# Patient Record
Sex: Female | Born: 1945 | Race: White | Hispanic: No | Smoking: Former smoker
Health system: Southern US, Community
[De-identification: ages and names within clinical notes are randomized; demographics above are authoritative.]

## PROBLEM LIST (undated history)

## (undated) DIAGNOSIS — E039 Hypothyroidism, unspecified: Secondary | ICD-10-CM

## (undated) DIAGNOSIS — E785 Hyperlipidemia, unspecified: Secondary | ICD-10-CM

## (undated) DIAGNOSIS — N8110 Cystocele, unspecified: Secondary | ICD-10-CM

## (undated) DIAGNOSIS — M654 Radial styloid tenosynovitis [de Quervain]: Secondary | ICD-10-CM

## (undated) DIAGNOSIS — J209 Acute bronchitis, unspecified: Secondary | ICD-10-CM

## (undated) DIAGNOSIS — I2510 Atherosclerotic heart disease of native coronary artery without angina pectoris: Secondary | ICD-10-CM

## (undated) DIAGNOSIS — F3289 Other specified depressive episodes: Secondary | ICD-10-CM

## (undated) DIAGNOSIS — R0989 Other specified symptoms and signs involving the circulatory and respiratory systems: Secondary | ICD-10-CM

## (undated) DIAGNOSIS — I10 Essential (primary) hypertension: Secondary | ICD-10-CM

## (undated) DIAGNOSIS — F411 Generalized anxiety disorder: Secondary | ICD-10-CM

## (undated) DIAGNOSIS — F329 Major depressive disorder, single episode, unspecified: Secondary | ICD-10-CM

## (undated) HISTORY — DX: Essential (primary) hypertension: I10

## (undated) HISTORY — DX: Major depressive disorder, single episode, unspecified: F32.9

## (undated) HISTORY — DX: Atherosclerotic heart disease of native coronary artery without angina pectoris: I25.10

## (undated) HISTORY — DX: Hyperlipidemia, unspecified: E78.5

## (undated) HISTORY — DX: Radial styloid tenosynovitis (de quervain): M65.4

## (undated) HISTORY — DX: Generalized anxiety disorder: F41.1

## (undated) HISTORY — DX: Other specified depressive episodes: F32.89

## (undated) HISTORY — DX: Other specified symptoms and signs involving the circulatory and respiratory systems: R09.89

## (undated) HISTORY — DX: Hypothyroidism, unspecified: E03.9

## (undated) HISTORY — DX: Cystocele, unspecified: N81.10

## (undated) HISTORY — PX: MOUTH SURGERY: SHX715

## (undated) HISTORY — DX: Acute bronchitis, unspecified: J20.9

---

## 1992-05-21 HISTORY — PX: TOTAL VAGINAL HYSTERECTOMY: SHX2548

## 1997-05-21 HISTORY — PX: NOSE SURGERY: SHX723

## 1998-04-07 ENCOUNTER — Ambulatory Visit (HOSPITAL_BASED_OUTPATIENT_CLINIC_OR_DEPARTMENT_OTHER): Admission: RE | Admit: 1998-04-07 | Discharge: 1998-04-07 | Payer: Self-pay | Admitting: *Deleted

## 1999-05-04 ENCOUNTER — Other Ambulatory Visit: Admission: RE | Admit: 1999-05-04 | Discharge: 1999-05-04 | Payer: Self-pay | Admitting: Family Medicine

## 2000-08-06 ENCOUNTER — Encounter: Payer: Self-pay | Admitting: *Deleted

## 2000-08-06 ENCOUNTER — Inpatient Hospital Stay (HOSPITAL_COMMUNITY): Admission: EM | Admit: 2000-08-06 | Discharge: 2000-08-08 | Payer: Self-pay | Admitting: Emergency Medicine

## 2000-09-12 ENCOUNTER — Other Ambulatory Visit: Admission: RE | Admit: 2000-09-12 | Discharge: 2000-09-12 | Payer: Self-pay | Admitting: Internal Medicine

## 2000-09-16 ENCOUNTER — Encounter: Admission: RE | Admit: 2000-09-16 | Discharge: 2000-09-16 | Payer: Self-pay | Admitting: Internal Medicine

## 2000-09-16 ENCOUNTER — Encounter: Payer: Self-pay | Admitting: Internal Medicine

## 2000-11-01 ENCOUNTER — Encounter: Payer: Self-pay | Admitting: Internal Medicine

## 2000-11-01 ENCOUNTER — Encounter: Admission: RE | Admit: 2000-11-01 | Discharge: 2000-11-01 | Payer: Self-pay | Admitting: Internal Medicine

## 2001-01-02 ENCOUNTER — Ambulatory Visit (HOSPITAL_COMMUNITY): Admission: RE | Admit: 2001-01-02 | Discharge: 2001-01-02 | Payer: Self-pay | Admitting: Obstetrics and Gynecology

## 2001-01-02 ENCOUNTER — Encounter: Payer: Self-pay | Admitting: Obstetrics and Gynecology

## 2001-01-27 ENCOUNTER — Encounter: Payer: Self-pay | Admitting: Obstetrics and Gynecology

## 2001-01-27 ENCOUNTER — Ambulatory Visit (HOSPITAL_COMMUNITY): Admission: RE | Admit: 2001-01-27 | Discharge: 2001-01-27 | Payer: Self-pay | Admitting: Obstetrics and Gynecology

## 2001-04-30 ENCOUNTER — Encounter: Payer: Self-pay | Admitting: Obstetrics and Gynecology

## 2001-04-30 ENCOUNTER — Ambulatory Visit (HOSPITAL_COMMUNITY): Admission: RE | Admit: 2001-04-30 | Discharge: 2001-04-30 | Payer: Self-pay | Admitting: Obstetrics and Gynecology

## 2004-07-27 ENCOUNTER — Ambulatory Visit: Payer: Self-pay | Admitting: *Deleted

## 2004-07-27 ENCOUNTER — Inpatient Hospital Stay (HOSPITAL_COMMUNITY): Admission: EM | Admit: 2004-07-27 | Discharge: 2004-07-29 | Payer: Self-pay | Admitting: Emergency Medicine

## 2004-08-01 ENCOUNTER — Ambulatory Visit: Payer: Self-pay | Admitting: Internal Medicine

## 2004-08-08 ENCOUNTER — Ambulatory Visit: Payer: Self-pay | Admitting: *Deleted

## 2004-09-08 ENCOUNTER — Ambulatory Visit: Payer: Self-pay | Admitting: *Deleted

## 2004-10-26 ENCOUNTER — Ambulatory Visit: Payer: Self-pay | Admitting: *Deleted

## 2004-12-06 ENCOUNTER — Ambulatory Visit: Payer: Self-pay | Admitting: *Deleted

## 2005-04-26 ENCOUNTER — Ambulatory Visit: Payer: Self-pay | Admitting: *Deleted

## 2005-06-12 ENCOUNTER — Ambulatory Visit: Payer: Self-pay | Admitting: *Deleted

## 2005-09-10 ENCOUNTER — Ambulatory Visit: Payer: Self-pay | Admitting: Internal Medicine

## 2005-09-17 ENCOUNTER — Ambulatory Visit: Payer: Self-pay | Admitting: Internal Medicine

## 2005-10-23 ENCOUNTER — Ambulatory Visit: Payer: Self-pay | Admitting: Internal Medicine

## 2005-11-26 ENCOUNTER — Ambulatory Visit: Payer: Self-pay | Admitting: Internal Medicine

## 2006-01-29 ENCOUNTER — Ambulatory Visit: Payer: Self-pay | Admitting: *Deleted

## 2006-01-30 ENCOUNTER — Ambulatory Visit: Payer: Self-pay | Admitting: *Deleted

## 2006-02-27 ENCOUNTER — Ambulatory Visit: Payer: Self-pay | Admitting: Internal Medicine

## 2006-07-12 ENCOUNTER — Ambulatory Visit: Payer: Self-pay | Admitting: Internal Medicine

## 2006-09-10 ENCOUNTER — Ambulatory Visit: Payer: Self-pay | Admitting: *Deleted

## 2006-09-10 LAB — CONVERTED CEMR LAB
ALT: 24 units/L (ref 0–40)
AST: 27 units/L (ref 0–37)
Albumin: 3.6 g/dL (ref 3.5–5.2)
Alkaline Phosphatase: 80 units/L (ref 39–117)
Bilirubin, Direct: 0.1 mg/dL (ref 0.0–0.3)
Cholesterol: 142 mg/dL (ref 0–200)
HDL: 52.5 mg/dL (ref >39.0)
LDL Cholesterol: 72 mg/dL (ref 0–99)
Total Bilirubin: 0.8 mg/dL (ref 0.3–1.2)
Total CHOL/HDL Ratio: 2.7
Total Protein: 6.4 g/dL (ref 6.0–8.3)
Triglycerides: 89 mg/dL (ref 0–149)
VLDL: 18 mg/dL (ref 0–40)

## 2006-09-17 ENCOUNTER — Ambulatory Visit: Payer: Self-pay | Admitting: *Deleted

## 2006-11-01 ENCOUNTER — Ambulatory Visit: Payer: Self-pay | Admitting: *Deleted

## 2006-11-01 LAB — CONVERTED CEMR LAB
ALT: 23 units/L (ref 0–40)
AST: 24 units/L (ref 0–37)
Albumin: 3.5 g/dL (ref 3.5–5.2)
Alkaline Phosphatase: 91 units/L (ref 39–117)
Bilirubin, Direct: 0.1 mg/dL (ref 0.0–0.3)
Cholesterol: 154 mg/dL (ref 0–200)
HDL: 49.9 mg/dL (ref >39.0)
LDL Cholesterol: 89 mg/dL (ref 0–99)
Total Bilirubin: 0.8 mg/dL (ref 0.3–1.2)
Total CHOL/HDL Ratio: 3.1
Total Protein: 6.3 g/dL (ref 6.0–8.3)
Triglycerides: 75 mg/dL (ref 0–149)
VLDL: 15 mg/dL (ref 0–40)

## 2007-01-21 ENCOUNTER — Ambulatory Visit: Payer: Self-pay | Admitting: Cardiovascular Disease

## 2007-01-21 LAB — CONVERTED CEMR LAB
ALT: 31 units/L (ref 0–35)
AST: 38 units/L — ABNORMAL HIGH (ref 0–37)
Albumin: 3.6 g/dL (ref 3.5–5.2)
Alkaline Phosphatase: 78 units/L (ref 39–117)
Bilirubin, Direct: 0.1 mg/dL (ref 0.0–0.3)
Cholesterol: 141 mg/dL (ref 0–200)
HDL: 39.2 mg/dL (ref >39.0)
LDL Cholesterol: 73 mg/dL (ref 0–99)
Total Bilirubin: 0.7 mg/dL (ref 0.3–1.2)
Total CHOL/HDL Ratio: 3.6
Total Protein: 6.5 g/dL (ref 6.0–8.3)
Triglycerides: 142 mg/dL (ref 0–149)
VLDL: 28 mg/dL (ref 0–40)

## 2007-01-23 ENCOUNTER — Ambulatory Visit: Payer: Self-pay | Admitting: Cardiovascular Disease

## 2007-01-31 ENCOUNTER — Ambulatory Visit: Payer: Self-pay

## 2007-02-11 ENCOUNTER — Ambulatory Visit: Payer: Self-pay | Admitting: Internal Medicine

## 2007-02-11 DIAGNOSIS — I25119 Atherosclerotic heart disease of native coronary artery with unspecified angina pectoris: Secondary | ICD-10-CM | POA: Insufficient documentation

## 2007-02-11 DIAGNOSIS — E785 Hyperlipidemia, unspecified: Secondary | ICD-10-CM | POA: Insufficient documentation

## 2007-02-11 DIAGNOSIS — M654 Radial styloid tenosynovitis [de Quervain]: Secondary | ICD-10-CM | POA: Insufficient documentation

## 2007-02-11 DIAGNOSIS — F419 Anxiety disorder, unspecified: Secondary | ICD-10-CM | POA: Insufficient documentation

## 2007-02-11 DIAGNOSIS — F32A Depression, unspecified: Secondary | ICD-10-CM | POA: Insufficient documentation

## 2007-02-11 DIAGNOSIS — E039 Hypothyroidism, unspecified: Secondary | ICD-10-CM | POA: Insufficient documentation

## 2007-02-11 DIAGNOSIS — I2510 Atherosclerotic heart disease of native coronary artery without angina pectoris: Secondary | ICD-10-CM | POA: Insufficient documentation

## 2007-02-11 DIAGNOSIS — I10 Essential (primary) hypertension: Secondary | ICD-10-CM | POA: Insufficient documentation

## 2007-02-11 DIAGNOSIS — R0989 Other specified symptoms and signs involving the circulatory and respiratory systems: Secondary | ICD-10-CM | POA: Insufficient documentation

## 2007-02-11 DIAGNOSIS — F329 Major depressive disorder, single episode, unspecified: Secondary | ICD-10-CM | POA: Insufficient documentation

## 2007-03-12 ENCOUNTER — Ambulatory Visit: Payer: Self-pay | Admitting: Internal Medicine

## 2007-03-12 ENCOUNTER — Encounter: Payer: Self-pay | Admitting: Internal Medicine

## 2007-03-24 ENCOUNTER — Telehealth: Payer: Self-pay | Admitting: Internal Medicine

## 2007-05-07 ENCOUNTER — Encounter: Payer: Self-pay | Admitting: Internal Medicine

## 2007-07-23 ENCOUNTER — Ambulatory Visit: Payer: Self-pay | Admitting: Cardiovascular Disease

## 2007-07-23 LAB — CONVERTED CEMR LAB
ALT: 26 units/L (ref 0–35)
AST: 28 units/L (ref 0–37)
Albumin: 3.5 g/dL (ref 3.5–5.2)
Alkaline Phosphatase: 75 units/L (ref 39–117)
Bilirubin, Direct: 0.1 mg/dL (ref 0.0–0.3)
Cholesterol: 148 mg/dL (ref 0–200)
HDL: 45.3 mg/dL (ref >39.0)
LDL Cholesterol: 68 mg/dL (ref 0–99)
Total Bilirubin: 0.7 mg/dL (ref 0.3–1.2)
Total CHOL/HDL Ratio: 3.3
Total Protein: 6.6 g/dL (ref 6.0–8.3)
Triglycerides: 176 mg/dL — ABNORMAL HIGH (ref 0–149)
VLDL: 35 mg/dL (ref 0–40)

## 2007-07-29 ENCOUNTER — Ambulatory Visit: Payer: Self-pay | Admitting: Cardiovascular Disease

## 2007-09-19 ENCOUNTER — Ambulatory Visit: Payer: Self-pay | Admitting: Endocrinology

## 2007-09-19 DIAGNOSIS — J209 Acute bronchitis, unspecified: Secondary | ICD-10-CM | POA: Insufficient documentation

## 2007-09-22 ENCOUNTER — Ambulatory Visit: Payer: Self-pay | Admitting: Internal Medicine

## 2008-03-15 ENCOUNTER — Ambulatory Visit: Payer: Self-pay | Admitting: Internal Medicine

## 2008-03-15 LAB — CONVERTED CEMR LAB
ALT: 27 units/L (ref 0–35)
AST: 35 units/L (ref 0–37)
Albumin: 3.7 g/dL (ref 3.5–5.2)
Alkaline Phosphatase: 92 units/L (ref 39–117)
BUN: 11 mg/dL (ref 6–23)
Basophils Absolute: 0 10*3/uL (ref 0.0–0.1)
Basophils Relative: 0.4 % (ref 0.0–3.0)
Bilirubin Urine: NEGATIVE
Bilirubin, Direct: 0.2 mg/dL (ref 0.0–0.3)
CO2: 30 meq/L (ref 19–32)
Calcium: 8.9 mg/dL (ref 8.4–10.5)
Chloride: 106 meq/L (ref 96–112)
Cholesterol: 141 mg/dL (ref 0–200)
Creatinine, Ser: 1 mg/dL (ref 0.4–1.2)
Eosinophils Absolute: 0.2 10*3/uL (ref 0.0–0.7)
Eosinophils Relative: 3.8 % (ref 0.0–5.0)
GFR calc Af Amer: 72 mL/min
GFR calc non Af Amer: 59 mL/min
Glucose, Bld: 107 mg/dL — ABNORMAL HIGH (ref 70–99)
HCT: 37.4 % (ref 36.0–46.0)
HDL: 49 mg/dL (ref >39.0)
Hemoglobin, Urine: NEGATIVE
Hemoglobin: 13 g/dL (ref 12.0–15.0)
Ketones, ur: NEGATIVE mg/dL
LDL Cholesterol: 75 mg/dL (ref 0–99)
Leukocytes, UA: NEGATIVE
Lymphocytes Relative: 27.9 % (ref 12.0–46.0)
MCHC: 34.7 g/dL (ref 30.0–36.0)
MCV: 92.9 fL (ref 78.0–100.0)
Monocytes Absolute: 0.4 10*3/uL (ref 0.1–1.0)
Monocytes Relative: 7.7 % (ref 3.0–12.0)
Neutro Abs: 3.5 10*3/uL (ref 1.4–7.7)
Neutrophils Relative %: 60.2 % (ref 43.0–77.0)
Nitrite: NEGATIVE
Platelets: 210 10*3/uL (ref 150–400)
Potassium: 4 meq/L (ref 3.5–5.1)
RBC: 4.03 M/uL (ref 3.87–5.11)
RDW: 11.7 % (ref 11.5–14.6)
Sodium: 142 meq/L (ref 135–145)
Specific Gravity, Urine: 1.025 (ref 1.000–1.03)
TSH: 2.61 microintl units/mL (ref 0.35–5.50)
Total Bilirubin: 0.7 mg/dL (ref 0.3–1.2)
Total CHOL/HDL Ratio: 2.9
Total Protein, Urine: NEGATIVE mg/dL
Total Protein: 6.5 g/dL (ref 6.0–8.3)
Triglycerides: 83 mg/dL (ref 0–149)
Urine Glucose: NEGATIVE mg/dL
Urobilinogen, UA: 0.2 (ref 0.0–1.0)
VLDL: 17 mg/dL (ref 0–40)
WBC: 5.7 10*3/uL (ref 4.5–10.5)
pH: 6 (ref 5.0–8.0)

## 2008-03-22 ENCOUNTER — Ambulatory Visit: Payer: Self-pay | Admitting: Internal Medicine

## 2008-03-30 ENCOUNTER — Telehealth: Payer: Self-pay | Admitting: Internal Medicine

## 2008-09-07 ENCOUNTER — Encounter: Payer: Self-pay | Admitting: Cardiovascular Disease

## 2008-09-07 ENCOUNTER — Ambulatory Visit: Payer: Self-pay | Admitting: Cardiovascular Disease

## 2008-09-21 ENCOUNTER — Ambulatory Visit: Payer: Self-pay | Admitting: Internal Medicine

## 2008-09-21 LAB — CONVERTED CEMR LAB
ALT: 24 units/L (ref 0–35)
AST: 26 units/L (ref 0–37)
Albumin: 3.7 g/dL (ref 3.5–5.2)
Alkaline Phosphatase: 81 units/L (ref 39–117)
BUN: 17 mg/dL (ref 6–23)
Basophils Absolute: 0 10*3/uL (ref 0.0–0.1)
Basophils Relative: 0.6 % (ref 0.0–3.0)
Bilirubin Urine: NEGATIVE
Bilirubin, Direct: 0.1 mg/dL (ref 0.0–0.3)
CO2: 31 meq/L (ref 19–32)
Calcium: 9.2 mg/dL (ref 8.4–10.5)
Chloride: 105 meq/L (ref 96–112)
Cholesterol: 149 mg/dL (ref 0–200)
Creatinine, Ser: 1 mg/dL (ref 0.4–1.2)
Eosinophils Absolute: 0.2 10*3/uL (ref 0.0–0.7)
Eosinophils Relative: 4.3 % (ref 0.0–5.0)
GFR calc non Af Amer: 59.13 mL/min (ref >60)
Glucose, Bld: 97 mg/dL (ref 70–99)
HCT: 38.6 % (ref 36.0–46.0)
HDL: 46.9 mg/dL (ref >39.00)
Hemoglobin, Urine: NEGATIVE
Hemoglobin: 13 g/dL (ref 12.0–15.0)
Ketones, ur: NEGATIVE mg/dL
LDL Cholesterol: 80 mg/dL (ref 0–99)
Leukocytes, UA: NEGATIVE
Lymphocytes Relative: 27 % (ref 12.0–46.0)
Lymphs Abs: 1.6 10*3/uL (ref 0.7–4.0)
MCHC: 33.6 g/dL (ref 30.0–36.0)
MCV: 95.7 fL (ref 78.0–100.0)
Monocytes Absolute: 0.4 10*3/uL (ref 0.1–1.0)
Monocytes Relative: 7.6 % (ref 3.0–12.0)
Neutro Abs: 3.6 10*3/uL (ref 1.4–7.7)
Neutrophils Relative %: 60.5 % (ref 43.0–77.0)
Nitrite: NEGATIVE
Platelets: 200 10*3/uL (ref 150.0–400.0)
Potassium: 4 meq/L (ref 3.5–5.1)
RBC: 4.04 M/uL (ref 3.87–5.11)
RDW: 11.5 % (ref 11.5–14.6)
Sodium: 141 meq/L (ref 135–145)
Specific Gravity, Urine: >=1.03 (ref 1.000–1.030)
TSH: 2.81 microintl units/mL (ref 0.35–5.50)
Total Bilirubin: 0.7 mg/dL (ref 0.3–1.2)
Total CHOL/HDL Ratio: 3
Total Protein: 6.5 g/dL (ref 6.0–8.3)
Triglycerides: 112 mg/dL (ref 0.0–149.0)
Urine Glucose: NEGATIVE mg/dL
Urobilinogen, UA: 0.2 (ref 0.0–1.0)
VLDL: 22.4 mg/dL (ref 0.0–40.0)
WBC: 5.8 10*3/uL (ref 4.5–10.5)
pH: 5.5 (ref 5.0–8.0)

## 2008-09-28 ENCOUNTER — Ambulatory Visit: Payer: Self-pay | Admitting: Internal Medicine

## 2008-10-05 ENCOUNTER — Ambulatory Visit: Payer: Self-pay

## 2008-10-05 ENCOUNTER — Encounter: Payer: Self-pay | Admitting: Cardiovascular Disease

## 2009-03-21 ENCOUNTER — Ambulatory Visit: Payer: Self-pay | Admitting: Internal Medicine

## 2009-03-21 LAB — CONVERTED CEMR LAB
ALT: 32 units/L (ref 0–35)
AST: 36 units/L (ref 0–37)
Albumin: 3.9 g/dL (ref 3.5–5.2)
Alkaline Phosphatase: 89 units/L (ref 39–117)
BUN: 11 mg/dL (ref 6–23)
Basophils Absolute: 0.1 10*3/uL (ref 0.0–0.1)
Basophils Relative: 1 % (ref 0.0–3.0)
Bilirubin Urine: NEGATIVE
Bilirubin, Direct: 0.1 mg/dL (ref 0.0–0.3)
CO2: 28 meq/L (ref 19–32)
Calcium: 8.8 mg/dL (ref 8.4–10.5)
Chloride: 107 meq/L (ref 96–112)
Cholesterol: 144 mg/dL (ref 0–200)
Creatinine, Ser: 1 mg/dL (ref 0.4–1.2)
Eosinophils Absolute: 0.2 10*3/uL (ref 0.0–0.7)
Eosinophils Relative: 3.8 % (ref 0.0–5.0)
GFR calc non Af Amer: 59.03 mL/min (ref >60)
Glucose, Bld: 104 mg/dL — ABNORMAL HIGH (ref 70–99)
HCT: 38.4 % (ref 36.0–46.0)
HDL: 50.9 mg/dL (ref >39.00)
Hemoglobin, Urine: NEGATIVE
Hemoglobin: 13.2 g/dL (ref 12.0–15.0)
Ketones, ur: NEGATIVE mg/dL
LDL Cholesterol: 76 mg/dL (ref 0–99)
Leukocytes, UA: NEGATIVE
Lymphocytes Relative: 23.5 % (ref 12.0–46.0)
Lymphs Abs: 1.3 10*3/uL (ref 0.7–4.0)
MCHC: 34.5 g/dL (ref 30.0–36.0)
MCV: 97 fL (ref 78.0–100.0)
Monocytes Absolute: 0.4 10*3/uL (ref 0.1–1.0)
Monocytes Relative: 6.5 % (ref 3.0–12.0)
Neutro Abs: 3.7 10*3/uL (ref 1.4–7.7)
Neutrophils Relative %: 65.2 % (ref 43.0–77.0)
Nitrite: NEGATIVE
Platelets: 213 10*3/uL (ref 150.0–400.0)
Potassium: 3.9 meq/L (ref 3.5–5.1)
RBC: 3.96 M/uL (ref 3.87–5.11)
RDW: 11.7 % (ref 11.5–14.6)
Sodium: 142 meq/L (ref 135–145)
Specific Gravity, Urine: 1.025 (ref 1.000–1.030)
TSH: 2.46 microintl units/mL (ref 0.35–5.50)
Total Bilirubin: 0.6 mg/dL (ref 0.3–1.2)
Total CHOL/HDL Ratio: 3
Total Protein, Urine: NEGATIVE mg/dL
Total Protein: 7.1 g/dL (ref 6.0–8.3)
Triglycerides: 86 mg/dL (ref 0.0–149.0)
Urine Glucose: NEGATIVE mg/dL
Urobilinogen, UA: 0.2 (ref 0.0–1.0)
VLDL: 17.2 mg/dL (ref 0.0–40.0)
WBC: 5.7 10*3/uL (ref 4.5–10.5)
pH: 5.5 (ref 5.0–8.0)

## 2009-03-23 ENCOUNTER — Encounter: Payer: Self-pay | Admitting: Internal Medicine

## 2009-03-28 ENCOUNTER — Ambulatory Visit: Payer: Self-pay | Admitting: Internal Medicine

## 2009-07-26 ENCOUNTER — Encounter: Payer: Self-pay | Admitting: Cardiovascular Disease

## 2009-08-18 ENCOUNTER — Telehealth: Payer: Self-pay | Admitting: Cardiovascular Disease

## 2009-08-19 ENCOUNTER — Ambulatory Visit: Payer: Self-pay | Admitting: Cardiovascular Disease

## 2009-08-22 ENCOUNTER — Ambulatory Visit (HOSPITAL_COMMUNITY): Admission: RE | Admit: 2009-08-22 | Discharge: 2009-08-23 | Payer: Self-pay | Admitting: Cardiovascular Disease

## 2009-08-22 ENCOUNTER — Ambulatory Visit: Payer: Self-pay | Admitting: Cardiology

## 2009-08-23 ENCOUNTER — Encounter: Payer: Self-pay | Admitting: Cardiovascular Disease

## 2009-08-24 ENCOUNTER — Telehealth: Payer: Self-pay | Admitting: Cardiovascular Disease

## 2009-08-26 ENCOUNTER — Encounter: Payer: Self-pay | Admitting: Cardiovascular Disease

## 2009-09-02 ENCOUNTER — Ambulatory Visit: Payer: Self-pay | Admitting: Cardiovascular Disease

## 2009-09-12 ENCOUNTER — Telehealth: Payer: Self-pay | Admitting: Cardiovascular Disease

## 2009-09-29 ENCOUNTER — Ambulatory Visit: Payer: Self-pay | Admitting: Internal Medicine

## 2009-09-29 LAB — CONVERTED CEMR LAB
BUN: 18 mg/dL (ref 6–23)
CO2: 30 meq/L (ref 19–32)
Calcium: 9.1 mg/dL (ref 8.4–10.5)
Chloride: 103 meq/L (ref 96–112)
Creatinine, Ser: 0.8 mg/dL (ref 0.4–1.2)
GFR calc non Af Amer: 72.07 mL/min (ref >60)
Glucose, Bld: 90 mg/dL (ref 70–99)
Potassium: 3.9 meq/L (ref 3.5–5.1)
Sodium: 141 meq/L (ref 135–145)

## 2009-10-03 ENCOUNTER — Ambulatory Visit: Payer: Self-pay | Admitting: Internal Medicine

## 2009-11-03 ENCOUNTER — Ambulatory Visit: Payer: Self-pay | Admitting: Internal Medicine

## 2009-11-03 DIAGNOSIS — M545 Low back pain, unspecified: Secondary | ICD-10-CM | POA: Insufficient documentation

## 2009-11-03 DIAGNOSIS — R5381 Other malaise: Secondary | ICD-10-CM | POA: Insufficient documentation

## 2009-11-03 DIAGNOSIS — R5383 Other fatigue: Secondary | ICD-10-CM

## 2009-11-03 LAB — CONVERTED CEMR LAB
ALT: 24 units/L (ref 0–35)
AST: 23 units/L (ref 0–37)
Albumin: 4.5 g/dL (ref 3.5–5.2)
Alkaline Phosphatase: 109 units/L (ref 39–117)
BUN: 15 mg/dL (ref 6–23)
Basophils Absolute: 0 10*3/uL (ref 0.0–0.1)
Basophils Relative: 0.3 % (ref 0.0–3.0)
Bilirubin, Direct: 0.1 mg/dL (ref 0.0–0.3)
CO2: 33 meq/L — ABNORMAL HIGH (ref 19–32)
CRP, High Sensitivity: 2.29 (ref 0.00–5.00)
Calcium: 9.4 mg/dL (ref 8.4–10.5)
Chloride: 107 meq/L (ref 96–112)
Creatinine, Ser: 1 mg/dL (ref 0.4–1.2)
Eosinophils Absolute: 0.1 10*3/uL (ref 0.0–0.7)
Eosinophils Relative: 1.9 % (ref 0.0–5.0)
GFR calc non Af Amer: 62.51 mL/min (ref >60)
Glucose, Bld: 98 mg/dL (ref 70–99)
HCT: 38 % (ref 36.0–46.0)
Hemoglobin: 13.2 g/dL (ref 12.0–15.0)
Lymphocytes Relative: 17.8 % (ref 12.0–46.0)
Lymphs Abs: 1.2 10*3/uL (ref 0.7–4.0)
MCHC: 34.7 g/dL (ref 30.0–36.0)
MCV: 91.7 fL (ref 78.0–100.0)
Monocytes Absolute: 0.4 10*3/uL (ref 0.1–1.0)
Monocytes Relative: 5.3 % (ref 3.0–12.0)
Neutro Abs: 5.1 10*3/uL (ref 1.4–7.7)
Neutrophils Relative %: 74.7 % (ref 43.0–77.0)
Platelets: 252 10*3/uL (ref 150.0–400.0)
Potassium: 4 meq/L (ref 3.5–5.1)
RBC: 4.15 M/uL (ref 3.87–5.11)
RDW: 12.6 % (ref 11.5–14.6)
Sed Rate: 27 mm/hr — ABNORMAL HIGH (ref 0–22)
Sodium: 145 meq/L (ref 135–145)
TSH: 0.93 microintl units/mL (ref 0.35–5.50)
Total Bilirubin: 0.7 mg/dL (ref 0.3–1.2)
Total CK: 65 units/L (ref 7–177)
Total Protein: 7.4 g/dL (ref 6.0–8.3)
Vit D, 25-Hydroxy: 41 ng/mL (ref 30–89)
WBC: 6.8 10*3/uL (ref 4.5–10.5)

## 2009-11-08 ENCOUNTER — Telehealth: Payer: Self-pay | Admitting: Cardiovascular Disease

## 2009-11-14 ENCOUNTER — Telehealth: Payer: Self-pay | Admitting: Internal Medicine

## 2009-11-14 ENCOUNTER — Ambulatory Visit: Payer: Self-pay | Admitting: Internal Medicine

## 2009-11-15 ENCOUNTER — Telehealth: Payer: Self-pay | Admitting: Cardiovascular Disease

## 2009-11-22 ENCOUNTER — Ambulatory Visit: Payer: Self-pay | Admitting: Internal Medicine

## 2009-11-22 DIAGNOSIS — R209 Unspecified disturbances of skin sensation: Secondary | ICD-10-CM | POA: Insufficient documentation

## 2009-11-22 DIAGNOSIS — R269 Unspecified abnormalities of gait and mobility: Secondary | ICD-10-CM | POA: Insufficient documentation

## 2009-11-22 LAB — CONVERTED CEMR LAB
Aldolase: 5.4 units/L (ref <=8.1)
Bilirubin Urine: NEGATIVE
Hemoglobin, Urine: NEGATIVE
Ketones, ur: NEGATIVE mg/dL
Leukocytes, UA: NEGATIVE
Nitrite: NEGATIVE
Sed Rate: 29 mm/hr — ABNORMAL HIGH (ref 0–22)
Specific Gravity, Urine: 1.02 (ref 1.000–1.030)
Total Protein, Urine: NEGATIVE mg/dL
Urine Glucose: NEGATIVE mg/dL
Urobilinogen, UA: 0.2 (ref 0.0–1.0)
Vitamin B-12: 380 pg/mL (ref 211–911)
pH: 6 (ref 5.0–8.0)

## 2009-11-23 ENCOUNTER — Telehealth: Payer: Self-pay | Admitting: Cardiovascular Disease

## 2009-11-24 ENCOUNTER — Ambulatory Visit: Payer: Self-pay | Admitting: Internal Medicine

## 2009-12-07 ENCOUNTER — Telehealth: Payer: Self-pay | Admitting: Internal Medicine

## 2009-12-20 ENCOUNTER — Ambulatory Visit: Payer: Self-pay | Admitting: Internal Medicine

## 2009-12-21 LAB — CONVERTED CEMR LAB
Cholesterol: 266 mg/dL — ABNORMAL HIGH (ref 0–200)
Direct LDL: 189.2 mg/dL
HDL: 51.9 mg/dL (ref >39.00)
Total CHOL/HDL Ratio: 5
Triglycerides: 184 mg/dL — ABNORMAL HIGH (ref 0.0–149.0)
VLDL: 36.8 mg/dL (ref 0.0–40.0)

## 2009-12-27 ENCOUNTER — Ambulatory Visit: Payer: Self-pay | Admitting: Internal Medicine

## 2009-12-28 ENCOUNTER — Telehealth: Payer: Self-pay | Admitting: Cardiovascular Disease

## 2010-01-03 ENCOUNTER — Telehealth: Payer: Self-pay | Admitting: Internal Medicine

## 2010-01-04 ENCOUNTER — Ambulatory Visit: Payer: Self-pay | Admitting: Internal Medicine

## 2010-01-12 ENCOUNTER — Telehealth: Payer: Self-pay | Admitting: Internal Medicine

## 2010-01-13 ENCOUNTER — Telehealth: Payer: Self-pay | Admitting: Internal Medicine

## 2010-01-17 ENCOUNTER — Telehealth: Payer: Self-pay | Admitting: Internal Medicine

## 2010-01-20 ENCOUNTER — Telehealth: Payer: Self-pay | Admitting: Internal Medicine

## 2010-03-13 ENCOUNTER — Telehealth: Payer: Self-pay | Admitting: Internal Medicine

## 2010-03-28 ENCOUNTER — Encounter: Payer: Self-pay | Admitting: Internal Medicine

## 2010-03-28 LAB — HM MAMMOGRAPHY: HM Mammogram: NORMAL

## 2010-03-29 ENCOUNTER — Encounter: Payer: Self-pay | Admitting: Internal Medicine

## 2010-05-12 ENCOUNTER — Ambulatory Visit: Payer: Self-pay | Admitting: Internal Medicine

## 2010-05-19 ENCOUNTER — Ambulatory Visit: Payer: Self-pay | Admitting: Internal Medicine

## 2010-05-25 ENCOUNTER — Telehealth: Payer: Self-pay | Admitting: Internal Medicine

## 2010-06-14 ENCOUNTER — Other Ambulatory Visit: Payer: Self-pay | Admitting: Internal Medicine

## 2010-06-14 LAB — BASIC METABOLIC PANEL
BUN: 16 mg/dL (ref 6–23)
CO2: 28 mEq/L (ref 19–32)
Calcium: 9 mg/dL (ref 8.4–10.5)
Chloride: 105 mEq/L (ref 96–112)
Creatinine, Ser: 1 mg/dL (ref 0.4–1.2)
GFR: 56.21 mL/min — ABNORMAL LOW (ref >60.00)
Glucose, Bld: 90 mg/dL (ref 70–99)
Potassium: 4.2 mEq/L (ref 3.5–5.1)
Sodium: 140 mEq/L (ref 135–145)

## 2010-06-14 LAB — LIPID PANEL
Cholesterol: 264 mg/dL — ABNORMAL HIGH (ref 0–200)
HDL: 54.7 mg/dL (ref >39.00)
Total CHOL/HDL Ratio: 5
Triglycerides: 254 mg/dL — ABNORMAL HIGH (ref 0.0–149.0)
VLDL: 50.8 mg/dL — ABNORMAL HIGH (ref 0.0–40.0)

## 2010-06-14 LAB — HEPATIC FUNCTION PANEL
ALT: 16 U/L (ref 0–35)
AST: 24 U/L (ref 0–37)
Albumin: 3.7 g/dL (ref 3.5–5.2)
Alkaline Phosphatase: 79 U/L (ref 39–117)
Bilirubin, Direct: 0.1 mg/dL (ref 0.0–0.3)
Total Bilirubin: 0.4 mg/dL (ref 0.3–1.2)
Total Protein: 6.4 g/dL (ref 6.0–8.3)

## 2010-06-14 LAB — CBC WITH DIFFERENTIAL/PLATELET
Basophils Absolute: 0 10*3/uL (ref 0.0–0.1)
Basophils Relative: 0.8 % (ref 0.0–3.0)
Eosinophils Absolute: 0.3 10*3/uL (ref 0.0–0.7)
Eosinophils Relative: 4.4 % (ref 0.0–5.0)
HCT: 35.7 % — ABNORMAL LOW (ref 36.0–46.0)
Hemoglobin: 12.3 g/dL (ref 12.0–15.0)
Lymphocytes Relative: 31.9 % (ref 12.0–46.0)
Lymphs Abs: 1.9 10*3/uL (ref 0.7–4.0)
MCHC: 34.5 g/dL (ref 30.0–36.0)
MCV: 93.5 fl (ref 78.0–100.0)
Monocytes Absolute: 0.4 10*3/uL (ref 0.1–1.0)
Monocytes Relative: 6.8 % (ref 3.0–12.0)
Neutro Abs: 3.3 10*3/uL (ref 1.4–7.7)
Neutrophils Relative %: 56.1 % (ref 43.0–77.0)
Platelets: 235 10*3/uL (ref 150.0–400.0)
RBC: 3.82 Mil/uL — ABNORMAL LOW (ref 3.87–5.11)
RDW: 12.8 % (ref 11.5–14.6)
WBC: 5.8 10*3/uL (ref 4.5–10.5)

## 2010-06-14 LAB — URINALYSIS
Bilirubin Urine: NEGATIVE
Hemoglobin, Urine: NEGATIVE
Ketones, ur: NEGATIVE
Leukocytes, UA: NEGATIVE
Nitrite: NEGATIVE
Specific Gravity, Urine: <=1.005 (ref 1.000–1.030)
Total Protein, Urine: NEGATIVE
Urine Glucose: NEGATIVE
Urobilinogen, UA: 0.2 (ref 0.0–1.0)
pH: 6 (ref 5.0–8.0)

## 2010-06-14 LAB — LDL CHOLESTEROL, DIRECT: Direct LDL: 183.3 mg/dL

## 2010-06-14 LAB — TSH: TSH: 0.9 u[IU]/mL (ref 0.35–5.50)

## 2010-06-14 LAB — VITAMIN B12: Vitamin B-12: 382 pg/mL (ref 211–911)

## 2010-06-14 LAB — CK: Total CK: 51 U/L (ref 7–177)

## 2010-06-15 ENCOUNTER — Telehealth: Payer: Self-pay | Admitting: Cardiovascular Disease

## 2010-06-20 NOTE — Progress Notes (Signed)
Summary: request zpak  Phone Note Call from Patient Call back at Home Phone [phone] Call back at Work Phone [phone] Call back at [phone] till 5pm, home after that   Caller: Patient Call For: [name] Garter MD Summary of Call: Pt has hoarsness, headache, coughing up phlegm--yellowish-green color. No fever. Pt requesting Zpak be called in to her pharmacy. Please advise. [facility] [address]. Initial call taken by: Verdell Face,  January 20, 2010 3:44 PM  Follow-up for Phone Call        ok Zpac - feel better! Follow-up by: [name] Garter MD,  January 20, 2010 5:59 PM  Additional Follow-up for Phone Call Additional follow up Details #1::        Left detailed vm on hm # Additional Follow-up by: Lamar Sprinkles, CMA,  January 20, 2010 6:04 PM    New/Updated Medications: ZITHROMAX Z-PAK 250 MG TABS (AZITHROMYCIN) as dirrected Prescriptions: ZITHROMAX Z-PAK 250 MG TABS (AZITHROMYCIN) as dirrected  #1 x 0   Entered and Authorized by:   [name] Garter MD   Signed by:   [name] Garter MD on 01/20/2010   Method used:   Electronically to        [facility]  [phone]* (retail)       [address]       [place], [place]  [address]       Ph: [phone] or [phone]       Fax: [phone]   RxID:   [id number]

## 2010-06-20 NOTE — Miscellaneous (Signed)
Summary: Mammogram 2011  Clinical Lists Changes  Observations: Added new observation of MAMMOGRAM: normal (03/28/2010 16:53)      Preventive Care Screening  Mammogram:    Date:  03/28/2010    Results:  normal

## 2010-06-20 NOTE — Progress Notes (Signed)
Summary: Change cholesterol Med  Phone Note Call from Patient Call back at Home Phone [phone] Call back at Work Phone [phone]   Caller: Patient Reason for Call: Talk to Nurse Summary of Call: pt having lost of control of legs, saw PCP(Dr Yetta Barre) on 6/16, took her off of simvastatin, had to see PCP(Dr Norins) again yesterday for the same problem, wants to go back on simvastatin, pt wants to speak with dr about this call pt at home if it after 5pm Initial call taken by: Migdalia Dk,  November 15, 2009 9:40 AM  Follow-up for Phone Call        I spoke with the pt and Dr Debby Bud would like Dr Excell Seltzer to make further recommendations about the pt's cholesterol medication.  The pt did speak with Dr Corinda Gubler last night and he recommended that she not take Simvastatin and call Dr Excell Seltzer.  The pt is also being referred to Neurology for her symptoms.  The pt has taken Crestor, Lipitor, Pravachol and Vytorin in the past.  The pt said she did not remember having a reaction to Lipitor.  The pt said that Dr Yetta Barre recommended a medication called  Livalo and would like to know if this is a medication the pt could try.  I will forward this information to Dr Excell Seltzer for review. Follow-up by: [name], RN, BSN,  November 15, 2009 6:09 PM  Additional Follow-up for Phone Call Additional follow up Details #1::        Left messages on home and work telephones. Reviewed situation and symptoms that may be related to her statin. Recommend statin holiday for 2 weeks and then trial of lipitor 10 mg wlith f/u lipids and lft's 12 weeks after initiating lipitor Additional Follow-up by: Norva Karvonen, MD,  November 18, 2009 6:09 PM    Additional Follow-up for Phone Call Additional follow up Details #2::    returning call, please call back @ work, after 5 call home, Migdalia Dk  November 22, 2009 8:58 AM   Left message to call back. [name], RN, BSN  November 22, 2009 9:26 AM  I spoke with the pt and she  has not taken a statin drug since June 15th.  I sent in a Rx for Lipitor 10mg  and the pt will have labs rechecked on 02/13/10.  Follow-up by: [name], RN, BSN,  November 22, 2009 9:45 AM  New/Updated Medications: LIPITOR 10 MG TABS (ATORVASTATIN CALCIUM) take one tablet by mouth once a day Prescriptions: LIPITOR 10 MG TABS (ATORVASTATIN CALCIUM) take one tablet by mouth once a day  #90 x 3   Entered by:   [name], RN, BSN   Authorized by:   Norva Karvonen, MD   Signed by:   [name], RN, BSN on 11/22/2009   Method used:   Electronically to        [facility]  [phone]* (retail)       [address]       [place], [place]  [address]       Ph: [phone] or [phone]       Fax: [phone]   RxID:   [id number]

## 2010-06-20 NOTE — Progress Notes (Signed)
Summary: B-12 nasal spray  Phone Note Call from Patient   Summary of Call: Patient was given B-12 nasal spray and would like to know if she should continue this med and how long/how often. She notes that she is very pleased with it. Per patient her legs are much better. Please advise. Initial call taken by: Lucious Groves CMA,  January 12, 2010 1:21 PM  Follow-up for Phone Call        ok to cont pick up the card Follow-up by: Tresa Garter MD,  January 12, 2010 2:12 PM  Additional Follow-up for Phone Call Additional follow up Details #1::        left vm on hm # to pick up rx & discount card, it is up front Additional Follow-up by: Lamar Sprinkles, CMA,  January 12, 2010 5:21 PM    New/Updated Medications: NASCOBAL 500 MCG/0.1ML SOLN (CYANOCOBALAMIN) 1 spr in one nostril q 1 wk Prescriptions: NASCOBAL 500 MCG/0.1ML SOLN (CYANOCOBALAMIN) 1 spr in one nostril q 1 wk  #1 x 12   Entered and Authorized by:   Tresa Garter MD   Signed by:   Tresa Garter MD on 01/12/2010   Method used:   Print then Give to Patient   RxID:   [id number]

## 2010-06-20 NOTE — Progress Notes (Signed)
Summary: Sore throat, rx question  Medications Added VYTORIN 10-40 MG TABS (EZETIMIBE-SIMVASTATIN)        Phone Note Call from Patient Call back at [facility] Phone [phone]   Summary of Call: Pt last seen by dr Jonny Ruiz for swollen throat with white spots. Given zpak with refill. Took zpak and throat is not completely better, says it feels like a puss pocket on the back of her throat. Should she take the refill for the zpak or does she need a different antibiotic? Initial call taken by: Lamar Sprinkles,  March 24, 2007 4:49 PM  Follow-up for Phone Call        Z pack should last x 10d If worse OV If better -let go, use  OTC Rx Follow-up by: Tresa Garter MD,  March 24, 2007 5:00 PM  Additional Follow-up for Phone Call Additional follow up Details #1::        Pt informed left vm Additional Follow-up by: Lamar Sprinkles,  March 25, 2007 2:32 PM    New/Updated Medications: VYTORIN 10-40 MG TABS (EZETIMIBE-SIMVASTATIN)

## 2010-06-20 NOTE — Progress Notes (Signed)
Summary: LABS  ---- Converted from flag ---- ---- 12/07/2009 10:54 AM, Verdell Face wrote: [name] [id number] / 10-13-2045 Pt states she would like labs before her next visit with dr Posey Rea in august to check cholesterol as she was told to hold off taking cholesterol meds. Please advise.  [phone] or  [phone] and pls message on vm  Cheryl ------------------------------  Phone Note From Other Clinic   Summary of Call: Please advise.  Initial call taken by: Lamar Sprinkles, CMA,  December 07, 2009 5:24 PM  Follow-up for Phone Call        ok lipids in august 272.0 Follow-up by: Tresa Garter MD,  December 07, 2009 11:06 PM  Additional Follow-up for Phone Call Additional follow up Details #1::        Left vm for pt Additional Follow-up by: Lamar Sprinkles, CMA,  December 08, 2009 11:18 AM

## 2010-06-20 NOTE — Assessment & Plan Note (Signed)
Summary: 2 wk f/u #/cd   Vital Signs:  Patient profile:   65 year old female Height:      68 inches (172.72 cm) Weight:      163 pounds (74.09 kg) BMI:     24.87 Temp:     98.4 degrees F (36.89 degrees C) oral Pulse rate:   86 / minute Pulse rhythm:   regular Resp:     16 per minute BP sitting:   120 / 80  (left arm) Cuff size:   regular  Vitals Entered By: Lanier Prude, Beverly Gust) (November 22, 2009 3:44 PM) CC: f/u  Comments seen 6-27 by Dr. Debby Bud   Primary Care Provider:  Tresa Garter MD  CC:  f/u .  History of Present Illness: C/o leg weak episodes which continued till 6/27: the knees would buckle and legs give out... She had seen Dr Yetta Barre and Dr Debby Bud; she had labs and LS spine X ray. Her Simvastatin was stopped. She had a similar episode on Crestor before  Current Medications (verified): 1)  Procardia Xl 30 Mg  Tb24 (Nifedipine) .... Take 1 By Mouth Once Daily 2)  Lopressor 50 Mg Tabs (Metoprolol Tartrate) .Marland Kitchen.. 1 By Mouth Three Times A Day 3)  Lasix 40 Mg Tabs (Furosemide) .Marland Kitchen.. 1 By Mouth  Two Times A Day 4)  Aspir-Low 81 Mg Tbec (Aspirin) .... Once Daily 5)  Ativan 0.5 Mg  Tabs (Lorazepam) .Marland Kitchen.. 1 By Mouth At Bedtime 6)  Levothroid 125 Mcg Tabs (Levothyroxine Sodium) .Marland Kitchen.. 1 Po Once Daily 7)  Doxepin Hcl 50 Mg  Caps (Doxepin Hcl) .... 3 By Mouth At Bedtime 8)  Depo-Estradiol 5 Mg/ml  Oil (Estradiol Cypionate) .... Marland Kitchen25 Mg Inj Q 18-25 Days 9)  Nitrolingual 0.4 Mg/spray Soln (Nitroglycerin) .... One Spray Under Tongue Every 5 Minutes As Needed For Chest Pain---May Repeat Times Three 10)  Tranxene-T 15 Mg  Tabs (Clorazepate Dipotassium) .Marland Kitchen.. 1 By Mouth Three Times A Day Prn 11)  Vitamin D3 1000 Unit Tabs (Cholecalciferol) .... Once Daily 12)  Klor-Con M20 20 Meq Cr-Tabs (Potassium Chloride Crys Cr) .Marland Kitchen.. 1 By Mouth Bid 13)  Lipitor 10 Mg Tabs (Atorvastatin Calcium) .... Take One Tablet By Mouth Once A Day  Allergies (verified): 1)  ! Amoxicillin 2)  ! *  Antihistamines 3)  ! * Bellergal 4)  ! Cipro 5)  ! Codeine 6)  ! Darvocet 7)  ! Epinephrine 8)  ! Imdur 9)  ! Lidocaine 10)  ! Morphine 11)  ! Tetracycline 12)  ! Benadryl 13)  ! 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 ANXIETY (ICD-300.00)    Past Surgical History: Last updated: 03/12/2007 none  Social History: Last updated: 11/03/2009 Single Former Smoker Occupation: [employer] operational officer Alcohol use-no Regular exercise-yes  Review of Systems  The patient denies fever, chest pain, syncope, abdominal pain, melena, and hematochezia.         No LBP, no LE pains  Physical Exam  General:  overweight white female who is emotionally stressed but in no medical distress Eyes:  pupils equal, pupils round, corneas and lenses clear, and no injection.   Mouth:  Oral mucosa  and oropharynx without lesions or exudates.  Teeth in good repair. Neck:  supple, full ROM, no masses, no thyromegaly, no JVD, normal carotid upstroke, no carotid bruits, no cervical lymphadenopathy, and no neck tenderness.   Lungs:  normal respiratory effort and normal breath sounds.   Heart:  normal rate and regular rhythm.   Abdomen:  Bowel sounds positive,abdomen soft and non-tender without masses, organomegaly or hernias noted. Msk:  back exam: nl stand, gait, toe/heel walk, step-up to exam table, SLR sitting, DTR's at patella and achilles tendon. No CVAT. Nl sensation to light touch and pin-prick Neurologic:  alert & oriented X3,  cranial nerves II-XII intact, strength normal in all extremities, sensation intact to light touch, sensation intact to pinprick, gait normal, and DTRs symmetrical and normal.   Skin:  turgor normal and color normal.   Psych:  Oriented X3, normally interactive, good eye contact, and moderately anxious.     Impression & Recommendations:  Problem # 1:  WEAKNESS (ICD-780.79) poss due to a statin Assessment New Cont to hold Zocor See "Patient Instructions".  Orders: TLB-B12, Serum-Total ONLY [phone]) Radiology Referral (Radiology) CT brain   Problem # 2:  GAIT DISTURBANCE (ICD-781.2) related to #1 Assessment: New  Orders: TLB-Udip ONLY (81003-UDIP) T-Aldolase (54098-11914) TLB-Sedimentation Rate (ESR) (85652-ESR) Radiology Referral (Radiology)  Problem # 3:  PARESTHESIA (ICD-782.0) Assessment: New  Orders: Radiology Referral (Radiology)  Problem # 4:  HYPERLIPIDEMIA (ICD-272.4) Assessment: Comment Only  Her updated medication list for this problem includes:    Lipitor 10 Mg Tabs (Atorvastatin calcium) .Marland Kitchen... Take one tablet by mouth once a day do not start yet  Complete Medication List: 1)  Procardia Xl 30 Mg Tb24 (Nifedipine) .... Take 1 by mouth once daily 2)  Lopressor 50 Mg Tabs (Metoprolol tartrate) .Marland Kitchen.. 1 by mouth three times a day 3)  Lasix 40 Mg Tabs (Furosemide) .Marland Kitchen.. 1 by mouth  two times a day 4)  Aspir-low 81 Mg Tbec (Aspirin) .... Once daily 5)  Ativan 0.5 Mg Tabs (Lorazepam) .Marland Kitchen.. 1 by mouth at bedtime 6)  Levothroid 125 Mcg Tabs (Levothyroxine sodium) .Marland Kitchen.. 1 po once daily 7)  Doxepin Hcl 50 Mg Caps (Doxepin hcl) .... 3 by mouth at bedtime 8)  Depo-estradiol 5 Mg/ml Oil (Estradiol cypionate) .... Marland Kitchen25 mg inj q 18-25 days 9)  Nitrolingual 0.4 Mg/spray Soln (Nitroglycerin) .... One spray under tongue every 5 minutes as needed for chest pain---may repeat times three 10)  Tranxene-t 15 Mg Tabs (Clorazepate dipotassium) .Marland Kitchen.. 1 by mouth three times a day prn 11)   Vitamin D3 1000 Unit Tabs (Cholecalciferol) .... Once daily 12)  Klor-con M20 20 Meq Cr-tabs (Potassium chloride crys cr) .Marland Kitchen.. 1 by mouth bid 13)  Lipitor 10 Mg Tabs (Atorvastatin calcium) .... Take one tablet by mouth once a day  Patient Instructions: 1)  Please schedule a follow-up appointment in 1 month 2)  Hold cholesterol meds 3)  Call if you are not better in a reasonable amount of time or if worse

## 2010-06-20 NOTE — Cardiovascular Report (Signed)
Summary: Pre Op Orders  Pre Op Orders   Imported By: Roderic Ovens 08/26/2009 16:09:06  _____________________________________________________________________  External Attachment:    Type:   Image     Comment:   External Document

## 2010-06-20 NOTE — Assessment & Plan Note (Signed)
Summary: COUGH/DIZZY/JK   Vital Signs:  Patient Profile:   65 Years Old Female Height:     68 inches Weight:      165.0 pounds Temp:     97.0 degrees F oral Pulse rate:   72 / minute BP sitting:   130 / 66  (left arm) Cuff size:   regular  Vitals Entered By: Orlan Leavens (Sep 19, 2007 1:53 PM)                 Visit Type:  Acute Visit  Chief Complaint:  ONGOING COUGH COUGHING UP LITTLE GREENISH PHLEGM/ DIZZINESS X'S 3 DAYS.  History of Present Illness: few days slightly prod-quality cough, with associated lightheadedness sensation in the head.     Current Allergies: ! AMOXICILLIN ! * ANTIHISTAMINES ! * BELLERGAL ! CIPRO ! CODEINE ! CRESTOR (ROSUVASTATIN CALCIUM) ! DARVOCET ! EPINEPHRINE ! IMDUR ! LIDOCAINE ! LIPITOR ! MORPHINE ! TETRACYCLINE  Past Medical History:    Reviewed history from 02/11/2007 and no changes required:       Anxiety       Coronary artery disease       Depression       Hyperlipidemia       Hypertension       Hypothyroidism     Review of Systems  The patient denies fever.         denies n/v and nasal congestion   Physical Exam  General:     well developed, well nourished, in no acute distress Head:     normocephalic  Eyes:     no gross abnormality of the eyes.  no periorbital swelling, and no scleral icterus  Ears:     TMs intact and clear with normal canals and hearing Mouth:     no lesion of the oral mucosa.  no erythema.  mucous membranes are moist  Neck:     no masses, thyromegaly, or abnormal cervical nodes Lungs:     clear to auscultation.  no respiratory distress  Heart:     regular rate and rhythm, S1, S2 without murmurs, rubs, gallops, or clicks Msk:     no deformity or scoliosis noted with normal posture and gait Pulses:     no carotid bruit  Neurologic:     no focal deficits, CN II-XII grossly intact  Skin:     normal texture and temp.  no rash.  not diaphoretic  Psych:     alert and  cooperative; normal mood and affect; normal attention span and concentration    Impression & Recommendations:  Problem # 1:  ACUTE BRONCHITIS (ICD-466.0)  Orders: Est. Patient Level III (38756)   Medications Added to Medication List This Visit: 1)  Zithromax 500 Mg Tabs (Azithromycin) .... Qd 2)  Tessalon Perles 100 Mg Caps (Benzonatate) .... Three times a day as needed cough   Patient Instructions: 1)  zithromax 500/d x3d 2)  tesslaon 100 three times a day prn    Prescriptions: ZITHROMAX 500 MG  TABS (AZITHROMYCIN) qd  #3 x 0   Entered and Authorized by:   Minus Breeding MD   Signed by:   Minus Breeding MD on 09/19/2007   Method used:   Electronically sent to ...       [facility]  [address]  [phone]*       [address]       [place], [place]  [address]  Ph: [phone] or [phone]       Fax: [phone]   RxID:   [id number] TESSALON PERLES 100 MG  CAPS (BENZONATATE) three times a day as needed cough  #30 x 0   Entered and Authorized by:   Minus Breeding MD   Signed by:   Minus Breeding MD on 09/19/2007   Method used:   Electronically sent to ...       [facility]  [address]  [phone]*       [address]       [place], [place]  [address]       Ph: [phone] or [phone]       Fax: [phone]   RxID:   [id number]  ]

## 2010-06-20 NOTE — Assessment & Plan Note (Signed)
Summary: 6 MO ROV /NWS $50   Vital Signs:  Patient profile:   65 year old female Height:      68 inches Weight:      162 pounds Temp:     973 degrees F Pulse rate:   61 / minute BP sitting:   110 / 70  (left arm)  Vitals Entered By: Tora Perches (Sep 28, 2008 4:21 PM) CC: f/u Is Patient Diabetic? No   Primary Care Provider:  Tresa Garter MD  CC:  f/u.  History of Present Illness: The patient presents for a follow up of hypertension, CAD, hyperlipidemia.   Current Medications (verified): 1)  Procardia Xl 30 Mg  Tb24 (Nifedipine) .... Take 1 By Mouth Once Daily 2)  Lopressor 50 Mg Tabs (Metoprolol Tartrate) .Marland Kitchen.. 1 By Mouth Three Times A Day 3)  Lasix 40 Mg Tabs (Furosemide) .Marland Kitchen.. 1 By Mouth  Two Times A Day 4)  Aspir-Low 81 Mg Tbec (Aspirin) .... Once Daily 5)  Ativan 0.5 Mg  Tabs (Lorazepam) .Marland Kitchen.. 1 By Mouth At Bedtime 6)  Levothroid 125 Mcg Tabs (Levothyroxine Sodium) .Marland Kitchen.. 1 Po Once Daily 7)  Doxepin Hcl 50 Mg  Caps (Doxepin Hcl) .... 3 By Mouth At Bedtime 8)  Potassium Chloride 20 Meq  Pack (Potassium Chloride) .Marland Kitchen.. 1 Po Two Times A Day 9)  Simvastatin 80 Mg Tabs (Simvastatin) .Marland Kitchen.. 1 Po Once Daily 10)  Depo-Estradiol 5 Mg/ml  Oil (Estradiol Cypionate) .... Marland Kitchen75 Mg Inj Q 18 D 11)  Nitrolingual Duo Pack 0.4 Mg/spray Tl Soln (Nitroglycerin) .... As Needed As Dirr. 12)  Tranxene-T 15 Mg  Tabs (Clorazepate Dipotassium) .Marland Kitchen.. 1 By Mouth Three Times A Day Prn 13)  Vitamin D3 400 Unit Tabs (Cholecalciferol) .... Take 1 By Mouth Once Daily  Allergies: 1)  ! Amoxicillin 2)  ! * Antihistamines 3)  ! * Bellergal 4)  ! Cipro 5)  ! Codeine 6)  ! Crestor (Rosuvastatin Calcium) 7)  ! Darvocet 8)  ! Epinephrine 9)  ! Imdur 10)  ! Lidocaine 11)  ! Lipitor 12)  ! Morphine 13)  ! Tetracycline 14)  ! Benadryl  Past History:  Past Medical History:    ACUTE BRONCHITIS (ICD-466.0)    FAMILY HISTORY OF CAD FEMALE 1ST DEGREE RELATIVE <50 (ICD-V17.3)    CAROTID BRUIT  (ICD-785.9)    DE QUERVAIN'S TENOSYNOVITIS (ICD-727.04)    HYPOTHYROIDISM (ICD-244.9)    HYPERTENSION (ICD-401.9)    HYPERLIPIDEMIA (ICD-272.4)    DEPRESSION (ICD-311)    CORONARY ARTERY DISEASE (ICD-414.00)    ANXIETY (ICD-300.00)          (09/06/2008)  Past Surgical History:    none (03/12/2007)  Family History:    Family History Hypertension    Family History of CAD Female 1st degree relative <50     (03/12/2007)  Social History:    Single    Former Smoker    Occupation: Diplomatic Services operational officer    Alcohol use-no     (03/12/2007)   Impression & Recommendations:  Problem # 1:  HYPOTHYROIDISM (ICD-244.9) Assessment Unchanged  Her updated medication list for this problem includes:    Levothroid 125 Mcg Tabs (Levothyroxine sodium) .Marland Kitchen... 1 po once daily  Problem # 2:  HYPERTENSION (ICD-401.9) Assessment: Unchanged  Her updated medication list for this problem includes:    Procardia Xl 30 Mg Tb24 (Nifedipine) .Marland Kitchen... Take 1 by mouth once daily    Lopressor 50 Mg Tabs (Metoprolol tartrate) .Marland Kitchen... 1 by mouth three times a day  Lasix 40 Mg Tabs (Furosemide) .Marland Kitchen... 1 by mouth  two times a day  Problem # 3:  HYPERLIPIDEMIA (ICD-272.4) Assessment: Unchanged The labs were reviewed with the patient.  Her updated medication list for this problem includes:    Simvastatin 80 Mg Tabs (Simvastatin) .Marland Kitchen... 1 po once daily  Problem # 4:  DEPRESSION (ICD-311) Assessment: Unchanged  Her updated medication list for this problem includes:    Ativan 0.5 Mg Tabs (Lorazepam) .Marland Kitchen... 1 by mouth at bedtime    Doxepin Hcl 50 Mg Caps (Doxepin hcl) .Marland KitchenMarland KitchenMarland KitchenMarland Kitchen 3 by mouth at bedtime    Tranxene-t 15 Mg Tabs (Clorazepate dipotassium) .Marland Kitchen... 1 by mouth three times a day prn  Problem # 5:  CORONARY ARTERY DISEASE (ICD-414.00) Assessment: Unchanged  Her updated medication list for this problem includes:    Procardia Xl 30 Mg Tb24 (Nifedipine) .Marland Kitchen... Take 1 by mouth once daily    Lopressor 50 Mg Tabs (Metoprolol  tartrate) .Marland Kitchen... 1 by mouth three times a day    Lasix 40 Mg Tabs (Furosemide) .Marland Kitchen... 1 by mouth  two times a day    Aspir-low 81 Mg Tbec (Aspirin) ..... Once daily    Nitrolingual Duo Pack 0.4 Mg/spray Tl Soln (Nitroglycerin) .Marland Kitchen... As needed as dirr.  Complete Medication List: 1)  Procardia Xl 30 Mg Tb24 (Nifedipine) .... Take 1 by mouth once daily 2)  Lopressor 50 Mg Tabs (Metoprolol tartrate) .Marland Kitchen.. 1 by mouth three times a day 3)  Lasix 40 Mg Tabs (Furosemide) .Marland Kitchen.. 1 by mouth  two times a day 4)  Aspir-low 81 Mg Tbec (Aspirin) .... Once daily 5)  Ativan 0.5 Mg Tabs (Lorazepam) .Marland Kitchen.. 1 by mouth at bedtime 6)  Levothroid 125 Mcg Tabs (Levothyroxine sodium) .Marland Kitchen.. 1 po once daily 7)  Doxepin Hcl 50 Mg Caps (Doxepin hcl) .... 3 by mouth at bedtime 8)  Potassium Chloride 20 Meq Pack (Potassium chloride) .Marland Kitchen.. 1 po two times a day 9)  Simvastatin 80 Mg Tabs (Simvastatin) .Marland Kitchen.. 1 po once daily 10)  Depo-estradiol 5 Mg/ml Oil (Estradiol cypionate) .... Marland Kitchen75 mg inj q 18 d 11)  Nitrolingual Duo Pack 0.4 Mg/spray Tl Soln (Nitroglycerin) .... As needed as dirr. 12)  Tranxene-t 15 Mg Tabs (Clorazepate dipotassium) .Marland Kitchen.. 1 by mouth three times a day prn 13)  Vitamin D3 400 Unit Tabs (Cholecalciferol) .... Take 1 by mouth once daily  Patient Instructions: 1)  Please schedule a follow-up appointment in 12 months well w/labs. Prescriptions: DEPO-ESTRADIOL 5 MG/ML  OIL (ESTRADIOL CYPIONATE) .75 mg inj q 18 d  #qs x 3 mo x 4   Entered and Authorized by:   Tresa Garter MD   Signed by:   Tresa Garter MD on 09/28/2008   Method used:   Print then Give to Patient   RxID:   [id number] TRANXENE-T 15 MG  TABS (CLORAZEPATE DIPOTASSIUM) 1 by mouth three times a day prn  #270 x 1   Entered and Authorized by:   Tresa Garter MD   Signed by:   Tresa Garter MD on 09/28/2008   Method used:   Print then Give to Patient   RxID:   [id number] NITROLINGUAL DUO PACK 0.4 MG/SPRAY TL SOLN  (NITROGLYCERIN) as needed as dirr.  #1 x 6   Entered and Authorized by:   Tresa Garter MD   Signed by:   Tresa Garter MD on 09/28/2008   Method used:   Print then Give to Patient   RxID:  [id number] SIMVASTATIN 80 MG TABS (SIMVASTATIN) 1 po once daily  #90 x 4   Entered and Authorized by:   Tresa Garter MD   Signed by:   Tresa Garter MD on 09/28/2008   Method used:   Print then Give to Patient   RxID:   [id number] POTASSIUM CHLORIDE 20 MEQ  PACK (POTASSIUM CHLORIDE) 1 po two times a day  #180 x 4   Entered and Authorized by:   Tresa Garter MD   Signed by:   Tresa Garter MD on 09/28/2008   Method used:   Print then Give to Patient   RxID:   [id number] DOXEPIN HCL 50 MG  CAPS (DOXEPIN HCL) 3 by mouth at bedtime  #90 x 4   Entered and Authorized by:   Tresa Garter MD   Signed by:   Tresa Garter MD on 09/28/2008   Method used:   Print then Give to Patient   RxID:   [id number] LEVOTHROID 125 MCG TABS (LEVOTHYROXINE SODIUM) 1 po once daily  #90 x 4   Entered and Authorized by:   Tresa Garter MD   Signed by:   Tresa Garter MD on 09/28/2008   Method used:   Print then Give to Patient   RxID:   [id number] ATIVAN 0.5 MG  TABS (LORAZEPAM) 1 by mouth at bedtime  #90 x 1   Entered and Authorized by:   Tresa Garter MD   Signed by:   Tresa Garter MD on 09/28/2008   Method used:   Print then Give to Patient   RxID:   [id number] LASIX 40 MG TABS (FUROSEMIDE) 1 by mouth  two times a day  #90 x 4   Entered and Authorized by:   Tresa Garter MD   Signed by:   Tresa Garter MD on 09/28/2008   Method used:   Print then Give to Patient   RxID:   [id number] LOPRESSOR 50 MG TABS (METOPROLOL TARTRATE) 1 by mouth three times a day  #90 x 4   Entered and Authorized by:   Tresa Garter MD   Signed by:   Tresa Garter MD on 09/28/2008   Method used:   Print  then Give to Patient   RxID:   [id number] PROCARDIA XL 30 MG  TB24 (NIFEDIPINE) Take 1 by mouth once daily  #90 x 4   Entered and Authorized by:   Tresa Garter MD   Signed by:   Tresa Garter MD on 09/28/2008   Method used:   Print then Give to Patient   RxID:   [id number]

## 2010-06-20 NOTE — Assessment & Plan Note (Signed)
    Acute Visit History: Musculoskeletal HPI   Patient complains of pain localized to: right wrist pain, recurrent Onset of symptoms: several weeks ago Pain is described as: dull annoying Worse with: movement Better with: rest History of repetitive motion (Y,N): y History of overuse (Y,N): y History of bleeding problems or intolerance of NSAID's (Y,N):   History of trauma (Y,N):      When:  Description of injury:   Past history of similar problem (Y,N):  Past history of surgery for similar problem(Y,N):  What worked last time for the problem:   Associated symptoms (Y,N)  Fever:  Redness:  Swelling:  Rash:  Back Pain:  Eye symptoms:   Injury is work related (Y,N):  Occupation:  Associate Professor:         Past Medical History:    Anxiety    Coronary artery disease    Depression    Hyperlipidemia    Hypertension    Hypothyroidism   Family History:    Family History Hypertension  Social History:    Single    Former Smoker    Occupation: Diplomatic Services operational officer   Risk Factors:  Tobacco use:  quit    Physical Exam  General:     Well-developed,well-nourished,in no acute distress; alert,appropriate and cooperative throughout examination Neck:     No deformities, masses, or tenderness noted. Lungs:     Normal respiratory effort, chest expands symmetrically. Lungs are clear to auscultation, no crackles or wheezes. Heart:     Normal rate and regular rhythm. S1 and S2 normal without gallop, murmur, click, rub or other extra sounds. Msk:     joint tenderness.   Extremities:     No clubbing, cyanosis, edema, or deformity noted with normal full range of motion of all joints.     Wrist/Hand Exam  General:    Well-developed, well-nourished, in no acute distress; alert and oriented x 3.    Motor:    decreased L-hand grip.    Wrist Exam:    Left:    Inspection:  Abnormal       Location:  left radial head    Stability:  stable    Swelling:  left radial  head    Impression & Recommendations:  Problem # 1:  DE QUERVAIN'S TENOSYNOVITIS (ICD-727.04) Assessment: Deteriorated will inject with cortisone.  Given flector patches, Lidoderm patches.  Complete Medication List: 1)  Procardia Xl 30 Mg Tb24 (Nifedipine) .... Qd 2)  Lopressor 50 Mg Tabs (Metoprolol tartrate) .... Tid 3)  Lasix 40 Mg Tabs (Furosemide) .... Bid 4)  Aspir-low 81 Mg Tbec (Aspirin) .... Qd 5)  Ativan 0.5 Mg Tabs (Lorazepam) .... Qhs 6)  Levothroid 125 Mcg Tabs (Levothyroxine sodium) .... Qd 7)  Doxepin Hcl 50 Mg Caps (Doxepin hcl) .... Tid 8)  Potassium Chloride 20 Meq Pack (Potassium chloride) .... Bid 9)  Simvastatin 80 Mg Tabs (Simvastatin) .... Qd 10)  Depo-estradiol 5 Mg/ml Oil (Estradiol cypionate)   Patient Instructions: 1)  Wrist care. 2)  Please schedule a follow-up appointment in 3 months.    ]  Procedure Note  Injections: The patient complains of pain. Consent signed: yes    Region: medial    Comment: the left abductor boluses longus injection.  The area was prepped with Betadine and alcohol and injected with 10 mg of Depo-Medrol and .5 cc of  lidocaine with epinephrine. tolerated well.  Complications none. immediate pain relief achieved.

## 2010-06-20 NOTE — Letter (Signed)
Summary: Return To Work  [employer], Main Office  [address]   [place], [place] [address]   Phone: [phone]  Fax: [phone]    08/26/2009  TO: WHOM IT MAY CONCERN   RE: [name] [address] [place]   The above named individual is under my medical care and may return to work on: August 29, 2009 with no restrictions.   If you have any further questions or need additional information, please call.     Sincerely,    Micheline Chapman, MD [name], RN, BSN

## 2010-06-20 NOTE — Assessment & Plan Note (Signed)
Summary: DR AVP PT/NO SLOT-LOWER BACK HEAVINESS & LEGS WEAKNESS STC   Vital Signs:  Patient profile:   65 year old female Height:      68 inches Weight:      164 pounds BMI:     25.03 O2 Sat:      97 % on Room air Temp:     97.7 degrees F oral Pulse rate:   73 / minute Pulse rhythm:   regular Resp:     16 per minute BP sitting:   140 / 80  (left arm) Cuff size:   large  Vitals Entered By: Rock Nephew CMA (November 03, 2009 9:54 AM)  Nutrition Counseling: Patient's BMI is greater than 25 and therefore counseled on weight management options.  O2 Flow:  Room air CC: leg weakness from knees down, low mid back pressure that radiates down leg, Hypertension Management Is Patient Diabetic? No Pain Assessment Patient in pain? no        Primary Care Provider:  Tresa Garter MD  CC:  leg weakness from knees down, low mid back pressure that radiates down leg, and Hypertension Management.  History of Present Illness: New to me she complains of heaviness in her lower back that radiates into her legs with bilateral leg weakness and aching in the AM for several weeks now. The leg weakness is so severe that she nearly collapses.  Hypertension History:      She denies headache, chest pain, palpitations, dyspnea with exertion, orthopnea, PND, peripheral edema, neurologic problems, syncope, and side effects from treatment.  She notes no problems with any antihypertensive medication side effects.        Positive major cardiovascular risk factors include female age 25 years old or older, hyperlipidemia, and hypertension.  Negative major cardiovascular risk factors include no history of diabetes, negative family history for ischemic heart disease, and non-tobacco-user status.        Positive history for target organ damage include ASHD (either angina/prior MI/prior CABG).  Further assessment for target organ damage reveals no history of cardiac end-organ damage (CHF/LVH), stroke/TIA,  peripheral vascular disease, renal insufficiency, or hypertensive retinopathy.     Preventive Screening-Counseling & Management  Alcohol-Tobacco     Alcohol drinks/day: 0     Smoking Status: quit  Caffeine-Diet-Exercise     Does Patient Exercise: yes  Hep-HIV-STD-Contraception     Hepatitis Risk: no risk noted     HIV Risk: no risk noted     STD Risk: no risk noted  Clinical Review Panels:  CBC   WBC:  5.7 (03/21/2009)   RBC:  3.96 (03/21/2009)   Hgb:  13.2 (03/21/2009)   Hct:  38.4 (03/21/2009)   Platelets:  213.0 (03/21/2009)   MCV  97.0 (03/21/2009)   MCHC  34.5 (03/21/2009)   RDW  11.7 (03/21/2009)   PMN:  65.2 (03/21/2009)   Lymphs:  23.5 (03/21/2009)   Monos:  6.5 (03/21/2009)   Eosinophils:  3.8 (03/21/2009)   Basophil:  1.0 (03/21/2009)  Complete Metabolic Panel   Glucose:  90 (09/29/2009)   Sodium:  141 (09/29/2009)   Potassium:  3.9 (09/29/2009)   Chloride:  103 (09/29/2009)   CO2:  30 (09/29/2009)   BUN:  18 (09/29/2009)   Creatinine:  0.8 (09/29/2009)   Albumin:  3.9 (03/21/2009)   Total Protein:  7.1 (03/21/2009)   Calcium:  9.1 (09/29/2009)   Total Bili:  0.6 (03/21/2009)   Alk Phos:  89 (03/21/2009)   SGPT (ALT):  32 (  03/21/2009)   SGOT (AST):  36 (03/21/2009)   Medications Prior to Update: 1)  Procardia Xl 30 Mg  Tb24 (Nifedipine) .... Take 1 By Mouth Once Daily 2)  Lopressor 50 Mg Tabs (Metoprolol Tartrate) .Marland Kitchen.. 1 By Mouth Three Times A Day 3)  Lasix 40 Mg Tabs (Furosemide) .Marland Kitchen.. 1 By Mouth  Two Times A Day 4)  Aspir-Low 81 Mg Tbec (Aspirin) .... Once Daily 5)  Ativan 0.5 Mg  Tabs (Lorazepam) .Marland Kitchen.. 1 By Mouth At Bedtime 6)  Levothroid 125 Mcg Tabs (Levothyroxine Sodium) .Marland Kitchen.. 1 Po Once Daily 7)  Doxepin Hcl 50 Mg  Caps (Doxepin Hcl) .... 3 By Mouth At Bedtime 8)  Simvastatin 80 Mg Tabs (Simvastatin) .Marland Kitchen.. 1 Po Once Daily 9)  Depo-Estradiol 5 Mg/ml  Oil (Estradiol Cypionate) .... Marland Kitchen25 Mg Inj Q 18-25 Days 10)  Nitrolingual 0.4 Mg/spray Soln  (Nitroglycerin) .... One Spray Under Tongue Every 5 Minutes As Needed For Chest Pain---May Repeat Times Three 11)  Tranxene-T 15 Mg  Tabs (Clorazepate Dipotassium) .Marland Kitchen.. 1 By Mouth Three Times A Day Prn 12)  Vitamin D3 1000 Unit Tabs (Cholecalciferol) .... Once Daily 13)  Klor-Con M20 20 Meq Cr-Tabs (Potassium Chloride Crys Cr) .Marland Kitchen.. 1 By Mouth Bid  Current Medications (verified): 1)  Procardia Xl 30 Mg  Tb24 (Nifedipine) .... Take 1 By Mouth Once Daily 2)  Lopressor 50 Mg Tabs (Metoprolol Tartrate) .Marland Kitchen.. 1 By Mouth Three Times A Day 3)  Lasix 40 Mg Tabs (Furosemide) .Marland Kitchen.. 1 By Mouth  Two Times A Day 4)  Aspir-Low 81 Mg Tbec (Aspirin) .... Once Daily 5)  Ativan 0.5 Mg  Tabs (Lorazepam) .Marland Kitchen.. 1 By Mouth At Bedtime 6)  Levothroid 125 Mcg Tabs (Levothyroxine Sodium) .Marland Kitchen.. 1 Po Once Daily 7)  Doxepin Hcl 50 Mg  Caps (Doxepin Hcl) .... 3 By Mouth At Bedtime 8)  Depo-Estradiol 5 Mg/ml  Oil (Estradiol Cypionate) .... Marland Kitchen25 Mg Inj Q 18-25 Days 9)  Nitrolingual 0.4 Mg/spray Soln (Nitroglycerin) .... One Spray Under Tongue Every 5 Minutes As Needed For Chest Pain---May Repeat Times Three 10)  Tranxene-T 15 Mg  Tabs (Clorazepate Dipotassium) .Marland Kitchen.. 1 By Mouth Three Times A Day Prn 11)  Vitamin D3 1000 Unit Tabs (Cholecalciferol) .... Once Daily 12)  Klor-Con M20 20 Meq Cr-Tabs (Potassium Chloride Crys Cr) .Marland Kitchen.. 1 By Mouth Bid  Allergies (verified): 1)  ! Amoxicillin 2)  ! * Antihistamines 3)  ! * Bellergal 4)  ! Cipro 5)  ! Codeine 6)  ! Crestor (Rosuvastatin Calcium) 7)  ! Darvocet 8)  ! Epinephrine 9)  ! Imdur 10)  ! Lidocaine 11)  ! Lipitor 12)  ! Morphine 13)  ! Tetracycline 14)  ! Benadryl 15)  ! Lidocaine 16)  Xanax (Alprazolam)  Past History:  Past Medical History: Last updated: 08/19/2009 ACUTE BRONCHITIS (ICD-466.0) FAMILY HISTORY OF CAD FEMALE 1ST DEGREE RELATIVE <50 (ICD-V17.3) CAROTID BRUIT (ICD-785.9) DE QUERVAIN'S TENOSYNOVITIS (ICD-727.04) HYPOTHYROIDISM (ICD-244.9) HYPERTENSION  (ICD-401.9) HYPERLIPIDEMIA (ICD-272.4) DEPRESSION (ICD-311) CORONARY ARTERY DISEASE (ICD-414.00) s/p multiple angioplasty procedures in the 90's/early 2000's. ANXIETY (ICD-300.00)    Past Surgical History: Last updated: 03/12/2007 none  Family History: Last updated: 03/12/2007 Family History Hypertension Family History of CAD Female 1st degree relative <50  Social History: Last updated: 11/03/2009 Single Former Smoker Occupation: Diplomatic Services operational officer Alcohol use-no Regular exercise-yes  Risk Factors: Alcohol Use: 0 (11/03/2009) Exercise: yes (11/03/2009)  Risk Factors: Smoking Status: quit (11/03/2009)  Family History: Reviewed history from 03/12/2007 and no changes required. Family History Hypertension Family  History of CAD Female 1st degree relative <50  Social History: Reviewed history from 03/12/2007 and no changes required. Single Former Smoker Occupation: Diplomatic Services operational officer Alcohol use-no Regular exercise-yes Hepatitis Risk:  no risk noted HIV Risk:  no risk noted STD Risk:  no risk noted Does Patient Exercise:  yes  Review of Systems  The patient denies anorexia, fever, weight loss, weight gain, peripheral edema, prolonged cough, and abdominal pain.   MS:  Complains of loss of strength, low back pain, muscle aches, muscle weakness, and stiffness; denies joint pain, joint redness, joint swelling, mid back pain, muscle, and thoracic pain. Neuro:  Complains of weakness; denies brief paralysis, difficulty with concentration, disturbances in coordination, falling down, headaches, memory loss, numbness, poor balance, seizures, sensation of room spinning, tingling, tremors, and visual disturbances.  Physical Exam  General:  alert, well-developed, well-nourished, well-hydrated, appropriate dress, normal appearance, healthy-appearing, cooperative to examination, and good hygiene.   Head:  normocephalic, atraumatic, no abnormalities observed, and no abnormalities palpated.   Eyes:   vision grossly intact, no injection, and no nystagmus.   Ears:  R ear normal and L ear normal.   Mouth:  Oral mucosa and oropharynx without lesions or exudates.  Teeth in good repair. Neck:  supple, full ROM, no masses, no thyromegaly, no JVD, normal carotid upstroke, no carotid bruits, no cervical lymphadenopathy, and no neck tenderness.   Lungs:  normal respiratory effort, no intercostal retractions, no accessory muscle use, normal breath sounds, no dullness, no fremitus, no crackles, and no wheezes.   Heart:  normal rate, regular rhythm, no murmur, no gallop, no rub, and no JVD.   Abdomen:  soft, non-tender, normal bowel sounds, no distention, no masses, no guarding, no rigidity, no rebound tenderness, no abdominal hernia, no inguinal hernia, no hepatomegaly, and no splenomegaly.   Msk:  normal ROM, no joint tenderness, no joint swelling, no joint warmth, no redness over joints, no joint deformities, no joint instability, no crepitation, and no muscle atrophy.   Pulses:  R and L carotid,radial,femoral,dorsalis pedis and posterior tibial pulses are full and equal bilaterally Extremities:  No clubbing, cyanosis, edema, or deformity noted with normal full range of motion of all joints.   Neurologic:  No cranial nerve deficits noted. Station and gait are normal. Plantar reflexes are down-going bilaterally. DTRs are symmetrical throughout. Sensory, motor and coordinative functions appear intact. Skin:  turgor normal, no rashes, no suspicious lesions, no ecchymoses, no petechiae, no purpura, no ulcerations, no edema, excessive tan, and solar damage.   Cervical Nodes:  no anterior cervical adenopathy and no posterior cervical adenopathy.   Axillary Nodes:  no R axillary adenopathy and no L axillary adenopathy.   Inguinal Nodes:  no R inguinal adenopathy and no L inguinal adenopathy.   Psych:  Cognition and judgment appear intact. Alert and cooperative with normal attention span and concentration. No  apparent delusions, illusions, hallucinations   Detailed Back/Spine Exam  General:    Well-developed, well-nourished, in no acute distress; alert and oriented x 3.    Gait:    Normal heel-toe gait pattern bilaterally.    Lumbosacral Exam:  Inspection-deformity:    Normal Palpation-spinal tenderness:  Normal Range of Motion:    Forward Flexion:   90 degrees    Hyperextension:   35 degrees    Right Lateral Bend:   35 degrees    Left Lateral Bend:   35 degrees Squatting:  normal Lying Straight Leg Raise:    Right:  negative  Left:  negative Sitting Straight Leg Raise:    Right:  negative    Left:  negative Reverse Straight Leg Raise:    Right:  negative    Left:  negative Sciatic Notch:    There is no sciatic notch tenderness. Toe Walking:    Right:  normal    Left:  normal Heel Walking:    Right:  normal    Left:  normal   Impression & Recommendations:  Problem # 1:  BACK PAIN, LUMBAR (ICD-724.2)  Her updated medication list for this problem includes:    Aspir-low 81 Mg Tbec (Aspirin) ..... Once daily  Orders: T-Lumbar Spine Complete, 5 Views (71110TC)  Problem # 2:  WEAKNESS (ICD-780.79) Assessment: New i think this is due to simva but will check TSH, Vitamin D level, lytes, CBC, CPK, CRP/ESR, etc.  Problem # 3:  HYPOTHYROIDISM (ICD-244.9) Assessment: Unchanged  Her updated medication list for this problem includes:    Levothroid 125 Mcg Tabs (Levothyroxine sodium) .Marland Kitchen... 1 po once daily  Orders: Venipuncture (16109) T-Vitamin D (25-Hydroxy) (60454-09811) TLB-BMP (Basic Metabolic Panel-BMET) (80048-METABOL) TLB-CBC Platelet - w/Differential (85025-CBCD) TLB-Hepatic/Liver Function Pnl (80076-HEPATIC) TLB-TSH (Thyroid Stimulating Hormone) (84443-TSH) TLB-CK Total Only(Creatine Kinase/CPK) (82550-CK) TLB-CRP-High Sensitivity (C-Reactive Protein) (86140-FCRP) TLB-Sedimentation Rate (ESR) (85652-ESR)  Problem # 4:  HYPERLIPIDEMIA  (ICD-272.4) Assessment: Deteriorated  will have to stop simva, if a new statin is started I think it should be Livalo The following medications were removed from the medication list:    Simvastatin 80 Mg Tabs (Simvastatin) .Marland Kitchen... 1 po once daily  Orders: Venipuncture (91478) T-Vitamin D (25-Hydroxy) (29562-13086) TLB-BMP (Basic Metabolic Panel-BMET) (80048-METABOL) TLB-CBC Platelet - w/Differential (85025-CBCD) TLB-Hepatic/Liver Function Pnl (80076-HEPATIC) TLB-TSH (Thyroid Stimulating Hormone) (84443-TSH) TLB-CK Total Only(Creatine Kinase/CPK) (82550-CK) TLB-CRP-High Sensitivity (C-Reactive Protein) (86140-FCRP) TLB-Sedimentation Rate (ESR) (85652-ESR)  Labs Reviewed: SGOT: 36 (03/21/2009)   SGPT: 32 (03/21/2009)  10 Yr Risk Heart Disease: N/A   HDL:50.90 (03/21/2009), 46.90 (09/21/2008)  LDL:76 (03/21/2009), 80 (09/21/2008)  Chol:144 (03/21/2009), 149 (09/21/2008)  Trig:86.0 (03/21/2009), 112.0 (09/21/2008)  Complete Medication List: 1)  Procardia Xl 30 Mg Tb24 (Nifedipine) .... Take 1 by mouth once daily 2)  Lopressor 50 Mg Tabs (Metoprolol tartrate) .Marland Kitchen.. 1 by mouth three times a day 3)  Lasix 40 Mg Tabs (Furosemide) .Marland Kitchen.. 1 by mouth  two times a day 4)  Aspir-low 81 Mg Tbec (Aspirin) .... Once daily 5)  Ativan 0.5 Mg Tabs (Lorazepam) .Marland Kitchen.. 1 by mouth at bedtime 6)  Levothroid 125 Mcg Tabs (Levothyroxine sodium) .Marland Kitchen.. 1 po once daily 7)  Doxepin Hcl 50 Mg Caps (Doxepin hcl) .... 3 by mouth at bedtime 8)  Depo-estradiol 5 Mg/ml Oil (Estradiol cypionate) .... Marland Kitchen25 mg inj q 18-25 days 9)  Nitrolingual 0.4 Mg/spray Soln (Nitroglycerin) .... One spray under tongue every 5 minutes as needed for chest pain---may repeat times three 10)  Tranxene-t 15 Mg Tabs (Clorazepate dipotassium) .Marland Kitchen.. 1 by mouth three times a day prn 11)  Vitamin D3 1000 Unit Tabs (Cholecalciferol) .... Once daily 12)  Klor-con M20 20 Meq Cr-tabs (Potassium chloride crys cr) .Marland Kitchen.. 1 by mouth bid  Hypertension  Assessment/Plan:      The patient's hypertensive risk group is category C: Target organ damage and/or diabetes.  Today's blood pressure is 140/80.  Her blood pressure goal is < 140/90.  Patient Instructions: 1)  Please schedule a follow-up appointment in 2 weeks. 2)  It is important that you exercise regularly at least 20 minutes 5 times a week. If you develop  chest pain, have severe difficulty breathing, or feel very tired , stop exercising immediately and seek medical attention.

## 2010-06-20 NOTE — Progress Notes (Signed)
Summary: OV TOMORROW  Phone Note Call from Patient Call back at [facility] Phone [phone] Call back at Work Phone [phone]   Summary of Call: Pt started Zetia last night. She c/o leg weakness, same symptoms as when she was on simvastatin. She scheduled office visit for tomorrow am to discuss with MD. She will not take anymore zetia until after office visit.  Initial call taken by: Lamar Sprinkles, CMA,  January 03, 2010 4:19 PM  Follow-up for Phone Call        Agree - do not take Zetia Follow-up by: Tresa Garter MD,  January 03, 2010 5:01 PM

## 2010-06-20 NOTE — Progress Notes (Signed)
Summary: Vaginal itching  Phone Note Call from Patient Call back at Home Phone [phone] Call back at Work Phone [phone]   Summary of Call: Patient started her sublingual B12. She now has c/o vaginal itching and burning. She does not think the 2 are related but is desperate for some relief of vaginal symptoms. Please advise.  Initial call taken by: Lamar Sprinkles, CMA,  January 17, 2010 3:39 PM  Follow-up for Phone Call        If no d/c try Triamc oint  Follow-up by: Tresa Garter MD,  January 17, 2010 5:49 PM  Additional Follow-up for Phone Call Additional follow up Details #1::        Left detailed vm on pt's hm # per her request. Requested that she call office back if she has vaginal discharge b/c MD may want to change rx.  Additional Follow-up by: Lamar Sprinkles, CMA,  January 17, 2010 5:53 PM    New/Updated Medications: TRIAMCINOLONE ACETONIDE 0.5 % OINT (TRIAMCINOLONE ACETONIDE) use two times a day as needed Prescriptions: TRIAMCINOLONE ACETONIDE 0.5 % OINT (TRIAMCINOLONE ACETONIDE) use two times a day as needed  #120 g x 3   Entered and Authorized by:   Tresa Garter MD   Signed by:   Tresa Garter MD on 01/17/2010   Method used:   Electronically to        [facility]  [phone]* (retail)       [address]       [place], [place]  [address]       Ph: [phone] or [phone]       Fax: [phone]   RxID:   [id number]

## 2010-06-20 NOTE — Assessment & Plan Note (Signed)
Medications Added AZITHROMYCIN 250 MG  TABS (AZITHROMYCIN) 2 by mouth once daily for 1 day, then1 by mouth once daily for 4 days      Allergies Added: ! AMOXICILLIN ! * ANTIHISTAMINES ! * BELLERGAL ! CIPRO ! CODEINE ! CRESTOR (ROSUVASTATIN CALCIUM) ! DARVOCET ! EPINEPHRINE ! IMDUR ! LIDOCAINE ! LIPITOR ! MORPHINE ! TETRACYCLINE  Vital Signs:  Patient Profile:   65 Years Old Female Height:     68 inches Weight:      161 pounds Temp:     97.2 degrees F Pulse rate:   64 / minute BP sitting:   131 / 73  Vitals Entered By: Corwin Levins MD (March 12, 2007 12:19 PM)                 History of Present Illness: here with severe ST for 2 days with fever, malaise  Current Allergies (reviewed today): ! AMOXICILLIN ! * ANTIHISTAMINES ! * BELLERGAL ! CIPRO ! CODEINE ! CRESTOR (ROSUVASTATIN CALCIUM) ! DARVOCET ! EPINEPHRINE ! IMDUR ! LIDOCAINE ! LIPITOR ! MORPHINE ! TETRACYCLINE Updated/Current Medications (including changes made in today's visit):  PROCARDIA XL 30 MG  TB24 (NIFEDIPINE) qd LOPRESSOR 50 MG TABS (METOPROLOL TARTRATE) tid LASIX 40 MG TABS (FUROSEMIDE) bid ASPIR-LOW 81 MG TBEC (ASPIRIN) qd ATIVAN 0.5 MG  TABS (LORAZEPAM) qhs LEVOTHROID 125 MCG TABS (LEVOTHYROXINE SODIUM) qd DOXEPIN HCL 50 MG  CAPS (DOXEPIN HCL) tid POTASSIUM CHLORIDE 20 MEQ  PACK (POTASSIUM CHLORIDE) bid SIMVASTATIN 80 MG TABS (SIMVASTATIN) qd DEPO-ESTRADIOL 5 MG/ML  OIL (ESTRADIOL CYPIONATE)  AZITHROMYCIN 250 MG  TABS (AZITHROMYCIN) 2 by mouth once daily for 1 day, then1 by mouth once daily for 4 days   Past Medical History:    Reviewed history from 02/11/2007 and no changes required:       Anxiety       Coronary artery disease       Depression       Hyperlipidemia       Hypertension       Hypothyroidism  Past Surgical History:    Reviewed history and no changes required:       none   Family History:    Reviewed history from 02/11/2007 and no changes required:   Family History Hypertension       Family History of CAD Female 1st degree relative <50  Social History:    Reviewed history from 02/11/2007 and no changes required:       Single       Former Smoker       Occupation: Dipl[employer] Services operational officer       Alcohol use-no   Risk Factors:  Alcohol use:  no    Physical Exam  General:     Well-developed,well-nourished,in no acute distress; alert,appropriate and cooperative throughout examination Head:     Normocephalic and atraumatic without obvious abnormalities. No apparent alopecia or balding. Ears:     R TM erythema and L TM erythema.   Nose:     External nasal examination shows no deformity or inflammation. Nasal mucosa are pink and moist without lesions or exudates. Mouth:     pharyngeal erythema.   Neck:     cervical lymphadenopathy.   Lungs:     Normal respiratory effort, chest expands symmetrically. Lungs are clear to auscultation, no crackles or wheezes. Heart:     Normal rate and regular rhythm. S1 and S2 normal without gallop, murmur, click, rub or other extra sounds. Extremities:  no edema    Impression & Recommendations:  Problem # 1:  PHARYNGITIS-ACUTE (ICD-462)  Her updated medication list for this problem includes:    Aspir-low 81 Mg Tbec (Aspirin) ..... Qd    Azithromycin 250 Mg Tabs (Azithromycin) .Marland Kitchen... 2 by mouth once daily for 1 day, then1 by mouth once daily for 4 days tx as above, f/u any worsening s/s   Complete Medication List: 1)  Procardia Xl 30 Mg Tb24 (Nifedipine) .... Qd 2)  Lopressor 50 Mg Tabs (Metoprolol tartrate) .... Tid 3)  Lasix 40 Mg Tabs (Furosemide) .... Bid 4)  Aspir-low 81 Mg Tbec (Aspirin) .... Qd 5)  Ativan 0.5 Mg Tabs (Lorazepam) .... Qhs 6)  Levothroid 125 Mcg Tabs (Levothyroxine sodium) .... Qd 7)  Doxepin Hcl 50 Mg Caps (Doxepin hcl) .... Tid 8)  Potassium Chloride 20 Meq Pack (Potassium chloride) .... Bid 9)  Simvastatin 80 Mg Tabs (Simvastatin) .... Qd 10)  Depo-estradiol 5 Mg/ml Oil  (Estradiol cypionate) 11)  Azithromycin 250 Mg Tabs (Azithromycin) .... 2 by mouth once daily for 1 day, then1 by mouth once daily for 4 days     Prescriptions: AZITHROMYCIN 250 MG  TABS (AZITHROMYCIN) 2 by mouth once daily for 1 day, then1 by mouth once daily for 4 days  #6 x 1   Entered and Authorized by:   Corwin Levins MD   Signed by:   Corwin Levins MD on 03/12/2007   Method used:   Electronically sent to ...       [facility]  [address]  [phone]*       [address]       [place], [place]  [address]       Ph: [phone] or [phone]       Fax: [phone]   RxID:   [id number]  ]

## 2010-06-20 NOTE — Assessment & Plan Note (Signed)
Summary: FEVER/ NOT BETTER /NWS   Vital Signs:  Patient Profile:   65 Years Old Female Height:     68 inches Weight:      166 pounds Temp:     97.4 degrees F oral Pulse rate:   75 / minute BP sitting:   136 / 86  (left arm)  Vitals Entered By: Tora Perches (Sep 22, 2007 4:42 PM)                 Chief Complaint:  fever.  History of Present Illness: The patient presents for a wellness examination  The patient presents with complaints of sore throat, fever, cough, sinus congestion and drainge of several days duration. Not better with OTC meds. Chest hurts with coughing. Can't sleep due to cough. The mucus is colored. Saw Dr Everardo All last Fri - given Zpac. T =100F x 2 d     Current Allergies (reviewed today): ! AMOXICILLIN ! * ANTIHISTAMINES ! * BELLERGAL ! CIPRO ! CODEINE ! CRESTOR (ROSUVASTATIN CALCIUM) ! DARVOCET ! EPINEPHRINE ! IMDUR ! LIDOCAINE ! LIPITOR ! MORPHINE ! TETRACYCLINE  Past Medical History:    Reviewed history from 02/11/2007 and no changes required:       Anxiety       Coronary artery disease       Depression       Hyperlipidemia       Hypertension       Hypothyroidism   Family History:    Reviewed history from 03/12/2007 and no changes required:       Family History Hypertension       Family History of CAD Female 1st degree relative <50  Social History:    Reviewed history from 03/12/2007 and no changes required:       Single       Former Smoker       Occupation: Diplomatic Services operational officer       Alcohol use-no    Review of Systems       The patient complains of fever and hemoptysis.  The patient denies weight loss, chest pain, syncope, peripheral edema, abdominal pain, melena, hematochezia, and hematuria.     Physical Exam  General:     Well-developed,well-nourished,in no acute distress; alert,appropriate and cooperative throughout examination Head:     Normocephalic and atraumatic without obvious abnormalities. No apparent alopecia or  balding. Eyes:     No corneal or conjunctival inflammation noted. EOMI. Perrla. Funduscopic exam benign, without hemorrhages, exudates or papilledema. Vision grossly normal. Ears:     External ear exam shows no significant lesions or deformities.  Otoscopic examination reveals clear canals, tympanic membranes are intact bilaterally without bulging, retraction, inflammation or discharge. Hearing is grossly normal bilaterally. Nose:     Swollen mucosa Mouth:     Eryth throat Neck:     No deformities, masses, or tenderness noted. Lungs:     Normal respiratory effort, chest expands symmetrically. Lungs are clear to auscultation, no crackles or wheezes. Heart:     Normal rate and regular rhythm. S1 and S2 normal without gallop, murmur, click, rub or other extra sounds. Abdomen:     Bowel sounds positive,abdomen soft and non-tender without masses, organomegaly or hernias noted. Msk:     No deformity or scoliosis noted of thoracic or lumbar spine.   Pulses:     R and L carotid,radial,femoral,dorsalis pedis and posterior tibial pulses are full and equal bilaterally Extremities:     No clubbing, cyanosis, edema, or deformity  noted with normal full range of motion of all joints.   Neurologic:     No cranial nerve deficits noted. Station and gait are normal. Plantar reflexes are down-going bilaterally. DTRs are symmetrical throughout. Sensory, motor and coordinative functions appear intact. Skin:     Intact without suspicious lesions or rashes Cervical Nodes:     No lymphadenopathy noted Inguinal Nodes:     No significant adenopathy Psych:     Cognition and judgment appear intact. Alert and cooperative with normal attention span and concentration. No apparent delusions, illusions, hallucinations    Impression & Recommendations:  Problem # 1:  WELL ADULT EXAM (ICD-V70.0) Assessment: Improved The labs were reviewed with the patient. Haelth related issues discussed incl GYN appt and  colonoscopy    Problem # 2:  ACUTE BRONCHITIS (ICD-466.0) Assessment: Improved  Her updated medication list for this problem includes:    Zithromax 500 Mg Tabs (Azithromycin) ..... Qd    Tessalon Perles 100 Mg Caps (Benzonatate) .Marland Kitchen... Three times a day as needed cough   Complete Medication List: 1)  Procardia Xl 30 Mg Tb24 (Nifedipine) .Marland Kitchen.. 1 po qd 2)  Lopressor 50 Mg Tabs (Metoprolol tartrate) .Marland Kitchen.. 1 po tid 3)  Lasix 40 Mg Tabs (Furosemide) .Marland Kitchen.. 1 po bid 4)  Aspir-low 81 Mg Tbec (Aspirin) .... Qd 5)  Ativan 0.5 Mg Tabs (Lorazepam) .Marland Kitchen.. 1 po qhs 6)  Levothroid 125 Mcg Tabs (Levothyroxine sodium) .Marland Kitchen.. 1 po qd 7)  Doxepin Hcl 50 Mg Caps (Doxepin hcl) .... 3 by mouth q hs 8)  Potassium Chloride 20 Meq Pack (Potassium chloride) .Marland Kitchen.. 1 po bid 9)  Simvastatin 80 Mg Tabs (Simvastatin) .Marland Kitchen.. 1 po qd 10)  Depo-estradiol 5 Mg/ml Oil (Estradiol cypionate) .... Marland Kitchen75 mg inj q 18 d 11)  Zithromax 500 Mg Tabs (Azithromycin) .... Qd 12)  Tessalon Perles 100 Mg Caps (Benzonatate) .... Three times a day as needed cough 13)  Nitrolingual Duo Pack 0.4 Mg/spray Tl Soln (Nitroglycerin) .... As needed as dirr. 14)  Tranxene-t 15 Mg Tabs (Clorazepate dipotassium) .Marland Kitchen.. 1 by mouth three times a day prn 15)  Aspirin 81 Mg Tbec (Aspirin) .... One by mouth every day 16)  Vitamin D3 1000 Unit Tabs (Cholecalciferol) .Marland Kitchen.. 1 by mouth daily   Patient Instructions: 1)  Please schedule a follow-up appointment in 6 months.   Prescriptions: NITROLINGUAL DUO PACK 0.4 MG/SPRAY TL SOLN (NITROGLYCERIN) as needed as dirr.  #1 x 6   Entered and Authorized by:   Tresa Garter MD   Signed by:   Tresa Garter MD on 09/22/2007   Method used:   Print then Give to Patient   RxID:   [id number] ATIVAN 0.5 MG  TABS (LORAZEPAM) 1 po qhs  #30 x 12   Entered and Authorized by:   Tresa Garter MD   Signed by:   Tresa Garter MD on 09/22/2007   Method used:   Print then Give to Patient   RxID:    [id number] DEPO-ESTRADIOL 5 MG/ML  OIL (ESTRADIOL CYPIONATE) .75 mg inj q 18 d  #qs x 12   Entered and Authorized by:   Tresa Garter MD   Signed by:   Tresa Garter MD on 09/22/2007   Method used:   Print then Give to Patient   RxID:   [id number] SIMVASTATIN 80 MG TABS (SIMVASTATIN) 1 po qd  #30 x 12   Entered and Authorized by:   Tresa Garter MD  Signed by:   Tresa Garter MD on 09/22/2007   Method used:   Print then Give to Patient   RxID:   [id number] DOXEPIN HCL 50 MG  CAPS (DOXEPIN HCL) 3 by mouth q hs  #90 x 12   Entered and Authorized by:   Tresa Garter MD   Signed by:   Tresa Garter MD on 09/22/2007   Method used:   Print then Give to Patient   RxID:   [id number] TRANXENE-T 15 MG  TABS (CLORAZEPATE DIPOTASSIUM) 1 by mouth three times a day prn  #90 x 12   Entered and Authorized by:   Tresa Garter MD   Signed by:   Tresa Garter MD on 09/22/2007   Method used:   Print then Give to Patient   RxID:   [id number] LEVOTHROID 125 MCG TABS (LEVOTHYROXINE SODIUM) 1 po qd Brand medically necessary #30 x 12   Entered and Authorized by:   Tresa Garter MD   Signed by:   Tresa Garter MD on 09/22/2007   Method used:   Print then Give to Patient   RxID:   [id number] POTASSIUM CHLORIDE 20 MEQ  PACK (POTASSIUM CHLORIDE) 1 po bid  #60 x 12   Entered and Authorized by:   Tresa Garter MD   Signed by:   Tresa Garter MD on 09/22/2007   Method used:   Print then Give to Patient   RxID:   [id number] LASIX 40 MG TABS (FUROSEMIDE) 1 po bid  #60 x 12   Entered and Authorized by:   Tresa Garter MD   Signed by:   Tresa Garter MD on 09/22/2007   Method used:   Print then Give to Patient   RxID:   [id number] LOPRESSOR 50 MG TABS (METOPROLOL TARTRATE) 1 po tid  #90 x 12   Entered and Authorized by:   Tresa Garter MD   Signed by:   Tresa Garter MD  on 09/22/2007   Method used:   Print then Give to Patient   RxID:   [id number] PROCARDIA XL 30 MG  TB24 (NIFEDIPINE) 1 po qd  #30 x 12   Entered and Authorized by:   Tresa Garter MD   Signed by:   Tresa Garter MD on 09/22/2007   Method used:   Print then Give to Patient   RxID:   [id number]  ]

## 2010-06-20 NOTE — Assessment & Plan Note (Signed)
Summary: 1 MO ROV /NWS   Vital Signs:  Patient profile:   65 year old female Height:      68 inches Weight:      167 pounds BMI:     25.48 O2 Sat:      94 % on Room air Temp:     97.4 degrees F oral Pulse rate:   74 / minute Pulse rhythm:   regular Resp:     16 per minute BP sitting:   120 / 80  (left arm) Cuff size:   regular  Vitals Entered By: Lanier Prude, CMA(AAMA) (December 27, 2009 2:58 PM)  O2 Flow:  Room air CC: 1 mo f/u Comments pt is not taking Lipitor or Vit B12   Primary Care Provider:  Georgina Quint Plotnikov MD  CC:  1 mo f/u.  History of Present Illness: F/u LE weakness and stumbling due to Zocor - much better F/u elev chol, anxiety  Current Medications (verified): 1)  Procardia Xl 30 Mg  Tb24 (Nifedipine) .... Take 1 By Mouth Once Daily 2)  Lopressor 50 Mg Tabs (Metoprolol Tartrate) .Marland Kitchen.. 1 By Mouth Three Times A Day 3)  Lasix 40 Mg Tabs (Furosemide) .Marland Kitchen.. 1 By Mouth  Two Times A Day 4)  Aspir-Low 81 Mg Tbec (Aspirin) .... Once Daily 5)  Ativan 0.5 Mg  Tabs (Lorazepam) .Marland Kitchen.. 1 By Mouth At Bedtime 6)  Levothroid 125 Mcg Tabs (Levothyroxine Sodium) .Marland Kitchen.. 1 Po Once Daily 7)  Doxepin Hcl 50 Mg  Caps (Doxepin Hcl) .... 3 By Mouth At Bedtime 8)  Depo-Estradiol 5 Mg/ml  Oil (Estradiol Cypionate) .... Marland Kitchen25 Mg Inj Q 18-25 Days 9)  Nitrolingual 0.4 Mg/spray Soln (Nitroglycerin) .... One Spray Under Tongue Every 5 Minutes As Needed For Chest Pain---May Repeat Times Three 10)  Tranxene-T 15 Mg  Tabs (Clorazepate Dipotassium) .Marland Kitchen.. 1 By Mouth Three Times A Day Prn 11)  Vitamin D3 1000 Unit Tabs (Cholecalciferol) .... Once Daily 12)  Klor-Con M20 20 Meq Cr-Tabs (Potassium Chloride Crys Cr) .Marland Kitchen.. 1 By Mouth Bid 13)  Lipitor 10 Mg Tabs (Atorvastatin Calcium) .... Take One Tablet By Mouth Once A Day  Allergies (verified): 1)  ! Amoxicillin 2)  ! * Antihistamines 3)  ! * Bellergal 4)  ! Cipro 5)  ! Codeine 6)  ! Darvocet 7)  ! Epinephrine 8)  ! Imdur 9)  ! Lidocaine 10)   ! Morphine 11)  ! Tetracycline 12)  ! Benadryl 13)  ! 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 ANXIETY (ICD-300.00)    Past Surgical History: Last updated: 03/12/2007 none  Social History: Last updated: 11/03/2009 Single Former Smoker Occupation: Dipl[employer] Services operational officer Alcohol use-no Regular exercise-yes  Review of Systems  The patient denies weight loss, weight gain, chest pain, syncope, and dyspnea on exertion.    Physical Exam  General:  overweight white female who is emotionally stressed but in no medical distress Head:  normocephalic and atraumatic.   Eyes:  pupils equal, pupils round, corneas and lenses clear, and no injection.   Ears:  R ear normal and L ear normal.   Mouth:  Oral mucosa and oropharynx without lesions  or exudates.  Teeth in good repair. Neck:  supple, full ROM, no masses, no thyromegaly, no JVD, normal carotid upstroke, no carotid bruits, no cervical lymphadenopathy, and no neck tenderness.   Lungs:  normal respiratory effort and normal breath sounds.   Heart:  normal rate and regular rhythm.   Abdomen:  Bowel sounds positive,abdomen soft and non-tender without masses, organomegaly or hernias noted. Msk:  back exam: nl stand, gait, toe/heel walk, step-up to exam table, SLR sitting, DTR's at patella and achilles tendon. No CVAT. Nl sensation to light touch and pin-prick Extremities:  No clubbing, cyanosis, edema, or deformity noted with normal full range of motion of all joints.     Neurologic:  alert & oriented X3, cranial nerves II-XII intact, strength normal in all extremities, sensation intact to light touch, sensation intact to pinprick, gait normal, and DTRs symmetrical and normal.   Skin:  turgor normal and color normal.   Psych:  Oriented X3, normally interactive, good eye contact, and moderately anxious.     Impression & Recommendations:  Problem # 1:  GAIT DISTURBANCE (ICD-781.2) due to statins Assessment Improved  Problem # 2:  PARESTHESIA (ICD-782.0) due to statins Assessment: Improved  Problem # 3:  WEAKNESS (ICD-780.79) due to statins Assessment: Improved  Problem # 4:  BACK PAIN, LUMBAR (ICD-724.2) MSK Assessment: Improved  Her updated medication list for this problem includes:    Aspir-low 81 Mg Tbec (Aspirin) ..... Once daily  Problem # 5:  Low B12 Nasal gel sample 1spr q 1 wk untill gone, then po  Problem # 6:  DEPRESSION (ICD-311) Assessment: Unchanged She will try dating websites Her updated medication list for this problem includes:    Ativan 0.5 Mg Tabs (Lorazepam) .Marland Kitchen... 1 by mouth at bedtime    Doxepin Hcl 50 Mg Caps (Doxepin hcl) .Marland KitchenMarland KitchenMarland KitchenMarland Kitchen 3 by mouth at bedtime    Tranxene-t 15 Mg Tabs (Clorazepate dipotassium) .Marland Kitchen... 1 by mouth three times a day prn  Complete Medication List: 1)  Procardia Xl 30 Mg Tb24 (Nifedipine) .... Take 1 by mouth once daily 2)  Lopressor 50 Mg Tabs (Metoprolol tartrate) .Marland Kitchen.. 1 by mouth three times a day 3)  Lasix 40 Mg Tabs (Furosemide) .Marland Kitchen.. 1 by mouth  two times a day 4)  Aspir-low 81 Mg Tbec (Aspirin) .... Once daily 5)  Ativan 0.5 Mg Tabs (Lorazepam) .Marland Kitchen.. 1 by mouth at bedtime 6)  Levothroid 125 Mcg Tabs (Levothyroxine sodium) .Marland Kitchen.. 1 po once daily 7)  Doxepin Hcl 50 Mg Caps (Doxepin hcl) .... 3 by mouth at bedtime 8)  Depo-estradiol 5 Mg/ml Oil (Estradiol cypionate) .... Marland Kitchen25 mg inj q 18-25 days 9)  Nitrolingual 0.4 Mg/spray Soln (Nitroglycerin) .... One spray under tongue every 5 minutes as needed for  chest pain---may repeat times three 10)  Tranxene-t 15 Mg Tabs (Clorazepate dipotassium) .Marland Kitchen.. 1 by mouth three times a day prn 11)  Vitamin D3 1000 Unit Tabs (Cholecalciferol) .... Once daily 12)  Klor-con M20 20 Meq Cr-tabs (Potassium chloride crys cr) .Marland Kitchen.. 1 by mouth bid 13)  Zetia 10 Mg Tabs (Ezetimibe) .Marland Kitchen.. 1 by mouth qd  Patient Instructions: 1)  Please schedule a follow-up appointment in 4-6 weeks. 2)  Lipid Panel prior to visit, ICD-9: 3)  aldolase 995.20 272.20 4)  Hepatic Panel prior to visit, ICD-9: Prescriptions: ZETIA 10 MG TABS (EZETIMIBE) 1 by mouth qd  #30 x 12   Entered and Authorized by:   Tresa Garter MD   Signed by:  Tresa Garter MD on 12/27/2009   Method used:   Print then Give to Patient   RxID:   [id number]

## 2010-06-20 NOTE — Progress Notes (Signed)
Summary: medication questions  Phone Note Call from Patient Call back at Home Phone [phone] Call back at Work Phone [phone]   Caller: Patient Reason for Call: Talk to Nurse, Talk to Doctor Summary of Call: pt had a reaction on simvastatin and was taken off and went back to Dr. Posey Rea yesterday and her over all cholesterol 266 but ldl was 189 pt never went on the 10 mg of lipitor per Dr. Posey Rea request and he wants her to start Zetia. She wants Dr. Excell Seltzer to be aware and look at notes and advise Initial call taken by: Omer Jack,  December 28, 2009 9:49 AM  Follow-up for Phone Call        I spoke with the pt and made her aware that she should start the Zetia as prescribed by Dr Posey Rea.  I told her that Dr Excell Seltzer would like her cholesterol managed by PCP.  The pt does have follow-up labs and OV planned with Dr Posey Rea.   Follow-up by: [name], RN, BSN,  December 28, 2009 12:54 PM

## 2010-06-20 NOTE — Letter (Signed)
Summary: Cardiac Catheterization Instructions- Main Lab  [facility], Main Office  [address]   [place], [place] [address]   Phone: [phone]  Fax: [phone]     08/19/2009 MRN: [id number]  [facility] [address] [place], [place]  [address]  Dear Ms. [name],   You are scheduled for Cardiac Catheterization on Monday August 22, 2009              with Dr. Excell Seltzer.  Please arrive at the [facility] at 12:00 noon on the day of your procedure.  1. DIET     _X___ Nothing to eat after midnight.  You may have clear liquids from midnight until 8:00 a.m. then nothing to drink.  You may take your medications with a sip of water.  2. MAKE SURE YOU TAKE YOUR ASPIRIN.  3. __X___ DO NOT TAKE these medications before your procedure:     Do not take Furosemide the morning of procedure.           _X___ YOU MAY TAKE ALL of your remaining medications with a small amount of water.          4. Plan for one night stay - bring personal belongings (i.e. toothpaste, toothbrush, etc.)  5. Bring a current list of your medications and current insurance cards.  6. Must have a responsible person to drive you home.   7. Someone must be with you for the first 24 hours after you arrive home.  8. Please wear clothes that are easy to get on and off and wear slip-on shoes.  *Special note: Every effort is made to have your procedure done on time.  Occasionally there are emergencies that present themselves at the hospital that may cause delays.  Please be patient if a delay does occur.  If you have any questions after you get home, please call the office at the number listed above.  [name], RN, BSN

## 2010-06-20 NOTE — Progress Notes (Signed)
Summary: rtn to work  Phone Note Call from Patient Call back at [employer] [phone]   Caller: Patient Reason for Call: Talk to Nurse, Talk to Doctor Summary of Call: pt would like  to go back to work on Monday if it is ok with MD and needs a note to return Initial call taken by: Omer Jack,  August 24, 2009 10:42 AM  Follow-up for Phone Call        Busy. [name], RN, BSN  August 24, 2009 2:02 PM  Phone line is busy.  I spoke with Dr Excell Seltzer and the pt may return to work on Monday with no restrictions.  [name], RN, BSN  August 24, 2009 7:06 PM  Pt called again I told her Dr. Excell Seltzer said she could go back to work Monday and she wants to come pick up her letter to rtn to work tomorrow. I told patient as soon as letter was ready Leotis Shames would call and if Lauren did not reach her for what ever reason I would continue to call and she could pick it up before 5pm Omer Jack  August 25, 2009 4:54 PM   Additional Follow-up for Phone Call Additional follow up Details #1::        I spoke with the pt and made her aware that her RTW note is at the front desk for pick-up.  Additional Follow-up by: [name], RN, BSN,  August 26, 2009 9:19 AM

## 2010-06-20 NOTE — Progress Notes (Signed)
Summary: OV TODAY  Phone Note Call from Patient   Summary of Call: Pt c/o recurrent episode of leg weakness and tingling in her back. Last seen by Dr Yetta Barre. She has been off simvastatin but this episode is much worse. Scheduled for office visit today at 4:20 Initial call taken by: Lamar Sprinkles, CMA,  November 14, 2009 3:56 PM

## 2010-06-20 NOTE — Assessment & Plan Note (Signed)
Summary: STRANGE SENSATIONS AFTER TAKING NEW MEDICINE/ ALSO [facility] FOR T...   Vital Signs:  Patient profile:   65 year old female Height:      68 inches Weight:      167 pounds BMI:     25.48 O2 Sat:      97 % on Room air Temp:     97.3 degrees F oral Pulse rate:   70 / minute Pulse rhythm:   regular Resp:     16 per minute BP sitting:   120 / 70  (left arm) Cuff size:   regular  Vitals Entered By: Lanier Prude, CMA(AAMA) (January 04, 2010 9:37 AM)  O2 Flow:  Room air CC: back/legs sore and weakness Is Patient Diabetic? No   Primary Care Provider:  Tresa Garter MD  CC:  back/legs sore and weakness.  History of Present Illness: C/o having a similar weak sensation in her lags x hrs after she took Zetia once. Better now. F/u hyperlipidemia - she wants to do something about it. C/o LBP  Current Medications (verified): 1)  Procardia Xl 30 Mg  Tb24 (Nifedipine) .... Take 1 By Mouth Once Daily 2)  Lopressor 50 Mg Tabs (Metoprolol Tartrate) .Marland Kitchen.. 1 By Mouth Three Times A Day 3)  Lasix 40 Mg Tabs (Furosemide) .Marland Kitchen.. 1 By Mouth  Two Times A Day 4)  Aspir-Low 81 Mg Tbec (Aspirin) .... Once Daily 5)  Ativan 0.5 Mg  Tabs (Lorazepam) .Marland Kitchen.. 1 By Mouth At Bedtime 6)  Levothroid 125 Mcg Tabs (Levothyroxine Sodium) .Marland Kitchen.. 1 Po Once Daily 7)  Doxepin Hcl 50 Mg  Caps (Doxepin Hcl) .... 3 By Mouth At Bedtime 8)  Depo-Estradiol 5 Mg/ml  Oil (Estradiol Cypionate) .... Marland Kitchen25 Mg Inj Q 18-25 Days 9)  Nitrolingual 0.4 Mg/spray Soln (Nitroglycerin) .... One Spray Under Tongue Every 5 Minutes As Needed For Chest Pain---May Repeat Times Three 10)  Tranxene-T 15 Mg  Tabs (Clorazepate Dipotassium) .Marland Kitchen.. 1 By Mouth Three Times A Day Prn 11)  Vitamin D3 1000 Unit Tabs (Cholecalciferol) .... Once Daily 12)  Klor-Con M20 20 Meq Cr-Tabs (Potassium Chloride Crys Cr) .Marland Kitchen.. 1 By Mouth Bid 13)  Zetia 10 Mg Tabs (Ezetimibe) .Marland Kitchen.. 1 By Mouth Qd  Allergies (verified): 1)  ! Amoxicillin 2)  ! * Antihistamines 3)  ! *  Bellergal 4)  ! Cipro 5)  ! Codeine 6)  ! Darvocet 7)  ! Epinephrine 8)  ! Imdur 9)  ! Lidocaine 10)  ! Morphine 11)  ! Tetracycline 12)  ! Benadryl 13)  ! Lidocaine 14)  Crestor (Rosuvastatin Calcium) 15)  Lipitor 16)  Xanax (Alprazolam) 17)  Simvastatin 18)  * Zetia  Past History:  Past Medical History: Last updated: 08/19/2009 ACUTE BRONCHITIS (ICD-466.0) FAMILY HISTORY OF CAD FEMALE 1ST DEGREE RELATIVE <50 (ICD-V17.3) CAROTID BRUIT (ICD-785.9) DE QUERVAIN'S TENOSYNOVITIS (ICD-727.04) HYPOTHYROIDISM (ICD-244.9) HYPERTENSION (ICD-401.9) HYPERLIPIDEMIA (ICD-272.4) DEPRESSION (ICD-311) CORONARY ARTERY DISEASE (ICD-414.00) s/p multiple angioplasty procedures in the 90's/early 2000's. 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  The patient denies chest pain and syncope.         LBP  Physical Exam  General:  overweight white female who is emotionally stressed but in no medical distress Mouth:  Oral mucosa and oropharynx without lesions or exudates.  Teeth in good repair. Lungs:  normal respiratory effort and  normal breath sounds.   Heart:  normal rate and regular rhythm.   Abdomen:  Bowel sounds positive,abdomen soft and non-tender without masses, organomegaly or hernias noted. Msk:  back exam: nl stand, gait, toe/heel walk, step-up to exam table, SLR sitting, DTR's at patella and achilles tendon. No CVAT. Nl sensation to light touch and pin-prick Neurologic:  alert & oriented X3, cranial nerves II-XII intact, strength normal in all extremities, sensation intact to light touch, sensation intact to pinprick, gait normal, and DTRs symmetrical and normal.   Skin:  turgor normal and color normal.   Psych:  Oriented X3, normally interactive, good eye contact, and moderately anxious.     Impression &  Recommendations:  Problem # 1:  GAIT DISTURBANCE (ICD-781.2) Assessment New We will stop Zetia  Problem # 2:  PARESTHESIA (ICD-782.0) Assessment: New as above  Problem # 3:  HYPERLIPIDEMIA (ICD-272.4) Assessment: Deteriorated  The following medications were removed from the medication list:    Zetia 10 Mg Tabs (Ezetimibe) .Marland Kitchen... 1 by mouth qd Her updated medication list for this problem includes:    Lovaza 1 Gm Caps (Omega-3-acid ethyl esters) .Marland Kitchen... 2 by mouth two times a day for lipids  Problem # 4:  Low back pain Assessment: Unchanged See "Patient Instructions".   Complete Medication List: 1)  Procardia Xl 30 Mg Tb24 (Nifedipine) .... Take 1 by mouth once daily 2)  Lopressor 50 Mg Tabs (Metoprolol tartrate) .Marland Kitchen.. 1 by mouth three times a day 3)  Lasix 40 Mg Tabs (Furosemide) .Marland Kitchen.. 1 by mouth  two times a day 4)  Aspir-low 81 Mg Tbec (Aspirin) .... Once daily 5)  Ativan 0.5 Mg Tabs (Lorazepam) .Marland Kitchen.. 1 by mouth at bedtime 6)  Levothroid 125 Mcg Tabs (Levothyroxine sodium) .Marland Kitchen.. 1 po once daily 7)  Doxepin Hcl 50 Mg Caps (Doxepin hcl) .... 3 by mouth at bedtime 8)  Depo-estradiol 5 Mg/ml Oil (Estradiol cypionate) .... Marland Kitchen25 mg inj q 18-25 days 9)  Nitrolingual 0.4 Mg/spray Soln (Nitroglycerin) .... One spray under tongue every 5 minutes as needed for chest pain---may repeat times three 10)  Tranxene-t 15 Mg Tabs (Clorazepate dipotassium) .Marland Kitchen.. 1 by mouth three times a day prn 11)  Vitamin D3 1000 Unit Tabs (Cholecalciferol) .... Once daily 12)  Klor-con M20 20 Meq Cr-tabs (Potassium chloride crys cr) .Marland Kitchen.. 1 by mouth bid 13)  Lovaza 1 Gm Caps (Omega-3-acid ethyl esters) .... 2 by mouth two times a day for lipids  Patient Instructions: 1)  Go on Youtube (www.youtube.com) and look up "piriformis stretch", "Ileopsoas stretch", "IT band stretch" and "gluteus stretch". See the anatomy and learn the symptoms.  You can try to self-diagnose.Do the stretches - it may help!  2)  Please schedule a  follow-up appointment in 3 months. 3)  BMP prior to visit, ICD-9: 4)  Hepatic Panel prior to visit, ICD-9:272.2 5)  Lipid Panel prior to visit, ICD-9: 6)  CK 995.20 7)  Vit B12 266.20 Prescriptions: LOVAZA 1 GM CAPS (OMEGA-3-ACID ETHYL ESTERS) 2 by mouth two times a day for lipids  #120 x 12   Entered and Authorized by:   Tresa Garter MD   Signed by:   Tresa Garter MD on 01/04/2010   Method used:   Print then Give to Patient   RxID:   [id number]

## 2010-06-20 NOTE — Progress Notes (Signed)
Summary: CP and Angina   Phone Note Call from Patient Call back at Home Phone [phone]   Caller: Patient Reason for Call: Talk to Nurse, Talk to Doctor Summary of Call: pt has been having some very bad episodes of angina and chest pain and it releaves with nitro. It has happen 4times.  she wants to be seen asap Initial call taken by: Omer Jack,  August 18, 2009 4:36 PM  Follow-up for Phone Call        I spoke with the pt and she has had 4 angina attacks since 07/25/09.  The pt develops a tightness in her chest and uses NTG spray and rest for relief.  The pt also has SOB with exertion. The pt's last episode of angina was last night. The pt used 4 sprays before she had relief last night.  The pt started having problems when she started an exercise regimen in February.  The pt has had tingling in her left arm with exercise.  The pt also has tingling when she  turns her neck to the left side.  The pt has a history of spurs in her neck. The pt has also had neck discomfort which she attributed to muscular cause. The pt did stop exercising after her episode on 07/25/09. I arranged for the pt to see Dr Excell Seltzer on 08/19/09 for evaluation of her symptoms. Follow-up by: [name], RN, BSN,  August 18, 2009 4:47 PM

## 2010-06-20 NOTE — Assessment & Plan Note (Signed)
Summary: CPX/[employer]/#/CD   Vital Signs:  Patient profile:   65 year old female Weight:      162 pounds Temp:     97.2 degrees F oral Pulse rate:   69 / minute BP sitting:   126 / 80  (left arm)  Vitals Entered By: Tora Perches (March 28, 2009 3:10 PM) CC: cpx Is Patient Diabetic? No   Primary Care Provider:  Tresa Garter MD  CC:  cpx.  History of Present Illness: The patient presents for a wellness examination   Current Medications (verified): 1)  Procardia Xl 30 Mg  Tb24 (Nifedipine) .... Take 1 By Mouth Once Daily 2)  Lopressor 50 Mg Tabs (Metoprolol Tartrate) .Marland Kitchen.. 1 By Mouth Three Times A Day 3)  Lasix 40 Mg Tabs (Furosemide) .Marland Kitchen.. 1 By Mouth  Two Times A Day 4)  Aspir-Low 81 Mg Tbec (Aspirin) .... Once Daily 5)  Ativan 0.5 Mg  Tabs (Lorazepam) .Marland Kitchen.. 1 By Mouth At Bedtime 6)  Levothroid 125 Mcg Tabs (Levothyroxine Sodium) .Marland Kitchen.. 1 Po Once Daily 7)  Doxepin Hcl 50 Mg  Caps (Doxepin Hcl) .... 3 By Mouth At Bedtime 8)  Potassium Chloride 20 Meq  Pack (Potassium Chloride) .Marland Kitchen.. 1 Po Two Times A Day 9)  Simvastatin 80 Mg Tabs (Simvastatin) .Marland Kitchen.. 1 Po Once Daily 10)  Depo-Estradiol 5 Mg/ml  Oil (Estradiol Cypionate) .... Marland Kitchen75 Mg Inj Q 18 D 11)  Nitrolingual Duo Pack 0.4 Mg/spray Tl Soln (Nitroglycerin) .... As Needed As Dirr. 12)  Tranxene-T 15 Mg  Tabs (Clorazepate Dipotassium) .Marland Kitchen.. 1 By Mouth Three Times A Day Prn 13)  Vitamin D3 1000 Unit Tabs (Cholecalciferol) .... Once Daily  Allergies: 1)  ! Amoxicillin 2)  ! * Antihistamines 3)  ! * Bellergal 4)  ! Cipro 5)  ! Codeine 6)  ! Crestor (Rosuvastatin Calcium) 7)  ! Darvocet 8)  ! Epinephrine 9)  ! Imdur 10)  ! Lidocaine 11)  ! Lipitor 12)  ! Morphine 13)  ! Tetracycline 14)  ! Benadryl  Past History:  Past Medical History: Last updated: 09/06/2008 ACUTE BRONCHITIS (ICD-466.0) FAMILY HISTORY OF CAD FEMALE 1ST DEGREE RELATIVE <50 (ICD-V17.3) CAROTID BRUIT (ICD-785.9) DE QUERVAIN'S TENOSYNOVITIS  (ICD-727.04) HYPOTHYROIDISM (ICD-244.9) HYPERTENSION (ICD-401.9) HYPERLIPIDEMIA (ICD-272.4) DEPRESSION (ICD-311) CORONARY ARTERY DISEASE (ICD-414.00) ANXIETY (ICD-300.00)    Past Surgical History: Last updated: 03/12/2007 none  Family History: Last updated: 03/12/2007 Family History Hypertension Family History of CAD Female 1st degree relative <50  Social History: Last updated: 03/12/2007 Single Former Smoker 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    Physical Exam  General:  Pt is alert and oriented, in no acute distress. HEENT: normal Neck: normal carotid upstrokes with a left carotid bruit, JVP normal Lungs: CTA CV: RRR without murmur or gallop Abd: soft, NT, positive BS, no bruit, no organomegaly Ext: no clubbing, cyanosis, or edema. peripheral pulses 2+ and equal Skin: warm and dry without rash  Neck:  No deformities, masses, or tenderness noted. Cervical Nodes:  No lymphadenopathy noted Inguinal Nodes:  No significant adenopathy Psych:  Cognition and judgment appear intact. Alert and cooperative with normal attention span and concentration.  No apparent delusions, illusions, hallucinations   Impression & Recommendations:  Problem # 1:  WELL ADULT EXAM (ICD-V70.0) Assessment Comment Only The patient presents for a wellness examination  Health and age related issues were discussed. Available screening tests and vaccinations were discussed as well.  Healthy life style including good diet and execise was discussed.   Problem # 2:  HYPOTHYROIDISM (ICD-244.9) Assessment: Unchanged  Her updated medication list for this problem includes:    Levothroid 125 Mcg Tabs (Levothyroxine sodium) .Marland Kitchen... 1 po once daily  Problem # 3:  HYPERTENSION (ICD-401.9) Assessment: Unchanged  Her updated medication list for this problem includes:    Procardia Xl 30 Mg Tb24 (Nifedipine) .Marland Kitchen... Take 1 by mouth once daily    Lopressor 50 Mg Tabs (Metoprolol tartrate) .Marland Kitchen... 1 by mouth three times a day    Lasix 40 Mg Tabs (Furosemide) .Marland Kitchen... 1 by mouth  two times a day  Problem # 4:  CORONARY ARTERY DISEASE (ICD-414.00) Assessment: Unchanged  Her updated medication list for this problem includes:    Procardia Xl 30 Mg Tb24 (Nifedipine) .Marland Kitchen... Take 1 by mouth once daily    Lopressor 50 Mg Tabs (Metoprolol tartrate) .Marland Kitchen... 1 by mouth three times a day    Lasix 40 Mg Tabs (Furosemide) .Marland Kitchen... 1 by mouth  two times a day    Aspir-low 81 Mg Tbec (Aspirin) ..... Once daily    Nitrolingual Duo Pack 0.4 Mg/spray Tl Soln (Nitroglycerin) .Marland Kitchen... As needed as dirr.  Problem # 5:  DEPRESSION (ICD-311) Assessment: Improved  Her updated medication list for this problem includes:    Ativan 0.5 Mg Tabs (Lorazepam) .Marland Kitchen... 1 by mouth at bedtime    Doxepin Hcl 50 Mg Caps (Doxepin hcl) .Marland KitchenMarland KitchenMarland KitchenMarland Kitchen 3 by mouth at bedtime    Tranxene-t 15 Mg Tabs (Clorazepate dipotassium) .Marland Kitchen... 1 by mouth three times a day prn  Complete Medication List: 1)  Procardia Xl 30 Mg Tb24 (Nifedipine) .... Take 1 by mouth once daily 2)  Lopressor 50 Mg Tabs (Metoprolol tartrate) .Marland Kitchen.. 1 by mouth three times a day 3)  Lasix 40 Mg Tabs (Furosemide) .Marland Kitchen.. 1 by mouth  two times a day 4)  Aspir-low 81 Mg Tbec (Aspirin) .... Once daily 5)  Ativan 0.5 Mg Tabs (Lorazepam) .Marland Kitchen.. 1 by mouth at bedtime 6)  Levothroid 125 Mcg Tabs (Levothyroxine sodium) .Marland Kitchen.. 1 po once daily 7)  Doxepin Hcl 50 Mg Caps (Doxepin hcl) .... 3 by  mouth at bedtime 8)  Potassium Chloride 20 Meq Pack (Potassium chloride) .Marland Kitchen.. 1 po two times a day 9)  Simvastatin 80 Mg Tabs (Simvastatin) .Marland Kitchen.. 1 po once daily 10)  Depo-estradiol 5 Mg/ml Oil (Estradiol cypionate) .... Marland Kitchen75 mg inj q 18 d 11)  Nitrolingual Duo Pack 0.4 Mg/spray Tl Soln (Nitroglycerin) .... As needed as dirr. 12)  Tranxene-t 15 Mg Tabs (Clorazepate dipotassium) .Marland Kitchen.. 1 by mouth three times a day prn 13)  Vitamin D3 1000 Unit Tabs (Cholecalciferol) .... Once daily  Patient Instructions: 1)  90% dark choclate 2)  Greek yogurt with honey 3)  Try to eat more raw plant food, fresh and dry fruit, raw almonds, leafy vegetables, whole foods and less red meat, less animal fat. Poultry and fish is better for you than pork and beef. Avoid processed foods (canned soups, hot dogs, sausage, bacon , frozen dinners). Avoid corn syrup, high fructose syrup or aspartam and Splenda  containing drinks. Honey, Agave and Stevia are better sweeteners. Make your own  dressing with olive  oil, wine vinegar, lemon juce, garlic etc. for your salads. 4)  Start taking a yoga class 5)  Please schedule a follow-up appointment in 6 months. 6)  BMP prior to visit, ICD-9:401.1 Prescriptions: TRANXENE-T 15 MG  TABS (CLORAZEPATE DIPOTASSIUM) 1 by mouth three times a day prn  #270 x 1   Entered and Authorized by:   Tresa Garter MD   Signed by:   Tresa Garter MD on 03/28/2009   Method used:   Print then Give to Patient   RxID:   [id number] ATIVAN 0.5 MG  TABS (LORAZEPAM) 1 by mouth at bedtime  #90 x 1   Entered and Authorized by:   Tresa Garter MD   Signed by:   Tresa Garter MD on 03/28/2009   Method used:   Print then Give to Patient   RxID:   [id number]

## 2010-06-20 NOTE — Progress Notes (Signed)
Summary: B12 - Lovaza ?'s  Phone Note Call from Patient Call back at Home Phone [phone] Call back at Work Phone [phone]   Summary of Call: Nascobal is too expensive. She would like alternative to OTC supplements. Pt does not qualify for discount card b/c she has medicare.  Initial call taken by: Lamar Sprinkles, CMA,  January 13, 2010 2:49 PM  Follow-up for Phone Call        Get a sublingual liquid 1000 micrograms SL once daily (OTC) Follow-up by: Tresa Garter MD,  January 13, 2010 5:10 PM  Additional Follow-up for Phone Call Additional follow up Details #1::        Patient has used liquid.  She c/o abd pain after one dose. She has purchased sublingual tablets and will try those. She will call back w/update after trying.   Patient has concerns over rx for Lovaza. It has warnings that state med may lower the effects of lopressor and thyroid medication. She is on both of these medications. Please advise.   Additional Follow-up by: Lamar Sprinkles, CMA,  January 16, 2010 11:56 AM    Additional Follow-up for Phone Call Additional follow up Details #2::    OK to cont: if possible - do not take Lovaza together w/other meds Follow-up by: Tresa Garter MD,  January 16, 2010 1:30 PM  Additional Follow-up for Phone Call Additional follow up Details #3:: Details for Additional Follow-up Action Taken: Left message on machine to call back to office. Lucious Groves CMA  January 16, 2010 2:42 PM   Patient notified. Lucious Groves CMA  January 17, 2010 8:38 AM

## 2010-06-20 NOTE — Assessment & Plan Note (Signed)
Summary: tingling in back/leg weakness/SD   Vital Signs:  Patient profile:   65 year old female Height:      68 inches Weight:      166 pounds BMI:     25.33 O2 Sat:      98 % on Room air Temp:     97.6 degrees F oral Pulse rate:   70 / minute BP sitting:   158 / 82  (left arm) Cuff size:   regular  Vitals Entered By: Bill Salinas CMA (November 14, 2009 4:39 PM)  O2 Flow:  Room air CC: pt c/o legs going weak with tingling in her back on occasions when she exp these symptoms, pt was taken off simvastatin recently/ ab   Primary Care Provider:  Tresa Garter MD  CC:  pt c/o legs going weak with tingling in her back on occasions when she exp these symptoms and pt was taken off simvastatin recently/ ab.  History of Present Illness: Patient presents for weakness in the distal LE where her legs will get wobbly. She has to catch herself but has not fallen. She has had a problem with leg weakness when previously on crestor. She has been tried on a variety of anti-lipemics and has been on simvastatin 80mg  since '08 without problems.  Several months, April, ago she had a sudden on-set of very low back pain and floppy weakness in the distal LE  and minor tingling in the left arm. Duration was less than 10 min.   Patient was seen by Dr. Yetta Barre June 16th. He stopped simvastatin as possible cause of her leg weakness. A battery of labwork reveals nl CK, nl CMet, nl CRP-H, minimally elevated ESR, nl Vit D. level. L-S spine films revealed mild osteolytic changes low spine but significant DDD L4-5, L5-S1.   She had another episode today of sudden on-set of woblly sensation distal LE but no collapse or fall.   Current Medications (verified): 1)  Procardia Xl 30 Mg  Tb24 (Nifedipine) .... Take 1 By Mouth Once Daily 2)  Lopressor 50 Mg Tabs (Metoprolol Tartrate) .Marland Kitchen.. 1 By Mouth Three Times A Day 3)  Lasix 40 Mg Tabs (Furosemide) .Marland Kitchen.. 1 By Mouth  Two Times A Day 4)  Aspir-Low 81 Mg Tbec (Aspirin) ....  Once Daily 5)  Ativan 0.5 Mg  Tabs (Lorazepam) .Marland Kitchen.. 1 By Mouth At Bedtime 6)  Levothroid 125 Mcg Tabs (Levothyroxine Sodium) .Marland Kitchen.. 1 Po Once Daily 7)  Doxepin Hcl 50 Mg  Caps (Doxepin Hcl) .... 3 By Mouth At Bedtime 8)  Depo-Estradiol 5 Mg/ml  Oil (Estradiol Cypionate) .... Marland Kitchen25 Mg Inj Q 18-25 Days 9)  Nitrolingual 0.4 Mg/spray Soln (Nitroglycerin) .... One Spray Under Tongue Every 5 Minutes As Needed For Chest Pain---May Repeat Times Three 10)  Tranxene-T 15 Mg  Tabs (Clorazepate Dipotassium) .Marland Kitchen.. 1 By Mouth Three Times A Day Prn 11)  Vitamin D3 1000 Unit Tabs (Cholecalciferol) .... Once Daily 12)  Klor-Con M20 20 Meq Cr-Tabs (Potassium Chloride Crys Cr) .Marland Kitchen.. 1 By Mouth Bid  Allergies: 1)  ! Amoxicillin 2)  ! * Antihistamines 3)  ! * Bellergal 4)  ! Cipro 5)  ! Codeine 6)  ! Crestor (Rosuvastatin Calcium) 7)  ! Darvocet 8)  ! Epinephrine 9)  ! Imdur 10)  ! Lidocaine 11)  ! Lipitor 12)  ! Morphine 13)  ! Tetracycline 14)  ! Benadryl 15)  ! Lidocaine 16)  Xanax (Alprazolam)  Past History:  Past Medical History: Last  updated: 08/19/2009 ACUTE BRONCHITIS (ICD-466.0) FAMILY HISTORY OF CAD FEMALE 1ST DEGREE RELATIVE <50 (ICD-V17.3) CAROTID BRUIT (ICD-785.9) DE QUERVAIN'S TENOSYNOVITIS (ICD-727.04) HYPOTHYROIDISM (ICD-244.9) HYPERTENSION (ICD-401.9) HYPERLIPIDEMIA (ICD-272.4) DEPRESSION (ICD-311) CORONARY ARTERY DISEASE (ICD-414.00) s/p multiple angioplasty procedures in the 90's/early 2000's. 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  The patient denies anorexia, fever, weight loss, weight gain, chest pain, syncope, dyspnea  on exertion, abdominal pain, severe indigestion/heartburn, difficulty walking, unusual weight change, and enlarged lymph nodes.    Physical Exam  General:  overweight white female who is emotionally stressed but in no medical distress Head:  normocephalic and atraumatic.   Eyes:  pupils equal, pupils round, corneas and lenses clear, and no injection.   Lungs:  normal respiratory effort and normal breath sounds.   Heart:  normal rate and regular rhythm.   Msk:  back exam: nl stand, gait, toe/heel walk, step-up to exam table, SLR sitting, DTR's at patella and achilles tendon. No CVAT. Nl sensation to light touch and pin-prick Pulses:  2+ radial Neurologic:  alert & oriented X3, cranial nerves II-XII intact, strength normal in all extremities, sensation intact to light touch, sensation intact to pinprick, gait normal, and DTRs symmetrical and normal.   Skin:  turgor normal and color normal.   Psych:  Oriented X3, normally interactive, good eye contact, and moderately anxious.     Impression & Recommendations:  Problem # 1:  WEAKNESS (ICD-780.79) Reviewed all labs and x-rays. No evidence of metabolic derangement or myositis or rhabdo. Neurologic exam is non-focal. There is significant DDD L4-5, L5-S1. Concern for spinal root compression that is intermittent vs demyelinating disease.  Plan - OK to Resume Simvastatin 80 - she has hit goal on this dose, has been on this dose since '08 and had normal labs            Refer to neurology for furthr evaluation - may need MRI (she is claustrophobic and afraid of study).  Complete Medication List: 1)  Procardia Xl 30 Mg Tb24 (Nifedipine) .... Take 1 by mouth once daily 2)  Lopressor 50 Mg Tabs (Metoprolol tartrate) .Marland Kitchen.. 1 by mouth three times a day 3)  Lasix 40 Mg Tabs (Furosemide) .Marland Kitchen.. 1 by mouth  two times a day 4)  Aspir-low 81 Mg Tbec (Aspirin) .... Once daily 5)  Ativan 0.5 Mg Tabs (Lorazepam) .Marland Kitchen.. 1 by mouth at bedtime 6)  Levothroid 125 Mcg  Tabs (Levothyroxine sodium) .Marland Kitchen.. 1 po once daily 7)  Doxepin Hcl 50 Mg Caps (Doxepin hcl) .... 3 by mouth at bedtime 8)  Depo-estradiol 5 Mg/ml Oil (Estradiol cypionate) .... Marland Kitchen25 mg inj q 18-25 days 9)  Nitrolingual 0.4 Mg/spray Soln (Nitroglycerin) .... One spray under tongue every 5 minutes as needed for chest pain---may repeat times three 10)  Tranxene-t 15 Mg Tabs (Clorazepate dipotassium) .Marland Kitchen.. 1 by mouth three times a day prn 11)  Vitamin D3 1000 Unit Tabs (Cholecalciferol) .... Once daily 12)  Klor-con M20 20 Meq Cr-tabs (Potassium chloride crys cr) .Marland Kitchen.. 1 by mouth bid

## 2010-06-20 NOTE — Assessment & Plan Note (Signed)
Summary: angina  Medications Added DEPO-ESTRADIOL 5 MG/ML  OIL (ESTRADIOL CYPIONATE) .25 mg inj q 18 d NITROLINGUAL 0.4 MG/SPRAY SOLN (NITROGLYCERIN) One spray under tongue every 5 minutes as needed for chest pain---may repeat times three        Visit Type:  Follow-up Primary Provider:  Tresa Garter MD  CC:  chest pain.  History of Present Illness: This is a 65 year-old woman with CAD who underwent repeated PCI procedures over 10 years ago for LAD disease. Her last catheterizaiton was in 2002 and she had patency of the LAD and minor disease involving the LCx and RCA. She has done well with medical therapy since that time.  However, over the past 3 weeks whe has developed recurrent episodes of chest pressure across the substernal chest region. She has used NTG spray on several occasions with some relief. Her most recent episode March 30th was the worst to date and she used 7 NTG sprays before she received complete relief. Also complains of dyspnea associated with this.  She denies exertional chest pain. All episodes have occurred in the evenings or on her drive home from work. She has avoided all exercise because of fear about the chest pain.  She denies lightheadedness, syncope, orthopnea, palps, or PND.  Current Medications (verified): 1)  Procardia Xl 30 Mg  Tb24 (Nifedipine) .... Take 1 By Mouth Once Daily 2)  Lopressor 50 Mg Tabs (Metoprolol Tartrate) .Marland Kitchen.. 1 By Mouth Three Times A Day 3)  Lasix 40 Mg Tabs (Furosemide) .Marland Kitchen.. 1 By Mouth  Two Times A Day 4)  Aspir-Low 81 Mg Tbec (Aspirin) .... Once Daily 5)  Ativan 0.5 Mg  Tabs (Lorazepam) .Marland Kitchen.. 1 By Mouth At Bedtime 6)  Levothroid 125 Mcg Tabs (Levothyroxine Sodium) .Marland Kitchen.. 1 Po Once Daily 7)  Doxepin Hcl 50 Mg  Caps (Doxepin Hcl) .... 3 By Mouth At Bedtime 8)  Potassium Chloride 20 Meq  Pack (Potassium Chloride) .Marland Kitchen.. 1 Po Two Times A Day 9)  Simvastatin 80 Mg Tabs (Simvastatin) .Marland Kitchen.. 1 Po Once Daily 10)  Depo-Estradiol 5 Mg/ml  Oil  (Estradiol Cypionate) .... Marland Kitchen25 Mg Inj Q 18 D 11)  Nitrolingual Duo Pack 0.4 Mg/spray Tl Soln (Nitroglycerin) .... As Needed As Dirr. 12)  Tranxene-T 15 Mg  Tabs (Clorazepate Dipotassium) .Marland Kitchen.. 1 By Mouth Three Times A Day Prn 13)  Vitamin D3 1000 Unit Tabs (Cholecalciferol) .... Once Daily  Allergies: 1)  ! Amoxicillin 2)  ! * Antihistamines 3)  ! * Bellergal 4)  ! Cipro 5)  ! Codeine 6)  ! Crestor (Rosuvastatin Calcium) 7)  ! Darvocet 8)  ! Epinephrine 9)  ! Imdur 10)  ! Lidocaine 11)  ! Lipitor 12)  ! Morphine 13)  ! Tetracycline 14)  ! Benadryl 15)  ! Lidocaine  Past History:  Past medical history reviewed for relevance to current acute and chronic problems.  Past Medical History: ACUTE BRONCHITIS (ICD-466.0) FAMILY HISTORY OF CAD FEMALE 1ST DEGREE RELATIVE <50 (ICD-V17.3) CAROTID BRUIT (ICD-785.9) DE QUERVAIN'S TENOSYNOVITIS (ICD-727.04) HYPOTHYROIDISM (ICD-244.9) HYPERTENSION (ICD-401.9) HYPERLIPIDEMIA (ICD-272.4) DEPRESSION (ICD-311) CORONARY ARTERY DISEASE (ICD-414.00) s/p multiple angioplasty procedures in the 90's/early 2000's. ANXIETY (ICD-300.00)    Review of Systems       Negative except as per HPI   Vital Signs:  Patient profile:   65 year old female Height:      68 inches Weight:      163.50 pounds BMI:     24.95 Pulse rate:   66 / minute  Pulse rhythm:   regular Resp:     18 per minute BP sitting:   144 / 74  (left arm) Cuff size:   large  Vitals Entered By: Vikki Ports (August 19, 2009 3:16 PM)  Physical Exam  General:  Pt is alert and oriented, very pleasant woman, in no acute distress. HEENT: normal Neck: normal carotid upstrokes without bruits, JVP normal Lungs: CTA CV: RRR without murmur or gallop Abd: soft, NT, positive BS, no bruit, no organomegaly Ext: no clubbing, cyanosis, or edema. peripheral pulses 2+ and equal Skin: warm and dry without rash    EKG  Procedure date:  08/19/2009  Findings:      NSR, within normal  limits, no significant ST-T changes.  Impression & Recommendations:  Problem # 1:  CORONARY ARTERY DISEASE (ICD-414.00) Pt has new onset chest pain with typical and atypical features. She has known CAD and multiple prior PCI procedures. Since pain has been recurrent and is relieved wiht NTG, I think her pretest probability of obstructive CAD is high. I have recommended cardiac cath for definitive evaluation. Risks and indications were reviewed with the patient who agrees to proceed. Allen's test is positive, and will plan on a transradial approach. She should continue ASA, beta-blocker, other cardiac meds without change. I have reviewed the importance of avoiding strenuous activities until after her cardiac catheterization. She will call EMS if symptoms progress or if chest pain is unrelieved with NTG.  Her updated medication list for this problem includes:    Procardia Xl 30 Mg Tb24 (Nifedipine) .Marland Kitchen... Take 1 by mouth once daily    Lopressor 50 Mg Tabs (Metoprolol tartrate) .Marland Kitchen... 1 by mouth three times a day    Aspir-low 81 Mg Tbec (Aspirin) ..... Once daily    Nitrolingual 0.4 Mg/spray Soln (Nitroglycerin) ..... One spray under tongue every 5 minutes as needed for chest pain---may repeat times three  Orders: Cardiac Catheterization (Cardiac Cath) EKG w/ Interpretation (93000)  Problem # 2:  HYPERTENSION (ICD-401.9) Continue current Rx. Suspect mildly elevated BP related to anxiety level and concern over chest pain. Her updated medication list for this problem includes:    Procardia Xl 30 Mg Tb24 (Nifedipine) .Marland Kitchen... Take 1 by mouth once daily    Lopressor 50 Mg Tabs (Metoprolol tartrate) .Marland Kitchen... 1 by mouth three times a day    Lasix 40 Mg Tabs (Furosemide) .Marland Kitchen... 1 by mouth  two times a day    Aspir-low 81 Mg Tbec (Aspirin) ..... Once daily  Orders: Cardiac Catheterization (Cardiac Cath) EKG w/ Interpretation (93000)  BP today: 144/74 Prior BP: 126/80 (03/28/2009)  Labs Reviewed: K+: 3.9  (03/21/2009) Creat: : 1.0 (03/21/2009)   Chol: 144 (03/21/2009)   HDL: 50.90 (03/21/2009)   LDL: 76 (03/21/2009)   TG: 86.0 (03/21/2009)  Problem # 3:  HYPERLIPIDEMIA (ICD-272.4) Lipids at goal (see below). Continue statin Rx.  Her updated medication list for this problem includes:    Simvastatin 80 Mg Tabs (Simvastatin) .Marland Kitchen... 1 po once daily  CHOL: 144 (03/21/2009)   LDL: 76 (03/21/2009)   HDL: 50.90 (03/21/2009)   TG: 86.0 (03/21/2009)  Patient Instructions: 1)  Your physician has requested that you have a cardiac catheterization.  Cardiac catheterization is used to diagnose and/or treat various heart conditions. Doctors may recommend this procedure for a number of different reasons. The most common reason is to evaluate chest pain. Chest pain can be a symptom of coronary artery disease (CAD), and cardiac catheterization can show whether plaque is  narrowing or blocking your heart's arteries. This procedure is also used to evaluate the valves, as well as measure the blood flow and oxygen levels in different parts of your heart.  For further information please visit [email or web address]  Please follow instruction sheet, as given. 2)  If you develop chest pain over the weekend please go to the ER for evaluation. Prescriptions: NITROLINGUAL 0.4 MG/SPRAY SOLN (NITROGLYCERIN) One spray under tongue every 5 minutes as needed for chest pain---may repeat times three  #1 x 2   Entered by:   [name], RN, BSN   Authorized by:   Norva Karvonen, MD   Signed by:   [name], RN, BSN on 08/19/2009   Method used:   Electronically to        [facility]  [phone]* (retail)       [address]       [place], [place]  [address]       Ph: [phone] or [phone]       Fax: [phone]   RxID:   [id number]

## 2010-06-20 NOTE — Progress Notes (Signed)
Summary: PCP took her off med  Phone Note Call from Patient Call back at Work Phone [phone]   Caller: Patient Reason for Call: Talk to Nurse Summary of Call: wants to let Dr Excell Seltzer know that PCP took her off of Simvastatin.... having trouble with her legs Initial call taken by: Migdalia Dk,  November 08, 2009 11:57 AM  Follow-up for Phone Call        I spoke with the pt and she is having episodes of leg weakness.  The pt said she has had 3 bad episodes when it feels like she cannot stand up.  The pt did see Dr Yetta Barre and he recommended stopping her Simvastatin. The pt is scheduled to see Dr Posey Rea back in follow-up.  I will forward this information to Dr Excell Seltzer per the pt's request.    Follow-up by: [name], RN, BSN,  November 08, 2009 2:54 PM     Appended Document: PCP took her off med sounds appropriate - agree.

## 2010-06-20 NOTE — Assessment & Plan Note (Signed)
Summary: [facility]  Medications Added PROCARDIA XL 30 MG  TB24 (NIFEDIPINE) Take 1 by mouth once daily LOPRESSOR 50 MG TABS (METOPROLOL TARTRATE) 1 by mouth three times a day LASIX 40 MG TABS (FUROSEMIDE) 1 by mouth  two times a day ASPIR-LOW 81 MG TBEC (ASPIRIN) once daily ATIVAN 0.5 MG  TABS (LORAZEPAM) 1 by mouth at bedtime LEVOTHROID 125 MCG TABS (LEVOTHYROXINE SODIUM) 1 po once daily DOXEPIN HCL 50 MG  CAPS (DOXEPIN HCL) 3 by mouth at bedtime POTASSIUM CHLORIDE 20 MEQ  PACK (POTASSIUM CHLORIDE) 1 po two times a day SIMVASTATIN 80 MG TABS (SIMVASTATIN) 1 po once daily VITAMIN D3 400 UNIT TABS (CHOLECALCIFEROL) Take 1 by mouth once daily        Visit Type:  Follow-up Primary Provider:  Tresa Garter MD  CC:  none.  History of Present Illness: This is a 65 year old woman presented for followup of coronary artery disease status post multiple PCI procedures involving the LAD. Her most recent intervention was nearly 10 years ago. She has had stable class 1-2 angina since that time. She reports very rare episodes of chest pain.  She denies dyspnea, orthopnea, PND, or edema. Shehas taken 2-3 NTG since last visit here one year ago.  She has no other complaints today.  Current Medications (verified): 1)  Procardia Xl 30 Mg  Tb24 (Nifedipine) .... Take 1 By Mouth Once Daily 2)  Lopressor 50 Mg Tabs (Metoprolol Tartrate) .Marland Kitchen.. 1 By Mouth Three Times A Day 3)  Lasix 40 Mg Tabs (Furosemide) .Marland Kitchen.. 1 By Mouth  Two Times A Day 4)  Aspir-Low 81 Mg Tbec (Aspirin) .... Once Daily 5)  Ativan 0.5 Mg  Tabs (Lorazepam) .Marland Kitchen.. 1 By Mouth At Bedtime 6)  Levothroid 125 Mcg Tabs (Levothyroxine Sodium) .Marland Kitchen.. 1 Po Once Daily 7)  Doxepin Hcl 50 Mg  Caps (Doxepin Hcl) .... 3 By Mouth At Bedtime 8)  Potassium Chloride 20 Meq  Pack (Potassium Chloride) .Marland Kitchen.. 1 Po Two Times A Day 9)  Simvastatin 80 Mg Tabs (Simvastatin) .Marland Kitchen.. 1 Po Once Daily 10)  Depo-Estradiol 5 Mg/ml  Oil (Estradiol Cypionate) ....  Marland Kitchen75 Mg Inj Q 18 D 11)  Nitrolingual Duo Pack 0.4 Mg/spray Tl Soln (Nitroglycerin) .... As Needed As Dirr. 12)  Tranxene-T 15 Mg  Tabs (Clorazepate Dipotassium) .Marland Kitchen.. 1 By Mouth Three Times A Day Prn 13)  Vitamin D3 400 Unit Tabs (Cholecalciferol) .... Take 1 By Mouth Once Daily  Allergies: 1)  ! Amoxicillin 2)  ! * Antihistamines 3)  ! * Bellergal 4)  ! Cipro 5)  ! Codeine 6)  ! Crestor (Rosuvastatin Calcium) 7)  ! Darvocet 8)  ! Epinephrine 9)  ! Imdur 10)  ! Lidocaine 11)  ! Lipitor 12)  ! Morphine 13)  ! Tetracycline 14)  ! Benadryl  Past History:  Past medical, surgical, family and social histories (including risk factors) reviewed, and no changes noted (except as noted below).  Past Medical History:    Reviewed history from 09/06/2008 and no changes required:    ACUTE BRONCHITIS (ICD-466.0)    FAMILY HISTORY OF CAD FEMALE 1ST DEGREE RELATIVE <50 (ICD-V17.3)    CAROTID BRUIT (ICD-785.9)    DE QUERVAIN'S TENOSYNOVITIS (ICD-727.04)    HYPOTHYROIDISM (ICD-244.9)    HYPERTENSION (ICD-401.9)    HYPERLIPIDEMIA (ICD-272.4)    DEPRESSION (ICD-311)    CORONARY ARTERY DISEASE (ICD-414.00)    ANXIETY (ICD-300.00)       Past Surgical History:    Reviewed history from 03/12/2007  and no changes required:    none  Family History:    Reviewed history from 03/12/2007 and no changes required:       Family History Hypertension       Family History of CAD Female 1st degree relative <50  Social History:    Reviewed history from 03/12/2007 and no changes required:       Single       Former Smoker       Occupation: [employer] Services operational officer       Alcohol use-no  Review of Systems       10 point ROS performed, negative except as per HPI   Vital Signs:  Patient profile:   65 year old female Height:      68 inches Weight:      160 pounds BMI:     24.42 Pulse rate:   62 / minute BP sitting:   136 / 78  (left arm) Cuff size:   regular  Vitals Entered By: Stanton Kidney, EMT-P (September 07, 2008  4:32 PM)  Physical Exam  General:  Pt is alert and oriented, in no acute distress. HEENT: normal Neck: normal carotid upstrokes with a left carotid bruit, JVP normal Lungs: CTA CV: RRR without murmur or gallop Abd: soft, NT, positive BS, no bruit, no organomegaly Ext: no clubbing, cyanosis, or edema. peripheral pulses 2+ and equal Skin: warm and dry without rash    EKG  Procedure date:  09/07/2008  Findings:      NSR, WNL  Impression & Recommendations:  Problem # 1:  CORONARY ARTERY DISEASE (ICD-414.00) She reports very infrequent angina. Continue secondary risk reduction measures with treatment of her dyslipidemia and hypertension. She is on antiplatelet therapy with aspirin. Follow up in one year.  Problem # 2:  CAROTID BRUIT (ICD-785.9) mild carotid stenosis from ultrasound in September 2008. Recommend repeat study in the presence of left carotid bruit. Continue aspirin.  Problem # 3:  HYPERLIPIDEMIA (ICD-272.4)  Her updated medication list for this problem includes:    Simvastatin 80 Mg Tabs (Simvastatin) .Marland Kitchen... 1 po once daily  total cholesterol 141, triglycerides 83, HDL 49, LDL 75. Continue current therapy.  Patient Instructions: 1)  Your physician wants you to follow-up in:  1 year. You will receive a reminder letter in the mail two months in advance. If you don't receive a letter, please call our office to schedule the follow-up appointment. 2)  Your physician has requested that you have a carotid duplex. This test is an ultrasound of the carotid arteries in your neck. It looks at blood flow through these arteries that supply the brain with blood. Allow one hour for this exam. There are no restrictions or special instructions.

## 2010-06-20 NOTE — Progress Notes (Signed)
Summary: Rf lorazepam  Phone Note Refill Request Message from:  Pharmacy  Refills Requested: Medication #1:  ATIVAN 0.5 MG  TABS 1 by mouth at bedtime   Dosage confirmed as above?Dosage Confirmed   Supply Requested: 90   Last Refilled: 01/20/2010  Method Requested: Telephone to Pharmacy Next Appointment Scheduled: 03-29-10 Initial call taken by: Lanier Prude, [facility]),  March 13, 2010 8:27 AM  Follow-up for Phone Call        PT RS'D THE NOV 9 APPT TO DEC 14 DUE TO BUMP LIST. Follow-up by: Hilarie Fredrickson,  March 13, 2010 2:43 PM  Additional Follow-up for Phone Call Additional follow up Details #1::        ok X 6 ref Additional Follow-up by: Tresa Garter MD,  March 13, 2010 5:50 PM    Additional Follow-up for Phone Call Additional follow up Details #2::    rf called in Follow-up by: Lanier Prude, [facility]),  March 14, 2010 8:05 AM

## 2010-06-20 NOTE — Progress Notes (Signed)
Summary: pt was told not to start lipitor  Phone Note Call from Patient   Caller: Patient Reason for Call: Talk to Nurse Summary of Call: lauren called pt yesterday-gave her instructions to start lipitor-she saw dr Posey Rea after her appt here- he doesn't want her to start lipitor for another 30 days -pt having brain scan tomorrow-pt wanted to let you know, is this a problem?-pls call [phone] or [phone] Initial call taken by: Glynda Jaeger,  November 23, 2009 3:47 PM  Follow-up for Phone Call        I left a message at the pt's work number that she is okay to hold off on starting Lipitor at this time.  The pt is scheduled to see Dr Posey Rea again on 12/27/09.  The pt can await further instructions from Dr Posey Rea at that appt about starting Lipitor.  Follow-up by: [name], RN, BSN,  November 23, 2009 4:12 PM

## 2010-06-20 NOTE — Assessment & Plan Note (Signed)
Summary: 6 MTH FU-$50-STC   Vital Signs:  Patient Profile:   65 Years Old Female Height:     68 inches Weight:      163 pounds Temp:     97.5 degrees F oral Pulse rate:   72 / minute BP sitting:   120 / 70  (left arm)  Vitals Entered By: Tora Perches (March 22, 2008 3:13 PM)                 Chief Complaint:  Multiple medical problems or concerns.    Current Allergies (reviewed today): ! AMOXICILLIN ! * ANTIHISTAMINES ! * BELLERGAL ! CIPRO ! CODEINE ! CRESTOR (ROSUVASTATIN CALCIUM) ! DARVOCET ! EPINEPHRINE ! IMDUR ! LIDOCAINE ! LIPITOR ! MORPHINE ! TETRACYCLINE  Past Medical History:    Reviewed history from 02/11/2007 and no changes required:       Anxiety       Coronary artery disease       Depression       Hyperlipidemia       Hypertension       Hypothyroidism   Family History:    Reviewed history from 03/12/2007 and no changes required:       Family History Hypertension       Family History of CAD Female 1st degree relative <50  Social History:    Reviewed history from 03/12/2007 and no changes required:       Single       Former Smoker       Occupation: [employer] Services operational officer       Alcohol use-no     Physical Exam  General:     Well-developed,well-nourished,in no acute distress; alert,appropriate and cooperative throughout examination Ears:     External ear exam shows no significant lesions or deformities.  Otoscopic examination reveals clear canals, tympanic membranes are intact bilaterally without bulging, retraction, inflammation or discharge. Hearing is grossly normal bilaterally. Nose:     Swollen mucosa Mouth:     Eryth throat Neck:     No deformities, masses, or tenderness noted. Lungs:     Normal respiratory effort, chest expands symmetrically. Lungs are clear to auscultation, no crackles or wheezes. Heart:     Normal rate and regular rhythm. S1 and S2 normal without gallop, murmur, click, rub or other extra sounds. Abdomen:     Bowel  sounds positive,abdomen soft and non-tender without masses, organomegaly or hernias noted. Msk:     No deformity or scoliosis noted of thoracic or lumbar spine.   Extremities:     No clubbing, cyanosis, edema, or deformity noted with normal full range of motion of all joints.   Skin:     Intact without suspicious lesions or rashes Psych:     Cognition and judgment appear intact. Alert and cooperative with normal attention span and concentration. No apparent delusions, illusions, hallucinations    Impression & Recommendations:  Problem # 1:  HYPOTHYROIDISM (ICD-244.9) Assessment: Unchanged  Her updated medication list for this problem includes:    Levothroid 125 Mcg Tabs (Levothyroxine sodium) .Marland Kitchen... 1 po qd   Problem # 2:  HYPERTENSION (ICD-401.9) Assessment: Unchanged  Her updated medication list for this problem includes:    Procardia Xl 30 Mg Tb24 (Nifedipine) .Marland Kitchen... 1 po qd    Lopressor 50 Mg Tabs (Metoprolol tartrate) .Marland Kitchen... 1 po tid    Lasix 40 Mg Tabs (Furosemide) .Marland Kitchen... 1 po bid   Problem # 3:  HYPERLIPIDEMIA (ICD-272.4) Assessment: Improved The labs were reviewd  with the patient.  Her updated medication list for this problem includes:    Simvastatin 80 Mg Tabs (Simvastatin) .Marland Kitchen... 1 po qd   Problem # 4:  DEPRESSION (ICD-311) Assessment: Unchanged  Her updated medication list for this problem includes:    Ativan 0.5 Mg Tabs (Lorazepam) .Marland Kitchen... 1 po qhs    Doxepin Hcl 50 Mg Caps (Doxepin hcl) .Marland KitchenMarland KitchenMarland KitchenMarland Kitchen 3 by mouth q hs    Tranxene-t 15 Mg Tabs (Clorazepate dipotassium) .Marland Kitchen... 1 by mouth three times a day prn   Problem # 5:  CORONARY ARTERY DISEASE (ICD-414.00) Assessment: Comment Only  Her updated medication list for this problem includes:    Procardia Xl 30 Mg Tb24 (Nifedipine) .Marland Kitchen... 1 po qd    Lopressor 50 Mg Tabs (Metoprolol tartrate) .Marland Kitchen... 1 po tid    Lasix 40 Mg Tabs (Furosemide) .Marland Kitchen... 1 po bid    Aspir-low 81 Mg Tbec (Aspirin) ..... Qd    Nitrolingual Duo Pack  0.4 Mg/spray Tl Soln (Nitroglycerin) .Marland Kitchen... As needed as dirr.    Aspirin 81 Mg Tbec (Aspirin) ..... One by mouth every day   Complete Medication List: 1)  Procardia Xl 30 Mg Tb24 (Nifedipine) .Marland Kitchen.. 1 po qd 2)  Lopressor 50 Mg Tabs (Metoprolol tartrate) .Marland Kitchen.. 1 po tid 3)  Lasix 40 Mg Tabs (Furosemide) .Marland Kitchen.. 1 po bid 4)  Aspir-low 81 Mg Tbec (Aspirin) .... Qd 5)  Ativan 0.5 Mg Tabs (Lorazepam) .Marland Kitchen.. 1 po qhs 6)  Levothroid 125 Mcg Tabs (Levothyroxine sodium) .Marland Kitchen.. 1 po qd 7)  Doxepin Hcl 50 Mg Caps (Doxepin hcl) .... 3 by mouth q hs 8)  Potassium Chloride 20 Meq Pack (Potassium chloride) .Marland Kitchen.. 1 po bid 9)  Simvastatin 80 Mg Tabs (Simvastatin) .Marland Kitchen.. 1 po qd 10)  Depo-estradiol 5 Mg/ml Oil (Estradiol cypionate) .... Marland Kitchen75 mg inj q 18 d 11)  Zithromax 500 Mg Tabs (Azithromycin) .... Qd 12)  Tessalon Perles 100 Mg Caps (Benzonatate) .... Three times a day as needed cough 13)  Nitrolingual Duo Pack 0.4 Mg/spray Tl Soln (Nitroglycerin) .... As needed as dirr. 14)  Tranxene-t 15 Mg Tabs (Clorazepate dipotassium) .Marland Kitchen.. 1 by mouth three times a day prn 15)  Aspirin 81 Mg Tbec (Aspirin) .... One by mouth every day 16)  Vitamin D3 1000 Unit Tabs (Cholecalciferol) .Marland Kitchen.. 1 by mouth daily   Patient Instructions: 1)  Please schedule a follow-up appointment in 6 months. 2)  BMP prior to visit, ICD-9: 3)  Hepatic Panel prior to visit, ICD-9: 4)  Lipid Panel prior to visit, ICD-9: 790.92 5)  TSH prior to visit, ICD-9: 6)  CBC w/ Diff prior to visit, ICD-9:414.8  7)  Urine-dip prior to visit, ICD-9:   ]

## 2010-06-20 NOTE — Assessment & Plan Note (Signed)
Summary: f1y   Visit Type:  Follow-up Primary Provider:  Georgina Quint Plotnikov MD  CC:  no complaints.  History of Present Illness: Is a 65 year old woman with coronary artery disease status post remote percutaneous intervention of the LAD. She recently was seen for worsening chest pain, typical of crescendo angina. She underwent cardiac catheterization demonstrating widely patent coronary arteries with only mild nonobstructive stenosis. After a long discussion with her following the catheterization procedure, I suspect that her chest discomfort was do to a great deal of emotional stress. She has felt better with the relief of knowing her coronaries are patent. She denies recurrent chest pain, dyspnea, or other complaints at present. She is back to work without problems.  Current Medications (verified): 1)  Procardia Xl 30 Mg  Tb24 (Nifedipine) .... Take 1 By Mouth Once Daily 2)  Lopressor 50 Mg Tabs (Metoprolol Tartrate) .Marland Kitchen.. 1 By Mouth Three Times A Day 3)  Lasix 40 Mg Tabs (Furosemide) .Marland Kitchen.. 1 By Mouth  Two Times A Day 4)  Aspir-Low 81 Mg Tbec (Aspirin) .... Once Daily 5)  Ativan 0.5 Mg  Tabs (Lorazepam) .Marland Kitchen.. 1 By Mouth At Bedtime 6)  Levothroid 125 Mcg Tabs (Levothyroxine Sodium) .Marland Kitchen.. 1 Po Once Daily 7)  Doxepin Hcl 50 Mg  Caps (Doxepin Hcl) .... 3 By Mouth At Bedtime 8)  Potassium Chloride 20 Meq  Pack (Potassium Chloride) .Marland Kitchen.. 1 Po Two Times A Day 9)  Simvastatin 80 Mg Tabs (Simvastatin) .Marland Kitchen.. 1 Po Once Daily 10)  Depo-Estradiol 5 Mg/ml  Oil (Estradiol Cypionate) .... Marland Kitchen25 Mg Inj Q 18-25 Days 11)  Nitrolingual 0.4 Mg/spray Soln (Nitroglycerin) .... One Spray Under Tongue Every 5 Minutes As Needed For Chest Pain---May Repeat Times Three 12)  Tranxene-T 15 Mg  Tabs (Clorazepate Dipotassium) .Marland Kitchen.. 1 By Mouth Three Times A Day Prn 13)  Vitamin D3 1000 Unit Tabs (Cholecalciferol) .... Once Daily  Allergies (verified): 1)  ! Amoxicillin 2)  ! * Antihistamines 3)  ! * Bellergal 4)  ! Cipro 5)   ! Codeine 6)  ! Crestor (Rosuvastatin Calcium) 7)  ! Darvocet 8)  ! Epinephrine 9)  ! Imdur 10)  ! Lidocaine 11)  ! Lipitor 12)  ! Morphine 13)  ! Tetracycline 14)  ! Benadryl 15)  ! Lidocaine  Past History:  Past medical history reviewed for relevance to current acute and chronic problems.  Past Medical History: Reviewed history from 08/19/2009 and no changes required. ACUTE BRONCHITIS (ICD-466.0) FAMILY HISTORY OF CAD FEMALE 1ST DEGREE RELATIVE <50 (ICD-V17.3) CAROTID BRUIT (ICD-785.9) DE QUERVAIN'S TENOSYNOVITIS (ICD-727.04) HYPOTHYROIDISM (ICD-244.9) HYPERTENSION (ICD-401.9) HYPERLIPIDEMIA (ICD-272.4) DEPRESSION (ICD-311) CORONARY ARTERY DISEASE (ICD-414.00) s/p multiple angioplasty procedures in the 90's/early 2000's. ANXIETY (ICD-300.00)    Vital Signs:  Patient profile:   65 year old female Height:      68 inches Weight:      164 pounds BMI:     25.03 Pulse rate:   70 / minute BP sitting:   120 / 70  (left arm) Cuff size:   regular  Vitals Entered By: Hardin Negus, RMA (September 02, 2009 3:50 PM)  Physical Exam  General:  The right radial site is clear. Reverse Allen's test is normal. There is no hematoma or ecchymosis present.   Impression & Recommendations:  Problem # 1:  CORONARY ARTERY DISEASE (ICD-414.00) Stable anatomy on recent cardiac catheterization. Will continue her current medical program. We discussed a trial of long-acting nitrate for the possibility of microvascular angina, but the patient has  not tolerated this well in the past and declined it. I would favor continuing on her current program unless she has recurrent symptoms.  Her updated medication list for this problem includes:    Procardia Xl 30 Mg Tb24 (Nifedipine) .Marland Kitchen... Take 1 by mouth once daily    Lopressor 50 Mg Tabs (Metoprolol tartrate) .Marland Kitchen... 1 by mouth three times a day    Aspir-low 81 Mg Tbec (Aspirin) ..... Once daily    Nitrolingual 0.4 Mg/spray Soln (Nitroglycerin) ..... One  spray under tongue every 5 minutes as needed for chest pain---may repeat times three  Problem # 2:  HYPERTENSION (ICD-401.9) Stable on current medical therapy.  Her updated medication list for this problem includes:    Procardia Xl 30 Mg Tb24 (Nifedipine) .Marland Kitchen... Take 1 by mouth once daily    Lopressor 50 Mg Tabs (Metoprolol tartrate) .Marland Kitchen... 1 by mouth three times a day    Lasix 40 Mg Tabs (Furosemide) .Marland Kitchen... 1 by mouth  two times a day    Aspir-low 81 Mg Tbec (Aspirin) ..... Once daily  BP today: 120/70 Prior BP: 144/74 (08/19/2009)  Labs Reviewed: K+: 3.9 (03/21/2009) Creat: : 1.0 (03/21/2009)   Chol: 144 (03/21/2009)   HDL: 50.90 (03/21/2009)   LDL: 76 (03/21/2009)   TG: 86.0 (03/21/2009)  Patient Instructions: 1)  Your physician recommends that you schedule a follow-up appointment in:  1 YEAR

## 2010-06-20 NOTE — Progress Notes (Signed)
Summary: RFs  Phone Note Refill Request   Refills Requested: Medication #1:  ATIVAN 0.5 MG  TABS 1 po qhs  Medication #2:  TRANXENE-T 15 MG  TABS 1 by mouth three times a day prn Initial call taken by: Lamar Sprinkles,  March 30, 2008 9:21 AM      Prescriptions: TRANXENE-T 15 MG  TABS (CLORAZEPATE DIPOTASSIUM) 1 by mouth three times a day prn  #90 x 6   Entered by:   Tora Perches   Authorized by:   Tresa Garter MD   Signed by:   Tora Perches on 03/30/2008   Method used:   Telephoned to ...       [facility]  [address]  [phone]* (retail)       [address]       [place], [place]  [address]       Ph: [phone] or [phone]       Fax: [phone]   RxID:   [id number] ATIVAN 0.5 MG  TABS (LORAZEPAM) 1 po qhs  #30 x 6   Entered by:   Tora Perches   Authorized by:   Tresa Garter MD   Signed by:   Tora Perches on 03/30/2008   Method used:   Telephoned to ...       [facility]  [address]  [phone]* (retail)       [address]       [place], [place]  [address]       Ph: [phone] or [phone]       Fax: [phone]   RxID:   [id number]

## 2010-06-20 NOTE — Progress Notes (Signed)
Summary: Does pt need carotid  Phone Note Call from Patient Call back at Home Phone [phone]   Caller: Patient Reason for Call: Talk to Nurse, Talk to Doctor Summary of Call: I sent letter to schedule pt for carotid study she wanted to hear from MD herself because she was in the hospital and he didn't say anything about that and she thinks I am scheduleing something not needed and she wants to make sure Dr. Excell Seltzer knows what I'm doing Initial call taken by: Omer Jack,  September 12, 2009 10:22 AM  Follow-up for Phone Call        Last carotid 10/05/08--recommended f/u in 1 year--will review with Dr Excell Seltzer to see if this needs to be followed so closely.  [name], RN, BSN  September 12, 2009 12:43 PM  Additional Follow-up for Phone Call Additional follow up Details #1::        stable mild disease over a 2 year period. can be rechecked in 2012 and if still less than 40%, no further f/u needed. Additional Follow-up by: Norva Karvonen, MD,  September 12, 2009 6:03 PM    Additional Follow-up for Phone Call Additional follow up Details #2::    I spoke with the pt and made her aware that her carotid could be repeated in 2012.   Follow-up by: [name], RN, BSN,  September 12, 2009 6:25 PM

## 2010-06-20 NOTE — Assessment & Plan Note (Signed)
Summary: 6 mos f/u/#/cd   Vital Signs:  Patient profile:   65 year old female Height:      68 inches Weight:      162.50 pounds BMI:     24.80 O2 Sat:      96 % on Room air Temp:     97.0 degrees F oral Pulse rate:   70 / minute BP sitting:   130 / 84  (left arm) Cuff size:   regular  Vitals Entered By: Lucious Groves (Oct 03, 2009 2:49 PM)  O2 Flow:  Room air CC: 6 mo f/u./kb Is Patient Diabetic? No Pain Assessment Patient in pain? no        Primary Care Provider:  Georgina Quint Plotnikov MD  CC:  6 mo f/u./kb.  History of Present Illness: The patient presents for a post-hospital visit for  DISCHARGE DIAGNOSIS:  Chest pain.   SECONDARY DIAGNOSES: 1. Nonobstructive coronary artery disease. 2. History of carotid bruit. 3. Hypothyroidism. 4. Hypertension. 5. Hyperlipidemia. 6. Depression. 7. Anxiety. 8. Lollie Sails tenosynovitis. 9. History of bronchitis.  Cath was OK    Current Medications (verified): 1)  Procardia Xl 30 Mg  Tb24 (Nifedipine) .... Take 1 By Mouth Once Daily 2)  Lopressor 50 Mg Tabs (Metoprolol Tartrate) .Marland Kitchen.. 1 By Mouth Three Times A Day 3)  Lasix 40 Mg Tabs (Furosemide) .Marland Kitchen.. 1 By Mouth  Two Times A Day 4)  Aspir-Low 81 Mg Tbec (Aspirin) .... Once Daily 5)  Ativan 0.5 Mg  Tabs (Lorazepam) .Marland Kitchen.. 1 By Mouth At Bedtime 6)  Levothroid 125 Mcg Tabs (Levothyroxine Sodium) .Marland Kitchen.. 1 Po Once Daily 7)  Doxepin Hcl 50 Mg  Caps (Doxepin Hcl) .... 3 By Mouth At Bedtime 8)  Potassium Chloride 20 Meq  Pack (Potassium Chloride) .Marland Kitchen.. 1 Po Two Times A Day 9)  Simvastatin 80 Mg Tabs (Simvastatin) .Marland Kitchen.. 1 Po Once Daily 10)  Depo-Estradiol 5 Mg/ml  Oil (Estradiol Cypionate) .... Marland Kitchen25 Mg Inj Q 18-25 Days 11)  Nitrolingual 0.4 Mg/spray Soln (Nitroglycerin) .... One Spray Under Tongue Every 5 Minutes As Needed For Chest Pain---May Repeat Times Three 12)  Tranxene-T 15 Mg  Tabs (Clorazepate Dipotassium) .Marland Kitchen.. 1 By Mouth Three Times A Day Prn 13)  Vitamin D3 1000 Unit Tabs  (Cholecalciferol) .... Once Daily  Allergies (verified): 1)  ! Amoxicillin 2)  ! * Antihistamines 3)  ! * Bellergal 4)  ! Cipro 5)  ! Codeine 6)  ! Crestor (Rosuvastatin Calcium) 7)  ! Darvocet 8)  ! Epinephrine 9)  ! Imdur 10)  ! Lidocaine 11)  ! Lipitor 12)  ! Morphine 13)  ! Tetracycline 14)  ! Benadryl 15)  ! Lidocaine 16)  Xanax (Alprazolam)  Past History:  Past Medical History: Last updated: 08/19/2009 ACUTE BRONCHITIS (ICD-466.0) FAMILY HISTORY OF CAD FEMALE 1ST DEGREE RELATIVE <50 (ICD-V17.3) CAROTID BRUIT (ICD-785.9) DE QUERVAIN'S TENOSYNOVITIS (ICD-727.04) HYPOTHYROIDISM (ICD-244.9) HYPERTENSION (ICD-401.9) HYPERLIPIDEMIA (ICD-272.4) DEPRESSION (ICD-311) CORONARY ARTERY DISEASE (ICD-414.00) s/p multiple angioplasty procedures in the 90's/early 2000's. ANXIETY (ICD-300.00)    Social History: Last updated: 03/12/2007 Single Former Smoker Occupation: [employer] operational officer Alcohol use-no  Review of Systems  The patient denies fever, dyspnea on exertion, and abdominal pain.         No more CPs  Physical Exam  General:  Pt is alert and oriented, in no acute distress. HEENT: normal Neck: normal carotid upstrokes with a left carotid bruit, JVP normal Lungs: CTA CV: RRR without murmur or gallop Abd: soft, NT, positive BS, no  bruit, no organomegaly Ext: no clubbing, cyanosis, or edema. peripheral pulses 2+ and equal Skin: warm and dry without rash  Eyes:  No corneal or conjunctival inflammation noted. EOMI. Perrla.  Mouth:  Oral mucosa and oropharynx without lesions or exudates.  Teeth in good repair. Lungs:  Normal respiratory effort, chest expands symmetrically. Lungs are clear to auscultation, no crackles or wheezes. Heart:  Normal rate and regular rhythm. S1 and S2 normal without gallop, murmur, click, rub or other extra sounds. Abdomen:  Bowel sounds positive,abdomen soft and non-tender without masses, organomegaly or hernias noted. Msk:  No deformity or  scoliosis noted of thoracic or lumbar spine.   Neurologic:  No cranial nerve deficits noted. Station and gait are normal. Plantar reflexes are down-going bilaterally. DTRs are symmetrical throughout. Sensory, motor and coordinative functions appear intact. Skin:  Intact without suspicious lesions or rashes Psych:  Cognition and judgment appear intact. Alert and cooperative with normal attention span and concentration. No apparent delusions, illusions, hallucinations   Impression & Recommendations:  Problem # 1:  CORONARY ARTERY DISEASE (ICD-414.00) Assessment Unchanged S/p recent Card Cath - no change Her updated medication list for this problem includes:    Procardia Xl 30 Mg Tb24 (Nifedipine) .Marland Kitchen... Take 1 by mouth once daily    Lopressor 50 Mg Tabs (Metoprolol tartrate) .Marland Kitchen... 1 by mouth three times a day    Lasix 40 Mg Tabs (Furosemide) .Marland Kitchen... 1 by mouth  two times a day    Aspir-low 81 Mg Tbec (Aspirin) ..... Once daily    Nitrolingual 0.4 Mg/spray Soln (Nitroglycerin) ..... One spray under tongue every 5 minutes as needed for chest pain---may repeat times three  Problem # 2:  ANXIETY (ICD-300.00) Assessment: Unchanged  Her updated medication list for this problem includes:    Ativan 0.5 Mg Tabs (Lorazepam) .Marland Kitchen... 1 by mouth at bedtime    Doxepin Hcl 50 Mg Caps (Doxepin hcl) .Marland KitchenMarland KitchenMarland KitchenMarland Kitchen 3 by mouth at bedtime    Tranxene-t 15 Mg Tabs (Clorazepate dipotassium) .Marland Kitchen... 1 by mouth three times a day prn  Problem # 3:  HYPERLIPIDEMIA (ICD-272.4) Assessment: Unchanged  Her updated medication list for this problem includes:    Simvastatin 80 Mg Tabs (Simvastatin) .Marland Kitchen... 1 po once daily  Problem # 4:  HYPOTHYROIDISM (ICD-244.9) Assessment: Unchanged  Her updated medication list for this problem includes:    Levothroid 125 Mcg Tabs (Levothyroxine sodium) .Marland Kitchen... 1 po once daily  Problem # 5:  HYPERTENSION (ICD-401.9) Assessment: Unchanged  Her updated medication list for this problem includes:     Procardia Xl 30 Mg Tb24 (Nifedipine) .Marland Kitchen... Take 1 by mouth once daily    Lopressor 50 Mg Tabs (Metoprolol tartrate) .Marland Kitchen... 1 by mouth three times a day    Lasix 40 Mg Tabs (Furosemide) .Marland Kitchen... 1 by mouth  two times a day  Complete Medication List: 1)  Procardia Xl 30 Mg Tb24 (Nifedipine) .... Take 1 by mouth once daily 2)  Lopressor 50 Mg Tabs (Metoprolol tartrate) .Marland Kitchen.. 1 by mouth three times a day 3)  Lasix 40 Mg Tabs (Furosemide) .Marland Kitchen.. 1 by mouth  two times a day 4)  Aspir-low 81 Mg Tbec (Aspirin) .... Once daily 5)  Ativan 0.5 Mg Tabs (Lorazepam) .Marland Kitchen.. 1 by mouth at bedtime 6)  Levothroid 125 Mcg Tabs (Levothyroxine sodium) .Marland Kitchen.. 1 po once daily 7)  Doxepin Hcl 50 Mg Caps (Doxepin hcl) .... 3 by mouth at bedtime 8)  Simvastatin 80 Mg Tabs (Simvastatin) .Marland Kitchen.. 1 po once daily 9)  Depo-estradiol 5 Mg/ml Oil (Estradiol cypionate) .... Marland Kitchen25 mg inj q 18-25 days 10)  Nitrolingual 0.4 Mg/spray Soln (Nitroglycerin) .... One spray under tongue every 5 minutes as needed for chest pain---may repeat times three 11)  Tranxene-t 15 Mg Tabs (Clorazepate dipotassium) .Marland Kitchen.. 1 by mouth three times a day prn 12)  Vitamin D3 1000 Unit Tabs (Cholecalciferol) .... Once daily 13)  Klor-con M20 20 Meq Cr-tabs (Potassium chloride crys cr) .Marland Kitchen.. 1 by mouth bid  Patient Instructions: 1)  Please schedule a follow-up appointment in 6 months well w/labs. Prescriptions: KLOR-CON M20 20 MEQ CR-TABS (POTASSIUM CHLORIDE CRYS CR) 1 by mouth bid  #180 x 3   Entered and Authorized by:   Tresa Garter MD   Signed by:   Tresa Garter MD on 10/03/2009   Method used:   Print then Give to Patient   RxID:   [id number] TRANXENE-T 15 MG  TABS (CLORAZEPATE DIPOTASSIUM) 1 by mouth three times a day prn  #270 x 1   Entered and Authorized by:   Tresa Garter MD   Signed by:   Tresa Garter MD on 10/03/2009   Method used:   Print then Give to Patient   RxID:   [id number] NITROLINGUAL 0.4 MG/SPRAY SOLN  (NITROGLYCERIN) One spray under tongue every 5 minutes as needed for chest pain---may repeat times three  #1 x 3   Entered and Authorized by:   Tresa Garter MD   Signed by:   Tresa Garter MD on 10/03/2009   Method used:   Print then Give to Patient   RxID:   [id number] DEPO-ESTRADIOL 5 MG/ML  OIL (ESTRADIOL CYPIONATE) .25 mg inj q 18-25 days  #qs 3 mo x 3   Entered and Authorized by:   Tresa Garter MD   Signed by:   Tresa Garter MD on 10/03/2009   Method used:   Print then Give to Patient   RxID:   [id number] SIMVASTATIN 80 MG TABS (SIMVASTATIN) 1 po once daily  #90 x 3   Entered and Authorized by:   Tresa Garter MD   Signed by:   Tresa Garter MD on 10/03/2009   Method used:   Print then Give to Patient   RxID:   [id number] DOXEPIN HCL 50 MG  CAPS (DOXEPIN HCL) 3 by mouth at bedtime  #90 x 3   Entered and Authorized by:   Tresa Garter MD   Signed by:   Tresa Garter MD on 10/03/2009   Method used:   Print then Give to Patient   RxID:   [id number] LEVOTHROID 125 MCG TABS (LEVOTHYROXINE SODIUM) 1 po once daily  #90 x 3   Entered and Authorized by:   Tresa Garter MD   Signed by:   Tresa Garter MD on 10/03/2009   Method used:   Print then Give to Patient   RxID:   [id number] ATIVAN 0.5 MG  TABS (LORAZEPAM) 1 by mouth at bedtime  #90 x 1   Entered and Authorized by:   Tresa Garter MD   Signed by:   Tresa Garter MD on 10/03/2009   Method used:   Print then Give to Patient   RxID:   [id number] LASIX 40 MG TABS (FUROSEMIDE) 1 by mouth  two times a day  #60 Each x 12   Entered and Authorized by:   Tresa Garter MD   Signed by:   Macarthur Critchley  V Plotnikov MD on 10/03/2009   Method used:   Print then Give to Patient   RxID:   [id number] LOPRESSOR 50 MG TABS (METOPROLOL TARTRATE) 1 by mouth three times a day  #90 Each x 12   Entered and Authorized by:   Tresa Garter MD   Signed by:   Tresa Garter MD on 10/03/2009   Method used:   Print then Give to Patient   RxID:   [id number] PROCARDIA XL 30 MG  TB24 (NIFEDIPINE) Take 1 by mouth once daily  #90 x 3   Entered and Authorized by:   Tresa Garter MD   Signed by:   Tresa Garter MD on 10/03/2009   Method used:   Print then Give to Patient   RxID:   [id number]

## 2010-06-22 NOTE — Progress Notes (Signed)
Summary: c/o chest tightness  Phone Note Call from Patient Call back at Home Phone [phone]   Caller: Patient Reason for Call: Talk to Nurse Summary of Call: pt went to exercise around 2:15 p.m. - c/o chest tightness.  Initial call taken by: Lorne Skeens,  June 15, 2010 3:56 PM  Follow-up for Phone Call        Pt tried a new exercise class today involving arm exercises and experienced some chest pain. She kept exercising and the pain did not progress. Went home and took NTG...no problems since. She has been sedentary over the past few months. Recommended to avoid arm exercises for now and to start a slow walking program. Will schedule for next available appt and check an EKG when she comes in. She has a hx of chronic CP and cath within the past year showing nonobstructive disease. She will call if CP recurs. Follow-up by: Norva Karvonen, MD,  June 15, 2010 5:15 PM  Additional Follow-up for Phone Call Additional follow up Details #1::        I spoke with the pt and made her aware of appt on 06/23/10 at 11:15 with Dr Excell Seltzer. [name], RN, BSN  June 16, 2010 12:54 PM

## 2010-06-22 NOTE — Progress Notes (Signed)
Summary: Rf Tranxene  Phone Note Refill Request Message from:  Pharmacy  Refills Requested: Medication #1:  TRANXENE-T 15 MG  TABS 1 by mouth three times a day prn   Dosage confirmed as above?Dosage Confirmed   Supply Requested: 270   Last Refilled: 03/08/2010  Method Requested: Telephone to Pharmacy Next Appointment Scheduled: 06-07-10 Initial call taken by: Lanier Prude, [facility]),  May 25, 2010 8:29 AM  Follow-up for Phone Call        OK x6 months  Follow-up by: Tresa Garter MD,  May 25, 2010 12:48 PM    Prescriptions: TRANXENE-T 15 MG  TABS (CLORAZEPATE DIPOTASSIUM) 1 by mouth three times a day prn  #270 x 1   Entered by:   Lanier Prude, CMA(AAMA)   Authorized by:   Tresa Garter MD   Signed by:   Lanier Prude, CMA(AAMA) on 05/25/2010   Method used:   Telephoned to ...       [facility]  [address]  [phone]* (retail)       [address]       [place], [place]  [address]       Ph: [phone] or [phone]       Fax: [phone]   RxID:   [id number]

## 2010-06-23 ENCOUNTER — Encounter: Payer: Self-pay | Admitting: Cardiovascular Disease

## 2010-06-23 ENCOUNTER — Ambulatory Visit (INDEPENDENT_AMBULATORY_CARE_PROVIDER_SITE_OTHER): Payer: Medicare Other | Admitting: Cardiovascular Disease

## 2010-06-23 DIAGNOSIS — I2510 Atherosclerotic heart disease of native coronary artery without angina pectoris: Secondary | ICD-10-CM

## 2010-06-23 DIAGNOSIS — E7800 Pure hypercholesterolemia, unspecified: Secondary | ICD-10-CM

## 2010-06-23 NOTE — Miscellaneous (Signed)
Summary: Health Care Power of [facility] Power of Attorney   Imported By: Marylou Mccoy 08/22/2009 16:23:13  _____________________________________________________________________  External Attachment:    Type:   Image     Comment:   External Document

## 2010-06-28 ENCOUNTER — Encounter: Payer: Self-pay | Admitting: Cardiovascular Disease

## 2010-06-28 ENCOUNTER — Encounter (INDEPENDENT_AMBULATORY_CARE_PROVIDER_SITE_OTHER): Payer: Medicare Other

## 2010-06-28 DIAGNOSIS — I6529 Occlusion and stenosis of unspecified carotid artery: Secondary | ICD-10-CM

## 2010-06-28 NOTE — Assessment & Plan Note (Signed)
Summary: f/u on CP--per triage call   Vital Signs:  Patient profile:   65 year old female Height:      68 inches Weight:      167.50 pounds BMI:     25.56 Pulse rate:   59 / minute Pulse rhythm:   regular Resp:     18 per minute BP sitting:   134 / 78  (left arm) Cuff size:   large  Vitals Entered By: Vikki Ports (June 23, 2010 11:52 AM)  Visit Type:  Follow-up Primary Provider:  Tresa Garter MD  CC:  Chest pains last thursday relief with nitrog.Marland Kitchen  History of Present Illness: This is a 65 year old woman with coronary artery disease status post remote percutaneous intervention of the LAD. She underwent cardiac cath last year showing widely patent coronary arteries.  She is intolerant to lipid-lowering medications because of leg weakness. This has occured with statins and ezetemide.  The patient started an exercise program last week and experienced chest discomfort during arm exercises. She exercised for 45 minutes but felt tightness around her sides after about 10 minutes. She did not have central chest pressure. This was her first exercise after a sedentary lifestyle over the [place]. She has subsequently begun a walking program and has had no problems with that level of exertion. She walks 1/4 mile twice daily.    Current Medications (verified): 1)  Procardia Xl 30 Mg  Tb24 (Nifedipine) .... Take 1 By Mouth Once Daily 2)  Lopressor 50 Mg Tabs (Metoprolol Tartrate) .Marland Kitchen.. 1 By Mouth Three Times A Day 3)  Lasix 40 Mg Tabs (Furosemide) .Marland Kitchen.. 1 By Mouth  Two Times A Day 4)  Aspir-Low 81 Mg Tbec (Aspirin) .... Once Daily 5)  Ativan 0.5 Mg  Tabs (Lorazepam) .Marland Kitchen.. 1 By Mouth At Bedtime 6)  Levothroid 125 Mcg Tabs (Levothyroxine Sodium) .Marland Kitchen.. 1 Po Once Daily 7)  Doxepin Hcl 50 Mg  Caps (Doxepin Hcl) .... 3 By Mouth At Bedtime 8)  Depo-Estradiol 5 Mg/ml  Oil (Estradiol Cypionate) .... Marland Kitchen25 Mg Inj Q 18-25 Days 9)  Nitrolingual 0.4 Mg/spray Soln (Nitroglycerin) .... One Spray Under  Tongue Every 5 Minutes As Needed For Chest Pain---May Repeat Times Three 10)  Tranxene-T 15 Mg  Tabs (Clorazepate Dipotassium) .Marland Kitchen.. 1 By Mouth Three Times A Day Prn 11)  Vitamin D3 1000 Unit Tabs (Cholecalciferol) .... Once Daily 12)  Klor-Con M20 20 Meq Cr-Tabs (Potassium Chloride Crys Cr) .Marland Kitchen.. 1 By Mouth Bid 13)  Triamcinolone Acetonide 0.5 % Oint (Triamcinolone Acetonide) .... Use Two Times A Day As Needed 14)  Ibuprofen 400 Mg Tabs (Ibuprofen) .Marland Kitchen.. 1 By Mouth Two Times A Day Pc 15)  Tramadol Hcl 50 Mg Tabs (Tramadol Hcl) .Marland Kitchen.. 1-2 Tabs By Mouth Two Times A Day As Needed Pain  Allergies: 1)  ! Amoxicillin 2)  ! * Antihistamines 3)  ! * Bellergal 4)  ! Cipro 5)  ! Codeine 6)  ! Darvocet 7)  ! Epinephrine 8)  ! Imdur 9)  ! Lidocaine 10)  ! Morphine 11)  ! Tetracycline 12)  ! Benadryl 13)  ! Lidocaine 14)  Crestor (Rosuvastatin Calcium) 15)  Lipitor 16)  Xanax (Alprazolam) 17)  Simvastatin 18)  * Zetia 19)  * Vit B12 Tabs  Past History:  Past medical history reviewed for relevance to current acute and chronic problems.  Past Medical History: Reviewed history from 06/20/2010 and no changes required. ACUTE BRONCHITIS (ICD-466.0) FAMILY HISTORY OF CAD FEMALE 1ST DEGREE RELATIVE <  50 (ICD-V17.3) CAROTID BRUIT (ICD-785.9) DE QUERVAIN'S TENOSYNOVITIS (ICD-727.04) HYPOTHYROIDISM (ICD-244.9) HYPERTENSION (ICD-401.9) HYPERLIPIDEMIA (ICD-272.4) DEPRESSION (ICD-311) CORONARY ARTERY DISEASE (ICD-414.00) s/p multiple angioplasty procedures in the 90's/early 2000's. ANXIETY (ICD-300.00) Prolapsed bladder Dr Henderson Cloud Vaginal lichen 2011    Review of Systems       Positive for low back soreness, otherwise negative except as per HPI   Physical Exam  General:  Pt is alert and oriented, in no acute distress. HEENT: normal Neck: normal carotid upstrokes without bruits, JVP normal Lungs: CTA CV: RRR without murmur or gallop Abd: soft, NT, positive BS, no bruit, no  organomegaly Ext: no clubbing, cyanosis, or edema. peripheral pulses 2+ and equal Skin: warm and dry without rash    Impression & Recommendations:  Problem # 1:  CORONARY ARTERY DISEASE (ICD-414.00) The patient is stable. She had cardiac cath last year demonstrating nonobstructive disease. I don't think her episode with arm exercises was anginal in nature. Her EKG remains normal. Will followup in one year.   Her updated medication list for this problem includes:    Procardia Xl 30 Mg Tb24 (Nifedipine) .Marland Kitchen... Take 1 by mouth once daily    Lopressor 50 Mg Tabs (Metoprolol tartrate) .Marland Kitchen... 1 by mouth three times a day    Aspir-low 81 Mg Tbec (Aspirin) ..... Once daily    Nitrolingual 0.4 Mg/spray Soln (Nitroglycerin) ..... One spray under tongue every 5 minutes as needed for chest pain---may repeat times three  Problem # 2:  HYPERLIPIDEMIA (ICD-272.4) Pt is statin intolerant, also intolerant to zetia and lovaza. Her LDL and total cholesterol are markedly elevated. Recommend a trial of Red Yeast Rice - she will follow instructions as directed. Will followup lipids and LFT's in 2 months. She was advised the same side effects could occur with Red Yeast Rice but this is generally better tolerated.  CHOL: 264 (06/14/2010)   LDL: 186      HDL: 54.70 (42/59/5638)   TG: 254.0 (06/14/2010) CRP: 2.29 mg/L (11/03/2009)     Other Orders: Carotid Duplex (Carotid Duplex)  Patient Instructions: 1)  Your physician recommends that you return for a FASTING LIPID and LIVER Profile in 8 WEEKS (414.01, 272.0)--Nothing to eat or drink after midnight, lab opens at 8:30.  2)  Your physician has recommended you make the following change in your medication: START Red yeast rice (follow directions on the bottle) 3)  Your physician wants you to follow-up in: 1 YEAR.   You will receive a reminder letter in the mail two months in advance. If you don't receive a letter, please call our office to schedule the follow-up  appointment. 4)  Your physician has requested that you have a carotid duplex. This test is an ultrasound of the carotid arteries in your neck. It looks at blood flow through these arteries that supply the brain with blood. Allow one hour for this exam. There are no restrictions or special instructions.   Orders Added: 1)  Carotid Duplex [Carotid Duplex]     EKG  Procedure date:  06/23/2010  Findings:      NSR within normal limits 59 bpm.

## 2010-06-28 NOTE — Assessment & Plan Note (Signed)
Summary: CPX/ RS'D FROM BUMP LIST/NWS  #   Vital Signs:  Patient profile:   65 year old female Height:      68 inches Weight:      166 pounds BMI:     25.33 Temp:     98.4 degrees F oral Pulse rate:   84 / minute Pulse rhythm:   regular Resp:     16 per minute BP sitting:   130 / 70  (left arm) Cuff size:   regular  Vitals Entered By: Lanier Prude, CMA(AAMA) (June 20, 2010 3:31 PM) CC: MWV Is Patient Diabetic? No Comments pt is not taking Lovaza or Nascobal   Primary Care Provider:  Tresa Garter MD  CC:  MWV.  History of Present Illness: The patient presents for a preventive health examination  C/o LBP  Current Medications (verified): 1)  Procardia Xl 30 Mg  Tb24 (Nifedipine) .... Take 1 By Mouth Once Daily 2)  Lopressor 50 Mg Tabs (Metoprolol Tartrate) .Marland Kitchen.. 1 By Mouth Three Times A Day 3)  Lasix 40 Mg Tabs (Furosemide) .Marland Kitchen.. 1 By Mouth  Two Times A Day 4)  Aspir-Low 81 Mg Tbec (Aspirin) .... Once Daily 5)  Ativan 0.5 Mg  Tabs (Lorazepam) .Marland Kitchen.. 1 By Mouth At Bedtime 6)  Levothroid 125 Mcg Tabs (Levothyroxine Sodium) .Marland Kitchen.. 1 Po Once Daily 7)  Doxepin Hcl 50 Mg  Caps (Doxepin Hcl) .... 3 By Mouth At Bedtime 8)  Depo-Estradiol 5 Mg/ml  Oil (Estradiol Cypionate) .... Marland Kitchen25 Mg Inj Q 18-25 Days 9)  Nitrolingual 0.4 Mg/spray Soln (Nitroglycerin) .... One Spray Under Tongue Every 5 Minutes As Needed For Chest Pain---May Repeat Times Three 10)  Tranxene-T 15 Mg  Tabs (Clorazepate Dipotassium) .Marland Kitchen.. 1 By Mouth Three Times A Day Prn 11)  Vitamin D3 1000 Unit Tabs (Cholecalciferol) .... Once Daily 12)  Klor-Con M20 20 Meq Cr-Tabs (Potassium Chloride Crys Cr) .Marland Kitchen.. 1 By Mouth Bid 13)  Lovaza 1 Gm Caps (Omega-3-Acid Ethyl Esters) .... 2 By Mouth Two Times A Day For Lipids 14)  Nascobal 500 Mcg/0.72ml Soln (Cyanocobalamin) .Marland Kitchen.. 1 Spr in One Nostril Q 1 Wk 15)  Triamcinolone Acetonide 0.5 % Oint (Triamcinolone Acetonide) .... Use Two Times A Day As Needed  Allergies (verified): 1)   ! Amoxicillin 2)  ! * Antihistamines 3)  ! * Bellergal 4)  ! Cipro 5)  ! Codeine 6)  ! Darvocet 7)  ! Epinephrine 8)  ! Imdur 9)  ! Lidocaine 10)  ! Morphine 11)  ! Tetracycline 12)  ! Benadryl 13)  ! 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 ANXIETY (ICD-300.00) Prolapsed bladder Dr Henderson Cloud Vaginal lichen 2011    Past Surgical History: Reviewed history from 03/12/2007 and no changes required. 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  The patient denies anorexia, fever, weight loss, weight gain, vision loss, decreased hearing, hoarseness, chest pain, syncope, dyspnea on exertion, peripheral edema, prolonged cough, headaches, hemoptysis, abdominal pain, melena, hematochezia, severe indigestion/heartburn, hematuria, incontinence, genital sores, muscle weakness, suspicious skin lesions, transient blindness, difficulty walking, unusual weight change, abnormal bleeding, enlarged lymph nodes, angioedema, and breast masses.          Stress  Physical Exam  General:  overweight white female who is emotionally stressed but in no medical distress Head:  Normocephalic and atraumatic without obvious abnormalities. No apparent alopecia or balding. Eyes:  No corneal or conjunctival inflammation noted. EOMI. Perrla. Funduscopic exam benign, without hemorrhages, exudates or papilledema. Vision grossly normal. Ears:  R ear normal and L ear normal.   Nose:  External nasal examination shows no deformity or inflammation. Nasal mucosa are pink and moist without lesions or exudates. Mouth:  Oral mucosa and oropharynx without lesions or exudates.  Teeth in good repair. Neck:  No deformities, masses, or tenderness noted. Lungs:  Normal respiratory effort, chest expands symmetrically. Lungs are clear to auscultation, no crackles or wheezes. Heart:  normal rate and regular rhythm.   Abdomen:  Bowel sounds positive,abdomen soft and non-tender without masses, organomegaly or hernias noted. Msk:  back exam: nl stand, gait, toe/heel walk, step-up to exam table, SLR sitting, DTR's at patella and achilles tendon. No CVAT. Nl sensation to light touch and pin-prick Extremities:  No clubbing, cyanosis, edema, or deformity noted with normal full range of motion of all joints.   Neurologic:  alert & oriented X3, cranial nerves II-XII intact, strength normal in all extremities, sensation intact to light touch, sensation intact to pinprick, gait normal, and DTRs symmetrical and normal.   Skin:  turgor normal and color normal.   Cervical Nodes:  no anterior cervical adenopathy and no posterior cervical adenopathy.   Psych:  Oriented X3, normally interactive, good eye contact, and moderately anxious.  not suicidal.     Impression & Recommendations:  Problem # 1:  WELL ADULT EXAM (ICD-V70.0) Assessment New The labs were reviewed with the patient.  Health and age related issues were discussed. Available screening tests and vaccinations were discussed as  well. Healthy life style including good diet and exercise was discussed.  GYN/PAP and mammo q 12 months   Problem # 2:  GAIT DISTURBANCE (ICD-781.2) SI joint pain Assessment: Unchanged  Problem # 3:  BACK PAIN, LUMBAR (ICD-724.2) Assessment: Deteriorated See "Patient Instructions".  Her updated medication list for this problem includes:    Aspir-low 81 Mg Tbec (Aspirin) ..... Once daily    Ibuprofen 400 Mg Tabs (Ibuprofen) .Marland Kitchen... 1 by mouth two times a day pc    Tramadol Hcl 50 Mg Tabs (Tramadol hcl) .Marland Kitchen... 1-2 tabs by mouth two times a day as needed pain  Problem # 4:  HYPERLIPIDEMIA (ICD-272.4) Assessment: Unchanged  The following medications were removed from the medication list:    Lovaza 1 Gm Caps (Omega-3-acid ethyl esters) .Marland Kitchen... 2 by mouth two times a day for lipids  Labs Reviewed: SGOT: 24 (06/14/2010)   SGPT: 16 (06/14/2010)  Prior 10 Yr Risk Heart Disease: N/A (11/03/2009)   HDL:54.70 (06/14/2010), 51.90 (12/20/2009)  LDL:76 (03/21/2009), 80 (09/21/2008)  Chol:264 (06/14/2010), 266 (12/20/2009)  Trig:254.0 (06/14/2010), 184.0 (12/20/2009)  Complete Medication List: 1)  Procardia Xl 30 Mg Tb24 (Nifedipine) .... Take 1 by mouth once daily 2)  Lopressor  50 Mg Tabs (Metoprolol tartrate) .Marland Kitchen.. 1 by mouth three times a day 3)  Lasix 40 Mg Tabs (Furosemide) .Marland Kitchen.. 1 by mouth  two times a day 4)  Aspir-low 81 Mg Tbec (Aspirin) .... Once daily 5)  Ativan 0.5 Mg Tabs (Lorazepam) .Marland Kitchen.. 1 by mouth at bedtime 6)  Levothroid 125 Mcg Tabs (Levothyroxine sodium) .Marland Kitchen.. 1 po once daily 7)  Doxepin Hcl 50 Mg Caps (Doxepin hcl) .... 3 by mouth at bedtime 8)  Depo-estradiol 5 Mg/ml Oil (Estradiol cypionate) .... Marland Kitchen25 mg inj q 18-25 days 9)  Nitrolingual 0.4 Mg/spray Soln (Nitroglycerin) .... One spray under tongue every 5 minutes as needed for chest pain---may repeat times three 10)  Tranxene-t 15 Mg Tabs (Clorazepate dipotassium) .Marland Kitchen.. 1 by mouth three times a day prn 11)  Vitamin D3 1000 Unit Tabs  (Cholecalciferol) .... Once daily 12)  Klor-con M20 20 Meq Cr-tabs (Potassium chloride crys cr) .Marland Kitchen.. 1 by mouth bid 13)  Triamcinolone Acetonide 0.5 % Oint (Triamcinolone acetonide) .... Use two times a day as needed 14)  Ibuprofen 400 Mg Tabs (Ibuprofen) .Marland Kitchen.. 1 by mouth two times a day pc 15)  Tramadol Hcl 50 Mg Tabs (Tramadol hcl) .Marland Kitchen.. 1-2 tabs by mouth two times a day as needed pain  Patient Instructions: 1)  Use stretching and balance exercises that I have provided (15 min. or longer every day)  2)  Please schedule a follow-up appointment in 3 months. Prescriptions: KLOR-CON M20 20 MEQ CR-TABS (POTASSIUM CHLORIDE CRYS CR) 1 by mouth bid  #180 x 3   Entered and Authorized by:   Tresa Garter MD   Signed by:   Tresa Garter MD on 06/20/2010   Method used:   Print then Give to Patient   RxID:   [id number] TRANXENE-T 15 MG  TABS (CLORAZEPATE DIPOTASSIUM) 1 by mouth three times a day prn  #270 x 1   Entered and Authorized by:   Tresa Garter MD   Signed by:   Tresa Garter MD on 06/20/2010   Method used:   Print then Give to Patient   RxID:   [id number] NITROLINGUAL 0.4 MG/SPRAY SOLN (NITROGLYCERIN) One spray under tongue every 5 minutes as needed for chest pain---may repeat times three  #1 x 3   Entered and Authorized by:   Tresa Garter MD   Signed by:   Tresa Garter MD on 06/20/2010   Method used:   Print then Give to Patient   RxID:   [id number] DOXEPIN HCL 50 MG  CAPS (DOXEPIN HCL) 3 by mouth at bedtime  #90 x 3   Entered and Authorized by:   Tresa Garter MD   Signed by:   Tresa Garter MD on 06/20/2010   Method used:   Print then Give to Patient   RxID:   [id number] LEVOTHROID 125 MCG TABS (LEVOTHYROXINE SODIUM) 1 po once daily  #90 x 3   Entered and Authorized by:   Tresa Garter MD   Signed by:   Tresa Garter MD on 06/20/2010   Method used:   Print then Give to Patient   RxID:    [id number] ATIVAN 0.5 MG  TABS (LORAZEPAM) 1 by mouth at bedtime  #90 x 1   Entered and Authorized by:   Tresa Garter MD   Signed by:   Tresa Garter MD on 06/20/2010   Method used:   Print then Give to Patient   RxID:  [id number] LASIX 40 MG TABS (FUROSEMIDE) 1 by mouth  two times a day  #60 Each x 12   Entered and Authorized by:   Tresa Garter MD   Signed by:   Tresa Garter MD on 06/20/2010   Method used:   Print then Give to Patient   RxID:   [id number] LOPRESSOR 50 MG TABS (METOPROLOL TARTRATE) 1 by mouth three times a day  #90 Each x 12   Entered and Authorized by:   Tresa Garter MD   Signed by:   Tresa Garter MD on 06/20/2010   Method used:   Print then Give to Patient   RxID:   [id number] PROCARDIA XL 30 MG  TB24 (NIFEDIPINE) Take 1 by mouth once daily  #90 x 3   Entered and Authorized by:   Tresa Garter MD   Signed by:   Tresa Garter MD on 06/20/2010   Method used:   Print then Give to Patient   RxID:   [id number] TRAMADOL HCL 50 MG TABS (TRAMADOL HCL) 1-2 tabs by mouth two times a day as needed pain  #120 x 3   Entered and Authorized by:   Tresa Garter MD   Signed by:   Tresa Garter MD on 06/20/2010   Method used:   Print then Give to Patient   RxID:   [id number] IBUPROFEN 400 MG TABS (IBUPROFEN) 1 by mouth two times a day pc  #60 x 3   Entered and Authorized by:   Tresa Garter MD   Signed by:   Tresa Garter MD on 06/20/2010   Method used:   Print then Give to Patient   RxID:   [id number]    Orders Added: 1)  Est. Patient 65& > [57322]

## 2010-08-09 LAB — BASIC METABOLIC PANEL
BUN: 11 mg/dL (ref 6–23)
BUN: 12 mg/dL (ref 6–23)
CO2: 24 mEq/L (ref 19–32)
CO2: 27 mEq/L (ref 19–32)
Calcium: 8.6 mg/dL (ref 8.4–10.5)
Calcium: 9.2 mg/dL (ref 8.4–10.5)
Chloride: 106 mEq/L (ref 96–112)
Chloride: 109 mEq/L (ref 96–112)
Creatinine, Ser: 0.97 mg/dL (ref 0.4–1.2)
Creatinine, Ser: 0.98 mg/dL (ref 0.4–1.2)
GFR calc Af Amer: >60 mL/min (ref >60)
GFR calc Af Amer: >60 mL/min (ref >60)
GFR calc non Af Amer: 57 mL/min — ABNORMAL LOW (ref >60)
GFR calc non Af Amer: 57 mL/min — ABNORMAL LOW (ref >60)
Glucose, Bld: 105 mg/dL — ABNORMAL HIGH (ref 70–99)
Glucose, Bld: 87 mg/dL (ref 70–99)
Potassium: 3.6 mEq/L (ref 3.5–5.1)
Potassium: 3.8 mEq/L (ref 3.5–5.1)
Sodium: 140 mEq/L (ref 135–145)
Sodium: 140 mEq/L (ref 135–145)

## 2010-08-09 LAB — CBC
HCT: 34.2 % — ABNORMAL LOW (ref 36.0–46.0)
HCT: 36.8 % (ref 36.0–46.0)
Hemoglobin: 11.6 g/dL — ABNORMAL LOW (ref 12.0–15.0)
Hemoglobin: 12.5 g/dL (ref 12.0–15.0)
MCHC: 33.9 g/dL (ref 30.0–36.0)
MCHC: 34 g/dL (ref 30.0–36.0)
MCV: 94.7 fL (ref 78.0–100.0)
MCV: 94.8 fL (ref 78.0–100.0)
Platelets: 194 10*3/uL (ref 150–400)
Platelets: 206 10*3/uL (ref 150–400)
RBC: 3.6 MIL/uL — ABNORMAL LOW (ref 3.87–5.11)
RBC: 3.89 MIL/uL (ref 3.87–5.11)
RDW: 12.1 % (ref 11.5–15.5)
RDW: 12.4 % (ref 11.5–15.5)
WBC: 5 10*3/uL (ref 4.0–10.5)
WBC: 6.5 10*3/uL (ref 4.0–10.5)

## 2010-08-09 LAB — D-DIMER, QUANTITATIVE: D-Dimer, Quant: <0.22 ug/mL-FEU (ref 0.00–0.48)

## 2010-08-09 LAB — PROTIME-INR
INR: 1.09 (ref 0.00–1.49)
Prothrombin Time: 14 seconds (ref 11.6–15.2)

## 2010-08-18 ENCOUNTER — Other Ambulatory Visit: Payer: Medicare Other

## 2010-08-18 ENCOUNTER — Other Ambulatory Visit: Payer: Self-pay | Admitting: Internal Medicine

## 2010-08-18 DIAGNOSIS — E7800 Pure hypercholesterolemia, unspecified: Secondary | ICD-10-CM

## 2010-08-18 DIAGNOSIS — I2510 Atherosclerotic heart disease of native coronary artery without angina pectoris: Secondary | ICD-10-CM

## 2010-09-20 ENCOUNTER — Ambulatory Visit: Payer: Self-pay | Admitting: Internal Medicine

## 2010-09-26 ENCOUNTER — Ambulatory Visit: Payer: Self-pay | Admitting: Internal Medicine

## 2010-10-03 NOTE — Assessment & Plan Note (Signed)
[name]                            CARDIOLOGY OFFICE NOTE   [name], [name]                         MRN:          [id number]  DATE:07/29/2007                            DOB:          06/28/1945    HISTORY:  [name] returns for followup at the [facility]  office on July 29, 2007.  [name] is a delightful 65 year old woman  with coronary artery disease status post multiple PCI procedures of the  LAD.  Her last intervention was back in 2001 and she has really done  well with medical therapy since that time.  She quit smoking several  years ago and attributes much of her symptomatic improvement to tobacco  cessation.   [name] has been active in the past but has not exercised routinely  over the last several months.  She is disappointed that she has gained  some weight since her last office visit and she admits to frequent  snacks and sweets recently.  From a symptomatic standpoint she is having  no problems.  She denies chest pain, dyspnea, orthopnea, PND or edema.  She is tolerating her medications well and has no complaints today.   MEDICATIONS:  1. Include Procardia XL 30 mg daily.  2. Lopressor 50 mg three daily.  3. Lasix 40 mg twice daily.  4. Aspirin 81 mg two daily.  5. Ativan 0.5 mg at bedtime.  6. Levothroid 125 mcg Monday through Friday and half that dose on      Saturday and Sunday.  7. Doxepin 50 mg t.i.d.  8. K-Dur 20 mEq 1-2 daily.  9. Simvastatin 80 mg at bedtime.  10.Depo-Estradiol injections.   ALLERGIES:  Are multiple and include DARVOCET, TETRACYCLINE, AMOXIL,  CIPRO, IMDUR, MORPHINE amongst others.  Many of these are drug  intolerances.   PHYSICAL EXAMINATION:  GENERAL:  On exam she is alert and oriented.  In  no acute distress.  VITAL SIGNS:  Weight 165, blood pressure 120/76, heart rate 64,  respiratory rate 16.  HEENT:  Normal.  NECK:  Normal carotid upstrokes without bruits.  Jugular venous  pressure  is normal.  LUNGS:  Lungs are clear bilaterally.  HEART:  Regular rate and rhythm without murmurs or gallops.  ABDOMEN:  Abdomen is soft, nontender.  No organomegaly.  No abdominal  bruits.  EXTREMITIES:  No clubbing, cyanosis or edema.  Peripheral pulses are 2+  and equal throughout.  SKIN:  Warm and dry without rash.   EKG shows sinus rhythm with a premature atrial complex, otherwise within  normal limits.   Lipids from July 23, 2007, showed total cholesterol of 148 with an HDL  of 45 and an LDL of 68, triglycerides were 176.  LFTs were normal.   ASSESSMENT:  1. CAD (coronary artery disease) status post LAD (left anterior      descending) angioplasty.  The patient remains asymptomatic.      Continue current therapy that includes aspirin, simvastatin and      metoprolol.  No evidence of angina.  Encouraged regular exercise  and reduction in ice cream and other sweets with a goal of weight      loss.  [name] has gained 17 pounds over the last 3 years in      reviewing our office notes.  2. Dyslipidemia.  LDL is at goal and HDL was improved from previous.      Hopefully dietary modifications will improve triglycerides.  Also      suggested fish oil.  Continue with yearly followup.   FOLLOWUP:  For followup I would like to see [name] back on a yearly  basis.  She is regularly followed by Dr. Posey Rea.     [name]. [name] Seltzer, MD  Electronically Signed    MDC/MedQ  DD: 07/29/2007  DT: 07/30/2007  Job #: [id number]   cc:   Georgina Quint. Plotnikov, MD

## 2010-10-03 NOTE — Assessment & Plan Note (Signed)
[facility]                            CARDIOLOGY OFFICE NOTE   NAME:[name]                         MRN:          [id number]  DATE:01/23/2007                            DOB:          [birth year]    PRIMARY CARE PHYSICIAN:  Georgina Quint. Plotnikov, M.D.   [name] was seen in followup at the [facility] office on  January 23, 2007.  She is a long-time patient of Dr. Glennon Hamilton.  I  will be assuming her care from here forward.   [name] has coronary artery disease and has undergone multiple  percutaneous interventions of the LAD.  She underwent multiple  angioplasty procedures but has not had any difficulty since her last  intervention in 2001.  She has done well with medical therapy and has no  limitations at this point.  She has gotten out of the routine of her  exercise program and is motivated to start back.  She denies any chest  pain, dyspnea, orthopnea, PND, edema, lightheadedness, syncope, or  palpitations.   CURRENT MEDICATIONS:  1. PCA XL 30 mg daily.  2. Lopressor 50 mg 3 times daily.  3. Lasix 40 mg twice daily.  4. Aspirin 81 mg 2 daily.  5. Ativan 0.5 mg at bedtime.  6. Levothroid 0.125 mg Monday through Friday and 0.0625 mg Saturday      and Sunday.  7. Doxepin 50 mg 3 times daily.  8. K-Dur 20 mEq 1-2 daily.  9. Simvastatin 80 mg at bedtime.  10.Depo-Estradiol.   PHYSICAL EXAMINATION:  She is alert and oriented in no acute distress.  Weight is 162 pounds.  Blood pressure is 140/84, heart rate 71.  HEENT:  Normal.  NECK:  Normal carotid upstrokes with soft bilateral carotid bruits.  Jugular venous pressure is normal.  LUNGS:  Clear to auscultation bilaterally.  HEART:  Regular rate and rhythm without murmurs or gallops.  ABDOMEN:  Soft and nontender.  No organomegaly.  EXTREMITIES:  No clubbing, cyanosis or edema.  Peripheral pulses are 2+  and equal throughout.   EKG demonstrates a normal sinus rhythm and is  within normal limits.   ASSESSMENT:  1. Coronary artery disease.  Patient is stable.  She has no symptoms      of angina.  Continue current therapy with aspirin, Lopressor, and      statin therapy.  2. Dyslipidemia:  Most recent lipids from September 2nd show a      cholesterol of 141, triglycerides 142, HDL 39, LDL 73.  This was      after an increase from simvastatin 40 to 80 mg.  Will repeat lipids      and LFTs in six months.  3. Carotid bruits:  Will check carotid ultrasound.  She had a study      performed in 2000 that showed minimal plaque.   For followup, I would like to see [name] back in six months.  I  encouraged her to increase her exercise regimen.     [name]. Excell Seltzer, MD  Electronically Signed  MDC/MedQ  DD: 01/23/2007  DT: 01/23/2007  Job #: [id number]   cc:   Georgina Quint. Plotnikov, MD

## 2010-10-06 NOTE — Assessment & Plan Note (Signed)
[facility]                             PRIMARY CARE OFFICE NOTE   NAME:Tyer, JAMAL HASKIN                         MRN:          [id number]  DATE:02/27/2006                            DOB:          1945/10/19    PROCEDURE:  Left abductor pollicis longus injection.   INDICATIONS FOR PROCEDURE:  Recurrent tendinitis.   DESCRIPTION OF PROCEDURE:  Risks including incomplete procedure, bleeding,  infection, as well as benefits, were explained to the patient in detail, she  agreed to proceed.   The area was prepped with Betadine and alcohol, the tendon was injected in  the usual fashion with 10 mg of Depo-Medrol and 0.5 mL of 2% lidocaine. She  had some worsening pain immediately following the procedure which resolved  in a few minutes. Tolerated it well, complications none.            ______________________________  Georgina Quint. Plotnikov, MD      AVP/MedQ  DD:  02/27/2006  DT:  03/01/2006  Job #:  [id number]

## 2010-10-06 NOTE — Discharge Summary (Signed)
[place]. [facility]  Patient:    [name], [name]                           MRN: [id number] Adm. Date:  [id number] Disc. Date: [id number] Attending:  Nelta Numbers Dictator:   Joellyn Rued, P.A.C. CC:         Sonda Primes, M.D. [facility]   Referring Physician Discharge [place]  SUMMARY OF HISTORY:  [name] is a 65 year old white female with a history of coronary artery disease and angioplasty to the LAD in 1993, 1995, and 1996 - however, records are pending.  She states she has been doing well until the preceding five days when she has had reoccurring chest pressure, jaw discomfort associated with shortness of breath and diaphoresis, relieved with one to two sublingual nitroglycerin.  The discomfort would also occur with rest and would occur several times a day.  Seems to be worse with exertion. Her history is also notable for hypothyroidism, hypertension, anxiety, cystocele, carotid bruit - the Dopplers were okay in March 2000, sinusitis, deviated septum.  LABORATORY DATA:  CKs and troponins were negative for myocardial infarction. H&H was 10.8 and 31.8, normal indices, platelets 199, wbcs 5.6.  Hemoglobin A1c is pending.  PT 13.3, PTT 28.  Sodium 139, potassium 3.8, BUN 15, creatinine 1.1, glucose slightly elevated at 117.  TSH was 1.607.  EKG showed normal sinus rhythm, nonspecific ST-T wave changes.  HOSPITAL COURSE:  Ms. List was placed on IV heparin and admitted to 5100. Overnight, she did not have any further chest discomfort.  Enzymes and EKGs were negative for myocardial infarction.  Cardiac catheterization was performed on March 20 by Dr. Gerri Spore.  According to his progress note, she had a 20% proximal LAD, 20% proximal LAD, 30% diagonal; distal diagonal appeared to be chronically occluded.  Ejection fraction was 66% with trace MR. Aortic pressure 174/75, LV 174/16.  Post sheath removal and bedrest she was ambulating without difficulty.   Catheterization site was intact.  On the morning of March 21, Dr. Tenny Craw reviewed her chart and felt that the patient could be discharged to home with a trial basis of Protonix for GERD.  DISCHARGE DIAGNOSES: 1. Chest discomfort of unknown etiology. 2. History as previously.  DISPOSITION:  She is discharged home.  MEDICATIONS:  She was given a new prescription for Protonix 40 mg b.i.d. for three weeks then decrease to once a day.  She was asked to continue her home medications, which include:  1. Lopressor 50 t.i.d.  2. Levothroid 0.125 Monday and Friday, one-half tablet other days.  3. Lasix 40 q.d. and as needed.  4. Tranxene 7.5 q.d. and as needed.  5. Coated aspirin 325 q.d.  6. Doxepin 50 mg three tablets at bedtime.  7. Ativan 0.5 mg at bedtime.  8. Depo-Estradiol as previously.  9. Procardia XL daily. 10. Pravachol 40 q.h.s. 11. K-Dur 20 mEq q.d. 12. Sublingual nitroglycerin as needed.  ACTIVITY:  She was advised no lifting, driving, sexual activity, or heavy exertion for two days.  Given permission to return to work on Monday.  DIET:  Maintain low salt/fat/choesterol diet.  WOUND CARE:  If she had any problems with her catheterization site, she was asked to call immediately.  FOLLOW-UP:  She will keep her appointment with Dr. Posey Rea tomorrow and she will see Dr. Corinda Gubler on Tuesday, April 23 at 11:15 a.m. DD:  08/08/00 TD:  08/08/00 Job: [id number]

## 2010-10-06 NOTE — Discharge Summary (Signed)
[name], [name]                  ACCOUNT NO.:  [id number]   MEDICAL RECORD NO.:  [id number]          PATIENT TYPE:  INP   LOCATION:  2037                         FACILITY:  [facility]   PHYSICIAN:  Cecil Cranker, M.D.DATE OF BIRTH:  1945-12-31   DATE OF ADMISSION:  07/27/2004  DATE OF DISCHARGE:  07/29/2004                                 DISCHARGE SUMMARY   PROCEDURE:  GXT Cardiolite.   DISCHARGE DIAGNOSES:  1.  Chest pain, enzymes negative for myocardial infarction and Cardiolite      negative for ischemia with an ejection fraction of 56%.  2.  Hyperlipidemia, increased Pravachol 80 from every other day to q.d.  3.  Hypertension.  4.  Status post percutaneous intervention to the left anterior descending in      1993, 1995, and 1996.  5.  Status post catheterization in 2002 for chest pain. Dis-similar to the      current symptoms that was associated with no high grade coronary artery      disease.  6.  Multiple medication allergies including AMOXICILLIN, ANTIHISTAMINES,      BELLERGAL, BENADRYL, CIPROFLOXACIN, CODEINE, CRESTOR, DARVOCET, DARVON,      EPINEPHRINE, IMDUR, LIDOCAINE, LIPITOR, MORPHINE AND TETRACYCLINE.  7.  Status post hysterectomy, nasal surgery and oral surgery.  8.  Possible history of gastroesophageal reflux disease.   HOSPITAL COURSE:  [name] is a 65 year old female with known coronary  artery disease. She had 2 significant episodes of chest pain, for which she  took nitroglycerin. She saw Dr. Glennon Hamilton in the office and he felt that  admission and evaluation was indicated. She came into the hospital on July 27, 2004. Her cardiac enzymes were negative for myocardial infarction. She  had some recurrent discomfort in the back of her neck, her shoulders, and  jaw and he felt that an inpatient Cardiolite was indicated. This was  performed on July 29, 2004. The Cardiolite showed no scar or ischemia and  an ejection fraction of 56%. She had a D-dimer  performed that was negative  for pulmonary embolism. She had a lipid profile performed that showed a  total cholesterol of 247, triglycerides 109, HDL 79, LDL 146. [name]  stated that she had been taking her Pravachol every other day. She is  advised to increase it to q.d. A TSH was checked and was within normal  limits at 2.546. On July 29, 2004 her Cardiolite was negative for ischemia  and her cardiac enzymes were negative for myocardial infarction. She had no  further episodes of chest pain. She was considered stable for discharge and  is to followup in the office as an outpatient.   ACTIVITY:  Her activity level is to be as tolerated.   DIET:  She is to stick to a low-fat diet.   FOLLOW UP:  She is to followup with Dr. Posey Rea as needed and with Dr. Glennon Hamilton. The office will call.   DISCHARGE MEDICATIONS:  1.  Pravachol 80 mg q.d.  2.  K-Dur 20 meq b.i.d.  3.  Procardia and  Imdur are on HOLD.  4.  Lopressor 50 mg b.i.d.  5.  Synthroid 0.125 mg 1 tablet on Monday and Friday and 1/2 tablet other      days.  6.  Lasix 40 mg b.i.d.  7.  Transene 7.5 mg q. a.m. and p.r.n.  8.  Doxepin 50 mg 3 tabs q.h.s.  9.  Ativan p.r.n. q.h.s.  10. Nitroglycerin p.r.n.  11. Senokot 3 tabs q.h.s.      RB/MEDQ  D:  07/29/2004  T:  07/31/2004  Job:  [id number]   cc:   Georgina Quint. Plotnikov, M.D. [facility]   Bea Laura Graceann Congress, M.D.

## 2010-10-06 NOTE — Cardiovascular Report (Signed)
[place]. [facility]  Patient:    [name], [name]                           MRN: [id number] Proc. Date: 08/07/00 Adm. Date:  [id number] Attending:  Nelta Numbers CC:         Sonda Primes, M.D. [facility]  Bea Laura Graceann Congress, M.D. [facility]  Cardiac Catheterization Laboratory   Cardiac Catheterization  PROCEDURES PERFORMED:  Left heart catheterization with coronary angiography and left ventriculography.  INDICATIONS:  Ms. Hino is a 65 year old woman with a history of prior angioplasty to the left anterior descending artery.  She was admitted for recurrent chest pain and referred for cardiac catheterization.  DESCRIPTION OF PROCEDURE:  A 6 French sheath was placed in the right femoral artery.  Standard Judkins 6 French catheters were utilized.  Contrast was Omnipaque.  There were no complications.  RESULTS:  HEMODYNAMICS:  Left ventricular pressure 174/16.  Aortic pressure 174/75. There was no aortic valve gradient.  LEFT VENTRICULOGRAM:  Wall motion is normal.  Ejection fraction calculated at 66%.  Trace mitral regurgitation.  CORONARY ARTERIOGRAPHY:  (Right dominant).  Left main:  Normal.  Left anterior descending:  The left anterior descending artery has a 20% stenosis at its ostium and a 20% stenosis in the proximal vessel.  The LAD gives rise to a large first diagonal which has a 30% stenosis.  There is a small lateral branch to this diagonal which tapers rather abruptly, possibly representing a chronic occlusion.  However, the vessel would be very small beyond this.  This was actually present on the previous catheterization.  Left circumflex:  The left circumflex gives rise to a single branching large obtuse marginal.  The circumflex artery is free of angiographic disease.  Right coronary artery:  The right coronary artery gives rise to a large acute marginal branch supplying a portion of the inferior septum.  There is a normal sized  posterior descending artery, a small posterolateral branch.  The right coronary artery is free of angiographic disease.  IMPRESSION: 1. Normal left ventricular systolic function. 2. Insignificant coronary artery disease with the exception of the tapering    of the branch in the first diagonal as described which was seen on    previous catheterization.  The patients chest pain appears to be noncardiac in etiology. DD:  08/07/00 TD:  08/08/00 Job: [id number]

## 2010-10-06 NOTE — Assessment & Plan Note (Signed)
[facility]                           PRIMARY CARE OFFICE NOTE   NAME:Raulerson, MERLY HINKSON                         MRN:          [id number]  DATE:07/12/2006                            DOB:          June 18, 1945    PROCEDURE:  Left abductor pollicis longus injection.   INDICATIONS FOR PROCEDURE:  Recurrent tendonitis.   DESCRIPTION OF PROCEDURE:  The risks including incomplete procedure,  bleeding, infection as well as benefits were explained to the patient in  detail, she agreed to proceed. The area was prepped with dye and  alcohol, the tendon was injected in the usual fashion with 10 mg of Depo-  Medrol and 0.5 mL of 2% lidocaine. Excellent pain relief immediately  following the procedure. Tolerated it well, complications none. Ace wrap  applied.     Georgina Quint. Plotnikov, MD  Electronically Signed    AVP/MedQ  DD: 07/12/2006  DT: 07/12/2006  Job #: [id number]

## 2010-10-06 NOTE — Assessment & Plan Note (Signed)
[facility]                              CARDIOLOGY OFFICE NOTE   NAME:[name]                         MRN:          [id number]  DATE:01/29/2006                            DOB:          1946-01-27    [name] is  a very pleasant 65 year old white female with a long history  of coronary artery disease dating back to 36.  She has had multiple  percutaneous interventions of the left anterior descending.  The most recent  catheterization in February 2002, revealed no high grade coronary disease.  The patient was admitted on July 27, 2004.  She had negative enzymes at that  time and had negative stress Myoview.  She was changed from Pravachol to  Vytorin 10/40 and had significant improvement in her lipids.  However,  having lost her job at [employer] and having no insurance she could not take  Vytorin after August 31, 2005.  Ten days later her LDL was up to 134 with  total cholesterol 203, ACL of 50.  The patient has had two episodes of chest  discomfort over the past several months, most recently one month ago. She  has been grieving the loss of her close friend with pancreatic cancer  recently.   CURRENT MEDICATIONS:  1. Procardia 30.  2. Vytorin which she has not taken since April.  3. K-Dur 20.  4. Lopressor 50 3x daily.  5. Levothroid.  6. Lasix 40.  7. Tranxene.  8. Doxepin.  9. Ativan.  10.Nitrostat.  11.Nitroglycerin spray.  12.Depo.  13.Estradiol.  14.Flonase.  15.Ecotrin 81.  16.Protonix 40.  17.Imdur 15.   Her history is outlined per her computer sheet which she brought in today.   PHYSICAL EXAMINATION:  Blood pressure 154/88, pulse 66.  Normal sinus  rhythm.  GENERAL:  General appearance is normal.  NECK:  JVP is not elevated. Carotid pulses are palpable without bruits.  LUNGS:  Clear.  CARDIAC:  Unremarkable.  ABDOMEN:  Normal.  EXTREMITIES:  Normal pulses palpable bilaterally.   IMPRESSION:  1. Coronary artery  disease with prior interventions-stable.  2. Hyperlipidemia off therapy for the past 5 months.  3. Hypertension, poorly controlled.   I have suggested the patient take Vytorin 10/40.  We will check her lipids  now and in 2 months.  Also she will need additional antihypertensive  therapy.   I will see her back in 3 or 4 months or p.r.n.  Her EKG today reveals normal  sinus rhythm with short PR interval, otherwise normal.                              E. Graceann Congress, MD, [facility]    EJL/MedQ  DD:  01/29/2006  DT:  01/30/2006  Job #:  [id number]

## 2010-10-06 NOTE — Assessment & Plan Note (Signed)
[facility]                            CARDIOLOGY OFFICE NOTE   NAME:[name] [name] CURVIN                         MRN:          [id number]  DATE:09/17/2006                            DOB:          11-Jun-1945    [name] [name] is a very pleasant 65 year old white female with a long  history of coronary artery disease dating back to 4.  She has had  multiple percutaneous interventions of the left anterior descending.   The most recent catheterization February 2002 revealed no high grade  coronary disease.   The patient was admitted on July 27, 2004 with chest pain.  She had  negative enzymes at that time and had negative stress test.   She was changed from Pravachol to Vytorin 10/40 and had significant  improvement in her lipids.   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  However, she now has a job with Doctor, general practice.  She also has  had stress since her good friend died approximately a year ago.   She has had no cardiac symptoms.   MEDICATIONS:  1. Procardia XL 30.  2. Lopressor 150.  3. Lasix 40 b.i.d.  4. Aspirin 81.  5. Doxepin 50 t.i.d.  6. Ativan 0.5 h.s.  7. Aspirin 81 to 162 daily.   PHYSICAL EXAMINATION:  VITAL SIGNS:  Blood pressure 130/82, pulse 59,  normal sinus rhythm.  GENERAL APPEARANCE:  Normal.  NECK:  JVP is not elevated.  Carotid pulses palpable and equal without  bruits.  LUNGS:  Clear.  CARDIAC:  Reveals no murmur or gallop.  ABDOMEN:  Normal.  EXTREMITIES:  Normal.   EKG is within normal limits.  She does have sinus bradycardia.   Her cholesterol is nicely controlled with Vytorin 10/40.  I suggested  she might try simvastatin 80 instead of Vytorin to see if this gives her  as good control of cholesterol.   She will try this.  We will recheck her lipids and LFTs in two months.  I suggest that she follow up with Dr. Excell Seltzer in 4-6 months.     [name] Cranker, MD, [facility]  Electronically Signed    EJL/MedQ  DD: 09/17/2006  DT: 09/18/2006  Job #: [id number]

## 2010-10-30 ENCOUNTER — Ambulatory Visit (INDEPENDENT_AMBULATORY_CARE_PROVIDER_SITE_OTHER): Payer: Medicare Other | Admitting: Internal Medicine

## 2010-10-30 ENCOUNTER — Encounter: Payer: Self-pay | Admitting: Internal Medicine

## 2010-10-30 DIAGNOSIS — F411 Generalized anxiety disorder: Secondary | ICD-10-CM

## 2010-10-30 DIAGNOSIS — R269 Unspecified abnormalities of gait and mobility: Secondary | ICD-10-CM

## 2010-10-30 DIAGNOSIS — I10 Essential (primary) hypertension: Secondary | ICD-10-CM

## 2010-10-30 DIAGNOSIS — F3289 Other specified depressive episodes: Secondary | ICD-10-CM

## 2010-10-30 DIAGNOSIS — E785 Hyperlipidemia, unspecified: Secondary | ICD-10-CM

## 2010-10-30 DIAGNOSIS — F329 Major depressive disorder, single episode, unspecified: Secondary | ICD-10-CM

## 2010-10-30 MED ORDER — CLORAZEPATE DIPOTASSIUM 15 MG PO TABS: 15.0000 mg | ORAL_TABLET | Freq: Three times a day (TID) | ORAL | Status: DC | PRN

## 2010-10-30 MED ORDER — LORAZEPAM 0.5 MG PO TABS: 0.5000 mg | ORAL_TABLET | Freq: Every day | ORAL | Status: DC

## 2010-10-30 NOTE — Assessment & Plan Note (Signed)
On Rx 

## 2010-10-30 NOTE — Assessment & Plan Note (Signed)
Refilled Rx. 

## 2010-10-30 NOTE — Progress Notes (Signed)
  Subjective:    Patient ID: [name], female    DOB: 10-May-1946, 65 y.o.   MRN: [id number]  HPI   The patient presents for a follow-up of  CAD, chronic hypertension, chronic dyslipidemia, OA with LBP controlled with medicines   Review of Systems  Constitutional: Negative for chills, activity change, appetite change, fatigue and unexpected weight change.  HENT: Negative for congestion, mouth sores and sinus pressure.   Eyes: Negative for visual disturbance.  Respiratory: Negative for cough and chest tightness.   Gastrointestinal: Negative for nausea and abdominal pain.  Genitourinary: Negative for frequency, difficulty urinating and vaginal pain.  Musculoskeletal: Positive for back pain and arthralgias (hip pains). Negative for gait problem.  Skin: Negative for pallor and rash.  Neurological: Negative for dizziness, tremors, weakness, numbness and headaches.  Psychiatric/Behavioral: Negative for confusion and sleep disturbance.       Objective:   Physical Exam  Constitutional: She appears well-developed and well-nourished. No distress.  HENT:  Head: Normocephalic.  Right Ear: External ear normal.  Left Ear: External ear normal.  Nose: Nose normal.  Mouth/Throat: Oropharynx is clear and moist.  Eyes: Conjunctivae are normal. Pupils are equal, round, and reactive to light. Right eye exhibits no discharge. Left eye exhibits no discharge.  Neck: Normal range of motion. Neck supple. No JVD present. No tracheal deviation present. No thyromegaly present.  Cardiovascular: Normal rate, regular rhythm and normal heart sounds.   Pulmonary/Chest: No stridor. No respiratory distress. She has no wheezes.  Abdominal: Soft. Bowel sounds are normal. She exhibits no distension and no mass. There is no tenderness. There is no rebound and no guarding.  Musculoskeletal: She exhibits no edema and no tenderness.  Lymphadenopathy:    She has no cervical adenopathy.  Neurological: She displays  normal reflexes. No cranial nerve deficit. She exhibits normal muscle tone. Coordination normal.  Skin: No rash noted. No erythema.  Psychiatric: She has a normal mood and affect. Her behavior is normal. Judgment and thought content normal.       Lab Results  Component Value Date   WBC 5.8 06/14/2010   HGB 12.3 06/14/2010   HCT 35.7* 06/14/2010   PLT 235.0 06/14/2010   CHOL 264* 06/14/2010   TRIG 254.0* 06/14/2010   HDL 54.70 06/14/2010   LDLDIRECT 183.3 06/14/2010   ALT 16 06/14/2010   AST 24 06/14/2010   NA 140 06/14/2010   K 4.2 06/14/2010   CL 105 06/14/2010   CREATININE 1.0 06/14/2010   BUN 16 06/14/2010   CO2 28 06/14/2010   TSH 0.90 06/14/2010   INR 1.09 08/22/2009      Assessment & Plan:

## 2010-10-30 NOTE — Assessment & Plan Note (Signed)
Doing fair 

## 2010-10-30 NOTE — Assessment & Plan Note (Signed)
Overall better 

## 2010-10-30 NOTE — Assessment & Plan Note (Signed)
Cont Rx 

## 2010-11-03 ENCOUNTER — Other Ambulatory Visit: Payer: Medicare Other

## 2010-11-03 ENCOUNTER — Other Ambulatory Visit: Payer: Self-pay | Admitting: Internal Medicine

## 2010-11-10 ENCOUNTER — Other Ambulatory Visit: Payer: Medicare Other

## 2010-11-10 ENCOUNTER — Other Ambulatory Visit: Payer: Self-pay | Admitting: Internal Medicine

## 2010-11-10 DIAGNOSIS — I2510 Atherosclerotic heart disease of native coronary artery without angina pectoris: Secondary | ICD-10-CM

## 2010-11-10 DIAGNOSIS — E7800 Pure hypercholesterolemia, unspecified: Secondary | ICD-10-CM

## 2010-11-28 ENCOUNTER — Other Ambulatory Visit (INDEPENDENT_AMBULATORY_CARE_PROVIDER_SITE_OTHER): Payer: Medicare Other

## 2010-11-28 ENCOUNTER — Encounter: Payer: Self-pay | Admitting: Internal Medicine

## 2010-11-28 ENCOUNTER — Ambulatory Visit (INDEPENDENT_AMBULATORY_CARE_PROVIDER_SITE_OTHER): Payer: Medicare Other | Admitting: Internal Medicine

## 2010-11-28 VITALS — BP 160/80 | HR 80 | Temp 98.3°F | Resp 16 | Wt 163.0 lb

## 2010-11-28 DIAGNOSIS — R0789 Other chest pain: Secondary | ICD-10-CM

## 2010-11-28 DIAGNOSIS — R071 Chest pain on breathing: Secondary | ICD-10-CM

## 2010-11-28 DIAGNOSIS — I10 Essential (primary) hypertension: Secondary | ICD-10-CM

## 2010-11-28 DIAGNOSIS — F411 Generalized anxiety disorder: Secondary | ICD-10-CM

## 2010-11-28 DIAGNOSIS — R1011 Right upper quadrant pain: Secondary | ICD-10-CM

## 2010-11-28 LAB — URINALYSIS
Bilirubin Urine: NEGATIVE
Hgb urine dipstick: NEGATIVE
Ketones, ur: NEGATIVE
Leukocytes, UA: NEGATIVE
Nitrite: NEGATIVE
Specific Gravity, Urine: <=1.005 (ref 1.000–1.030)
Total Protein, Urine: NEGATIVE
Urine Glucose: NEGATIVE
Urobilinogen, UA: 0.2 (ref 0.0–1.0)
pH: 6.5 (ref 5.0–8.0)

## 2010-11-28 MED ORDER — NAPROXEN-ESOMEPRAZOLE 500-20 MG PO TBEC: 1.0000 | DELAYED_RELEASE_TABLET | Freq: Two times a day (BID) | ORAL | Status: DC

## 2010-11-28 NOTE — Assessment & Plan Note (Signed)
Will get an [place] - see reports Vimovo bid pc Ice Use Tramadol prn UA

## 2010-11-28 NOTE — Assessment & Plan Note (Signed)
Elev BP due to stress today. Cont Rx

## 2010-11-28 NOTE — Assessment & Plan Note (Signed)
Worse today. Reassured.

## 2010-11-28 NOTE — Patient Instructions (Signed)
Call if not better or if worse  [email or web address]

## 2010-11-28 NOTE — Progress Notes (Signed)
  Subjective:    Patient ID: [name], female    DOB: 06/24/1945, 65 y.o.   MRN: [id number]  HPI  C/o R LBP over R posterior rib cage since Thur after she lifted a large water melon. It was worse on Sat. Worse with activity. Dull pain. She could not sleep for a few hrs last night due to pain. She is very worried and asking to be tested.  Review of Systems  Respiratory: Negative for cough, choking, wheezing and stridor. Chest tightness: R chest wall pain.   Cardiovascular: Negative for chest pain.  Musculoskeletal: Positive for back pain.       Objective:   Physical Exam  Constitutional: She appears well-developed and well-nourished. No distress.  HENT:  Head: Normocephalic.  Right Ear: External ear normal.  Left Ear: External ear normal.  Nose: Nose normal.  Mouth/Throat: Oropharynx is clear and moist.  Eyes: Conjunctivae are normal. Pupils are equal, round, and reactive to light. Right eye exhibits no discharge. Left eye exhibits no discharge.  Neck: Normal range of motion. Neck supple. No JVD present. No tracheal deviation present. No thyromegaly present.  Cardiovascular: Normal rate, regular rhythm and normal heart sounds.   Pulmonary/Chest: No stridor. No respiratory distress. She has no wheezes. She exhibits tenderness (tender over R posterolat chest wall, no rash, no crepitus).  Abdominal: Soft. Bowel sounds are normal. She exhibits no distension and no mass. There is no tenderness. There is no rebound and no guarding.  Musculoskeletal: She exhibits no edema and no tenderness.  Lymphadenopathy:    She has no cervical adenopathy.  Neurological: She displays normal reflexes. No cranial nerve deficit. She exhibits normal muscle tone. Coordination normal.  Skin: No rash noted. No erythema.  Psychiatric: She has a normal mood and affect. Her behavior is normal. Judgment and thought content normal.       Procedure Note :    Procedure :   Sonography examination   Indication:  R posterolateral chest pain, RUQ pain   Equipment used: Sonosite M-Turbo with HFL38x/13-6 MHz transducer linear probe. The images were stored in the unit and later transferred in storage.  The patient was placed in the L decubitus position.  This study revealed a grossly normal gall bladder, liver and R kidney. Grossly normal (poorly visualized due to intestinal air) aorta   Impression: No gross pathology in the RUQ   Procedure Note :    Procedure :   Sonography examination   Indication: R posterolateral chest pain   Equipment used: Sonosite M-Turbo with P21x/5-1 MHz transducer phased array probe. The images were stored in the unit and later transferred in storage.  The patient was placed in a sitting  position.  This study revealed no lung or rib pathology on the right.  Impression: No rib or lung  Pathology in the area of interest on the right      Assessment & Plan:

## 2010-11-29 ENCOUNTER — Ambulatory Visit: Payer: Medicare Other | Admitting: Internal Medicine

## 2010-11-29 ENCOUNTER — Telehealth: Payer: Self-pay | Admitting: Internal Medicine

## 2010-11-29 NOTE — Telephone Encounter (Signed)
Pt informed

## 2010-11-29 NOTE — Telephone Encounter (Signed)
[name], please, inform patient that UA is nl Thx

## 2010-12-04 ENCOUNTER — Other Ambulatory Visit: Payer: Self-pay | Admitting: Internal Medicine

## 2010-12-04 ENCOUNTER — Encounter: Payer: Self-pay | Admitting: Internal Medicine

## 2010-12-04 ENCOUNTER — Ambulatory Visit (INDEPENDENT_AMBULATORY_CARE_PROVIDER_SITE_OTHER): Payer: Medicare Other | Admitting: Internal Medicine

## 2010-12-04 ENCOUNTER — Other Ambulatory Visit (INDEPENDENT_AMBULATORY_CARE_PROVIDER_SITE_OTHER): Payer: Medicare Other

## 2010-12-04 VITALS — BP 150/90 | HR 87 | Temp 96.6°F

## 2010-12-04 DIAGNOSIS — I2510 Atherosclerotic heart disease of native coronary artery without angina pectoris: Secondary | ICD-10-CM

## 2010-12-04 DIAGNOSIS — R0789 Other chest pain: Secondary | ICD-10-CM

## 2010-12-04 DIAGNOSIS — R109 Unspecified abdominal pain: Secondary | ICD-10-CM

## 2010-12-04 DIAGNOSIS — E7800 Pure hypercholesterolemia, unspecified: Secondary | ICD-10-CM

## 2010-12-04 DIAGNOSIS — R071 Chest pain on breathing: Secondary | ICD-10-CM

## 2010-12-04 DIAGNOSIS — F411 Generalized anxiety disorder: Secondary | ICD-10-CM

## 2010-12-04 LAB — CBC WITH DIFFERENTIAL/PLATELET
Basophils Absolute: 0.1 10*3/uL (ref 0.0–0.1)
Basophils Relative: 0.8 % (ref 0.0–3.0)
Eosinophils Absolute: 0.2 10*3/uL (ref 0.0–0.7)
Eosinophils Relative: 2.5 % (ref 0.0–5.0)
HCT: 38.3 % (ref 36.0–46.0)
Hemoglobin: 13 g/dL (ref 12.0–15.0)
Lymphocytes Relative: 28.4 % (ref 12.0–46.0)
Lymphs Abs: 1.7 10*3/uL (ref 0.7–4.0)
MCHC: 33.9 g/dL (ref 30.0–36.0)
MCV: 94.5 fl (ref 78.0–100.0)
Monocytes Absolute: 0.3 10*3/uL (ref 0.1–1.0)
Monocytes Relative: 4.9 % (ref 3.0–12.0)
Neutro Abs: 3.9 10*3/uL (ref 1.4–7.7)
Neutrophils Relative %: 63.4 % (ref 43.0–77.0)
Platelets: 265 10*3/uL (ref 150.0–400.0)
RBC: 4.05 Mil/uL (ref 3.87–5.11)
RDW: 12.5 % (ref 11.5–14.6)
WBC: 6.2 10*3/uL (ref 4.5–10.5)

## 2010-12-04 LAB — HEPATIC FUNCTION PANEL
ALT: 23 U/L (ref 0–35)
AST: 27 U/L (ref 0–37)
Albumin: 4.3 g/dL (ref 3.5–5.2)
Alkaline Phosphatase: 91 U/L (ref 39–117)
Bilirubin, Direct: 0.1 mg/dL (ref 0.0–0.3)
Total Bilirubin: 0.5 mg/dL (ref 0.3–1.2)
Total Protein: 7.2 g/dL (ref 6.0–8.3)

## 2010-12-04 LAB — LIPID PANEL
Cholesterol: 292 mg/dL — ABNORMAL HIGH (ref 0–200)
HDL: 55.5 mg/dL (ref >39.00)
Total CHOL/HDL Ratio: 5
Triglycerides: 289 mg/dL — ABNORMAL HIGH (ref 0.0–149.0)
VLDL: 57.8 mg/dL — ABNORMAL HIGH (ref 0.0–40.0)

## 2010-12-04 LAB — COMPREHENSIVE METABOLIC PANEL
ALT: 23 U/L (ref 0–35)
AST: 27 U/L (ref 0–37)
Albumin: 4.3 g/dL (ref 3.5–5.2)
Alkaline Phosphatase: 91 U/L (ref 39–117)
BUN: 19 mg/dL (ref 6–23)
CO2: 29 mEq/L (ref 19–32)
Calcium: 8.6 mg/dL (ref 8.4–10.5)
Chloride: 98 mEq/L (ref 96–112)
Creatinine, Ser: 0.9 mg/dL (ref 0.4–1.2)
GFR: 64.66 mL/min (ref >60.00)
Glucose, Bld: 126 mg/dL — ABNORMAL HIGH (ref 70–99)
Potassium: 3.4 mEq/L — ABNORMAL LOW (ref 3.5–5.1)
Sodium: 136 mEq/L (ref 135–145)
Total Bilirubin: 0.5 mg/dL (ref 0.3–1.2)
Total Protein: 7.2 g/dL (ref 6.0–8.3)

## 2010-12-04 LAB — SEDIMENTATION RATE: Sed Rate: 24 mm/hr — ABNORMAL HIGH (ref 0–22)

## 2010-12-04 MED ORDER — NAPROXEN 500 MG PO TABS: 500.0000 mg | ORAL_TABLET | Freq: Two times a day (BID) | ORAL | Status: DC

## 2010-12-04 NOTE — Progress Notes (Signed)
Addended by: Rock Nephew T on: 12/04/2010 04:30 PM   Modules accepted: Orders

## 2010-12-04 NOTE — Assessment & Plan Note (Signed)
Worse. ? Etiol. Will get CT

## 2010-12-04 NOTE — Assessment & Plan Note (Signed)
Will get a CT

## 2010-12-04 NOTE — Progress Notes (Signed)
  Subjective:    Patient ID: [name], female    DOB: [birth year], 65 y.o.   MRN: [id number]  HPI  C/o R flank pain - not better, worse w/laing or sitting down; better w/walking. 7/10 in intensity.    R LBP over R posterior rib cage since Thur after she lifted a large water melon. It was worse on Sat. Worse with activity. Dull pain. She could not sleep for a few hrs last night due to pain. She is very worried Review of Systems     Objective:   Physical Exam        Assessment & Plan:   Subjective:    Patient ID: [name], female    DOB: [birth year], 65 y.o.   MRN: [id number]   Review of Systems  Respiratory: Negative for cough, choking, wheezing and stridor. Chest tightness: R chest wall pain.   Cardiovascular: Negative for chest pain.  Musculoskeletal: Positive for back pain.       Objective:   Physical Exam  Constitutional: She appears well-developed and well-nourished. No distress.  HENT:  Head: Normocephalic.  Right Ear: External ear normal.  Left Ear: External ear normal.  Nose: Nose normal.  Mouth/Throat: Oropharynx is clear and moist.  Eyes: Conjunctivae are normal. Pupils are equal, round, and reactive to light. Right eye exhibits no discharge. Left eye exhibits no discharge.  Neck: Normal range of motion. Neck supple. No JVD present. No tracheal deviation present. No thyromegaly present.  Cardiovascular: Normal rate, regular rhythm and normal heart sounds.   Pulmonary/Chest: No stridor. No respiratory distress. She has no wheezes. She exhibits some tenderness (tender over R posterolat chest wall, no rash, no crepitus).  Abdominal: Soft. Bowel sounds are normal. She exhibits no distension and no mass. There is no tenderness. There is no rebound and no guarding.  Musculoskeletal: She exhibits no edema and no tenderness.  Lymphadenopathy:    She has no cervical adenopathy.  Neurological: She displays normal reflexes. No cranial nerve deficit. She exhibits normal  muscle tone. Coordination normal.  Skin: No rash noted. No erythema.  Psychiatric: She has a normal mood and affect. Her behavior is normal. Judgment and thought content normal.      Assessment & Plan:

## 2010-12-04 NOTE — Assessment & Plan Note (Signed)
Worse due to pain. On Rx

## 2010-12-05 LAB — LDL CHOLESTEROL, DIRECT: Direct LDL: 214.8 mg/dL

## 2010-12-13 ENCOUNTER — Other Ambulatory Visit: Payer: Medicare Other

## 2010-12-15 ENCOUNTER — Ambulatory Visit (INDEPENDENT_AMBULATORY_CARE_PROVIDER_SITE_OTHER): Payer: Medicare Other | Admitting: Internal Medicine

## 2010-12-15 ENCOUNTER — Other Ambulatory Visit: Payer: Self-pay | Admitting: Internal Medicine

## 2010-12-15 ENCOUNTER — Encounter: Payer: Self-pay | Admitting: Internal Medicine

## 2010-12-15 DIAGNOSIS — R0789 Other chest pain: Secondary | ICD-10-CM

## 2010-12-15 DIAGNOSIS — R071 Chest pain on breathing: Secondary | ICD-10-CM

## 2010-12-15 NOTE — Progress Notes (Signed)
  Subjective:    Patient ID: [name], female    DOB: 1945/11/18, 65 y.o.   MRN: [id number]  HPI  F/u R side pain. She has been taking Naproxen 220 mg - much better. Due to improvement, she declined CT.  Review of Systems  Constitutional: Negative for chills.  Gastrointestinal: Negative for abdominal pain.  Genitourinary: Positive for flank pain (Right).  Musculoskeletal: Positive for back pain (R side - very little).  Skin: Negative for rash.       Objective:   Physical Exam  Constitutional: She appears well-developed and well-nourished. No distress.  HENT:  Head: Normocephalic.  Right Ear: External ear normal.  Left Ear: External ear normal.  Nose: Nose normal.  Mouth/Throat: Oropharynx is clear and moist.  Eyes: Conjunctivae are normal. Pupils are equal, round, and reactive to light. Right eye exhibits no discharge. Left eye exhibits no discharge.  Neck: Normal range of motion. Neck supple. No JVD present. No tracheal deviation present. No thyromegaly present.  Cardiovascular: Normal rate, regular rhythm and normal heart sounds.   Pulmonary/Chest: No stridor. No respiratory distress. She has no wheezes.  Abdominal: Soft. Bowel sounds are normal. She exhibits no distension and no mass. There is no tenderness. There is no rebound and no guarding.  Musculoskeletal: She exhibits no edema and no tenderness.  Lymphadenopathy:    She has no cervical adenopathy.  Neurological: She displays normal reflexes. No cranial nerve deficit. She exhibits normal muscle tone. Coordination normal.  Skin: No rash noted. No erythema.  Psychiatric: She has a normal mood and affect. Her behavior is normal. Judgment and thought content normal.       anxious          Assessment & Plan:

## 2010-12-15 NOTE — Patient Instructions (Signed)
Come back sooner if problems Continue with Naproxen until pain resolved

## 2010-12-19 ENCOUNTER — Encounter: Payer: Self-pay | Admitting: Internal Medicine

## 2010-12-19 NOTE — Assessment & Plan Note (Signed)
It is much better. She decided not to have a CT done. Will cont w/a symptomatic Rx and a watchful waiting

## 2011-01-08 ENCOUNTER — Other Ambulatory Visit: Payer: Self-pay | Admitting: *Deleted

## 2011-01-08 MED ORDER — DOXEPIN HCL 50 MG PO CAPS: 150.0000 mg | ORAL_CAPSULE | Freq: Every day | ORAL | Status: DC

## 2011-02-12 ENCOUNTER — Telehealth: Payer: Self-pay | Admitting: Cardiovascular Disease

## 2011-02-12 NOTE — Telephone Encounter (Signed)
I spoke with the pt and made her aware that her PCP checked her cholesterol in July and at this time she does not needs any labs drawn in our office.  The pt also said that she never started red yeast rice as instructed.  I made the pt aware that she is in recall for an appointment with Dr Excell Seltzer in February 2013.

## 2011-02-12 NOTE — Telephone Encounter (Signed)
Pt called back also wanted to know if she needs an appointment with dr Excell Seltzer

## 2011-02-12 NOTE — Telephone Encounter (Signed)
Pt has questions regarding medication and when to get labwork done she may not be home because she has things to do and can't stay home all day waiting on you to call

## 2011-03-13 ENCOUNTER — Ambulatory Visit (INDEPENDENT_AMBULATORY_CARE_PROVIDER_SITE_OTHER): Payer: Medicare Other | Admitting: Internal Medicine

## 2011-03-13 ENCOUNTER — Encounter: Payer: Self-pay | Admitting: Internal Medicine

## 2011-03-13 VITALS — BP 142/90 | HR 80 | Temp 98.0°F | Resp 16 | Wt 166.0 lb

## 2011-03-13 DIAGNOSIS — I2510 Atherosclerotic heart disease of native coronary artery without angina pectoris: Secondary | ICD-10-CM

## 2011-03-13 DIAGNOSIS — R739 Hyperglycemia, unspecified: Secondary | ICD-10-CM

## 2011-03-13 DIAGNOSIS — F329 Major depressive disorder, single episode, unspecified: Secondary | ICD-10-CM

## 2011-03-13 DIAGNOSIS — E039 Hypothyroidism, unspecified: Secondary | ICD-10-CM

## 2011-03-13 DIAGNOSIS — F3289 Other specified depressive episodes: Secondary | ICD-10-CM

## 2011-03-13 DIAGNOSIS — F411 Generalized anxiety disorder: Secondary | ICD-10-CM

## 2011-03-13 DIAGNOSIS — E785 Hyperlipidemia, unspecified: Secondary | ICD-10-CM

## 2011-03-13 DIAGNOSIS — M545 Low back pain, unspecified: Secondary | ICD-10-CM

## 2011-03-13 DIAGNOSIS — R7309 Other abnormal glucose: Secondary | ICD-10-CM

## 2011-03-13 DIAGNOSIS — I10 Essential (primary) hypertension: Secondary | ICD-10-CM

## 2011-03-13 NOTE — Assessment & Plan Note (Addendum)
Chronic. Statin intolerant. She is planning to try red rice yeast

## 2011-03-13 NOTE — Assessment & Plan Note (Signed)
Continue with current prescription therapy as reflected on the Med list.  

## 2011-03-13 NOTE — Assessment & Plan Note (Signed)
resolved 

## 2011-03-13 NOTE — Assessment & Plan Note (Addendum)
Continue with current prescription therapy as reflected on the Med list.  

## 2011-03-13 NOTE — Progress Notes (Signed)
  Subjective:    Patient ID: [name], female    DOB: 1946-01-27, 65 y.o.   MRN: [id number]  HPI   The patient is here to follow up on chronic CAD, depression, anxiety, headaches and chronic moderate OA/fibromyalgia symptoms controlled with medicines, diet and exercise. F/u LBP and flank pain pain - resolved...   Review of Systems  Constitutional: Negative for chills, activity change, appetite change, fatigue and unexpected weight change.  HENT: Negative for congestion, mouth sores and sinus pressure.   Eyes: Negative for visual disturbance.  Respiratory: Negative for cough and chest tightness.   Gastrointestinal: Negative for nausea and abdominal pain.  Genitourinary: Negative for frequency, difficulty urinating and vaginal pain.  Musculoskeletal: Negative for back pain and gait problem.  Skin: Negative for pallor and rash.  Neurological: Negative for dizziness, tremors, weakness, numbness and headaches.  Psychiatric/Behavioral: Negative for confusion and sleep disturbance. The patient is nervous/anxious.        Objective:   Physical Exam  Constitutional: She appears well-developed and well-nourished. No distress.  HENT:  Head: Normocephalic.  Right Ear: External ear normal.  Left Ear: External ear normal.  Nose: Nose normal.  Mouth/Throat: Oropharynx is clear and moist.  Eyes: Conjunctivae are normal. Pupils are equal, round, and reactive to light. Right eye exhibits no discharge. Left eye exhibits no discharge.  Neck: Normal range of motion. Neck supple. No JVD present. No tracheal deviation present. No thyromegaly present.  Cardiovascular: Normal rate, regular rhythm and normal heart sounds.   Pulmonary/Chest: No stridor. No respiratory distress. She has no wheezes.  Abdominal: Soft. Bowel sounds are normal. She exhibits no distension and no mass. There is no tenderness. There is no rebound and no guarding.  Musculoskeletal: She exhibits no edema and no tenderness.    Lymphadenopathy:    She has no cervical adenopathy.  Neurological: She displays normal reflexes. No cranial nerve deficit. She exhibits normal muscle tone. Coordination normal.  Skin: No rash noted. No erythema.  Psychiatric: She has a normal mood and affect. Her behavior is normal. Judgment and thought content normal.          Assessment & Plan:

## 2011-03-15 ENCOUNTER — Telehealth: Payer: Self-pay | Admitting: *Deleted

## 2011-03-15 DIAGNOSIS — Z0000 Encounter for general adult medical examination without abnormal findings: Secondary | ICD-10-CM

## 2011-03-15 NOTE — Telephone Encounter (Signed)
Pt scheduled for CPE in 05/2011. Labs entered.

## 2011-04-10 ENCOUNTER — Encounter: Payer: Self-pay | Admitting: Internal Medicine

## 2011-06-11 ENCOUNTER — Other Ambulatory Visit (INDEPENDENT_AMBULATORY_CARE_PROVIDER_SITE_OTHER): Payer: Medicare Other

## 2011-06-11 DIAGNOSIS — Z0000 Encounter for general adult medical examination without abnormal findings: Secondary | ICD-10-CM

## 2011-06-11 DIAGNOSIS — Z79899 Other long term (current) drug therapy: Secondary | ICD-10-CM

## 2011-06-11 LAB — URINALYSIS, ROUTINE W REFLEX MICROSCOPIC
Bilirubin Urine: NEGATIVE
Hgb urine dipstick: NEGATIVE
Ketones, ur: NEGATIVE
Leukocytes, UA: NEGATIVE
Nitrite: NEGATIVE
Specific Gravity, Urine: 1.025 (ref 1.000–1.030)
Total Protein, Urine: NEGATIVE
Urine Glucose: NEGATIVE
Urobilinogen, UA: 0.2 (ref 0.0–1.0)
pH: 5.5 (ref 5.0–8.0)

## 2011-06-11 LAB — CBC WITH DIFFERENTIAL/PLATELET
Basophils Absolute: 0 10*3/uL (ref 0.0–0.1)
Basophils Relative: 0.4 % (ref 0.0–3.0)
Eosinophils Absolute: 0.3 10*3/uL (ref 0.0–0.7)
Eosinophils Relative: 4.5 % (ref 0.0–5.0)
HCT: 36.1 % (ref 36.0–46.0)
Hemoglobin: 12.5 g/dL (ref 12.0–15.0)
Lymphocytes Relative: 31 % (ref 12.0–46.0)
Lymphs Abs: 1.7 10*3/uL (ref 0.7–4.0)
MCHC: 34.5 g/dL (ref 30.0–36.0)
MCV: 92.2 fl (ref 78.0–100.0)
Monocytes Absolute: 0.4 10*3/uL (ref 0.1–1.0)
Monocytes Relative: 6.6 % (ref 3.0–12.0)
Neutro Abs: 3.2 10*3/uL (ref 1.4–7.7)
Neutrophils Relative %: 57.5 % (ref 43.0–77.0)
Platelets: 234 10*3/uL (ref 150.0–400.0)
RBC: 3.92 Mil/uL (ref 3.87–5.11)
RDW: 12.5 % (ref 11.5–14.6)
WBC: 5.6 10*3/uL (ref 4.5–10.5)

## 2011-06-11 LAB — LIPID PANEL
Cholesterol: 290 mg/dL — ABNORMAL HIGH (ref 0–200)
HDL: 54.1 mg/dL (ref >39.00)
Total CHOL/HDL Ratio: 5
Triglycerides: 155 mg/dL — ABNORMAL HIGH (ref 0.0–149.0)
VLDL: 31 mg/dL (ref 0.0–40.0)

## 2011-06-11 LAB — BASIC METABOLIC PANEL
BUN: 16 mg/dL (ref 6–23)
CO2: 26 mEq/L (ref 19–32)
Calcium: 8.7 mg/dL (ref 8.4–10.5)
Chloride: 105 mEq/L (ref 96–112)
Creatinine, Ser: 0.9 mg/dL (ref 0.4–1.2)
GFR: 64.56 mL/min (ref >60.00)
Glucose, Bld: 93 mg/dL (ref 70–99)
Potassium: 3.6 mEq/L (ref 3.5–5.1)
Sodium: 140 mEq/L (ref 135–145)

## 2011-06-11 LAB — LDL CHOLESTEROL, DIRECT: Direct LDL: 204.6 mg/dL

## 2011-06-11 LAB — HEPATIC FUNCTION PANEL
ALT: 20 U/L (ref 0–35)
AST: 23 U/L (ref 0–37)
Albumin: 3.6 g/dL (ref 3.5–5.2)
Alkaline Phosphatase: 90 U/L (ref 39–117)
Bilirubin, Direct: 0 mg/dL (ref 0.0–0.3)
Total Bilirubin: 0.6 mg/dL (ref 0.3–1.2)
Total Protein: 6.6 g/dL (ref 6.0–8.3)

## 2011-06-11 LAB — TSH: TSH: 0.64 u[IU]/mL (ref 0.35–5.50)

## 2011-06-13 ENCOUNTER — Encounter: Payer: Self-pay | Admitting: Internal Medicine

## 2011-06-13 ENCOUNTER — Ambulatory Visit (INDEPENDENT_AMBULATORY_CARE_PROVIDER_SITE_OTHER): Payer: Medicare Other | Admitting: Internal Medicine

## 2011-06-13 DIAGNOSIS — I10 Essential (primary) hypertension: Secondary | ICD-10-CM

## 2011-06-13 DIAGNOSIS — I2510 Atherosclerotic heart disease of native coronary artery without angina pectoris: Secondary | ICD-10-CM

## 2011-06-13 DIAGNOSIS — Z0000 Encounter for general adult medical examination without abnormal findings: Secondary | ICD-10-CM

## 2011-06-13 MED ORDER — POTASSIUM CHLORIDE CRYS ER 20 MEQ PO TBCR: 20.0000 meq | EXTENDED_RELEASE_TABLET | Freq: Two times a day (BID) | ORAL | Status: DC

## 2011-06-13 MED ORDER — NITROGLYCERIN 0.4 MG/SPRAY TL SOLN: 1.0000 | Status: DC | PRN

## 2011-06-13 MED ORDER — DOXEPIN HCL 50 MG PO CAPS: 150.0000 mg | ORAL_CAPSULE | Freq: Every day | ORAL | Status: DC

## 2011-06-13 MED ORDER — LEVOTHYROXINE SODIUM 125 MCG PO TABS: 125.0000 ug | ORAL_TABLET | Freq: Every day | ORAL | Status: DC

## 2011-06-13 MED ORDER — CLORAZEPATE DIPOTASSIUM 15 MG PO TABS: 15.0000 mg | ORAL_TABLET | Freq: Three times a day (TID) | ORAL | Status: DC | PRN

## 2011-06-13 MED ORDER — FUROSEMIDE 40 MG PO TABS: 40.0000 mg | ORAL_TABLET | Freq: Two times a day (BID) | ORAL | Status: DC

## 2011-06-13 MED ORDER — DILTIAZEM HCL ER 120 MG PO CP12: 120.0000 mg | ORAL_CAPSULE | Freq: Two times a day (BID) | ORAL | Status: DC

## 2011-06-13 MED ORDER — ESTRADIOL CYPIONATE 5 MG/ML IM OIL: 0.2500 mg | TOPICAL_OIL | INTRAMUSCULAR | Status: DC

## 2011-06-13 MED ORDER — METOPROLOL TARTRATE 50 MG PO TABS: 50.0000 mg | ORAL_TABLET | Freq: Three times a day (TID) | ORAL | Status: DC

## 2011-06-13 MED ORDER — LORAZEPAM 0.5 MG PO TABS: 0.5000 mg | ORAL_TABLET | Freq: Every day | ORAL | Status: DC

## 2011-06-13 NOTE — Assessment & Plan Note (Signed)
Continue with current prescription therapy as reflected on the Med list.  

## 2011-06-13 NOTE — Assessment & Plan Note (Signed)
Continue with current prescription therapy as reflected on the Med list. Chronic. On Rx. BP is OK at home. BP Readings from Last 3 Encounters:  06/13/11 160/100  03/13/11 142/90  12/15/10 152/80

## 2011-06-13 NOTE — Patient Instructions (Signed)
Check BP at home  --- goal BP<130/85

## 2011-06-13 NOTE — Assessment & Plan Note (Addendum)
The patient is here for annual Medicare wellness examination and management of other chronic and acute problems.   The risk factors are reflected in the social history.  The roster of all physicians providing medical care to patient - is listed in the Snapshot section of the chart.  Activities of daily living:  The patient is 100% inedpendent in all ADLs: dressing, toileting, feeding as well as independent mobility  Home safety : The patient has smoke detectors in the home. They wear seatbelts.No firearms at home ( firearms are present in the home, kept in a safe fashion). There is no violence in the home.   There is no risks for hepatitis, STDs or HIV. There is no   history of blood transfusion. They have no travel history to infectious disease endemic areas of the world.  The patient has (has not) seen their dentist in the last six month. They have (not) seen their eye doctor in the last year. They deny (admit to) any hearing difficulty and have not had audiologic testing in the last year.  They do not  have excessive sun exposure. Discussed the need for sun protection: hats, long sleeves and use of sunscreen if there is significant sun exposure.   Diet: the importance of a healthy diet is discussed. They do have a healthy (unhealthy-high fat/fast food) diet.  The patient has a regular exercise program: not lately.  The benefits of regular aerobic exercise were discussed.  Depression screen: there are no signs or vegative symptoms of depression- irritability, change in appetite, anhedonia, sadness/tearfullness.  Cognitive assessment: the patient manages all their financial and personal affairs and is actively engaged. They could relate day,date,year and events; recalled 3/3 objects at 3 minutes; performed clock-face test normally.  The following portions of the patient's history were reviewed and updated as appropriate: allergies, current medications, past family history, past medical  history,  past surgical history, past social history  and problem list.  Vision, hearing, body mass index were assessed and reviewed.   During the course of the visit the patient was educated and counseled about appropriate screening and preventive services including : fall prevention , diabetes screening, nutrition counseling, colorectal cancer screening, and recommended immunizations.  GYN exam is up to date Declined colonoscopy

## 2011-06-16 NOTE — Progress Notes (Signed)
  Subjective:    Patient ID: [name], female    DOB: [birth year], 66 y.o.   MRN: [id number]  HPI  The patient is here for a wellness exam. The patient has been doing well overall without major physical or psychological issues going on lately. The patient needs to address  chronic hypertension that has been well controlled with medicines; to address chronic  hyperlipidemia controlled with medicines as well; and to address anxiety, controlled with medical treatment and diet.   Review of Systems  Constitutional: Negative for chills, activity change, appetite change, fatigue and unexpected weight change.  HENT: Negative for hearing loss, congestion, facial swelling, mouth sores, voice change and sinus pressure.   Eyes: Negative for visual disturbance.  Respiratory: Negative for cough and chest tightness.   Gastrointestinal: Negative for nausea, vomiting, abdominal pain, diarrhea and rectal pain.  Genitourinary: Negative for frequency, decreased urine volume, difficulty urinating, vaginal pain and pelvic pain.  Musculoskeletal: Positive for back pain and arthralgias. Negative for myalgias and gait problem.  Skin: Negative for pallor, rash and wound.  Neurological: Negative for dizziness, tremors, weakness, numbness and headaches.  Hematological: Negative for adenopathy.  Psychiatric/Behavioral: Negative for suicidal ideas, confusion and sleep disturbance. The patient is nervous/anxious.        Objective:   Physical Exam  Constitutional: She appears well-developed and well-nourished. No distress.  HENT:  Head: Normocephalic.  Right Ear: External ear normal.  Left Ear: External ear normal.  Nose: Nose normal.  Mouth/Throat: Oropharynx is clear and moist.  Eyes: Conjunctivae are normal. Pupils are equal, round, and reactive to light. Right eye exhibits no discharge. Left eye exhibits no discharge.  Neck: Normal range of motion. Neck supple. No JVD present. No tracheal deviation present.  No thyromegaly present.  Cardiovascular: Normal rate, regular rhythm and normal heart sounds.   Pulmonary/Chest: No stridor. No respiratory distress. She has no wheezes.  Abdominal: Soft. Bowel sounds are normal. She exhibits no distension and no mass. There is no tenderness. There is no rebound and no guarding.  Musculoskeletal: She exhibits no edema and no tenderness.  Lymphadenopathy:    She has no cervical adenopathy.  Neurological: She displays normal reflexes. No cranial nerve deficit. She exhibits normal muscle tone. Coordination normal.  Skin: No rash noted. No erythema.  Psychiatric: She has a normal mood and affect. Her behavior is normal. Judgment and thought content normal.   Lab Results  Component Value Date   WBC 5.6 06/11/2011   HGB 12.5 06/11/2011   HCT 36.1 06/11/2011   PLT 234.0 06/11/2011   GLUCOSE 93 06/11/2011   CHOL 290* 06/11/2011   TRIG 155.0* 06/11/2011   HDL 54.10 06/11/2011   LDLDIRECT 204.6 06/11/2011   LDLCALC 76 03/21/2009   ALT 20 06/11/2011   AST 23 06/11/2011   NA 140 06/11/2011   K 3.6 06/11/2011   CL 105 06/11/2011   CREATININE 0.9 06/11/2011   BUN 16 06/11/2011   CO2 26 06/11/2011   TSH 0.64 06/11/2011   INR 1.09 08/22/2009         Assessment & Plan:

## 2011-06-18 ENCOUNTER — Telehealth: Payer: Self-pay | Admitting: *Deleted

## 2011-06-18 NOTE — Telephone Encounter (Signed)
Pt is requesting written Rx for Nifedipine ER 30 mg so she can take to pharmacy and Rf on Doxepin 50 mg. Please advise.

## 2011-06-19 MED ORDER — NIFEDIPINE ER OSMOTIC RELEASE 30 MG PO TB24: 30.0000 mg | ORAL_TABLET | Freq: Every day | ORAL | Status: DC

## 2011-06-19 NOTE — Telephone Encounter (Signed)
OK to fill both prescriptiosn with additional refills x 1 year Thank you!

## 2011-06-19 NOTE — Telephone Encounter (Signed)
Rx for Nifedipine printed for pt to p/u. She states we need to disregard the PA request for Doxepin. It is not needed/the pharmacy made an error.

## 2011-06-22 ENCOUNTER — Encounter: Payer: Medicare Other | Admitting: Internal Medicine

## 2011-06-26 ENCOUNTER — Ambulatory Visit (INDEPENDENT_AMBULATORY_CARE_PROVIDER_SITE_OTHER): Payer: Medicare Other | Admitting: Cardiovascular Disease

## 2011-06-26 ENCOUNTER — Encounter: Payer: Self-pay | Admitting: Cardiovascular Disease

## 2011-06-26 VITALS — BP 152/80 | HR 65 | Ht 68.0 in | Wt 170.0 lb

## 2011-06-26 DIAGNOSIS — I2510 Atherosclerotic heart disease of native coronary artery without angina pectoris: Secondary | ICD-10-CM

## 2011-06-26 DIAGNOSIS — I10 Essential (primary) hypertension: Secondary | ICD-10-CM

## 2011-06-26 DIAGNOSIS — E785 Hyperlipidemia, unspecified: Secondary | ICD-10-CM

## 2011-06-26 NOTE — Assessment & Plan Note (Signed)
Blood pressure is elevated at the office visit. However, I reviewed her home readings in an excellent range with systolics in the 120s and diastolics in the 70s. Continue current medications.

## 2011-06-26 NOTE — Patient Instructions (Signed)
Your physician wants you to follow-up in: 1 YEAR.  You will receive a reminder letter in the mail two months in advance. If you don't receive a letter, please call our office to schedule the follow-up appointment.  Your physician recommends that you continue on your current medications as directed. Please refer to the Current Medication list given to you today.  Your physician has requested that you have a carotid duplex in 1 YEAR. This test is an ultrasound of the carotid arteries in your neck. It looks at blood flow through these arteries that supply the brain with blood. Allow one hour for this exam. There are no restrictions or special instructions.

## 2011-06-26 NOTE — Assessment & Plan Note (Signed)
Lipids are well above goal. Limitations of lipid-lowering therapy noted with her intolerance of statins and fear of taking other non-statin agents. I recommended a referral to the lipid clinic and she will call back if she becomes interested in this. In the meantime we had a discussion about lifestyle modification, dietary changes, and weight loss.

## 2011-06-26 NOTE — Progress Notes (Signed)
HPI:  This is a 66 year old woman presenting for followup evaluation.  She has coronary artery disease and history of remote PCI of the LAD. Cardiac catheterization in 2011 demonstrated wide patency of her stent site and no significant CAD. The patient is intolerant to statin agents and other lipid-lowering therapies. She had profound leg weakness on simvastatin. Last year I recommended a trial of red yeast rice but she was afraid to do this. We have discussed other alternatives which she has not been inclined to try.  The patient denies chest pain or pressure. She does complain of dyspnea with exertion. She denies leg swelling. She notes that she has gained weight and has not been physically active. She describes shortness of breath with activity such as carrying groceries. She denies orthopnea, PND, lightheadedness, or syncope.  Outpatient Encounter Prescriptions as of 06/26/2011  Medication Sig Dispense Refill  . aspirin 81 MG EC tablet Take 81 mg by mouth daily.        . Cholecalciferol 1000 UNITS tablet Take 1,000 Units by mouth daily.        . clorazepate (TRANXENE) 15 MG tablet Take 1 tablet (15 mg total) by mouth 3 (three) times daily as needed.  270 tablet  1  . doxepin (SINEQUAN) 50 MG capsule Take 3 capsules (150 mg total) by mouth at bedtime.  270 capsule  3  . estradiol cypionate (DEPO-ESTRADIOL) 5 MG/ML injection Inject 0.1 mLs (0.5 mg total) into the muscle as directed. Every 18-25 days  5 mL  3  . furosemide (LASIX) 40 MG tablet Take 1 tablet (40 mg total) by mouth 2 (two) times daily.  180 tablet  3  . levothyroxine (SYNTHROID, LEVOTHROID) 125 MCG tablet Take 1 tablet (125 mcg total) by mouth daily.  90 tablet  3  . LORazepam (ATIVAN) 0.5 MG tablet Take 1 tablet (0.5 mg total) by mouth at bedtime.  90 tablet  1  . NIFEdipine (PROCARDIA XL/ADALAT-CC) 30 MG 24 hr tablet Take 1 tablet (30 mg total) by mouth daily.  90 tablet  3  . nitroGLYCERIN (NITROLINGUAL) 0.4 MG/SPRAY spray Place 1  spray under the tongue every 5 (five) minutes as needed for chest pain. For chest pain - may repeat x 3  12 g  3  . potassium chloride (KLOR-CON) 20 MEQ packet Take 20 mEq by mouth 2 (two) times daily.        . metoprolol (LOPRESSOR) 50 MG tablet Take 1 tablet (50 mg total) by mouth 3 (three) times daily.  270 tablet  3  . traMADol (ULTRAM) 50 MG tablet Take 50-100 mg by mouth 2 (two) times daily as needed.        . triamcinolone (KENALOG) 0.5 % ointment Apply topically 2 (two) times daily as needed.        Marland Kitchen DISCONTD: diltiazem (CARDIZEM SR) 120 MG 12 hr capsule Take 1 capsule (120 mg total) by mouth 2 (two) times daily.  90 capsule  3  . DISCONTD: naproxen sodium (ANAPROX) 220 MG tablet Take 220-440 mg by mouth as needed.        Marland Kitchen DISCONTD: potassium chloride SA (KLOR-CON M20) 20 MEQ tablet Take 1 tablet (20 mEq total) by mouth 2 (two) times daily.  180 tablet  3  . DISCONTD: Red Yeast Rice Extract (RED YEAST RICE PO) Take by mouth as directed.          Allergies  Allergen Reactions  . Alprazolam     REACTION: very sedating  .  Amoxicillin   . Atorvastatin     REACTION: weakness  . Ciprofloxacin   . Codeine   . Diphenhydramine Hcl   . Epinephrine   . Ezetimibe     REACTION: myalgia  . Isosorbide Mononitrate   . Lidocaine   . Morphine   . Propoxyphene N-Acetaminophen   . Rosuvastatin     REACTION: leg weakness  . Simvastatin     REACTION: leg  weakness  . Tetracycline     Past Medical History  Diagnosis Date  . Bronchitis, acute   . Carotid bruit   . De Quervain's tenosynovitis   . Unspecified hypothyroidism   . Unspecified essential hypertension   . Other and unspecified hyperlipidemia   . Depressive disorder, not elsewhere classified   . Coronary atherosclerosis of unspecified type of vessel, native or graft     multiple angioplasy procedures in 90's/early 2000's  . Anxiety state, unspecified   . Female bladder prolapse   . Lichen 2011    Vaginal    ROS: Negative  except as per HPI  BP 152/80  Pulse 65  Ht 5\' 8"  (1.727 m)  Wt 77.111 kg (170 lb)  BMI 25.85 kg/m2  PHYSICAL EXAM: Pt is alert and oriented, NAD HEENT: normal Neck: JVP - normal, carotids 2+= with bilateral bruits Lungs: CTA bilaterally CV: RRR without murmur or gallop Abd: soft, NT, Positive BS, no hepatomegaly Ext: no C/C/E, distal pulses intact and equal Skin: warm/dry no rash  EKG:  Normal sinus rhythm 65 beats per minute, baseline wander, within normal limits.  ASSESSMENT AND PLAN:

## 2011-06-26 NOTE — Assessment & Plan Note (Signed)
Cardiac catheter results reviewed from 2011. She is not having symptoms of angina. Recommend continuation of aspirin for antiplatelet therapy. She is also on a beta blocker. She does not tolerate lipid-lowering drugs. See below for discussion. The patient will followup in one year.

## 2011-09-11 ENCOUNTER — Ambulatory Visit (INDEPENDENT_AMBULATORY_CARE_PROVIDER_SITE_OTHER): Payer: Medicare Other | Admitting: Internal Medicine

## 2011-09-11 ENCOUNTER — Encounter: Payer: Self-pay | Admitting: Internal Medicine

## 2011-09-11 VITALS — BP 168/90 | HR 80 | Temp 97.2°F | Resp 16 | Wt 165.0 lb

## 2011-09-11 DIAGNOSIS — F329 Major depressive disorder, single episode, unspecified: Secondary | ICD-10-CM

## 2011-09-11 DIAGNOSIS — I10 Essential (primary) hypertension: Secondary | ICD-10-CM

## 2011-09-11 DIAGNOSIS — E039 Hypothyroidism, unspecified: Secondary | ICD-10-CM

## 2011-09-11 DIAGNOSIS — E785 Hyperlipidemia, unspecified: Secondary | ICD-10-CM

## 2011-09-11 DIAGNOSIS — I2510 Atherosclerotic heart disease of native coronary artery without angina pectoris: Secondary | ICD-10-CM

## 2011-09-11 DIAGNOSIS — F411 Generalized anxiety disorder: Secondary | ICD-10-CM

## 2011-09-11 NOTE — Assessment & Plan Note (Signed)
Continue with current prescription therapy as reflected on the Med list.  

## 2011-09-11 NOTE — Assessment & Plan Note (Signed)
Chronic. On Rx. BP is OK at home. Continue with current prescription therapy as reflected on the Med list.

## 2011-09-11 NOTE — Progress Notes (Signed)
Patient ID: [name], female   DOB: 04/18/1946, 66 y.o.   MRN: [id number]  Subjective:    Patient ID: [name], female    DOB: May 23, 1945, 66 y.o.   MRN: [id number]  HPI   The patient has been doing well overall without major physical or psychological issues going on lately. The patient needs to address  chronic hypertension that has been well controlled with medicines; to address chronic  hyperlipidemia controlled with medicines as well; and to address anxiety, CAD, controlled with medical treatment and diet.  BP Readings from Last 3 Encounters:  09/11/11 168/90  06/26/11 152/80  06/13/11 160/100   Wt Readings from Last 3 Encounters:  09/11/11 165 lb (74.844 kg)  06/26/11 170 lb (77.111 kg)  06/13/11 169 lb (76.658 kg)     Review of Systems  Constitutional: Negative for chills, activity change, appetite change, fatigue and unexpected weight change.  HENT: Negative for hearing loss, congestion, facial swelling, mouth sores, voice change and sinus pressure.   Eyes: Negative for visual disturbance.  Respiratory: Negative for cough and chest tightness.   Gastrointestinal: Negative for nausea, vomiting, abdominal pain, diarrhea and rectal pain.  Genitourinary: Negative for frequency, decreased urine volume, difficulty urinating, vaginal pain and pelvic pain.  Musculoskeletal: Positive for back pain and arthralgias. Negative for myalgias and gait problem.  Skin: Negative for pallor, rash and wound.  Neurological: Negative for dizziness, tremors, weakness, numbness and headaches.  Hematological: Negative for adenopathy.  Psychiatric/Behavioral: Negative for suicidal ideas, confusion and sleep disturbance. The patient is nervous/anxious.        Objective:   Physical Exam  Constitutional: She appears well-developed and well-nourished. No distress.  HENT:  Head: Normocephalic.  Right Ear: External ear normal.  Left Ear: External ear normal.  Nose: Nose normal.  Mouth/Throat:  Oropharynx is clear and moist.  Eyes: Conjunctivae are normal. Pupils are equal, round, and reactive to light. Right eye exhibits no discharge. Left eye exhibits no discharge.  Neck: Normal range of motion. Neck supple. No JVD present. No tracheal deviation present. No thyromegaly present.  Cardiovascular: Normal rate, regular rhythm and normal heart sounds.   Pulmonary/Chest: No stridor. No respiratory distress. She has no wheezes.  Abdominal: Soft. Bowel sounds are normal. She exhibits no distension and no mass. There is no tenderness. There is no rebound and no guarding.  Musculoskeletal: She exhibits no edema and no tenderness.  Lymphadenopathy:    She has no cervical adenopathy.  Neurological: She displays normal reflexes. No cranial nerve deficit. She exhibits normal muscle tone. Coordination normal.  Skin: No rash noted. No erythema.  Psychiatric: She has a normal mood and affect. Her behavior is normal. Judgment and thought content normal.    Lab Results  Component Value Date   WBC 5.6 06/11/2011   HGB 12.5 06/11/2011   HCT 36.1 06/11/2011   PLT 234.0 06/11/2011   GLUCOSE 93 06/11/2011   CHOL 290* 06/11/2011   TRIG 155.0* 06/11/2011   HDL 54.10 06/11/2011   LDLDIRECT 204.6 06/11/2011   LDLCALC 76 03/21/2009   ALT 20 06/11/2011   AST 23 06/11/2011   NA 140 06/11/2011   K 3.6 06/11/2011   CL 105 06/11/2011   CREATININE 0.9 06/11/2011   BUN 16 06/11/2011   CO2 26 06/11/2011   TSH 0.64 06/11/2011   INR 1.09 08/22/2009         Assessment & Plan:

## 2012-01-10 ENCOUNTER — Other Ambulatory Visit (INDEPENDENT_AMBULATORY_CARE_PROVIDER_SITE_OTHER): Payer: Medicare Other

## 2012-01-10 DIAGNOSIS — I2510 Atherosclerotic heart disease of native coronary artery without angina pectoris: Secondary | ICD-10-CM

## 2012-01-10 DIAGNOSIS — E039 Hypothyroidism, unspecified: Secondary | ICD-10-CM

## 2012-01-10 DIAGNOSIS — E785 Hyperlipidemia, unspecified: Secondary | ICD-10-CM

## 2012-01-10 DIAGNOSIS — I10 Essential (primary) hypertension: Secondary | ICD-10-CM

## 2012-01-10 DIAGNOSIS — F329 Major depressive disorder, single episode, unspecified: Secondary | ICD-10-CM

## 2012-01-10 DIAGNOSIS — F411 Generalized anxiety disorder: Secondary | ICD-10-CM

## 2012-01-10 LAB — BASIC METABOLIC PANEL
BUN: 13 mg/dL (ref 6–23)
CO2: 27 mEq/L (ref 19–32)
Calcium: 8.8 mg/dL (ref 8.4–10.5)
Chloride: 101 mEq/L (ref 96–112)
Creatinine, Ser: 0.9 mg/dL (ref 0.4–1.2)
GFR: 68.74 mL/min (ref >60.00)
Glucose, Bld: 100 mg/dL — ABNORMAL HIGH (ref 70–99)
Potassium: 3.7 mEq/L (ref 3.5–5.1)
Sodium: 135 mEq/L (ref 135–145)

## 2012-01-10 LAB — HEPATIC FUNCTION PANEL
ALT: 13 U/L (ref 0–35)
AST: 20 U/L (ref 0–37)
Albumin: 3.8 g/dL (ref 3.5–5.2)
Alkaline Phosphatase: 77 U/L (ref 39–117)
Bilirubin, Direct: 0.1 mg/dL (ref 0.0–0.3)
Total Bilirubin: 0.7 mg/dL (ref 0.3–1.2)
Total Protein: 6.9 g/dL (ref 6.0–8.3)

## 2012-01-10 LAB — URINALYSIS
Bilirubin Urine: NEGATIVE
Hgb urine dipstick: NEGATIVE
Ketones, ur: NEGATIVE
Leukocytes, UA: NEGATIVE
Nitrite: NEGATIVE
Specific Gravity, Urine: 1.02 (ref 1.000–1.030)
Total Protein, Urine: NEGATIVE
Urine Glucose: NEGATIVE
Urobilinogen, UA: 0.2 (ref 0.0–1.0)
pH: 6 (ref 5.0–8.0)

## 2012-01-10 LAB — LIPID PANEL
Cholesterol: 264 mg/dL — ABNORMAL HIGH (ref 0–200)
HDL: 54.7 mg/dL (ref >39.00)
Total CHOL/HDL Ratio: 5
Triglycerides: 197 mg/dL — ABNORMAL HIGH (ref 0.0–149.0)
VLDL: 39.4 mg/dL (ref 0.0–40.0)

## 2012-01-10 LAB — LDL CHOLESTEROL, DIRECT: Direct LDL: 188.2 mg/dL

## 2012-01-10 LAB — TSH: TSH: 0.23 u[IU]/mL — ABNORMAL LOW (ref 0.35–5.50)

## 2012-01-15 ENCOUNTER — Ambulatory Visit (INDEPENDENT_AMBULATORY_CARE_PROVIDER_SITE_OTHER): Payer: Medicare Other | Admitting: Internal Medicine

## 2012-01-15 ENCOUNTER — Encounter: Payer: Self-pay | Admitting: Internal Medicine

## 2012-01-15 VITALS — BP 154/82 | HR 71 | Temp 97.0°F | Resp 15 | Wt 165.5 lb

## 2012-01-15 DIAGNOSIS — I2510 Atherosclerotic heart disease of native coronary artery without angina pectoris: Secondary | ICD-10-CM

## 2012-01-15 DIAGNOSIS — E039 Hypothyroidism, unspecified: Secondary | ICD-10-CM

## 2012-01-15 DIAGNOSIS — E785 Hyperlipidemia, unspecified: Secondary | ICD-10-CM

## 2012-01-15 DIAGNOSIS — I10 Essential (primary) hypertension: Secondary | ICD-10-CM

## 2012-01-15 MED ORDER — CLORAZEPATE DIPOTASSIUM 15 MG PO TABS: 15.0000 mg | ORAL_TABLET | Freq: Three times a day (TID) | ORAL | Status: DC | PRN

## 2012-01-15 MED ORDER — LORAZEPAM 0.5 MG PO TABS: 0.5000 mg | ORAL_TABLET | Freq: Every day | ORAL | Status: DC

## 2012-01-15 NOTE — Assessment & Plan Note (Signed)
Continue with current prescription therapy as reflected on the Med list.  

## 2012-01-15 NOTE — Assessment & Plan Note (Signed)
Start Synthroid 125 mcg 1/2 tab qd

## 2012-01-15 NOTE — Progress Notes (Signed)
   Subjective:    Patient ID: [name], female    DOB: 06/18/1945, 66 y.o.   MRN: [id number]  HPI  The patient needs to address  chronic hypertension that has been well controlled with medicines; to address chronic  hyperlipidemia controlled with medicines as well; and to address anxiety, CAD, controlled with medical treatment and diet.  BP Readings from Last 3 Encounters:  01/15/12 154/82  09/11/11 168/90  06/26/11 152/80   Wt Readings from Last 3 Encounters:  01/15/12 165 lb 8 oz (75.07 kg)  09/11/11 165 lb (74.844 kg)  06/26/11 170 lb (77.111 kg)     Review of Systems  Constitutional: Negative for chills, activity change, appetite change, fatigue and unexpected weight change.  HENT: Negative for hearing loss, congestion, facial swelling, mouth sores, voice change and sinus pressure.   Eyes: Negative for visual disturbance.  Respiratory: Negative for cough and chest tightness.   Gastrointestinal: Negative for nausea, vomiting, abdominal pain, diarrhea and rectal pain.  Genitourinary: Negative for frequency, decreased urine volume, difficulty urinating, vaginal pain and pelvic pain.  Musculoskeletal: Positive for back pain and arthralgias. Negative for myalgias and gait problem.  Skin: Negative for pallor, rash and wound.  Neurological: Negative for dizziness, tremors, weakness, numbness and headaches.  Hematological: Negative for adenopathy.  Psychiatric/Behavioral: Negative for suicidal ideas, confusion and disturbed wake/sleep cycle. The patient is nervous/anxious.        Objective:   Physical Exam  Constitutional: She appears well-developed and well-nourished. No distress.  HENT:  Head: Normocephalic.  Right Ear: External ear normal.  Left Ear: External ear normal.  Nose: Nose normal.  Mouth/Throat: Oropharynx is clear and moist.  Eyes: Conjunctivae are normal. Pupils are equal, round, and reactive to light. Right eye exhibits no discharge. Left eye exhibits no  discharge.  Neck: Normal range of motion. Neck supple. No JVD present. No tracheal deviation present. No thyromegaly present.  Cardiovascular: Normal rate, regular rhythm and normal heart sounds.   Pulmonary/Chest: No stridor. No respiratory distress. She has no wheezes.  Abdominal: Soft. Bowel sounds are normal. She exhibits no distension and no mass. There is no tenderness. There is no rebound and no guarding.  Musculoskeletal: She exhibits no edema and no tenderness.  Lymphadenopathy:    She has no cervical adenopathy.  Neurological: She displays normal reflexes. No cranial nerve deficit. She exhibits normal muscle tone. Coordination normal.  Skin: No rash noted. No erythema.  Psychiatric: She has a normal mood and affect. Her behavior is normal. Judgment and thought content normal.    Lab Results  Component Value Date   WBC 5.6 06/11/2011   HGB 12.5 06/11/2011   HCT 36.1 06/11/2011   PLT 234.0 06/11/2011   GLUCOSE 100* 01/10/2012   CHOL 264* 01/10/2012   TRIG 197.0* 01/10/2012   HDL 54.70 01/10/2012   LDLDIRECT 188.2 01/10/2012   LDLCALC 76 03/21/2009   ALT 13 01/10/2012   AST 20 01/10/2012   NA 135 01/10/2012   K 3.7 01/10/2012   CL 101 01/10/2012   CREATININE 0.9 01/10/2012   BUN 13 01/10/2012   CO2 27 01/10/2012   TSH 0.23* 01/10/2012   INR 1.09 08/22/2009         Assessment & Plan:

## 2012-05-02 ENCOUNTER — Other Ambulatory Visit (INDEPENDENT_AMBULATORY_CARE_PROVIDER_SITE_OTHER): Payer: Medicare Other

## 2012-05-02 DIAGNOSIS — E039 Hypothyroidism, unspecified: Secondary | ICD-10-CM

## 2012-05-02 DIAGNOSIS — I10 Essential (primary) hypertension: Secondary | ICD-10-CM

## 2012-05-02 DIAGNOSIS — M545 Low back pain, unspecified: Secondary | ICD-10-CM

## 2012-05-02 DIAGNOSIS — E785 Hyperlipidemia, unspecified: Secondary | ICD-10-CM

## 2012-05-02 DIAGNOSIS — F329 Major depressive disorder, single episode, unspecified: Secondary | ICD-10-CM

## 2012-05-02 DIAGNOSIS — R739 Hyperglycemia, unspecified: Secondary | ICD-10-CM

## 2012-05-02 DIAGNOSIS — I2510 Atherosclerotic heart disease of native coronary artery without angina pectoris: Secondary | ICD-10-CM

## 2012-05-02 DIAGNOSIS — F411 Generalized anxiety disorder: Secondary | ICD-10-CM

## 2012-05-02 LAB — BASIC METABOLIC PANEL
BUN: 15 mg/dL (ref 6–23)
CO2: 30 mEq/L (ref 19–32)
Calcium: 9 mg/dL (ref 8.4–10.5)
Chloride: 100 mEq/L (ref 96–112)
Creatinine, Ser: 1 mg/dL (ref 0.4–1.2)
GFR: 56.52 mL/min — ABNORMAL LOW (ref >60.00)
Glucose, Bld: 98 mg/dL (ref 70–99)
Potassium: 3.7 mEq/L (ref 3.5–5.1)
Sodium: 137 mEq/L (ref 135–145)

## 2012-05-02 LAB — TSH: TSH: 3.33 u[IU]/mL (ref 0.35–5.50)

## 2012-05-09 ENCOUNTER — Ambulatory Visit (INDEPENDENT_AMBULATORY_CARE_PROVIDER_SITE_OTHER): Payer: Medicare Other | Admitting: Internal Medicine

## 2012-05-09 ENCOUNTER — Encounter: Payer: Self-pay | Admitting: Internal Medicine

## 2012-05-09 VITALS — BP 120/70 | HR 80 | Temp 98.1°F | Resp 16 | Wt 165.0 lb

## 2012-05-09 DIAGNOSIS — F3289 Other specified depressive episodes: Secondary | ICD-10-CM

## 2012-05-09 DIAGNOSIS — E785 Hyperlipidemia, unspecified: Secondary | ICD-10-CM

## 2012-05-09 DIAGNOSIS — F329 Major depressive disorder, single episode, unspecified: Secondary | ICD-10-CM

## 2012-05-09 DIAGNOSIS — I10 Essential (primary) hypertension: Secondary | ICD-10-CM

## 2012-05-09 DIAGNOSIS — E039 Hypothyroidism, unspecified: Secondary | ICD-10-CM

## 2012-05-09 DIAGNOSIS — F411 Generalized anxiety disorder: Secondary | ICD-10-CM

## 2012-05-09 DIAGNOSIS — I2510 Atherosclerotic heart disease of native coronary artery without angina pectoris: Secondary | ICD-10-CM

## 2012-05-09 MED ORDER — POTASSIUM CHLORIDE CRYS ER 20 MEQ PO TBCR: 20.0000 meq | EXTENDED_RELEASE_TABLET | Freq: Two times a day (BID) | ORAL | Status: DC

## 2012-05-09 MED ORDER — DOXEPIN HCL 50 MG PO CAPS: 150.0000 mg | ORAL_CAPSULE | Freq: Every day | ORAL | Status: DC

## 2012-05-09 MED ORDER — NIFEDIPINE ER OSMOTIC RELEASE 30 MG PO TB24: 30.0000 mg | ORAL_TABLET | Freq: Every day | ORAL | Status: DC

## 2012-05-09 MED ORDER — NITROGLYCERIN 0.4 MG/SPRAY TL SOLN: 1.0000 | Status: DC | PRN

## 2012-05-09 MED ORDER — CLORAZEPATE DIPOTASSIUM 15 MG PO TABS: 15.0000 mg | ORAL_TABLET | Freq: Three times a day (TID) | ORAL | Status: DC | PRN

## 2012-05-09 MED ORDER — METOPROLOL TARTRATE 50 MG PO TABS: 50.0000 mg | ORAL_TABLET | Freq: Three times a day (TID) | ORAL | Status: DC

## 2012-05-09 MED ORDER — FUROSEMIDE 40 MG PO TABS: 40.0000 mg | ORAL_TABLET | Freq: Two times a day (BID) | ORAL | Status: DC

## 2012-05-09 MED ORDER — LEVOTHYROXINE SODIUM 125 MCG PO TABS: 125.0000 ug | ORAL_TABLET | Freq: Every day | ORAL | Status: DC

## 2012-05-09 MED ORDER — LORAZEPAM 0.5 MG PO TABS: 0.5000 mg | ORAL_TABLET | Freq: Every day | ORAL | Status: DC

## 2012-05-09 NOTE — Assessment & Plan Note (Signed)
Continue with current prescription therapy as reflected on the Med list.  

## 2012-05-09 NOTE — Progress Notes (Signed)
   Subjective:    Patient ID: [name], female    DOB: [birth year], 66 y.o.   MRN: [id number]  HPI  The patient needs to address  chronic hypertension that has been well controlled with medicines; to address chronic  hyperlipidemia controlled with medicines as well; and to address anxiety, CAD, controlled with medical treatment and diet.  BP Readings from Last 3 Encounters:  05/09/12 120/70  01/15/12 154/82  09/11/11 168/90   Wt Readings from Last 3 Encounters:  05/09/12 165 lb (74.844 kg)  01/15/12 165 lb 8 oz (75.07 kg)  09/11/11 165 lb (74.844 kg)     Review of Systems  Constitutional: Negative for chills, activity change, appetite change, fatigue and unexpected weight change.  HENT: Negative for hearing loss, congestion, facial swelling, mouth sores, voice change and sinus pressure.   Eyes: Negative for visual disturbance.  Respiratory: Negative for cough and chest tightness.   Gastrointestinal: Negative for nausea, vomiting, abdominal pain, diarrhea and rectal pain.  Genitourinary: Negative for frequency, decreased urine volume, difficulty urinating, vaginal pain and pelvic pain.  Musculoskeletal: Positive for back pain and arthralgias. Negative for myalgias and gait problem.  Skin: Negative for pallor, rash and wound.  Neurological: Negative for dizziness, tremors, weakness, numbness and headaches.  Hematological: Negative for adenopathy.  Psychiatric/Behavioral: Negative for suicidal ideas, confusion and sleep disturbance. The patient is nervous/anxious.        Objective:   Physical Exam  Constitutional: She appears well-developed and well-nourished. No distress.  HENT:  Head: Normocephalic.  Right Ear: External ear normal.  Left Ear: External ear normal.  Nose: Nose normal.  Mouth/Throat: Oropharynx is clear and moist.  Eyes: Conjunctivae normal are normal. Pupils are equal, round, and reactive to light. Right eye exhibits no discharge. Left eye exhibits no  discharge.  Neck: Normal range of motion. Neck supple. No JVD present. No tracheal deviation present. No thyromegaly present.  Cardiovascular: Normal rate, regular rhythm and normal heart sounds.   Pulmonary/Chest: No stridor. No respiratory distress. She has no wheezes.  Abdominal: Soft. Bowel sounds are normal. She exhibits no distension and no mass. There is no tenderness. There is no rebound and no guarding.  Musculoskeletal: She exhibits no edema and no tenderness.  Lymphadenopathy:    She has no cervical adenopathy.  Neurological: She displays normal reflexes. No cranial nerve deficit. She exhibits normal muscle tone. Coordination normal.  Skin: No rash noted. No erythema.  Psychiatric: She has a normal mood and affect. Her behavior is normal. Judgment and thought content normal.    Lab Results  Component Value Date   WBC 5.6 06/11/2011   HGB 12.5 06/11/2011   HCT 36.1 06/11/2011   PLT 234.0 06/11/2011   GLUCOSE 98 05/02/2012   CHOL 264* 01/10/2012   TRIG 197.0* 01/10/2012   HDL 54.70 01/10/2012   LDLDIRECT 188.2 01/10/2012   LDLCALC 76 03/21/2009   ALT 13 01/10/2012   AST 20 01/10/2012   NA 137 05/02/2012   K 3.7 05/02/2012   CL 100 05/02/2012   CREATININE 1.0 05/02/2012   BUN 15 05/02/2012   CO2 30 05/02/2012   TSH 3.33 05/02/2012   INR 1.09 08/22/2009         Assessment & Plan:

## 2012-06-10 ENCOUNTER — Telehealth: Payer: Self-pay | Admitting: Cardiovascular Disease

## 2012-06-10 NOTE — Telephone Encounter (Signed)
I spoke with the pt and made her aware that Dr Posey Rea checked labs in August and December of 2013.  At this time the pt does not need labs drawn.

## 2012-06-10 NOTE — Telephone Encounter (Signed)
New problem:    Need lab work prior to upcoming appt .

## 2012-06-10 NOTE — Telephone Encounter (Signed)
Pt calling back to let us know dr plotnikov's office did some labs on 05-02-12, ok to leave message on pt's machine

## 2012-06-17 ENCOUNTER — Ambulatory Visit: Payer: Medicare Other | Admitting: Internal Medicine

## 2012-07-01 ENCOUNTER — Ambulatory Visit: Payer: Medicare Other | Admitting: Cardiovascular Disease

## 2012-07-03 ENCOUNTER — Ambulatory Visit: Payer: Medicare Other | Admitting: Cardiovascular Disease

## 2012-08-01 ENCOUNTER — Ambulatory Visit (INDEPENDENT_AMBULATORY_CARE_PROVIDER_SITE_OTHER): Payer: Medicare Other | Admitting: Cardiovascular Disease

## 2012-08-01 ENCOUNTER — Encounter: Payer: Self-pay | Admitting: Cardiovascular Disease

## 2012-08-01 VITALS — BP 138/88 | HR 70 | Ht 68.0 in | Wt 164.0 lb

## 2012-08-01 DIAGNOSIS — I2510 Atherosclerotic heart disease of native coronary artery without angina pectoris: Secondary | ICD-10-CM

## 2012-08-01 NOTE — Patient Instructions (Addendum)
Your physician has requested that you have a carotid duplex. This test is an ultrasound of the carotid arteries in your neck. It looks at blood flow through these arteries that supply the brain with blood. Allow one hour for this exam. There are no restrictions or special instructions.  Your physician wants you to follow-up in: 1 YEAR with Dr Cooper.  You will receive a reminder letter in the mail two months in advance. If you don't receive a letter, please call our office to schedule the follow-up appointment.  Your physician recommends that you continue on your current medications as directed. Please refer to the Current Medication list given to you today.   

## 2012-08-01 NOTE — Progress Notes (Signed)
HPI:  67 year old woman presenting for followup evaluation. The patient has hypertension, nonobstructive CAD, and hyperlipidemia. She had remote PCI of the LAD. She underwent cardiac catheterization in 2011 and this demonstrated wide patency of her stent sites with no other significant CAD. She is intolerant to all lipid-lowering therapies.  From a symptomatic perspective, she is doing fairly well. She denies chest pain or pressure. She has mild dyspnea with exertion. She denies orthopnea, PND, palpitations, or leg swelling. She has not been physically active over the past several months.  Outpatient Encounter Prescriptions as of 08/01/2012  Medication Sig Dispense Refill  . aspirin 81 MG EC tablet Take 81 mg by mouth daily.        . Cholecalciferol 1000 UNITS tablet Take 1,000 Units by mouth daily.        . clorazepate (TRANXENE) 15 MG tablet Take 1 tablet (15 mg total) by mouth 3 (three) times daily as needed for anxiety.  270 tablet  1  . doxepin (SINEQUAN) 50 MG capsule Take 3 capsules (150 mg total) by mouth at bedtime.  270 capsule  3  . estradiol cypionate (DEPO-ESTRADIOL) 5 MG/ML injection Inject 0.1 mLs (0.5 mg total) into the muscle as directed. Every 18-25 days  5 mL  3  . furosemide (LASIX) 40 MG tablet Take 1 tablet (40 mg total) by mouth 2 (two) times daily.  180 tablet  3  . levothyroxine (SYNTHROID, LEVOTHROID) 125 MCG tablet Take 1 tablet (125 mcg total) by mouth daily.  90 tablet  3  . LORazepam (ATIVAN) 0.5 MG tablet Take 1 tablet (0.5 mg total) by mouth at bedtime.  90 tablet  1  . metoprolol (LOPRESSOR) 50 MG tablet Take 1 tablet (50 mg total) by mouth 3 (three) times daily.  270 tablet  3  . NIFEdipine (PROCARDIA-XL/ADALAT-CC/NIFEDICAL-XL) 30 MG 24 hr tablet Take 1 tablet (30 mg total) by mouth daily.  90 tablet  3  . nitroGLYCERIN (NITROLINGUAL) 0.4 MG/SPRAY spray Place 1 spray under the tongue every 5 (five) minutes as needed for chest pain. For chest pain - may repeat x 3   16.9 g  3  . potassium chloride SA (K-DUR,KLOR-CON) 20 MEQ tablet Take 1 tablet (20 mEq total) by mouth 2 (two) times daily.  180 tablet  3  . [DISCONTINUED] traMADol (ULTRAM) 50 MG tablet Take 50-100 mg by mouth 2 (two) times daily as needed.        . [DISCONTINUED] triamcinolone (KENALOG) 0.5 % ointment Apply topically 2 (two) times daily as needed.         No facility-administered encounter medications on file as of 08/01/2012.    Allergies  Allergen Reactions  . Alprazolam     REACTION: very sedating  . Amoxicillin   . Atorvastatin     REACTION: weakness  . Ciprofloxacin   . Codeine   . Diphenhydramine Hcl   . Epinephrine   . Ezetimibe     REACTION: myalgia  . Isosorbide Mononitrate   . Lidocaine   . Morphine   . Propoxyphene-Acetaminophen   . Rosuvastatin     REACTION: leg weakness  . Simvastatin     REACTION: leg  weakness  . Tetracycline     Past Medical History  Diagnosis Date  . Bronchitis, acute   . Carotid bruit   . De Quervain's tenosynovitis   . Unspecified hypothyroidism   . Unspecified essential hypertension   . Other and unspecified hyperlipidemia   . Depressive disorder, not elsewhere  classified   . Coronary atherosclerosis of unspecified type of vessel, native or graft     multiple angioplasy procedures in 90's/early 2000's  . Anxiety state, unspecified   . Female bladder prolapse   . Lichen 2011    Vaginal   BP 138/88  Pulse 70  Ht 5\' 8"  (1.727 m)  Wt 74.39 kg (164 lb)  BMI 24.94 kg/m2  PHYSICAL EXAM: Pt is alert and oriented, very pleasant woman in NAD HEENT: normal Neck: JVP - normal, carotids 2+= with a soft left carotid bruit Lungs: CTA bilaterally CV: RRR without murmur or gallop Abd: soft, NT, Positive BS, no hepatomegaly Ext: no C/C/E, distal pulses intact and equal Skin: warm/dry no rash  EKG:  Normal sinus rhythm, within normal limits.  ASSESSMENT AND PLAN: 1. Coronary artery disease, native vessel. She's having no anginal  symptoms. Her last cardiac cath data was reviewed and demonstrated wide patency of her LAD stent site. She will continue on her current medical program which includes aspirin for antiplatelet therapy. She cannot tolerate lipid-lowering drugs.  2. Hypertension. Blood pressure is controlled on her current medical program. I encouraged her to work on increasing her exercise over the next few months.  3. Carotid atherosclerosis, nonobstructive. Last duplex scan was 2 years ago. She has a left carotid bruit. Will repeat.  Tonny Bollman 08/01/2012 5:54 PM

## 2012-08-21 ENCOUNTER — Encounter (INDEPENDENT_AMBULATORY_CARE_PROVIDER_SITE_OTHER): Payer: Medicare Other

## 2012-08-21 DIAGNOSIS — I6529 Occlusion and stenosis of unspecified carotid artery: Secondary | ICD-10-CM

## 2012-08-21 DIAGNOSIS — I2510 Atherosclerotic heart disease of native coronary artery without angina pectoris: Secondary | ICD-10-CM

## 2012-08-25 ENCOUNTER — Telehealth: Payer: Self-pay | Admitting: Cardiovascular Disease

## 2012-08-25 NOTE — Telephone Encounter (Signed)
Walk In pt Form " Jury Duty" Forms to Be Completed Sent to Lauren   08/25/12/KM

## 2012-08-27 ENCOUNTER — Telehealth: Payer: Self-pay | Admitting: Cardiovascular Disease

## 2012-08-27 NOTE — Telephone Encounter (Signed)
New problem     Pt calling to see if paperwork for jury duty is complete & calling to get test results

## 2012-08-27 NOTE — Telephone Encounter (Signed)
Provided pt with carotid doppler results.  Will forward to [name] to return call regarding jury duty paperwork.

## 2012-08-29 NOTE — Telephone Encounter (Signed)
I spoke with the [name] and made her aware that Dr Excell Seltzer cannot provide documentation to excuse her from jury duty.  The [name]'s jury summons will be placed at the front desk for pick-up.

## 2012-09-10 ENCOUNTER — Encounter: Payer: Self-pay | Admitting: Internal Medicine

## 2012-09-10 ENCOUNTER — Ambulatory Visit (INDEPENDENT_AMBULATORY_CARE_PROVIDER_SITE_OTHER): Payer: Medicare Other | Admitting: Internal Medicine

## 2012-09-10 ENCOUNTER — Other Ambulatory Visit (INDEPENDENT_AMBULATORY_CARE_PROVIDER_SITE_OTHER): Payer: Medicare Other

## 2012-09-10 VITALS — BP 160/90 | HR 80 | Temp 97.3°F | Resp 16 | Wt 165.0 lb

## 2012-09-10 DIAGNOSIS — I2510 Atherosclerotic heart disease of native coronary artery without angina pectoris: Secondary | ICD-10-CM

## 2012-09-10 DIAGNOSIS — N951 Menopausal and female climacteric states: Secondary | ICD-10-CM

## 2012-09-10 DIAGNOSIS — I10 Essential (primary) hypertension: Secondary | ICD-10-CM

## 2012-09-10 DIAGNOSIS — F329 Major depressive disorder, single episode, unspecified: Secondary | ICD-10-CM

## 2012-09-10 LAB — HEPATIC FUNCTION PANEL
ALT: 18 U/L (ref 0–35)
AST: 20 U/L (ref 0–37)
Albumin: 3.9 g/dL (ref 3.5–5.2)
Alkaline Phosphatase: 81 U/L (ref 39–117)
Bilirubin, Direct: 0 mg/dL (ref 0.0–0.3)
Total Bilirubin: 0.6 mg/dL (ref 0.3–1.2)
Total Protein: 7.3 g/dL (ref 6.0–8.3)

## 2012-09-10 LAB — BASIC METABOLIC PANEL
BUN: 19 mg/dL (ref 6–23)
CO2: 31 mEq/L (ref 19–32)
Calcium: 9.2 mg/dL (ref 8.4–10.5)
Chloride: 101 mEq/L (ref 96–112)
Creatinine, Ser: 1.2 mg/dL (ref 0.4–1.2)
GFR: 49.22 mL/min — ABNORMAL LOW (ref >60.00)
Glucose, Bld: 90 mg/dL (ref 70–99)
Potassium: 3.7 mEq/L (ref 3.5–5.1)
Sodium: 138 mEq/L (ref 135–145)

## 2012-09-10 MED ORDER — CLORAZEPATE DIPOTASSIUM 15 MG PO TABS: 15.0000 mg | ORAL_TABLET | Freq: Three times a day (TID) | ORAL | Status: DC | PRN

## 2012-09-10 MED ORDER — LORAZEPAM 0.5 MG PO TABS: 0.5000 mg | ORAL_TABLET | Freq: Every day | ORAL | Status: DC

## 2012-09-10 MED ORDER — MIRTAZAPINE 15 MG PO TABS: 15.0000 mg | ORAL_TABLET | Freq: Every day | ORAL | Status: DC

## 2012-09-10 NOTE — Assessment & Plan Note (Signed)
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 She would like to re-start Depo  Potential benefits of a long term HRT  use as well as potential risks  and complications were explained to the patient and were aknowledged. OK to re-start HRT shots

## 2012-09-10 NOTE — Progress Notes (Signed)
   Subjective:   HPI  The patient needs to address  chronic hypertension that has been well controlled with medicines; to address chronic  hyperlipidemia controlled with medicines as well; and to address anxiety, CAD, controlled with medical treatment and diet. C/o sweats x 3 mo since she stopped HRT C/o stress  BP Readings from Last 3 Encounters:  09/10/12 160/90  08/01/12 138/88  05/09/12 120/70   Wt Readings from Last 3 Encounters:  09/10/12 165 lb (74.844 kg)  08/01/12 164 lb (74.39 kg)  05/09/12 165 lb (74.844 kg)     Review of Systems  Constitutional: Negative for chills, activity change, appetite change, fatigue and unexpected weight change.  HENT: Negative for hearing loss, congestion, facial swelling, mouth sores, voice change and sinus pressure.   Eyes: Negative for visual disturbance.  Respiratory: Negative for cough and chest tightness.   Gastrointestinal: Negative for nausea, vomiting, abdominal pain, diarrhea and rectal pain.  Genitourinary: Negative for frequency, decreased urine volume, difficulty urinating, vaginal pain and pelvic pain.  Musculoskeletal: Positive for back pain and arthralgias. Negative for myalgias and gait problem.  Skin: Negative for pallor, rash and wound.  Neurological: Negative for dizziness, tremors, weakness, numbness and headaches.  Hematological: Negative for adenopathy.  Psychiatric/Behavioral: Negative for suicidal ideas, confusion and sleep disturbance. The patient is nervous/anxious.        Objective:   Physical Exam  Constitutional: She appears well-developed and well-nourished. No distress.  HENT:  Head: Normocephalic.  Right Ear: External ear normal.  Left Ear: External ear normal.  Nose: Nose normal.  Mouth/Throat: Oropharynx is clear and moist.  Eyes: Conjunctivae are normal. Pupils are equal, round, and reactive to light. Right eye exhibits no discharge. Left eye exhibits no discharge.  Neck: Normal range of motion.  Neck supple. No JVD present. No tracheal deviation present. No thyromegaly present.  Cardiovascular: Normal rate, regular rhythm and normal heart sounds.   Pulmonary/Chest: No stridor. No respiratory distress. She has no wheezes.  Abdominal: Soft. Bowel sounds are normal. She exhibits no distension and no mass. There is no tenderness. There is no rebound and no guarding.  Musculoskeletal: She exhibits no edema and no tenderness.  Lymphadenopathy:    She has no cervical adenopathy.  Neurological: She displays normal reflexes. No cranial nerve deficit. She exhibits normal muscle tone. Coordination normal.  Skin: No rash noted. No erythema.  Psychiatric: She has a normal mood and affect. Her behavior is normal. Judgment and thought content normal.    Lab Results  Component Value Date   WBC 5.6 06/11/2011   HGB 12.5 06/11/2011   HCT 36.1 06/11/2011   PLT 234.0 06/11/2011   GLUCOSE 98 05/02/2012   CHOL 264* 01/10/2012   TRIG 197.0* 01/10/2012   HDL 54.70 01/10/2012   LDLDIRECT 188.2 01/10/2012   LDLCALC 76 03/21/2009   ALT 13 01/10/2012   AST 20 01/10/2012   NA 137 05/02/2012   K 3.7 05/02/2012   CL 100 05/02/2012   CREATININE 1.0 05/02/2012   BUN 15 05/02/2012   CO2 30 05/02/2012   TSH 3.33 05/02/2012   INR 1.09 08/22/2009         Assessment & Plan:

## 2012-09-10 NOTE — Assessment & Plan Note (Signed)
Continue with current prescription therapy as reflected on the Med list.  

## 2012-09-10 NOTE — Assessment & Plan Note (Signed)
D/c Doxepine - not covered. Will try remeron

## 2012-11-07 ENCOUNTER — Telehealth: Payer: Self-pay | Admitting: Internal Medicine

## 2012-11-07 ENCOUNTER — Ambulatory Visit (INDEPENDENT_AMBULATORY_CARE_PROVIDER_SITE_OTHER): Payer: Medicare Other | Admitting: Internal Medicine

## 2012-11-07 ENCOUNTER — Encounter: Payer: Self-pay | Admitting: Internal Medicine

## 2012-11-07 VITALS — BP 120/82 | HR 77 | Temp 97.1°F | Wt 162.0 lb

## 2012-11-07 DIAGNOSIS — R1013 Epigastric pain: Secondary | ICD-10-CM

## 2012-11-07 DIAGNOSIS — K219 Gastro-esophageal reflux disease without esophagitis: Secondary | ICD-10-CM

## 2012-11-07 MED ORDER — OMEPRAZOLE 20 MG PO CPDR: 20.0000 mg | DELAYED_RELEASE_CAPSULE | Freq: Every day | ORAL | Status: DC

## 2012-11-07 NOTE — Progress Notes (Signed)
Subjective:    Patient ID: [name], female    DOB: 07/23/1945, 67 y.o.   MRN: [id number]  HPI  Pt presents to the clinic today with c/o abdominal pain. This started last Saturday night. It has been intermittent since that time. It seems to be worse after she eats. She has taken Maalox, which did seem to help. The pain is relieved by belching. She has never had any reflux in the past. She does have a remote history of a hiatal hernia. She is having normal BM;'s. She has not noticed any blood in her stool. She is under a lot of stress. She lives alone, has been out of work for 2 years. She is having trouble finding a job and her finances are starting to get low. She has a history of CAD with subsequent angina. She is managed with angina.  Review of Systems      Past Medical History  Diagnosis Date  . Bronchitis, acute   . Carotid bruit   . De Quervain's tenosynovitis   . Unspecified hypothyroidism   . Unspecified essential hypertension   . Other and unspecified hyperlipidemia   . Depressive disorder, not elsewhere classified   . Coronary atherosclerosis of unspecified type of vessel, native or graft     multiple angioplasy procedures in 90's/early 2000's  . Anxiety state, unspecified   . Female bladder prolapse   . Lichen 2011    Vaginal    Current Outpatient Prescriptions  Medication Sig Dispense Refill  . aspirin 81 MG EC tablet Take 81 mg by mouth daily.        . Cholecalciferol 1000 UNITS tablet Take 1,000 Units by mouth daily.        . clorazepate (TRANXENE) 15 MG tablet Take 1 tablet (15 mg total) by mouth 3 (three) times daily as needed for anxiety.  270 tablet  1  . estradiol cypionate (DEPO-ESTRADIOL) 5 MG/ML injection Inject 0.1 mLs (0.5 mg total) into the muscle as directed. Every 18-25 days  5 mL  3  . furosemide (LASIX) 40 MG tablet Take 1 tablet (40 mg total) by mouth 2 (two) times daily.  180 tablet  3  . levothyroxine (SYNTHROID, LEVOTHROID) 125 MCG tablet Take 1  tablet (125 mcg total) by mouth daily.  90 tablet  3  . LORazepam (ATIVAN) 0.5 MG tablet Take 1 tablet (0.5 mg total) by mouth at bedtime.  90 tablet  1  . metoprolol (LOPRESSOR) 50 MG tablet Take 1 tablet (50 mg total) by mouth 3 (three) times daily.  270 tablet  3  . mirtazapine (REMERON) 15 MG tablet Take 1 tablet (15 mg total) by mouth at bedtime.  90 tablet  1  . NIFEdipine (PROCARDIA-XL/ADALAT-CC/NIFEDICAL-XL) 30 MG 24 hr tablet Take 1 tablet (30 mg total) by mouth daily.  90 tablet  3  . nitroGLYCERIN (NITROLINGUAL) 0.4 MG/SPRAY spray Place 1 spray under the tongue every 5 (five) minutes as needed for chest pain. For chest pain - may repeat x 3  16.9 g  3  . potassium chloride SA (K-DUR,KLOR-CON) 20 MEQ tablet Take 1 tablet (20 mEq total) by mouth 2 (two) times daily.  180 tablet  3   No current facility-administered medications for this visit.    Allergies  Allergen Reactions  . Alprazolam     REACTION: very sedating  . Amoxicillin   . Atorvastatin     REACTION: weakness  . Ciprofloxacin   . Codeine   . Diphenhydramine  Hcl   . Epinephrine   . Ezetimibe     REACTION: myalgia  . Isosorbide Mononitrate   . Lidocaine   . Morphine   . Propoxyphene-Acetaminophen   . Rosuvastatin     REACTION: leg weakness  . Simvastatin     REACTION: leg  weakness  . Tetracycline     Family History  Problem Relation Age of Onset  . Hypertension Other   . Coronary artery disease Other   . Hypertension Mother   . Hypertension Father     History   Social History  . Marital Status: Widowed    Spouse Name: N/A    Number of Children: N/A  . Years of Education: N/A   Occupational History  . Not on file.   Social History Main Topics  . Smoking status: Former Smoker    Quit date: 07/22/1991  . Smokeless tobacco: Not on file  . Alcohol Use: No  . Drug Use: No  . Sexually Active: Not Currently   Other Topics Concern  . 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  Respiratory: Denies difficulty breathing, shortness of breath, cough or sputum production.   Cardiovascular: Denies chest pain, chest tightness, palpitations or swelling in the hands or feet.  Gastrointestinal: Pt reports abdominal pain. Denies bloating, constipation, diarrhea or blood in the stool.  Neurological: Denies dizziness, difficulty with memory, difficulty with speech or problems with balance and coordination.   No other specific complaints in a complete review of systems (except as listed in HPI above).   Objective:   Physical Exam   BP 120/82  Pulse 77  Temp(Src) 97.1 F (36.2 C) (Oral)  Wt 162 lb (73.483 kg)  BMI 24.64 kg/m2  SpO2 93% Wt Readings from Last 3 Encounters:  11/07/12 162 lb (73.483 kg)  09/10/12 165 lb (74.844 kg)  08/01/12 164 lb (74.39 kg)    General: Appears her stated age, well developed, well nourished in NAD, anxious appearing. Cardiovascular: Normal rate and rhythm. S1,S2 noted.  No murmur, rubs or gallops noted. No JVD or BLE edema. No carotid bruits noted. Pulmonary/Chest: Normal effort and positive vesicular breath sounds. No respiratory distress. No wheezes, rales or ronchi noted.  Abdomen: Soft and tender in the epigastric area. Normal bowel sounds, no bruits noted. No distention or masses noted. Liver, spleen and kidneys non palpable. Neurological: Alert and oriented. Cranial nerves II-XII intact. Coordination normal. +DTRs bilaterally.   BMET    Component Value Date/Time   NA 138 09/10/2012 1604   K 3.7 09/10/2012 1604   CL 101 09/10/2012 1604   CO2 31 09/10/2012 1604   GLUCOSE 90 09/10/2012 1604   BUN 19 09/10/2012 1604   CREATININE 1.2 09/10/2012 1604   CALCIUM 9.2 09/10/2012 1604   GFRNONAA 62.51 11/03/2009 1015   GFRAA  Value: >60        The eGFR has been calculated using the MDRD equation.  This calculation has not been validated in all clinical situations. eGFR's persistently <60 mL/min signify possible Chronic Kidney Disease. 08/23/2009 0350    Lipid Panel     Component Value Date/Time   CHOL 264* 01/10/2012 1135   TRIG 197.0* 01/10/2012 1135   HDL 54.70 01/10/2012 1135   CHOLHDL 5 01/10/2012 1135   VLDL 39.4 01/10/2012 1135   LDLCALC  76 03/21/2009 0848    CBC    Component Value Date/Time   WBC 5.6 06/11/2011 1057   RBC 3.92 06/11/2011 1057   HGB 12.5 06/11/2011 1057   HCT 36.1 06/11/2011 1057   PLT 234.0 06/11/2011 1057   MCV 92.2 06/11/2011 1057   MCHC 34.5 06/11/2011 1057   RDW 12.5 06/11/2011 1057   LYMPHSABS 1.7 06/11/2011 1057   MONOABS 0.4 06/11/2011 1057   EOSABS 0.3 06/11/2011 1057   BASOSABS 0.0 06/11/2011 1057    Hgb A1C No results found for this basename: HGBA1C        Assessment & Plan:

## 2012-11-07 NOTE — Patient Instructions (Signed)
Diet for Gastroesophageal Reflux Disease, Adult Reflux (acid reflux) is when acid from your stomach flows up into the esophagus. When acid comes in contact with the esophagus, the acid causes irritation and soreness (inflammation) in the esophagus. When reflux happens often or so severely that it causes damage to the esophagus, it is called gastroesophageal reflux disease (GERD). Nutrition therapy can help ease the discomfort of GERD. FOODS OR DRINKS TO AVOID OR LIMIT  Smoking or chewing tobacco. Nicotine is one of the most potent stimulants to acid production in the gastrointestinal tract.  Caffeinated and decaffeinated coffee and black tea.  Regular or low-calorie carbonated beverages or energy drinks (caffeine-free carbonated beverages are allowed).   Strong spices, such as black pepper, white pepper, red pepper, cayenne, curry powder, and chili powder.  Peppermint or spearmint.  Chocolate.  High-fat foods, including meats and fried foods. Extra added fats including oils, butter, salad dressings, and nuts. Limit these to less than 8 tsp per day.  Fruits and vegetables if they are not tolerated, such as citrus fruits or tomatoes.  Alcohol.  Any food that seems to aggravate your condition. If you have questions regarding your diet, call your caregiver or a registered dietitian. OTHER THINGS THAT MAY HELP GERD INCLUDE:   Eating your meals slowly, in a relaxed setting.  Eating 5 to 6 small meals per day instead of 3 large meals.  Eliminating food for a period of time if it causes distress.  Not lying down until 3 hours after eating a meal.  Keeping the head of your bed raised 6 to 9 inches (15 to 23 cm) by using a foam wedge or blocks under the legs of the bed. Lying flat may make symptoms worse.  Being physically active. Weight loss may be helpful in reducing reflux in overweight or obese adults.  Wear loose fitting clothing EXAMPLE MEAL PLAN This meal plan is approximately  2,000 calories based on ChooseMyPlate.gov meal planning guidelines. Breakfast   cup cooked oatmeal.  1 cup strawberries.  1 cup low-fat milk.  1 oz almonds. Snack  1 cup cucumber slices.  6 oz yogurt (made from low-fat or fat-free milk). Lunch  2 slice whole-wheat bread.  2 oz sliced turkey.  2 tsp mayonnaise.  1 cup blueberries.  1 cup snap peas. Snack  6 whole-wheat crackers.  1 oz string cheese. Dinner   cup brown rice.  1 cup mixed veggies.  1 tsp olive oil.  3 oz grilled fish. Document Released: 05/07/2005 Document Revised: 07/30/2011 Document Reviewed: 03/23/2011 Exit[employer] Patient Information 2014 [employer]. Gastroesophageal Reflux Disease, Adult Gastroesophageal reflux disease (GERD) happens when acid from your stomach flows up into the esophagus. When acid comes in contact with the esophagus, the acid causes soreness (inflammation) in the esophagus. Over time, GERD may create small holes (ulcers) in the lining of the esophagus. CAUSES   Increased body weight. This puts pressure on the stomach, making acid rise from the stomach into the esophagus.  Smoking. This increases acid production in the stomach.  Drinking alcohol. This causes decreased pressure in the lower esophageal sphincter (valve or ring of muscle between the esophagus and stomach), allowing acid from the stomach into the esophagus.  Late evening meals and a full stomach. This increases pressure and acid production in the stomach.  A malformed lower esophageal sphincter. Sometimes, no cause is found. SYMPTOMS   Burning pain in the lower part of the mid-chest behind the breastbone and in the mid-stomach area. This may   occur twice a week or more often.  Trouble swallowing.  Sore throat.  Dry cough.  Asthma-like symptoms including chest tightness, shortness of breath, or wheezing. DIAGNOSIS  Your caregiver may be able to diagnose GERD based on your symptoms. In some cases,  X-rays and other tests may be done to check for complications or to check the condition of your stomach and esophagus. TREATMENT  Your caregiver may recommend over-the-counter or prescription medicines to help decrease acid production. Ask your caregiver before starting or adding any new medicines.  HOME CARE INSTRUCTIONS   Change the factors that you can control. Ask your caregiver for guidance concerning weight loss, quitting smoking, and alcohol consumption.  Avoid foods and drinks that make your symptoms worse, such as:  Caffeine or alcoholic drinks.  Chocolate.  Peppermint or mint flavorings.  Garlic and onions.  Spicy foods.  Citrus fruits, such as oranges, lemons, or limes.  Tomato-based foods such as sauce, chili, salsa, and pizza.  Fried and fatty foods.  Avoid lying down for the 3 hours prior to your bedtime or prior to taking a nap.  Eat small, frequent meals instead of large meals.  Wear loose-fitting clothing. Do not wear anything tight around your waist that causes pressure on your stomach.  Raise the head of your bed 6 to 8 inches with wood blocks to help you sleep. Extra pillows will not help.  Only take over-the-counter or prescription medicines for pain, discomfort, or fever as directed by your caregiver.  Do not take aspirin, ibuprofen, or other nonsteroidal anti-inflammatory drugs (NSAIDs). SEEK IMMEDIATE MEDICAL CARE IF:   You have pain in your arms, neck, jaw, teeth, or back.  Your pain increases or changes in intensity or duration.  You develop nausea, vomiting, or sweating (diaphoresis).  You develop shortness of breath, or you faint.  Your vomit is green, yellow, black, or looks like coffee grounds or blood.  Your stool is red, bloody, or black. These symptoms could be signs of other problems, such as heart disease, gastric bleeding, or esophageal bleeding. MAKE SURE YOU:   Understand these instructions.  Will watch your  condition.  Will get help right away if you are not doing well or get worse. Document Released: 02/14/2005 Document Revised: 07/30/2011 Document Reviewed: 11/24/2010 ExitCare Patient Information 2014 [employer].  

## 2012-11-07 NOTE — Telephone Encounter (Signed)
Pt was seen today at 1:00.  She took a prilosec at 2:30 and then ate a sandwhich around 4:00.  She now has diarrhea and a stomach ache.  The stomach ache is better after going to the bathroom.

## 2012-11-07 NOTE — Telephone Encounter (Signed)
I also transferred her to Call A Nurse.

## 2012-11-07 NOTE — Assessment & Plan Note (Signed)
New onset, could be worse now secondary to stress eRx for prilosec  Monitor symptoms and let me know if persist or worsen

## 2012-11-11 ENCOUNTER — Telehealth: Payer: Self-pay | Admitting: Internal Medicine

## 2012-11-11 ENCOUNTER — Other Ambulatory Visit: Payer: Self-pay | Admitting: Internal Medicine

## 2012-11-11 MED ORDER — METOCLOPRAMIDE HCL 5 MG PO TABS: 5.0000 mg | ORAL_TABLET | Freq: Three times a day (TID) | ORAL | Status: DC

## 2012-11-11 NOTE — Telephone Encounter (Signed)
LMOM to see if Ms. Gough needs to come in before Monday.

## 2012-11-11 NOTE — Telephone Encounter (Signed)
Called in 5 mg TID

## 2012-11-11 NOTE — Telephone Encounter (Signed)
I called her. She doesn't remember if she took 5 mg or 10 mg.  She took it three times a day, 30 min before each meal. She uses [facility] on [address].

## 2012-11-11 NOTE — Telephone Encounter (Signed)
Pt stopped taking the Prilosec due to reactions to it.  She bought some Zantac to take. She took it years ago.  She used to take Reglan for acid reflux some years ago.  Could this be called in?  She has a follow up with Dr. Posey Rea on Monday.

## 2012-11-11 NOTE — Telephone Encounter (Signed)
Pt is aware to check with her pharmacy.

## 2012-11-11 NOTE — Telephone Encounter (Signed)
Ok to be called in, I need to know if she was taking 5 mg or 10 mg and how many times per day

## 2012-11-17 ENCOUNTER — Encounter: Payer: Self-pay | Admitting: Internal Medicine

## 2012-11-17 ENCOUNTER — Ambulatory Visit (INDEPENDENT_AMBULATORY_CARE_PROVIDER_SITE_OTHER): Payer: Medicare Other | Admitting: Internal Medicine

## 2012-11-17 VITALS — BP 120/82 | HR 74 | Temp 98.6°F | Resp 16 | Wt 161.0 lb

## 2012-11-17 DIAGNOSIS — K219 Gastro-esophageal reflux disease without esophagitis: Secondary | ICD-10-CM

## 2012-11-17 DIAGNOSIS — T50905D Adverse effect of unspecified drugs, medicaments and biological substances, subsequent encounter: Secondary | ICD-10-CM

## 2012-11-17 DIAGNOSIS — T50905A Adverse effect of unspecified drugs, medicaments and biological substances, initial encounter: Secondary | ICD-10-CM | POA: Insufficient documentation

## 2012-11-17 DIAGNOSIS — Z5189 Encounter for other specified aftercare: Secondary | ICD-10-CM

## 2012-11-17 MED ORDER — RANITIDINE HCL 150 MG PO TABS: 150.0000 mg | ORAL_TABLET | Freq: Two times a day (BID) | ORAL | Status: DC

## 2012-11-17 NOTE — Assessment & Plan Note (Signed)
6/14 due to Prilosec Resolved

## 2012-11-17 NOTE — Assessment & Plan Note (Signed)
Re-start Ranitidine

## 2012-11-17 NOTE — Progress Notes (Signed)
   Subjective:   HPI F/u severe GI reaction to Prilosec (resolved) and GERD/HH The patient needs to address  chronic hypertension that has been well controlled with medicines; to address chronic  hyperlipidemia controlled with medicines as well; and to address anxiety, CAD, controlled with medical treatment and diet. C/o sweats x 3 mo since she stopped HRT C/o stress  BP Readings from Last 3 Encounters:  11/17/12 120/82  11/07/12 120/82  09/10/12 160/90   Wt Readings from Last 3 Encounters:  11/17/12 161 lb (73.029 kg)  11/07/12 162 lb (73.483 kg)  09/10/12 165 lb (74.844 kg)     Review of Systems  Constitutional: Negative for chills, activity change, appetite change, fatigue and unexpected weight change.  HENT: Negative for hearing loss, congestion, facial swelling, mouth sores, voice change and sinus pressure.   Eyes: Negative for visual disturbance.  Respiratory: Negative for cough and chest tightness.   Gastrointestinal: Negative for nausea, vomiting, abdominal pain, diarrhea and rectal pain.  Genitourinary: Negative for frequency, decreased urine volume, difficulty urinating, vaginal pain and pelvic pain.  Musculoskeletal: Positive for back pain and arthralgias. Negative for myalgias and gait problem.  Skin: Negative for pallor, rash and wound.  Neurological: Negative for dizziness, tremors, weakness, numbness and headaches.  Hematological: Negative for adenopathy.  Psychiatric/Behavioral: Negative for suicidal ideas, confusion and sleep disturbance. The patient is nervous/anxious.        Objective:   Physical Exam  Constitutional: She appears well-developed and well-nourished. No distress.  HENT:  Head: Normocephalic.  Right Ear: External ear normal.  Left Ear: External ear normal.  Nose: Nose normal.  Mouth/Throat: Oropharynx is clear and moist.  Eyes: Conjunctivae are normal. Pupils are equal, round, and reactive to light. Right eye exhibits no discharge. Left eye  exhibits no discharge.  Neck: Normal range of motion. Neck supple. No JVD present. No tracheal deviation present. No thyromegaly present.  Cardiovascular: Normal rate, regular rhythm and normal heart sounds.   Pulmonary/Chest: No stridor. No respiratory distress. She has no wheezes.  Abdominal: Soft. Bowel sounds are normal. She exhibits no distension and no mass. There is no tenderness. There is no rebound and no guarding.  Musculoskeletal: She exhibits no edema and no tenderness.  Lymphadenopathy:    She has no cervical adenopathy.  Neurological: She displays normal reflexes. No cranial nerve deficit. She exhibits normal muscle tone. Coordination normal.  Skin: No rash noted. No erythema.  Psychiatric: She has a normal mood and affect. Her behavior is normal. Judgment and thought content normal.    Lab Results  Component Value Date   WBC 5.6 06/11/2011   HGB 12.5 06/11/2011   HCT 36.1 06/11/2011   PLT 234.0 06/11/2011   GLUCOSE 90 09/10/2012   CHOL 264* 01/10/2012   TRIG 197.0* 01/10/2012   HDL 54.70 01/10/2012   LDLDIRECT 188.2 01/10/2012   LDLCALC 76 03/21/2009   ALT 18 09/10/2012   AST 20 09/10/2012   NA 138 09/10/2012   K 3.7 09/10/2012   CL 101 09/10/2012   CREATININE 1.2 09/10/2012   BUN 19 09/10/2012   CO2 31 09/10/2012   TSH 3.33 05/02/2012   INR 1.09 08/22/2009         Assessment & Plan:

## 2012-12-01 ENCOUNTER — Telehealth: Payer: Self-pay | Admitting: *Deleted

## 2012-12-01 NOTE — Telephone Encounter (Signed)
Triage Record Num: [id number] Operator: Frederico Hamman Patient Name: [name] Call Date & Time: 11/07/2012 11:40:21PM Patient Phone: [phone] PCP: Sonda Primes Patient Gender: Female PCP Fax : [phone] Patient DOB: 08-18-1945 [facility] Name[facility] Reason for Call: Addendum to call 6/20/14Mora Bellman states she has a history of hiatal hernia which has not bothered her for years. States she has sensitivities to many medications. States she may hold Prilosec. States she gets anxious at times. Lives alone. States she is concerned about cardiac history and tends to stay in denial- hoping symptoms have a cause other than cardiac. office note Protocol(s) Used: Office Note Recommended Outcome per Protocol: Information Noted and Sent to Office Reason for Outcome: Caller information to office Care Advice: ~ 06/

## 2012-12-01 NOTE — Telephone Encounter (Signed)
Triage Record Num: [id number] Operator: Frederico Hamman Patient Name: [name] Call Date & Time: 11/07/2012 5:18:28PM Patient Phone: [phone] PCP: Sonda Primes Patient Gender: Female PCP Fax : [phone] Patient DOB: 08-02-1945 [facility] Name[facility] Reason for Call: Gita states she was seen in office on 11/07/12 due to substernal pressure and abdominal pain. Was diagnosed with acid reflux. Took Prilosec . Thinks she is having side effects from Prilosec. Short of breath at times. States she had a diarrhea x 2. Abdominal/Umbilical pain is subsiding since diarrhea. Having pressure in chest > 5 minutes within last hour. BP 137/77. Pulse 72. Per chest pain protocol has 911 disposition due to chest pain or pressure > 5 minutes. Declines to call 911. States she will monitor herself one hour. If no relief will go to ED or call 911. Advised to callback if needed. Care advice given. Protocol(s) Used: Chest Pain Recommended Outcome per Protocol: Activate EMS 911 Reason for Outcome: Pressure, fullness, squeezing sensation or pain anywhere in the chest lasting 5 or more minutes now or within the last hour. Pain is NOT associated with taking a deep breath or a productive cough, movement, or touch to a localized area on the chest. Care Advice: ~ IMMEDIATE ACTION ~ Place person in a position of comfort and loosen tight clothing. After calling EMS 911, have the person chew one aspirin tablet (325 mg), or 4 baby aspirin (81mg ) with a small amount of water now if conscious, not allergic to aspirin, or if has not been told to avoid taking aspirin by their provider. It is important to use aspirin, not acetaminophen. ~ Take nitroglycerin as directed if prescribed by your provider. Men should not take nitroglycerin within 36 hours of taking erectile dysfunction drugs unless directed to do so by their provider as it may cause a sudden drop in blood pressure. ~ 11/07/2012 6:27:50PM Page 1 of 1  [id number]

## 2013-01-13 ENCOUNTER — Ambulatory Visit: Payer: Medicare Other | Admitting: Internal Medicine

## 2013-01-16 ENCOUNTER — Ambulatory Visit: Payer: Medicare Other | Admitting: Internal Medicine

## 2013-01-20 ENCOUNTER — Ambulatory Visit: Payer: Medicare Other | Admitting: Internal Medicine

## 2013-01-21 ENCOUNTER — Ambulatory Visit (INDEPENDENT_AMBULATORY_CARE_PROVIDER_SITE_OTHER): Payer: Medicare Other | Admitting: Internal Medicine

## 2013-01-21 ENCOUNTER — Encounter: Payer: Self-pay | Admitting: Internal Medicine

## 2013-01-21 VITALS — BP 160/92 | HR 76 | Temp 97.8°F | Resp 16 | Wt 164.0 lb

## 2013-01-21 DIAGNOSIS — K219 Gastro-esophageal reflux disease without esophagitis: Secondary | ICD-10-CM

## 2013-01-21 DIAGNOSIS — I2510 Atherosclerotic heart disease of native coronary artery without angina pectoris: Secondary | ICD-10-CM

## 2013-01-21 DIAGNOSIS — I10 Essential (primary) hypertension: Secondary | ICD-10-CM

## 2013-01-21 MED ORDER — NIFEDIPINE ER OSMOTIC RELEASE 30 MG PO TB24: 30.0000 mg | ORAL_TABLET | Freq: Every day | ORAL | Status: DC

## 2013-01-21 MED ORDER — LEVOTHYROXINE SODIUM 125 MCG PO TABS: 125.0000 ug | ORAL_TABLET | Freq: Every day | ORAL | Status: DC

## 2013-01-21 MED ORDER — NITROGLYCERIN 0.4 MG/SPRAY TL SOLN: 1.0000 | Status: DC | PRN

## 2013-01-21 MED ORDER — POTASSIUM CHLORIDE CRYS ER 20 MEQ PO TBCR: 20.0000 meq | EXTENDED_RELEASE_TABLET | Freq: Two times a day (BID) | ORAL | Status: DC

## 2013-01-21 MED ORDER — MIRTAZAPINE 15 MG PO TABS: 15.0000 mg | ORAL_TABLET | Freq: Every day | ORAL | Status: DC

## 2013-01-21 MED ORDER — LORAZEPAM 0.5 MG PO TABS: 0.5000 mg | ORAL_TABLET | Freq: Every day | ORAL | Status: DC

## 2013-01-21 MED ORDER — DOXEPIN HCL 50 MG PO CAPS: 150.0000 mg | ORAL_CAPSULE | Freq: Every day | ORAL | Status: DC

## 2013-01-21 MED ORDER — METOPROLOL TARTRATE 50 MG PO TABS: 50.0000 mg | ORAL_TABLET | Freq: Three times a day (TID) | ORAL | Status: DC

## 2013-01-21 MED ORDER — FUROSEMIDE 40 MG PO TABS: 40.0000 mg | ORAL_TABLET | Freq: Two times a day (BID) | ORAL | Status: DC

## 2013-01-21 MED ORDER — ESTRADIOL CYPIONATE 5 MG/ML IM OIL: 0.2500 mg | TOPICAL_OIL | INTRAMUSCULAR | Status: DC

## 2013-01-21 MED ORDER — CLORAZEPATE DIPOTASSIUM 15 MG PO TABS: 15.0000 mg | ORAL_TABLET | Freq: Three times a day (TID) | ORAL | Status: DC | PRN

## 2013-01-21 NOTE — Assessment & Plan Note (Signed)
Continue with current prescription therapy as reflected on the Med list.  

## 2013-01-21 NOTE — Assessment & Plan Note (Signed)
Better Use Zantac prn

## 2013-01-21 NOTE — Patient Instructions (Signed)
Gluten free trial (no wheat products) for 4-6 weeks. OK to use gluten-free bread and gluten-free pasta.  Milk free trial (no milk, ice cream, cheese and yogurt) for 4-6 weeks. OK to use almond, coconut, rice or soy milk.  

## 2013-01-21 NOTE — Progress Notes (Signed)
Patient ID: [name], female   DOB: [birth year], 67 y.o.   MRN: [id number]   Subjective:   HPI F/u severe GI reaction to Prilosec (resolved) and GERD/HH The patient needs to address  chronic hypertension that has been well controlled with medicines; to address chronic  hyperlipidemia controlled with medicines as well; and to address anxiety, CAD, controlled with medical treatment and diet. C/o sweats x 3 mo since she stopped HRT C/o stress  BP Readings from Last 3 Encounters:  01/21/13 160/92  11/17/12 120/82  11/07/12 120/82   Wt Readings from Last 3 Encounters:  01/21/13 164 lb (74.39 kg)  11/17/12 161 lb (73.029 kg)  11/07/12 162 lb (73.483 kg)     Review of Systems  Constitutional: Negative for chills, activity change, appetite change, fatigue and unexpected weight change.  HENT: Negative for hearing loss, congestion, facial swelling, mouth sores, voice change and sinus pressure.   Eyes: Negative for visual disturbance.  Respiratory: Negative for cough and chest tightness.   Gastrointestinal: Negative for nausea, vomiting, abdominal pain, diarrhea and rectal pain.  Genitourinary: Negative for frequency, decreased urine volume, difficulty urinating, vaginal pain and pelvic pain.  Musculoskeletal: Positive for back pain and arthralgias. Negative for myalgias and gait problem.  Skin: Negative for pallor, rash and wound.  Neurological: Negative for dizziness, tremors, weakness, numbness and headaches.  Hematological: Negative for adenopathy.  Psychiatric/Behavioral: Negative for suicidal ideas, confusion and sleep disturbance. The patient is nervous/anxious.        Objective:   Physical Exam  Constitutional: She appears well-developed and well-nourished. No distress.  HENT:  Head: Normocephalic.  Right Ear: External ear normal.  Left Ear: External ear normal.  Nose: Nose normal.  Mouth/Throat: Oropharynx is clear and moist.  Eyes: Conjunctivae are normal. Pupils are  equal, round, and reactive to light. Right eye exhibits no discharge. Left eye exhibits no discharge.  Neck: Normal range of motion. Neck supple. No JVD present. No tracheal deviation present. No thyromegaly present.  Cardiovascular: Normal rate, regular rhythm and normal heart sounds.   Pulmonary/Chest: No stridor. No respiratory distress. She has no wheezes.  Abdominal: Soft. Bowel sounds are normal. She exhibits no distension and no mass. There is no tenderness. There is no rebound and no guarding.  Musculoskeletal: She exhibits no edema and no tenderness.  Lymphadenopathy:    She has no cervical adenopathy.  Neurological: She displays normal reflexes. No cranial nerve deficit. She exhibits normal muscle tone. Coordination normal.  Skin: No rash noted. No erythema.  Psychiatric: She has a normal mood and affect. Her behavior is normal. Judgment and thought content normal.    Lab Results  Component Value Date   WBC 5.6 06/11/2011   HGB 12.5 06/11/2011   HCT 36.1 06/11/2011   PLT 234.0 06/11/2011   GLUCOSE 90 09/10/2012   CHOL 264* 01/10/2012   TRIG 197.0* 01/10/2012   HDL 54.70 01/10/2012   LDLDIRECT 188.2 01/10/2012   LDLCALC 76 03/21/2009   ALT 18 09/10/2012   AST 20 09/10/2012   NA 138 09/10/2012   K 3.7 09/10/2012   CL 101 09/10/2012   CREATININE 1.2 09/10/2012   BUN 19 09/10/2012   CO2 31 09/10/2012   TSH 3.33 05/02/2012   INR 1.09 08/22/2009         Assessment & Plan:

## 2013-01-22 ENCOUNTER — Encounter: Payer: Self-pay | Admitting: Internal Medicine

## 2013-02-11 ENCOUNTER — Ambulatory Visit (INDEPENDENT_AMBULATORY_CARE_PROVIDER_SITE_OTHER): Payer: Medicare Other | Admitting: Internal Medicine

## 2013-02-11 ENCOUNTER — Encounter: Payer: Self-pay | Admitting: Internal Medicine

## 2013-02-11 VITALS — BP 120/80 | HR 73 | Temp 97.1°F | Wt 165.4 lb

## 2013-02-11 DIAGNOSIS — F329 Major depressive disorder, single episode, unspecified: Secondary | ICD-10-CM

## 2013-02-11 DIAGNOSIS — M25569 Pain in unspecified knee: Secondary | ICD-10-CM

## 2013-02-11 DIAGNOSIS — I10 Essential (primary) hypertension: Secondary | ICD-10-CM

## 2013-02-11 DIAGNOSIS — M25562 Pain in left knee: Secondary | ICD-10-CM

## 2013-02-11 DIAGNOSIS — F3289 Other specified depressive episodes: Secondary | ICD-10-CM

## 2013-02-11 NOTE — Progress Notes (Signed)
Subjective:    Patient ID: [name], female    DOB: 1945-08-30, 67 y.o.   MRN: [id number]  HPI here with acute complaint, 3 days ago was coming out of a bldng, stepped off the sidewalk and apparently tripped somehow, fell forward, struck left knee and LUE primarily, did not hit head, no LOC, bled quite a bit from the abrasion left ant knee, takes ASA, applied pressure for 40 min and improved, went home, applied peroxide to wound which really hurt but seemed to clean well, small skin flap reattached, applied gauze, since then has had aching pain despite the neosporin and bandaid, held on taking the asa for 2 days, today overall pain improved but still aches more than she expected, today mod to severe. Denies worsening depressive symptoms, suicidal ideation, or panic;  Pt denies chest pain, increased sob or doe, wheezing, orthopnea, PND, increased LE swelling, palpitations, dizziness or syncope. Past Medical History  Diagnosis Date  . Bronchitis, acute   . Carotid bruit   . De Quervain's tenosynovitis   . Unspecified hypothyroidism   . Unspecified essential hypertension   . Other and unspecified hyperlipidemia   . Depressive disorder, not elsewhere classified   . Coronary atherosclerosis of unspecified type of vessel, native or graft     multiple angioplasy procedures in 90's/early 2000's  . Anxiety state, unspecified   . Female bladder prolapse   . Lichen 2011    Vaginal   No past surgical history on file.  reports that she quit smoking about 21 years ago. She does not have any smokeless tobacco history on file. She reports that she does not drink alcohol or use illicit drugs. family history includes Coronary artery disease in her other; Hypertension in her father, mother, and other. Allergies  Allergen Reactions  . Alprazolam     REACTION: very sedating  . Amoxicillin   . Atorvastatin     REACTION: weakness  . Ciprofloxacin   . Codeine   . Diphenhydramine Hcl   . Epinephrine   .  Ezetimibe     REACTION: myalgia  . Isosorbide Mononitrate   . Lidocaine   . Morphine   . Prilosec [Omeprazole] Diarrhea    Chest discomfort  . Propoxyphene-Acetaminophen   . Rosuvastatin     REACTION: leg weakness  . Simvastatin     REACTION: leg  weakness  . Tetracycline    Current Outpatient Prescriptions on File Prior to Visit  Medication Sig Dispense Refill  . aspirin 81 MG EC tablet Take 81 mg by mouth daily.        . Cholecalciferol 1000 UNITS tablet Take 1,000 Units by mouth daily.        . clorazepate (TRANXENE) 15 MG tablet Take 1 tablet (15 mg total) by mouth 3 (three) times daily as needed for anxiety.  270 tablet  1  . doxepin (SINEQUAN) 50 MG capsule Take 3 capsules (150 mg total) by mouth at bedtime. 3 tabs qhs  270 capsule  3  . estradiol cypionate (DEPO-ESTRADIOL) 5 MG/ML injection Inject 0.1 mLs (0.5 mg total) into the muscle as directed. Every 18-25 days  5 mL  3  . furosemide (LASIX) 40 MG tablet Take 1 tablet (40 mg total) by mouth 2 (two) times daily.  180 tablet  3  . levothyroxine (SYNTHROID, LEVOTHROID) 125 MCG tablet Take 1 tablet (125 mcg total) by mouth daily.  90 tablet  3  . LORazepam (ATIVAN) 0.5 MG tablet Take 1 tablet (0.5 mg  total) by mouth at bedtime.  90 tablet  1  . metoCLOPramide (REGLAN) 5 MG tablet Take 1 tablet (5 mg total) by mouth 3 (three) times daily.  90 tablet  0  . metoprolol (LOPRESSOR) 50 MG tablet Take 1 tablet (50 mg total) by mouth 3 (three) times daily.  270 tablet  3  . mirtazapine (REMERON) 15 MG tablet Take 1 tablet (15 mg total) by mouth at bedtime.  90 tablet  1  . NIFEdipine (PROCARDIA-XL/ADALAT-CC/NIFEDICAL-XL) 30 MG 24 hr tablet Take 1 tablet (30 mg total) by mouth daily.  90 tablet  3  . nitroGLYCERIN (NITROLINGUAL) 0.4 MG/SPRAY spray Place 1 spray under the tongue every 5 (five) minutes as needed for chest pain. For chest pain - may repeat x 3  16.9 g  3  . potassium chloride SA (K-DUR,KLOR-CON) 20 MEQ tablet Take 1 tablet  (20 mEq total) by mouth 2 (two) times daily.  180 tablet  3  . ranitidine (ZANTAC) 150 MG tablet Take 1 tablet (150 mg total) by mouth 2 (two) times daily.  60 tablet  11   No current facility-administered medications on file prior to visit.   Review of Systems  Constitutional: Negative for unexpected weight change, or unusual diaphoresis  HENT: Negative for tinnitus.   Eyes: Negative for photophobia and visual disturbance.  Respiratory: Negative for choking and stridor.   Gastrointestinal: Negative for vomiting and blood in stool.  Genitourinary: Negative for hematuria and decreased urine volume.  Musculoskeletal: Negative for acute joint swelling except for the above Skin: Negative for color change and wound.  Neurological: Negative for tremors and numbness other than noted  Psychiatric/Behavioral: Negative for decreased concentration or  hyperactivity.       Objective:   Physical Exam BP 120/80  Pulse 73  Temp(Src) 97.1 F (36.2 C) (Oral)  Wt 165 lb 6 oz (75.014 kg)  BMI 25.15 kg/m2  SpO2 96% VS noted,  Constitutional: Pt appears well-developed and well-nourished.  HENT: Head: NCAT.  Right Ear: External ear normal.  Left Ear: External ear normal.  Eyes: Conjunctivae and EOM are normal. Pupils are equal, round, and reactive to light.  Neck: Normal range of motion. Neck supple.  Cardiovascular: Normal rate and regular rhythm.   Pulmonary/Chest: Effort normal and breath sounds normal.  Left knee with 2 cm abrasion, small skin flap adherent, without worsening red/swelling, o/w knee with FROM, small crepitus, no effusion Neurological: Pt is alert. Not confused  Skin: Skin is warm. No erythema.  Psychiatric: Pt behavior is normal. Thought content normal.     Assessment & Plan:

## 2013-02-11 NOTE — Patient Instructions (Signed)
Please continue your treatment as you have been doing, with water cleansing and sitz baths twice per day, neosporin and bandaid for the abrasion We can hold on antibiotic as there does not seem to be infection at this time You may have an underlying patellar (knee cap) fracture, or at the least a traumatic arthritis, so please use the tramadol that you have at home for pain, and: Please go to the XRAY Department in the Basement (go straight as you get off the elevator) for the x-ray testing tomorrow  You will be contacted by phone if any changes need to be made immediately.  Otherwise, you will receive a letter about your results with an explanation, but please check with MyChart first.  Please remember to sign up for My Chart if you have not done so, as this will be important to you in the future with finding out test results, communicating by private email, and scheduling acute appointments online when needed.

## 2013-02-12 ENCOUNTER — Ambulatory Visit (INDEPENDENT_AMBULATORY_CARE_PROVIDER_SITE_OTHER)
Admission: RE | Admit: 2013-02-12 | Discharge: 2013-02-12 | Disposition: A | Discharge status: Home / Self Care | Payer: Medicare Other | Source: Ambulatory Visit | Attending: Internal Medicine | Admitting: Internal Medicine

## 2013-02-12 ENCOUNTER — Encounter: Payer: Self-pay | Admitting: Internal Medicine

## 2013-02-12 DIAGNOSIS — M25569 Pain in unspecified knee: Secondary | ICD-10-CM

## 2013-02-12 DIAGNOSIS — M25562 Pain in left knee: Secondary | ICD-10-CM

## 2013-02-15 DIAGNOSIS — M25562 Pain in left knee: Secondary | ICD-10-CM | POA: Insufficient documentation

## 2013-02-15 NOTE — Assessment & Plan Note (Signed)
S/p fall with abrasion, and pain more than expected persistent, for film today, pain control, cont neosporin and wound care,  to f/u any worsening symptoms or concerns

## 2013-02-15 NOTE — Assessment & Plan Note (Signed)
stable overall by history and exam, recent data reviewed with pt, and pt to continue medical treatment as before,  to f/u any worsening symptoms or concerns BP Readings from Last 3 Encounters:  02/11/13 120/80  01/21/13 160/92  11/17/12 120/82

## 2013-02-15 NOTE — Assessment & Plan Note (Signed)
stable overall by history and exam, recent data reviewed with pt, and pt to continue medical treatment as before,  to f/u any worsening symptoms or concerns Lab Results  Component Value Date   WBC 5.6 06/11/2011   HGB 12.5 06/11/2011   HCT 36.1 06/11/2011   PLT 234.0 06/11/2011   GLUCOSE 90 09/10/2012   CHOL 264* 01/10/2012   TRIG 197.0* 01/10/2012   HDL 54.70 01/10/2012   LDLDIRECT 188.2 01/10/2012   LDLCALC 76 03/21/2009   ALT 18 09/10/2012   AST 20 09/10/2012   NA 138 09/10/2012   K 3.7 09/10/2012   CL 101 09/10/2012   CREATININE 1.2 09/10/2012   BUN 19 09/10/2012   CO2 31 09/10/2012   TSH 3.33 05/02/2012   INR 1.09 08/22/2009

## 2013-02-17 ENCOUNTER — Encounter: Payer: Self-pay | Admitting: Internal Medicine

## 2013-02-17 ENCOUNTER — Ambulatory Visit (INDEPENDENT_AMBULATORY_CARE_PROVIDER_SITE_OTHER): Payer: Medicare Other | Admitting: Internal Medicine

## 2013-02-17 VITALS — BP 110/70 | HR 63 | Temp 96.4°F | Wt 164.0 lb

## 2013-02-17 DIAGNOSIS — Z5189 Encounter for other specified aftercare: Secondary | ICD-10-CM

## 2013-02-17 DIAGNOSIS — S80212A Abrasion, left knee, initial encounter: Secondary | ICD-10-CM | POA: Insufficient documentation

## 2013-02-17 DIAGNOSIS — S80212D Abrasion, left knee, subsequent encounter: Secondary | ICD-10-CM

## 2013-02-17 MED ORDER — MUPIROCIN 2 % EX OINT: TOPICAL_OINTMENT | CUTANEOUS | Status: DC

## 2013-02-17 NOTE — Progress Notes (Signed)
   Subjective:   HPI F/u severe GI reaction to Prilosec (resolved) and GERD/HH The patient needs to address  chronic hypertension that has been well controlled with medicines; to address chronic  hyperlipidemia controlled with medicines as well; and to address anxiety, CAD, controlled with medical treatment and diet. C/o sweats x 3 mo since she stopped HRT C/o stress  BP Readings from Last 3 Encounters:  02/17/13 110/70  02/11/13 120/80  01/21/13 160/92   Wt Readings from Last 3 Encounters:  02/17/13 164 lb (74.39 kg)  02/11/13 165 lb 6 oz (75.014 kg)  01/21/13 164 lb (74.39 kg)     Review of Systems  Constitutional: Negative for chills, activity change, appetite change, fatigue and unexpected weight change.  HENT: Negative for hearing loss, congestion, facial swelling, mouth sores, voice change and sinus pressure.   Eyes: Negative for visual disturbance.  Respiratory: Negative for cough and chest tightness.   Gastrointestinal: Negative for nausea, vomiting, abdominal pain, diarrhea and rectal pain.  Genitourinary: Negative for frequency, decreased urine volume, difficulty urinating, vaginal pain and pelvic pain.  Musculoskeletal: Positive for back pain and arthralgias. Negative for myalgias and gait problem.  Skin: Negative for pallor, rash and wound.  Neurological: Negative for dizziness, tremors, weakness, numbness and headaches.  Hematological: Negative for adenopathy.  Psychiatric/Behavioral: Negative for suicidal ideas, confusion and sleep disturbance. The patient is nervous/anxious.        Objective:   Physical Exam  Constitutional: She appears well-developed and well-nourished. No distress.  HENT:  Head: Normocephalic.  Right Ear: External ear normal.  Left Ear: External ear normal.  Nose: Nose normal.  Mouth/Throat: Oropharynx is clear and moist.  Eyes: Conjunctivae are normal. Pupils are equal, round, and reactive to light. Right eye exhibits no discharge. Left  eye exhibits no discharge.  Neck: Normal range of motion. Neck supple. No JVD present. No tracheal deviation present. No thyromegaly present.  Cardiovascular: Normal rate, regular rhythm and normal heart sounds.   Pulmonary/Chest: No stridor. No respiratory distress. She has no wheezes.  Abdominal: Soft. Bowel sounds are normal. She exhibits no distension and no mass. There is no tenderness. There is no rebound and no guarding.  Musculoskeletal: She exhibits no edema and no tenderness.  Lymphadenopathy:    She has no cervical adenopathy.  Neurological: She displays normal reflexes. No cranial nerve deficit. She exhibits normal muscle tone. Coordination normal.  Skin: No rash noted. No erythema.  Psychiatric: She has a normal mood and affect. Her behavior is normal. Judgment and thought content normal.    Lab Results  Component Value Date   WBC 5.6 06/11/2011   HGB 12.5 06/11/2011   HCT 36.1 06/11/2011   PLT 234.0 06/11/2011   GLUCOSE 90 09/10/2012   CHOL 264* 01/10/2012   TRIG 197.0* 01/10/2012   HDL 54.70 01/10/2012   LDLDIRECT 188.2 01/10/2012   LDLCALC 76 03/21/2009   ALT 18 09/10/2012   AST 20 09/10/2012   NA 138 09/10/2012   K 3.7 09/10/2012   CL 101 09/10/2012   CREATININE 1.2 09/10/2012   BUN 19 09/10/2012   CO2 31 09/10/2012   TSH 3.33 05/02/2012   INR 1.09 08/22/2009         Assessment & Plan:

## 2013-02-17 NOTE — Assessment & Plan Note (Signed)
D/c neosporin -?allergic Start Bactroban for dressing change

## 2013-02-18 ENCOUNTER — Encounter: Payer: Self-pay | Admitting: Internal Medicine

## 2013-02-20 ENCOUNTER — Encounter: Payer: Self-pay | Admitting: Internal Medicine

## 2013-05-07 ENCOUNTER — Telehealth: Payer: Self-pay | Admitting: *Deleted

## 2013-05-07 NOTE — Telephone Encounter (Signed)
Ok Thx 

## 2013-05-07 NOTE — Telephone Encounter (Signed)
I called [employer] to initate PA for Depo Estradiol. Form is being faxed to [place] for MD completion.

## 2013-05-08 ENCOUNTER — Encounter: Payer: Self-pay | Admitting: Internal Medicine

## 2013-05-08 NOTE — Telephone Encounter (Signed)
Completed PA form faxed

## 2013-05-12 ENCOUNTER — Telehealth: Payer: Self-pay

## 2013-05-12 NOTE — Telephone Encounter (Signed)
Received a call from [facility] with [facility] [phone]. She states patient's Depo Estradiol has been approved effective 05/07/13 for 1 year. A letter has been sent and patient has been notified.

## 2013-05-19 ENCOUNTER — Ambulatory Visit: Payer: Medicare Other | Admitting: Internal Medicine

## 2013-05-26 ENCOUNTER — Ambulatory Visit: Payer: Medicare Other | Admitting: Internal Medicine

## 2013-06-12 ENCOUNTER — Ambulatory Visit: Payer: Medicare Other | Admitting: Internal Medicine

## 2013-07-08 ENCOUNTER — Telehealth: Payer: Self-pay | Admitting: *Deleted

## 2013-07-08 NOTE — Telephone Encounter (Signed)
Doxepin PA is approved effective 06/30/13 for 1 year.

## 2013-07-15 ENCOUNTER — Ambulatory Visit: Payer: Medicare Other | Admitting: Internal Medicine

## 2013-07-29 ENCOUNTER — Ambulatory Visit: Payer: Medicare Other | Admitting: Internal Medicine

## 2013-07-31 ENCOUNTER — Encounter: Payer: Self-pay | Admitting: Internal Medicine

## 2013-08-07 ENCOUNTER — Encounter: Payer: Self-pay | Admitting: Internal Medicine

## 2013-08-07 ENCOUNTER — Ambulatory Visit (INDEPENDENT_AMBULATORY_CARE_PROVIDER_SITE_OTHER): Payer: Medicare Other | Admitting: Internal Medicine

## 2013-08-07 VITALS — BP 158/78 | HR 72 | Temp 98.0°F | Resp 16 | Wt 166.0 lb

## 2013-08-07 DIAGNOSIS — F411 Generalized anxiety disorder: Secondary | ICD-10-CM

## 2013-08-07 DIAGNOSIS — E039 Hypothyroidism, unspecified: Secondary | ICD-10-CM

## 2013-08-07 DIAGNOSIS — F3289 Other specified depressive episodes: Secondary | ICD-10-CM

## 2013-08-07 DIAGNOSIS — F329 Major depressive disorder, single episode, unspecified: Secondary | ICD-10-CM

## 2013-08-07 DIAGNOSIS — I2510 Atherosclerotic heart disease of native coronary artery without angina pectoris: Secondary | ICD-10-CM

## 2013-08-07 DIAGNOSIS — I10 Essential (primary) hypertension: Secondary | ICD-10-CM

## 2013-08-07 MED ORDER — METOPROLOL TARTRATE 50 MG PO TABS: 50.0000 mg | ORAL_TABLET | Freq: Three times a day (TID) | ORAL | Status: DC

## 2013-08-07 MED ORDER — LEVOTHYROXINE SODIUM 125 MCG PO TABS: 125.0000 ug | ORAL_TABLET | Freq: Every day | ORAL | Status: DC

## 2013-08-07 MED ORDER — DOXEPIN HCL 50 MG PO CAPS: 150.0000 mg | ORAL_CAPSULE | Freq: Every day | ORAL | Status: DC

## 2013-08-07 MED ORDER — NITROGLYCERIN 0.4 MG/SPRAY TL SOLN: 1.0000 | Status: DC | PRN

## 2013-08-07 MED ORDER — CLORAZEPATE DIPOTASSIUM 15 MG PO TABS: 15.0000 mg | ORAL_TABLET | Freq: Three times a day (TID) | ORAL | Status: DC | PRN

## 2013-08-07 MED ORDER — NIFEDIPINE ER OSMOTIC RELEASE 30 MG PO TB24
30.0000 mg | ORAL_TABLET | Freq: Every day | ORAL | Status: DC
Start: 2013-08-07 — End: 2014-08-03

## 2013-08-07 MED ORDER — LORAZEPAM 0.5 MG PO TABS: 0.5000 mg | ORAL_TABLET | Freq: Every day | ORAL | Status: DC

## 2013-08-07 MED ORDER — FUROSEMIDE 40 MG PO TABS: 40.0000 mg | ORAL_TABLET | Freq: Two times a day (BID) | ORAL | Status: DC

## 2013-08-07 NOTE — Assessment & Plan Note (Signed)
Continue with current prescription therapy as reflected on the Med list.  

## 2013-08-07 NOTE — Progress Notes (Signed)
   Subjective:   HPI  The patient needs to address  chronic hypertension that has been well controlled with medicines; to address chronic  hyperlipidemia controlled with medicines as well; and to address anxiety, CAD, controlled with medical treatment and diet. 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  HENT: Negative for congestion, facial swelling, hearing loss, mouth sores, sinus pressure and voice change.   Eyes: Negative for visual disturbance.  Respiratory: Negative for cough and chest tightness.   Gastrointestinal: Negative for nausea, vomiting, abdominal pain, diarrhea and rectal pain.  Genitourinary: Negative for frequency, decreased urine volume, difficulty urinating, vaginal pain and pelvic pain.  Musculoskeletal: Positive for arthralgias and back pain. Negative for gait problem and myalgias.  Skin: Negative for pallor, rash and wound.  Neurological: Negative for dizziness, tremors, weakness, numbness and headaches.  Hematological: Negative for adenopathy.  Psychiatric/Behavioral: Negative for suicidal ideas, confusion and sleep disturbance. The patient is nervous/anxious.        Objective:   Physical Exam  Constitutional: She appears well-developed and well-nourished. No distress.  HENT:  Head: Normocephalic.  Right Ear: External ear normal.  Left Ear: External ear normal.  Nose: Nose normal.  Mouth/Throat: Oropharynx is clear and moist.  Eyes: Conjunctivae are normal. Pupils are equal, round, and reactive to light. Right eye exhibits no discharge. Left  eye exhibits no discharge.  Neck: Normal range of motion. Neck supple. No JVD present. No tracheal deviation present. No thyromegaly present.  Cardiovascular: Normal rate, regular rhythm and normal heart sounds.   Pulmonary/Chest: No stridor. No respiratory distress. She has no wheezes.  Abdominal: Soft. Bowel sounds are normal. She exhibits no distension and no mass. There is no tenderness. There is no rebound and no guarding.  Musculoskeletal: She exhibits no edema and no tenderness.  Lymphadenopathy:    She has no cervical adenopathy.  Neurological: She displays normal reflexes. No cranial nerve deficit. She exhibits normal muscle tone. Coordination normal.  Skin: No rash noted. No erythema.  Psychiatric: She has a normal mood and affect. Her behavior is normal. Judgment and thought content normal.    Lab Results  Component Value Date   WBC 5.6 06/11/2011   HGB 12.5 06/11/2011   HCT 36.1 06/11/2011   PLT 234.0 06/11/2011   GLUCOSE 90 09/10/2012   CHOL 264* 01/10/2012   TRIG 197.0* 01/10/2012   HDL 54.70 01/10/2012   LDLDIRECT 188.2 01/10/2012   LDLCALC 76 03/21/2009   ALT 18 09/10/2012   AST 20 09/10/2012   NA 138 09/10/2012   K 3.7 09/10/2012   CL 101 09/10/2012   CREATININE 1.2 09/10/2012   BUN 19 09/10/2012   CO2 31 09/10/2012   TSH 3.33 05/02/2012   INR 1.09 08/22/2009         Assessment & Plan:

## 2013-08-07 NOTE — Assessment & Plan Note (Signed)
Continue with current prn prescription therapy as reflected on the Med list.  

## 2013-08-07 NOTE — Progress Notes (Signed)
Pre visit review using our clinic review tool, if applicable. No additional management support is needed unless otherwise documented below in the visit note. 

## 2013-08-10 ENCOUNTER — Telehealth: Payer: Self-pay | Admitting: Cardiovascular Disease

## 2013-08-10 ENCOUNTER — Telehealth: Payer: Self-pay | Admitting: Internal Medicine

## 2013-08-10 NOTE — Telephone Encounter (Signed)
Relevant patient education assigned to patient using Emmi. ° °

## 2013-08-10 NOTE — Telephone Encounter (Signed)
I marked items as postponed/declined per pt.

## 2013-08-11 ENCOUNTER — Ambulatory Visit (INDEPENDENT_AMBULATORY_CARE_PROVIDER_SITE_OTHER): Payer: Medicare Other | Admitting: Cardiovascular Disease

## 2013-08-11 ENCOUNTER — Encounter: Payer: Self-pay | Admitting: Cardiovascular Disease

## 2013-08-11 VITALS — BP 140/80 | HR 64 | Ht 68.0 in | Wt 167.0 lb

## 2013-08-11 DIAGNOSIS — I2510 Atherosclerotic heart disease of native coronary artery without angina pectoris: Secondary | ICD-10-CM

## 2013-08-11 DIAGNOSIS — I10 Essential (primary) hypertension: Secondary | ICD-10-CM

## 2013-08-11 DIAGNOSIS — R0989 Other specified symptoms and signs involving the circulatory and respiratory systems: Secondary | ICD-10-CM

## 2013-08-11 DIAGNOSIS — E785 Hyperlipidemia, unspecified: Secondary | ICD-10-CM

## 2013-08-11 NOTE — Patient Instructions (Signed)
Your physician recommends that you continue on your current medications as directed. Please refer to the Current Medication list given to you today.  Your physician has requested that you have a carotid duplex in 1 year before your appointment with Dr. Excell Seltzerooper. This test is an ultrasound of the carotid arteries in your neck. It looks at blood flow through these arteries that supply the brain with blood. Allow one hour for this exam. There are no restrictions or special instructions.  Your physician wants you to follow-up in: 1 year with Dr. Excell Seltzerooper.  You will receive a reminder letter in the mail two months in advance. If you don't receive a letter, please call our office to schedule the follow-up appointment.

## 2013-08-12 ENCOUNTER — Other Ambulatory Visit (INDEPENDENT_AMBULATORY_CARE_PROVIDER_SITE_OTHER): Payer: Medicare Other

## 2013-08-12 DIAGNOSIS — E039 Hypothyroidism, unspecified: Secondary | ICD-10-CM

## 2013-08-12 DIAGNOSIS — F411 Generalized anxiety disorder: Secondary | ICD-10-CM

## 2013-08-12 DIAGNOSIS — I10 Essential (primary) hypertension: Secondary | ICD-10-CM

## 2013-08-12 DIAGNOSIS — F3289 Other specified depressive episodes: Secondary | ICD-10-CM

## 2013-08-12 DIAGNOSIS — I2510 Atherosclerotic heart disease of native coronary artery without angina pectoris: Secondary | ICD-10-CM

## 2013-08-12 DIAGNOSIS — F329 Major depressive disorder, single episode, unspecified: Secondary | ICD-10-CM

## 2013-08-12 LAB — URINALYSIS
Bilirubin Urine: NEGATIVE
Hgb urine dipstick: NEGATIVE
Ketones, ur: NEGATIVE
Leukocytes, UA: NEGATIVE
Nitrite: NEGATIVE
Specific Gravity, Urine: 1.01 (ref 1.000–1.030)
Total Protein, Urine: NEGATIVE
Urine Glucose: NEGATIVE
Urobilinogen, UA: 0.2 (ref 0.0–1.0)
pH: 7 (ref 5.0–8.0)

## 2013-08-12 LAB — CBC WITH DIFFERENTIAL/PLATELET
Basophils Absolute: 0 10*3/uL (ref 0.0–0.1)
Basophils Relative: 0.4 % (ref 0.0–3.0)
Eosinophils Absolute: 0.2 10*3/uL (ref 0.0–0.7)
Eosinophils Relative: 3.1 % (ref 0.0–5.0)
HCT: 38.4 % (ref 36.0–46.0)
Hemoglobin: 12.9 g/dL (ref 12.0–15.0)
Lymphocytes Relative: 34.4 % (ref 12.0–46.0)
Lymphs Abs: 2.4 10*3/uL (ref 0.7–4.0)
MCHC: 33.6 g/dL (ref 30.0–36.0)
MCV: 93.8 fl (ref 78.0–100.0)
Monocytes Absolute: 0.5 10*3/uL (ref 0.1–1.0)
Monocytes Relative: 6.7 % (ref 3.0–12.0)
Neutro Abs: 3.9 10*3/uL (ref 1.4–7.7)
Neutrophils Relative %: 55.4 % (ref 43.0–77.0)
Platelets: 286 10*3/uL (ref 150.0–400.0)
RBC: 4.09 Mil/uL (ref 3.87–5.11)
RDW: 12.8 % (ref 11.5–14.6)
WBC: 7 10*3/uL (ref 4.5–10.5)

## 2013-08-13 ENCOUNTER — Encounter: Payer: Self-pay | Admitting: Cardiovascular Disease

## 2013-08-13 LAB — BASIC METABOLIC PANEL
BUN: 16 mg/dL (ref 6–23)
CO2: 30 mEq/L (ref 19–32)
Calcium: 8.7 mg/dL (ref 8.4–10.5)
Chloride: 103 mEq/L (ref 96–112)
Creatinine, Ser: 1 mg/dL (ref 0.4–1.2)
GFR: 58.94 mL/min — ABNORMAL LOW (ref >60.00)
Glucose, Bld: 87 mg/dL (ref 70–99)
Potassium: 3.4 mEq/L — ABNORMAL LOW (ref 3.5–5.1)
Sodium: 140 mEq/L (ref 135–145)

## 2013-08-13 LAB — HEPATIC FUNCTION PANEL
ALT: 18 U/L (ref 0–35)
AST: 28 U/L (ref 0–37)
Albumin: 4.3 g/dL (ref 3.5–5.2)
Alkaline Phosphatase: 97 U/L (ref 39–117)
Bilirubin, Direct: 0 mg/dL (ref 0.0–0.3)
Total Bilirubin: 0.3 mg/dL (ref 0.3–1.2)
Total Protein: 7.4 g/dL (ref 6.0–8.3)

## 2013-08-13 LAB — NMR LIPOPROFILE WITHOUT LIPIDS
HDL Particle Number: 36.2 umol/L (ref >=30.5)
HDL Size: 9.2 nm (ref >=9.2)
LDL Particle Number: 2233 nmol/L — ABNORMAL HIGH (ref <1000)
LDL Size: 21.2 nm (ref >20.5)
LP-IR Score: 60 — ABNORMAL HIGH (ref <=45)
Large HDL-P: 8.1 umol/L (ref >=4.8)
Large VLDL-P: 7.9 nmol/L — ABNORMAL HIGH (ref <=2.7)
Small LDL Particle Number: 743 nmol/L — ABNORMAL HIGH (ref <=527)
VLDL Size: 62.5 nm — ABNORMAL HIGH (ref <=46.6)

## 2013-08-13 LAB — TSH: TSH: 0.89 u[IU]/mL (ref 0.35–5.50)

## 2013-08-13 NOTE — Progress Notes (Signed)
HPI:  68 year old woman presenting for followup evaluation. The patient has hypertension, nonobstructive CAD, and hyperlipidemia. She had remote PCI of the LAD. She underwent cardiac catheterization in 2011 and this demonstrated wide patency of her stent sites with no other significant CAD. She is intolerant to all lipid-lowering therapies.  She recently started a new job and has been excited about this opportunity. However, she's been working long hours and hopes to slow down to 40 hours/week. She feels well other than generalized fatigue but happy to 'have purpose.' No chest pain, dyspnea, or palpitations.   Outpatient Encounter Prescriptions as of 08/11/2013  Medication Sig  . aspirin 81 MG EC tablet Take 81 mg by mouth daily.    . Cholecalciferol 1000 UNITS tablet Take 1,000 Units by mouth daily.    . clorazepate (TRANXENE) 15 MG tablet Take 1 tablet (15 mg total) by mouth 3 (three) times daily as needed for anxiety.  Marland Kitchen. doxepin (SINEQUAN) 50 MG capsule Take 3 capsules (150 mg total) by mouth at bedtime. 3 tabs qhs  . estradiol cypionate (DEPO-ESTRADIOL) 5 MG/ML injection Inject 0.1 mLs (0.5 mg total) into the muscle as directed. Every 18-25 days  . furosemide (LASIX) 40 MG tablet Take 1 tablet (40 mg total) by mouth 2 (two) times daily.  Marland Kitchen. levothyroxine (SYNTHROID, LEVOTHROID) 125 MCG tablet Take 1 tablet (125 mcg total) by mouth daily.  Marland Kitchen. LORazepam (ATIVAN) 0.5 MG tablet Take 1 tablet (0.5 mg total) by mouth at bedtime.  . metoprolol (LOPRESSOR) 50 MG tablet Take 1 tablet (50 mg total) by mouth 3 (three) times daily.  Marland Kitchen. NIFEdipine (PROCARDIA-XL/ADALAT-CC/NIFEDICAL-XL) 30 MG 24 hr tablet Take 1 tablet (30 mg total) by mouth daily.  . nitroGLYCERIN (NITROLINGUAL) 0.4 MG/SPRAY spray Place 1 spray under the tongue every 5 (five) minutes as needed for chest pain. For chest pain - may repeat x 3  . potassium chloride SA (K-DUR,KLOR-CON) 20 MEQ tablet Take 1 tablet (20 mEq total) by mouth 2  (two) times daily.  . ranitidine (ZANTAC) 150 MG tablet Take 150 mg by mouth as needed.  . [DISCONTINUED] ranitidine (ZANTAC) 150 MG tablet Take 1 tablet (150 mg total) by mouth 2 (two) times daily.  . [DISCONTINUED] metoCLOPramide (REGLAN) 5 MG tablet Take 1 tablet (5 mg total) by mouth 3 (three) times daily.  . [DISCONTINUED] mirtazapine (REMERON) 15 MG tablet Take 1 tablet (15 mg total) by mouth at bedtime.  . [DISCONTINUED] mupirocin ointment (BACTROBAN) 2 % Use qd- bid    Allergies  Allergen Reactions  . Alprazolam     REACTION: very sedating  . Amoxicillin   . Atorvastatin     REACTION: weakness  . Ciprofloxacin   . Codeine   . Diphenhydramine Hcl   . Epinephrine   . Ezetimibe     REACTION: myalgia  . Isosorbide Mononitrate   . Lidocaine   . Morphine   . Prilosec [Omeprazole] Diarrhea    Chest discomfort  . Propoxyphene N-Acetaminophen   . Rosuvastatin     REACTION: leg weakness  . Simvastatin     REACTION: leg  weakness  . Tetracycline     Past Medical History  Diagnosis Date  . Bronchitis, acute   . Carotid bruit   . De Quervain's tenosynovitis   . Unspecified hypothyroidism   . Unspecified essential hypertension   . Other and unspecified hyperlipidemia   . Depressive disorder, not elsewhere classified   . Coronary atherosclerosis of unspecified type of vessel, native or graft  multiple angioplasy procedures in 90's/early 2000's  . Anxiety state, unspecified   . Female bladder prolapse   . Lichen 2011    Vaginal    ROS: Negative except as per HPI  BP 140/80  Pulse 64  Ht 5\' 8"  (1.727 m)  Wt 75.751 kg (167 lb)  BMI 25.40 kg/m2  PHYSICAL EXAM: Pt is alert and oriented, NAD HEENT: normal Neck: JVP - normal, carotids 2+= with a left carotid bruit Lungs: CTA bilaterally CV: RRR without murmur or gallop Abd: soft, NT, Positive BS, no hepatomegaly Ext: no C/C/E, distal pulses intact and equal Skin: warm/dry no rash  EKG:  NSR with short PR, no  significant ST-T changes  ASSESSMENT AND PLAN: 1. Coronary artery disease, native vessel. She's having no anginal symptoms. Her last cardiac cath data was reviewed and demonstrated wide patency of her LAD stent site. She will continue on her current medical program which includes aspirin for antiplatelet therapy. She cannot tolerate lipid-lowering drugs.   2. Hypertension. Blood pressure is controlled on her current medical program.   3. Carotid atherosclerosis, nonobstructive. Duplex one year ago showed subclavian stenosis but no significant ICA stenosis. Will repeat prior to her next OV in one year.   Tonny Bollman 08/13/2013 12:17 AM

## 2013-08-15 ENCOUNTER — Encounter: Payer: Self-pay | Admitting: Internal Medicine

## 2013-12-08 ENCOUNTER — Encounter: Payer: Medicare Other | Admitting: Internal Medicine

## 2014-02-02 ENCOUNTER — Ambulatory Visit (INDEPENDENT_AMBULATORY_CARE_PROVIDER_SITE_OTHER): Payer: Medicare Other | Admitting: Internal Medicine

## 2014-02-02 ENCOUNTER — Encounter: Payer: Self-pay | Admitting: Internal Medicine

## 2014-02-02 ENCOUNTER — Other Ambulatory Visit (INDEPENDENT_AMBULATORY_CARE_PROVIDER_SITE_OTHER): Payer: Medicare Other

## 2014-02-02 VITALS — BP 148/88 | HR 80 | Temp 98.4°F | Resp 16 | Wt 165.0 lb

## 2014-02-02 DIAGNOSIS — I10 Essential (primary) hypertension: Secondary | ICD-10-CM

## 2014-02-02 DIAGNOSIS — Z0000 Encounter for general adult medical examination without abnormal findings: Secondary | ICD-10-CM

## 2014-02-02 LAB — BASIC METABOLIC PANEL
BUN: 14 mg/dL (ref 6–23)
CO2: 31 mEq/L (ref 19–32)
Calcium: 9 mg/dL (ref 8.4–10.5)
Chloride: 102 mEq/L (ref 96–112)
Creatinine, Ser: 1.1 mg/dL (ref 0.4–1.2)
GFR: 50.02 mL/min — ABNORMAL LOW (ref >60.00)
Glucose, Bld: 92 mg/dL (ref 70–99)
Potassium: 4.5 mEq/L (ref 3.5–5.1)
Sodium: 138 mEq/L (ref 135–145)

## 2014-02-02 MED ORDER — LORAZEPAM 0.5 MG PO TABS: 0.5000 mg | ORAL_TABLET | Freq: Every day | ORAL | Status: DC

## 2014-02-02 MED ORDER — CLORAZEPATE DIPOTASSIUM 15 MG PO TABS: 15.0000 mg | ORAL_TABLET | Freq: Three times a day (TID) | ORAL | Status: DC | PRN

## 2014-02-02 NOTE — Progress Notes (Signed)
Pre visit review using our clinic review tool, if applicable. No additional management support is needed unless otherwise documented below in the visit note. 

## 2014-02-02 NOTE — Progress Notes (Signed)
   Subjective:   HPI  The patient is here for a wellness exam.   The patient needs to address  chronic hypertension that has been well controlled with medicines; to address chronic  hyperlipidemia controlled with medicines as well; and to address anxiety, CAD, controlled with medical treatment and diet.   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  HENT: Negative for congestion, facial swelling, hearing loss, mouth sores, sinus pressure and voice change.   Eyes: Negative for visual disturbance.  Respiratory: Negative for cough and chest tightness.   Gastrointestinal: Negative for nausea, vomiting, abdominal pain, diarrhea and rectal pain.  Genitourinary: Negative for frequency, decreased urine volume, difficulty urinating, vaginal pain and pelvic pain.  Musculoskeletal: Positive for arthralgias and back pain. Negative for gait problem and myalgias.  Skin: Negative for pallor, rash and wound.  Neurological: Negative for dizziness, tremors, weakness, numbness and headaches.  Hematological: Negative for adenopathy.  Psychiatric/Behavioral: Negative for suicidal ideas, confusion and sleep disturbance. The patient is nervous/anxious.        Objective:   Physical Exam  Constitutional: She appears well-developed and well-nourished. No distress.  HENT:  Head: Normocephalic.  Right Ear: External ear normal.  Left Ear: External ear normal.  Nose: Nose normal.  Mouth/Throat: Oropharynx is clear and moist.  Eyes: Conjunctivae are normal. Pupils are equal, round, and reactive to light. Right eye exhibits no discharge. Left  eye exhibits no discharge.  Neck: Normal range of motion. Neck supple. No JVD present. No tracheal deviation present. No thyromegaly present.  Cardiovascular: Normal rate, regular rhythm and normal heart sounds.   Pulmonary/Chest: No stridor. No respiratory distress. She has no wheezes.  Abdominal: Soft. Bowel sounds are normal. She exhibits no distension and no mass. There is no tenderness. There is no rebound and no guarding.  Musculoskeletal: She exhibits no edema and no tenderness.  Lymphadenopathy:    She has no cervical adenopathy.  Neurological: She displays normal reflexes. No cranial nerve deficit. She exhibits normal muscle tone. Coordination normal.  Skin: No rash noted. No erythema.  Psychiatric: She has a normal mood and affect. Her behavior is normal. Judgment and thought content normal.    Lab Results  Component Value Date   WBC 7.0 08/12/2013   HGB 12.9 08/12/2013   HCT 38.4 08/12/2013   PLT 286.0 08/12/2013   GLUCOSE 87 08/12/2013   CHOL 264* 01/10/2012   TRIG 197.0* 01/10/2012   HDL 54.70 01/10/2012   LDLDIRECT 188.2 01/10/2012   LDLCALC 76 03/21/2009   ALT 18 08/12/2013   AST 28 08/12/2013   NA 140 08/12/2013   K 3.4* 08/12/2013   CL 103 08/12/2013   CREATININE 1.0 08/12/2013   BUN 16 08/12/2013   CO2 30 08/12/2013   TSH 0.89 08/12/2013   INR 1.09 08/22/2009         Assessment & Plan:

## 2014-02-02 NOTE — Patient Instructions (Signed)
Preventive Care for Adults A healthy lifestyle and preventive care can promote health and wellness. Preventive health guidelines for women include the following key practices.  A routine yearly physical is a good way to check with your health care provider about your health and preventive screening. It is a chance to share any concerns and updates on your health and to receive a thorough exam.  Visit your dentist for a routine exam and preventive care every 6 months. Brush your teeth twice a day and floss once a day. Good oral hygiene prevents tooth decay and gum disease.  The frequency of eye exams is based on your age, health, family medical history, use of contact lenses, and other factors. Follow your health care provider's recommendations for frequency of eye exams.  Eat a healthy diet. Foods like vegetables, fruits, whole grains, low-fat dairy products, and lean protein foods contain the nutrients you need without too many calories. Decrease your intake of foods high in solid fats, added sugars, and salt. Eat the right amount of calories for you.Get information about a proper diet from your health care provider, if necessary.  Regular physical exercise is one of the most important things you can do for your health. Most adults should get at least 150 minutes of moderate-intensity exercise (any activity that increases your heart rate and causes you to sweat) each week. In addition, most adults need muscle-strengthening exercises on 2 or more days a week.  Maintain a healthy weight. The body mass index (BMI) is a screening tool to identify possible weight problems. It provides an estimate of body fat based on height and weight. Your health care provider can find your BMI and can help you achieve or maintain a healthy weight.For adults 20 years and older:  A BMI below 18.5 is considered underweight.  A BMI of 18.5 to 24.9 is normal.  A BMI of 25 to 29.9 is considered overweight.  A BMI of  30 and above is considered obese.  Maintain normal blood lipids and cholesterol levels by exercising and minimizing your intake of saturated fat. Eat a balanced diet with plenty of fruit and vegetables. Blood tests for lipids and cholesterol should begin at age 76 and be repeated every 5 years. If your lipid or cholesterol levels are high, you are over 50, or you are at high risk for heart disease, you may need your cholesterol levels checked more frequently.Ongoing high lipid and cholesterol levels should be treated with medicines if diet and exercise are not working.  If you smoke, find out from your health care provider how to quit. If you do not use tobacco, do not start.  Lung cancer screening is recommended for adults aged 22-80 years who are at high risk for developing lung cancer because of a history of smoking. A yearly low-dose CT scan of the lungs is recommended for people who have at least a 30-pack-year history of smoking and are a current smoker or have quit within the past 15 years. A pack year of smoking is smoking an average of 1 pack of cigarettes a day for 1 year (for example: 1 pack a day for 30 years or 2 packs a day for 15 years). Yearly screening should continue until the smoker has stopped smoking for at least 15 years. Yearly screening should be stopped for people who develop a health problem that would prevent them from having lung cancer treatment.  If you are pregnant, do not drink alcohol. If you are breastfeeding,  be very cautious about drinking alcohol. If you are not pregnant and choose to drink alcohol, do not have more than 1 drink per day. One drink is considered to be 12 ounces (355 mL) of beer, 5 ounces (148 mL) of wine, or 1.5 ounces (44 mL) of liquor.  Avoid use of street drugs. Do not share needles with anyone. Ask for help if you need support or instructions about stopping the use of drugs.  High blood pressure causes heart disease and increases the risk of  stroke. Your blood pressure should be checked at least every 1 to 2 years. Ongoing high blood pressure should be treated with medicines if weight loss and exercise do not work.  If you are 3-86 years old, ask your health care provider if you should take aspirin to prevent strokes.  Diabetes screening involves taking a blood sample to check your fasting blood sugar level. This should be done once every 3 years, after age 67, if you are within normal weight and without risk factors for diabetes. Testing should be considered at a younger age or be carried out more frequently if you are overweight and have at least 1 risk factor for diabetes.  Breast cancer screening is essential preventive care for women. You should practice "breast self-awareness." This means understanding the normal appearance and feel of your breasts and may include breast self-examination. Any changes detected, no matter how small, should be reported to a health care provider. Women in their 8s and 30s should have a clinical breast exam (CBE) by a health care provider as part of a regular health exam every 1 to 3 years. After age 70, women should have a CBE every year. Starting at age 25, women should consider having a mammogram (breast X-ray test) every year. Women who have a family history of breast cancer should talk to their health care provider about genetic screening. Women at a high risk of breast cancer should talk to their health care providers about having an MRI and a mammogram every year.  Breast cancer gene (BRCA)-related cancer risk assessment is recommended for women who have family members with BRCA-related cancers. BRCA-related cancers include breast, ovarian, tubal, and peritoneal cancers. Having family members with these cancers may be associated with an increased risk for harmful changes (mutations) in the breast cancer genes BRCA1 and BRCA2. Results of the assessment will determine the need for genetic counseling and  BRCA1 and BRCA2 testing.  Routine pelvic exams to screen for cancer are no longer recommended for nonpregnant women who are considered low risk for cancer of the pelvic organs (ovaries, uterus, and vagina) and who do not have symptoms. Ask your health care provider if a screening pelvic exam is right for you.  If you have had past treatment for cervical cancer or a condition that could lead to cancer, you need Pap tests and screening for cancer for at least 20 years after your treatment. If Pap tests have been discontinued, your risk factors (such as having a new sexual partner) need to be reassessed to determine if screening should be resumed. Some women have medical problems that increase the chance of getting cervical cancer. In these cases, your health care provider may recommend more frequent screening and Pap tests.  The HPV test is an additional test that may be used for cervical cancer screening. The HPV test looks for the virus that can cause the cell changes on the cervix. The cells collected during the Pap test can be  tested for HPV. The HPV test could be used to screen women aged 30 years and older, and should be used in women of any age who have unclear Pap test results. After the age of 30, women should have HPV testing at the same frequency as a Pap test.  Colorectal cancer can be detected and often prevented. Most routine colorectal cancer screening begins at the age of 50 years and continues through age 75 years. However, your health care provider may recommend screening at an earlier age if you have risk factors for colon cancer. On a yearly basis, your health care provider may provide home test kits to check for hidden blood in the stool. Use of a small camera at the end of a tube, to directly examine the colon (sigmoidoscopy or colonoscopy), can detect the earliest forms of colorectal cancer. Talk to your health care provider about this at age 50, when routine screening begins. Direct  exam of the colon should be repeated every 5-10 years through age 75 years, unless early forms of pre-cancerous polyps or small growths are found.  People who are at an increased risk for hepatitis B should be screened for this virus. You are considered at high risk for hepatitis B if:  You were born in a country where hepatitis B occurs often. Talk with your health care provider about which countries are considered high risk.  Your parents were born in a high-risk country and you have not received a shot to protect against hepatitis B (hepatitis B vaccine).  You have HIV or AIDS.  You use needles to inject street drugs.  You live with, or have sex with, someone who has hepatitis B.  You get hemodialysis treatment.  You take certain medicines for conditions like cancer, organ transplantation, and autoimmune conditions.  Hepatitis C blood testing is recommended for all people born from 1945 through 1965 and any individual with known risks for hepatitis C.  Practice safe sex. Use condoms and avoid high-risk sexual practices to reduce the spread of sexually transmitted infections (STIs). STIs include gonorrhea, chlamydia, syphilis, trichomonas, herpes, HPV, and human immunodeficiency virus (HIV). Herpes, HIV, and HPV are viral illnesses that have no cure. They can result in disability, cancer, and death.  You should be screened for sexually transmitted illnesses (STIs) including gonorrhea and chlamydia if:  You are sexually active and are younger than 24 years.  You are older than 24 years and your health care provider tells you that you are at risk for this type of infection.  Your sexual activity has changed since you were last screened and you are at an increased risk for chlamydia or gonorrhea. Ask your health care provider if you are at risk.  If you are at risk of being infected with HIV, it is recommended that you take a prescription medicine daily to prevent HIV infection. This is  called preexposure prophylaxis (PrEP). You are considered at risk if:  You are a heterosexual woman, are sexually active, and are at increased risk for HIV infection.  You take drugs by injection.  You are sexually active with a partner who has HIV.  Talk with your health care provider about whether you are at high risk of being infected with HIV. If you choose to begin PrEP, you should first be tested for HIV. You should then be tested every 3 months for as long as you are taking PrEP.  Osteoporosis is a disease in which the bones lose minerals and strength   with aging. This can result in serious bone fractures or breaks. The risk of osteoporosis can be identified using a bone density scan. Women ages 65 years and over and women at risk for fractures or osteoporosis should discuss screening with their health care providers. Ask your health care provider whether you should take a calcium supplement or vitamin D to reduce the rate of osteoporosis.  Menopause can be associated with physical symptoms and risks. Hormone replacement therapy is available to decrease symptoms and risks. You should talk to your health care provider about whether hormone replacement therapy is right for you.  Use sunscreen. Apply sunscreen liberally and repeatedly throughout the day. You should seek shade when your shadow is shorter than you. Protect yourself by wearing long sleeves, pants, a wide-brimmed hat, and sunglasses year round, whenever you are outdoors.  Once a month, do a whole body skin exam, using a mirror to look at the skin on your back. Tell your health care provider of new moles, moles that have irregular borders, moles that are larger than a pencil eraser, or moles that have changed in shape or color.  Stay current with required vaccines (immunizations).  Influenza vaccine. All adults should be immunized every year.  Tetanus, diphtheria, and acellular pertussis (Td, Tdap) vaccine. Pregnant women should  receive 1 dose of Tdap vaccine during each pregnancy. The dose should be obtained regardless of the length of time since the last dose. Immunization is preferred during the 27th-36th week of gestation. An adult who has not previously received Tdap or who does not know her vaccine status should receive 1 dose of Tdap. This initial dose should be followed by tetanus and diphtheria toxoids (Td) booster doses every 10 years. Adults with an unknown or incomplete history of completing a 3-dose immunization series with Td-containing vaccines should begin or complete a primary immunization series including a Tdap dose. Adults should receive a Td booster every 10 years.  Varicella vaccine. An adult without evidence of immunity to varicella should receive 2 doses or a second dose if she has previously received 1 dose. Pregnant females who do not have evidence of immunity should receive the first dose after pregnancy. This first dose should be obtained before leaving the health care facility. The second dose should be obtained 4-8 weeks after the first dose.  Human papillomavirus (HPV) vaccine. Females aged 13-26 years who have not received the vaccine previously should obtain the 3-dose series. The vaccine is not recommended for use in pregnant females. However, pregnancy testing is not needed before receiving a dose. If a female is found to be pregnant after receiving a dose, no treatment is needed. In that case, the remaining doses should be delayed until after the pregnancy. Immunization is recommended for any person with an immunocompromised condition through the age of 26 years if she did not get any or all doses earlier. During the 3-dose series, the second dose should be obtained 4-8 weeks after the first dose. The third dose should be obtained 24 weeks after the first dose and 16 weeks after the second dose.  Zoster vaccine. One dose is recommended for adults aged 60 years or older unless certain conditions are  present.  Measles, mumps, and rubella (MMR) vaccine. Adults born before 1957 generally are considered immune to measles and mumps. Adults born in 1957 or later should have 1 or more doses of MMR vaccine unless there is a contraindication to the vaccine or there is laboratory evidence of immunity to   each of the three diseases. A routine second dose of MMR vaccine should be obtained at least 28 days after the first dose for students attending postsecondary schools, health care workers, or international travelers. People who received inactivated measles vaccine or an unknown type of measles vaccine during 1963-1967 should receive 2 doses of MMR vaccine. People who received inactivated mumps vaccine or an unknown type of mumps vaccine before 1979 and are at high risk for mumps infection should consider immunization with 2 doses of MMR vaccine. For females of childbearing age, rubella immunity should be determined. If there is no evidence of immunity, females who are not pregnant should be vaccinated. If there is no evidence of immunity, females who are pregnant should delay immunization until after pregnancy. Unvaccinated health care workers born before 1957 who lack laboratory evidence of measles, mumps, or rubella immunity or laboratory confirmation of disease should consider measles and mumps immunization with 2 doses of MMR vaccine or rubella immunization with 1 dose of MMR vaccine.  Pneumococcal 13-valent conjugate (PCV13) vaccine. When indicated, a person who is uncertain of her immunization history and has no record of immunization should receive the PCV13 vaccine. An adult aged 19 years or older who has certain medical conditions and has not been previously immunized should receive 1 dose of PCV13 vaccine. This PCV13 should be followed with a dose of pneumococcal polysaccharide (PPSV23) vaccine. The PPSV23 vaccine dose should be obtained at least 8 weeks after the dose of PCV13 vaccine. An adult aged 19  years or older who has certain medical conditions and previously received 1 or more doses of PPSV23 vaccine should receive 1 dose of PCV13. The PCV13 vaccine dose should be obtained 1 or more years after the last PPSV23 vaccine dose.  Pneumococcal polysaccharide (PPSV23) vaccine. When PCV13 is also indicated, PCV13 should be obtained first. All adults aged 65 years and older should be immunized. An adult younger than age 65 years who has certain medical conditions should be immunized. Any person who resides in a nursing home or long-term care facility should be immunized. An adult smoker should be immunized. People with an immunocompromised condition and certain other conditions should receive both PCV13 and PPSV23 vaccines. People with human immunodeficiency virus (HIV) infection should be immunized as soon as possible after diagnosis. Immunization during chemotherapy or radiation therapy should be avoided. Routine use of PPSV23 vaccine is not recommended for American Indians, [place] Natives, or people younger than 65 years unless there are medical conditions that require PPSV23 vaccine. When indicated, people who have unknown immunization and have no record of immunization should receive PPSV23 vaccine. One-time revaccination 5 years after the first dose of PPSV23 is recommended for people aged 19-64 years who have chronic kidney failure, nephrotic syndrome, asplenia, or immunocompromised conditions. People who received 1-2 doses of PPSV23 before age 65 years should receive another dose of PPSV23 vaccine at age 65 years or later if at least 5 years have passed since the previous dose. Doses of PPSV23 are not needed for people immunized with PPSV23 at or after age 65 years.  Meningococcal vaccine. Adults with asplenia or persistent complement component deficiencies should receive 2 doses of quadrivalent meningococcal conjugate (MenACWY-D) vaccine. The doses should be obtained at least 2 months apart.  Microbiologists working with certain meningococcal bacteria, military recruits, people at risk during an outbreak, and people who travel to or live in countries with a high rate of meningitis should be immunized. A first-year college student up through age   21 years who is living in a residence hall should receive a dose if she did not receive a dose on or after her 16th birthday. Adults who have certain high-risk conditions should receive one or more doses of vaccine.  Hepatitis A vaccine. Adults who wish to be protected from this disease, have certain high-risk conditions, work with hepatitis A-infected animals, work in hepatitis A research labs, or travel to or work in countries with a high rate of hepatitis A should be immunized. Adults who were previously unvaccinated and who anticipate close contact with an international adoptee during the first 60 days after arrival in the [place] from a country with a high rate of hepatitis A should be immunized.  Hepatitis B vaccine. Adults who wish to be protected from this disease, have certain high-risk conditions, may be exposed to blood or other infectious body fluids, are household contacts or sex partners of hepatitis B positive people, are clients or workers in certain care facilities, or travel to or work in countries with a high rate of hepatitis B should be immunized.  Haemophilus influenzae type b (Hib) vaccine. A previously unvaccinated person with asplenia or sickle cell disease or having a scheduled splenectomy should receive 1 dose of Hib vaccine. Regardless of previous immunization, a recipient of a hematopoietic stem cell transplant should receive a 3-dose series 6-12 months after her successful transplant. Hib vaccine is not recommended for adults with HIV infection. Preventive Services / Frequency Ages 64 to 68 years  Blood pressure check.** / Every 1 to 2 years.  Lipid and cholesterol check.** / Every 5 years beginning at age  22.  Clinical breast exam.** / Every 3 years for women in their 88s and 53s.  BRCA-related cancer risk assessment.** / For women who have family members with a BRCA-related cancer (breast, ovarian, tubal, or peritoneal cancers).  Pap test.** / Every 2 years from ages 90 through 51. Every 3 years starting at age 21 through age 56 or 3 with a history of 3 consecutive normal Pap tests.  HPV screening.** / Every 3 years from ages 24 through ages 1 to 46 with a history of 3 consecutive normal Pap tests.  Hepatitis C blood test.** / For any individual with known risks for hepatitis C.  Skin self-exam. / Monthly.  Influenza vaccine. / Every year.  Tetanus, diphtheria, and acellular pertussis (Tdap, Td) vaccine.** / Consult your health care provider. Pregnant women should receive 1 dose of Tdap vaccine during each pregnancy. 1 dose of Td every 10 years.  Varicella vaccine.** / Consult your health care provider. Pregnant females who do not have evidence of immunity should receive the first dose after pregnancy.  HPV vaccine. / 3 doses over 6 months, if 72 and younger. The vaccine is not recommended for use in pregnant females. However, pregnancy testing is not needed before receiving a dose.  Measles, mumps, rubella (MMR) vaccine.** / You need at least 1 dose of MMR if you were born in 1957 or later. You may also need a 2nd dose. For females of childbearing age, rubella immunity should be determined. If there is no evidence of immunity, females who are not pregnant should be vaccinated. If there is no evidence of immunity, females who are pregnant should delay immunization until after pregnancy.  Pneumococcal 13-valent conjugate (PCV13) vaccine.** / Consult your health care provider.  Pneumococcal polysaccharide (PPSV23) vaccine.** / 1 to 2 doses if you smoke cigarettes or if you have certain conditions.  Meningococcal vaccine.** /  1 dose if you are age 19 to 21 years and a first-year college  student living in a residence hall, or have one of several medical conditions, you need to get vaccinated against meningococcal disease. You may also need additional booster doses.  Hepatitis A vaccine.** / Consult your health care provider.  Hepatitis B vaccine.** / Consult your health care provider.  Haemophilus influenzae type b (Hib) vaccine.** / Consult your health care provider. Ages 40 to 64 years  Blood pressure check.** / Every 1 to 2 years.  Lipid and cholesterol check.** / Every 5 years beginning at age 20 years.  Lung cancer screening. / Every year if you are aged 55-80 years and have a 30-pack-year history of smoking and currently smoke or have quit within the past 15 years. Yearly screening is stopped once you have quit smoking for at least 15 years or develop a health problem that would prevent you from having lung cancer treatment.  Clinical breast exam.** / Every year after age 40 years.  BRCA-related cancer risk assessment.** / For women who have family members with a BRCA-related cancer (breast, ovarian, tubal, or peritoneal cancers).  Mammogram.** / Every year beginning at age 40 years and continuing for as long as you are in good health. Consult with your health care provider.  Pap test.** / Every 3 years starting at age 30 years through age 65 or 70 years with a history of 3 consecutive normal Pap tests.  HPV screening.** / Every 3 years from ages 30 years through ages 65 to 70 years with a history of 3 consecutive normal Pap tests.  Fecal occult blood test (FOBT) of stool. / Every year beginning at age 50 years and continuing until age 75 years. You may not need to do this test if you get a colonoscopy every 10 years.  Flexible sigmoidoscopy or colonoscopy.** / Every 5 years for a flexible sigmoidoscopy or every 10 years for a colonoscopy beginning at age 50 years and continuing until age 75 years.  Hepatitis C blood test.** / For all people born from 1945 through  1965 and any individual with known risks for hepatitis C.  Skin self-exam. / Monthly.  Influenza vaccine. / Every year.  Tetanus, diphtheria, and acellular pertussis (Tdap/Td) vaccine.** / Consult your health care provider. Pregnant women should receive 1 dose of Tdap vaccine during each pregnancy. 1 dose of Td every 10 years.  Varicella vaccine.** / Consult your health care provider. Pregnant females who do not have evidence of immunity should receive the first dose after pregnancy.  Zoster vaccine.** / 1 dose for adults aged 60 years or older.  Measles, mumps, rubella (MMR) vaccine.** / You need at least 1 dose of MMR if you were born in 1957 or later. You may also need a 2nd dose. For females of childbearing age, rubella immunity should be determined. If there is no evidence of immunity, females who are not pregnant should be vaccinated. If there is no evidence of immunity, females who are pregnant should delay immunization until after pregnancy.  Pneumococcal 13-valent conjugate (PCV13) vaccine.** / Consult your health care provider.  Pneumococcal polysaccharide (PPSV23) vaccine.** / 1 to 2 doses if you smoke cigarettes or if you have certain conditions.  Meningococcal vaccine.** / Consult your health care provider.  Hepatitis A vaccine.** / Consult your health care provider.  Hepatitis B vaccine.** / Consult your health care provider.  Haemophilus influenzae type b (Hib) vaccine.** / Consult your health care provider. Ages 65   years and over  Blood pressure check.** / Every 1 to 2 years.  Lipid and cholesterol check.** / Every 5 years beginning at age 57 years.  Lung cancer screening. / Every year if you are aged 65-80 years and have a 30-pack-year history of smoking and currently smoke or have quit within the past 15 years. Yearly screening is stopped once you have quit smoking for at least 15 years or develop a health problem that would prevent you from having lung cancer  treatment.  Clinical breast exam.** / Every year after age 68 years.  BRCA-related cancer risk assessment.** / For women who have family members with a BRCA-related cancer (breast, ovarian, tubal, or peritoneal cancers).  Mammogram.** / Every year beginning at age 53 years and continuing for as long as you are in good health. Consult with your health care provider.  Pap test.** / Every 3 years starting at age 76 years through age 25 or 70 years with 3 consecutive normal Pap tests. Testing can be stopped between 65 and 70 years with 3 consecutive normal Pap tests and no abnormal Pap or HPV tests in the past 10 years.  HPV screening.** / Every 3 years from ages 22 years through ages 98 or 9 years with a history of 3 consecutive normal Pap tests. Testing can be stopped between 65 and 70 years with 3 consecutive normal Pap tests and no abnormal Pap or HPV tests in the past 10 years.  Fecal occult blood test (FOBT) of stool. / Every year beginning at age 74 years and continuing until age 43 years. You may not need to do this test if you get a colonoscopy every 10 years.  Flexible sigmoidoscopy or colonoscopy.** / Every 5 years for a flexible sigmoidoscopy or every 10 years for a colonoscopy beginning at age 61 years and continuing until age 63 years.  Hepatitis C blood test.** / For all people born from [birth year] through 1967 and any individual with known risks for hepatitis C.  Osteoporosis screening.** / A one-time screening for women ages 65 years and over and women at risk for fractures or osteoporosis.  Skin self-exam. / Monthly.  Influenza vaccine. / Every year.  Tetanus, diphtheria, and acellular pertussis (Tdap/Td) vaccine.** / 1 dose of Td every 10 years.  Varicella vaccine.** / Consult your health care provider.  Zoster vaccine.** / 1 dose for adults aged 69 years or older.  Pneumococcal 13-valent conjugate (PCV13) vaccine.** / Consult your health care provider.  Pneumococcal  polysaccharide (PPSV23) vaccine.** / 1 dose for all adults aged 52 years and older.  Meningococcal vaccine.** / Consult your health care provider.  Hepatitis A vaccine.** / Consult your health care provider.  Hepatitis B vaccine.** / Consult your health care provider.  Haemophilus influenzae type b (Hib) vaccine.** / Consult your health care provider. ** Family history and personal history of risk and conditions may change your health care provider's recommendations. Document Released: 07/03/2001 Document Revised: 09/21/2013 Document Reviewed: 10/02/2010 [facility] Patient Information 2015 [place], [place]. This information is not intended to replace advice given to you by your health care provider. Make sure you discuss any questions you have with your health care provider.

## 2014-02-02 NOTE — Assessment & Plan Note (Signed)
The patient is here for annual Medicare wellness examination and management of other chronic and acute problems.   The risk factors are reflected in the social history.  The roster of all physicians providing medical care to patient - is listed in the Snapshot section of the chart.  Activities of daily living:  The patient is 100% inedpendent in all ADLs: dressing, toileting, feeding as well as independent mobility - working full time.  Home safety : The patient has smoke detectors in the home. They wear seatbelts.No firearms at home ( firearms are present in the home, kept in a safe fashion). There is no violence in the home.   There is no risks for hepatitis, STDs or HIV. There is no   history of blood transfusion. They have no travel history to infectious disease endemic areas of the world.  The patient has (has not) seen their dentist in the last six month. They have (not) seen their eye doctor in the last year. They deny (admit to) any hearing difficulty and have not had audiologic testing in the last year.  They do not  have excessive sun exposure. Discussed the need for sun protection: hats, long sleeves and use of sunscreen if there is significant sun exposure.   Diet: the importance of a healthy diet is discussed. They do have a healthy (unhealthy-high fat/fast food) diet.  The patient has a regular exercise program: not lately - discussed.  The benefits of regular aerobic exercise were discussed.  Depression screen: there are no signs or vegative symptoms of depression- irritability, change in appetite, anhedonia, sadness/tearfullness.  Cognitive assessment: the patient manages all their financial and personal affairs and is actively engaged. They could relate day,date,year and events; recalled 3/3 objects at 3 minutes; performed clock-face test normally.  The following portions of the patient's history were reviewed and updated as appropriate: allergies, current medications, past  family history, past medical history,  past surgical history, past social history  and problem list.  Vision, hearing, body mass index were assessed and reviewed.   During the course of the visit the patient was educated and counseled about appropriate screening and preventive services including : fall prevention , diabetes screening, nutrition counseling, colorectal cancer screening, and recommended immunizations.  GYN exam is up to date Declined colonoscopy Cologuard discussed - info given

## 2014-02-03 ENCOUNTER — Encounter: Payer: Medicare Other | Admitting: Internal Medicine

## 2014-02-03 ENCOUNTER — Telehealth: Payer: Self-pay | Admitting: Internal Medicine

## 2014-02-03 ENCOUNTER — Ambulatory Visit: Payer: Medicare Other

## 2014-02-03 NOTE — Telephone Encounter (Signed)
error 

## 2014-02-05 ENCOUNTER — Other Ambulatory Visit: Payer: Self-pay | Admitting: Internal Medicine

## 2014-03-11 ENCOUNTER — Other Ambulatory Visit: Payer: Self-pay | Admitting: Internal Medicine

## 2014-03-12 NOTE — Telephone Encounter (Signed)
Ok to Rf in PCP absence? Thanks!! 

## 2014-03-12 NOTE — Telephone Encounter (Signed)
She is 68 yo. Please verify she is on two tranquilizers in addition to Sinequan 150 mg Chart states she got 270 Tranxene on 02/02/14.  If she has enough until Trinna Postlex gets back; he can review this.  If not I will Rx only 15 Lorazepam. Thanks, [employer]

## 2014-03-23 ENCOUNTER — Other Ambulatory Visit: Payer: Self-pay

## 2014-03-23 MED ORDER — LORAZEPAM 0.5 MG PO TABS: ORAL_TABLET | ORAL | Status: DC

## 2014-03-23 MED ORDER — CLORAZEPATE DIPOTASSIUM 15 MG PO TABS: ORAL_TABLET | ORAL | Status: DC

## 2014-05-06 ENCOUNTER — Other Ambulatory Visit: Payer: Self-pay | Admitting: Internal Medicine

## 2014-06-16 ENCOUNTER — Encounter: Payer: Self-pay | Admitting: Internal Medicine

## 2014-08-03 ENCOUNTER — Encounter: Payer: Self-pay | Admitting: Internal Medicine

## 2014-08-03 ENCOUNTER — Ambulatory Visit (INDEPENDENT_AMBULATORY_CARE_PROVIDER_SITE_OTHER): Payer: Medicare Other | Admitting: Internal Medicine

## 2014-08-03 ENCOUNTER — Other Ambulatory Visit (INDEPENDENT_AMBULATORY_CARE_PROVIDER_SITE_OTHER): Payer: Medicare Other

## 2014-08-03 VITALS — BP 160/98 | HR 73 | Temp 98.3°F | Wt 166.0 lb

## 2014-08-03 DIAGNOSIS — E038 Other specified hypothyroidism: Secondary | ICD-10-CM

## 2014-08-03 DIAGNOSIS — E034 Atrophy of thyroid (acquired): Secondary | ICD-10-CM

## 2014-08-03 DIAGNOSIS — I2510 Atherosclerotic heart disease of native coronary artery without angina pectoris: Secondary | ICD-10-CM

## 2014-08-03 DIAGNOSIS — I10 Essential (primary) hypertension: Secondary | ICD-10-CM

## 2014-08-03 DIAGNOSIS — R209 Unspecified disturbances of skin sensation: Secondary | ICD-10-CM

## 2014-08-03 LAB — BASIC METABOLIC PANEL
BUN: 16 mg/dL (ref 6–23)
CO2: 30 mEq/L (ref 19–32)
Calcium: 9.4 mg/dL (ref 8.4–10.5)
Chloride: 103 mEq/L (ref 96–112)
Creatinine, Ser: 1.06 mg/dL (ref 0.40–1.20)
GFR: 54.32 mL/min — ABNORMAL LOW (ref >60.00)
Glucose, Bld: 94 mg/dL (ref 70–99)
Potassium: 4.1 mEq/L (ref 3.5–5.1)
Sodium: 137 mEq/L (ref 135–145)

## 2014-08-03 LAB — HEPATIC FUNCTION PANEL
ALT: 12 U/L (ref 0–35)
AST: 17 U/L (ref 0–37)
Albumin: 4.4 g/dL (ref 3.5–5.2)
Alkaline Phosphatase: 99 U/L (ref 39–117)
Bilirubin, Direct: 0 mg/dL (ref 0.0–0.3)
Total Bilirubin: 0.4 mg/dL (ref 0.2–1.2)
Total Protein: 7.3 g/dL (ref 6.0–8.3)

## 2014-08-03 MED ORDER — NIFEDIPINE ER OSMOTIC RELEASE 30 MG PO TB24: 30.0000 mg | ORAL_TABLET | Freq: Every day | ORAL | Status: DC

## 2014-08-03 MED ORDER — ESTRADIOL CYPIONATE 5 MG/ML IM OIL: 0.2500 mg | TOPICAL_OIL | INTRAMUSCULAR | Status: DC

## 2014-08-03 MED ORDER — POTASSIUM CHLORIDE CRYS ER 20 MEQ PO TBCR: 20.0000 meq | EXTENDED_RELEASE_TABLET | Freq: Two times a day (BID) | ORAL | Status: DC

## 2014-08-03 MED ORDER — LORAZEPAM 0.5 MG PO TABS: ORAL_TABLET | ORAL | Status: DC

## 2014-08-03 MED ORDER — FUROSEMIDE 40 MG PO TABS: 40.0000 mg | ORAL_TABLET | Freq: Two times a day (BID) | ORAL | Status: DC

## 2014-08-03 MED ORDER — CLORAZEPATE DIPOTASSIUM 15 MG PO TABS: ORAL_TABLET | ORAL | Status: DC

## 2014-08-03 MED ORDER — DOXEPIN HCL 50 MG PO CAPS: 150.0000 mg | ORAL_CAPSULE | Freq: Every day | ORAL | Status: DC

## 2014-08-03 MED ORDER — LEVOTHYROXINE SODIUM 125 MCG PO TABS: 125.0000 ug | ORAL_TABLET | Freq: Every day | ORAL | Status: DC

## 2014-08-03 MED ORDER — METOPROLOL TARTRATE 50 MG PO TABS: 50.0000 mg | ORAL_TABLET | Freq: Three times a day (TID) | ORAL | Status: DC

## 2014-08-03 NOTE — Assessment & Plan Note (Signed)
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

## 2014-08-03 NOTE — Assessment & Plan Note (Signed)
On Nifedipine, Metoprolol Check BP at home

## 2014-08-03 NOTE — Assessment & Plan Note (Signed)
On Levothroid 

## 2014-08-03 NOTE — Progress Notes (Signed)
Pre visit review using our clinic review tool, if applicable. No additional management support is needed unless otherwise documented below in the visit note. 

## 2014-08-03 NOTE — Progress Notes (Signed)
   Subjective:   HPI  C/o B big toes R>L go numb at home at night x 1-2 mo  The patient needs to address  chronic hypertension that has been well controlled with medicines; to address chronic  hyperlipidemia controlled with medicines as well; and to address anxiety, CAD, controlled with medical treatment and diet.   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  HENT: Negative for congestion, facial swelling, hearing loss, mouth sores, sinus pressure and voice change.   Eyes: Negative for visual disturbance.  Respiratory: Negative for cough and chest tightness.   Gastrointestinal: Negative for nausea, vomiting, abdominal pain, diarrhea and rectal pain.  Genitourinary: Negative for frequency, decreased urine volume, difficulty urinating, vaginal pain and pelvic pain.  Musculoskeletal: Positive for back pain and arthralgias. Negative for myalgias and gait problem.  Skin: Negative for pallor, rash and wound.  Neurological: Negative for dizziness, tremors, weakness, numbness and headaches.  Hematological: Negative for adenopathy.  Psychiatric/Behavioral: Negative for suicidal ideas, confusion and sleep disturbance. The patient is nervous/anxious.        Objective:   Physical Exam  Constitutional: She appears well-developed and well-nourished. No distress.  HENT:  Head: Normocephalic.  Right Ear: External ear normal.  Left Ear: External ear normal.  Nose: Nose normal.  Mouth/Throat: Oropharynx is clear and moist.  Eyes: Conjunctivae are normal. Pupils are equal, round, and reactive to light. Right eye exhibits no  discharge. Left eye exhibits no discharge.  Neck: Normal range of motion. Neck supple. No JVD present. No tracheal deviation present. No thyromegaly present.  Cardiovascular: Normal rate, regular rhythm and normal heart sounds.   Pulmonary/Chest: No stridor. No respiratory distress. She has no wheezes.  Abdominal: Soft. Bowel sounds are normal. She exhibits no distension and no mass. There is no tenderness. There is no rebound and no guarding.  Musculoskeletal: She exhibits no edema or tenderness.  Lymphadenopathy:    She has no cervical adenopathy.  Neurological: She displays normal reflexes. No cranial nerve deficit. She exhibits normal muscle tone. Coordination normal.  Skin: No rash noted. No erythema.  Psychiatric: She has a normal mood and affect. Her behavior is normal. Judgment and thought content normal.  LEs w/o swelling; good pulses  Lab Results  Component Value Date   WBC 7.0 08/12/2013   HGB 12.9 08/12/2013   HCT 38.4 08/12/2013   PLT 286.0 08/12/2013   GLUCOSE 92 02/02/2014   CHOL 264* 01/10/2012   TRIG 197.0* 01/10/2012   HDL 54.70 01/10/2012   LDLDIRECT 188.2 01/10/2012   LDLCALC 76 03/21/2009   ALT 18 08/12/2013   AST 28 08/12/2013   NA 138 02/02/2014   K 4.5 02/02/2014   CL 102 02/02/2014   CREATININE 1.1 02/02/2014   BUN 14 02/02/2014   CO2 31 02/02/2014   TSH 0.89 08/12/2013   INR 1.09 08/22/2009    CBG 91     Assessment & Plan:

## 2014-08-03 NOTE — Assessment & Plan Note (Signed)
NTG prn No angina

## 2014-08-04 LAB — VITAMIN B12: Vitamin B-12: 328 pg/mL (ref 211–911)

## 2014-08-04 LAB — GLUCOSE, POCT (MANUAL RESULT ENTRY): POC Glucose: 91 mg/dl (ref 70–99)

## 2014-08-04 LAB — TSH: TSH: 3.3 u[IU]/mL (ref 0.35–4.50)

## 2014-08-06 ENCOUNTER — Encounter (HOSPITAL_COMMUNITY): Payer: Medicare Other

## 2014-08-06 ENCOUNTER — Ambulatory Visit (HOSPITAL_COMMUNITY): Payer: Medicare Other | Attending: Cardiology | Admitting: Cardiology

## 2014-08-06 ENCOUNTER — Ambulatory Visit (HOSPITAL_COMMUNITY): Payer: Medicare Other

## 2014-08-06 DIAGNOSIS — G458 Other transient cerebral ischemic attacks and related syndromes: Secondary | ICD-10-CM | POA: Diagnosis not present

## 2014-08-06 DIAGNOSIS — R0989 Other specified symptoms and signs involving the circulatory and respiratory systems: Secondary | ICD-10-CM | POA: Insufficient documentation

## 2014-08-06 DIAGNOSIS — I6523 Occlusion and stenosis of bilateral carotid arteries: Secondary | ICD-10-CM | POA: Diagnosis present

## 2014-08-06 NOTE — Progress Notes (Signed)
Carotid duplex performed 

## 2014-08-11 ENCOUNTER — Encounter: Payer: Self-pay | Admitting: Cardiovascular Disease

## 2014-08-11 ENCOUNTER — Encounter (HOSPITAL_COMMUNITY): Payer: Medicare Other

## 2014-08-11 ENCOUNTER — Ambulatory Visit (INDEPENDENT_AMBULATORY_CARE_PROVIDER_SITE_OTHER): Payer: Medicare Other | Admitting: Cardiovascular Disease

## 2014-08-11 VITALS — BP 148/84 | HR 63 | Ht 68.0 in | Wt 165.0 lb

## 2014-08-11 DIAGNOSIS — E785 Hyperlipidemia, unspecified: Secondary | ICD-10-CM | POA: Diagnosis not present

## 2014-08-11 DIAGNOSIS — I10 Essential (primary) hypertension: Secondary | ICD-10-CM | POA: Diagnosis not present

## 2014-08-11 MED ORDER — NIFEDIPINE ER OSMOTIC RELEASE 60 MG PO TB24: 60.0000 mg | ORAL_TABLET | Freq: Every day | ORAL | Status: DC

## 2014-08-11 NOTE — Patient Instructions (Signed)
Your physician has recommended you make the following change in your medication:  1. INCREASE Procardia to 60mg  take one by mouth daily  Your physician has requested that you have a carotid duplex in 1 YEAR. This test is an ultrasound of the carotid arteries in your neck. It looks at blood flow through these arteries that supply the brain with blood. Allow one hour for this exam. There are no restrictions or special instructions.  Your physician wants you to follow-up in: 1 YEAR with Dr Excell Seltzerooper.  You will receive a reminder letter in the mail two months in advance. If you don't receive a letter, please call our office to schedule the follow-up appointment.

## 2014-08-11 NOTE — Progress Notes (Signed)
Cardiology Office Note   Date:  08/13/2014   ID:  Governor RooksRuby C Fleer, DOB 04/19/1946, MRN [id number]  PCP:  Sonda PrimesAlex Plotnikov, MD  Cardiologist:  Tonny BollmanMichael Cooper, MD    Chief Complaint  Patient presents with  . Shortness of Breath     History of Present Illness: [name] is a 69 y.o. female who presents for follow-up of CAD.  The patient has hypertension, nonobstructive CAD, and hyperlipidemia. She had remote PCI of the LAD. She underwent cardiac catheterization in 2011 and this demonstrated wide patency of her stent sites with no other significant CAD. She is intolerant to all lipid-lowering therapies.  Overall she is doing pretty well. She denies chest pain or shortness of breath. She denies leg swelling or heart palpitations. She is enjoying her job. Has been unable to tolerate lipid-lowering therapies over the years because of myalgias.   Past Medical History  Diagnosis Date  . Bronchitis, acute   . Carotid bruit   . De Quervain's tenosynovitis   . Unspecified hypothyroidism   . Unspecified essential hypertension   . Other and unspecified hyperlipidemia   . Depressive disorder, not elsewhere classified   . Coronary atherosclerosis of unspecified type of vessel, native or graft     multiple angioplasy procedures in 90's/early 2000's  . Anxiety state, unspecified   . Female bladder prolapse   . 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  Current Outpatient Prescriptions  Medication Sig Dispense Refill  . aspirin 81 MG EC tablet Take 81 mg by mouth daily.      . Cholecalciferol 1000 UNITS tablet Take 1,000 Units by mouth daily.      . clorazepate (TRANXENE) 15 MG tablet TAKE ONE TABLET BY MOUTH THREE TIMES DAILY AS NEEDED FOR ANXIETY 270 tablet 0  . doxepin (SINEQUAN) 50 MG capsule Take 3 capsules (150 mg total) by mouth at bedtime. 3 tabs qhs 270 capsule 3  . estradiol cypionate (DEPO-ESTRADIOL) 5 MG/ML injection Inject 0.1 mLs (0.5 mg total) into the  muscle as directed. Every 18-25 days 5 mL 3  . furosemide (LASIX) 40 MG tablet Take 1 tablet (40 mg total) by mouth 2 (two) times daily. 180 tablet 3  . levothyroxine (SYNTHROID, LEVOTHROID) 125 MCG tablet Take 1 tablet (125 mcg total) by mouth daily. 90 tablet 3  . LORazepam (ATIVAN) 0.5 MG tablet TAKE ONE TABLET BY MOUTH AT BEDTIME 90 tablet 1  . metoprolol (LOPRESSOR) 50 MG tablet Take 1 tablet (50 mg total) by mouth 3 (three) times daily. 270 tablet 3  . NIFEdipine (PROCARDIA XL/ADALAT-CC) 60 MG 24 hr tablet Take 1 tablet (60 mg total) by mouth daily. 90 tablet 3  . nitroGLYCERIN (NITROLINGUAL) 0.4 MG/SPRAY spray Place 1 spray under the tongue every 5 (five) minutes as needed for chest pain. For chest pain - may repeat x 3 16.9 g 3  . potassium chloride SA (KLOR-CON M20) 20 MEQ tablet Take 1 tablet (20 mEq total) by mouth 2 (two) times daily. 180 tablet 3  . ranitidine (ZANTAC) 150 MG tablet Take 150 mg by mouth as needed.     No current facility-administered medications for this visit.    Allergies:   Epinephrine; Isosorbide mononitrate; Lidocaine; Morphine; Propoxyphene n-acetaminophen; Alprazolam; Atorvastatin; Codeine; Ezetimibe; Prilosec; Rosuvastatin; Simvastatin; Tetracycline; Amoxicillin; Ciprofloxacin; and Diphenhydramine hcl   Social History:  The patient  reports that she quit smoking about 23 years ago. She does not have any smokeless  tobacco history on file. She reports that she does not drink alcohol or use illicit drugs.   Family History:  The patient's  family history includes Coronary artery disease in her other; Hypertension in her father, mother, and other.    ROS:  Please see the history of present illness.  Otherwise, review of systems is positive for numbness and tingling in her toes, exertional dyspnea, and vision change related to cataracts.  All other systems are reviewed and negative.    PHYSICAL EXAM: VS:  BP 148/84 mmHg  Pulse 63  Ht  (1.727 m)  Wt 165  lb (74.844 kg)  BMI 25.09 kg/m2 , BMI Body mass index is 25.09 kg/(m^2). GEN: Well nourished, well developed, in no acute distress HEENT: normal Neck: no JVD, no masses. Left greater than right carotid bruits Cardiac: RRR without murmur or gallop                Respiratory:  clear to auscultation bilaterally, normal work of breathing GI: soft, nontender, nondistended, + BS MS: no deformity or atrophy Ext: no pretibial edema, pedal pulses 2+= bilaterally Skin: warm and dry, no rash Neuro:  Strength and sensation are intact Psych: euthymic mood, full affect  EKG:  EKG is ordered today. The ekg ordered today shows NSR 63 bpm, minor nonspecific ST abnormality  Recent Labs: 08/03/2014: ALT 12; BUN 16; Creatinine 1.06; Potassium 4.1; Sodium 137; TSH 3.30   Lipid Panel     Component Value Date/Time   CHOL 264* 01/10/2012 1135   TRIG 197.0* 01/10/2012 1135   HDL 54.70 01/10/2012 1135   CHOLHDL 5 01/10/2012 1135   VLDL 39.4 01/10/2012 1135   LDLCALC 76 03/21/2009 0848   LDLDIRECT 188.2 01/10/2012 1135      Wt Readings from Last 3 Encounters:  08/11/14 165 lb (74.844 kg)  08/03/14 166 lb (75.297 kg)  02/02/14 165 lb (74.844 kg)     Cardiac Studies Reviewed: Carotid Duplex: 3.18.2016: < 40% ICA stenosis bilaterally Mild left subclavian steal  ASSESSMENT AND PLAN: 1.  CAD, native vessel: The patient is stable without symptoms of angina. Her last heart catheterization demonstrated patency of her stent site and no other significant obstruction. Her medical program was reviewed and will be continued. She cannot tolerate any lipid-lowering therapies and she has multiple drug allergies.  2. HTN: Suboptimal blood pressure control. Recommended increasing Procardia XL to 60 mg daily  3. Carotid atherosclerosis, nonobstructive: Mild carotid stenosis. Will repeat her carotid duplex in one year  4. 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 I checked the patient's blood pressure today and there is a 12 mm difference between the left and the right arms (160/88 on the left, 172/88 on the right). Will repeat her carotid duplex scan which we will reassess her subclavian disease in one year. Reviewed the disease process with the patient and I think it is unlikely she will require any specific treatment.  Current medicines are reviewed with the patient today.  The patient  concerns regarding medicines.  The following changes have been made:  Increase Procardia XL to 60 mg daily  Labs/ tests ordered today include:   Orders Placed This Encounter  Procedures  . EKG 12-Lead    Disposition:   FU one year  Signed, Tonny Bollman, MD  08/13/2014 10:33 PM    [facility] [address] [place], [place], [place]  [address] Phone: [phone]; Fax: [phone]

## 2014-08-16 ENCOUNTER — Encounter (HOSPITAL_COMMUNITY): Payer: Self-pay | Admitting: Cardiovascular Disease

## 2014-08-17 ENCOUNTER — Telehealth: Payer: Self-pay | Admitting: Cardiovascular Disease

## 2014-08-17 NOTE — Telephone Encounter (Signed)
Left message on machine for pt to contact the office.   Message sent through My Chart: [name],    I just called your home and left a message.  We would like you to continue taking Lopressor twice a day and Lasix once a day.  We will update your medication list to reflect how you are currently taking these medications. Please go ahead and increase your Procardia XL to 60mg  daily.  Lauren RN

## 2014-08-17 NOTE — Telephone Encounter (Signed)
I spoke with the pt and made her aware of recommendations.  The pt will increase Procardia and continue to monitor BP.

## 2014-08-17 NOTE — Telephone Encounter (Signed)
New Message    Patient needs to make sure Dr. Excell Seltzerooper has received the emails that the patient has sent to him regarding her medication. She needs someone to call her back.

## 2014-08-19 ENCOUNTER — Encounter (HOSPITAL_COMMUNITY): Payer: Self-pay | Admitting: Cardiovascular Disease

## 2014-08-19 ENCOUNTER — Telehealth: Payer: Self-pay | Admitting: Cardiovascular Disease

## 2014-08-19 NOTE — Telephone Encounter (Signed)
Left message for pt to call back to discuss her concerns. 

## 2014-08-19 NOTE — Telephone Encounter (Signed)
New Message  Pt calling to discuss a medication change. Pt, in the positive, is breathing better due to the change. Pt wants to know what to do going forward. Pt sent message to Dr. Excell Seltzerooper on Mychart and wants to make sure he sees it. Please leave detailed message on home VM. Please call back and discuss.     Pt c/o medication issue: 1. Name of Medication: Generic Procardia  2. How are you currently taking this medication (dosage and times per day)? 60 mg 3. Are you having a reaction (difficulty breathing--STAT)?  Headaches/pressure in the back of the neck and head, irregular HR- all reactions come and go 4. What is your medication issue? Pt wants to discuss her reactions and to know what to do going forward.

## 2014-08-19 NOTE — Telephone Encounter (Signed)
Error - wrong provider

## 2014-08-20 NOTE — Telephone Encounter (Signed)
Left another message for pt to call back to discuss her concerns.

## 2014-08-22 ENCOUNTER — Encounter (HOSPITAL_COMMUNITY): Payer: Self-pay | Admitting: Cardiovascular Disease

## 2014-08-23 NOTE — Telephone Encounter (Signed)
Concerns addressed thru my chart per Dr. Excell Seltzerooper

## 2014-11-11 ENCOUNTER — Telehealth: Payer: Self-pay | Admitting: *Deleted

## 2014-11-11 NOTE — Telephone Encounter (Signed)
Depo Estradiol 5 mg/ml PA initiated via CoverMyMeds.

## 2014-11-12 NOTE — Telephone Encounter (Signed)
PA is approved per CoverMyMeds. Pharmacy informed.

## 2014-11-15 ENCOUNTER — Other Ambulatory Visit: Payer: Self-pay

## 2014-12-23 ENCOUNTER — Telehealth: Payer: Self-pay | Admitting: Internal Medicine

## 2014-12-23 ENCOUNTER — Encounter: Payer: Self-pay | Admitting: Internal Medicine

## 2014-12-23 ENCOUNTER — Telehealth: Payer: Self-pay

## 2014-12-23 ENCOUNTER — Ambulatory Visit (INDEPENDENT_AMBULATORY_CARE_PROVIDER_SITE_OTHER): Payer: Medicare Other | Admitting: Internal Medicine

## 2014-12-23 ENCOUNTER — Ambulatory Visit (INDEPENDENT_AMBULATORY_CARE_PROVIDER_SITE_OTHER)
Admission: RE | Admit: 2014-12-23 | Discharge: 2014-12-23 | Disposition: A | Discharge status: Home / Self Care | Payer: Medicare Other | Source: Ambulatory Visit | Attending: Internal Medicine | Admitting: Internal Medicine

## 2014-12-23 VITALS — BP 140/82 | HR 64 | Temp 98.1°F | Resp 16 | Ht 68.0 in | Wt 167.0 lb

## 2014-12-23 DIAGNOSIS — R1013 Epigastric pain: Secondary | ICD-10-CM

## 2014-12-23 DIAGNOSIS — N951 Menopausal and female climacteric states: Secondary | ICD-10-CM | POA: Diagnosis not present

## 2014-12-23 DIAGNOSIS — I2510 Atherosclerotic heart disease of native coronary artery without angina pectoris: Secondary | ICD-10-CM

## 2014-12-23 DIAGNOSIS — K2100 Gastro-esophageal reflux disease with esophagitis, without bleeding: Secondary | ICD-10-CM

## 2014-12-23 DIAGNOSIS — I5030 Unspecified diastolic (congestive) heart failure: Secondary | ICD-10-CM

## 2014-12-23 DIAGNOSIS — R935 Abnormal findings on diagnostic imaging of other abdominal regions, including retroperitoneum: Secondary | ICD-10-CM | POA: Diagnosis not present

## 2014-12-23 MED ORDER — ESTRADIOL CYPIONATE 5 MG/ML IM OIL
1.0000 mg | TOPICAL_OIL | Freq: Once | INTRAMUSCULAR | Status: AC
  Administered 2014-12-23: 1 mg via INTRAMUSCULAR

## 2014-12-23 MED ORDER — RANITIDINE HCL 300 MG PO TABS: 300.0000 mg | ORAL_TABLET | Freq: Every day | ORAL | Status: DC

## 2014-12-23 MED ORDER — HYOSCYAMINE SULFATE ER 0.375 MG PO TB12: 0.3750 mg | ORAL_TABLET | Freq: Two times a day (BID) | ORAL | Status: DC

## 2014-12-23 NOTE — Telephone Encounter (Signed)
Spoke with patient per MD to advise that xray shows small blockage as well as ordering CT. Pt notified that she will be contacted with appointment information regarding CT and to seek ED tonight if any issues. Pt gave verbal understanding.

## 2014-12-23 NOTE — Progress Notes (Signed)
Pre visit review using our clinic review tool, if applicable. No additional management support is needed unless otherwise documented below in the visit note. 

## 2014-12-23 NOTE — Patient Instructions (Signed)

## 2014-12-23 NOTE — Progress Notes (Signed)
Subjective:  Patient ID: [name], female    DOB: 02/12/1946  Age: 69 y.o. MRN: [id number]  CC: Abdominal Pain   HPI MABRY SANTARELLI presents for a 4 day history of epigastric abdominal pain. She describes it as a stabbing and sticking sensation that radiates up into her lower sternum. She was concerned about heart disease so she tried nitroglycerin with no relief from the discomfort. She eventually took Mylanta and said that did help control the discomfort.   Outpatient Prescriptions Prior to Visit  Medication Sig Dispense Refill  . aspirin 81 MG EC tablet Take 81 mg by mouth daily.      . Cholecalciferol 1000 UNITS tablet Take 1,000 Units by mouth daily.      . clorazepate (TRANXENE) 15 MG tablet TAKE ONE TABLET BY MOUTH THREE TIMES DAILY AS NEEDED FOR ANXIETY 270 tablet 0  . doxepin (SINEQUAN) 50 MG capsule Take 3 capsules (150 mg total) by mouth at bedtime. 3 tabs qhs 270 capsule 3  . estradiol cypionate (DEPO-ESTRADIOL) 5 MG/ML injection Inject 0.1 mLs (0.5 mg total) into the muscle as directed. Every 18-25 days 5 mL 3  . furosemide (LASIX) 40 MG tablet Take 40 mg by mouth daily.    Marland Kitchen levothyroxine (SYNTHROID, LEVOTHROID) 125 MCG tablet Take 1 tablet (125 mcg total) by mouth daily. 90 tablet 3  . LORazepam (ATIVAN) 0.5 MG tablet TAKE ONE TABLET BY MOUTH AT BEDTIME 90 tablet 1  . metoprolol (LOPRESSOR) 50 MG tablet Take 50 mg by mouth 2 (two) times daily.    . nitroGLYCERIN (NITROLINGUAL) 0.4 MG/SPRAY spray Place 1 spray under the tongue every 5 (five) minutes as needed for chest pain. For chest pain - may repeat x 3 16.9 g 3  . potassium chloride SA (KLOR-CON M20) 20 MEQ tablet Take 1 tablet (20 mEq total) by mouth 2 (two) times daily. 180 tablet 3  . ranitidine (ZANTAC) 150 MG tablet Take 150 mg by mouth as needed.    Marland Kitchen NIFEdipine (PROCARDIA XL/ADALAT-CC) 60 MG 24 hr tablet Take 1 tablet (60 mg total) by mouth daily. (Patient not taking: Reported on 12/23/2014) 90 tablet 3   No  facility-administered medications prior to visit.    ROS Review of Systems  Constitutional: Negative.  Negative for fever, chills, diaphoresis, appetite change and fatigue.  HENT: Negative.  Negative for sinus pressure, trouble swallowing and voice change.   Eyes: Negative.   Respiratory: Negative.  Negative for cough, choking, chest tightness, shortness of breath and stridor.   Cardiovascular: Negative.  Negative for chest pain, palpitations and leg swelling.  Gastrointestinal: Positive for abdominal pain. Negative for nausea, vomiting, diarrhea, constipation, blood in stool, abdominal distention, anal bleeding and rectal pain.  Endocrine: Negative.   Genitourinary: Negative.  Negative for difficulty urinating.  Musculoskeletal: Negative.   Skin: Negative.   Allergic/Immunologic: Negative.   Neurological: Negative.   Hematological: Negative.  Negative for adenopathy. Does not bruise/bleed easily.  Psychiatric/Behavioral: Negative.     Objective:  BP 140/82 mmHg  Pulse 64  Temp(Src) 98.1 F (36.7 C) (Oral)  Resp 16  Ht 5\' 8"  (1.727 m)  Wt 167 lb (75.751 kg)  BMI 25.40 kg/m2  SpO2 98%  BP Readings from Last 3 Encounters:  12/23/14 140/82  08/11/14 148/84  08/03/14 160/98    Wt Readings from Last 3 Encounters:  12/23/14 167 lb (75.751 kg)  08/11/14 165 lb (74.844 kg)  08/03/14 166 lb (75.297 kg)    Physical Exam  Constitutional: She is oriented to person, place, and time. She appears well-developed and well-nourished.  Non-toxic appearance. She does not have a sickly appearance. She does not appear ill. No distress.  HENT:  Mouth/Throat: Oropharynx is clear and moist. No oropharyngeal exudate.  Eyes: Conjunctivae are normal. Right eye exhibits no discharge. Left eye exhibits no discharge. No scleral icterus.  Neck: Normal range of motion. Neck supple. No JVD present. No tracheal deviation present. No thyromegaly present.  Cardiovascular: Normal rate, regular rhythm,  normal heart sounds and intact distal pulses.  Exam reveals no gallop and no friction rub.   No murmur heard. EKG today-  Sinus rhythm with short PR. LVH with no ST or T-wave changes. No Q waves. This EKG appears unchanged when compared to an EKG done about 4 months ago.  Pulmonary/Chest: Effort normal and breath sounds normal. No stridor. No respiratory distress. She has no wheezes. She has no rales. She exhibits no tenderness.  Abdominal: Soft. Bowel sounds are normal. She exhibits no distension and no mass. There is no tenderness. There is no rebound and no guarding.  Musculoskeletal: Normal range of motion. She exhibits no edema or tenderness.  Lymphadenopathy:    She has no cervical adenopathy.  Neurological: She is oriented to person, place, and time.  Skin: Skin is warm and dry. No rash noted. She is not diaphoretic. No erythema. No pallor.  Psychiatric: She has a normal mood and affect. Her behavior is normal. Judgment and thought content normal.    Lab Results  Component Value Date   WBC 7.0 08/12/2013   HGB 12.9 08/12/2013   HCT 38.4 08/12/2013   PLT 286.0 08/12/2013   GLUCOSE 94 08/03/2014   CHOL 264* 01/10/2012   TRIG 197.0* 01/10/2012   HDL 54.70 01/10/2012   LDLDIRECT 188.2 01/10/2012   LDLCALC 76 03/21/2009   ALT 12 08/03/2014   AST 17 08/03/2014   NA 137 08/03/2014   K 4.1 08/03/2014   CL 103 08/03/2014   CREATININE 1.06 08/03/2014   BUN 16 08/03/2014   CO2 30 08/03/2014   TSH 3.30 08/03/2014   INR 1.09 08/22/2009    Dg Abd Acute W/chest  12/23/2014   CLINICAL DATA:  Two days of epigastric pain, known hiatal hernia.  EXAM: DG ABDOMEN ACUTE W/ 1V CHEST  COMPARISON:  PA and lateral chest x-ray dated August 22, 2009  FINDINGS: The lungs are adequately inflated and clear. The heart and pulmonary vascularity are normal. There is no pleural effusion. The mediastinum is normal in width.  Within the abdomen there is moderate gaseous distention of the stomach. There are  loops of mildly distended gas-filled small bowel in the mid and upper abdomen. There is no significant colonic distention. There are no free extraluminal gas collections. There are degenerative changes of the lower lumbar spine. There is a subtle 4 mm rounded calcification in the right mid abdomen which may be related to the overlying costal cartilages but could reflect a gallstone or kidney stone.  IMPRESSION: 1. There is no acute cardiopulmonary abnormality. 2. Mild gaseous distention of the stomach and small bowel. This may reflect a mid to distal small bowel obstruction. There is no evidence of perforation.   Electronically Signed   By: David  [place] M.D.   On: 12/23/2014 15:58    Assessment & Plan:   Cally was seen today for abdominal pain.  Diagnoses and all orders for this visit:  Gastroesophageal reflux disease with esophagitis- her symptoms may be related  to untreated gastroesophageal reflux disease with spasm. She does not tolerate proton pump inhibitors due to chest pain and diarrhea. Will try an H2 blocker and Levbid. Orders: -     ranitidine (ZANTAC) 300 MG tablet; Take 1 tablet (300 mg total) by mouth at bedtime. -     hyoscyamine (LEVBID) 0.375 MG 12 hr tablet; Take 1 tablet (0.375 mg total) by mouth 2 (two) times daily.  Abdominal pain, epigastric- she does not have an acute abdominal exam today. Her plain films are positive for concern of gastric gaseous distention and possible small bowel obstruction, I have ordered an ultrasound to see if she has gallstones, will get a CT scan done to see if indeed she does have a small bowel obstruction, she was notified of the abnormal x-ray today, she was asked to report to the emergency room if she develops any new or worsening symptoms. Orders: -     DG Abd Acute W/Chest; Future -     US Abdomen Complete; Future -     hyoscyamine (LEVBID) 0.375 MG 12 hr tablet; Take 1 tablet (0.375 mg total) by mouth 2 (two) times daily.  Heart failure,  diastolic, due to CAD, unspecified failure chronicity- she has no signs or symptoms suspicious for heart failure or ischemia today. Orders: -     EKG 12-Lead  Hot flash, menopausal Orders: -     estradiol cypionate (DEPO-ESTRADIOL) 5 MG/ML injection 1 mg; Inject 0.2 mLs (1 mg total) into the muscle once.  Abnormal abdominal x-ray- I have ordered a stat CT scan of the abdomen to see if she indeed has a small bowel obstruction.   I have discontinued Ms. Mathia's ranitidine. I am also having her start on ranitidine and hyoscyamine. Additionally, I am having her maintain her aspirin, Cholecalciferol, nitroGLYCERIN, clorazepate, doxepin, estradiol cypionate, potassium chloride SA, levothyroxine, LORazepam, furosemide, metoprolol, and NIFEdipine. We administered estradiol cypionate.  Meds ordered this encounter  Medications  . NIFEdipine (PROCARDIA-XL/ADALAT-CC/NIFEDICAL-XL) 30 MG 24 hr tablet    Sig: Take 30 mg by mouth daily.  . ranitidine (ZANTAC) 300 MG tablet    Sig: Take 1 tablet (300 mg total) by mouth at bedtime.    Dispense:  90 tablet    Refill:  3  . hyoscyamine (LEVBID) 0.375 MG 12 hr tablet    Sig: Take 1 tablet (0.375 mg total) by mouth 2 (two) times daily.    Dispense:  60 tablet    Refill:  3  . estradiol cypionate (DEPO-ESTRADIOL) 5 MG/ML injection 1 mg    Sig:      Follow-up: Return in about 4 weeks (around 01/20/2015).  Sanda Linger, MD

## 2014-12-23 NOTE — Telephone Encounter (Signed)
Patient Name: RONDA G [facility] DOB: [birth year] Initial Comment caller states she felt like something was stuck in esophagus and it hurts between her breasts Nurse Assessment Nurse: Yetta Barre, RN, Miranda Date/Time (Eastern Time): 12/23/2014 8:17:10 AM Confirm and document reason for call. If symptomatic, describe symptoms. ---Caller states Monday - Tuesday felt like she had indigestion and took Mylanta. Yesterday she did not have symptoms until later in the day and increasing in intensity. She states she has mild discomfort on the top part of her stomach. She also had some loose stools. Has the patient traveled out of the country within the last 30 days? ---No Does the patient require triage? ---Yes Related visit to physician within the last 2 weeks? ---No Does the PT have any chronic conditions? (i.e. diabetes, asthma, etc.) ---Yes List chronic conditions. ---Thyroid, Anxiety, Back pain, HTN, GERD, Guidelines Guideline Title Affirmed Question Affirmed Notes Abdominal Pain - Upper [1] MODERATE pain (e.g., interferes with normal activities) AND [2] comes and goes (cramps) AND [3] present > 24 hours (Exception: pain with Vomiting or Diarrhea - see that Guideline) Final Disposition User See Physician within 24 Hours Yetta Barre, RN, Miranda Comments Dr. Posey Rea is not in today, appt scheduled for 11am with Dr. Sanda Linger. Referrals REFERRED TO PCP OFFICE Disagree/Comply: Comply

## 2014-12-24 ENCOUNTER — Other Ambulatory Visit (INDEPENDENT_AMBULATORY_CARE_PROVIDER_SITE_OTHER): Payer: Medicare Other

## 2014-12-24 ENCOUNTER — Ambulatory Visit (INDEPENDENT_AMBULATORY_CARE_PROVIDER_SITE_OTHER)
Admission: RE | Admit: 2014-12-24 | Discharge: 2014-12-24 | Disposition: A | Discharge status: Home / Self Care | Payer: Medicare Other | Source: Ambulatory Visit | Attending: Internal Medicine | Admitting: Internal Medicine

## 2014-12-24 ENCOUNTER — Telehealth: Payer: Self-pay

## 2014-12-24 ENCOUNTER — Other Ambulatory Visit: Payer: Self-pay | Admitting: Internal Medicine

## 2014-12-24 DIAGNOSIS — R935 Abnormal findings on diagnostic imaging of other abdominal regions, including retroperitoneum: Secondary | ICD-10-CM

## 2014-12-24 DIAGNOSIS — I5030 Unspecified diastolic (congestive) heart failure: Secondary | ICD-10-CM

## 2014-12-24 DIAGNOSIS — R1013 Epigastric pain: Secondary | ICD-10-CM

## 2014-12-24 LAB — BUN: BUN: 18 mg/dL (ref 6–23)

## 2014-12-24 LAB — CREATININE, SERUM: Creatinine, Ser: 1.29 mg/dL — ABNORMAL HIGH (ref 0.40–1.20)

## 2014-12-24 MED ORDER — IOHEXOL 300 MG/ML  SOLN
80.0000 mL | Freq: Once | INTRAMUSCULAR | Status: AC | PRN
  Administered 2014-12-24: 80 mL via INTRAVENOUS

## 2014-12-24 NOTE — Telephone Encounter (Signed)
i called patient to advise that radiology report was showing no obstruction---and that dr Yetta Barre would be following up with her next week--i gave patient a list of foods to avoid that would aggravate the esophagus (causing gerd or reflux symptoms) or make symptoms worse--i advised that dr Yetta Barre might decide to follow up with GI referral for GERD but someone would contact her next week to advise the plan of care

## 2014-12-25 ENCOUNTER — Encounter: Payer: Self-pay | Admitting: Internal Medicine

## 2014-12-27 ENCOUNTER — Encounter: Payer: Self-pay | Admitting: Internal Medicine

## 2014-12-28 ENCOUNTER — Encounter: Payer: Self-pay | Admitting: Internal Medicine

## 2014-12-28 ENCOUNTER — Telehealth: Payer: Self-pay | Admitting: *Deleted

## 2014-12-28 NOTE — Telephone Encounter (Signed)
[facility] - Client TELEPHONE ADVICE RECORD [name] Patient Name: [name] Gender: Female DOB: Jun 26, 1945 Age: 69 Y 4 M 3 D Return Phone Number: [phone] (Primary) Address: City/State/Zip: Box Client [facility] - Client Client Site [facility] Day Physician Plotnikov, Alex Contact Type Call Call Type Triage / Clinical Relationship To Patient Self Appointment Disposition EMR Appointment Scheduled Info pasted into Epic Yes Return Phone Number [phone] (Primary) Chief Complaint CHEST PAIN (>=21 years) - pain, pressure, heaviness or tightness Initial Comment caller states she felt like something was stuck in esophagus and it hurts between her breasts PreDisposition Did not know what to do Nurse Assessment Nurse: Yetta Barre, RN, Miranda Date/Time (Eastern Time): 12/23/2014 8:17:10 AM Confirm and document reason for call. If symptomatic, describe symptoms. ---Caller states Monday - Tuesday felt like she had indigestion and took Mylanta. Yesterday she did not have symptoms until later in the day and increasing in intensity. She states she has mild discomfort on the top part of her stomach. She also had some loose stools. Has the patient traveled out of the country within the last 30 days? ---No Does the patient require triage? ---Yes Related visit to physician within the last 2 weeks? ---No Does the PT have any chronic conditions? (i.e. diabetes, asthma, etc.) ---Yes List chronic conditions. ---Thyroid, Anxiety, Back pain, HTN, GERD, Guidelines Guideline Title Affirmed Question Affirmed Notes Nurse Date/Time Lamount Cohen Time) Abdominal Pain - Upper [1] MODERATE pain (e.g., interferes with normal activities) AND [2] comes and goes (cramps) AND [3] present > 24 hours (Exception: pain with Vomiting or Diarrhea - see that Guideline)

## 2014-12-29 ENCOUNTER — Encounter: Payer: Self-pay | Admitting: Internal Medicine

## 2015-01-04 ENCOUNTER — Other Ambulatory Visit: Payer: Medicare Other

## 2015-01-05 ENCOUNTER — Ambulatory Visit
Admission: RE | Admit: 2015-01-05 | Discharge: 2015-01-05 | Disposition: A | Discharge status: Home / Self Care | Payer: Medicare Other | Source: Ambulatory Visit | Attending: Internal Medicine | Admitting: Internal Medicine

## 2015-01-05 ENCOUNTER — Encounter: Payer: Self-pay | Admitting: Internal Medicine

## 2015-01-05 DIAGNOSIS — R1013 Epigastric pain: Secondary | ICD-10-CM

## 2015-01-31 ENCOUNTER — Ambulatory Visit (INDEPENDENT_AMBULATORY_CARE_PROVIDER_SITE_OTHER): Payer: Medicare Other

## 2015-01-31 DIAGNOSIS — E348 Other specified endocrine disorders: Secondary | ICD-10-CM

## 2015-01-31 DIAGNOSIS — IMO0002 Reserved for concepts with insufficient information to code with codable children: Secondary | ICD-10-CM

## 2015-01-31 MED ORDER — ESTRADIOL CYPIONATE 5 MG/ML IM OIL
1.0000 mg | TOPICAL_OIL | INTRAMUSCULAR | Status: DC
Start: 2015-01-31 — End: 2017-04-08
  Administered 2015-01-31 – 2017-03-12 (×4): 1 mg via INTRAMUSCULAR

## 2015-02-12 ENCOUNTER — Other Ambulatory Visit: Payer: Self-pay | Admitting: Internal Medicine

## 2015-02-14 NOTE — Telephone Encounter (Signed)
Please advise about clorazepate rx request, last office visit march, 2016---thanks

## 2015-02-14 NOTE — Telephone Encounter (Signed)
Patient called in and I did schedule an appointment with her for Oct 12

## 2015-02-15 ENCOUNTER — Encounter: Payer: Self-pay | Admitting: Internal Medicine

## 2015-02-15 ENCOUNTER — Other Ambulatory Visit: Payer: Self-pay | Admitting: Internal Medicine

## 2015-02-16 NOTE — Telephone Encounter (Signed)
Clorazepate and ativan called to pharm

## 2015-02-25 ENCOUNTER — Encounter: Payer: Self-pay | Admitting: Internal Medicine

## 2015-02-25 ENCOUNTER — Ambulatory Visit (INDEPENDENT_AMBULATORY_CARE_PROVIDER_SITE_OTHER): Payer: Medicare Other | Admitting: Internal Medicine

## 2015-02-25 VITALS — BP 170/90 | HR 60 | Wt 162.0 lb

## 2015-02-25 DIAGNOSIS — E034 Atrophy of thyroid (acquired): Secondary | ICD-10-CM

## 2015-02-25 DIAGNOSIS — I10 Essential (primary) hypertension: Secondary | ICD-10-CM

## 2015-02-25 DIAGNOSIS — I2510 Atherosclerotic heart disease of native coronary artery without angina pectoris: Secondary | ICD-10-CM

## 2015-02-25 DIAGNOSIS — F32A Depression, unspecified: Secondary | ICD-10-CM

## 2015-02-25 DIAGNOSIS — F419 Anxiety disorder, unspecified: Secondary | ICD-10-CM | POA: Diagnosis not present

## 2015-02-25 DIAGNOSIS — F329 Major depressive disorder, single episode, unspecified: Secondary | ICD-10-CM

## 2015-02-25 DIAGNOSIS — E038 Other specified hypothyroidism: Secondary | ICD-10-CM | POA: Diagnosis not present

## 2015-02-25 MED ORDER — NITROGLYCERIN 0.4 MG/SPRAY TL SOLN: 1.0000 | Status: DC | PRN

## 2015-02-25 NOTE — Assessment & Plan Note (Signed)
Toprol, Nifedipine 

## 2015-02-25 NOTE — Assessment & Plan Note (Addendum)
Tranxene prn , Lorazepam prn, Doxepine at hs Pt taking meds at night (not before or when driving)  Potential benefits of a long term benzodiazepines  use as well as potential risks  and complications were explained to the patient and were aknowledged. She is OK to drive

## 2015-02-25 NOTE — Assessment & Plan Note (Signed)
Chronic. No angina Toprol, Procardia Pt has not taken NTG in a long time She is OK to drive

## 2015-02-25 NOTE — Assessment & Plan Note (Signed)
Levothroid 
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
## 2015-02-25 NOTE — Assessment & Plan Note (Signed)
Doing well Doxepine at hs

## 2015-02-25 NOTE — Progress Notes (Signed)
Pre visit review using our clinic review tool, if applicable. No additional management support is needed unless otherwise documented below in the visit note. 

## 2015-02-28 ENCOUNTER — Telehealth: Payer: Self-pay | Admitting: *Deleted

## 2015-02-28 DIAGNOSIS — Z7689 Persons encountering health services in other specified circumstances: Secondary | ICD-10-CM

## 2015-02-28 NOTE — Telephone Encounter (Signed)
[place] DMV form is completed/ready for p/u. Left detailed mess informing pt.

## 2015-03-02 ENCOUNTER — Encounter: Payer: Self-pay | Admitting: Internal Medicine

## 2015-03-02 ENCOUNTER — Encounter: Payer: Medicare Other | Admitting: Internal Medicine

## 2015-04-05 ENCOUNTER — Ambulatory Visit (INDEPENDENT_AMBULATORY_CARE_PROVIDER_SITE_OTHER): Payer: Medicare Other | Admitting: *Deleted

## 2015-04-05 ENCOUNTER — Encounter: Payer: Self-pay | Admitting: Internal Medicine

## 2015-04-05 DIAGNOSIS — E348 Other specified endocrine disorders: Secondary | ICD-10-CM | POA: Diagnosis not present

## 2015-04-05 DIAGNOSIS — IMO0002 Reserved for concepts with insufficient information to code with codable children: Secondary | ICD-10-CM

## 2015-04-05 MED ORDER — ESTRADIOL CYPIONATE 5 MG/ML IM OIL
1.0000 mg | TOPICAL_OIL | Freq: Once | INTRAMUSCULAR | Status: AC
  Administered 2015-04-05: 1 mg via INTRAMUSCULAR

## 2015-04-12 ENCOUNTER — Telehealth: Payer: Self-pay

## 2015-04-12 NOTE — Telephone Encounter (Signed)
PA initiated via covermymeds. ZOX:[id number]:[id number]

## 2015-04-13 NOTE — Telephone Encounter (Signed)
Faxed note to pharmacy: Waiting on Response.

## 2015-05-10 ENCOUNTER — Ambulatory Visit (INDEPENDENT_AMBULATORY_CARE_PROVIDER_SITE_OTHER): Payer: Medicare Other

## 2015-05-10 ENCOUNTER — Telehealth: Payer: Self-pay

## 2015-05-10 DIAGNOSIS — N951 Menopausal and female climacteric states: Secondary | ICD-10-CM | POA: Diagnosis not present

## 2015-05-10 MED ORDER — ESTRADIOL CYPIONATE 5 MG/ML IM OIL
0.5000 mg | TOPICAL_OIL | Freq: Once | INTRAMUSCULAR | Status: AC
  Administered 2015-05-10: 0.5 mg via INTRAMUSCULAR

## 2015-05-10 NOTE — Telephone Encounter (Signed)
Patient came in this morning for a nurse visit for a hormone injection. Patient had already drawn up medication prior to coming in for the visit. Dr. Posey ReaPlotnikov administered the medication.

## 2015-05-20 LAB — HM MAMMOGRAPHY

## 2015-06-01 ENCOUNTER — Encounter: Payer: Self-pay | Admitting: Internal Medicine

## 2015-06-01 ENCOUNTER — Encounter: Payer: Self-pay | Admitting: Cardiovascular Disease

## 2015-06-06 ENCOUNTER — Ambulatory Visit (INDEPENDENT_AMBULATORY_CARE_PROVIDER_SITE_OTHER): Payer: Medicare Other | Admitting: Internal Medicine

## 2015-06-06 ENCOUNTER — Encounter: Payer: Self-pay | Admitting: Internal Medicine

## 2015-06-06 VITALS — BP 168/90 | HR 83 | Wt 165.0 lb

## 2015-06-06 DIAGNOSIS — F419 Anxiety disorder, unspecified: Secondary | ICD-10-CM

## 2015-06-06 DIAGNOSIS — I10 Essential (primary) hypertension: Secondary | ICD-10-CM

## 2015-06-06 NOTE — Assessment & Plan Note (Signed)
Chronic. On Rx. BP is OK at home. Toprol, Nifedipine

## 2015-06-06 NOTE — Progress Notes (Signed)
Pre visit review using our clinic review tool, if applicable. No additional management support is needed unless otherwise documented below in the visit note. 

## 2015-06-06 NOTE — Assessment & Plan Note (Signed)
Tranxene prn , Lorazepam prn, Doxepine at hs Pt taking meds at night (not when driving)  Potential benefits of a long term benzodiazepines  use as well as potential risks  and complications were explained to the patient and were aknowledged.  

## 2015-06-06 NOTE — Progress Notes (Signed)
Subjective:  Patient ID: [name], female    DOB: 04/10/1946  Age: 70 y.o. MRN: [id number]  CC: No chief complaint on file.   HPI [name] StallRuby C [name] presents for anxiety, [facility]  Outpatient Prescriptions Prior to Visit  Medication Sig Dispense Refill  . aspirin 81 MG EC tablet Take 81 mg by mouth daily.      . Cholecalciferol 1000 UNITS tablet Take 1,000 Units by mouth daily.      . clorazepate (TRANXENE) 15 MG tablet TAKE ONE TABLET BY MOUTH THREE TIMES DAILY AS NEEDED FOR ANXIETY 270 tablet 5  . doxepin (SINEQUAN) 50 MG capsule Take 3 capsules (150 mg total) by mouth at bedtime. 3 tabs qhs 270 capsule 3  . estradiol cypionate (DEPO-ESTRADIOL) 5 MG/ML injection Inject 0.1 mLs (0.5 mg total) into the muscle as directed. Every 18-25 days 5 mL 3  . furosemide (LASIX) 40 MG tablet Take 40 mg by mouth daily.    Marland Kitchen. levothyroxine (SYNTHROID, LEVOTHROID) 125 MCG tablet Take 1 tablet (125 mcg total) by mouth daily. 90 tablet 3  . LORazepam (ATIVAN) 0.5 MG tablet TAKE ONE TABLET BY MOUTH AT BEDTIME 90 tablet 5  . metoprolol (LOPRESSOR) 50 MG tablet Take 50 mg by mouth 2 (two) times daily.    Marland Kitchen. NIFEdipine (PROCARDIA-XL/ADALAT-CC/NIFEDICAL-XL) 30 MG 24 hr tablet Take 30 mg by mouth daily.    . nitroGLYCERIN (NITROLINGUAL) 0.4 MG/SPRAY spray Place 1 spray under the tongue every 5 (five) minutes as needed for chest pain. For chest pain - may repeat x 3 16.9 g 3  . potassium chloride SA (KLOR-CON M20) 20 MEQ tablet Take 1 tablet (20 mEq total) by mouth 2 (two) times daily. 180 tablet 3  . ranitidine (ZANTAC) 300 MG tablet Take 1 tablet (300 mg total) by mouth at bedtime. (Patient taking differently: Take 300 mg by mouth at bedtime as needed. ) 90 tablet 3  . hyoscyamine (LEVBID) 0.375 MG 12 hr tablet Take 1 tablet (0.375 mg total) by mouth 2 (two) times daily. (Patient not taking: Reported on 06/06/2015) 60 tablet 3   Facility-Administered Medications Prior to Visit  Medication Dose Route Frequency Provider  Last Rate Last Dose  . estradiol cypionate (DEPO-ESTRADIOL) 5 MG/ML injection 1 mg  1 mg Intramuscular Q30 days Jacinta ShoeAleksei Plotnikov V, MD   1 mg at 01/31/15 0933    ROS Review of Systems  Constitutional: Negative for chills, activity change, appetite change, fatigue and unexpected weight change.  HENT: Negative for congestion, mouth sores and sinus pressure.   Eyes: Negative for visual disturbance.  Respiratory: Negative for cough and chest tightness.   Gastrointestinal: Negative for nausea and abdominal pain.  Genitourinary: Negative for frequency, difficulty urinating and vaginal pain.  Musculoskeletal: Negative for back pain and gait problem.  Skin: Negative for pallor and rash.  Neurological: Negative for dizziness, tremors, weakness, numbness and headaches.  Psychiatric/Behavioral: Negative for suicidal ideas, confusion and sleep disturbance. The patient is not nervous/anxious.     Objective:  BP 168/90 mmHg  Pulse 83  Wt 165 lb (74.844 kg)  SpO2 96%  BP Readings from Last 3 Encounters:  06/06/15 168/90  02/25/15 170/90  12/23/14 140/82    Wt Readings from Last 3 Encounters:  06/06/15 165 lb (74.844 kg)  02/25/15 162 lb (73.483 kg)  12/23/14 167 lb (75.751 kg)    Physical Exam  Constitutional: She appears well-developed. No distress.  HENT:  Head: Normocephalic.  Right Ear: External ear normal.  Left Ear:  External ear normal.  Nose: Nose normal.  Mouth/Throat: Oropharynx is clear and moist.  Eyes: Conjunctivae are normal. Pupils are equal, round, and reactive to light. Right eye exhibits no discharge. Left eye exhibits no discharge.  Neck: Normal range of motion. Neck supple. No JVD present. No tracheal deviation present. No thyromegaly present.  Cardiovascular: Normal rate, regular rhythm and normal heart sounds.   Pulmonary/Chest: No stridor. No respiratory distress. She has no wheezes.  Abdominal: Soft. Bowel sounds are normal. She exhibits no distension and no  mass. There is no tenderness. There is no rebound and no guarding.  Musculoskeletal: She exhibits no edema or tenderness.  Lymphadenopathy:    She has no cervical adenopathy.  Neurological: She displays normal reflexes. No cranial nerve deficit. She exhibits normal muscle tone. Coordination normal.  Skin: No rash noted. No erythema.  Psychiatric: She has a normal mood and affect. Her behavior is normal. Judgment and thought content normal.    Lab Results  Component Value Date   WBC 7.0 08/12/2013   HGB 12.9 08/12/2013   HCT 38.4 08/12/2013   PLT 286.0 08/12/2013   GLUCOSE 94 08/03/2014   CHOL 264* 01/10/2012   TRIG 197.0* 01/10/2012   HDL 54.70 01/10/2012   LDLDIRECT 188.2 01/10/2012   LDLCALC 76 03/21/2009   ALT 12 08/03/2014   AST 17 08/03/2014   NA 137 08/03/2014   K 4.1 08/03/2014   CL 103 08/03/2014   CREATININE 1.29* 12/24/2014   BUN 18 12/24/2014   CO2 30 08/03/2014   TSH 3.30 08/03/2014   INR 1.09 08/22/2009    US Abdomen Complete  01/05/2015  CLINICAL DATA:  Abdominal pain EXAM: ULTRASOUND ABDOMEN COMPLETE COMPARISON:  CT abdomen and pelvis December 24, 2014 FINDINGS: Gallbladder: No gallstones or wall thickening visualized. There is no pericholecystic fluid. No sonographic Murphy sign noted. Common bile duct: Diameter: 4 mm. There is no intrahepatic, common hepatic, or common bile duct dilatation. Liver: No focal lesion identified. Within normal limits in parenchymal echogenicity. IVC: No abnormality visualized. Pancreas: No mass or inflammatory focus. Spleen: Size and appearance within normal limits. Right Kidney: Length: 9.7 cm. Echogenicity within normal limits. No mass or hydronephrosis visualized. Left Kidney: Length: 11.7 cm. Echogenicity within normal limits. No mass or hydronephrosis visualized. Abdominal aorta: No aneurysm visualized. Atherosclerotic change is noted in the aorta. Other findings: No demonstrable ascites. IMPRESSION: Atherosclerotic change in the aorta  without aneurysm. Study otherwise unremarkable. Electronically Signed   By: Bretta Bang III M.D.   On: 01/05/2015 09:46    Assessment & Plan:   Diagnoses and all orders for this visit:  Essential hypertension  Chronic anxiety  I am having Ms. Abee maintain her aspirin, Cholecalciferol, doxepin, estradiol cypionate, potassium chloride SA, levothyroxine, furosemide, metoprolol, NIFEdipine, ranitidine, hyoscyamine, clorazepate, LORazepam, and nitroGLYCERIN. We will continue to administer estradiol cypionate.  No orders of the defined types were placed in this encounter.     Follow-up: Return in about 4 weeks (around 07/04/2015) for a follow-up visit.  Sonda Primes, MD

## 2015-07-05 ENCOUNTER — Encounter: Payer: Self-pay | Admitting: Internal Medicine

## 2015-07-05 ENCOUNTER — Ambulatory Visit (INDEPENDENT_AMBULATORY_CARE_PROVIDER_SITE_OTHER): Payer: Medicare Other | Admitting: Internal Medicine

## 2015-07-05 VITALS — BP 168/92 | HR 57 | Wt 166.0 lb

## 2015-07-05 DIAGNOSIS — I10 Essential (primary) hypertension: Secondary | ICD-10-CM | POA: Diagnosis not present

## 2015-07-05 MED ORDER — LOSARTAN POTASSIUM 50 MG PO TABS: 50.0000 mg | ORAL_TABLET | Freq: Every day | ORAL | Status: DC

## 2015-07-05 NOTE — Patient Instructions (Signed)
Start Losartan 50 mg/day if BPs are >140/90

## 2015-07-05 NOTE — Assessment & Plan Note (Signed)
Start Losartan 50 mg/day if BPs are >140/90 

## 2015-07-05 NOTE — Progress Notes (Signed)
Subjective:  Patient ID: [name], female    DOB: February 19, 1946  Age: 70 y.o. MRN: [id number]  CC: No chief complaint on file.   HPI [name] presents for BP check  Outpatient Prescriptions Prior to Visit  Medication Sig Dispense Refill  . aspirin 81 MG EC tablet Take 81 mg by mouth daily.      . Cholecalciferol 1000 UNITS tablet Take 1,000 Units by mouth daily.      . clorazepate (TRANXENE) 15 MG tablet TAKE ONE TABLET BY MOUTH THREE TIMES DAILY AS NEEDED FOR ANXIETY 270 tablet 5  . doxepin (SINEQUAN) 50 MG capsule Take 3 capsules (150 mg total) by mouth at bedtime. 3 tabs qhs 270 capsule 3  . estradiol cypionate (DEPO-ESTRADIOL) 5 MG/ML injection Inject 0.1 mLs (0.5 mg total) into the muscle as directed. Every 18-25 days 5 mL 3  . furosemide (LASIX) 40 MG tablet Take 40 mg by mouth daily.    Marland Kitchen levothyroxine (SYNTHROID, LEVOTHROID) 125 MCG tablet Take 1 tablet (125 mcg total) by mouth daily. 90 tablet 3  . LORazepam (ATIVAN) 0.5 MG tablet TAKE ONE TABLET BY MOUTH AT BEDTIME 90 tablet 5  . metoprolol (LOPRESSOR) 50 MG tablet Take 50 mg by mouth 2 (two) times daily.    Marland Kitchen NIFEdipine (PROCARDIA-XL/ADALAT-CC/NIFEDICAL-XL) 30 MG 24 hr tablet Take 30 mg by mouth daily.    . nitroGLYCERIN (NITROLINGUAL) 0.4 MG/SPRAY spray Place 1 spray under the tongue every 5 (five) minutes as needed for chest pain. For chest pain - may repeat x 3 16.9 g 3  . potassium chloride SA (KLOR-CON M20) 20 MEQ tablet Take 1 tablet (20 mEq total) by mouth 2 (two) times daily. 180 tablet 3  . ranitidine (ZANTAC) 300 MG tablet Take 1 tablet (300 mg total) by mouth at bedtime. (Patient taking differently: Take 300 mg by mouth at bedtime as needed. ) 90 tablet 3  . hyoscyamine (LEVBID) 0.375 MG 12 hr tablet Take 1 tablet (0.375 mg total) by mouth 2 (two) times daily. (Patient not taking: Reported on 07/05/2015) 60 tablet 3   Facility-Administered Medications Prior to Visit  Medication Dose Route Frequency Provider Last  Rate Last Dose  . estradiol cypionate (DEPO-ESTRADIOL) 5 MG/ML injection 1 mg  1 mg Intramuscular Q30 days Jacinta Shoe V, MD   1 mg at 01/31/15 0933    ROS Review of Systems  Objective:  BP 168/92 mmHg  Pulse 57  Wt 166 lb (75.297 kg)  SpO2 96%  BP Readings from Last 3 Encounters:  07/05/15 168/92  06/06/15 168/90  02/25/15 170/90    Wt Readings from Last 3 Encounters:  07/05/15 166 lb (75.297 kg)  06/06/15 165 lb (74.844 kg)  02/25/15 162 lb (73.483 kg)    Physical Exam  Lab Results  Component Value Date   WBC 7.0 08/12/2013   HGB 12.9 08/12/2013   HCT 38.4 08/12/2013   PLT 286.0 08/12/2013   GLUCOSE 94 08/03/2014   CHOL 264* 01/10/2012   TRIG 197.0* 01/10/2012   HDL 54.70 01/10/2012   LDLDIRECT 188.2 01/10/2012   LDLCALC 76 03/21/2009   ALT 12 08/03/2014   AST 17 08/03/2014   NA 137 08/03/2014   K 4.1 08/03/2014   CL 103 08/03/2014   CREATININE 1.29* 12/24/2014   BUN 18 12/24/2014   CO2 30 08/03/2014   TSH 3.30 08/03/2014   INR 1.09 08/22/2009    US Abdomen Complete  01/05/2015  CLINICAL DATA:  Abdominal pain EXAM: ULTRASOUND  ABDOMEN COMPLETE COMPARISON:  CT abdomen and pelvis December 24, 2014 FINDINGS: Gallbladder: No gallstones or wall thickening visualized. There is no pericholecystic fluid. No sonographic Murphy sign noted. Common bile duct: Diameter: 4 mm. There is no intrahepatic, common hepatic, or common bile duct dilatation. Liver: No focal lesion identified. Within normal limits in parenchymal echogenicity. IVC: No abnormality visualized. Pancreas: No mass or inflammatory focus. Spleen: Size and appearance within normal limits. Right Kidney: Length: 9.7 cm. Echogenicity within normal limits. No mass or hydronephrosis visualized. Left Kidney: Length: 11.7 cm. Echogenicity within normal limits. No mass or hydronephrosis visualized. Abdominal aorta: No aneurysm visualized. Atherosclerotic change is noted in the aorta. Other findings: No demonstrable  ascites. IMPRESSION: Atherosclerotic change in the aorta without aneurysm. Study otherwise unremarkable. Electronically Signed   By: Bretta Bang III M.D.   On: 01/05/2015 09:46    Assessment & Plan:   There are no diagnoses linked to this encounter. I am having Ms. Narine maintain her aspirin, Cholecalciferol, doxepin, estradiol cypionate, potassium chloride SA, levothyroxine, furosemide, metoprolol, NIFEdipine, ranitidine, hyoscyamine, clorazepate, LORazepam, and nitroGLYCERIN. We will continue to administer estradiol cypionate.  No orders of the defined types were placed in this encounter.     Follow-up: No Follow-up on file.  Sonda Primes, MD

## 2015-07-05 NOTE — Progress Notes (Signed)
Pre visit review using our clinic review tool, if applicable. No additional management support is needed unless otherwise documented below in the visit note. 

## 2015-07-06 ENCOUNTER — Encounter: Payer: Self-pay | Admitting: Internal Medicine

## 2015-08-05 ENCOUNTER — Encounter: Payer: Self-pay | Admitting: Internal Medicine

## 2015-08-05 ENCOUNTER — Ambulatory Visit (INDEPENDENT_AMBULATORY_CARE_PROVIDER_SITE_OTHER): Payer: Medicare Other | Admitting: Internal Medicine

## 2015-08-05 VITALS — BP 144/88 | HR 66 | Temp 97.7°F | Resp 18 | Ht 68.0 in | Wt 166.0 lb

## 2015-08-05 DIAGNOSIS — I10 Essential (primary) hypertension: Secondary | ICD-10-CM | POA: Diagnosis not present

## 2015-08-05 NOTE — Progress Notes (Signed)
Pre visit review using our clinic review tool, if applicable. No additional management support is needed unless otherwise documented below in the visit note. 

## 2015-08-07 ENCOUNTER — Other Ambulatory Visit: Payer: Self-pay | Admitting: Internal Medicine

## 2015-08-07 ENCOUNTER — Encounter: Payer: Self-pay | Admitting: Internal Medicine

## 2015-08-07 NOTE — Progress Notes (Signed)
Subjective:  Patient ID: [name], female    DOB: 07-Nov-1945  Age: 70 y.o. MRN: [id number]  CC: No chief complaint on file.   HPI FARHA DANO presents for HTN  Outpatient Prescriptions Prior to Visit  Medication Sig Dispense Refill  . aspirin 81 MG EC tablet Take 81 mg by mouth daily.      . Cholecalciferol 1000 UNITS tablet Take 1,000 Units by mouth daily.      . clorazepate (TRANXENE) 15 MG tablet TAKE ONE TABLET BY MOUTH THREE TIMES DAILY AS NEEDED FOR ANXIETY 270 tablet 5  . doxepin (SINEQUAN) 50 MG capsule Take 3 capsules (150 mg total) by mouth at bedtime. 3 tabs qhs 270 capsule 3  . estradiol cypionate (DEPO-ESTRADIOL) 5 MG/ML injection Inject 0.1 mLs (0.5 mg total) into the muscle as directed. Every 18-25 days 5 mL 3  . furosemide (LASIX) 40 MG tablet Take 40 mg by mouth daily.    . hyoscyamine (LEVBID) 0.375 MG 12 hr tablet Take 1 tablet (0.375 mg total) by mouth 2 (two) times daily. (Patient not taking: Reported on 07/05/2015) 60 tablet 3  . levothyroxine (SYNTHROID, LEVOTHROID) 125 MCG tablet Take 1 tablet (125 mcg total) by mouth daily. 90 tablet 3  . LORazepam (ATIVAN) 0.5 MG tablet TAKE ONE TABLET BY MOUTH AT BEDTIME 90 tablet 5  . losartan (COZAAR) 50 MG tablet Take 1 tablet (50 mg total) by mouth daily. 30 tablet 11  . metoprolol (LOPRESSOR) 50 MG tablet Take 50 mg by mouth 2 (two) times daily.    Marland Kitchen NIFEdipine (PROCARDIA-XL/ADALAT-CC/NIFEDICAL-XL) 30 MG 24 hr tablet Take 30 mg by mouth daily.    . nitroGLYCERIN (NITROLINGUAL) 0.4 MG/SPRAY spray Place 1 spray under the tongue every 5 (five) minutes as needed for chest pain. For chest pain - may repeat x 3 16.9 g 3  . potassium chloride SA (KLOR-CON M20) 20 MEQ tablet Take 1 tablet (20 mEq total) by mouth 2 (two) times daily. 180 tablet 3  . ranitidine (ZANTAC) 300 MG tablet Take 1 tablet (300 mg total) by mouth at bedtime. (Patient taking differently: Take 300 mg by mouth at bedtime as needed. ) 90 tablet 3    Facility-Administered Medications Prior to Visit  Medication Dose Route Frequency Provider Last Rate Last Dose  . estradiol cypionate (DEPO-ESTRADIOL) 5 MG/ML injection 1 mg  1 mg Intramuscular Q30 days Jacinta Shoe V, MD   1 mg at 01/31/15 0933    ROS Review of Systems  Cardiovascular: Negative for chest pain and leg swelling.    Objective:  BP 144/88 mmHg  Pulse 66  Temp(Src) 97.7 F (36.5 C) (Oral)  Resp 18  Ht  (1.727 m)  Wt 166 lb (75.297 kg)  BMI 25.25 kg/m2  SpO2 98%  BP Readings from Last 3 Encounters:  08/05/15 144/88  07/05/15 168/92  06/06/15 168/90    Wt Readings from Last 3 Encounters:  08/05/15 166 lb (75.297 kg)  07/05/15 166 lb (75.297 kg)  06/06/15 165 lb (74.844 kg)    Physical Exam  Cardiovascular: Normal rate.   No murmur heard. Pulmonary/Chest: She has no wheezes. She has no rales.  Musculoskeletal: She exhibits no edema.    Lab Results  Component Value Date   WBC 7.0 08/12/2013   HGB 12.9 08/12/2013   HCT 38.4 08/12/2013   PLT 286.0 08/12/2013   GLUCOSE 94 08/03/2014   CHOL 264* 01/10/2012   TRIG 197.0* 01/10/2012   HDL 54.70 01/10/2012  LDLDIRECT 188.2 01/10/2012   LDLCALC 76 03/21/2009   ALT 12 08/03/2014   AST 17 08/03/2014   NA 137 08/03/2014   K 4.1 08/03/2014   CL 103 08/03/2014   CREATININE 1.29* 12/24/2014   BUN 18 12/24/2014   CO2 30 08/03/2014   TSH 3.30 08/03/2014   INR 1.09 08/22/2009    [place] Abdomen Complete  01/05/2015  CLINICAL DATA:  Abdominal pain EXAM: ULTRASOUND ABDOMEN COMPLETE COMPARISON:  CT abdomen and pelvis December 24, 2014 FINDINGS: Gallbladder: No gallstones or wall thickening visualized. There is no pericholecystic fluid. No sonographic Murphy sign noted. Common bile duct: Diameter: 4 mm. There is no intrahepatic, common hepatic, or common bile duct dilatation. Liver: No focal lesion identified. Within normal limits in parenchymal echogenicity. IVC: No abnormality visualized. Pancreas: No  mass or inflammatory focus. Spleen: Size and appearance within normal limits. Right Kidney: Length: 9.7 cm. Echogenicity within normal limits. No mass or hydronephrosis visualized. Left Kidney: Length: 11.7 cm. Echogenicity within normal limits. No mass or hydronephrosis visualized. Abdominal aorta: No aneurysm visualized. Atherosclerotic change is noted in the aorta. Other findings: No demonstrable ascites. IMPRESSION: Atherosclerotic change in the aorta without aneurysm. Study otherwise unremarkable. Electronically Signed   By: Bretta BangWilliam  Woodruff III M.D.   On: 01/05/2015 09:46    Assessment & Plan:   There are no diagnoses linked to this encounter. I am having Ms. Dorothyann GibbsFinch maintain her aspirin, Cholecalciferol, doxepin, estradiol cypionate, potassium chloride SA, levothyroxine, furosemide, metoprolol, NIFEdipine, ranitidine, hyoscyamine, clorazepate, LORazepam, nitroGLYCERIN, and losartan. We will continue to administer estradiol cypionate.  No orders of the defined types were placed in this encounter.     Follow-up: No Follow-up on file.  Sonda PrimesAlex Plotnikov, MD

## 2015-08-08 ENCOUNTER — Encounter: Payer: Self-pay | Admitting: Internal Medicine

## 2015-08-08 DIAGNOSIS — K2100 Gastro-esophageal reflux disease with esophagitis, without bleeding: Secondary | ICD-10-CM

## 2015-08-09 MED ORDER — LOSARTAN POTASSIUM 50 MG PO TABS: 50.0000 mg | ORAL_TABLET | Freq: Every day | ORAL | Status: DC

## 2015-08-09 MED ORDER — RANITIDINE HCL 300 MG PO TABS: 300.0000 mg | ORAL_TABLET | Freq: Every day | ORAL | Status: DC

## 2015-08-09 MED ORDER — NITROGLYCERIN 0.4 MG/SPRAY TL SOLN: 1.0000 | Status: DC | PRN

## 2015-08-09 MED ORDER — POTASSIUM CHLORIDE CRYS ER 20 MEQ PO TBCR
20.0000 meq | EXTENDED_RELEASE_TABLET | Freq: Two times a day (BID) | ORAL | Status: DC
Start: 2015-08-09 — End: 2016-02-29

## 2015-08-09 NOTE — Telephone Encounter (Signed)
Pt is also needing clorazepate...Raechel Chute/lmb

## 2015-08-09 NOTE — Telephone Encounter (Signed)
Duplicate msg's see other email...Raechel Chute/lmb

## 2015-08-09 NOTE — Telephone Encounter (Signed)
Pls advise on Lorazepam & Estradiol  all other has been sent...Raechel Chute/lmb

## 2015-08-10 ENCOUNTER — Telehealth: Payer: Self-pay | Admitting: Internal Medicine

## 2015-08-10 DIAGNOSIS — K2100 Gastro-esophageal reflux disease with esophagitis, without bleeding: Secondary | ICD-10-CM

## 2015-08-10 DIAGNOSIS — R1013 Epigastric pain: Secondary | ICD-10-CM

## 2015-08-10 NOTE — Telephone Encounter (Signed)
Ms Dorothyann GibbsFinch, [place] March 2017. I'll ask Misty StanleyStacey to update the rest. Thanks, AP ===View-only below this line===   ----- Message -----    From: Judge StallFINCH,Emer C    Sent: 08/08/2015 12:45 PM EDT      To: Sonda PrimesAlex Plotnikov, MD Subject: Visit Follow-Up Question  Dr. Posey ReaPlotnikov: I need all my prescriptions renewed.  I need to refill and have to wait for you to renew. [facility] is faxing the meds to you. You have not renewed my prescriptions since March 2016 and I forgot about it Friday, 08-05-14 when I was there.  Please renew all 9 of them as you usually do - for one year.  Thanks. Sharyon Medicusuby Koenen

## 2015-08-11 MED ORDER — CLORAZEPATE DIPOTASSIUM 15 MG PO TABS: 15.0000 mg | ORAL_TABLET | Freq: Three times a day (TID) | ORAL | Status: DC | PRN

## 2015-08-11 MED ORDER — ESTRADIOL CYPIONATE 5 MG/ML IM OIL: 0.2500 mg | TOPICAL_OIL | INTRAMUSCULAR | Status: DC

## 2015-08-11 MED ORDER — HYOSCYAMINE SULFATE ER 0.375 MG PO TB12: 0.3750 mg | ORAL_TABLET | Freq: Two times a day (BID) | ORAL | Status: DC

## 2015-08-11 NOTE — Telephone Encounter (Signed)
Sent electronically except for clorazepate had to call in [facility] left on pharmacy vm...Raechel Chute/lmb

## 2015-08-11 NOTE — Telephone Encounter (Signed)
Yes. Thx.

## 2015-08-11 NOTE — Telephone Encounter (Signed)
All her refills was sent back to [facility] 08/07/15 except for her Clorazepate, Estradiol, and Hycosamine. Are they ok for refills...Raechel Chute/lmb

## 2015-09-08 ENCOUNTER — Ambulatory Visit (INDEPENDENT_AMBULATORY_CARE_PROVIDER_SITE_OTHER): Payer: Medicare Other | Admitting: Internal Medicine

## 2015-09-08 ENCOUNTER — Encounter: Payer: Self-pay | Admitting: Internal Medicine

## 2015-09-08 VITALS — BP 170/90 | HR 68 | Wt 165.0 lb

## 2015-09-08 DIAGNOSIS — N951 Menopausal and female climacteric states: Secondary | ICD-10-CM | POA: Diagnosis not present

## 2015-09-08 DIAGNOSIS — I10 Essential (primary) hypertension: Secondary | ICD-10-CM

## 2015-09-08 MED ORDER — ESTRADIOL CYPIONATE 5 MG/ML IM OIL: TOPICAL_OIL | INTRAMUSCULAR | Status: DC

## 2015-09-08 NOTE — Progress Notes (Signed)
Subjective:  Patient ID: [name], female    DOB: 03-07-1946  Age: 70 y.o. MRN: [id number]  CC: No chief complaint on file.   HPI [name] presents for anxiety, HRT  Outpatient Prescriptions Prior to Visit  Medication Sig Dispense Refill  . aspirin 81 MG EC tablet Take 81 mg by mouth daily.      . Cholecalciferol 1000 UNITS tablet Take 1,000 Units by mouth daily.      . clorazepate (TRANXENE) 15 MG tablet Take 1 tablet (15 mg total) by mouth 3 (three) times daily as needed. for anxiety 270 tablet 0  . doxepin (SINEQUAN) 50 MG capsule TAKE THREE CAPSULES BY MOUTH AT BEDTIME 270 capsule 3  . furosemide (LASIX) 40 MG tablet TAKE ONE TABLET BY MOUTH TWICE DAILY 180 tablet 3  . hyoscyamine (LEVBID) 0.375 MG 12 hr tablet Take 1 tablet (0.375 mg total) by mouth 2 (two) times daily. 180 tablet 0  . KLOR-CON M20 20 MEQ tablet TAKE ONE TABLET BY MOUTH TWICE DAILY 180 tablet 3  . levothyroxine (SYNTHROID, LEVOTHROID) 125 MCG tablet TAKE ONE TABLET BY MOUTH ONCE DAILY 90 tablet 3  . LORazepam (ATIVAN) 0.5 MG tablet TAKE ONE TABLET BY MOUTH AT BEDTIME 90 tablet 5  . losartan (COZAAR) 50 MG tablet Take 1 tablet (50 mg total) by mouth daily. 90 tablet 3  . metoprolol (LOPRESSOR) 50 MG tablet TAKE ONE TABLET BY MOUTH THREE TIMES DAILY 270 tablet 3  . NIFEdipine (PROCARDIA-XL/ADALAT-CC/NIFEDICAL-XL) 30 MG 24 hr tablet TAKE ONE TABLET BY MOUTH ONCE DAILY 90 tablet 3  . nitroGLYCERIN (NITROLINGUAL) 0.4 MG/SPRAY spray Place 1 spray under the tongue every 5 (five) minutes as needed for chest pain. For chest pain - may repeat x 3 16.9 g 3  . potassium chloride SA (KLOR-CON M20) 20 MEQ tablet Take 1 tablet (20 mEq total) by mouth 2 (two) times daily. 180 tablet 3  . ranitidine (ZANTAC) 300 MG tablet Take 1 tablet (300 mg total) by mouth at bedtime. 90 tablet 3  . estradiol cypionate (DEPO-ESTRADIOL) 5 MG/ML injection Inject 0.1 mLs (0.5 mg total) into the muscle as directed. Every 18-25 days 5 mL 3    Facility-Administered Medications Prior to Visit  Medication Dose Route Frequency Provider Last Rate Last Dose  . estradiol cypionate (DEPO-ESTRADIOL) 5 MG/ML injection 1 mg  1 mg Intramuscular Q30 days Jacinta Shoe V, MD   1 mg at 01/31/15 0933    ROS Review of Systems  Constitutional: Negative for fever, chills, diaphoresis, activity change, appetite change, fatigue and unexpected weight change.  HENT: Negative for congestion, mouth sores and sinus pressure.   Eyes: Negative for visual disturbance.  Respiratory: Negative for cough and chest tightness.   Gastrointestinal: Negative for nausea and abdominal pain.  Genitourinary: Negative for frequency, difficulty urinating and vaginal pain.  Musculoskeletal: Negative for back pain and gait problem.  Skin: Negative for pallor and rash.  Neurological: Negative for dizziness, tremors, weakness, numbness and headaches.  Psychiatric/Behavioral: Negative for confusion and sleep disturbance.    Objective:  BP 170/90 mmHg  Pulse 68  Wt 165 lb (74.844 kg)  SpO2 95%  BP Readings from Last 3 Encounters:  09/08/15 170/90  08/05/15 144/88  07/05/15 168/92    Wt Readings from Last 3 Encounters:  09/08/15 165 lb (74.844 kg)  08/05/15 166 lb (75.297 kg)  07/05/15 166 lb (75.297 kg)    Physical Exam  Constitutional: She appears well-nourished. No distress.  Lab Results  Component Value Date   WBC 7.0 08/12/2013   HGB 12.9 08/12/2013   HCT 38.4 08/12/2013   PLT 286.0 08/12/2013   GLUCOSE 94 08/03/2014   CHOL 264* 01/10/2012   TRIG 197.0* 01/10/2012   HDL 54.70 01/10/2012   LDLDIRECT 188.2 01/10/2012   LDLCALC 76 03/21/2009   ALT 12 08/03/2014   AST 17 08/03/2014   NA 137 08/03/2014   K 4.1 08/03/2014   CL 103 08/03/2014   CREATININE 1.29* 12/24/2014   BUN 18 12/24/2014   CO2 30 08/03/2014   TSH 3.30 08/03/2014   INR 1.09 08/22/2009    [place] Abdomen Complete  01/05/2015  CLINICAL DATA:  Abdominal pain EXAM:  ULTRASOUND ABDOMEN COMPLETE COMPARISON:  CT abdomen and pelvis December 24, 2014 FINDINGS: Gallbladder: No gallstones or wall thickening visualized. There is no pericholecystic fluid. No sonographic Murphy sign noted. Common bile duct: Diameter: 4 mm. There is no intrahepatic, common hepatic, or common bile duct dilatation. Liver: No focal lesion identified. Within normal limits in parenchymal echogenicity. IVC: No abnormality visualized. Pancreas: No mass or inflammatory focus. Spleen: Size and appearance within normal limits. Right Kidney: Length: 9.7 cm. Echogenicity within normal limits. No mass or hydronephrosis visualized. Left Kidney: Length: 11.7 cm. Echogenicity within normal limits. No mass or hydronephrosis visualized. Abdominal aorta: No aneurysm visualized. Atherosclerotic change is noted in the aorta. Other findings: No demonstrable ascites. IMPRESSION: Atherosclerotic change in the aorta without aneurysm. Study otherwise unremarkable. Electronically Signed   By: Bretta BangWilliam  Woodruff III M.D.   On: 01/05/2015 09:46    Assessment & Plan:   There are no diagnoses linked to this encounter. I have changed [name]'s estradiol cypionate. I am also having her maintain her aspirin, Cholecalciferol, LORazepam, KLOR-CON M20, NIFEdipine, metoprolol, levothyroxine, furosemide, doxepin, ranitidine, losartan, potassium chloride SA, nitroGLYCERIN, hyoscyamine, and clorazepate. We will continue to administer estradiol cypionate.  Meds ordered this encounter  Medications  . estradiol cypionate (DEPO-ESTRADIOL) 5 MG/ML injection    Sig: Use 0.3 ml every 18-25 days IM    Dispense:  5 mL    Refill:  3     Follow-up: No Follow-up on file.  Sonda PrimesAlex Plotnikov, MD

## 2015-09-08 NOTE — Assessment & Plan Note (Signed)
Nl BP at home 

## 2015-09-08 NOTE — Progress Notes (Signed)
Pre visit review using our clinic review tool, if applicable. No additional management support is needed unless otherwise documented below in the visit note. 

## 2015-09-08 NOTE — Assessment & Plan Note (Signed)
She would like to cont Depo 0.3 ml  Potential benefits of a long term HRT  use as well as potential risks  and complications were explained to the patient and were aknowledged.

## 2015-10-10 ENCOUNTER — Encounter: Payer: Self-pay | Admitting: Internal Medicine

## 2015-10-10 ENCOUNTER — Ambulatory Visit (INDEPENDENT_AMBULATORY_CARE_PROVIDER_SITE_OTHER): Payer: Medicare Other | Admitting: Internal Medicine

## 2015-10-10 VITALS — BP 160/88 | HR 59 | Wt 166.0 lb

## 2015-10-10 DIAGNOSIS — H269 Unspecified cataract: Secondary | ICD-10-CM | POA: Diagnosis not present

## 2015-10-10 DIAGNOSIS — F419 Anxiety disorder, unspecified: Secondary | ICD-10-CM

## 2015-10-10 NOTE — Assessment & Plan Note (Signed)
Surgery pending

## 2015-10-10 NOTE — Progress Notes (Signed)
Pre visit review using our clinic review tool, if applicable. No additional management support is needed unless otherwise documented below in the visit note. 

## 2015-10-10 NOTE — Progress Notes (Signed)
Subjective:  Patient ID: [name] StallRuby C [name], female    DOB: 07/12/1945  Age: 70 y.o. MRN: [id number]  CC: No chief complaint on file.   HPI [name] presents for poor vision at night, HTN  Outpatient Prescriptions Prior to Visit  Medication Sig Dispense Refill  . aspirin 81 MG EC tablet Take 81 mg by mouth daily.      . Cholecalciferol 1000 UNITS tablet Take 1,000 Units by mouth daily.      . clorazepate (TRANXENE) 15 MG tablet Take 1 tablet (15 mg total) by mouth 3 (three) times daily as needed. for anxiety 270 tablet 0  . doxepin (SINEQUAN) 50 MG capsule TAKE THREE CAPSULES BY MOUTH AT BEDTIME 270 capsule 3  . estradiol cypionate (DEPO-ESTRADIOL) 5 MG/ML injection Use 0.3 ml every 18-25 days IM 5 mL 3  . furosemide (LASIX) 40 MG tablet TAKE ONE TABLET BY MOUTH TWICE DAILY 180 tablet 3  . hyoscyamine (LEVBID) 0.375 MG 12 hr tablet Take 1 tablet (0.375 mg total) by mouth 2 (two) times daily. 180 tablet 0  . KLOR-CON M20 20 MEQ tablet TAKE ONE TABLET BY MOUTH TWICE DAILY 180 tablet 3  . levothyroxine (SYNTHROID, LEVOTHROID) 125 MCG tablet TAKE ONE TABLET BY MOUTH ONCE DAILY 90 tablet 3  . LORazepam (ATIVAN) 0.5 MG tablet TAKE ONE TABLET BY MOUTH AT BEDTIME 90 tablet 5  . losartan (COZAAR) 50 MG tablet Take 1 tablet (50 mg total) by mouth daily. 90 tablet 3  . metoprolol (LOPRESSOR) 50 MG tablet TAKE ONE TABLET BY MOUTH THREE TIMES DAILY 270 tablet 3  . NIFEdipine (PROCARDIA-XL/ADALAT-CC/NIFEDICAL-XL) 30 MG 24 hr tablet TAKE ONE TABLET BY MOUTH ONCE DAILY 90 tablet 3  . nitroGLYCERIN (NITROLINGUAL) 0.4 MG/SPRAY spray Place 1 spray under the tongue every 5 (five) minutes as needed for chest pain. For chest pain - may repeat x 3 16.9 g 3  . potassium chloride SA (KLOR-CON M20) 20 MEQ tablet Take 1 tablet (20 mEq total) by mouth 2 (two) times daily. 180 tablet 3  . ranitidine (ZANTAC) 300 MG tablet Take 1 tablet (300 mg total) by mouth at bedtime. 90 tablet 3   Facility-Administered Medications  Prior to Visit  Medication Dose Route Frequency Provider Last Rate Last Dose  . estradiol cypionate (DEPO-ESTRADIOL) 5 MG/ML injection 1 mg  1 mg Intramuscular Q30 days Jacinta ShoeAleksei Plotnikov V, MD   1 mg at 01/31/15 0933    ROS Review of Systems  Constitutional: Negative for fatigue.  Eyes: Positive for visual disturbance.  Psychiatric/Behavioral: The patient is nervous/anxious.     Objective:  BP 160/88 mmHg  Pulse 59  Wt 166 lb (75.297 kg)  SpO2 97%  BP Readings from Last 3 Encounters:  10/10/15 160/88  09/08/15 170/90  08/05/15 144/88    Wt Readings from Last 3 Encounters:  10/10/15 166 lb (75.297 kg)  09/08/15 165 lb (74.844 kg)  08/05/15 166 lb (75.297 kg)    Physical Exam  Constitutional: She appears well-developed. No distress.  HENT:  Head: Normocephalic.  Right Ear: External ear normal.  Left Ear: External ear normal.  Nose: Nose normal.  Mouth/Throat: Oropharynx is clear and moist.  Eyes: Conjunctivae are normal. Pupils are equal, round, and reactive to light. Right eye exhibits no discharge. Left eye exhibits no discharge.  Neck: Normal range of motion. Neck supple. No JVD present. No tracheal deviation present. No thyromegaly present.  Cardiovascular: Normal rate, regular rhythm and normal heart sounds.   Pulmonary/Chest: No  stridor. No respiratory distress. She has no wheezes.  Abdominal: Soft. Bowel sounds are normal. She exhibits no distension and no mass. There is no tenderness. There is no rebound and no guarding.  Musculoskeletal: She exhibits no edema or tenderness.  Lymphadenopathy:    She has no cervical adenopathy.  Neurological: She displays normal reflexes. No cranial nerve deficit. She exhibits normal muscle tone. Coordination normal.  Skin: No rash noted. No erythema.  Psychiatric: She has a normal mood and affect. Her behavior is normal. Judgment and thought content normal.    Lab Results  Component Value Date   WBC 7.0 08/12/2013   HGB  12.9 08/12/2013   HCT 38.4 08/12/2013   PLT 286.0 08/12/2013   GLUCOSE 94 08/03/2014   CHOL 264* 01/10/2012   TRIG 197.0* 01/10/2012   HDL 54.70 01/10/2012   LDLDIRECT 188.2 01/10/2012   LDLCALC 76 03/21/2009   ALT 12 08/03/2014   AST 17 08/03/2014   NA 137 08/03/2014   K 4.1 08/03/2014   CL 103 08/03/2014   CREATININE 1.29* 12/24/2014   BUN 18 12/24/2014   CO2 30 08/03/2014   TSH 3.30 08/03/2014   INR 1.09 08/22/2009    US Abdomen Complete  01/05/2015  CLINICAL DATA:  Abdominal pain EXAM: ULTRASOUND ABDOMEN COMPLETE COMPARISON:  CT abdomen and pelvis December 24, 2014 FINDINGS: Gallbladder: No gallstones or wall thickening visualized. There is no pericholecystic fluid. No sonographic Murphy sign noted. Common bile duct: Diameter: 4 mm. There is no intrahepatic, common hepatic, or common bile duct dilatation. Liver: No focal lesion identified. Within normal limits in parenchymal echogenicity. IVC: No abnormality visualized. Pancreas: No mass or inflammatory focus. Spleen: Size and appearance within normal limits. Right Kidney: Length: 9.7 cm. Echogenicity within normal limits. No mass or hydronephrosis visualized. Left Kidney: Length: 11.7 cm. Echogenicity within normal limits. No mass or hydronephrosis visualized. Abdominal aorta: No aneurysm visualized. Atherosclerotic change is noted in the aorta. Other findings: No demonstrable ascites. IMPRESSION: Atherosclerotic change in the aorta without aneurysm. Study otherwise unremarkable. Electronically Signed   By: Bretta Bang III M.D.   On: 01/05/2015 09:46    Assessment & Plan:   There are no diagnoses linked to this encounter. I am having Ms. Boer maintain her aspirin, Cholecalciferol, LORazepam, KLOR-CON M20, NIFEdipine, metoprolol, levothyroxine, furosemide, doxepin, ranitidine, losartan, potassium chloride SA, nitroGLYCERIN, hyoscyamine, clorazepate, and estradiol cypionate. We will continue to administer estradiol cypionate.  No  orders of the defined types were placed in this encounter.     Follow-up: No Follow-up on file.  Sonda Primes, MD

## 2015-10-10 NOTE — Assessment & Plan Note (Signed)
Tranxene prn , Lorazepam prn, Doxepine at hs Pt taking meds at night (not when driving)  Potential benefits of a long term benzodiazepines  use as well as potential risks  and complications were explained to the patient and were aknowledged.  

## 2015-11-03 ENCOUNTER — Ambulatory Visit (INDEPENDENT_AMBULATORY_CARE_PROVIDER_SITE_OTHER): Payer: Medicare Other | Admitting: Internal Medicine

## 2015-11-03 ENCOUNTER — Encounter: Payer: Self-pay | Admitting: Internal Medicine

## 2015-11-03 VITALS — BP 140/90 | HR 57 | Wt 166.0 lb

## 2015-11-03 DIAGNOSIS — Z9849 Cataract extraction status, unspecified eye: Secondary | ICD-10-CM | POA: Diagnosis not present

## 2015-11-03 DIAGNOSIS — H269 Unspecified cataract: Secondary | ICD-10-CM

## 2015-11-03 DIAGNOSIS — I10 Essential (primary) hypertension: Secondary | ICD-10-CM | POA: Diagnosis not present

## 2015-11-03 NOTE — Assessment & Plan Note (Signed)
S/p surgery on one eye

## 2015-11-03 NOTE — Assessment & Plan Note (Signed)
BP is OK at home 

## 2015-11-03 NOTE — Progress Notes (Signed)
Pre visit review using our clinic review tool, if applicable. No additional management support is needed unless otherwise documented below in the visit note. 

## 2015-11-03 NOTE — Progress Notes (Signed)
Subjective:  Patient ID: [name], female    DOB: 05/14/1946  Age: 70 y.o. MRN: [id number]  CC: No chief complaint on file.   HPI [name] StallRuby C [name] presents for HTN f/u. BP ok at home. Pt had a cataract surgery SBP 140 at home  Outpatient Prescriptions Prior to Visit  Medication Sig Dispense Refill  . aspirin 81 MG EC tablet Take 81 mg by mouth daily.      . Cholecalciferol 1000 UNITS tablet Take 1,000 Units by mouth daily.      . clorazepate (TRANXENE) 15 MG tablet Take 1 tablet (15 mg total) by mouth 3 (three) times daily as needed. for anxiety 270 tablet 0  . doxepin (SINEQUAN) 50 MG capsule TAKE THREE CAPSULES BY MOUTH AT BEDTIME 270 capsule 3  . estradiol cypionate (DEPO-ESTRADIOL) 5 MG/ML injection Use 0.3 ml every 18-25 days IM 5 mL 3  . furosemide (LASIX) 40 MG tablet TAKE ONE TABLET BY MOUTH TWICE DAILY 180 tablet 3  . hyoscyamine (LEVBID) 0.375 MG 12 hr tablet Take 1 tablet (0.375 mg total) by mouth 2 (two) times daily. 180 tablet 0  . KLOR-CON M20 20 MEQ tablet TAKE ONE TABLET BY MOUTH TWICE DAILY 180 tablet 3  . levothyroxine (SYNTHROID, LEVOTHROID) 125 MCG tablet TAKE ONE TABLET BY MOUTH ONCE DAILY 90 tablet 3  . LORazepam (ATIVAN) 0.5 MG tablet TAKE ONE TABLET BY MOUTH AT BEDTIME 90 tablet 5  . losartan (COZAAR) 50 MG tablet Take 1 tablet (50 mg total) by mouth daily. 90 tablet 3  . metoprolol (LOPRESSOR) 50 MG tablet TAKE ONE TABLET BY MOUTH THREE TIMES DAILY 270 tablet 3  . NIFEdipine (PROCARDIA-XL/ADALAT-CC/NIFEDICAL-XL) 30 MG 24 hr tablet TAKE ONE TABLET BY MOUTH ONCE DAILY 90 tablet 3  . nitroGLYCERIN (NITROLINGUAL) 0.4 MG/SPRAY spray Place 1 spray under the tongue every 5 (five) minutes as needed for chest pain. For chest pain - may repeat x 3 16.9 g 3  . potassium chloride SA (KLOR-CON M20) 20 MEQ tablet Take 1 tablet (20 mEq total) by mouth 2 (two) times daily. 180 tablet 3  . ranitidine (ZANTAC) 300 MG tablet Take 1 tablet (300 mg total) by mouth at bedtime. 90  tablet 3   Facility-Administered Medications Prior to Visit  Medication Dose Route Frequency Provider Last Rate Last Dose  . estradiol cypionate (DEPO-ESTRADIOL) 5 MG/ML injection 1 mg  1 mg Intramuscular Q30 days Tresa GarterAleksei V Plotnikov, MD   1 mg at 01/31/15 0933    ROS Review of Systems  Constitutional: Negative for chills, activity change, appetite change, fatigue and unexpected weight change.  HENT: Negative for congestion, mouth sores and sinus pressure.   Eyes: Negative for visual disturbance.  Respiratory: Negative for cough and chest tightness.   Gastrointestinal: Negative for nausea and abdominal pain.  Genitourinary: Negative for frequency, difficulty urinating and vaginal pain.  Musculoskeletal: Negative for back pain and gait problem.  Skin: Negative for pallor and rash.  Neurological: Negative for dizziness, tremors, weakness, numbness and headaches.  Psychiatric/Behavioral: Negative for confusion and sleep disturbance.    Objective:  BP 180/90 mmHg  Pulse 57  Wt 166 lb (75.297 kg)  SpO2 95%  BP Readings from Last 3 Encounters:  11/03/15 180/90  10/10/15 160/88  09/08/15 170/90    Wt Readings from Last 3 Encounters:  11/03/15 166 lb (75.297 kg)  10/10/15 166 lb (75.297 kg)  09/08/15 165 lb (74.844 kg)    Physical Exam  Constitutional: No distress.  Cardiovascular:  No murmur heard. Pulmonary/Chest: No respiratory distress. She has no rales.  Skin: No rash noted. No erythema.    Lab Results  Component Value Date   WBC 7.0 08/12/2013   HGB 12.9 08/12/2013   HCT 38.4 08/12/2013   PLT 286.0 08/12/2013   GLUCOSE 94 08/03/2014   CHOL 264* 01/10/2012   TRIG 197.0* 01/10/2012   HDL 54.70 01/10/2012   LDLDIRECT 188.2 01/10/2012   LDLCALC 76 03/21/2009   ALT 12 08/03/2014   AST 17 08/03/2014   NA 137 08/03/2014   K 4.1 08/03/2014   CL 103 08/03/2014   CREATININE 1.29* 12/24/2014   BUN 18 12/24/2014   CO2 30 08/03/2014   TSH 3.30 08/03/2014   INR  1.09 08/22/2009    US Abdomen Complete  01/05/2015  CLINICAL DATA:  Abdominal pain EXAM: ULTRASOUND ABDOMEN COMPLETE COMPARISON:  CT abdomen and pelvis December 24, 2014 FINDINGS: Gallbladder: No gallstones or wall thickening visualized. There is no pericholecystic fluid. No sonographic Murphy sign noted. Common bile duct: Diameter: 4 mm. There is no intrahepatic, common hepatic, or common bile duct dilatation. Liver: No focal lesion identified. Within normal limits in parenchymal echogenicity. IVC: No abnormality visualized. Pancreas: No mass or inflammatory focus. Spleen: Size and appearance within normal limits. Right Kidney: Length: 9.7 cm. Echogenicity within normal limits. No mass or hydronephrosis visualized. Left Kidney: Length: 11.7 cm. Echogenicity within normal limits. No mass or hydronephrosis visualized. Abdominal aorta: No aneurysm visualized. Atherosclerotic change is noted in the aorta. Other findings: No demonstrable ascites. IMPRESSION: Atherosclerotic change in the aorta without aneurysm. Study otherwise unremarkable. Electronically Signed   By: Bretta Bang III M.D.   On: 01/05/2015 09:46    Assessment & Plan:   There are no diagnoses linked to this encounter. I am having Ms. Vanpelt maintain her aspirin, Cholecalciferol, LORazepam, KLOR-CON M20, NIFEdipine, metoprolol, levothyroxine, furosemide, doxepin, ranitidine, losartan, potassium chloride SA, nitroGLYCERIN, hyoscyamine, clorazepate, and estradiol cypionate. We will continue to administer estradiol cypionate.  No orders of the defined types were placed in this encounter.     Follow-up: No Follow-up on file.  Sonda Primes, MD

## 2015-11-07 ENCOUNTER — Ambulatory Visit: Payer: Medicare Other | Admitting: Internal Medicine

## 2015-11-25 ENCOUNTER — Other Ambulatory Visit: Payer: Self-pay | Admitting: Internal Medicine

## 2015-11-30 ENCOUNTER — Telehealth: Payer: Self-pay | Admitting: Emergency Medicine

## 2015-11-30 MED ORDER — LORAZEPAM 0.5 MG PO TABS: 0.5000 mg | ORAL_TABLET | Freq: Every day | ORAL | Status: DC

## 2015-11-30 NOTE — Telephone Encounter (Signed)
OK to fill this prescription with additional refills x3 Thank you!  

## 2015-11-30 NOTE — Telephone Encounter (Signed)
Request came through 11/25/15, and was routed to MD we care still waiting on his approval. Dr. Posey ReaPlotnikov is pt Lorazepam ok to fill...Raechel Chute/lmb

## 2015-11-30 NOTE — Telephone Encounter (Signed)
Patient called and needs her prescription LORazepam (ATIVAN) 0.5 MG tablet refilled. [facility] stated they didn't receive the prescription. Please follow up thanks.

## 2015-11-30 NOTE — Telephone Encounter (Signed)
Refill called into [facility] spoke w/David gave md approval.../lmb

## 2015-12-05 ENCOUNTER — Ambulatory Visit (INDEPENDENT_AMBULATORY_CARE_PROVIDER_SITE_OTHER): Payer: Medicare Other | Admitting: Internal Medicine

## 2015-12-05 ENCOUNTER — Encounter: Payer: Self-pay | Admitting: Internal Medicine

## 2015-12-05 VITALS — BP 154/90 | HR 68 | Wt 167.0 lb

## 2015-12-05 DIAGNOSIS — I10 Essential (primary) hypertension: Secondary | ICD-10-CM | POA: Diagnosis not present

## 2015-12-05 NOTE — Assessment & Plan Note (Addendum)
Toprol, Nifedipine Re-start Losartan 50 mg/day

## 2015-12-05 NOTE — Progress Notes (Signed)
Subjective:  Patient ID: [name], female    DOB: 09/10/1945  Age: 70 y.o. MRN: [id number]  CC: No chief complaint on file.   HPI TANGEE MARSZALEK presents for HTN f/u  Outpatient Prescriptions Prior to Visit  Medication Sig Dispense Refill  . aspirin 81 MG EC tablet Take 81 mg by mouth daily.      . Cholecalciferol 1000 UNITS tablet Take 1,000 Units by mouth daily.      . clorazepate (TRANXENE) 15 MG tablet Take 1 tablet (15 mg total) by mouth 3 (three) times daily as needed. for anxiety 270 tablet 0  . doxepin (SINEQUAN) 50 MG capsule TAKE THREE CAPSULES BY MOUTH AT BEDTIME 270 capsule 3  . estradiol cypionate (DEPO-ESTRADIOL) 5 MG/ML injection Use 0.3 ml every 18-25 days IM 5 mL 3  . furosemide (LASIX) 40 MG tablet TAKE ONE TABLET BY MOUTH TWICE DAILY 180 tablet 3  . levothyroxine (SYNTHROID, LEVOTHROID) 125 MCG tablet TAKE ONE TABLET BY MOUTH ONCE DAILY 90 tablet 3  . LORazepam (ATIVAN) 0.5 MG tablet Take 1 tablet (0.5 mg total) by mouth at bedtime. 90 tablet 1  . losartan (COZAAR) 50 MG tablet Take 1 tablet (50 mg total) by mouth daily. 90 tablet 3  . metoprolol (LOPRESSOR) 50 MG tablet TAKE ONE TABLET BY MOUTH THREE TIMES DAILY 270 tablet 3  . NIFEdipine (PROCARDIA-XL/ADALAT-CC/NIFEDICAL-XL) 30 MG 24 hr tablet TAKE ONE TABLET BY MOUTH ONCE DAILY 90 tablet 3  . nitroGLYCERIN (NITROLINGUAL) 0.4 MG/SPRAY spray Place 1 spray under the tongue every 5 (five) minutes as needed for chest pain. For chest pain - may repeat x 3 16.9 g 3  . potassium chloride SA (KLOR-CON M20) 20 MEQ tablet Take 1 tablet (20 mEq total) by mouth 2 (two) times daily. 180 tablet 3  . hyoscyamine (LEVBID) 0.375 MG 12 hr tablet Take 1 tablet (0.375 mg total) by mouth 2 (two) times daily. (Patient not taking: Reported on 12/05/2015) 180 tablet 0  . ranitidine (ZANTAC) 300 MG tablet Take 1 tablet (300 mg total) by mouth at bedtime. (Patient not taking: Reported on 12/05/2015) 90 tablet 3  . KLOR-CON M20 20 MEQ tablet  TAKE ONE TABLET BY MOUTH TWICE DAILY (Patient not taking: Reported on 12/05/2015) 180 tablet 3   Facility-Administered Medications Prior to Visit  Medication Dose Route Frequency Provider Last Rate Last Dose  . estradiol cypionate (DEPO-ESTRADIOL) 5 MG/ML injection 1 mg  1 mg Intramuscular Q30 days Tresa Garter, MD   1 mg at 01/31/15 0933    ROS Review of Systems  Constitutional: Negative for chills, activity change, appetite change, fatigue and unexpected weight change.  HENT: Negative for congestion, mouth sores and sinus pressure.   Eyes: Negative for visual disturbance.  Respiratory: Negative for cough and chest tightness.   Gastrointestinal: Negative for nausea and abdominal pain.  Genitourinary: Negative for frequency, difficulty urinating and vaginal pain.  Musculoskeletal: Negative for back pain and gait problem.  Skin: Negative for pallor and rash.  Neurological: Negative for dizziness, tremors, weakness, numbness and headaches.  Psychiatric/Behavioral: Negative for confusion and sleep disturbance.    Objective:  BP 180/92 mmHg  Pulse 68  Wt 167 lb (75.751 kg)  SpO2 97%  BP Readings from Last 3 Encounters:  12/05/15 180/92  11/03/15 140/90  10/10/15 160/88    Wt Readings from Last 3 Encounters:  12/05/15 167 lb (75.751 kg)  11/03/15 166 lb (75.297 kg)  10/10/15 166 lb (75.297 kg)  Physical Exam  Constitutional: She appears well-developed. No distress.  HENT:  Head: Normocephalic.  Right Ear: External ear normal.  Left Ear: External ear normal.  Nose: Nose normal.  Mouth/Throat: Oropharynx is clear and moist.  Eyes: Conjunctivae are normal. Pupils are equal, round, and reactive to light. Right eye exhibits no discharge. Left eye exhibits no discharge.  Neck: Normal range of motion. Neck supple. No JVD present. No tracheal deviation present. No thyromegaly present.  Cardiovascular: Normal rate, regular rhythm and normal heart sounds.   Pulmonary/Chest:  No stridor. No respiratory distress. She has no wheezes.  Abdominal: Soft. Bowel sounds are normal. She exhibits no distension and no mass. There is no tenderness. There is no rebound and no guarding.  Musculoskeletal: She exhibits no edema or tenderness.  Lymphadenopathy:    She has no cervical adenopathy.  Neurological: She displays normal reflexes. No cranial nerve deficit. She exhibits normal muscle tone. Coordination normal.  Skin: No rash noted. No erythema.  Psychiatric: She has a normal mood and affect. Her behavior is normal. Judgment and thought content normal.    Lab Results  Component Value Date   WBC 7.0 08/12/2013   HGB 12.9 08/12/2013   HCT 38.4 08/12/2013   PLT 286.0 08/12/2013   GLUCOSE 94 08/03/2014   CHOL 264* 01/10/2012   TRIG 197.0* 01/10/2012   HDL 54.70 01/10/2012   LDLDIRECT 188.2 01/10/2012   LDLCALC 76 03/21/2009   ALT 12 08/03/2014   AST 17 08/03/2014   NA 137 08/03/2014   K 4.1 08/03/2014   CL 103 08/03/2014   CREATININE 1.29* 12/24/2014   BUN 18 12/24/2014   CO2 30 08/03/2014   TSH 3.30 08/03/2014   INR 1.09 08/22/2009    [place] Abdomen Complete  01/05/2015  CLINICAL DATA:  Abdominal pain EXAM: ULTRASOUND ABDOMEN COMPLETE COMPARISON:  CT abdomen and pelvis December 24, 2014 FINDINGS: Gallbladder: No gallstones or wall thickening visualized. There is no pericholecystic fluid. No sonographic Murphy sign noted. Common bile duct: Diameter: 4 mm. There is no intrahepatic, common hepatic, or common bile duct dilatation. Liver: No focal lesion identified. Within normal limits in parenchymal echogenicity. IVC: No abnormality visualized. Pancreas: No mass or inflammatory focus. Spleen: Size and appearance within normal limits. Right Kidney: Length: 9.7 cm. Echogenicity within normal limits. No mass or hydronephrosis visualized. Left Kidney: Length: 11.7 cm. Echogenicity within normal limits. No mass or hydronephrosis visualized. Abdominal aorta: No aneurysm visualized.  Atherosclerotic change is noted in the aorta. Other findings: No demonstrable ascites. IMPRESSION: Atherosclerotic change in the aorta without aneurysm. Study otherwise unremarkable. Electronically Signed   By: Bretta BangWilliam  Woodruff III M.D.   On: 01/05/2015 09:46    Assessment & Plan:   There are no diagnoses linked to this encounter. I have discontinued Ms. Benevides's KLOR-CON M20. I am also having her maintain her aspirin, Cholecalciferol, NIFEdipine, metoprolol, levothyroxine, furosemide, doxepin, ranitidine, losartan, potassium chloride SA, nitroGLYCERIN, hyoscyamine, clorazepate, estradiol cypionate, and LORazepam. We will continue to administer estradiol cypionate.  No orders of the defined types were placed in this encounter.     Follow-up: No Follow-up on file.  Sonda PrimesAlex Plotnikov, MD

## 2015-12-05 NOTE — Progress Notes (Signed)
Pre visit review using our clinic review tool, if applicable. No additional management support is needed unless otherwise documented below in the visit note. 

## 2015-12-29 ENCOUNTER — Encounter: Payer: Self-pay | Admitting: Internal Medicine

## 2015-12-29 ENCOUNTER — Other Ambulatory Visit: Payer: Self-pay | Admitting: Internal Medicine

## 2015-12-29 DIAGNOSIS — E278 Other specified disorders of adrenal gland: Secondary | ICD-10-CM | POA: Insufficient documentation

## 2015-12-29 DIAGNOSIS — R935 Abnormal findings on diagnostic imaging of other abdominal regions, including retroperitoneum: Secondary | ICD-10-CM

## 2015-12-30 ENCOUNTER — Telehealth: Payer: Self-pay | Admitting: Internal Medicine

## 2015-12-30 ENCOUNTER — Other Ambulatory Visit: Payer: Self-pay | Admitting: Internal Medicine

## 2015-12-30 DIAGNOSIS — I10 Essential (primary) hypertension: Secondary | ICD-10-CM

## 2015-12-30 DIAGNOSIS — E278 Other specified disorders of adrenal gland: Secondary | ICD-10-CM

## 2015-12-30 NOTE — Telephone Encounter (Signed)
Dr Yetta BarreJones pt need labs bun and creatine. 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 Pt is scheduled for 01/03/16  @ 1:30pm .

## 2015-12-30 NOTE — Telephone Encounter (Signed)
Labs ordered.

## 2016-01-03 ENCOUNTER — Inpatient Hospital Stay: Admission: RE | Admit: 2016-01-03 | Payer: Medicare Other | Source: Ambulatory Visit

## 2016-01-04 ENCOUNTER — Ambulatory Visit (INDEPENDENT_AMBULATORY_CARE_PROVIDER_SITE_OTHER): Payer: Medicare Other | Admitting: Internal Medicine

## 2016-01-04 ENCOUNTER — Encounter: Payer: Self-pay | Admitting: Internal Medicine

## 2016-01-04 DIAGNOSIS — E038 Other specified hypothyroidism: Secondary | ICD-10-CM | POA: Diagnosis not present

## 2016-01-04 DIAGNOSIS — I10 Essential (primary) hypertension: Secondary | ICD-10-CM | POA: Diagnosis not present

## 2016-01-04 DIAGNOSIS — I2510 Atherosclerotic heart disease of native coronary artery without angina pectoris: Secondary | ICD-10-CM | POA: Diagnosis not present

## 2016-01-04 DIAGNOSIS — N9489 Other specified conditions associated with female genital organs and menstrual cycle: Secondary | ICD-10-CM

## 2016-01-04 DIAGNOSIS — N949 Unspecified condition associated with female genital organs and menstrual cycle: Secondary | ICD-10-CM

## 2016-01-04 DIAGNOSIS — E034 Atrophy of thyroid (acquired): Secondary | ICD-10-CM

## 2016-01-04 NOTE — Assessment & Plan Note (Signed)
levothroid  

## 2016-01-04 NOTE — Assessment & Plan Note (Addendum)
  Toprol, Nifedipine  Losartan 50 mg/day if BPs are >140/90

## 2016-01-04 NOTE — Progress Notes (Signed)
Pre visit review using our clinic review tool, if applicable. No additional management support is needed unless otherwise documented below in the visit note. 

## 2016-01-04 NOTE — Progress Notes (Signed)
Subjective:  Patient ID: [name], female    DOB: 12/15/1945  Age: 70 y.o. MRN: [id number]  CC: No chief complaint on file.   HPI [name] StallRuby C [name] presents for L eye infection - better; and HTN, adrenal nodule f/u. F/u abn abd CT findings  Outpatient Medications Prior to Visit  Medication Sig Dispense Refill  . aspirin 81 MG EC tablet Take 81 mg by mouth daily.      . Cholecalciferol 1000 UNITS tablet Take 1,000 Units by mouth daily.      . clorazepate (TRANXENE) 15 MG tablet Take 1 tablet (15 mg total) by mouth 3 (three) times daily as needed. for anxiety 270 tablet 0  . doxepin (SINEQUAN) 50 MG capsule TAKE THREE CAPSULES BY MOUTH AT BEDTIME 270 capsule 3  . estradiol cypionate (DEPO-ESTRADIOL) 5 MG/ML injection Use 0.3 ml every 18-25 days IM 5 mL 3  . furosemide (LASIX) 40 MG tablet TAKE ONE TABLET BY MOUTH TWICE DAILY 180 tablet 3  . hyoscyamine (LEVBID) 0.375 MG 12 hr tablet Take 1 tablet (0.375 mg total) by mouth 2 (two) times daily. 180 tablet 0  . levothyroxine (SYNTHROID, LEVOTHROID) 125 MCG tablet TAKE ONE TABLET BY MOUTH ONCE DAILY 90 tablet 3  . LORazepam (ATIVAN) 0.5 MG tablet Take 1 tablet (0.5 mg total) by mouth at bedtime. 90 tablet 1  . losartan (COZAAR) 50 MG tablet Take 1 tablet (50 mg total) by mouth daily. 90 tablet 3  . metoprolol (LOPRESSOR) 50 MG tablet TAKE ONE TABLET BY MOUTH THREE TIMES DAILY 270 tablet 3  . NIFEdipine (PROCARDIA-XL/ADALAT-CC/NIFEDICAL-XL) 30 MG 24 hr tablet TAKE ONE TABLET BY MOUTH ONCE DAILY 90 tablet 3  . nitroGLYCERIN (NITROLINGUAL) 0.4 MG/SPRAY spray Place 1 spray under the tongue every 5 (five) minutes as needed for chest pain. For chest pain - may repeat x 3 16.9 g 3  . potassium chloride SA (KLOR-CON M20) 20 MEQ tablet Take 1 tablet (20 mEq total) by mouth 2 (two) times daily. 180 tablet 3  . ranitidine (ZANTAC) 300 MG tablet Take 1 tablet (300 mg total) by mouth at bedtime. 90 tablet 3   Facility-Administered Medications Prior to Visit    Medication Dose Route Frequency Provider Last Rate Last Dose  . estradiol cypionate (DEPO-ESTRADIOL) 5 MG/ML injection 1 mg  1 mg Intramuscular Q30 days Tresa GarterAleksei V Plotnikov, MD   1 mg at 01/31/15 0933    ROS Review of Systems  Constitutional: Negative for activity change, appetite change, chills, fatigue and unexpected weight change.  HENT: Negative for congestion, mouth sores and sinus pressure.   Eyes: Negative for visual disturbance.  Respiratory: Negative for cough and chest tightness.   Gastrointestinal: Negative for abdominal pain and nausea.  Genitourinary: Negative for difficulty urinating, frequency and vaginal pain.  Musculoskeletal: Negative for back pain and gait problem.  Skin: Negative for pallor and rash.  Neurological: Negative for dizziness, tremors, weakness, numbness and headaches.  Psychiatric/Behavioral: Negative for confusion and sleep disturbance. The patient is nervous/anxious.     Objective:  BP (!) 160/94   Pulse 70   Wt 167 lb (75.8 kg)   SpO2 97%   BMI 25.39 kg/m   BP Readings from Last 3 Encounters:  01/04/16 (!) 160/94  12/05/15 (!) 154/90  11/03/15 140/90    Wt Readings from Last 3 Encounters:  01/04/16 167 lb (75.8 kg)  12/05/15 167 lb (75.8 kg)  11/03/15 166 lb (75.3 kg)    Physical Exam  Constitutional: She  appears well-developed. No distress.  HENT:  Head: Normocephalic.  Right Ear: External ear normal.  Left Ear: External ear normal.  Nose: Nose normal.  Mouth/Throat: Oropharynx is clear and moist.  Eyes: Conjunctivae are normal. Pupils are equal, round, and reactive to light. Right eye exhibits no discharge. Left eye exhibits no discharge.  Neck: Normal range of motion. Neck supple. No JVD present. No tracheal deviation present. No thyromegaly present.  Cardiovascular: Normal rate, regular rhythm and normal heart sounds.   Pulmonary/Chest: No stridor. No respiratory distress. She has no wheezes.  Abdominal: Soft. Bowel sounds  are normal. She exhibits no distension and no mass. There is no tenderness. There is no rebound and no guarding.  Musculoskeletal: She exhibits no edema or tenderness.  Lymphadenopathy:    She has no cervical adenopathy.  Neurological: She displays normal reflexes. No cranial nerve deficit. She exhibits normal muscle tone. Coordination normal.  Skin: No rash noted. No erythema.  Psychiatric: She has a normal mood and affect. Her behavior is normal. Judgment and thought content normal.    Lab Results  Component Value Date   WBC 7.0 08/12/2013   HGB 12.9 08/12/2013   HCT 38.4 08/12/2013   PLT 286.0 08/12/2013   GLUCOSE 94 08/03/2014   CHOL 264 (H) 01/10/2012   TRIG 197.0 (H) 01/10/2012   HDL 54.70 01/10/2012   LDLDIRECT 188.2 01/10/2012   LDLCALC 76 03/21/2009   ALT 12 08/03/2014   AST 17 08/03/2014   NA 137 08/03/2014   K 4.1 08/03/2014   CL 103 08/03/2014   CREATININE 1.29 (H) 12/24/2014   BUN 18 12/24/2014   CO2 30 08/03/2014   TSH 3.30 08/03/2014   INR 1.09 08/22/2009    [place] Abdomen Complete  Result Date: 01/05/2015 CLINICAL DATA:  Abdominal pain EXAM: ULTRASOUND ABDOMEN COMPLETE COMPARISON:  CT abdomen and pelvis December 24, 2014 FINDINGS: Gallbladder: No gallstones or wall thickening visualized. There is no pericholecystic fluid. No sonographic Murphy sign noted. Common bile duct: Diameter: 4 mm. There is no intrahepatic, common hepatic, or common bile duct dilatation. Liver: No focal lesion identified. Within normal limits in parenchymal echogenicity. IVC: No abnormality visualized. Pancreas: No mass or inflammatory focus. Spleen: Size and appearance within normal limits. Right Kidney: Length: 9.7 cm. Echogenicity within normal limits. No mass or hydronephrosis visualized. Left Kidney: Length: 11.7 cm. Echogenicity within normal limits. No mass or hydronephrosis visualized. Abdominal aorta: No aneurysm visualized. Atherosclerotic change is noted in the aorta. Other findings: No  demonstrable ascites. IMPRESSION: Atherosclerotic change in the aorta without aneurysm. Study otherwise unremarkable. Electronically Signed   By: Bretta BangWilliam  Woodruff III M.D.   On: 01/05/2015 09:46    Assessment & Plan:   There are no diagnoses linked to this encounter. I am having Ms. Dorothyann GibbsFinch maintain her aspirin, Cholecalciferol, NIFEdipine, metoprolol, levothyroxine, furosemide, doxepin, ranitidine, losartan, potassium chloride SA, nitroGLYCERIN, hyoscyamine, clorazepate, estradiol cypionate, and LORazepam. We will continue to administer estradiol cypionate.  No orders of the defined types were placed in this encounter.    Follow-up: No Follow-up on file.  Sonda PrimesAlex Plotnikov, MD

## 2016-01-04 NOTE — Assessment & Plan Note (Signed)
Toprol, Procardia 

## 2016-01-04 NOTE — Assessment & Plan Note (Signed)
Low-density adnexal lesions measuring up to 2.6 cm on the right. These demonstrate no aggressive characteristics and are likely benign. However, given the patient's postmenopausal status, follow up imaging in 1 year suggested to document stability.   CT ordered

## 2016-01-08 ENCOUNTER — Encounter: Payer: Self-pay | Admitting: Internal Medicine

## 2016-01-30 ENCOUNTER — Other Ambulatory Visit: Payer: Self-pay | Admitting: Internal Medicine

## 2016-01-31 ENCOUNTER — Other Ambulatory Visit: Payer: Self-pay | Admitting: Internal Medicine

## 2016-01-31 DIAGNOSIS — I10 Essential (primary) hypertension: Secondary | ICD-10-CM

## 2016-01-31 DIAGNOSIS — N9489 Other specified conditions associated with female genital organs and menstrual cycle: Secondary | ICD-10-CM

## 2016-01-31 DIAGNOSIS — E278 Other specified disorders of adrenal gland: Secondary | ICD-10-CM

## 2016-01-31 DIAGNOSIS — E039 Hypothyroidism, unspecified: Secondary | ICD-10-CM

## 2016-02-01 ENCOUNTER — Telehealth: Payer: Self-pay | Admitting: Internal Medicine

## 2016-02-01 NOTE — Telephone Encounter (Signed)
OK. Thx

## 2016-02-01 NOTE — Telephone Encounter (Signed)
FYI Ms. [name] would like to wait a while longer for her follow up CT scan. I told her I'll call her back in about a month to see if she is ready. She agreed.

## 2016-02-03 ENCOUNTER — Encounter: Payer: Self-pay | Admitting: Internal Medicine

## 2016-02-03 ENCOUNTER — Ambulatory Visit (INDEPENDENT_AMBULATORY_CARE_PROVIDER_SITE_OTHER): Payer: Medicare Other | Admitting: Internal Medicine

## 2016-02-03 DIAGNOSIS — I10 Essential (primary) hypertension: Secondary | ICD-10-CM

## 2016-02-03 DIAGNOSIS — N951 Menopausal and female climacteric states: Secondary | ICD-10-CM | POA: Diagnosis not present

## 2016-02-03 NOTE — Progress Notes (Signed)
Subjective:  Patient ID: [name] StallRuby C [name], female    DOB: 12/31/1945  Age: 70 y.o. MRN: [id number]  CC: No chief complaint on file.   HPI [name] presents for HTN. BP ok at home  Outpatient Medications Prior to Visit  Medication Sig Dispense Refill  . aspirin 81 MG EC tablet Take 81 mg by mouth daily.      . Cholecalciferol 1000 UNITS tablet Take 1,000 Units by mouth daily.      . clorazepate (TRANXENE) 15 MG tablet Take 1 tablet (15 mg total) by mouth 3 (three) times daily as needed. for anxiety 270 tablet 0  . doxepin (SINEQUAN) 50 MG capsule TAKE THREE CAPSULES BY MOUTH AT BEDTIME 270 capsule 3  . estradiol cypionate (DEPO-ESTRADIOL) 5 MG/ML injection Use 0.3 ml every 18-25 days IM 5 mL 3  . furosemide (LASIX) 40 MG tablet TAKE ONE TABLET BY MOUTH TWICE DAILY 180 tablet 3  . hyoscyamine (LEVBID) 0.375 MG 12 hr tablet Take 1 tablet (0.375 mg total) by mouth 2 (two) times daily. 180 tablet 0  . levothyroxine (SYNTHROID, LEVOTHROID) 125 MCG tablet TAKE ONE TABLET BY MOUTH ONCE DAILY 90 tablet 3  . LORazepam (ATIVAN) 0.5 MG tablet Take 1 tablet (0.5 mg total) by mouth at bedtime. 90 tablet 1  . losartan (COZAAR) 50 MG tablet Take 1 tablet (50 mg total) by mouth daily. 90 tablet 3  . metoprolol (LOPRESSOR) 50 MG tablet TAKE ONE TABLET BY MOUTH THREE TIMES DAILY 270 tablet 3  . NIFEdipine (PROCARDIA-XL/ADALAT-CC/NIFEDICAL-XL) 30 MG 24 hr tablet TAKE ONE TABLET BY MOUTH ONCE DAILY 90 tablet 3  . nitroGLYCERIN (NITROLINGUAL) 0.4 MG/SPRAY spray Place 1 spray under the tongue every 5 (five) minutes as needed for chest pain. For chest pain - may repeat x 3 16.9 g 3  . potassium chloride SA (KLOR-CON M20) 20 MEQ tablet Take 1 tablet (20 mEq total) by mouth 2 (two) times daily. 180 tablet 3  . ranitidine (ZANTAC) 300 MG tablet Take 1 tablet (300 mg total) by mouth at bedtime. 90 tablet 3   Facility-Administered Medications Prior to Visit  Medication Dose Route Frequency Provider Last Rate Last  Dose  . estradiol cypionate (DEPO-ESTRADIOL) 5 MG/ML injection 1 mg  1 mg Intramuscular Q30 days Tresa GarterAleksei V Plotnikov, MD   1 mg at 01/31/15 0933    ROS Review of Systems  Constitutional: Negative for unexpected weight change.  Cardiovascular: Negative for chest pain and palpitations.    Objective:  BP (!) 148/90   Pulse 63   Wt 164 lb (74.4 kg)   SpO2 96%   BMI 24.94 kg/m   BP Readings from Last 3 Encounters:  02/03/16 (!) 148/90  01/04/16 (!) 160/94  12/05/15 (!) 154/90    Wt Readings from Last 3 Encounters:  02/03/16 164 lb (74.4 kg)  01/04/16 167 lb (75.8 kg)  12/05/15 167 lb (75.8 kg)    Physical Exam  Constitutional: She appears well-developed. No distress.  HENT:  Head: Normocephalic.  Right Ear: External ear normal.  Left Ear: External ear normal.  Nose: Nose normal.  Mouth/Throat: Oropharynx is clear and moist.  Eyes: Conjunctivae are normal. Pupils are equal, round, and reactive to light. Right eye exhibits no discharge. Left eye exhibits no discharge.  Neck: Normal range of motion. Neck supple. No JVD present. No tracheal deviation present. No thyromegaly present.  Cardiovascular: Normal rate, regular rhythm and normal heart sounds.   Pulmonary/Chest: No stridor. No respiratory distress. She  has no wheezes.  Abdominal: Soft. Bowel sounds are normal. She exhibits no distension and no mass. There is no tenderness. There is no rebound and no guarding.  Musculoskeletal: She exhibits no edema or tenderness.  Lymphadenopathy:    She has no cervical adenopathy.  Neurological: She displays normal reflexes. No cranial nerve deficit. She exhibits normal muscle tone. Coordination normal.  Skin: No rash noted. No erythema.  Psychiatric: She has a normal mood and affect. Her behavior is normal. Judgment and thought content normal.    Lab Results  Component Value Date   WBC 7.0 08/12/2013   HGB 12.9 08/12/2013   HCT 38.4 08/12/2013   PLT 286.0 08/12/2013   GLUCOSE  94 08/03/2014   CHOL 264 (H) 01/10/2012   TRIG 197.0 (H) 01/10/2012   HDL 54.70 01/10/2012   LDLDIRECT 188.2 01/10/2012   LDLCALC 76 03/21/2009   ALT 12 08/03/2014   AST 17 08/03/2014   NA 137 08/03/2014   K 4.1 08/03/2014   CL 103 08/03/2014   CREATININE 1.29 (H) 12/24/2014   BUN 18 12/24/2014   CO2 30 08/03/2014   TSH 3.30 08/03/2014   INR 1.09 08/22/2009    US Abdomen Complete  Result Date: 01/05/2015 CLINICAL DATA:  Abdominal pain EXAM: ULTRASOUND ABDOMEN COMPLETE COMPARISON:  CT abdomen and pelvis December 24, 2014 FINDINGS: Gallbladder: No gallstones or wall thickening visualized. There is no pericholecystic fluid. No sonographic Murphy sign noted. Common bile duct: Diameter: 4 mm. There is no intrahepatic, common hepatic, or common bile duct dilatation. Liver: No focal lesion identified. Within normal limits in parenchymal echogenicity. IVC: No abnormality visualized. Pancreas: No mass or inflammatory focus. Spleen: Size and appearance within normal limits. Right Kidney: Length: 9.7 cm. Echogenicity within normal limits. No mass or hydronephrosis visualized. Left Kidney: Length: 11.7 cm. Echogenicity within normal limits. No mass or hydronephrosis visualized. Abdominal aorta: No aneurysm visualized. Atherosclerotic change is noted in the aorta. Other findings: No demonstrable ascites. IMPRESSION: Atherosclerotic change in the aorta without aneurysm. Study otherwise unremarkable. Electronically Signed   By: Bretta Bang III M.D.   On: 01/05/2015 09:46    Assessment & Plan:   There are no diagnoses linked to this encounter. I am having Ms. Berryman maintain her aspirin, Cholecalciferol, NIFEdipine, metoprolol, levothyroxine, furosemide, doxepin, ranitidine, losartan, potassium chloride SA, nitroGLYCERIN, hyoscyamine, clorazepate, estradiol cypionate, and LORazepam. We will continue to administer estradiol cypionate.  No orders of the defined types were placed in this  encounter.    Follow-up: No Follow-up on file.  Sonda Primes, MD

## 2016-02-03 NOTE — Assessment & Plan Note (Signed)
On shots 

## 2016-02-03 NOTE — Progress Notes (Signed)
Pre visit review using our clinic review tool, if applicable. No additional management support is needed unless otherwise documented below in the visit note. 

## 2016-02-09 ENCOUNTER — Other Ambulatory Visit: Payer: Medicare Other

## 2016-02-29 ENCOUNTER — Encounter: Payer: Self-pay | Admitting: Internal Medicine

## 2016-02-29 ENCOUNTER — Ambulatory Visit (INDEPENDENT_AMBULATORY_CARE_PROVIDER_SITE_OTHER): Payer: Medicare Other | Admitting: Internal Medicine

## 2016-02-29 DIAGNOSIS — F329 Major depressive disorder, single episode, unspecified: Secondary | ICD-10-CM | POA: Diagnosis not present

## 2016-02-29 DIAGNOSIS — I10 Essential (primary) hypertension: Secondary | ICD-10-CM

## 2016-02-29 DIAGNOSIS — E034 Atrophy of thyroid (acquired): Secondary | ICD-10-CM

## 2016-02-29 DIAGNOSIS — F419 Anxiety disorder, unspecified: Secondary | ICD-10-CM

## 2016-02-29 DIAGNOSIS — N951 Menopausal and female climacteric states: Secondary | ICD-10-CM

## 2016-02-29 MED ORDER — METOPROLOL TARTRATE 50 MG PO TABS: 50.0000 mg | ORAL_TABLET | Freq: Three times a day (TID) | ORAL | 3 refills | Status: DC

## 2016-02-29 MED ORDER — DOXEPIN HCL 50 MG PO CAPS: 150.0000 mg | ORAL_CAPSULE | Freq: Every day | ORAL | 3 refills | Status: DC

## 2016-02-29 MED ORDER — LEVOTHYROXINE SODIUM 125 MCG PO TABS: 125.0000 ug | ORAL_TABLET | Freq: Every day | ORAL | 3 refills | Status: DC

## 2016-02-29 MED ORDER — LOSARTAN POTASSIUM 50 MG PO TABS: 50.0000 mg | ORAL_TABLET | Freq: Every day | ORAL | 3 refills | Status: DC

## 2016-02-29 MED ORDER — POTASSIUM CHLORIDE CRYS ER 20 MEQ PO TBCR: 20.0000 meq | EXTENDED_RELEASE_TABLET | Freq: Two times a day (BID) | ORAL | 3 refills | Status: DC

## 2016-02-29 MED ORDER — CLORAZEPATE DIPOTASSIUM 15 MG PO TABS: 15.0000 mg | ORAL_TABLET | Freq: Three times a day (TID) | ORAL | 0 refills | Status: DC | PRN

## 2016-02-29 MED ORDER — NIFEDIPINE ER OSMOTIC RELEASE 30 MG PO TB24: 30.0000 mg | ORAL_TABLET | Freq: Every day | ORAL | 3 refills | Status: DC

## 2016-02-29 MED ORDER — LORAZEPAM 0.5 MG PO TABS: 0.5000 mg | ORAL_TABLET | Freq: Every day | ORAL | 1 refills | Status: DC

## 2016-02-29 MED ORDER — FUROSEMIDE 40 MG PO TABS: 40.0000 mg | ORAL_TABLET | Freq: Two times a day (BID) | ORAL | 3 refills | Status: DC

## 2016-02-29 NOTE — Progress Notes (Signed)
Subjective:  Patient ID: [name], female    DOB: 1946-05-10  Age: 70 y.o. MRN: [id number]  CC: Follow-up (4 week foolow up)   HPI [name] presents for HTN, anxiety, insomnia f/up.  Outpatient Medications Prior to Visit  Medication Sig Dispense Refill  . aspirin 81 MG EC tablet Take 81 mg by mouth daily.      . Cholecalciferol 1000 UNITS tablet Take 1,000 Units by mouth daily.      . clorazepate (TRANXENE) 15 MG tablet Take 1 tablet (15 mg total) by mouth 3 (three) times daily as needed. for anxiety 270 tablet 0  . doxepin (SINEQUAN) 50 MG capsule TAKE THREE CAPSULES BY MOUTH AT BEDTIME 270 capsule 3  . estradiol cypionate (DEPO-ESTRADIOL) 5 MG/ML injection Use 0.3 ml every 18-25 days IM 5 mL 3  . furosemide (LASIX) 40 MG tablet TAKE ONE TABLET BY MOUTH TWICE DAILY 180 tablet 3  . hyoscyamine (LEVBID) 0.375 MG 12 hr tablet Take 1 tablet (0.375 mg total) by mouth 2 (two) times daily. 180 tablet 0  . levothyroxine (SYNTHROID, LEVOTHROID) 125 MCG tablet TAKE ONE TABLET BY MOUTH ONCE DAILY 90 tablet 3  . LORazepam (ATIVAN) 0.5 MG tablet Take 1 tablet (0.5 mg total) by mouth at bedtime. 90 tablet 1  . losartan (COZAAR) 50 MG tablet Take 1 tablet (50 mg total) by mouth daily. 90 tablet 3  . metoprolol (LOPRESSOR) 50 MG tablet TAKE ONE TABLET BY MOUTH THREE TIMES DAILY 270 tablet 3  . NIFEdipine (PROCARDIA-XL/ADALAT-CC/NIFEDICAL-XL) 30 MG 24 hr tablet TAKE ONE TABLET BY MOUTH ONCE DAILY 90 tablet 3  . nitroGLYCERIN (NITROLINGUAL) 0.4 MG/SPRAY spray Place 1 spray under the tongue every 5 (five) minutes as needed for chest pain. For chest pain - may repeat x 3 16.9 g 3  . potassium chloride SA (KLOR-CON M20) 20 MEQ tablet Take 1 tablet (20 mEq total) by mouth 2 (two) times daily. 180 tablet 3  . ranitidine (ZANTAC) 300 MG tablet Take 1 tablet (300 mg total) by mouth at bedtime. 90 tablet 3   Facility-Administered Medications Prior to Visit  Medication Dose Route Frequency Provider Last  Rate Last Dose  . estradiol cypionate (DEPO-ESTRADIOL) 5 MG/ML injection 1 mg  1 mg Intramuscular Q30 days Tresa Garter, MD   1 mg at 01/31/15 0933    ROS Review of Systems  Constitutional: Negative for activity change, appetite change, chills, fatigue and unexpected weight change.  HENT: Negative for congestion, mouth sores and sinus pressure.   Eyes: Negative for visual disturbance.  Respiratory: Negative for cough and chest tightness.   Gastrointestinal: Negative for abdominal pain and nausea.  Genitourinary: Negative for difficulty urinating, frequency and vaginal pain.  Musculoskeletal: Negative for back pain and gait problem.  Skin: Negative for pallor and rash.  Neurological: Negative for dizziness, tremors, weakness, numbness and headaches.  Psychiatric/Behavioral: Positive for sleep disturbance. Negative for confusion. The patient is nervous/anxious.     Objective:  BP 122/80 (BP Location: Left Arm, Patient Position: Sitting, Cuff Size: Normal)   Pulse 86   Temp 97.6 F (36.4 C)   Ht 5\' 8"  (1.727 m)   Wt 165 lb (74.8 kg)   SpO2 98%   BMI 25.09 kg/m   BP Readings from Last 3 Encounters:  02/29/16 122/80  02/03/16 (!) 148/90  01/04/16 (!) 160/94    Wt Readings from Last 3 Encounters:  02/29/16 165 lb (74.8 kg)  02/03/16 164 lb (74.4 kg)  01/04/16  167 lb (75.8 kg)    Physical Exam  Constitutional: She appears well-developed. No distress.  HENT:  Head: Normocephalic.  Right Ear: External ear normal.  Left Ear: External ear normal.  Nose: Nose normal.  Mouth/Throat: Oropharynx is clear and moist.  Eyes: Conjunctivae are normal. Pupils are equal, round, and reactive to light. Right eye exhibits no discharge. Left eye exhibits no discharge.  Neck: Normal range of motion. Neck supple. No JVD present. No tracheal deviation present. No thyromegaly present.  Cardiovascular: Normal rate, regular rhythm and normal heart sounds.   Pulmonary/Chest: No stridor. No  respiratory distress. She has no wheezes.  Abdominal: Soft. Bowel sounds are normal. She exhibits no distension and no mass. There is no tenderness. There is no rebound and no guarding.  Musculoskeletal: She exhibits no edema or tenderness.  Lymphadenopathy:    She has no cervical adenopathy.  Neurological: She displays normal reflexes. No cranial nerve deficit. She exhibits normal muscle tone. Coordination normal.  Skin: No rash noted. No erythema.  Psychiatric: She has a normal mood and affect. Her behavior is normal. Judgment and thought content normal.    Lab Results  Component Value Date   WBC 7.0 08/12/2013   HGB 12.9 08/12/2013   HCT 38.4 08/12/2013   PLT 286.0 08/12/2013   GLUCOSE 94 08/03/2014   CHOL 264 (H) 01/10/2012   TRIG 197.0 (H) 01/10/2012   HDL 54.70 01/10/2012   LDLDIRECT 188.2 01/10/2012   LDLCALC 76 03/21/2009   ALT 12 08/03/2014   AST 17 08/03/2014   NA 137 08/03/2014   K 4.1 08/03/2014   CL 103 08/03/2014   CREATININE 1.29 (H) 12/24/2014   BUN 18 12/24/2014   CO2 30 08/03/2014   TSH 3.30 08/03/2014   INR 1.09 08/22/2009    [place] Abdomen Complete  Result Date: 01/05/2015 CLINICAL DATA:  Abdominal pain EXAM: ULTRASOUND ABDOMEN COMPLETE COMPARISON:  CT abdomen and pelvis December 24, 2014 FINDINGS: Gallbladder: No gallstones or wall thickening visualized. There is no pericholecystic fluid. No sonographic Murphy sign noted. Common bile duct: Diameter: 4 mm. There is no intrahepatic, common hepatic, or common bile duct dilatation. Liver: No focal lesion identified. Within normal limits in parenchymal echogenicity. IVC: No abnormality visualized. Pancreas: No mass or inflammatory focus. Spleen: Size and appearance within normal limits. Right Kidney: Length: 9.7 cm. Echogenicity within normal limits. No mass or hydronephrosis visualized. Left Kidney: Length: 11.7 cm. Echogenicity within normal limits. No mass or hydronephrosis visualized. Abdominal aorta: No aneurysm  visualized. Atherosclerotic change is noted in the aorta. Other findings: No demonstrable ascites. IMPRESSION: Atherosclerotic change in the aorta without aneurysm. Study otherwise unremarkable. Electronically Signed   By: Bretta BangWilliam  Woodruff III M.D.   On: 01/05/2015 09:46    Assessment & Plan:   There are no diagnoses linked to this encounter. I am having [name] maintain her aspirin, Cholecalciferol, NIFEdipine, metoprolol, levothyroxine, furosemide, doxepin, ranitidine, losartan, potassium chloride SA, nitroGLYCERIN, hyoscyamine, clorazepate, estradiol cypionate, and LORazepam. We will continue to administer estradiol cypionate.  No orders of the defined types were placed in this encounter.    Follow-up: No Follow-up on file.  Sonda PrimesAlex Plotnikov, MD

## 2016-02-29 NOTE — Assessment & Plan Note (Signed)
On Levothroid 

## 2016-02-29 NOTE — Progress Notes (Signed)
Pre visit review using our clinic review tool, if applicable. No additional management support is needed unless otherwise documented below in the visit note. 

## 2016-02-29 NOTE — Assessment & Plan Note (Signed)
Doxepine at hs 

## 2016-02-29 NOTE — Assessment & Plan Note (Signed)
.   She would like to cont Depo 0.3 ml  Potential benefits of a long term HRT  use as well as potential risks  and complications were explained to the patient and were aknowledged.

## 2016-02-29 NOTE — Assessment & Plan Note (Signed)
Tranxene prn , Lorazepam prn, Doxepine at hs Pt taking meds at night (not when driving)  Potential benefits of a long term benzodiazepines  use as well as potential risks  and complications were explained to the patient and were aknowledged.  

## 2016-03-28 ENCOUNTER — Ambulatory Visit (INDEPENDENT_AMBULATORY_CARE_PROVIDER_SITE_OTHER): Payer: Medicare Other | Admitting: Internal Medicine

## 2016-03-28 ENCOUNTER — Encounter: Payer: Self-pay | Admitting: Internal Medicine

## 2016-03-28 DIAGNOSIS — E785 Hyperlipidemia, unspecified: Secondary | ICD-10-CM | POA: Diagnosis not present

## 2016-03-28 DIAGNOSIS — K2100 Gastro-esophageal reflux disease with esophagitis, without bleeding: Secondary | ICD-10-CM

## 2016-03-28 DIAGNOSIS — I10 Essential (primary) hypertension: Secondary | ICD-10-CM | POA: Diagnosis not present

## 2016-03-28 NOTE — Progress Notes (Signed)
Pre visit review using our clinic review tool, if applicable. No additional management support is needed unless otherwise documented below in the visit note. 

## 2016-03-28 NOTE — Assessment & Plan Note (Signed)
Ranitidine po

## 2016-03-28 NOTE — Assessment & Plan Note (Signed)
Statin intolerant 

## 2016-03-28 NOTE — Progress Notes (Signed)
Subjective:  Patient ID: [name], female    DOB: [birth year]  Age: 70 y.o. MRN: [id number]  CC: No chief complaint on file.   HPI [name] presents for HTN, GERD f/u  Outpatient Medications Prior to Visit  Medication Sig Dispense Refill  . aspirin 81 MG EC tablet Take 81 mg by mouth daily.      . Cholecalciferol 1000 UNITS tablet Take 1,000 Units by mouth daily.      . clorazepate (TRANXENE) 15 MG tablet Take 1 tablet (15 mg total) by mouth 3 (three) times daily as needed. for anxiety 270 tablet 0  . doxepin (SINEQUAN) 50 MG capsule Take 3 capsules (150 mg total) by mouth at bedtime. 270 capsule 3  . estradiol cypionate (DEPO-ESTRADIOL) 5 MG/ML injection Use 0.3 ml every 18-25 days IM 5 mL 3  . furosemide (LASIX) 40 MG tablet Take 1 tablet (40 mg total) by mouth 2 (two) times daily. 180 tablet 3  . hyoscyamine (LEVBID) 0.375 MG 12 hr tablet Take 1 tablet (0.375 mg total) by mouth 2 (two) times daily. 180 tablet 0  . levothyroxine (SYNTHROID, LEVOTHROID) 125 MCG tablet Take 1 tablet (125 mcg total) by mouth daily. 90 tablet 3  . LORazepam (ATIVAN) 0.5 MG tablet Take 1 tablet (0.5 mg total) by mouth at bedtime. 90 tablet 1  . losartan (COZAAR) 50 MG tablet Take 1 tablet (50 mg total) by mouth daily. 90 tablet 3  . metoprolol (LOPRESSOR) 50 MG tablet Take 1 tablet (50 mg total) by mouth 3 (three) times daily. 270 tablet 3  . NIFEdipine (PROCARDIA-XL/ADALAT-CC/NIFEDICAL-XL) 30 MG 24 hr tablet Take 1 tablet (30 mg total) by mouth daily. 90 tablet 3  . nitroGLYCERIN (NITROLINGUAL) 0.4 MG/SPRAY spray Place 1 spray under the tongue every 5 (five) minutes as needed for chest pain. For chest pain - may repeat x 3 16.9 g 3  . potassium chloride SA (KLOR-CON M20) 20 MEQ tablet Take 1 tablet (20 mEq total) by mouth 2 (two) times daily. 180 tablet 3  . ranitidine (ZANTAC) 300 MG tablet Take 1 tablet (300 mg total) by mouth at bedtime. 90 tablet 3   Facility-Administered Medications Prior to  Visit  Medication Dose Route Frequency Provider Last Rate Last Dose  . estradiol cypionate (DEPO-ESTRADIOL) 5 MG/ML injection 1 mg  1 mg Intramuscular Q30 days Tresa Garter, MD   1 mg at 01/31/15 0933    ROS Review of Systems  Constitutional: Negative for activity change, appetite change, chills, fatigue and unexpected weight change.  HENT: Negative for congestion, mouth sores and sinus pressure.   Eyes: Negative for visual disturbance.  Respiratory: Negative for cough and chest tightness.   Gastrointestinal: Negative for abdominal pain and nausea.  Genitourinary: Negative for difficulty urinating, frequency and vaginal pain.  Musculoskeletal: Negative for back pain and gait problem.  Skin: Negative for pallor and rash.  Neurological: Negative for dizziness, tremors, weakness, numbness and headaches.  Psychiatric/Behavioral: Negative for confusion and sleep disturbance.    Objective:  BP (!) 170/100   Pulse 80   Wt 167 lb (75.8 kg)   SpO2 98%   BMI 25.39 kg/m   BP Readings from Last 3 Encounters:  03/28/16 (!) 170/100  02/29/16 122/80  02/03/16 (!) 148/90    Wt Readings from Last 3 Encounters:  03/28/16 167 lb (75.8 kg)  02/29/16 165 lb (74.8 kg)  02/03/16 164 lb (74.4 kg)    Physical Exam  Constitutional: She appears well-developed.  No distress.  HENT:  Head: Normocephalic.  Right Ear: External ear normal.  Left Ear: External ear normal.  Nose: Nose normal.  Mouth/Throat: Oropharynx is clear and moist.  Eyes: Conjunctivae are normal. Pupils are equal, round, and reactive to light. Right eye exhibits no discharge. Left eye exhibits no discharge.  Neck: Normal range of motion. Neck supple. No JVD present. No tracheal deviation present. No thyromegaly present.  Cardiovascular: Normal rate, regular rhythm and normal heart sounds.   Pulmonary/Chest: No stridor. No respiratory distress. She has no wheezes.  Abdominal: Soft. Bowel sounds are normal. She exhibits no  distension and no mass. There is no tenderness. There is no rebound and no guarding.  Musculoskeletal: She exhibits no edema or tenderness.  Lymphadenopathy:    She has no cervical adenopathy.  Neurological: She displays normal reflexes. No cranial nerve deficit. She exhibits normal muscle tone. Coordination normal.  Skin: No rash noted. No erythema.  Psychiatric: She has a normal mood and affect. Her behavior is normal. Judgment and thought content normal.    Lab Results  Component Value Date   WBC 7.0 08/12/2013   HGB 12.9 08/12/2013   HCT 38.4 08/12/2013   PLT 286.0 08/12/2013   GLUCOSE 94 08/03/2014   CHOL 264 (H) 01/10/2012   TRIG 197.0 (H) 01/10/2012   HDL 54.70 01/10/2012   LDLDIRECT 188.2 01/10/2012   LDLCALC 76 03/21/2009   ALT 12 08/03/2014   AST 17 08/03/2014   NA 137 08/03/2014   K 4.1 08/03/2014   CL 103 08/03/2014   CREATININE 1.29 (H) 12/24/2014   BUN 18 12/24/2014   CO2 30 08/03/2014   TSH 3.30 08/03/2014   INR 1.09 08/22/2009    [place] Abdomen Complete  Result Date: 01/05/2015 CLINICAL DATA:  Abdominal pain EXAM: ULTRASOUND ABDOMEN COMPLETE COMPARISON:  CT abdomen and pelvis December 24, 2014 FINDINGS: Gallbladder: No gallstones or wall thickening visualized. There is no pericholecystic fluid. No sonographic Murphy sign noted. Common bile duct: Diameter: 4 mm. There is no intrahepatic, common hepatic, or common bile duct dilatation. Liver: No focal lesion identified. Within normal limits in parenchymal echogenicity. IVC: No abnormality visualized. Pancreas: No mass or inflammatory focus. Spleen: Size and appearance within normal limits. Right Kidney: Length: 9.7 cm. Echogenicity within normal limits. No mass or hydronephrosis visualized. Left Kidney: Length: 11.7 cm. Echogenicity within normal limits. No mass or hydronephrosis visualized. Abdominal aorta: No aneurysm visualized. Atherosclerotic change is noted in the aorta. Other findings: No demonstrable ascites.  IMPRESSION: Atherosclerotic change in the aorta without aneurysm. Study otherwise unremarkable. Electronically Signed   By: Bretta BangWilliam  Woodruff III M.D.   On: 01/05/2015 09:46    Assessment & Plan:   There are no diagnoses linked to this encounter. I am having [name] maintain her aspirin, Cholecalciferol, ranitidine, nitroGLYCERIN, hyoscyamine, estradiol cypionate, clorazepate, doxepin, furosemide, levothyroxine, LORazepam, losartan, metoprolol, NIFEdipine, and potassium chloride SA. We will continue to administer estradiol cypionate.  No orders of the defined types were placed in this encounter.    Follow-up: No Follow-up on file.  Sonda PrimesAlex Plotnikov, MD

## 2016-03-28 NOTE — Assessment & Plan Note (Signed)
Added Losartan

## 2016-04-27 ENCOUNTER — Ambulatory Visit: Payer: Medicare Other | Admitting: Internal Medicine

## 2016-05-03 ENCOUNTER — Encounter: Payer: Self-pay | Admitting: Internal Medicine

## 2016-05-03 ENCOUNTER — Ambulatory Visit (INDEPENDENT_AMBULATORY_CARE_PROVIDER_SITE_OTHER): Payer: Medicare Other | Admitting: Internal Medicine

## 2016-05-03 DIAGNOSIS — I10 Essential (primary) hypertension: Secondary | ICD-10-CM

## 2016-05-03 DIAGNOSIS — N951 Menopausal and female climacteric states: Secondary | ICD-10-CM | POA: Diagnosis not present

## 2016-05-03 NOTE — Progress Notes (Signed)
Subjective:  Patient ID: [name], female    DOB: 01/06/1946  Age: 70 y.o. MRN: [id number]  CC: No chief complaint on file.   HPI [name] presents for HTN, anxiety. BP is OK at home  Outpatient Medications Prior to Visit  Medication Sig Dispense Refill  . aspirin 81 MG EC tablet Take 81 mg by mouth daily.      . Cholecalciferol 1000 UNITS tablet Take 1,000 Units by mouth daily.      . clorazepate (TRANXENE) 15 MG tablet Take 1 tablet (15 mg total) by mouth 3 (three) times daily as needed. for anxiety 270 tablet 0  . doxepin (SINEQUAN) 50 MG capsule Take 3 capsules (150 mg total) by mouth at bedtime. 270 capsule 3  . estradiol cypionate (DEPO-ESTRADIOL) 5 MG/ML injection Use 0.3 ml every 18-25 days IM 5 mL 3  . furosemide (LASIX) 40 MG tablet Take 1 tablet (40 mg total) by mouth 2 (two) times daily. 180 tablet 3  . hyoscyamine (LEVBID) 0.375 MG 12 hr tablet Take 1 tablet (0.375 mg total) by mouth 2 (two) times daily. 180 tablet 0  . levothyroxine (SYNTHROID, LEVOTHROID) 125 MCG tablet Take 1 tablet (125 mcg total) by mouth daily. 90 tablet 3  . LORazepam (ATIVAN) 0.5 MG tablet Take 1 tablet (0.5 mg total) by mouth at bedtime. 90 tablet 1  . losartan (COZAAR) 50 MG tablet Take 1 tablet (50 mg total) by mouth daily. 90 tablet 3  . metoprolol (LOPRESSOR) 50 MG tablet Take 1 tablet (50 mg total) by mouth 3 (three) times daily. 270 tablet 3  . NIFEdipine (PROCARDIA-XL/ADALAT-CC/NIFEDICAL-XL) 30 MG 24 hr tablet Take 1 tablet (30 mg total) by mouth daily. 90 tablet 3  . nitroGLYCERIN (NITROLINGUAL) 0.4 MG/SPRAY spray Place 1 spray under the tongue every 5 (five) minutes as needed for chest pain. For chest pain - may repeat x 3 16.9 g 3  . potassium chloride SA (KLOR-CON M20) 20 MEQ tablet Take 1 tablet (20 mEq total) by mouth 2 (two) times daily. 180 tablet 3  . ranitidine (ZANTAC) 300 MG tablet Take 1 tablet (300 mg total) by mouth at bedtime. 90 tablet 3   Facility-Administered  Medications Prior to Visit  Medication Dose Route Frequency Provider Last Rate Last Dose  . estradiol cypionate (DEPO-ESTRADIOL) 5 MG/ML injection 1 mg  1 mg Intramuscular Q30 days Tresa GarterAleksei V Plotnikov, MD   1 mg at 01/31/15 0933    ROS Review of Systems  Constitutional: Negative for activity change, appetite change, chills, fatigue and unexpected weight change.  HENT: Negative for congestion, mouth sores and sinus pressure.   Eyes: Negative for visual disturbance.  Respiratory: Negative for cough and chest tightness.   Gastrointestinal: Negative for abdominal pain and nausea.  Genitourinary: Negative for difficulty urinating, frequency and vaginal pain.  Musculoskeletal: Negative for back pain and gait problem.  Skin: Negative for pallor and rash.  Neurological: Negative for dizziness, tremors, weakness, numbness and headaches.  Psychiatric/Behavioral: Negative for confusion and sleep disturbance.    Objective:  BP (!) 150/80   Pulse (!) 57   Wt 168 lb (76.2 kg)   SpO2 95%   BMI 25.54 kg/m   BP Readings from Last 3 Encounters:  05/03/16 (!) 150/80  03/28/16 (!) 145/84  02/29/16 122/80    Wt Readings from Last 3 Encounters:  05/03/16 168 lb (76.2 kg)  03/28/16 167 lb (75.8 kg)  02/29/16 165 lb (74.8 kg)    Physical Exam  Constitutional: She appears well-developed. No distress.  HENT:  Head: Normocephalic.  Right Ear: External ear normal.  Left Ear: External ear normal.  Nose: Nose normal.  Mouth/Throat: Oropharynx is clear and moist.  Eyes: Conjunctivae are normal. Pupils are equal, round, and reactive to light. Right eye exhibits no discharge. Left eye exhibits no discharge.  Neck: Normal range of motion. Neck supple. No JVD present. No tracheal deviation present. No thyromegaly present.  Cardiovascular: Normal rate, regular rhythm and normal heart sounds.   Pulmonary/Chest: No stridor. No respiratory distress. She has no wheezes.  Abdominal: Soft. Bowel sounds are  normal. She exhibits no distension and no mass. There is no tenderness. There is no rebound and no guarding.  Musculoskeletal: She exhibits no edema or tenderness.  Lymphadenopathy:    She has no cervical adenopathy.  Neurological: She displays normal reflexes. No cranial nerve deficit. She exhibits normal muscle tone. Coordination normal.  Skin: No rash noted. No erythema.  Psychiatric: She has a normal mood and affect. Her behavior is normal. Judgment and thought content normal.    Lab Results  Component Value Date   WBC 7.0 08/12/2013   HGB 12.9 08/12/2013   HCT 38.4 08/12/2013   PLT 286.0 08/12/2013   GLUCOSE 94 08/03/2014   CHOL 264 (H) 01/10/2012   TRIG 197.0 (H) 01/10/2012   HDL 54.70 01/10/2012   LDLDIRECT 188.2 01/10/2012   LDLCALC 76 03/21/2009   ALT 12 08/03/2014   AST 17 08/03/2014   NA 137 08/03/2014   K 4.1 08/03/2014   CL 103 08/03/2014   CREATININE 1.29 (H) 12/24/2014   BUN 18 12/24/2014   CO2 30 08/03/2014   TSH 3.30 08/03/2014   INR 1.09 08/22/2009    [place] Abdomen Complete  Result Date: 01/05/2015 CLINICAL DATA:  Abdominal pain EXAM: ULTRASOUND ABDOMEN COMPLETE COMPARISON:  CT abdomen and pelvis December 24, 2014 FINDINGS: Gallbladder: No gallstones or wall thickening visualized. There is no pericholecystic fluid. No sonographic Murphy sign noted. Common bile duct: Diameter: 4 mm. There is no intrahepatic, common hepatic, or common bile duct dilatation. Liver: No focal lesion identified. Within normal limits in parenchymal echogenicity. IVC: No abnormality visualized. Pancreas: No mass or inflammatory focus. Spleen: Size and appearance within normal limits. Right Kidney: Length: 9.7 cm. Echogenicity within normal limits. No mass or hydronephrosis visualized. Left Kidney: Length: 11.7 cm. Echogenicity within normal limits. No mass or hydronephrosis visualized. Abdominal aorta: No aneurysm visualized. Atherosclerotic change is noted in the aorta. Other findings: No  demonstrable ascites. IMPRESSION: Atherosclerotic change in the aorta without aneurysm. Study otherwise unremarkable. Electronically Signed   By: Bretta BangWilliam  Woodruff III M.D.   On: 01/05/2015 09:46    Assessment & Plan:   There are no diagnoses linked to this encounter. I am having Ms. [name] maintain her aspirin, Cholecalciferol, ranitidine, nitroGLYCERIN, hyoscyamine, estradiol cypionate, clorazepate, doxepin, furosemide, levothyroxine, LORazepam, losartan, metoprolol, NIFEdipine, and potassium chloride SA. We will continue to administer estradiol cypionate.  No orders of the defined types were placed in this encounter.    Follow-up: No Follow-up on file.  Sonda PrimesAlex Plotnikov, MD

## 2016-05-03 NOTE — Assessment & Plan Note (Signed)
Depo-Estard shot

## 2016-05-03 NOTE — Progress Notes (Signed)
Pre visit review using our clinic review tool, if applicable. No additional management support is needed unless otherwise documented below in the visit note. 

## 2016-05-03 NOTE — Assessment & Plan Note (Signed)
BP OK at home Losartan

## 2016-05-08 LAB — HM MAMMOGRAPHY

## 2016-05-10 ENCOUNTER — Encounter: Payer: Self-pay | Admitting: Internal Medicine

## 2016-06-01 ENCOUNTER — Ambulatory Visit (INDEPENDENT_AMBULATORY_CARE_PROVIDER_SITE_OTHER): Payer: Medicare Other | Admitting: Internal Medicine

## 2016-06-01 ENCOUNTER — Encounter: Payer: Self-pay | Admitting: Internal Medicine

## 2016-06-01 DIAGNOSIS — I10 Essential (primary) hypertension: Secondary | ICD-10-CM

## 2016-06-01 NOTE — Assessment & Plan Note (Signed)
Try Losartan 1/4 tab at bedtime 

## 2016-06-01 NOTE — Patient Instructions (Signed)
Try Losartan 1/4 tab at bedtime

## 2016-06-01 NOTE — Progress Notes (Signed)
Pre visit review using our clinic review tool, if applicable. No additional management support is needed unless otherwise documented below in the visit note. 

## 2016-06-01 NOTE — Progress Notes (Signed)
Subjective:  Patient ID: [name], female    DOB: 18-Mar-1946  Age: 71 y.o. MRN: [id number]  CC: No chief complaint on file.   HPI [name] presents for HTN. Pt started Losartan - got dizzy  Outpatient Medications Prior to Visit  Medication Sig Dispense Refill  . aspirin 81 MG EC tablet Take 81 mg by mouth daily.      . Cholecalciferol 1000 UNITS tablet Take 1,000 Units by mouth daily.      . clorazepate (TRANXENE) 15 MG tablet Take 1 tablet (15 mg total) by mouth 3 (three) times daily as needed. for anxiety 270 tablet 0  . doxepin (SINEQUAN) 50 MG capsule Take 3 capsules (150 mg total) by mouth at bedtime. 270 capsule 3  . estradiol cypionate (DEPO-ESTRADIOL) 5 MG/ML injection Use 0.3 ml every 18-25 days IM 5 mL 3  . furosemide (LASIX) 40 MG tablet Take 1 tablet (40 mg total) by mouth 2 (two) times daily. 180 tablet 3  . hyoscyamine (LEVBID) 0.375 MG 12 hr tablet Take 1 tablet (0.375 mg total) by mouth 2 (two) times daily. 180 tablet 0  . levothyroxine (SYNTHROID, LEVOTHROID) 125 MCG tablet Take 1 tablet (125 mcg total) by mouth daily. 90 tablet 3  . LORazepam (ATIVAN) 0.5 MG tablet Take 1 tablet (0.5 mg total) by mouth at bedtime. 90 tablet 1  . losartan (COZAAR) 50 MG tablet Take 1 tablet (50 mg total) by mouth daily. 90 tablet 3  . metoprolol (LOPRESSOR) 50 MG tablet Take 1 tablet (50 mg total) by mouth 3 (three) times daily. 270 tablet 3  . NIFEdipine (PROCARDIA-XL/ADALAT-CC/NIFEDICAL-XL) 30 MG 24 hr tablet Take 1 tablet (30 mg total) by mouth daily. 90 tablet 3  . nitroGLYCERIN (NITROLINGUAL) 0.4 MG/SPRAY spray Place 1 spray under the tongue every 5 (five) minutes as needed for chest pain. For chest pain - may repeat x 3 16.9 g 3  . potassium chloride SA (KLOR-CON M20) 20 MEQ tablet Take 1 tablet (20 mEq total) by mouth 2 (two) times daily. 180 tablet 3  . ranitidine (ZANTAC) 300 MG tablet Take 1 tablet (300 mg total) by mouth at bedtime. 90 tablet 3   Facility-Administered  Medications Prior to Visit  Medication Dose Route Frequency Provider Last Rate Last Dose  . estradiol cypionate (DEPO-ESTRADIOL) 5 MG/ML injection 1 mg  1 mg Intramuscular Q30 days Tresa Garter, MD   1 mg at 01/31/15 0933    ROS Review of Systems  Constitutional: Negative for activity change, appetite change, chills, fatigue and unexpected weight change.  HENT: Negative for congestion, mouth sores and sinus pressure.   Eyes: Negative for visual disturbance.  Respiratory: Negative for cough and chest tightness.   Gastrointestinal: Negative for abdominal pain and nausea.  Genitourinary: Negative for difficulty urinating, frequency and vaginal pain.  Musculoskeletal: Positive for arthralgias. Negative for back pain and gait problem.  Skin: Negative for pallor and rash.  Neurological: Negative for dizziness, tremors, weakness, numbness and headaches.  Psychiatric/Behavioral: Positive for sleep disturbance. Negative for confusion. The patient is nervous/anxious.     Objective:  BP (!) 160/80   Pulse 64   Wt 169 lb (76.7 kg)   SpO2 98%   BMI 25.70 kg/m   BP Readings from Last 3 Encounters:  06/01/16 (!) 160/80  05/03/16 (!) 146/84  03/28/16 (!) 145/84    Wt Readings from Last 3 Encounters:  06/01/16 169 lb (76.7 kg)  05/03/16 168 lb (76.2 kg)  03/28/16  167 lb (75.8 kg)    Physical Exam  Constitutional: She appears well-developed. No distress.  HENT:  Head: Normocephalic.  Right Ear: External ear normal.  Left Ear: External ear normal.  Nose: Nose normal.  Mouth/Throat: Oropharynx is clear and moist.  Eyes: Conjunctivae are normal. Pupils are equal, round, and reactive to light. Right eye exhibits no discharge. Left eye exhibits no discharge.  Neck: Normal range of motion. Neck supple. No JVD present. No tracheal deviation present. No thyromegaly present.  Cardiovascular: Normal rate, regular rhythm and normal heart sounds.   Pulmonary/Chest: No stridor. No  respiratory distress. She has no wheezes.  Abdominal: Soft. Bowel sounds are normal. She exhibits no distension and no mass. There is no tenderness. There is no rebound and no guarding.  Musculoskeletal: She exhibits no edema or tenderness.  Lymphadenopathy:    She has no cervical adenopathy.  Neurological: She displays normal reflexes. No cranial nerve deficit. She exhibits normal muscle tone. Coordination normal.  Skin: No rash noted. No erythema.  Psychiatric: She has a normal mood and affect. Her behavior is normal. Judgment and thought content normal.    Lab Results  Component Value Date   WBC 7.0 08/12/2013   HGB 12.9 08/12/2013   HCT 38.4 08/12/2013   PLT 286.0 08/12/2013   GLUCOSE 94 08/03/2014   CHOL 264 (H) 01/10/2012   TRIG 197.0 (H) 01/10/2012   HDL 54.70 01/10/2012   LDLDIRECT 188.2 01/10/2012   LDLCALC 76 03/21/2009   ALT 12 08/03/2014   AST 17 08/03/2014   NA 137 08/03/2014   K 4.1 08/03/2014   CL 103 08/03/2014   CREATININE 1.29 (H) 12/24/2014   BUN 18 12/24/2014   CO2 30 08/03/2014   TSH 3.30 08/03/2014   INR 1.09 08/22/2009    [place] Abdomen Complete  Result Date: 01/05/2015 CLINICAL DATA:  Abdominal pain EXAM: ULTRASOUND ABDOMEN COMPLETE COMPARISON:  CT abdomen and pelvis December 24, 2014 FINDINGS: Gallbladder: No gallstones or wall thickening visualized. There is no pericholecystic fluid. No sonographic Murphy sign noted. Common bile duct: Diameter: 4 mm. There is no intrahepatic, common hepatic, or common bile duct dilatation. Liver: No focal lesion identified. Within normal limits in parenchymal echogenicity. IVC: No abnormality visualized. Pancreas: No mass or inflammatory focus. Spleen: Size and appearance within normal limits. Right Kidney: Length: 9.7 cm. Echogenicity within normal limits. No mass or hydronephrosis visualized. Left Kidney: Length: 11.7 cm. Echogenicity within normal limits. No mass or hydronephrosis visualized. Abdominal aorta: No aneurysm  visualized. Atherosclerotic change is noted in the aorta. Other findings: No demonstrable ascites. IMPRESSION: Atherosclerotic change in the aorta without aneurysm. Study otherwise unremarkable. Electronically Signed   By: Bretta BangWilliam  Woodruff III M.D.   On: 01/05/2015 09:46    Assessment & Plan:   There are no diagnoses linked to this encounter. I am having Ms. [name] maintain her aspirin, Cholecalciferol, ranitidine, nitroGLYCERIN, hyoscyamine, estradiol cypionate, clorazepate, doxepin, furosemide, levothyroxine, LORazepam, losartan, metoprolol, NIFEdipine, and potassium chloride SA. We will continue to administer estradiol cypionate.  No orders of the defined types were placed in this encounter.    Follow-up: No Follow-up on file.  Sonda PrimesAlex Plotnikov, MD

## 2016-06-04 ENCOUNTER — Encounter: Payer: Self-pay | Admitting: *Deleted

## 2016-06-29 ENCOUNTER — Encounter: Payer: Self-pay | Admitting: Internal Medicine

## 2016-06-29 ENCOUNTER — Ambulatory Visit (INDEPENDENT_AMBULATORY_CARE_PROVIDER_SITE_OTHER): Payer: Medicare Other | Admitting: Internal Medicine

## 2016-06-29 DIAGNOSIS — I10 Essential (primary) hypertension: Secondary | ICD-10-CM | POA: Diagnosis not present

## 2016-06-29 DIAGNOSIS — F419 Anxiety disorder, unspecified: Secondary | ICD-10-CM

## 2016-06-29 MED ORDER — CLORAZEPATE DIPOTASSIUM 15 MG PO TABS: 15.0000 mg | ORAL_TABLET | Freq: Three times a day (TID) | ORAL | 0 refills | Status: DC | PRN

## 2016-06-29 MED ORDER — LORAZEPAM 0.5 MG PO TABS: 0.5000 mg | ORAL_TABLET | Freq: Every day | ORAL | 1 refills | Status: DC

## 2016-06-29 NOTE — Progress Notes (Signed)
Subjective:  Patient ID: [name], female    DOB: 12/24/1945  Age: 71 y.o. MRN: [id number]  CC: No chief complaint on file.   HPI [name] StallRuby C [name] presents for HTN, anxiety f/u  Outpatient Medications Prior to Visit  Medication Sig Dispense Refill  . aspirin 81 MG EC tablet Take 81 mg by mouth daily.      . Cholecalciferol 1000 UNITS tablet Take 1,000 Units by mouth daily.      Marland Kitchen. doxepin (SINEQUAN) 50 MG capsule Take 3 capsules (150 mg total) by mouth at bedtime. 270 capsule 3  . estradiol cypionate (DEPO-ESTRADIOL) 5 MG/ML injection Use 0.3 ml every 18-25 days IM 5 mL 3  . furosemide (LASIX) 40 MG tablet Take 1 tablet (40 mg total) by mouth 2 (two) times daily. 180 tablet 3  . hyoscyamine (LEVBID) 0.375 MG 12 hr tablet Take 1 tablet (0.375 mg total) by mouth 2 (two) times daily. 180 tablet 0  . levothyroxine (SYNTHROID, LEVOTHROID) 125 MCG tablet Take 1 tablet (125 mcg total) by mouth daily. 90 tablet 3  . losartan (COZAAR) 50 MG tablet Take 1 tablet (50 mg total) by mouth daily. 90 tablet 3  . metoprolol (LOPRESSOR) 50 MG tablet Take 1 tablet (50 mg total) by mouth 3 (three) times daily. 270 tablet 3  . NIFEdipine (PROCARDIA-XL/ADALAT-CC/NIFEDICAL-XL) 30 MG 24 hr tablet Take 1 tablet (30 mg total) by mouth daily. 90 tablet 3  . nitroGLYCERIN (NITROLINGUAL) 0.4 MG/SPRAY spray Place 1 spray under the tongue every 5 (five) minutes as needed for chest pain. For chest pain - may repeat x 3 16.9 g 3  . potassium chloride SA (KLOR-CON M20) 20 MEQ tablet Take 1 tablet (20 mEq total) by mouth 2 (two) times daily. 180 tablet 3  . ranitidine (ZANTAC) 300 MG tablet Take 1 tablet (300 mg total) by mouth at bedtime. 90 tablet 3  . clorazepate (TRANXENE) 15 MG tablet Take 1 tablet (15 mg total) by mouth 3 (three) times daily as needed. for anxiety 270 tablet 0  . LORazepam (ATIVAN) 0.5 MG tablet Take 1 tablet (0.5 mg total) by mouth at bedtime. 90 tablet 1   Facility-Administered Medications Prior to  Visit  Medication Dose Route Frequency Provider Last Rate Last Dose  . estradiol cypionate (DEPO-ESTRADIOL) 5 MG/ML injection 1 mg  1 mg Intramuscular Q30 days Jacinta ShoeAleksei Plotnikov V, MD   1 mg at 01/31/15 0933    ROS Review of Systems  Constitutional: Negative for activity change, appetite change, chills, fatigue and unexpected weight change.  HENT: Negative for congestion, mouth sores and sinus pressure.   Eyes: Negative for visual disturbance.  Respiratory: Negative for cough and chest tightness.   Gastrointestinal: Negative for abdominal pain and nausea.  Genitourinary: Negative for difficulty urinating, frequency and vaginal pain.  Musculoskeletal: Positive for arthralgias and back pain. Negative for gait problem.  Skin: Negative for pallor and rash.  Neurological: Negative for dizziness, tremors, weakness, numbness and headaches.  Psychiatric/Behavioral: Negative for confusion and sleep disturbance. The patient is nervous/anxious.     Objective:  BP 140/80   Pulse 90   Temp 98.6 F (37 C) (Oral)   Resp 20   Wt 167 lb 4 oz (75.9 kg)   SpO2 96%   BMI 25.43 kg/m   BP Readings from Last 3 Encounters:  06/29/16 140/80  06/01/16 140/85  05/03/16 (!) 146/84    Wt Readings from Last 3 Encounters:  06/29/16 167 lb 4 oz (75.9 kg)  06/01/16 169 lb (76.7 kg)  05/03/16 168 lb (76.2 kg)    Physical Exam  Constitutional: She appears well-developed. No distress.  HENT:  Head: Normocephalic.  Right Ear: External ear normal.  Left Ear: External ear normal.  Nose: Nose normal.  Mouth/Throat: Oropharynx is clear and moist.  Eyes: Conjunctivae are normal. Pupils are equal, round, and reactive to light. Right eye exhibits no discharge. Left eye exhibits no discharge.  Neck: Normal range of motion. Neck supple. No JVD present. No tracheal deviation present. No thyromegaly present.  Cardiovascular: Normal rate, regular rhythm and normal heart sounds.   Pulmonary/Chest: No stridor. No  respiratory distress. She has no wheezes.  Abdominal: Soft. Bowel sounds are normal. She exhibits no distension and no mass. There is no tenderness. There is no rebound and no guarding.  Musculoskeletal: She exhibits no edema or tenderness.  Lymphadenopathy:    She has no cervical adenopathy.  Neurological: She displays normal reflexes. No cranial nerve deficit. She exhibits normal muscle tone. Coordination normal.  Skin: No rash noted. No erythema.  Psychiatric: She has a normal mood and affect. Her behavior is normal. Judgment and thought content normal.    Lab Results  Component Value Date   WBC 7.0 08/12/2013   HGB 12.9 08/12/2013   HCT 38.4 08/12/2013   PLT 286.0 08/12/2013   GLUCOSE 94 08/03/2014   CHOL 264 (H) 01/10/2012   TRIG 197.0 (H) 01/10/2012   HDL 54.70 01/10/2012   LDLDIRECT 188.2 01/10/2012   LDLCALC 76 03/21/2009   ALT 12 08/03/2014   AST 17 08/03/2014   NA 137 08/03/2014   K 4.1 08/03/2014   CL 103 08/03/2014   CREATININE 1.29 (H) 12/24/2014   BUN 18 12/24/2014   CO2 30 08/03/2014   TSH 3.30 08/03/2014   INR 1.09 08/22/2009    US Abdomen Complete  Result Date: 01/05/2015 CLINICAL DATA:  Abdominal pain EXAM: ULTRASOUND ABDOMEN COMPLETE COMPARISON:  CT abdomen and pelvis December 24, 2014 FINDINGS: Gallbladder: No gallstones or wall thickening visualized. There is no pericholecystic fluid. No sonographic Murphy sign noted. Common bile duct: Diameter: 4 mm. There is no intrahepatic, common hepatic, or common bile duct dilatation. Liver: No focal lesion identified. Within normal limits in parenchymal echogenicity. IVC: No abnormality visualized. Pancreas: No mass or inflammatory focus. Spleen: Size and appearance within normal limits. Right Kidney: Length: 9.7 cm. Echogenicity within normal limits. No mass or hydronephrosis visualized. Left Kidney: Length: 11.7 cm. Echogenicity within normal limits. No mass or hydronephrosis visualized. Abdominal aorta: No aneurysm  visualized. Atherosclerotic change is noted in the aorta. Other findings: No demonstrable ascites. IMPRESSION: Atherosclerotic change in the aorta without aneurysm. Study otherwise unremarkable. Electronically Signed   By: Bretta Bang III M.D.   On: 01/05/2015 09:46    Assessment & Plan:   Diagnoses and all orders for this visit:  Essential hypertension  Chronic anxiety  Other orders -     clorazepate (TRANXENE) 15 MG tablet; Take 1 tablet (15 mg total) by mouth 3 (three) times daily as needed. for anxiety -     LORazepam (ATIVAN) 0.5 MG tablet; Take 1 tablet (0.5 mg total) by mouth at bedtime.   I am having Ms. Bessler maintain her aspirin, Cholecalciferol, ranitidine, nitroGLYCERIN, hyoscyamine, estradiol cypionate, doxepin, furosemide, levothyroxine, losartan, metoprolol, NIFEdipine, potassium chloride SA, clorazepate, and LORazepam. We will continue to administer estradiol cypionate.  Meds ordered this encounter  Medications  . clorazepate (TRANXENE) 15 MG tablet    Sig: Take 1  tablet (15 mg total) by mouth 3 (three) times daily as needed. for anxiety    Dispense:  270 tablet    Refill:  0  . LORazepam (ATIVAN) 0.5 MG tablet    Sig: Take 1 tablet (0.5 mg total) by mouth at bedtime.    Dispense:  90 tablet    Refill:  1     Follow-up: Return for a follow-up visit.  Sonda Primes, MD

## 2016-06-29 NOTE — Assessment & Plan Note (Signed)
Tranxene at hs prn. Lorazepam prn  Potential benefits of a long term benzodiazepines  use as well as potential risks  and complications were explained to the patient and were aknowledged.

## 2016-06-29 NOTE — Progress Notes (Signed)
Pre visit review using our clinic review tool, if applicable. No additional management support is needed unless otherwise documented below in the visit note. 

## 2016-06-29 NOTE — Assessment & Plan Note (Signed)
BP Readings from Last 3 Encounters:  06/29/16 140/80  06/01/16 140/85  05/03/16 (!) 146/84

## 2016-07-02 ENCOUNTER — Telehealth: Payer: Self-pay | Admitting: Cardiovascular Disease

## 2016-07-02 DIAGNOSIS — G458 Other transient cerebral ischemic attacks and related syndromes: Secondary | ICD-10-CM

## 2016-07-02 DIAGNOSIS — I6523 Occlusion and stenosis of bilateral carotid arteries: Secondary | ICD-10-CM

## 2016-07-02 NOTE — Telephone Encounter (Signed)
I spoke with the pt and she complains of pain mostly on the left side of her neck that has been ongoing for several months.  The pt said when she moves her neck the symptoms worsen. I made the pt aware that this is most likely not related to her carotid arteries.  The pt is due for a repeat Carotid duplex in March so I have placed an order and a scheduler will contact the pt to arrange test. Pt agreed with plan.

## 2016-07-02 NOTE — Telephone Encounter (Signed)
Patient is calling for advice or an opinion. She states that her neck has been sore (muscle soreness) and stiff for the past couple of months. She mentioned that her PCP offered to give her a Cortizone shot in her neck. Patient has some concerns if it is at all connected to her carotid artery. She would like to know if she should get the shot or not. Please call, thanks.

## 2016-08-02 ENCOUNTER — Ambulatory Visit (HOSPITAL_COMMUNITY)
Admission: RE | Admit: 2016-08-02 | Discharge: 2016-08-02 | Disposition: A | Discharge status: Home / Self Care | Payer: Medicare Other | Source: Ambulatory Visit | Attending: Cardiology | Admitting: Cardiology

## 2016-08-02 ENCOUNTER — Encounter (HOSPITAL_COMMUNITY): Payer: Medicare Other

## 2016-08-02 DIAGNOSIS — I6523 Occlusion and stenosis of bilateral carotid arteries: Secondary | ICD-10-CM | POA: Diagnosis not present

## 2016-08-02 DIAGNOSIS — E785 Hyperlipidemia, unspecified: Secondary | ICD-10-CM | POA: Insufficient documentation

## 2016-08-02 DIAGNOSIS — Z87891 Personal history of nicotine dependence: Secondary | ICD-10-CM | POA: Diagnosis not present

## 2016-08-02 DIAGNOSIS — G458 Other transient cerebral ischemic attacks and related syndromes: Secondary | ICD-10-CM

## 2016-08-02 DIAGNOSIS — I10 Essential (primary) hypertension: Secondary | ICD-10-CM | POA: Diagnosis not present

## 2016-08-02 DIAGNOSIS — I2510 Atherosclerotic heart disease of native coronary artery without angina pectoris: Secondary | ICD-10-CM | POA: Insufficient documentation

## 2016-08-10 ENCOUNTER — Ambulatory Visit (INDEPENDENT_AMBULATORY_CARE_PROVIDER_SITE_OTHER): Payer: Medicare Other | Admitting: Internal Medicine

## 2016-08-10 ENCOUNTER — Encounter: Payer: Self-pay | Admitting: Internal Medicine

## 2016-08-10 VITALS — BP 152/98 | HR 71 | Temp 98.0°F | Resp 16 | Ht 68.0 in | Wt 165.2 lb

## 2016-08-10 DIAGNOSIS — F329 Major depressive disorder, single episode, unspecified: Secondary | ICD-10-CM | POA: Diagnosis not present

## 2016-08-10 DIAGNOSIS — Z780 Asymptomatic menopausal state: Secondary | ICD-10-CM

## 2016-08-10 DIAGNOSIS — N951 Menopausal and female climacteric states: Secondary | ICD-10-CM

## 2016-08-10 DIAGNOSIS — E034 Atrophy of thyroid (acquired): Secondary | ICD-10-CM | POA: Diagnosis not present

## 2016-08-10 DIAGNOSIS — I10 Essential (primary) hypertension: Secondary | ICD-10-CM

## 2016-08-10 MED ORDER — ESTRADIOL CYPIONATE 5 MG/ML IM OIL
5.0000 mg | TOPICAL_OIL | Freq: Once | INTRAMUSCULAR | Status: AC
  Administered 2016-08-10: 5 mg via INTRAMUSCULAR

## 2016-08-10 NOTE — Progress Notes (Signed)
Subjective:  Patient ID: [name], female    DOB: 07/15/1945  Age: 71 y.o. MRN: [id number]  CC: Hypertension and Menopause   HPI [name] presents for HTN, hypothyroidism Grieving her dog's death  Outpatient Medications Prior to Visit  Medication Sig Dispense Refill  . aspirin 81 MG EC tablet Take 81 mg by mouth daily.      . Cholecalciferol 1000 UNITS tablet Take 1,000 Units by mouth daily.      . clorazepate (TRANXENE) 15 MG tablet Take 1 tablet (15 mg total) by mouth 3 (three) times daily as needed. for anxiety 270 tablet 0  . doxepin (SINEQUAN) 50 MG capsule Take 3 capsules (150 mg total) by mouth at bedtime. 270 capsule 3  . estradiol cypionate (DEPO-ESTRADIOL) 5 MG/ML injection Use 0.3 ml every 18-25 days IM 5 mL 3  . furosemide (LASIX) 40 MG tablet Take 1 tablet (40 mg total) by mouth 2 (two) times daily. 180 tablet 3  . levothyroxine (SYNTHROID, LEVOTHROID) 125 MCG tablet Take 1 tablet (125 mcg total) by mouth daily. 90 tablet 3  . LORazepam (ATIVAN) 0.5 MG tablet Take 1 tablet (0.5 mg total) by mouth at bedtime. 90 tablet 1  . metoprolol (LOPRESSOR) 50 MG tablet Take 1 tablet (50 mg total) by mouth 3 (three) times daily. 270 tablet 3  . NIFEdipine (PROCARDIA-XL/ADALAT-CC/NIFEDICAL-XL) 30 MG 24 hr tablet Take 1 tablet (30 mg total) by mouth daily. 90 tablet 3  . nitroGLYCERIN (NITROLINGUAL) 0.4 MG/SPRAY spray Place 1 spray under the tongue every 5 (five) minutes as needed for chest pain. For chest pain - may repeat x 3 16.9 g 3  . potassium chloride SA (KLOR-CON M20) 20 MEQ tablet Take 1 tablet (20 mEq total) by mouth 2 (two) times daily. 180 tablet 3  . hyoscyamine (LEVBID) 0.375 MG 12 hr tablet Take 1 tablet (0.375 mg total) by mouth 2 (two) times daily. 180 tablet 0  . losartan (COZAAR) 50 MG tablet Take 1 tablet (50 mg total) by mouth daily. 90 tablet 3  . ranitidine (ZANTAC) 300 MG tablet Take 1 tablet (300 mg total) by mouth at bedtime. 90 tablet 3    Facility-Administered Medications Prior to Visit  Medication Dose Route Frequency Provider Last Rate Last Dose  . estradiol cypionate (DEPO-ESTRADIOL) 5 MG/ML injection 1 mg  1 mg Intramuscular Q30 days Jacinta ShoeAleksei Mutasim Tuckey V, MD   1 mg at 01/31/15 0933    ROS Review of Systems  Constitutional: Negative for activity change, appetite change, chills, fatigue and unexpected weight change.  HENT: Negative for congestion, mouth sores and sinus pressure.   Eyes: Negative for visual disturbance.  Respiratory: Negative for cough and chest tightness.   Gastrointestinal: Negative for abdominal pain and nausea.  Genitourinary: Negative for difficulty urinating, frequency and vaginal pain.  Musculoskeletal: Negative for back pain and gait problem.  Skin: Negative for pallor and rash.  Neurological: Negative for dizziness, tremors, weakness, numbness and headaches.  Psychiatric/Behavioral: Negative for confusion and sleep disturbance. The patient is nervous/anxious.     Objective:  BP (!) 152/98   Pulse 71   Temp 98 F (36.7 C) (Oral)   Resp 16   Ht 5\' 8"  (1.727 m)   Wt 165 lb 4 oz (75 kg)   SpO2 96%   BMI 25.13 kg/m   BP Readings from Last 3 Encounters:  08/10/16 (!) 152/98  06/29/16 140/80  06/01/16 140/85    Wt Readings from Last 3 Encounters:  08/10/16  165 lb 4 oz (75 kg)  06/29/16 167 lb 4 oz (75.9 kg)  06/01/16 169 lb (76.7 kg)    Physical Exam  Constitutional: She appears well-developed. No distress.  HENT:  Head: Normocephalic.  Right Ear: External ear normal.  Left Ear: External ear normal.  Nose: Nose normal.  Mouth/Throat: Oropharynx is clear and moist.  Eyes: Conjunctivae are normal. Pupils are equal, round, and reactive to light. Right eye exhibits no discharge. Left eye exhibits no discharge.  Neck: Normal range of motion. Neck supple. No JVD present. No tracheal deviation present. No thyromegaly present.  Cardiovascular: Normal rate, regular rhythm and normal  heart sounds.   Pulmonary/Chest: No stridor. No respiratory distress. She has no wheezes.  Abdominal: Soft. Bowel sounds are normal. She exhibits no distension and no mass. There is no tenderness. There is no rebound and no guarding.  Musculoskeletal: She exhibits no edema or tenderness.  Lymphadenopathy:    She has no cervical adenopathy.  Neurological: She displays normal reflexes. No cranial nerve deficit. She exhibits normal muscle tone. Coordination normal.  Skin: No rash noted. No erythema.  Psychiatric: Her behavior is normal. Judgment and thought content normal.  tearful   Lab Results  Component Value Date   WBC 7.0 08/12/2013   HGB 12.9 08/12/2013   HCT 38.4 08/12/2013   PLT 286.0 08/12/2013   GLUCOSE 94 08/03/2014   CHOL 264 (H) 01/10/2012   TRIG 197.0 (H) 01/10/2012   HDL 54.70 01/10/2012   LDLDIRECT 188.2 01/10/2012   LDLCALC 76 03/21/2009   ALT 12 08/03/2014   AST 17 08/03/2014   NA 137 08/03/2014   K 4.1 08/03/2014   CL 103 08/03/2014   CREATININE 1.29 (H) 12/24/2014   BUN 18 12/24/2014   CO2 30 08/03/2014   TSH 3.30 08/03/2014   INR 1.09 08/22/2009    No results found.  Assessment & Plan:   [name] was seen today for hypertension and menopause.  Diagnoses and all orders for this visit:  Menopause -     estradiol cypionate (DEPO-ESTRADIOL) 5 MG/ML injection 5 mg; Inject 1 mL (5 mg total) into the muscle once.   I have discontinued [name]'s ranitidine, hyoscyamine, and losartan. I am also having her maintain her aspirin, Cholecalciferol, nitroGLYCERIN, estradiol cypionate, doxepin, furosemide, levothyroxine, metoprolol, NIFEdipine, potassium chloride SA, clorazepate, and LORazepam. We will continue to administer estradiol cypionate and estradiol cypionate.  Meds ordered this encounter  Medications  . estradiol cypionate (DEPO-ESTRADIOL) 5 MG/ML injection 5 mg     Follow-up: No Follow-up on file.  Sonda Primes, MD

## 2016-08-10 NOTE — Assessment & Plan Note (Addendum)
Grief, coping discussed FTF>20 min

## 2016-08-10 NOTE — Assessment & Plan Note (Signed)
Levothroid 

## 2016-08-10 NOTE — Assessment & Plan Note (Signed)
BP Readings from Last 3 Encounters:  08/10/16 (!) 152/98  06/29/16 140/80  06/01/16 140/85  Losartan

## 2016-08-10 NOTE — Assessment & Plan Note (Signed)
Depo-Estra given IM

## 2016-08-10 NOTE — Progress Notes (Signed)
Pre-visit discussion using our clinic review tool. No additional management support is needed unless otherwise documented below in the visit note.     Per Dr. Posey ReaPlotnikov 0.3 mL injection into patient

## 2016-08-16 ENCOUNTER — Encounter: Payer: Self-pay | Admitting: Cardiovascular Disease

## 2016-08-31 ENCOUNTER — Ambulatory Visit: Payer: Medicare Other | Admitting: Cardiovascular Disease

## 2016-09-10 ENCOUNTER — Other Ambulatory Visit: Payer: Self-pay | Admitting: Internal Medicine

## 2016-09-11 ENCOUNTER — Ambulatory Visit (INDEPENDENT_AMBULATORY_CARE_PROVIDER_SITE_OTHER): Payer: Medicare Other | Admitting: Internal Medicine

## 2016-09-11 ENCOUNTER — Encounter: Payer: Self-pay | Admitting: Internal Medicine

## 2016-09-11 DIAGNOSIS — F329 Major depressive disorder, single episode, unspecified: Secondary | ICD-10-CM | POA: Diagnosis not present

## 2016-09-11 DIAGNOSIS — E034 Atrophy of thyroid (acquired): Secondary | ICD-10-CM

## 2016-09-11 DIAGNOSIS — I10 Essential (primary) hypertension: Secondary | ICD-10-CM | POA: Diagnosis not present

## 2016-09-11 DIAGNOSIS — I2510 Atherosclerotic heart disease of native coronary artery without angina pectoris: Secondary | ICD-10-CM

## 2016-09-11 MED ORDER — POTASSIUM CHLORIDE CRYS ER 20 MEQ PO TBCR: 20.0000 meq | EXTENDED_RELEASE_TABLET | Freq: Two times a day (BID) | ORAL | 3 refills | Status: DC

## 2016-09-11 MED ORDER — LORAZEPAM 0.5 MG PO TABS: 0.5000 mg | ORAL_TABLET | Freq: Every day | ORAL | 1 refills | Status: DC

## 2016-09-11 MED ORDER — NIFEDIPINE ER OSMOTIC RELEASE 30 MG PO TB24: 30.0000 mg | ORAL_TABLET | Freq: Every day | ORAL | 3 refills | Status: DC

## 2016-09-11 MED ORDER — CLORAZEPATE DIPOTASSIUM 15 MG PO TABS: 15.0000 mg | ORAL_TABLET | Freq: Three times a day (TID) | ORAL | 0 refills | Status: DC | PRN

## 2016-09-11 MED ORDER — DOXEPIN HCL 50 MG PO CAPS: 150.0000 mg | ORAL_CAPSULE | Freq: Every day | ORAL | 3 refills | Status: DC

## 2016-09-11 MED ORDER — LEVOTHYROXINE SODIUM 125 MCG PO TABS: 125.0000 ug | ORAL_TABLET | Freq: Every day | ORAL | 3 refills | Status: DC

## 2016-09-11 MED ORDER — METOPROLOL TARTRATE 50 MG PO TABS: 50.0000 mg | ORAL_TABLET | Freq: Three times a day (TID) | ORAL | 3 refills | Status: DC

## 2016-09-11 MED ORDER — FUROSEMIDE 40 MG PO TABS: 40.0000 mg | ORAL_TABLET | Freq: Two times a day (BID) | ORAL | 3 refills | Status: DC

## 2016-09-11 NOTE — Patient Instructions (Signed)
MC well w/Jill 

## 2016-09-11 NOTE — Progress Notes (Signed)
Pre visit review using our clinic review tool, if applicable. No additional management support is needed unless otherwise documented below in the visit note. 

## 2016-09-11 NOTE — Assessment & Plan Note (Signed)
Cont w/Toprol, Procardia

## 2016-09-11 NOTE — Assessment & Plan Note (Signed)
Levothroid 

## 2016-09-11 NOTE — Progress Notes (Signed)
Subjective:  Patient ID: [name], female    DOB: Apr 08, 1946  Age: 71 y.o. MRN: [id number]  CC: No chief complaint on file.   HPI [name] presents for HTN, depression, hypothyroidism f/u  Outpatient Medications Prior to Visit  Medication Sig Dispense Refill  . aspirin 81 MG EC tablet Take 81 mg by mouth daily.      . Cholecalciferol 1000 UNITS tablet Take 1,000 Units by mouth daily.      . clorazepate (TRANXENE) 15 MG tablet Take 1 tablet (15 mg total) by mouth 3 (three) times daily as needed. for anxiety 270 tablet 0  . doxepin (SINEQUAN) 50 MG capsule Take 3 capsules (150 mg total) by mouth at bedtime. 270 capsule 3  . estradiol cypionate (DEPO-ESTRADIOL) 5 MG/ML injection Use 0.3 ml every 18-25 days IM 5 mL 3  . furosemide (LASIX) 40 MG tablet Take 1 tablet (40 mg total) by mouth 2 (two) times daily. 180 tablet 3  . levothyroxine (SYNTHROID, LEVOTHROID) 125 MCG tablet TAKE ONE TABLET BY MOUTH ONCE DAILY 90 tablet 3  . LORazepam (ATIVAN) 0.5 MG tablet Take 1 tablet (0.5 mg total) by mouth at bedtime. 90 tablet 1  . metoprolol (LOPRESSOR) 50 MG tablet Take 1 tablet (50 mg total) by mouth 3 (three) times daily. 270 tablet 3  . NIFEdipine (PROCARDIA-XL/ADALAT-CC/NIFEDICAL-XL) 30 MG 24 hr tablet Take 1 tablet (30 mg total) by mouth daily. 90 tablet 3  . nitroGLYCERIN (NITROLINGUAL) 0.4 MG/SPRAY spray Place 1 spray under the tongue every 5 (five) minutes as needed for chest pain. For chest pain - may repeat x 3 16.9 g 3  . potassium chloride SA (KLOR-CON M20) 20 MEQ tablet Take 1 tablet (20 mEq total) by mouth 2 (two) times daily. 180 tablet 3  . furosemide (LASIX) 40 MG tablet TAKE ONE TABLET BY MOUTH TWICE DAILY 180 tablet 3  . doxepin (SINEQUAN) 50 MG capsule TAKE THREE CAPSULES BY MOUTH AT BEDTIME 90 capsule 11  . levothyroxine (SYNTHROID, LEVOTHROID) 125 MCG tablet Take 1 tablet (125 mcg total) by mouth daily. 90 tablet 3  . metoprolol (LOPRESSOR) 50 MG tablet TAKE ONE TABLET BY  MOUTH THREE TIMES DAILY 270 tablet 3  . NIFEdipine (PROCARDIA-XL/ADALAT-CC/NIFEDICAL-XL) 30 MG 24 hr tablet TAKE ONE TABLET BY MOUTH ONCE DAILY 90 tablet 3   Facility-Administered Medications Prior to Visit  Medication Dose Route Frequency Provider Last Rate Last Dose  . estradiol cypionate (DEPO-ESTRADIOL) 5 MG/ML injection 1 mg  1 mg Intramuscular Q30 days Jacinta Shoe V, MD   1 mg at 01/31/15 0933    ROS Review of Systems  Constitutional: Negative for activity change, appetite change, chills, fatigue and unexpected weight change.  HENT: Negative for congestion, mouth sores and sinus pressure.   Eyes: Negative for visual disturbance.  Respiratory: Negative for cough and chest tightness.   Gastrointestinal: Negative for abdominal pain and nausea.  Genitourinary: Negative for difficulty urinating, frequency and vaginal pain.  Musculoskeletal: Negative for back pain and gait problem.  Skin: Negative for pallor and rash.  Neurological: Negative for dizziness, tremors, weakness, numbness and headaches.  Psychiatric/Behavioral: Negative for confusion and sleep disturbance. The patient is nervous/anxious.     Objective:  Ht  (1.727 m)   Wt 166 lb (75.3 kg)   BMI 25.24 kg/m   BP Readings from Last 3 Encounters:  08/10/16 (!) 152/98  06/29/16 140/80  06/01/16 140/85    Wt Readings from Last 3 Encounters:  09/11/16 166  lb (75.3 kg)  08/10/16 165 lb 4 oz (75 kg)  06/29/16 167 lb 4 oz (75.9 kg)    Physical Exam  Constitutional: She appears well-developed. No distress.  HENT:  Head: Normocephalic.  Right Ear: External ear normal.  Left Ear: External ear normal.  Nose: Nose normal.  Mouth/Throat: Oropharynx is clear and moist.  Eyes: Conjunctivae are normal. Pupils are equal, round, and reactive to light. Right eye exhibits no discharge. Left eye exhibits no discharge.  Neck: Normal range of motion. Neck supple. No JVD present. No tracheal deviation present. No  thyromegaly present.  Cardiovascular: Normal rate, regular rhythm and normal heart sounds.   Pulmonary/Chest: No stridor. No respiratory distress. She has no wheezes.  Abdominal: Soft. Bowel sounds are normal. She exhibits no distension and no mass. There is no tenderness. There is no rebound and no guarding.  Musculoskeletal: She exhibits no edema or tenderness.  Lymphadenopathy:    She has no cervical adenopathy.  Neurological: She displays normal reflexes. No cranial nerve deficit. She exhibits normal muscle tone. Coordination normal.  Skin: No rash noted. No erythema.  Psychiatric: She has a normal mood and affect. Her behavior is normal. Judgment and thought content normal.  in a better mood  Lab Results  Component Value Date   WBC 7.0 08/12/2013   HGB 12.9 08/12/2013   HCT 38.4 08/12/2013   PLT 286.0 08/12/2013   GLUCOSE 94 08/03/2014   CHOL 264 (H) 01/10/2012   TRIG 197.0 (H) 01/10/2012   HDL 54.70 01/10/2012   LDLDIRECT 188.2 01/10/2012   LDLCALC 76 03/21/2009   ALT 12 08/03/2014   AST 17 08/03/2014   NA 137 08/03/2014   K 4.1 08/03/2014   CL 103 08/03/2014   CREATININE 1.29 (H) 12/24/2014   BUN 18 12/24/2014   CO2 30 08/03/2014   TSH 3.30 08/03/2014   INR 1.09 08/22/2009    No results found.  Assessment & Plan:   There are no diagnoses linked to this encounter. I am having [name] maintain her aspirin, Cholecalciferol, nitroGLYCERIN, estradiol cypionate, doxepin, furosemide, metoprolol, NIFEdipine, potassium chloride SA, clorazepate, LORazepam, and levothyroxine. We will continue to administer estradiol cypionate.  No orders of the defined types were placed in this encounter.    Follow-up: No Follow-up on file.  Sonda Primes, MD

## 2016-09-11 NOTE — Assessment & Plan Note (Signed)
BP is OK at home 

## 2016-09-11 NOTE — Assessment & Plan Note (Addendum)
Doxepin  Better since she got a new dog Lynnea Ferrier

## 2016-09-17 ENCOUNTER — Ambulatory Visit (INDEPENDENT_AMBULATORY_CARE_PROVIDER_SITE_OTHER)
Admission: RE | Admit: 2016-09-17 | Discharge: 2016-09-17 | Disposition: A | Discharge status: Home / Self Care | Payer: Medicare Other | Source: Ambulatory Visit | Attending: Nurse Practitioner | Admitting: Nurse Practitioner

## 2016-09-17 ENCOUNTER — Encounter: Payer: Self-pay | Admitting: Nurse Practitioner

## 2016-09-17 ENCOUNTER — Other Ambulatory Visit: Payer: Self-pay | Admitting: Nurse Practitioner

## 2016-09-17 ENCOUNTER — Ambulatory Visit (INDEPENDENT_AMBULATORY_CARE_PROVIDER_SITE_OTHER): Payer: Medicare Other | Admitting: Nurse Practitioner

## 2016-09-17 VITALS — BP 146/80 | HR 76 | Temp 97.5°F | Ht 68.0 in | Wt 166.0 lb

## 2016-09-17 DIAGNOSIS — S9032XA Contusion of left foot, initial encounter: Secondary | ICD-10-CM | POA: Diagnosis not present

## 2016-09-17 DIAGNOSIS — M7989 Other specified soft tissue disorders: Secondary | ICD-10-CM | POA: Diagnosis not present

## 2016-09-17 DIAGNOSIS — M25475 Effusion, left foot: Secondary | ICD-10-CM

## 2016-09-17 DIAGNOSIS — S9031XA Contusion of right foot, initial encounter: Secondary | ICD-10-CM

## 2016-09-17 NOTE — Progress Notes (Addendum)
Subjective:  Patient ID: [name], female    DOB: [birth year]  Age: 71 y.o. MRN: [id number]  CC: Foot Swelling (right foot swelling,pain,black and blue for 1 wk. )   HPI Right Foot swelling and pain: Onset last week. Does not remember any injury. Associated bruising and pain with weight bearing and palpation. Denies any recent travel.  Outpatient Medications Prior to Visit  Medication Sig Dispense Refill  . aspirin 81 MG EC tablet Take 81 mg by mouth daily.      . Cholecalciferol 1000 UNITS tablet Take 1,000 Units by mouth daily.      . clorazepate (TRANXENE) 15 MG tablet Take 1 tablet (15 mg total) by mouth 3 (three) times daily as needed. for anxiety 270 tablet 0  . doxepin (SINEQUAN) 50 MG capsule Take 3 capsules (150 mg total) by mouth at bedtime. 270 capsule 3  . estradiol cypionate (DEPO-ESTRADIOL) 5 MG/ML injection Use 0.3 ml every 18-25 days IM 5 mL 3  . furosemide (LASIX) 40 MG tablet Take 1 tablet (40 mg total) by mouth 2 (two) times daily. 180 tablet 3  . levothyroxine (SYNTHROID, LEVOTHROID) 125 MCG tablet Take 1 tablet (125 mcg total) by mouth daily. 90 tablet 3  . LORazepam (ATIVAN) 0.5 MG tablet Take 1 tablet (0.5 mg total) by mouth at bedtime. 90 tablet 1  . metoprolol (LOPRESSOR) 50 MG tablet Take 1 tablet (50 mg total) by mouth 3 (three) times daily. 270 tablet 3  . NIFEdipine (PROCARDIA-XL/ADALAT-CC/NIFEDICAL-XL) 30 MG 24 hr tablet Take 1 tablet (30 mg total) by mouth daily. 90 tablet 3  . nitroGLYCERIN (NITROLINGUAL) 0.4 MG/SPRAY spray Place 1 spray under the tongue every 5 (five) minutes as needed for chest pain. For chest pain - may repeat x 3 16.9 g 3  . potassium chloride SA (KLOR-CON M20) 20 MEQ tablet Take 1 tablet (20 mEq total) by mouth 2 (two) times daily. 180 tablet 3   Facility-Administered Medications Prior to Visit  Medication Dose Route Frequency Provider Last Rate Last Dose  . estradiol cypionate (DEPO-ESTRADIOL) 5 MG/ML injection 1 mg  1 mg  Intramuscular Q30 days Plotnikov, Georgina Quint, MD   1 mg at 01/31/15 0933    ROS See HPI  Objective:  BP (!) 146/80   Pulse 76   Temp 97.5 F (36.4 C)   Ht  (1.727 m)   Wt 166 lb (75.3 kg)   SpO2 97%   BMI 25.24 kg/m   BP Readings from Last 3 Encounters:  09/17/16 (!) 146/80  09/11/16 (!) 148/84  08/10/16 (!) 152/98    Wt Readings from Last 3 Encounters:  09/17/16 166 lb (75.3 kg)  09/11/16 166 lb (75.3 kg)  08/10/16 165 lb 4 oz (75 kg)    Physical Exam  Constitutional: She is oriented to person, place, and time.  Cardiovascular: Normal rate.   Pulmonary/Chest: Effort normal.  Musculoskeletal: She exhibits edema and tenderness.       Right ankle: Normal.       Left ankle: Normal.       Right foot: There is tenderness, bony tenderness and swelling. There is no laceration.       Left foot: There is normal range of motion and no laceration.  Difficulty finding pedal pulses. Ecchymosis of 2nd-4th toes and lateral aspect of foot.  Neurological: She is alert and oriented to person, place, and time.  Skin: Skin is warm and dry. There is erythema.  Vitals reviewed.   Lab  Results  Component Value Date   WBC 7.0 08/12/2013   HGB 12.9 08/12/2013   HCT 38.4 08/12/2013   PLT 286.0 08/12/2013   GLUCOSE 94 08/03/2014   CHOL 264 (H) 01/10/2012   TRIG 197.0 (H) 01/10/2012   HDL 54.70 01/10/2012   LDLDIRECT 188.2 01/10/2012   LDLCALC 76 03/21/2009   ALT 12 08/03/2014   AST 17 08/03/2014   NA 137 08/03/2014   K 4.1 08/03/2014   CL 103 08/03/2014   CREATININE 1.29 (H) 12/24/2014   BUN 18 12/24/2014   CO2 30 08/03/2014   TSH 3.30 08/03/2014   INR 1.09 08/22/2009    Dg Foot Complete Right  Result Date: 09/17/2016 CLINICAL DATA:  Bruising.  No known injury. EXAM: RIGHT FOOT COMPLETE - 3+ VIEW COMPARISON:  No recent prior . FINDINGS: Diffuse degenerative change.  No acute or focal bony abnormality. IMPRESSION: No acute or focal abnormality.  Diffuse degenerative  change . Electronically Signed   By: Maisie Fus  Register   On: 09/17/2016 17:23    Assessment & Plan:   [name] was seen today for foot swelling.  Diagnoses and all orders for this visit:  Swelling of toe of right foot -     Cancel: DG Foot Complete Left; Future -     VAS [place] LOWER EXTREMITY VENOUS (DVT); Future  Contusion of right foot, initial encounter -     Cancel: DG Foot Complete Left; Future -     VAS [place] LOWER EXTREMITY VENOUS (DVT); Future   I am having Ms. Loss maintain her aspirin, Cholecalciferol, nitroGLYCERIN, estradiol cypionate, NIFEdipine, doxepin, metoprolol, furosemide, levothyroxine, clorazepate, LORazepam, and potassium chloride SA. We will continue to administer estradiol cypionate.  No orders of the defined types were placed in this encounter.   Follow-up: Return if symptoms worsen or fail to improve.  Alysia Penna, NP

## 2016-09-17 NOTE — Progress Notes (Signed)
Pre visit review using our clinic review tool, if applicable. No additional management support is needed unless otherwise documented below in the visit note. 

## 2016-09-17 NOTE — Patient Instructions (Addendum)
Normal foot x-ray. Venous doppler negative for DVT.  Elevate leg as much as possible Use tylenol prn for pain

## 2016-09-18 ENCOUNTER — Ambulatory Visit (HOSPITAL_COMMUNITY)
Admission: RE | Admit: 2016-09-18 | Discharge: 2016-09-18 | Disposition: A | Discharge status: Home / Self Care | Payer: Medicare Other | Source: Ambulatory Visit | Attending: Nurse Practitioner | Admitting: Nurse Practitioner

## 2016-09-18 DIAGNOSIS — M7989 Other specified soft tissue disorders: Secondary | ICD-10-CM | POA: Insufficient documentation

## 2016-09-18 DIAGNOSIS — S9031XA Contusion of right foot, initial encounter: Secondary | ICD-10-CM | POA: Insufficient documentation

## 2016-09-18 NOTE — Progress Notes (Signed)
VASCULAR LAB PRELIMINARY  PRELIMINARY  PRELIMINARY  PRELIMINARY  Right lower extremity venous duplex completed.    Preliminary report:  Right:  No evidence of DVT, superficial thrombosis, or Baker's cyst.  SLAUGHTER, [place], RVS 09/18/2016, 2:15 PM

## 2016-09-24 ENCOUNTER — Other Ambulatory Visit: Payer: Self-pay | Admitting: Internal Medicine

## 2016-09-27 ENCOUNTER — Ambulatory Visit (INDEPENDENT_AMBULATORY_CARE_PROVIDER_SITE_OTHER): Payer: Medicare Other | Admitting: Internal Medicine

## 2016-09-27 ENCOUNTER — Encounter: Payer: Self-pay | Admitting: Internal Medicine

## 2016-09-27 DIAGNOSIS — S9031XS Contusion of right foot, sequela: Secondary | ICD-10-CM | POA: Diagnosis not present

## 2016-09-27 MED ORDER — CEPHALEXIN 500 MG PO CAPS: 500.0000 mg | ORAL_CAPSULE | Freq: Four times a day (QID) | ORAL | 0 refills | Status: DC

## 2016-09-27 MED ORDER — MUPIROCIN 2 % EX OINT: TOPICAL_OINTMENT | CUTANEOUS | 0 refills | Status: DC

## 2016-09-27 NOTE — Progress Notes (Signed)
Subjective:  Patient ID: [name], female    DOB: Mar 22, 1946  Age: 71 y.o. MRN: [id number]  CC: No chief complaint on file.   HPI MILAYNA ROTENBERG presents for R foot swelling w/a very painful x2 weeks  Outpatient Medications Prior to Visit  Medication Sig Dispense Refill  . aspirin 81 MG EC tablet Take 81 mg by mouth daily.      . Cholecalciferol 1000 UNITS tablet Take 1,000 Units by mouth daily.      . clorazepate (TRANXENE) 15 MG tablet Take 1 tablet (15 mg total) by mouth 3 (three) times daily as needed. for anxiety 270 tablet 0  . doxepin (SINEQUAN) 50 MG capsule Take 3 capsules (150 mg total) by mouth at bedtime. 270 capsule 3  . estradiol cypionate (DEPO-ESTRADIOL) 5 MG/ML injection Use 0.3 ml every 18-25 days IM 5 mL 3  . furosemide (LASIX) 40 MG tablet Take 1 tablet (40 mg total) by mouth 2 (two) times daily. 180 tablet 3  . levothyroxine (SYNTHROID, LEVOTHROID) 125 MCG tablet Take 1 tablet (125 mcg total) by mouth daily. 90 tablet 3  . LORazepam (ATIVAN) 0.5 MG tablet Take 1 tablet (0.5 mg total) by mouth at bedtime. 90 tablet 1  . metoprolol (LOPRESSOR) 50 MG tablet Take 1 tablet (50 mg total) by mouth 3 (three) times daily. 270 tablet 3  . NIFEdipine (PROCARDIA-XL/ADALAT-CC/NIFEDICAL-XL) 30 MG 24 hr tablet Take 1 tablet (30 mg total) by mouth daily. 90 tablet 3  . nitroGLYCERIN (NITROLINGUAL) 0.4 MG/SPRAY spray PLACE 1 SPRAY UNDER THE TONGUE EVERY 5 MINUTES AS NEEDED FOR CHEST PAIN, MAY REPEAT 3 TIMES 12 g 0  . potassium chloride SA (KLOR-CON M20) 20 MEQ tablet Take 1 tablet (20 mEq total) by mouth 2 (two) times daily. 180 tablet 3   Facility-Administered Medications Prior to Visit  Medication Dose Route Frequency Provider Last Rate Last Dose  . estradiol cypionate (DEPO-ESTRADIOL) 5 MG/ML injection 1 mg  1 mg Intramuscular Q30 days Plotnikov, Georgina Quint, MD   1 mg at 01/31/15 0933    ROS Review of Systems  Constitutional: Negative for chills and fever.  Skin: Positive for  color change.    Objective:  BP (!) 158/84 (BP Location: Right Arm, Patient Position: Sitting, Cuff Size: Normal)   Pulse 72   Temp 97.6 F (36.4 C) (Oral)   Ht 5\' 8"  (1.727 m)   Wt 164 lb 1.3 oz (74.4 kg)   SpO2 97%   BMI 24.95 kg/m   BP Readings from Last 3 Encounters:  09/27/16 (!) 158/84  09/17/16 (!) 146/80  09/11/16 (!) 148/84    Wt Readings from Last 3 Encounters:  09/27/16 164 lb 1.3 oz (74.4 kg)  09/17/16 166 lb (75.3 kg)  09/11/16 166 lb (75.3 kg)    Physical Exam  Musculoskeletal: She exhibits edema and tenderness.  Skin: There is erythema.  R lat  foot w/lat bluish swelling and discolorationand a 2x1 cm very tender swelling and redness  Procedure Note :    Procedure :   Point of care (POC) sonography examination   Indication: ?abscess   Equipment used: Lumify transducer linear probe. The images were stored in the unit and later transferred in storage.  The patient was placed in a decubitus position.  This study revealed a hypoechoic SQ lesion in the R lat foot and w/peripheral swelling in the soft tissues   Impression: Abscess vs hematoma   Procedure note:  Incision and Drainage of an Abscess  Indication : a localized collection of pus or blood that is tender and not spontaneously resolving.    Risks including unsuccessful procedure , possible need for a repeat procedure due to pus accumulation, scar formation, and others as well as benefits were explained to the patient in detail. Written consent was obtained   The patient was placed in a decubitus position. The area of an abscess was prepped with povidone-iodine and draped in a sterile fashion. Local anesthesia with  1   cc of 2% lidocaine and epinephrine  was administered.  1 cm incision with #11strait blade was made. About 1.5 cc of clotted material was expressed. The abscess cavity was explored with a sterile hemostat and the walled- off pockets and septae were broken down bluntly. The  cavity was irrigated with the rest of the anesthetic in the syringe and packed with 3 inches of  the iodoform gauze.   The wound was dressed with antibiotic ointment and Telfa pad.  Tolerated well. Complications: None.   Wound instructions provided.   Wound instructions : change dressing once a day or twice a day is needed. Change dressing after  shower in the morning.  Pat dry the wound with gauze.a Band-Aid of appropriate size.   Please contact us if you notice a recollection of pus in the abscess fever and chills increased pain redness red streaks near the abscess increased swelling in the area    Lab Results  Component Value Date   WBC 7.0 08/12/2013   HGB 12.9 08/12/2013   HCT 38.4 08/12/2013   PLT 286.0 08/12/2013   GLUCOSE 94 08/03/2014   CHOL 264 (H) 01/10/2012   TRIG 197.0 (H) 01/10/2012   HDL 54.70 01/10/2012   LDLDIRECT 188.2 01/10/2012   LDLCALC 76 03/21/2009   ALT 12 08/03/2014   AST 17 08/03/2014   NA 137 08/03/2014   K 4.1 08/03/2014   CL 103 08/03/2014   CREATININE 1.29 (H) 12/24/2014   BUN 18 12/24/2014   CO2 30 08/03/2014   TSH 3.30 08/03/2014   INR 1.09 08/22/2009    No results found.  Assessment & Plan:   There are no diagnoses linked to this encounter. I am having Ms. Dorothyann GibbsFinch start on cephALEXin and mupirocin ointment. I am also having her maintain her aspirin, Cholecalciferol, estradiol cypionate, NIFEdipine, doxepin, metoprolol, furosemide, levothyroxine, clorazepate, LORazepam, potassium chloride SA, and nitroGLYCERIN. We will continue to administer estradiol cypionate.  Meds ordered this encounter  Medications  . cephALEXin (KEFLEX) 500 MG capsule    Sig: Take 1 capsule (500 mg total) by mouth 4 (four) times daily.    Dispense:  20 capsule    Refill:  0  . mupirocin ointment (BACTROBAN) 2 %    Sig: Use w/dressing change qd-bid    Dispense:  15 g    Refill:  0     Follow-up: No Follow-up on file.  Sonda PrimesAlex Plotnikov, MD

## 2016-09-27 NOTE — Patient Instructions (Signed)
Wash with liquid soap and water, pat dry and dress w/a bandaid and antibiotic ointment Keep your foot elevated

## 2016-09-27 NOTE — Assessment & Plan Note (Signed)
See procedure for differential dx and Rx POC US Keflex Bactroban

## 2016-09-28 ENCOUNTER — Telehealth: Payer: Self-pay | Admitting: Internal Medicine

## 2016-09-28 NOTE — Telephone Encounter (Signed)
Pt called in said that she had some mild reactions from the cephALEXin (KEFLEX) 500 MG capsule [[id number]][[id number]]  She needs a nurse to call her as soon as possible

## 2016-09-28 NOTE — Telephone Encounter (Signed)
Ok to stop cephalexin Thanks

## 2016-09-28 NOTE — Telephone Encounter (Signed)
Number unavailable

## 2016-09-28 NOTE — Telephone Encounter (Signed)
Patient is experiencing dizziness, heavy head feeling, tight feeling in chest(indigestion feeling), and upset stomach around 1-2 hours after taking medication and something small to eat. Patient did take a gas-x and ate some animal crackers a little later and she started feeling a little better. She would like your opinion on what she should do about the medication. Please advise.

## 2016-10-01 NOTE — Telephone Encounter (Signed)
Pt notified and states she will just finish out the medication.

## 2016-10-16 ENCOUNTER — Ambulatory Visit (INDEPENDENT_AMBULATORY_CARE_PROVIDER_SITE_OTHER): Payer: Medicare Other | Admitting: Internal Medicine

## 2016-10-16 ENCOUNTER — Encounter: Payer: Self-pay | Admitting: Internal Medicine

## 2016-10-16 DIAGNOSIS — F329 Major depressive disorder, single episode, unspecified: Secondary | ICD-10-CM | POA: Diagnosis not present

## 2016-10-16 DIAGNOSIS — E034 Atrophy of thyroid (acquired): Secondary | ICD-10-CM | POA: Diagnosis not present

## 2016-10-16 DIAGNOSIS — I10 Essential (primary) hypertension: Secondary | ICD-10-CM | POA: Diagnosis not present

## 2016-10-16 DIAGNOSIS — S9031XS Contusion of right foot, sequela: Secondary | ICD-10-CM

## 2016-10-16 DIAGNOSIS — F419 Anxiety disorder, unspecified: Secondary | ICD-10-CM | POA: Diagnosis not present

## 2016-10-16 NOTE — Assessment & Plan Note (Signed)
Doxepin at hs 

## 2016-10-16 NOTE — Assessment & Plan Note (Signed)
Tranxene prn , Lorazepam prn, Doxepine at hs Pt taking meds at night (not when driving)  Potential benefits of a long term benzodiazepines  use as well as potential risks  and complications were explained to the patient and were aknowledged.  

## 2016-10-16 NOTE — Patient Instructions (Addendum)
Check BP at home

## 2016-10-16 NOTE — Assessment & Plan Note (Addendum)
Toprol, Nifedipine Check BP at home

## 2016-10-16 NOTE — Assessment & Plan Note (Signed)
Levothroid °Labs °

## 2016-10-16 NOTE — Assessment & Plan Note (Signed)
Resolved

## 2016-10-16 NOTE — Progress Notes (Signed)
Subjective:  Patient ID: [name], female    DOB: 08/01/1945  Age: 71 y.o. MRN: [id number]  CC: No chief complaint on file.   HPI [name] StallRuby C [name] presents for HTN, anxiety, dyslipidemia and to re-check R foot f/u  Outpatient Medications Prior to Visit  Medication Sig Dispense Refill  . aspirin 81 MG EC tablet Take 81 mg by mouth daily.      . cephALEXin (KEFLEX) 500 MG capsule Take 1 capsule (500 mg total) by mouth 4 (four) times daily. 20 capsule 0  . Cholecalciferol 1000 UNITS tablet Take 1,000 Units by mouth daily.      . clorazepate (TRANXENE) 15 MG tablet Take 1 tablet (15 mg total) by mouth 3 (three) times daily as needed. for anxiety 270 tablet 0  . doxepin (SINEQUAN) 50 MG capsule Take 3 capsules (150 mg total) by mouth at bedtime. 270 capsule 3  . estradiol cypionate (DEPO-ESTRADIOL) 5 MG/ML injection Use 0.3 ml every 18-25 days IM 5 mL 3  . furosemide (LASIX) 40 MG tablet Take 1 tablet (40 mg total) by mouth 2 (two) times daily. 180 tablet 3  . levothyroxine (SYNTHROID, LEVOTHROID) 125 MCG tablet Take 1 tablet (125 mcg total) by mouth daily. 90 tablet 3  . LORazepam (ATIVAN) 0.5 MG tablet Take 1 tablet (0.5 mg total) by mouth at bedtime. 90 tablet 1  . metoprolol (LOPRESSOR) 50 MG tablet Take 1 tablet (50 mg total) by mouth 3 (three) times daily. 270 tablet 3  . mupirocin ointment (BACTROBAN) 2 % Use w/dressing change qd-bid 15 g 0  . NIFEdipine (PROCARDIA-XL/ADALAT-CC/NIFEDICAL-XL) 30 MG 24 hr tablet Take 1 tablet (30 mg total) by mouth daily. 90 tablet 3  . nitroGLYCERIN (NITROLINGUAL) 0.4 MG/SPRAY spray PLACE 1 SPRAY UNDER THE TONGUE EVERY 5 MINUTES AS NEEDED FOR CHEST PAIN, MAY REPEAT 3 TIMES 12 g 0  . potassium chloride SA (KLOR-CON M20) 20 MEQ tablet Take 1 tablet (20 mEq total) by mouth 2 (two) times daily. 180 tablet 3   Facility-Administered Medications Prior to Visit  Medication Dose Route Frequency Provider Last Rate Last Dose  . estradiol cypionate (DEPO-ESTRADIOL)  5 MG/ML injection 1 mg  1 mg Intramuscular Q30 days Plotnikov, Georgina QuintAleksei V, MD   1 mg at 01/31/15 0933    ROS Review of Systems  Constitutional: Negative for activity change, appetite change, chills, fatigue and unexpected weight change.  HENT: Negative for congestion, mouth sores and sinus pressure.   Eyes: Negative for visual disturbance.  Respiratory: Negative for cough and chest tightness.   Gastrointestinal: Negative for abdominal pain and nausea.  Genitourinary: Negative for difficulty urinating, frequency and vaginal pain.  Musculoskeletal: Negative for back pain and gait problem.  Skin: Negative for pallor and rash.  Neurological: Negative for dizziness, tremors, weakness, numbness and headaches.  Psychiatric/Behavioral: Negative for confusion and sleep disturbance. The patient is nervous/anxious.     Objective:  Pulse 73   Temp 97.5 F (36.4 C) (Oral)   Ht 5\' 8"  (1.727 m)   Wt 167 lb (75.8 kg)   SpO2 98%   BMI 25.39 kg/m   BP Readings from Last 3 Encounters:  09/27/16 (!) 158/84  09/17/16 (!) 146/80  09/11/16 (!) 148/84    Wt Readings from Last 3 Encounters:  10/16/16 167 lb (75.8 kg)  09/27/16 164 lb 1.3 oz (74.4 kg)  09/17/16 166 lb (75.3 kg)    Physical Exam  Constitutional: She appears well-developed. No distress.  HENT:  Head: Normocephalic.  Right Ear: External ear normal.  Left Ear: External ear normal.  Nose: Nose normal.  Mouth/Throat: Oropharynx is clear and moist.  Eyes: Conjunctivae are normal. Pupils are equal, round, and reactive to light. Right eye exhibits no discharge. Left eye exhibits no discharge.  Neck: Normal range of motion. Neck supple. No JVD present. No tracheal deviation present. No thyromegaly present.  Cardiovascular: Normal rate, regular rhythm and normal heart sounds.   Pulmonary/Chest: No stridor. No respiratory distress. She has no wheezes.  Abdominal: Soft. Bowel sounds are normal. She exhibits no distension and no mass.  There is no tenderness. There is no rebound and no guarding.  Musculoskeletal: She exhibits no edema or tenderness.  Lymphadenopathy:    She has no cervical adenopathy.  Neurological: She displays normal reflexes. No cranial nerve deficit. She exhibits normal muscle tone. Coordination normal.  Skin: No rash noted. No erythema.  Psychiatric: She has a normal mood and affect. Her behavior is normal. Judgment and thought content normal.  R foot w/trace edema  Lab Results  Component Value Date   WBC 7.0 08/12/2013   HGB 12.9 08/12/2013   HCT 38.4 08/12/2013   PLT 286.0 08/12/2013   GLUCOSE 94 08/03/2014   CHOL 264 (H) 01/10/2012   TRIG 197.0 (H) 01/10/2012   HDL 54.70 01/10/2012   LDLDIRECT 188.2 01/10/2012   LDLCALC 76 03/21/2009   ALT 12 08/03/2014   AST 17 08/03/2014   NA 137 08/03/2014   K 4.1 08/03/2014   CL 103 08/03/2014   CREATININE 1.29 (H) 12/24/2014   BUN 18 12/24/2014   CO2 30 08/03/2014   TSH 3.30 08/03/2014   INR 1.09 08/22/2009    No results found.  Assessment & Plan:   There are no diagnoses linked to this encounter. I am having Ms. Abood maintain her aspirin, Cholecalciferol, estradiol cypionate, NIFEdipine, doxepin, metoprolol tartrate, furosemide, levothyroxine, clorazepate, LORazepam, potassium chloride SA, nitroGLYCERIN, cephALEXin, and mupirocin ointment. We will continue to administer estradiol cypionate.  No orders of the defined types were placed in this encounter.    Follow-up: No Follow-up on file.  Sonda Primes, MD

## 2016-10-17 ENCOUNTER — Encounter: Payer: Self-pay | Admitting: Cardiovascular Disease

## 2016-11-02 ENCOUNTER — Other Ambulatory Visit (INDEPENDENT_AMBULATORY_CARE_PROVIDER_SITE_OTHER): Payer: Medicare Other

## 2016-11-02 DIAGNOSIS — S9031XS Contusion of right foot, sequela: Secondary | ICD-10-CM | POA: Diagnosis not present

## 2016-11-02 DIAGNOSIS — E034 Atrophy of thyroid (acquired): Secondary | ICD-10-CM

## 2016-11-02 DIAGNOSIS — I10 Essential (primary) hypertension: Secondary | ICD-10-CM

## 2016-11-02 DIAGNOSIS — F419 Anxiety disorder, unspecified: Secondary | ICD-10-CM | POA: Diagnosis not present

## 2016-11-02 LAB — HEPATIC FUNCTION PANEL
ALT: 8 U/L (ref 0–35)
AST: 12 U/L (ref 0–37)
Albumin: 3.9 g/dL (ref 3.5–5.2)
Alkaline Phosphatase: 83 U/L (ref 39–117)
Bilirubin, Direct: 0 mg/dL (ref 0.0–0.3)
Total Bilirubin: 0.5 mg/dL (ref 0.2–1.2)
Total Protein: 6.6 g/dL (ref 6.0–8.3)

## 2016-11-02 LAB — URINALYSIS
Bilirubin Urine: NEGATIVE
Hgb urine dipstick: NEGATIVE
Ketones, ur: NEGATIVE
Leukocytes, UA: NEGATIVE
Nitrite: NEGATIVE
Specific Gravity, Urine: >=1.03 — AB (ref 1.000–1.030)
Total Protein, Urine: NEGATIVE
Urine Glucose: NEGATIVE
Urobilinogen, UA: 0.2 (ref 0.0–1.0)
pH: 5.5 (ref 5.0–8.0)

## 2016-11-02 LAB — CBC WITH DIFFERENTIAL/PLATELET
Basophils Absolute: 0.1 10*3/uL (ref 0.0–0.1)
Basophils Relative: 0.9 % (ref 0.0–3.0)
Eosinophils Absolute: 0.2 10*3/uL (ref 0.0–0.7)
Eosinophils Relative: 2.9 % (ref 0.0–5.0)
HCT: 37.1 % (ref 36.0–46.0)
Hemoglobin: 12.4 g/dL (ref 12.0–15.0)
Lymphocytes Relative: 26.7 % (ref 12.0–46.0)
Lymphs Abs: 2 10*3/uL (ref 0.7–4.0)
MCHC: 33.3 g/dL (ref 30.0–36.0)
MCV: 93.6 fl (ref 78.0–100.0)
Monocytes Absolute: 0.5 10*3/uL (ref 0.1–1.0)
Monocytes Relative: 7.2 % (ref 3.0–12.0)
Neutro Abs: 4.7 10*3/uL (ref 1.4–7.7)
Neutrophils Relative %: 62.3 % (ref 43.0–77.0)
Platelets: 259 10*3/uL (ref 150.0–400.0)
RBC: 3.96 Mil/uL (ref 3.87–5.11)
RDW: 12.7 % (ref 11.5–15.5)
WBC: 7.5 10*3/uL (ref 4.0–10.5)

## 2016-11-02 LAB — TSH: TSH: 2.14 u[IU]/mL (ref 0.35–4.50)

## 2016-11-02 LAB — LIPID PANEL
Cholesterol: 236 mg/dL — ABNORMAL HIGH (ref 0–200)
HDL: 60.6 mg/dL (ref >39.00)
LDL Cholesterol: 155 mg/dL — ABNORMAL HIGH (ref 0–99)
NonHDL: 175.1
Total CHOL/HDL Ratio: 4
Triglycerides: 100 mg/dL (ref 0.0–149.0)
VLDL: 20 mg/dL (ref 0.0–40.0)

## 2016-11-05 ENCOUNTER — Ambulatory Visit (INDEPENDENT_AMBULATORY_CARE_PROVIDER_SITE_OTHER): Payer: Medicare Other | Admitting: Cardiovascular Disease

## 2016-11-05 ENCOUNTER — Encounter: Payer: Self-pay | Admitting: Cardiovascular Disease

## 2016-11-05 VITALS — BP 150/80 | HR 72 | Ht 68.0 in | Wt 165.1 lb

## 2016-11-05 DIAGNOSIS — I25810 Atherosclerosis of coronary artery bypass graft(s) without angina pectoris: Secondary | ICD-10-CM | POA: Diagnosis not present

## 2016-11-05 DIAGNOSIS — R0602 Shortness of breath: Secondary | ICD-10-CM | POA: Diagnosis not present

## 2016-11-05 NOTE — Progress Notes (Signed)
Cardiology Office Note Date:  11/05/2016   ID:  Racquel, Arkin 11/15/1945, MRN [id number]  PCP:  Tresa Garter, MD  Cardiologist:  Tonny Bollman, MD    Chief Complaint  Patient presents with  . Follow-up     History of Present Illness: [name] is a 71 y.o. female who presents for follow-up of CAD. She was last seen in March 2016. Last year she was under a lot of stress with her job and missed her appointment.  The patient has hypertension, nonobstructive CAD, and hyperlipidemia. She had remote PCI of the LAD. She underwent cardiac catheterization in 2011 and this demonstrated wide patency of her stent sites with no other significant CAD. She is intolerant to all lipid-lowering therapies. She's had white coat HTN and home readings range from 130-140/75-85 on average.   She feels well overall. She specifically denies chest pain or chest pressure. She does admit to exertional dyspnea but denies orthopnea, PND, leg swelling, or cough. Her shortness of breath has progressed over the past year. She denies wheezing.  Past Medical History:  Diagnosis Date  . Anxiety state, unspecified   . Bronchitis, acute   . Carotid bruit   . Coronary atherosclerosis of unspecified type of vessel, native or graft    multiple angioplasy procedures in 90's/early 2000's  . De Quervain's tenosynovitis   . Depressive disorder, not elsewhere classified   . Female bladder prolapse   . Lichen 2011   Vaginal  . Other and unspecified hyperlipidemia   . Unspecified essential hypertension   . Unspecified hypothyroidism     No past surgical history on file.  Current Outpatient Prescriptions  Medication Sig Dispense Refill  . aspirin 81 MG EC tablet Take 81 mg by mouth daily.      . cephALEXin (KEFLEX) 500 MG capsule Take 1 capsule (500 mg total) by mouth 4 (four) times daily. 20 capsule 0  . Cholecalciferol 1000 UNITS tablet Take 1,000 Units by mouth daily.      . clorazepate (TRANXENE) 15  MG tablet Take 1 tablet (15 mg total) by mouth 3 (three) times daily as needed. for anxiety 270 tablet 0  . doxepin (SINEQUAN) 50 MG capsule Take 3 capsules (150 mg total) by mouth at bedtime. 270 capsule 3  . estradiol cypionate (DEPO-ESTRADIOL) 5 MG/ML injection Use 0.3 ml every 18-25 days IM 5 mL 3  . furosemide (LASIX) 40 MG tablet Take 1 tablet (40 mg total) by mouth 2 (two) times daily. 180 tablet 3  . levothyroxine (SYNTHROID, LEVOTHROID) 125 MCG tablet Take 1 tablet (125 mcg total) by mouth daily. 90 tablet 3  . LORazepam (ATIVAN) 0.5 MG tablet Take 1 tablet (0.5 mg total) by mouth at bedtime. 90 tablet 1  . metoprolol (LOPRESSOR) 50 MG tablet Take 1 tablet (50 mg total) by mouth 3 (three) times daily. 270 tablet 3  . mupirocin ointment (BACTROBAN) 2 % Apply 1 application topically daily.    Marland Kitchen NIFEdipine (PROCARDIA-XL/ADALAT-CC/NIFEDICAL-XL) 30 MG 24 hr tablet Take 1 tablet (30 mg total) by mouth daily. 90 tablet 3  . nitroGLYCERIN (NITROLINGUAL) 0.4 MG/SPRAY spray PLACE 1 SPRAY UNDER THE TONGUE EVERY 5 MINUTES AS NEEDED FOR CHEST PAIN, MAY REPEAT 3 TIMES 12 g 0  . potassium chloride SA (KLOR-CON M20) 20 MEQ tablet Take 1 tablet (20 mEq total) by mouth 2 (two) times daily. 180 tablet 3   Current Facility-Administered Medications  Medication Dose Route Frequency Provider Last Rate Last Dose  .  estradiol cypionate (DEPO-ESTRADIOL) 5 MG/ML injection 1 mg  1 mg Intramuscular Q30 days Plotnikov, Georgina QuintAleksei V, MD   1 mg at 01/31/15 [id number]    Allergies:   Epinephrine; Isosorbide mononitrate; Lidocaine; Morphine; Propoxyphene n-acetaminophen; Alprazolam; Atorvastatin; Codeine; Ezetimibe; Prilosec [omeprazole]; Rosuvastatin; Simvastatin; Tetracycline; Amoxicillin; Ciprofloxacin; and Diphenhydramine hcl   Social History:  The patient  reports that she quit smoking about 25 years ago. She has never used smokeless tobacco. She reports that she does not drink alcohol or use drugs.   Family History:  The  patient's  family history includes Coronary artery disease in her other; Hypertension in her father, mother, and other.   ROS:  Please see the history of present illness.  All other systems are reviewed and negative.   PHYSICAL EXAM: VS:  BP (!) 150/80   Pulse 72   Ht 5\' 8"  (1.727 m)   Wt 165 lb 1.9 oz (74.9 kg)   BMI 25.11 kg/m  , BMI Body mass index is 25.11 kg/m. GEN: Well nourished, well developed, in no acute distress  HEENT: normal  Neck: no JVD, no masses. bilateral carotid bruits L > R Cardiac: RRR without murmur or gallop                Respiratory:  clear to auscultation bilaterally, normal work of breathing GI: soft, nontender, nondistended, + BS MS: no deformity or atrophy  Ext: no pretibial edema, pedal pulses 2+= bilaterally Skin: warm and dry, no rash Neuro:  Strength and sensation are intact Psych: euthymic mood, full affect  EKG:  EKG is ordered today. The ekg ordered today shows NSR 73 bpm, voltage criteria for LVH  Recent Labs: 11/02/2016: ALT 8; Hemoglobin 12.4; Platelets 259.0; TSH 2.14   Lipid Panel     Component Value Date/Time   CHOL 236 (H) 11/02/2016 1229   TRIG 100.0 11/02/2016 1229   HDL 60.60 11/02/2016 1229   CHOLHDL 4 11/02/2016 1229   VLDL 20.0 11/02/2016 1229   LDLCALC 155 (H) 11/02/2016 1229   LDLDIRECT 188.2 01/10/2012 1135      Wt Readings from Last 3 Encounters:  11/05/16 165 lb 1.9 oz (74.9 kg)  10/16/16 167 lb (75.8 kg)  09/27/16 164 lb 1.3 oz (74.4 kg)     Cardiac Studies Reviewed: Carotid Duplex 08/02/2016: Impressions Duplex imaging, with color Doppler, of the carotid arteries reveals intimal thickening in the CCA's, bilaterally. Heterogenous plaque in the bifurcations, bilaterally. The RICA velocities have increased since prior exam and remain in normal range. The LICA velocities remain in normal range and stable. The subclavian arteries are widely patent, with normal velocity flow on the right and elevated velocities  with turbulent flow on the left. The vertebral arteries are patent with antegrade flow on the right, aberrant flow on the left. Technologist Notes Plaque Plaque Examination Data cm/s cm/s Heterogeneous plaque, bilaterally. Essentially stable 1-39% RICA stenosis. Stable 1-39% LICA stenosis. Normal right subclavian artery; elevated velocities in the left. Patent vertebral arteries with antegrade flow on the right, aberrant flow on the left. f/u PRN  ASSESSMENT AND PLAN: 1.  CAD, native vessel, no angina: The patient has a history of coronary artery disease with stent patency at the time of her last cardiac catheterization. She will continue on her current medical program.  2. Hypertension: Home blood pressures reviewed and in range. She is treated with nifedipine and metoprolol. Admits to anxiety related to her physician office visits and history of whitecoat hypertension.  3. Shortness of breath: No  signs of congestive heart failure or valvular disease on her examination. However, will check echocardiogram to evaluate her diastolic dysfunction or other structural cardiac problems. Lung exam is unremarkable.  4. Hyperlipidemia: Intolerant to many lipid-lowering medications both statin and non-statin drugs. Continue to work on lifestyle modification.  Current medicines are reviewed with the patient today.  The patient does not have concerns regarding medicines.  Labs/ tests ordered today include:   Orders Placed This Encounter  Procedures  . EKG 12-Lead  . ECHOCARDIOGRAM COMPLETE   Disposition:   FU one year  Signed, Tonny Bollman, MD  11/05/2016 4:08 PM    [facility] [address] [place], [place], [place]  [address] Phone: [phone]; Fax: [phone]

## 2016-11-05 NOTE — Patient Instructions (Signed)

## 2016-11-13 ENCOUNTER — Encounter: Payer: Self-pay | Admitting: Internal Medicine

## 2016-11-13 ENCOUNTER — Ambulatory Visit (INDEPENDENT_AMBULATORY_CARE_PROVIDER_SITE_OTHER): Payer: Medicare Other | Admitting: Internal Medicine

## 2016-11-13 ENCOUNTER — Ambulatory Visit: Payer: Medicare Other | Admitting: Family Medicine

## 2016-11-13 DIAGNOSIS — S91209A Unspecified open wound of unspecified toe(s) with damage to nail, initial encounter: Secondary | ICD-10-CM

## 2016-11-13 DIAGNOSIS — E034 Atrophy of thyroid (acquired): Secondary | ICD-10-CM

## 2016-11-13 DIAGNOSIS — I10 Essential (primary) hypertension: Secondary | ICD-10-CM | POA: Diagnosis not present

## 2016-11-13 DIAGNOSIS — M7752 Other enthesopathy of left foot: Secondary | ICD-10-CM | POA: Diagnosis not present

## 2016-11-13 NOTE — Assessment & Plan Note (Signed)
Levothroid 

## 2016-11-13 NOTE — Assessment & Plan Note (Signed)
L big toenail injury - healing

## 2016-11-13 NOTE — Assessment & Plan Note (Addendum)
Good shoes 1/4 inch heel lift Dr Katrinka BlazingSmith

## 2016-11-13 NOTE — Progress Notes (Signed)
Subjective:  Patient ID: [name], female    DOB: 05-14-1946  Age: 71 y.o. MRN: [id number]  CC: No chief complaint on file.   HPI [name] presents for her L big toenail injury F/u HTN, anxiety C/o stress w/her little dog  Outpatient Medications Prior to Visit  Medication Sig Dispense Refill  . aspirin 81 MG EC tablet Take 81 mg by mouth daily.      . cephALEXin (KEFLEX) 500 MG capsule Take 1 capsule (500 mg total) by mouth 4 (four) times daily. 20 capsule 0  . Cholecalciferol 1000 UNITS tablet Take 1,000 Units by mouth daily.      . clorazepate (TRANXENE) 15 MG tablet Take 1 tablet (15 mg total) by mouth 3 (three) times daily as needed. for anxiety 270 tablet 0  . doxepin (SINEQUAN) 50 MG capsule Take 3 capsules (150 mg total) by mouth at bedtime. 270 capsule 3  . estradiol cypionate (DEPO-ESTRADIOL) 5 MG/ML injection Use 0.3 ml every 18-25 days IM 5 mL 3  . furosemide (LASIX) 40 MG tablet Take 1 tablet (40 mg total) by mouth 2 (two) times daily. 180 tablet 3  . levothyroxine (SYNTHROID, LEVOTHROID) 125 MCG tablet Take 1 tablet (125 mcg total) by mouth daily. 90 tablet 3  . LORazepam (ATIVAN) 0.5 MG tablet Take 1 tablet (0.5 mg total) by mouth at bedtime. 90 tablet 1  . metoprolol (LOPRESSOR) 50 MG tablet Take 1 tablet (50 mg total) by mouth 3 (three) times daily. 270 tablet 3  . mupirocin ointment (BACTROBAN) 2 % Apply 1 application topically daily.    Marland Kitchen NIFEdipine (PROCARDIA-XL/ADALAT-CC/NIFEDICAL-XL) 30 MG 24 hr tablet Take 1 tablet (30 mg total) by mouth daily. 90 tablet 3  . nitroGLYCERIN (NITROLINGUAL) 0.4 MG/SPRAY spray PLACE 1 SPRAY UNDER THE TONGUE EVERY 5 MINUTES AS NEEDED FOR CHEST PAIN, MAY REPEAT 3 TIMES 12 g 0  . potassium chloride SA (KLOR-CON M20) 20 MEQ tablet Take 1 tablet (20 mEq total) by mouth 2 (two) times daily. 180 tablet 3   Facility-Administered Medications Prior to Visit  Medication Dose Route Frequency Provider Last Rate Last Dose  . estradiol  cypionate (DEPO-ESTRADIOL) 5 MG/ML injection 1 mg  1 mg Intramuscular Q30 days Plotnikov, Georgina Quint, MD   1 mg at 01/31/15 0933    ROS Review of Systems  Constitutional: Negative for activity change, appetite change, chills, fatigue and unexpected weight change.  HENT: Negative for congestion, mouth sores and sinus pressure.   Eyes: Negative for visual disturbance.  Respiratory: Negative for cough and chest tightness.   Gastrointestinal: Negative for abdominal pain and nausea.  Genitourinary: Negative for difficulty urinating, frequency and vaginal pain.  Musculoskeletal: Negative for back pain and gait problem.  Skin: Negative for pallor and rash.  Neurological: Negative for dizziness, tremors, weakness, numbness and headaches.  Psychiatric/Behavioral: Negative for confusion and sleep disturbance. The patient is nervous/anxious.     Objective:  BP 132/80 (BP Location: Left Arm, Patient Position: Sitting, Cuff Size: Normal)   Pulse 61   Temp 97.3 F (36.3 C) (Oral)   Ht 5\' 8"  (1.727 m)   Wt 165 lb (74.8 kg)   SpO2 99%   BMI 25.09 kg/m   BP Readings from Last 3 Encounters:  11/13/16 132/80  11/05/16 (!) 150/80  10/16/16 138/86    Wt Readings from Last 3 Encounters:  11/13/16 165 lb (74.8 kg)  11/05/16 165 lb 1.9 oz (74.9 kg)  10/16/16 167 lb (75.8 kg)  Physical Exam  Constitutional: She appears well-developed. No distress.  HENT:  Head: Normocephalic.  Right Ear: External ear normal.  Left Ear: External ear normal.  Nose: Nose normal.  Mouth/Throat: Oropharynx is clear and moist.  Eyes: Conjunctivae are normal. Pupils are equal, round, and reactive to light. Right eye exhibits no discharge. Left eye exhibits no discharge.  Neck: Normal range of motion. Neck supple. No JVD present. No tracheal deviation present. No thyromegaly present.  Cardiovascular: Normal rate, regular rhythm and normal heart sounds.   Pulmonary/Chest: No stridor. No respiratory distress. She  has no wheezes.  Abdominal: Soft. Bowel sounds are normal. She exhibits no distension and no mass. There is no tenderness. There is no rebound and no guarding.  Musculoskeletal: She exhibits no edema or tenderness.  Lymphadenopathy:    She has no cervical adenopathy.  Neurological: She displays normal reflexes. No cranial nerve deficit. She exhibits normal muscle tone. Coordination normal.  Skin: No rash noted. No erythema.  Psychiatric: She has a normal mood and affect. Her behavior is normal. Judgment and thought content normal.   L big toenail is missing; wound dressed  Lab Results  Component Value Date   WBC 7.5 11/02/2016   HGB 12.4 11/02/2016   HCT 37.1 11/02/2016   PLT 259.0 11/02/2016   GLUCOSE 94 08/03/2014   CHOL 236 (H) 11/02/2016   TRIG 100.0 11/02/2016   HDL 60.60 11/02/2016   LDLDIRECT 188.2 01/10/2012   LDLCALC 155 (H) 11/02/2016   ALT 8 11/02/2016   AST 12 11/02/2016   NA 137 08/03/2014   K 4.1 08/03/2014   CL 103 08/03/2014   CREATININE 1.29 (H) 12/24/2014   BUN 18 12/24/2014   CO2 30 08/03/2014   TSH 2.14 11/02/2016   INR 1.09 08/22/2009    No results found.  Assessment & Plan:   There are no diagnoses linked to this encounter. I am having Ms. Dorothyann GibbsFinch maintain her aspirin, Cholecalciferol, estradiol cypionate, NIFEdipine, doxepin, metoprolol tartrate, furosemide, levothyroxine, clorazepate, LORazepam, potassium chloride SA, nitroGLYCERIN, cephALEXin, and mupirocin ointment. We will continue to administer estradiol cypionate.  No orders of the defined types were placed in this encounter.    Follow-up: No Follow-up on file.  Sonda PrimesAlex Plotnikov, MD

## 2016-11-13 NOTE — Assessment & Plan Note (Signed)
Toprol, Nifedipine 

## 2016-11-13 NOTE — Patient Instructions (Signed)
Heel lift 1/4"    Achilles Tendinitis/bursitis Achilles tendinitis is inflammation of the tough, cord-like band that attaches the lower leg muscles to the heel bone (Achilles tendon). This is usually caused by overusing the tendon and the ankle joint. Achilles tendinitis usually gets better over time with treatment and caring for yourself at home. It can take weeks or months to heal completely. What are the causes? This condition may be caused by:  A sudden increase in exercise or activity, such as running.  Doing the same exercises or activities (such as jumping) over and over.  Not warming up calf muscles before exercising.  Exercising in shoes that are worn out or not made for exercise.  Having arthritis or a bone growth (spur) on the back of the heel bone. This can rub against the tendon and hurt it.  Age-related wear and tear. Tendons become less flexible with age and more likely to be injured.  What are the signs or symptoms? Common symptoms of this condition include:  Pain in the Achilles tendon or in the back of the leg, just above the heel. The pain usually gets worse with exercise.  Stiffness or soreness in the back of the leg, especially in the morning.  Swelling of the skin over the Achilles tendon.  Thickening of the tendon.  Bone spurs at the bottom of the Achilles tendon, near the heel.  Trouble standing on tiptoe.  How is this diagnosed? This condition is diagnosed based on your symptoms and a physical exam. You may have tests, including:  X-rays.  MRI.  How is this treated? The goal of treatment is to relieve symptoms and help your injury heal. Treatment may include:  Decreasing or stopping activities that caused the tendinitis. This may mean switching to low-impact exercises like biking or swimming.  Icing the injured area.  Doing physical therapy, including strengthening and stretching exercises.  NSAIDs to help relieve pain and  swelling.  Using supportive shoes, wraps, heel lifts, or a walking boot (air cast).  Surgery. This may be done if your symptoms do not improve after 6 months.  Using high-energy shock wave impulses to stimulate the healing process (extracorporeal shock wave therapy). This is rare.  Injection of medicines to help relieve inflammation (corticosteroids). This is rare.  Follow these instructions at home: If you have an air cast:  Wear the cast as told by your health care provider. Remove it only as told by your health care provider.  Loosen the cast if your toes tingle, become numb, or turn cold and blue. Activity  Gradually return to your normal activities once your health care provider approves. Do not do activities that cause pain. ? Consider doing low-impact exercises, like cycling or swimming.  If you have an air cast, ask your health care provider when it is safe for you to drive.  If physical therapy was prescribed, do exercises as told by your health care provider or physical therapist. Managing pain, stiffness, and swelling  Raise (elevate) your foot above the level of your heart while you are sitting or lying down.  Move your toes often to avoid stiffness and to lessen swelling.  If directed, put ice on the injured area: ? Put ice in a plastic bag. ? Place a towel between your skin and the bag. ? Leave the ice on for 20 minutes, 2-3 times a day General instructions  If directed, wrap your foot with an elastic bandage or other wrap. This can help keep  your tendon from moving too much while it heals. Your health care provider will show you how to wrap your foot correctly.  Wear supportive shoes or heel lifts only as told by your health care provider.  Take over-the-counter and prescription medicines only as told by your health care provider.  Keep all follow-up visits as told by your health care provider. This is important. Contact a health care provider if:  You have  symptoms that gets worse.  You have pain that does not get better with medicine.  You develop new, unexplained symptoms.  You develop warmth and swelling in your foot.  You have a fever. Get help right away if:  You have a sudden popping sound or sensation in your Achilles tendon followed by severe pain.  You cannot move your toes or foot.  You cannot put any weight on your foot. Summary  Achilles tendinitis is inflammation of the tough, cord-like band that attaches the lower leg muscles to the heel bone (Achilles tendon).  This condition is usually caused by overusing the tendon and the ankle joint. It can also be caused by arthritis or normal aging.  The most common symptoms of this condition include pain, swelling, or stiffness in the Achilles tendon or in the back of the leg.  This condition is usually treated with rest, NSAIDs, and physical therapy. This information is not intended to replace advice given to you by your health care provider. Make sure you discuss any questions you have with your health care provider. Document Released: 02/14/2005 Document Revised: 03/26/2016 Document Reviewed: 03/26/2016 Elsevier Interactive Patient Education  2017 [employer]

## 2016-11-14 ENCOUNTER — Encounter: Payer: Self-pay | Admitting: Internal Medicine

## 2016-11-16 ENCOUNTER — Other Ambulatory Visit: Payer: Self-pay

## 2016-11-16 ENCOUNTER — Ambulatory Visit (HOSPITAL_COMMUNITY): Payer: Medicare Other | Attending: Cardiology

## 2016-11-16 DIAGNOSIS — I25810 Atherosclerosis of coronary artery bypass graft(s) without angina pectoris: Secondary | ICD-10-CM | POA: Diagnosis not present

## 2016-11-16 DIAGNOSIS — R0602 Shortness of breath: Secondary | ICD-10-CM | POA: Diagnosis not present

## 2016-12-11 ENCOUNTER — Ambulatory Visit (INDEPENDENT_AMBULATORY_CARE_PROVIDER_SITE_OTHER): Payer: Medicare Other | Admitting: Internal Medicine

## 2016-12-11 ENCOUNTER — Encounter: Payer: Self-pay | Admitting: Internal Medicine

## 2016-12-11 DIAGNOSIS — M79672 Pain in left foot: Secondary | ICD-10-CM

## 2016-12-11 DIAGNOSIS — G8929 Other chronic pain: Secondary | ICD-10-CM

## 2016-12-11 MED ORDER — METHYLPREDNISOLONE ACETATE 40 MG/ML IJ SUSP
20.0000 mg | Freq: Once | INTRAMUSCULAR | Status: AC
Start: 2016-12-11 — End: 2016-12-11
  Administered 2016-12-11: 20 mg via INTRA_ARTICULAR

## 2016-12-11 NOTE — Patient Instructions (Signed)
You have a retro-calcaneal bursitis Use a "heel-hugger" elastic heel brace; ice, massage

## 2016-12-11 NOTE — Assessment & Plan Note (Addendum)
retro-calcaneal bursitis Use a "heel-hugger" elastic heel brace; ice, massage Will inject at the pt's request

## 2016-12-11 NOTE — Progress Notes (Signed)
Subjective:  Patient ID: [name], female    DOB: 10/08/1945  Age: 71 y.o. MRN: [id number]  CC: No chief complaint on file.   HPI [name] presents for L heel pain in the back  Outpatient Medications Prior to Visit  Medication Sig Dispense Refill  . aspirin 81 MG EC tablet Take 81 mg by mouth daily.      . cephALEXin (KEFLEX) 500 MG capsule Take 1 capsule (500 mg total) by mouth 4 (four) times daily. 20 capsule 0  . Cholecalciferol 1000 UNITS tablet Take 1,000 Units by mouth daily.      . clorazepate (TRANXENE) 15 MG tablet Take 1 tablet (15 mg total) by mouth 3 (three) times daily as needed. for anxiety 270 tablet 0  . doxepin (SINEQUAN) 50 MG capsule Take 3 capsules (150 mg total) by mouth at bedtime. 270 capsule 3  . estradiol cypionate (DEPO-ESTRADIOL) 5 MG/ML injection Use 0.3 ml every 18-25 days IM 5 mL 3  . furosemide (LASIX) 40 MG tablet Take 1 tablet (40 mg total) by mouth 2 (two) times daily. 180 tablet 3  . levothyroxine (SYNTHROID, LEVOTHROID) 125 MCG tablet Take 1 tablet (125 mcg total) by mouth daily. 90 tablet 3  . LORazepam (ATIVAN) 0.5 MG tablet Take 1 tablet (0.5 mg total) by mouth at bedtime. 90 tablet 1  . metoprolol (LOPRESSOR) 50 MG tablet Take 1 tablet (50 mg total) by mouth 3 (three) times daily. 270 tablet 3  . mupirocin ointment (BACTROBAN) 2 % Apply 1 application topically daily.    Marland Kitchen. NIFEdipine (PROCARDIA-XL/ADALAT-CC/NIFEDICAL-XL) 30 MG 24 hr tablet Take 1 tablet (30 mg total) by mouth daily. 90 tablet 3  . nitroGLYCERIN (NITROLINGUAL) 0.4 MG/SPRAY spray PLACE 1 SPRAY UNDER THE TONGUE EVERY 5 MINUTES AS NEEDED FOR CHEST PAIN, MAY REPEAT 3 TIMES 12 g 0  . potassium chloride SA (KLOR-CON M20) 20 MEQ tablet Take 1 tablet (20 mEq total) by mouth 2 (two) times daily. 180 tablet 3   Facility-Administered Medications Prior to Visit  Medication Dose Route Frequency Provider Last Rate Last Dose  . estradiol cypionate (DEPO-ESTRADIOL) 5 MG/ML injection 1 mg  1  mg Intramuscular Q30 days Plotnikov, Georgina QuintAleksei V, MD   1 mg at 01/31/15 0933    ROS Review of Systems  Constitutional: Negative for activity change, appetite change, chills, fatigue and unexpected weight change.  HENT: Negative for congestion, mouth sores and sinus pressure.   Eyes: Negative for visual disturbance.  Respiratory: Negative for cough and chest tightness.   Gastrointestinal: Negative for abdominal pain and nausea.  Genitourinary: Negative for difficulty urinating, frequency and vaginal pain.  Musculoskeletal: Positive for arthralgias and gait problem. Negative for back pain.  Skin: Negative for pallor and rash.  Neurological: Negative for dizziness, tremors, weakness, numbness and headaches.  Psychiatric/Behavioral: Negative for confusion and sleep disturbance.    Objective:  BP 136/84 (BP Location: Left Arm, Patient Position: Sitting, Cuff Size: Normal)   Pulse 60   Temp 97.6 F (36.4 C) (Oral)   Ht 5\' 8"  (1.727 m)   Wt 165 lb (74.8 kg)   SpO2 99%   BMI 25.09 kg/m   BP Readings from Last 3 Encounters:  12/11/16 136/84  11/13/16 132/80  11/05/16 (!) 150/80    Wt Readings from Last 3 Encounters:  12/11/16 165 lb (74.8 kg)  11/13/16 165 lb (74.8 kg)  11/05/16 165 lb 1.9 oz (74.9 kg)    Physical Exam  Constitutional: She appears well-developed. No  distress.  HENT:  Head: Normocephalic.  Right Ear: External ear normal.  Left Ear: External ear normal.  Nose: Nose normal.  Mouth/Throat: Oropharynx is clear and moist.  Eyes: Pupils are equal, round, and reactive to light. Conjunctivae are normal. Right eye exhibits no discharge. Left eye exhibits no discharge.  Neck: Normal range of motion. Neck supple. No JVD present. No tracheal deviation present. No thyromegaly present.  Cardiovascular: Normal rate, regular rhythm and normal heart sounds.   Pulmonary/Chest: No stridor. No respiratory distress. She has no wheezes.  Abdominal: Soft. Bowel sounds are normal.  She exhibits no distension and no mass. There is no tenderness. There is no rebound and no guarding.  Musculoskeletal: She exhibits tenderness. She exhibits no edema.  Lymphadenopathy:    She has no cervical adenopathy.  Neurological: She displays normal reflexes. No cranial nerve deficit. She exhibits normal muscle tone. Coordination normal.  Skin: No rash noted. No erythema.  Psychiatric: She has a normal mood and affect. Her behavior is normal. Judgment and thought content normal.   retro-calcaneal bursa is very tender Plantar fascia NT Achilles tendon NT  Procedure:steroid injection  Indication:  retro-calcaneal bursitis   Risks including bleeding, infection, unsuccessful procedure and others were explained to the patient in detail. The pt agreed to proceed. The patient was placed in the decubitus position. The area was prepped with betadine and alcohol. The injection was carried out with a mixture of 20 mg of Depomedrol and 1/2 cc of Lidocaine 2%. Band-Aid applied.  Tolerated well. Complications - none. Post-procedure instructions were provided.     Lab Results  Component Value Date   WBC 7.5 11/02/2016   HGB 12.4 11/02/2016   HCT 37.1 11/02/2016   PLT 259.0 11/02/2016   GLUCOSE 94 08/03/2014   CHOL 236 (H) 11/02/2016   TRIG 100.0 11/02/2016   HDL 60.60 11/02/2016   LDLDIRECT 188.2 01/10/2012   LDLCALC 155 (H) 11/02/2016   ALT 8 11/02/2016   AST 12 11/02/2016   NA 137 08/03/2014   K 4.1 08/03/2014   CL 103 08/03/2014   CREATININE 1.29 (H) 12/24/2014   BUN 18 12/24/2014   CO2 30 08/03/2014   TSH 2.14 11/02/2016   INR 1.09 08/22/2009    No results found.  Assessment & Plan:   There are no diagnoses linked to this encounter. I am having [name] maintain her aspirin, Cholecalciferol, estradiol cypionate, NIFEdipine, doxepin, metoprolol tartrate, furosemide, levothyroxine, clorazepate, LORazepam, potassium chloride SA, nitroGLYCERIN, cephALEXin, and mupirocin  ointment. We will continue to administer estradiol cypionate.  No orders of the defined types were placed in this encounter.    Follow-up: No Follow-up on file.  Sonda Primes, MD

## 2016-12-12 ENCOUNTER — Telehealth: Payer: Self-pay | Admitting: Internal Medicine

## 2016-12-12 NOTE — Telephone Encounter (Signed)
Pls advise on msg below.../lmb 

## 2016-12-12 NOTE — Telephone Encounter (Signed)
Ice Elevate foot Aleve x 2 days It could be a reaction to crystals. It should get better in 24-48 h Thx

## 2016-12-12 NOTE — Telephone Encounter (Signed)
Pt had appt with Dr. Posey ReaPlotnikov yesterday & received an injection in her L heel.  Pt came by the office stating she is in a lot of pain today.  Pt went home yesterday & follwed MD instructions to wear a compression sock & ice the area.  The pain today is on the outside of her ankle to the left of the injection site.  Her ankle feels warm to the touch in that area of her ankle.  Pt wants to know what she can do for some relief from the pain.

## 2016-12-13 NOTE — Telephone Encounter (Signed)
Notified pt w/MD response. She states when sh got home yesterday she put some ice on it and elevated it, and it started feeling a little better. As of this morning still little sore, but not like b4...Raechel Chute/lmb

## 2016-12-18 ENCOUNTER — Other Ambulatory Visit: Payer: Self-pay | Admitting: Internal Medicine

## 2016-12-18 MED ORDER — NITROGLYCERIN 0.4 MG/SPRAY TL SOLN: 1.0000 | 0 refills | Status: DC | PRN

## 2016-12-28 ENCOUNTER — Other Ambulatory Visit (INDEPENDENT_AMBULATORY_CARE_PROVIDER_SITE_OTHER): Payer: Medicare Other

## 2016-12-28 ENCOUNTER — Ambulatory Visit (INDEPENDENT_AMBULATORY_CARE_PROVIDER_SITE_OTHER): Payer: Medicare Other | Admitting: Internal Medicine

## 2016-12-28 ENCOUNTER — Encounter: Payer: Self-pay | Admitting: Internal Medicine

## 2016-12-28 DIAGNOSIS — M25511 Pain in right shoulder: Secondary | ICD-10-CM | POA: Diagnosis not present

## 2016-12-28 DIAGNOSIS — M7521 Bicipital tendinitis, right shoulder: Secondary | ICD-10-CM | POA: Diagnosis not present

## 2016-12-28 DIAGNOSIS — M25411 Effusion, right shoulder: Secondary | ICD-10-CM | POA: Insufficient documentation

## 2016-12-28 DIAGNOSIS — G8929 Other chronic pain: Secondary | ICD-10-CM | POA: Diagnosis not present

## 2016-12-28 DIAGNOSIS — M79672 Pain in left foot: Secondary | ICD-10-CM | POA: Diagnosis not present

## 2016-12-28 DIAGNOSIS — M436 Torticollis: Secondary | ICD-10-CM | POA: Diagnosis not present

## 2016-12-28 LAB — BASIC METABOLIC PANEL
BUN: 16 mg/dL (ref 6–23)
CO2: 28 mEq/L (ref 19–32)
Calcium: 8.9 mg/dL (ref 8.4–10.5)
Chloride: 103 mEq/L (ref 96–112)
Creatinine, Ser: 1.2 mg/dL (ref 0.40–1.20)
GFR: 46.76 mL/min — ABNORMAL LOW (ref >60.00)
Glucose, Bld: 91 mg/dL (ref 70–99)
Potassium: 4 mEq/L (ref 3.5–5.1)
Sodium: 139 mEq/L (ref 135–145)

## 2016-12-28 LAB — SEDIMENTATION RATE: Sed Rate: 25 mm/hr (ref 0–30)

## 2016-12-28 LAB — URIC ACID: Uric Acid, Serum: 8.8 mg/dL — ABNORMAL HIGH (ref 2.4–7.0)

## 2016-12-28 MED ORDER — METHYLPREDNISOLONE 4 MG PO TBPK: ORAL_TABLET | ORAL | 0 refills | Status: DC

## 2016-12-28 MED ORDER — METHYLPREDNISOLONE ACETATE 40 MG/ML IJ SUSP
40.0000 mg | Freq: Once | INTRAMUSCULAR | Status: AC
  Administered 2016-12-28: 40 mg via INTRA_ARTICULAR

## 2016-12-28 MED ORDER — METHYLPREDNISOLONE ACETATE 40 MG/ML IJ SUSP
20.0000 mg | Freq: Once | INTRAMUSCULAR | Status: AC
  Administered 2016-12-28: 20 mg

## 2016-12-28 NOTE — Progress Notes (Signed)
Subjective:  Patient ID: [name], female    DOB: [birth year]  Age: 71 y.o. MRN: [id number]  CC: No chief complaint on file.   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 aspirin 81 MG EC tablet Take 81 mg by mouth daily.      . cephALEXin (KEFLEX) 500 MG capsule Take 1 capsule (500 mg total) by mouth 4 (four) times daily. 20 capsule 0  . Cholecalciferol 1000 UNITS tablet Take 1,000 Units by mouth daily.      . clorazepate (TRANXENE) 15 MG tablet Take 1 tablet (15 mg total) by mouth 3 (three) times daily as needed. for anxiety 270 tablet 0  . doxepin (SINEQUAN) 50 MG capsule Take 3 capsules (150 mg total) by mouth at bedtime. 270 capsule 3  . estradiol cypionate (DEPO-ESTRADIOL) 5 MG/ML injection Use 0.3 ml every 18-25 days IM 5 mL 3  . furosemide (LASIX) 40 MG tablet Take 1 tablet (40 mg total) by mouth 2 (two) times daily. 180 tablet 3  . levothyroxine (SYNTHROID, LEVOTHROID) 125 MCG tablet Take 1 tablet (125 mcg total) by mouth daily. 90 tablet 3  . LORazepam (ATIVAN) 0.5 MG tablet Take 1 tablet (0.5 mg total) by mouth at bedtime. 90 tablet 1  . metoprolol (LOPRESSOR) 50 MG tablet Take 1 tablet (50 mg total) by mouth 3 (three) times daily. 270 tablet 3  . mupirocin ointment (BACTROBAN) 2 % Apply 1 application topically daily.    Marland Kitchen NIFEdipine (PROCARDIA-XL/ADALAT-CC/NIFEDICAL-XL) 30 MG 24 hr tablet Take 1 tablet (30 mg total) by mouth daily. 90 tablet 3  . nitroGLYCERIN (NITROLINGUAL) 0.4 MG/SPRAY spray PLACE 1 SPRAY UNDER THE TONGUE EVERY 5 MINUTES AS NEEDED FOR CHEST PAIN. MAY REPEAT 3 TIMES 12 g 0  . nitroGLYCERIN (NITROLINGUAL) 0.4 MG/SPRAY spray Place 1 spray under the tongue every 5 (five) minutes x 3 doses as needed for chest pain. 12 g 0  . potassium chloride SA (KLOR-CON M20) 20 MEQ tablet Take 1 tablet (20 mEq total) by mouth 2 (two) times daily. 180 tablet 3    Facility-Administered Medications Prior to Visit  Medication Dose Route Frequency Provider Last Rate Last Dose  . estradiol cypionate (DEPO-ESTRADIOL) 5 MG/ML injection 1 mg  1 mg Intramuscular Q30 days Plotnikov, Georgina Quint, MD   1 mg at 12/11/16 1614    ROS Review of Systems  Constitutional: Positive for fatigue. Negative for activity change, appetite change, chills and unexpected weight change.  HENT: Negative for congestion, mouth sores and sinus pressure.   Eyes: Negative for visual disturbance.  Respiratory: Negative for cough and chest tightness.   Gastrointestinal: Negative for abdominal pain and nausea.  Genitourinary: Negative for difficulty urinating, frequency and vaginal pain.  Musculoskeletal: Positive for arthralgias and back pain. Negative for gait problem.  Skin: Negative for pallor and rash.  Neurological: Negative for dizziness, tremors, weakness, numbness and headaches.  Psychiatric/Behavioral: Negative for confusion, sleep disturbance and suicidal ideas.    Objective:  BP (!) 152/86 (BP Location: Left Arm, Patient Position: Sitting, Cuff Size: Normal)   Pulse 67   Temp 97.8 F (36.6 C) (Oral)   Ht 5\' 8"  (1.727 m)   Wt 165 lb (74.8 kg)   SpO2 99%   BMI 25.09 kg/m   BP Readings from Last 3 Encounters:  12/28/16 (!) 152/86  12/11/16 136/84  11/13/16 132/80  Wt Readings from Last 3 Encounters:  12/28/16 165 lb (74.8 kg)  12/11/16 165 lb (74.8 kg)  11/13/16 165 lb (74.8 kg)    Physical Exam  Constitutional: She appears well-developed. No distress.  HENT:  Head: Normocephalic.  Right Ear: External ear normal.  Left Ear: External ear normal.  Nose: Nose normal.  Mouth/Throat: Oropharynx is clear and moist.  Eyes: Pupils are equal, round, and reactive to light. Conjunctivae are normal. Right eye exhibits no discharge. Left eye exhibits no discharge.  Neck: Normal range of motion. Neck supple. No JVD present. No tracheal deviation present. No  thyromegaly present.  Cardiovascular: Normal rate, regular rhythm and normal heart sounds.   Pulmonary/Chest: No stridor. No respiratory distress. She has no wheezes.  Abdominal: Soft. Bowel sounds are normal. She exhibits no distension and no mass. There is no tenderness. There is no rebound and no guarding.  Musculoskeletal: She exhibits tenderness. She exhibits no edema.  Lymphadenopathy:    She has no cervical adenopathy.  Neurological: She displays normal reflexes. No cranial nerve deficit. She exhibits normal muscle tone. Coordination normal.  Skin: No rash noted. No erythema.  Psychiatric: She has a normal mood and affect. Her behavior is normal. Judgment and thought content normal.  R lat shoulder is very tender L biceps tendon - tender    Procedure :Joint Injection,  R shoulder   Indication:  Subacromial bursitis with refractory  pain.   Risks including unsuccessful procedure , bleeding, infection, bruising, skin atrophy, "steroid flare-up" and others were explained to the patient in detail as well as the benefits. Informed consent was obtained and signed.   Tthe patient was placed in a comfortable position. Lateral approach was used. Skin was prepped with Betadine and alcohol  and anesthetized with a cooling spray. Then, a 5 cc syringe with a 2 inch long 24-gauge needle was used for a joint injection.. The needle was advanced  Into the subacromial space.The bursa was injected with 3 mL of 2% lidocaine and 40 mg of Depo-Medrol .  Band-Aid was applied.   Tolerated well. Complications: None. Good pain relief following the procedure.    Procedure :  Biceps tendon injection R   Indication:  Bicipital tendonitis with refractory pain.   Risks including unsuccessful procedure , bleeding, infection, bruising, skin atrophy and others were explained to the patient in detail as well as the benefits. Informed consent was obtained and signed.   Tthe patient was placed in a comfortable  position. Skin was prepped with Betadine and alcohol  and anesthetized with a cooling spray. Then, a 5 cc syringe with a 1.5 inch long 25-gauge needle was used for a tendon injection with 2 mL of 2% lidocaine and 20 mg of Depo-Medrol .  Band-Aid was applied.     Tolerated well. Complications: None. Good pain relief following the procedure.            Lab Results  Component Value Date   WBC 7.5 11/02/2016   HGB 12.4 11/02/2016   HCT 37.1 11/02/2016   PLT 259.0 11/02/2016   GLUCOSE 94 08/03/2014   CHOL 236 (H) 11/02/2016   TRIG 100.0 11/02/2016   HDL 60.60 11/02/2016   LDLDIRECT 188.2 01/10/2012   LDLCALC 155 (H) 11/02/2016   ALT 8 11/02/2016   AST 12 11/02/2016   NA 137 08/03/2014   K 4.1 08/03/2014   CL 103 08/03/2014   CREATININE 1.29 (H) 12/24/2014   BUN 18 12/24/2014   CO2 30 08/03/2014  TSH 2.14 11/02/2016   INR 1.09 08/22/2009    No results found.  Assessment & Plan:   There are no diagnoses linked to this encounter. I am having Ms. Evans maintain her aspirin, Cholecalciferol, estradiol cypionate, NIFEdipine, doxepin, metoprolol tartrate, furosemide, levothyroxine, clorazepate, LORazepam, potassium chloride SA, cephALEXin, mupirocin ointment, nitroGLYCERIN, and nitroGLYCERIN. We will continue to administer estradiol cypionate.  No orders of the defined types were placed in this encounter.    Follow-up: No Follow-up on file.  Sonda Primes, MD

## 2016-12-28 NOTE — Addendum Note (Signed)
Addended by: Scarlett PrestoFRIEDENBACH, BETTY on: 12/28/2016 03:51 PM   Modules accepted: Orders

## 2016-12-28 NOTE — Assessment & Plan Note (Addendum)
R - bursitis, biceps tendonitis Labs Will inject both

## 2016-12-28 NOTE — Assessment & Plan Note (Signed)
Medrol pack Neck pillow

## 2016-12-28 NOTE — Patient Instructions (Signed)
Postprocedure instructions :    A Band-Aid should be left on for 12 hours. Injection therapy is not a cure itself. It is used in conjunction with other modalities. You can use nonsteroidal anti-inflammatories like ibuprofen , hot and cold compresses. Rest is recommended in the next 24 hours. You need to report immediately  if fever, chills or any signs of infection develop. 

## 2016-12-28 NOTE — Assessment & Plan Note (Signed)
improved

## 2016-12-29 MED ORDER — ALLOPURINOL 100 MG PO TABS: 100.0000 mg | ORAL_TABLET | Freq: Every day | ORAL | 6 refills | Status: DC

## 2016-12-29 NOTE — Progress Notes (Signed)
Dear Mrs [name], Your uric acid (a gout test) was elevated. Please start Allopurinol 100 mg a day (Rx emailed) Sincerely, Sonda PrimesAlex Plotnikov, MD

## 2016-12-31 ENCOUNTER — Encounter: Payer: Self-pay | Admitting: Internal Medicine

## 2016-12-31 NOTE — Telephone Encounter (Signed)
Lab did not draw Rheumatoid Factor because it had an expected date on it and I tried to add it on this morning but the did not have the right tube to add it on.

## 2017-01-09 ENCOUNTER — Ambulatory Visit (INDEPENDENT_AMBULATORY_CARE_PROVIDER_SITE_OTHER): Payer: Medicare Other | Admitting: Internal Medicine

## 2017-01-09 DIAGNOSIS — M109 Gout, unspecified: Secondary | ICD-10-CM | POA: Insufficient documentation

## 2017-01-09 DIAGNOSIS — M25511 Pain in right shoulder: Secondary | ICD-10-CM | POA: Diagnosis not present

## 2017-01-09 DIAGNOSIS — I10 Essential (primary) hypertension: Secondary | ICD-10-CM

## 2017-01-09 MED ORDER — MELOXICAM 7.5 MG PO TABS: 7.5000 mg | ORAL_TABLET | Freq: Every day | ORAL | 0 refills | Status: DC

## 2017-01-09 MED ORDER — CLORAZEPATE DIPOTASSIUM 15 MG PO TABS: 15.0000 mg | ORAL_TABLET | Freq: Three times a day (TID) | ORAL | 1 refills | Status: DC | PRN

## 2017-01-09 MED ORDER — ESTRADIOL CYPIONATE 5 MG/ML IM OIL: TOPICAL_OIL | INTRAMUSCULAR | 3 refills | Status: DC

## 2017-01-09 MED ORDER — LORAZEPAM 0.5 MG PO TABS: 0.5000 mg | ORAL_TABLET | Freq: Every day | ORAL | 1 refills | Status: DC

## 2017-01-09 NOTE — Assessment & Plan Note (Signed)
On meds

## 2017-01-09 NOTE — Assessment & Plan Note (Signed)
Better Allopurinol  Sports med ref - Dr Katrinka Blazing

## 2017-01-09 NOTE — Assessment & Plan Note (Signed)
We started Allopurinol while on steroids

## 2017-01-09 NOTE — Progress Notes (Signed)
Subjective:  Patient ID: [name], female    DOB: Mar 16, 1946  Age: 71 y.o. MRN: [id number]  CC: No chief complaint on file.   HPI [name] presents for R shoulder and R arm pain - better F/u gout  Outpatient Medications Prior to Visit  Medication Sig Dispense Refill  . allopurinol (ZYLOPRIM) 100 MG tablet Take 1 tablet (100 mg total) by mouth daily. 30 tablet 6  . aspirin 81 MG EC tablet Take 81 mg by mouth daily.      . cephALEXin (KEFLEX) 500 MG capsule Take 1 capsule (500 mg total) by mouth 4 (four) times daily. 20 capsule 0  . Cholecalciferol 1000 UNITS tablet Take 1,000 Units by mouth daily.      . clorazepate (TRANXENE) 15 MG tablet Take 1 tablet (15 mg total) by mouth 3 (three) times daily as needed. for anxiety 270 tablet 0  . doxepin (SINEQUAN) 50 MG capsule Take 3 capsules (150 mg total) by mouth at bedtime. 270 capsule 3  . estradiol cypionate (DEPO-ESTRADIOL) 5 MG/ML injection Use 0.3 ml every 18-25 days IM 5 mL 3  . furosemide (LASIX) 40 MG tablet Take 1 tablet (40 mg total) by mouth 2 (two) times daily. 180 tablet 3  . levothyroxine (SYNTHROID, LEVOTHROID) 125 MCG tablet Take 1 tablet (125 mcg total) by mouth daily. 90 tablet 3  . LORazepam (ATIVAN) 0.5 MG tablet Take 1 tablet (0.5 mg total) by mouth at bedtime. 90 tablet 1  . methylPREDNISolone (MEDROL DOSEPAK) 4 MG TBPK tablet As directed 21 tablet 0  . metoprolol (LOPRESSOR) 50 MG tablet Take 1 tablet (50 mg total) by mouth 3 (three) times daily. 270 tablet 3  . mupirocin ointment (BACTROBAN) 2 % Apply 1 application topically daily.    Marland Kitchen NIFEdipine (PROCARDIA-XL/ADALAT-CC/NIFEDICAL-XL) 30 MG 24 hr tablet Take 1 tablet (30 mg total) by mouth daily. 90 tablet 3  . nitroGLYCERIN (NITROLINGUAL) 0.4 MG/SPRAY spray PLACE 1 SPRAY UNDER THE TONGUE EVERY 5 MINUTES AS NEEDED FOR CHEST PAIN. MAY REPEAT 3 TIMES 12 g 0  . nitroGLYCERIN (NITROLINGUAL) 0.4 MG/SPRAY spray Place 1 spray under the tongue every 5 (five) minutes x 3  doses as needed for chest pain. 12 g 0  . potassium chloride SA (KLOR-CON M20) 20 MEQ tablet Take 1 tablet (20 mEq total) by mouth 2 (two) times daily. 180 tablet 3   Facility-Administered Medications Prior to Visit  Medication Dose Route Frequency Provider Last Rate Last Dose  . estradiol cypionate (DEPO-ESTRADIOL) 5 MG/ML injection 1 mg  1 mg Intramuscular Q30 days Plotnikov, Georgina Quint, MD   1 mg at 12/11/16 1614    ROS Review of Systems  Constitutional: Negative for activity change, appetite change, chills, fatigue and unexpected weight change.  HENT: Negative for congestion, mouth sores and sinus pressure.   Eyes: Negative for visual disturbance.  Respiratory: Negative for cough and chest tightness.   Gastrointestinal: Negative for abdominal pain and nausea.  Genitourinary: Negative for difficulty urinating, frequency and vaginal pain.  Musculoskeletal: Positive for arthralgias and back pain. Negative for gait problem.  Skin: Negative for pallor and rash.  Neurological: Negative for dizziness, tremors, weakness, numbness and headaches.  Psychiatric/Behavioral: Negative for confusion and sleep disturbance.    Objective:  BP (!) 162/82 (BP Location: Left Arm, Patient Position: Sitting, Cuff Size: Normal)   Pulse (!) 52   Temp 97.7 F (36.5 C) (Oral)   Ht 5\' 8"  (1.727 m)   Wt 163 lb (73.9 kg)  SpO2 100%   BMI 24.78 kg/m   BP Readings from Last 3 Encounters:  01/09/17 (!) 162/82  12/28/16 (!) 152/86  12/11/16 136/84    Wt Readings from Last 3 Encounters:  01/09/17 163 lb (73.9 kg)  12/28/16 165 lb (74.8 kg)  12/11/16 165 lb (74.8 kg)    Physical Exam  Constitutional: She appears well-developed. No distress.  HENT:  Head: Normocephalic.  Right Ear: External ear normal.  Left Ear: External ear normal.  Nose: Nose normal.  Mouth/Throat: Oropharynx is clear and moist.  Eyes: Pupils are equal, round, and reactive to light. Conjunctivae are normal. Right eye exhibits  no discharge. Left eye exhibits no discharge.  Neck: Normal range of motion. Neck supple. No JVD present. No tracheal deviation present. No thyromegaly present.  Cardiovascular: Normal rate, regular rhythm and normal heart sounds.   Pulmonary/Chest: No stridor. No respiratory distress. She has no wheezes.  Abdominal: Soft. Bowel sounds are normal. She exhibits no distension and no mass. There is no tenderness. There is no rebound and no guarding.  Musculoskeletal: She exhibits tenderness. She exhibits no edema.  Lymphadenopathy:    She has no cervical adenopathy.  Neurological: She displays normal reflexes. No cranial nerve deficit. She exhibits normal muscle tone. Coordination normal.  Skin: No rash noted. No erythema.  Psychiatric: She has a normal mood and affect. Her behavior is normal. Judgment and thought content normal.  R shoulder is tender Bruising on forearms  Lab Results  Component Value Date   WBC 7.5 11/02/2016   HGB 12.4 11/02/2016   HCT 37.1 11/02/2016   PLT 259.0 11/02/2016   GLUCOSE 91 12/28/2016   CHOL 236 (H) 11/02/2016   TRIG 100.0 11/02/2016   HDL 60.60 11/02/2016   LDLDIRECT 188.2 01/10/2012   LDLCALC 155 (H) 11/02/2016   ALT 8 11/02/2016   AST 12 11/02/2016   NA 139 12/28/2016   K 4.0 12/28/2016   CL 103 12/28/2016   CREATININE 1.20 12/28/2016   BUN 16 12/28/2016   CO2 28 12/28/2016   TSH 2.14 11/02/2016   INR 1.09 08/22/2009    No results found.  Assessment & Plan:   There are no diagnoses linked to this encounter. I am having Ms. Zimny maintain her aspirin, Cholecalciferol, estradiol cypionate, NIFEdipine, doxepin, metoprolol tartrate, furosemide, levothyroxine, clorazepate, LORazepam, potassium chloride SA, cephALEXin, mupirocin ointment, nitroGLYCERIN, nitroGLYCERIN, methylPREDNISolone, and allopurinol. We will continue to administer estradiol cypionate.  No orders of the defined types were placed in this encounter.    Follow-up: No  Follow-up on file.  Sonda Primes, MD

## 2017-02-06 ENCOUNTER — Ambulatory Visit (INDEPENDENT_AMBULATORY_CARE_PROVIDER_SITE_OTHER): Payer: Medicare Other | Admitting: Internal Medicine

## 2017-02-06 ENCOUNTER — Encounter: Payer: Self-pay | Admitting: Internal Medicine

## 2017-02-06 DIAGNOSIS — F419 Anxiety disorder, unspecified: Secondary | ICD-10-CM

## 2017-02-06 DIAGNOSIS — M10011 Idiopathic gout, right shoulder: Secondary | ICD-10-CM

## 2017-02-06 DIAGNOSIS — E348 Other specified endocrine disorders: Secondary | ICD-10-CM

## 2017-02-06 DIAGNOSIS — M25511 Pain in right shoulder: Secondary | ICD-10-CM

## 2017-02-06 DIAGNOSIS — M7752 Other enthesopathy of left foot: Secondary | ICD-10-CM | POA: Diagnosis not present

## 2017-02-06 NOTE — Assessment & Plan Note (Signed)
Discussed.

## 2017-02-06 NOTE — Assessment & Plan Note (Signed)
Allopurinol Low dose Meloxicam to start

## 2017-02-06 NOTE — Assessment & Plan Note (Addendum)
Allopurinol Pt did not take Meloxicam yet. The pt did not sch w/Sports Med yet. Low dose Meloxicam to start

## 2017-02-06 NOTE — Assessment & Plan Note (Signed)
Allopurinol Resolved

## 2017-02-06 NOTE — Progress Notes (Signed)
Subjective:  Patient ID: [name], female    DOB: 05/18/1946  Age: 71 y.o. MRN: [id number]  CC: No chief complaint on file.   HPI [name] presents for the R shoulder pain. Gout medicine is making me sleepy. Pt did not take Meloxicam yet. The pt did not sch w/Sports Med yet.  Outpatient Medications Prior to Visit  Medication Sig Dispense Refill  . allopurinol (ZYLOPRIM) 100 MG tablet Take 1 tablet (100 mg total) by mouth daily. 30 tablet 6  . aspirin 81 MG EC tablet Take 81 mg by mouth daily.      . Cholecalciferol 1000 UNITS tablet Take 1,000 Units by mouth daily.      . clorazepate (TRANXENE) 15 MG tablet Take 1 tablet (15 mg total) by mouth 3 (three) times daily as needed. for anxiety 270 tablet 1  . doxepin (SINEQUAN) 50 MG capsule Take 3 capsules (150 mg total) by mouth at bedtime. 270 capsule 3  . estradiol cypionate (DEPO-ESTRADIOL) 5 MG/ML injection Use 0.3 ml every 18-25 days IM 5 mL 3  . furosemide (LASIX) 40 MG tablet Take 1 tablet (40 mg total) by mouth 2 (two) times daily. 180 tablet 3  . levothyroxine (SYNTHROID, LEVOTHROID) 125 MCG tablet Take 1 tablet (125 mcg total) by mouth daily. 90 tablet 3  . LORazepam (ATIVAN) 0.5 MG tablet Take 1 tablet (0.5 mg total) by mouth at bedtime. 90 tablet 1  . methylPREDNISolone (MEDROL DOSEPAK) 4 MG TBPK tablet As directed 21 tablet 0  . metoprolol (LOPRESSOR) 50 MG tablet Take 1 tablet (50 mg total) by mouth 3 (three) times daily. 270 tablet 3  . mupirocin ointment (BACTROBAN) 2 % Apply 1 application topically daily.    Marland Kitchen NIFEdipine (PROCARDIA-XL/ADALAT-CC/NIFEDICAL-XL) 30 MG 24 hr tablet Take 1 tablet (30 mg total) by mouth daily. 90 tablet 3  . nitroGLYCERIN (NITROLINGUAL) 0.4 MG/SPRAY spray PLACE 1 SPRAY UNDER THE TONGUE EVERY 5 MINUTES AS NEEDED FOR CHEST PAIN. MAY REPEAT 3 TIMES 12 g 0  . potassium chloride SA (KLOR-CON M20) 20 MEQ tablet Take 1 tablet (20 mEq total) by mouth 2 (two) times daily. 180 tablet 3  . meloxicam  (MOBIC) 7.5 MG tablet Take 1 tablet (7.5 mg total) by mouth daily. Take x 2 weeks, then as needed. Take w/food (Patient not taking: Reported on 02/06/2017) 30 tablet 0   Facility-Administered Medications Prior to Visit  Medication Dose Route Frequency Provider Last Rate Last Dose  . estradiol cypionate (DEPO-ESTRADIOL) 5 MG/ML injection 1 mg  1 mg Intramuscular Q30 days Plotnikov, Georgina Quint, MD   1 mg at 12/11/16 1614    ROS Review of Systems  Constitutional: Positive for fatigue. Negative for activity change, appetite change, chills and unexpected weight change.  HENT: Negative for congestion, mouth sores and sinus pressure.   Eyes: Negative for visual disturbance.  Respiratory: Negative for cough and chest tightness.   Gastrointestinal: Negative for abdominal pain and nausea.  Genitourinary: Negative for difficulty urinating, frequency and vaginal pain.  Musculoskeletal: Positive for arthralgias. Negative for back pain and gait problem.  Skin: Negative for pallor and rash.  Neurological: Negative for dizziness, tremors, weakness, numbness and headaches.  Psychiatric/Behavioral: Positive for sleep disturbance. Negative for confusion. The patient is nervous/anxious.     Objective:  BP (!) 170/82 (BP Location: Left Arm, Patient Position: Sitting, Cuff Size: Normal)   Pulse 62   Temp 97.9 F (36.6 C) (Oral)   Ht  (1.727 m)  Wt 164 lb (74.4 kg)   SpO2 98%   BMI 24.94 kg/m   BP Readings from Last 3 Encounters:  02/06/17 (!) 170/82  01/09/17 (!) 162/82  12/28/16 (!) 152/86    Wt Readings from Last 3 Encounters:  02/06/17 164 lb (74.4 kg)  01/09/17 163 lb (73.9 kg)  12/28/16 165 lb (74.8 kg)    Physical Exam  Constitutional: She appears well-developed. No distress.  HENT:  Head: Normocephalic.  Right Ear: External ear normal.  Left Ear: External ear normal.  Nose: Nose normal.  Mouth/Throat: Oropharynx is clear and moist.  Eyes: Pupils are equal, round, and reactive  to light. Conjunctivae are normal. Right eye exhibits no discharge. Left eye exhibits no discharge.  Neck: Normal range of motion. Neck supple. No JVD present. No tracheal deviation present. No thyromegaly present.  Cardiovascular: Normal rate, regular rhythm and normal heart sounds.   Pulmonary/Chest: No stridor. No respiratory distress. She has no wheezes.  Abdominal: Soft. Bowel sounds are normal. She exhibits no distension and no mass. There is no tenderness. There is no rebound and no guarding.  Musculoskeletal: She exhibits tenderness. She exhibits no edema.  Lymphadenopathy:    She has no cervical adenopathy.  Neurological: She displays normal reflexes. No cranial nerve deficit. She exhibits normal muscle tone. Coordination normal.  Skin: No rash noted. No erythema.  Psychiatric: She has a normal mood and affect. Her behavior is normal. Judgment and thought content normal.    Lab Results  Component Value Date   WBC 7.5 11/02/2016   HGB 12.4 11/02/2016   HCT 37.1 11/02/2016   PLT 259.0 11/02/2016   GLUCOSE 91 12/28/2016   CHOL 236 (H) 11/02/2016   TRIG 100.0 11/02/2016   HDL 60.60 11/02/2016   LDLDIRECT 188.2 01/10/2012   LDLCALC 155 (H) 11/02/2016   ALT 8 11/02/2016   AST 12 11/02/2016   NA 139 12/28/2016   K 4.0 12/28/2016   CL 103 12/28/2016   CREATININE 1.20 12/28/2016   BUN 16 12/28/2016   CO2 28 12/28/2016   TSH 2.14 11/02/2016   INR 1.09 08/22/2009    No results found.  Assessment & Plan:   There are no diagnoses linked to this encounter. I have discontinued Ms. Duzan's meloxicam. I am also having her maintain her aspirin, Cholecalciferol, NIFEdipine, doxepin, metoprolol tartrate, furosemide, levothyroxine, potassium chloride SA, mupirocin ointment, nitroGLYCERIN, methylPREDNISolone, allopurinol, clorazepate, LORazepam, and estradiol cypionate. We will continue to administer estradiol cypionate.  No orders of the defined types were placed in this  encounter.    Follow-up: No Follow-up on file.  Sonda Primes, MD

## 2017-02-06 NOTE — Patient Instructions (Signed)
Cesar Millan's Short Guide to a Happy Dog

## 2017-02-13 ENCOUNTER — Encounter: Payer: Self-pay | Admitting: Internal Medicine

## 2017-02-21 ENCOUNTER — Encounter: Payer: Self-pay | Admitting: Internal Medicine

## 2017-02-22 ENCOUNTER — Encounter: Payer: Self-pay | Admitting: Internal Medicine

## 2017-02-22 ENCOUNTER — Encounter: Payer: Self-pay | Admitting: Family Medicine

## 2017-02-22 ENCOUNTER — Ambulatory Visit (INDEPENDENT_AMBULATORY_CARE_PROVIDER_SITE_OTHER)
Admission: RE | Admit: 2017-02-22 | Discharge: 2017-02-22 | Disposition: A | Discharge status: Home / Self Care | Payer: Medicare Other | Source: Ambulatory Visit | Attending: Family Medicine | Admitting: Family Medicine

## 2017-02-22 ENCOUNTER — Other Ambulatory Visit: Payer: Medicare Other

## 2017-02-22 ENCOUNTER — Ambulatory Visit: Payer: Self-pay

## 2017-02-22 ENCOUNTER — Ambulatory Visit (INDEPENDENT_AMBULATORY_CARE_PROVIDER_SITE_OTHER): Payer: Medicare Other | Admitting: Family Medicine

## 2017-02-22 VITALS — BP 158/78 | HR 70 | Temp 97.9°F | Ht 68.0 in | Wt 166.1 lb

## 2017-02-22 DIAGNOSIS — M25511 Pain in right shoulder: Secondary | ICD-10-CM

## 2017-02-22 DIAGNOSIS — M25411 Effusion, right shoulder: Secondary | ICD-10-CM | POA: Diagnosis not present

## 2017-02-22 MED ORDER — METHYLPREDNISOLONE ACETATE 40 MG/ML IJ SUSP
40.0000 mg | Freq: Once | INTRAMUSCULAR | Status: AC
  Administered 2017-02-22: 40 mg via INTRAMUSCULAR

## 2017-02-22 NOTE — Patient Instructions (Signed)
Thank you for coming in,   We will call you with the results from today.    Please feel free to call with any questions or concerns at any time, at [phone]. --Dr. Schmitz  

## 2017-02-22 NOTE — Progress Notes (Signed)
[name] - 71 y.o. female MRN [id number]  Date of birth: 1945-11-17  SUBJECTIVE:  Including CC & ROS.  Chief Complaint  Patient presents with  . Shoulder Pain    Patient is here today C/O right shoulder pain and has been seen 3 times.  Pain is severe in nature.  . Ankle Pain    Patient is also C/O her left ankle being painful after she got on her bed to adjust her celing fan last night.  She hurt popping and it has hurt since.    [name] is a 16 old female that is presenting with right lateral shoulder pain. The pain is significant in nature. She has received 2 different injections. These are not had any improvement in her symptoms. She has had no inciting event. Denies any previous injury or surgery. The pain radiates down the lateral aspect of her shoulder to her elbow. Has been placed on prednisone and meloxicam. Denies any fevers or chills. Has pain with flexing or abducting her arm. Has pain in 19 concerned positions. Pain is sharp in nature.   Patient was seen on 02/06/17 for gout of the right shoulder. She was provided allopurinol and mobic during this encounter.  Review of her kidney function social creatinine of 1.2 and a GFR 46 on her lab work on 12/28/16. Uric acid was elevated 8.8 on that date as well. She received a subacromial injection and a biceps tendon injection on 12/28/16.    Review of Systems  Constitutional: Negative for fever.  Musculoskeletal: Positive for myalgias. Negative for gait problem.  Skin: Negative for color change.  Neurological: Negative for weakness and numbness.  Hematological: Negative for adenopathy.    HISTORY: Past Medical, Surgical, Social, and Family History Reviewed & Updated per EMR.   Pertinent Historical Findings include:  Past Medical History:  Diagnosis Date  . Anxiety state, unspecified   . Bronchitis, acute   . Carotid bruit   . Coronary atherosclerosis of unspecified type of vessel, native or graft    multiple angioplasy  procedures in 90's/early 2000's  . De Quervain's tenosynovitis   . Depressive disorder, not elsewhere classified   . Female bladder prolapse   . Lichen 2011   Vaginal  . Other and unspecified hyperlipidemia   . Unspecified essential hypertension   . Unspecified hypothyroidism     No past surgical history on file.  Allergies  Allergen Reactions  . Epinephrine Other (See Comments)    unknown  . Isosorbide Mononitrate Other (See Comments)    Severe headache  . Lidocaine Nausea And Vomiting  . Morphine Other (See Comments)    unknown  . Propoxyphene N-Acetaminophen Other (See Comments)    unknown  . Alprazolam Other (See Comments)    REACTION: very sedating  . Atorvastatin Other (See Comments)    REACTION: weakness  . Codeine Nausea And Vomiting  . Ezetimibe Other (See Comments)    REACTION: myalgia  . Prilosec [Omeprazole] Diarrhea and Other (See Comments)    Chest discomfort  . Rosuvastatin Other (See Comments)    REACTION: leg weakness  . Simvastatin Other (See Comments)    REACTION: leg  weakness  . Meloxicam     diarrhea  . Tetracycline Nausea And Vomiting  . Amoxicillin Nausea And Vomiting  . Ciprofloxacin Nausea And Vomiting  . Diphenhydramine Hcl Anxiety    Family History  Problem Relation Age of Onset  . Hypertension Mother   . Hypertension Father   . Hypertension Other   .  Coronary artery disease Other      Social History   Social History  . Marital status: Widowed    Spouse name: N/A  . Number of children: N/A  . Years of education: N/A   Occupational History  . Not on file.   Social History Main Topics  . Smoking status: Former Smoker    Quit date: 07/22/1991  . Smokeless tobacco: Never Used  . Alcohol use No  . Drug use: No  . Sexual activity: Not Currently   Other Topics Concern  . 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     PHYSICAL EXAM:  VS: BP (!)  158/78 (BP Location: Left Arm, Patient Position: Sitting, Cuff Size: Normal)   Pulse 70   Temp 97.9 F (36.6 C) (Oral)   Ht  (1.727 m)   Wt 166 lb 1.9 oz (75.4 kg)   SpO2 97%   BMI 25.26 kg/m  Physical Exam Gen: NAD, alert, cooperative with exam, well-appearing ENT: normal lips, normal nasal mucosa,  Eye: normal EOM, normal conjunctiva and lids CV:  no edema, +2 pedal pulses   Resp: no accessory muscle use, non-labored,  GI: no masses or tenderness, no hernia  Skin: no rashes, no areas of induration  Neuro: normal tone, normal sensation to touch Psych:  normal insight, alert and oriented MSK:  Right shoulder: Normal active flexion and abduction. Normal strength to resistance with internal and external rotation. Normal external rotation. Normal can't testing. Sublingual tolerance testing. Some pain with some beats testing. Some tenderness to palpation over the acromioclavicular joint. Neurovascularly intact.  Limited ultrasound: Right shoulder:  Biceps tendon is normal short and long axis. There is a fluid collection that does not demonstrate to be encompassing the biceps tendon but is occurring on the anterior shoulder Normal subscapularis. Supraspinatus is normal but also has a fluid collection superiorly to the tendon. Fluid collection was also observed to be posteriorly and seems to be communicating with the joint.   Summary: Effusion of the right shoulder  Ultrasound and interpretation by Clare Gandy, MD                 Aspiration/Injection Procedure Note Judge Stall [birth year]  Procedure: Aspiration and Injection Indications: Right shoulder pain  Procedure Details Consent: Risks of procedure as well as the alternatives and risks of each were explained to the (patient/caregiver).  Consent for procedure obtained. Time Out: Verified patient identification, verified procedure, site/side was marked, verified correct patient position, special  equipment/implants available, medications/allergies/relevent history reviewed, required imaging and test results available.  Performed.  The area was cleaned with iodine and alcohol swabs.    The right glenohumeral joint was anesthetized with 3 mL of 1% lidocaine on a 25-gauge inch and a half needle. An 18-gauge needle and 60 mL syringe was inserted into the fluid collection. This was aspirated and then an injection of 1 mL of 40 mg Depomedrol and 4 cc's of 0.5% Marcaine.  Ultrasound was used. I    Amount of Fluid Aspirated: 10mL Character of Fluid: clear, red colored and gelatinous Fluid was sent EAV:WUJW count, crystal identification, protein and glucose A sterile dressing was applied.  Patient did tolerate procedure well.     ASSESSMENT & PLAN:   Shoulder effusion, right She seems to have fluid in different areas of the shoulder. Unclear if all these are extending from the  joint itself. Could be associated with an underlying arthritic change versus gout versus reactive. - Aspiration and injection today. - X-rays - Sent for cytology - If no improvement may need to consider formal physical therapy.

## 2017-02-23 LAB — TIQ-NTM

## 2017-02-23 NOTE — Assessment & Plan Note (Signed)
She seems to have fluid in different areas of the shoulder. Unclear if all these are extending from the joint itself. Could be associated with an underlying arthritic change versus gout versus reactive. - Aspiration and injection today. - X-rays - Sent for cytology - If no improvement may need to consider formal physical therapy.

## 2017-02-25 ENCOUNTER — Encounter: Payer: Self-pay | Admitting: Internal Medicine

## 2017-02-25 ENCOUNTER — Encounter: Payer: Self-pay | Admitting: Family Medicine

## 2017-02-25 LAB — SYNOVIAL CELL COUNT + DIFF, W/ CRYSTALS
Basophils, %: 0 %
Eosinophils-Synovial: 0 % (ref 0–2)
Lymphocytes-Synovial Fld: 17 % (ref 0–74)
Monocyte/Macrophage: 71 % — ABNORMAL HIGH (ref 0–69)
Neutrophil, Synovial: 12 % (ref 0–24)
Synoviocytes, %: 0 % (ref 0–15)
WBC, Synovial: 416 cells/uL — ABNORMAL HIGH (ref <150)

## 2017-02-27 ENCOUNTER — Telehealth: Payer: Self-pay | Admitting: Family Medicine

## 2017-02-27 NOTE — Telephone Encounter (Signed)
Spoke with patient about her questions in regards to her shoulder.   Myra Rude, MD [facility] 02/27/2017, 12:51 PM

## 2017-03-04 ENCOUNTER — Other Ambulatory Visit: Payer: Self-pay | Admitting: Internal Medicine

## 2017-03-12 ENCOUNTER — Other Ambulatory Visit (INDEPENDENT_AMBULATORY_CARE_PROVIDER_SITE_OTHER): Payer: Medicare Other

## 2017-03-12 ENCOUNTER — Ambulatory Visit (INDEPENDENT_AMBULATORY_CARE_PROVIDER_SITE_OTHER): Payer: Medicare Other | Admitting: Internal Medicine

## 2017-03-12 ENCOUNTER — Encounter: Payer: Self-pay | Admitting: Internal Medicine

## 2017-03-12 DIAGNOSIS — M10011 Idiopathic gout, right shoulder: Secondary | ICD-10-CM

## 2017-03-12 DIAGNOSIS — I10 Essential (primary) hypertension: Secondary | ICD-10-CM

## 2017-03-12 DIAGNOSIS — N951 Menopausal and female climacteric states: Secondary | ICD-10-CM

## 2017-03-12 DIAGNOSIS — M7662 Achilles tendinitis, left leg: Secondary | ICD-10-CM

## 2017-03-12 DIAGNOSIS — M7660 Achilles tendinitis, unspecified leg: Secondary | ICD-10-CM | POA: Insufficient documentation

## 2017-03-12 LAB — BASIC METABOLIC PANEL
BUN: 20 mg/dL (ref 6–23)
CO2: 30 mEq/L (ref 19–32)
Calcium: 9 mg/dL (ref 8.4–10.5)
Chloride: 105 mEq/L (ref 96–112)
Creatinine, Ser: 1.13 mg/dL (ref 0.40–1.20)
GFR: 50.09 mL/min — ABNORMAL LOW (ref >60.00)
Glucose, Bld: 93 mg/dL (ref 70–99)
Potassium: 4.3 mEq/L (ref 3.5–5.1)
Sodium: 140 mEq/L (ref 135–145)

## 2017-03-12 LAB — CBC WITH DIFFERENTIAL/PLATELET
Basophils Absolute: 0 10*3/uL (ref 0.0–0.1)
Basophils Relative: 0.8 % (ref 0.0–3.0)
Eosinophils Absolute: 0.2 10*3/uL (ref 0.0–0.7)
Eosinophils Relative: 3.2 % (ref 0.0–5.0)
HCT: 36.9 % (ref 36.0–46.0)
Hemoglobin: 12.2 g/dL (ref 12.0–15.0)
Lymphocytes Relative: 26.1 % (ref 12.0–46.0)
Lymphs Abs: 1.7 10*3/uL (ref 0.7–4.0)
MCHC: 33 g/dL (ref 30.0–36.0)
MCV: 97.3 fl (ref 78.0–100.0)
Monocytes Absolute: 0.4 10*3/uL (ref 0.1–1.0)
Monocytes Relative: 6.2 % (ref 3.0–12.0)
Neutro Abs: 4.1 10*3/uL (ref 1.4–7.7)
Neutrophils Relative %: 63.7 % (ref 43.0–77.0)
Platelets: 248 10*3/uL (ref 150.0–400.0)
RBC: 3.79 Mil/uL — ABNORMAL LOW (ref 3.87–5.11)
RDW: 13.1 % (ref 11.5–15.5)
WBC: 6.4 10*3/uL (ref 4.0–10.5)

## 2017-03-12 LAB — URIC ACID: Uric Acid, Serum: 7.3 mg/dL — ABNORMAL HIGH (ref 2.4–7.0)

## 2017-03-12 MED ORDER — ALLOPURINOL 300 MG PO TABS: 300.0000 mg | ORAL_TABLET | Freq: Every day | ORAL | 11 refills | Status: DC

## 2017-03-12 MED ORDER — ESTRADIOL CYPIONATE 5 MG/ML IM OIL: TOPICAL_OIL | INTRAMUSCULAR | 3 refills | Status: DC

## 2017-03-12 MED ORDER — NITROGLYCERIN 0.4 MG/SPRAY TL SOLN: 1 refills | Status: DC

## 2017-03-12 NOTE — Assessment & Plan Note (Signed)
Toprol, Procardia 

## 2017-03-12 NOTE — Assessment & Plan Note (Signed)
Toprol, Nifedipine 

## 2017-03-12 NOTE — Patient Instructions (Signed)
1/4" heel lift    Achilles Tendinitis Achilles tendinitis is inflammation of the tough, cord-like band that attaches the lower leg muscles to the heel bone (Achilles tendon). This is usually caused by overusing the tendon and the ankle joint. Achilles tendinitis usually gets better over time with treatment and caring for yourself at home. It can take weeks or months to heal completely. What are the causes? This condition may be caused by:  A sudden increase in exercise or activity, such as running.  Doing the same exercises or activities (such as jumping) over and over.  Not warming up calf muscles before exercising.  Exercising in shoes that are worn out or not made for exercise.  Having arthritis or a bone growth (spur) on the back of the heel bone. This can rub against the tendon and hurt it.  Age-related wear and tear. Tendons become less flexible with age and more likely to be injured.  What are the signs or symptoms? Common symptoms of this condition include:  Pain in the Achilles tendon or in the back of the leg, just above the heel. The pain usually gets worse with exercise.  Stiffness or soreness in the back of the leg, especially in the morning.  Swelling of the skin over the Achilles tendon.  Thickening of the tendon.  Bone spurs at the bottom of the Achilles tendon, near the heel.  Trouble standing on tiptoe.  How is this diagnosed? This condition is diagnosed based on your symptoms and a physical exam. You may have tests, including:  X-rays.  MRI.  How is this treated? The goal of treatment is to relieve symptoms and help your injury heal. Treatment may include:  Decreasing or stopping activities that caused the tendinitis. This may mean switching to low-impact exercises like biking or swimming.  Icing the injured area.  Doing physical therapy, including strengthening and stretching exercises.  NSAIDs to help relieve pain and swelling.  Using  supportive shoes, wraps, heel lifts, or a walking boot (air cast).  Surgery. This may be done if your symptoms do not improve after 6 months.  Using high-energy shock wave impulses to stimulate the healing process (extracorporeal shock wave therapy). This is rare.  Injection of medicines to help relieve inflammation (corticosteroids). This is rare.  Follow these instructions at home: If you have an air cast:  Wear the cast as told by your health care provider. Remove it only as told by your health care provider.  Loosen the cast if your toes tingle, become numb, or turn cold and blue. Activity  Gradually return to your normal activities once your health care provider approves. Do not do activities that cause pain. ? Consider doing low-impact exercises, like cycling or swimming.  If you have an air cast, ask your health care provider when it is safe for you to drive.  If physical therapy was prescribed, do exercises as told by your health care provider or physical therapist. Managing pain, stiffness, and swelling  Raise (elevate) your foot above the level of your heart while you are sitting or lying down.  Move your toes often to avoid stiffness and to lessen swelling.  If directed, put ice on the injured area: ? Put ice in a plastic bag. ? Place a towel between your skin and the bag. ? Leave the ice on for 20 minutes, 2-3 times a day General instructions  If directed, wrap your foot with an elastic bandage or other wrap. This can help keep  your tendon from moving too much while it heals. Your health care provider will show you how to wrap your foot correctly.  Wear supportive shoes or heel lifts only as told by your health care provider.  Take over-the-counter and prescription medicines only as told by your health care provider.  Keep all follow-up visits as told by your health care provider. This is important. Contact a health care provider if:  You have symptoms that gets  worse.  You have pain that does not get better with medicine.  You develop new, unexplained symptoms.  You develop warmth and swelling in your foot.  You have a fever. Get help right away if:  You have a sudden popping sound or sensation in your Achilles tendon followed by severe pain.  You cannot move your toes or foot.  You cannot put any weight on your foot. Summary  Achilles tendinitis is inflammation of the tough, cord-like band that attaches the lower leg muscles to the heel bone (Achilles tendon).  This condition is usually caused by overusing the tendon and the ankle joint. It can also be caused by arthritis or normal aging.  The most common symptoms of this condition include pain, swelling, or stiffness in the Achilles tendon or in the back of the leg.  This condition is usually treated with rest, NSAIDs, and physical therapy. This information is not intended to replace advice given to you by your health care provider. Make sure you discuss any questions you have with your health care provider. Document Released: 02/14/2005 Document Revised: 03/26/2016 Document Reviewed: 03/26/2016 Elsevier Interactive Patient Education  2017 [employer]

## 2017-03-12 NOTE — Assessment & Plan Note (Signed)
1/4" heel lift

## 2017-03-12 NOTE — Assessment & Plan Note (Signed)
Labs

## 2017-03-12 NOTE — Assessment & Plan Note (Signed)
Depo-Estra 0.3 ml

## 2017-03-12 NOTE — Progress Notes (Signed)
Subjective:  Patient ID: [name], female    DOB: 05/27/1945  Age: 71 y.o. MRN: [id number]  CC: No chief complaint on file.   HPI [name] StallRuby C Sutliff presents for R shoulder pain - better C/o R foot pain - she missed a step and pullled her L Achilles tendon weeks ago F/u estrogen deficiency F/u gout  Outpatient Medications Prior to Visit  Medication Sig Dispense Refill  . allopurinol (ZYLOPRIM) 100 MG tablet Take 1 tablet (100 mg total) by mouth daily. 30 tablet 6  . aspirin 81 MG EC tablet Take 81 mg by mouth daily.      . Cholecalciferol 1000 UNITS tablet Take 1,000 Units by mouth daily.      . clorazepate (TRANXENE) 15 MG tablet Take 1 tablet (15 mg total) by mouth 3 (three) times daily as needed. for anxiety 270 tablet 1  . doxepin (SINEQUAN) 50 MG capsule Take 3 capsules (150 mg total) by mouth at bedtime. 270 capsule 3  . estradiol cypionate (DEPO-ESTRADIOL) 5 MG/ML injection Use 0.3 ml every 18-25 days IM 5 mL 3  . furosemide (LASIX) 40 MG tablet Take 1 tablet (40 mg total) by mouth 2 (two) times daily. 180 tablet 3  . levothyroxine (SYNTHROID, LEVOTHROID) 125 MCG tablet Take 1 tablet (125 mcg total) by mouth daily. 90 tablet 3  . LORazepam (ATIVAN) 0.5 MG tablet Take 1 tablet (0.5 mg total) by mouth at bedtime. 90 tablet 1  . methylPREDNISolone (MEDROL DOSEPAK) 4 MG TBPK tablet As directed 21 tablet 0  . metoprolol (LOPRESSOR) 50 MG tablet Take 1 tablet (50 mg total) by mouth 3 (three) times daily. 270 tablet 3  . mupirocin ointment (BACTROBAN) 2 % Apply 1 application topically daily.    Marland Kitchen. NIFEdipine (PROCARDIA-XL/ADALAT-CC/NIFEDICAL-XL) 30 MG 24 hr tablet Take 1 tablet (30 mg total) by mouth daily. 90 tablet 3  . nitroGLYCERIN (NITROLINGUAL) 0.4 MG/SPRAY spray PLACE 1 SPRAY UNDER THE TONGUE EVERY 5 MINUTES AS NEEDED FOR CHEST PAIN. MAY REPEAT 3 TIMES 12 g 0  . potassium chloride SA (KLOR-CON M20) 20 MEQ tablet Take 1 tablet (20 mEq total) by mouth 2 (two) times daily. 180 tablet 3     Facility-Administered Medications Prior to Visit  Medication Dose Route Frequency Provider Last Rate Last Dose  . estradiol cypionate (DEPO-ESTRADIOL) 5 MG/ML injection 1 mg  1 mg Intramuscular Q30 days Plotnikov, Georgina QuintAleksei V, MD   1 mg at 02/06/17 1700    ROS Review of Systems  Constitutional: Negative for activity change, appetite change, chills, fatigue and unexpected weight change.  HENT: Negative for congestion, mouth sores and sinus pressure.   Eyes: Negative for visual disturbance.  Respiratory: Negative for cough and chest tightness.   Gastrointestinal: Negative for abdominal pain and nausea.  Genitourinary: Negative for difficulty urinating, frequency and vaginal pain.  Musculoskeletal: Positive for arthralgias and gait problem. Negative for back pain.  Skin: Negative for pallor and rash.  Neurological: Negative for dizziness, tremors, weakness, numbness and headaches.  Psychiatric/Behavioral: Negative for confusion and sleep disturbance.    Objective:  BP 138/88   Pulse 64   Temp 97.7 F (36.5 C) (Oral)   Ht 5\' 8"  (1.727 m)   Wt 169 lb (76.7 kg)   SpO2 99%   BMI 25.70 kg/m   BP Readings from Last 3 Encounters:  03/12/17 138/88  02/22/17 (!) 158/78  02/06/17 (!) 170/82    Wt Readings from Last 3 Encounters:  03/12/17 169 lb (76.7 kg)  02/22/17 166 lb 1.9 oz (75.4 kg)  02/06/17 164 lb (74.4 kg)    Physical Exam  Constitutional: She appears well-developed. No distress.  HENT:  Head: Normocephalic.  Right Ear: External ear normal.  Left Ear: External ear normal.  Nose: Nose normal.  Mouth/Throat: Oropharynx is clear and moist.  Eyes: Pupils are equal, round, and reactive to light. Conjunctivae are normal. Right eye exhibits no discharge. Left eye exhibits no discharge.  Neck: Normal range of motion. Neck supple. No JVD present. No tracheal deviation present. No thyromegaly present.  Cardiovascular: Normal rate, regular rhythm and normal heart sounds.    Pulmonary/Chest: No stridor. No respiratory distress. She has no wheezes.  Abdominal: Soft. Bowel sounds are normal. She exhibits no distension and no mass. There is no tenderness. There is no rebound and no guarding.  Musculoskeletal: She exhibits edema and tenderness.  Lymphadenopathy:    She has no cervical adenopathy.  Neurological: She displays normal reflexes. No cranial nerve deficit. She exhibits normal muscle tone. Coordination normal.  Skin: No rash noted. No erythema.  Psychiatric: She has a normal mood and affect. Her behavior is normal. Judgment and thought content normal.  L achilles tendon is tender R shoulder is w/less pain Small bruises B forearms  Lab Results  Component Value Date   WBC 7.5 11/02/2016   HGB 12.4 11/02/2016   HCT 37.1 11/02/2016   PLT 259.0 11/02/2016   GLUCOSE 91 12/28/2016   CHOL 236 (H) 11/02/2016   TRIG 100.0 11/02/2016   HDL 60.60 11/02/2016   LDLDIRECT 188.2 01/10/2012   LDLCALC 155 (H) 11/02/2016   ALT 8 11/02/2016   AST 12 11/02/2016   NA 139 12/28/2016   K 4.0 12/28/2016   CL 103 12/28/2016   CREATININE 1.20 12/28/2016   BUN 16 12/28/2016   CO2 28 12/28/2016   TSH 2.14 11/02/2016   INR 1.09 08/22/2009    Dg Shoulder Right  Result Date: 02/23/2017 CLINICAL DATA:  Shoulder pain for 2 months.  No known injury. EXAM: RIGHT SHOULDER - 2+ VIEW COMPARISON:  None. FINDINGS: There is no evidence of fracture or dislocation. There is generalized osteopenia. There is no evidence of arthropathy or other focal bone abnormality. Soft tissues are unremarkable. IMPRESSION: No acute osseous injury of the right shoulder. Electronically Signed   By: Elige Ko   On: 02/23/2017 09:35    Assessment & Plan:   There are no diagnoses linked to this encounter. I am having Ms. Denno maintain her aspirin, Cholecalciferol, NIFEdipine, doxepin, metoprolol tartrate, furosemide, levothyroxine, potassium chloride SA, mupirocin ointment, nitroGLYCERIN,  methylPREDNISolone, allopurinol, clorazepate, LORazepam, and estradiol cypionate. We will continue to administer estradiol cypionate.  No orders of the defined types were placed in this encounter.    Follow-up: No Follow-up on file.  Sonda Primes, MD

## 2017-03-14 ENCOUNTER — Encounter: Payer: Self-pay | Admitting: Internal Medicine

## 2017-03-20 ENCOUNTER — Encounter: Payer: Self-pay | Admitting: Internal Medicine

## 2017-03-25 ENCOUNTER — Other Ambulatory Visit: Payer: Self-pay | Admitting: Internal Medicine

## 2017-04-08 ENCOUNTER — Ambulatory Visit (INDEPENDENT_AMBULATORY_CARE_PROVIDER_SITE_OTHER): Payer: Medicare Other | Admitting: Internal Medicine

## 2017-04-08 ENCOUNTER — Encounter: Payer: Self-pay | Admitting: Internal Medicine

## 2017-04-08 VITALS — BP 150/88 | HR 65 | Temp 97.8°F | Ht 68.0 in | Wt 169.0 lb

## 2017-04-08 DIAGNOSIS — M545 Low back pain, unspecified: Secondary | ICD-10-CM

## 2017-04-08 DIAGNOSIS — N951 Menopausal and female climacteric states: Secondary | ICD-10-CM

## 2017-04-08 DIAGNOSIS — M7752 Other enthesopathy of left foot: Secondary | ICD-10-CM | POA: Diagnosis not present

## 2017-04-08 DIAGNOSIS — F419 Anxiety disorder, unspecified: Secondary | ICD-10-CM | POA: Diagnosis not present

## 2017-04-08 DIAGNOSIS — G8929 Other chronic pain: Secondary | ICD-10-CM

## 2017-04-08 DIAGNOSIS — M7662 Achilles tendinitis, left leg: Secondary | ICD-10-CM | POA: Diagnosis not present

## 2017-04-08 DIAGNOSIS — I10 Essential (primary) hypertension: Secondary | ICD-10-CM

## 2017-04-08 DIAGNOSIS — M25411 Effusion, right shoulder: Secondary | ICD-10-CM | POA: Diagnosis not present

## 2017-04-08 MED ORDER — ESTRADIOL CYPIONATE 5 MG/ML IM OIL
0.3000 mg | TOPICAL_OIL | INTRAMUSCULAR | Status: DC
  Administered 2017-04-08 – 2017-06-28 (×2): 0.5 mg via INTRAMUSCULAR

## 2017-04-08 NOTE — Assessment & Plan Note (Signed)
Tranxene prn , Lorazepam prn, Doxepine at hs Pt taking meds at night (not when driving)  Potential benefits of a long term benzodiazepines  use as well as potential risks  and complications were explained to the patient and were aknowledged.

## 2017-04-08 NOTE — Progress Notes (Signed)
Subjective:  Patient ID: [name], female    DOB: 09/09/1945  Age: 71 y.o. MRN: [id number]  CC: No chief complaint on file.   HPI [name] StallRuby C Searfoss presents for gout and bruising - better off gout meds. L shoulder is better now. Needs a shot. L achilles pain is better...  Outpatient Medications Prior to Visit  Medication Sig Dispense Refill  . aspirin 81 MG EC tablet Take 81 mg by mouth daily.      . Cholecalciferol 1000 UNITS tablet Take 1,000 Units by mouth daily.      . clorazepate (TRANXENE) 15 MG tablet Take 1 tablet (15 mg total) by mouth 3 (three) times daily as needed. for anxiety 270 tablet 1  . doxepin (SINEQUAN) 50 MG capsule Take 3 capsules (150 mg total) by mouth at bedtime. 270 capsule 3  . estradiol cypionate (DEPO-ESTRADIOL) 5 MG/ML injection Use 0.3 ml every 18-25 days IM 5 mL 3  . furosemide (LASIX) 40 MG tablet Take 1 tablet (40 mg total) by mouth 2 (two) times daily. 180 tablet 3  . levothyroxine (SYNTHROID, LEVOTHROID) 125 MCG tablet Take 1 tablet (125 mcg total) by mouth daily. 90 tablet 3  . LORazepam (ATIVAN) 0.5 MG tablet Take 1 tablet (0.5 mg total) by mouth at bedtime. 90 tablet 1  . methylPREDNISolone (MEDROL DOSEPAK) 4 MG TBPK tablet As directed 21 tablet 0  . metoprolol (LOPRESSOR) 50 MG tablet Take 1 tablet (50 mg total) by mouth 3 (three) times daily. 270 tablet 3  . mupirocin ointment (BACTROBAN) 2 % Apply 1 application topically daily.    Marland Kitchen. NIFEdipine (PROCARDIA-XL/ADALAT-CC/NIFEDICAL-XL) 30 MG 24 hr tablet Take 1 tablet (30 mg total) by mouth daily. 90 tablet 3  . nitroGLYCERIN (NITROLINGUAL) 0.4 MG/SPRAY spray PLACE 1 SPRAY UNDER THE TONGUE EVERY 5 MINUTES AS NEEDED FOR CHEST PAIN. MAY REPEAT 3 TIMES 12 g 1  . potassium chloride SA (KLOR-CON M20) 20 MEQ tablet Take 1 tablet (20 mEq total) by mouth 2 (two) times daily. 180 tablet 3   Facility-Administered Medications Prior to Visit  Medication Dose Route Frequency Provider Last Rate Last Dose  .  estradiol cypionate (DEPO-ESTRADIOL) 5 MG/ML injection 1 mg  1 mg Intramuscular Q30 days Plotnikov, Georgina QuintAleksei V, MD   1 mg at 03/12/17 1150    ROS Review of Systems  Constitutional: Negative for activity change, appetite change, chills, fatigue and unexpected weight change.  HENT: Negative for congestion, mouth sores and sinus pressure.   Eyes: Negative for visual disturbance.  Respiratory: Negative for cough and chest tightness.   Gastrointestinal: Negative for abdominal pain and nausea.  Genitourinary: Negative for difficulty urinating, frequency and vaginal pain.  Musculoskeletal: Positive for arthralgias. Negative for back pain and gait problem.  Skin: Negative for pallor and rash.  Neurological: Negative for dizziness, tremors, weakness, numbness and headaches.  Psychiatric/Behavioral: Negative for confusion and sleep disturbance.    Objective:  BP (!) 150/88 (BP Location: Left Arm, Patient Position: Sitting, Cuff Size: Normal)   Pulse 65   Temp 97.8 F (36.6 C) (Oral)   Ht 5\' 8"  (1.727 m)   Wt 169 lb (76.7 kg)   SpO2 98%   BMI 25.70 kg/m   BP Readings from Last 3 Encounters:  04/08/17 (!) 150/88  03/12/17 138/88  02/22/17 (!) 158/78    Wt Readings from Last 3 Encounters:  04/08/17 169 lb (76.7 kg)  03/12/17 169 lb (76.7 kg)  02/22/17 166 lb 1.9 oz (75.4 kg)  Physical Exam  Constitutional: She appears well-developed. No distress.  HENT:  Head: Normocephalic.  Right Ear: External ear normal.  Left Ear: External ear normal.  Nose: Nose normal.  Mouth/Throat: Oropharynx is clear and moist.  Eyes: Conjunctivae are normal. Pupils are equal, round, and reactive to light. Right eye exhibits no discharge. Left eye exhibits no discharge.  Neck: Normal range of motion. Neck supple. No JVD present. No tracheal deviation present. No thyromegaly present.  Cardiovascular: Normal rate, regular rhythm and normal heart sounds.  Pulmonary/Chest: No stridor. No respiratory  distress. She has no wheezes.  Abdominal: Soft. Bowel sounds are normal. She exhibits no distension and no mass. There is no tenderness. There is no rebound and no guarding.  Musculoskeletal: She exhibits tenderness. She exhibits no edema.  Lymphadenopathy:    She has no cervical adenopathy.  Neurological: She displays normal reflexes. No cranial nerve deficit. She exhibits normal muscle tone. Coordination normal.  Skin: No rash noted. No erythema.  Psychiatric: She has a normal mood and affect. Her behavior is normal. Judgment and thought content normal.    Lab Results  Component Value Date   WBC 6.4 03/12/2017   HGB 12.2 03/12/2017   HCT 36.9 03/12/2017   PLT 248.0 03/12/2017   GLUCOSE 93 03/12/2017   CHOL 236 (H) 11/02/2016   TRIG 100.0 11/02/2016   HDL 60.60 11/02/2016   LDLDIRECT 188.2 01/10/2012   LDLCALC 155 (H) 11/02/2016   ALT 8 11/02/2016   AST 12 11/02/2016   NA 140 03/12/2017   K 4.3 03/12/2017   CL 105 03/12/2017   CREATININE 1.13 03/12/2017   BUN 20 03/12/2017   CO2 30 03/12/2017   TSH 2.14 11/02/2016   INR 1.09 08/22/2009    Dg Shoulder Right  Result Date: 02/23/2017 CLINICAL DATA:  Shoulder pain for 2 months.  No known injury. EXAM: RIGHT SHOULDER - 2+ VIEW COMPARISON:  None. FINDINGS: There is no evidence of fracture or dislocation. There is generalized osteopenia. There is no evidence of arthropathy or other focal bone abnormality. Soft tissues are unremarkable. IMPRESSION: No acute osseous injury of the right shoulder. Electronically Signed   By: Elige KoHetal  Patel   On: 02/23/2017 09:35    Assessment & Plan:   There are no diagnoses linked to this encounter. I am having Anae C. Dorothyann GibbsFinch maintain her aspirin, Cholecalciferol, NIFEdipine, doxepin, metoprolol tartrate, furosemide, levothyroxine, potassium chloride SA, mupirocin ointment, methylPREDNISolone, clorazepate, LORazepam, estradiol cypionate, and nitroGLYCERIN. We will continue to administer estradiol  cypionate.  No orders of the defined types were placed in this encounter.    Follow-up: No Follow-up on file.  Sonda PrimesAlex Plotnikov, MD

## 2017-04-08 NOTE — Assessment & Plan Note (Signed)
Dr Schmitz Better 

## 2017-04-08 NOTE — Assessment & Plan Note (Signed)
Better  

## 2017-04-08 NOTE — Assessment & Plan Note (Signed)
Toprol, Nifedipine 

## 2017-04-08 NOTE — Assessment & Plan Note (Addendum)
Dr Schmitz Better 

## 2017-04-08 NOTE — Assessment & Plan Note (Signed)
Dr Jordan LikesSchmitz Better

## 2017-05-01 ENCOUNTER — Ambulatory Visit (INDEPENDENT_AMBULATORY_CARE_PROVIDER_SITE_OTHER): Payer: Medicare Other | Admitting: Internal Medicine

## 2017-05-01 ENCOUNTER — Encounter: Payer: Self-pay | Admitting: Internal Medicine

## 2017-05-01 ENCOUNTER — Telehealth: Payer: Self-pay | Admitting: Internal Medicine

## 2017-05-01 VITALS — BP 152/84 | HR 77 | Temp 97.7°F | Ht 68.0 in | Wt 168.0 lb

## 2017-05-01 DIAGNOSIS — E034 Atrophy of thyroid (acquired): Secondary | ICD-10-CM | POA: Diagnosis not present

## 2017-05-01 DIAGNOSIS — N951 Menopausal and female climacteric states: Secondary | ICD-10-CM | POA: Diagnosis not present

## 2017-05-01 DIAGNOSIS — I10 Essential (primary) hypertension: Secondary | ICD-10-CM

## 2017-05-01 DIAGNOSIS — J069 Acute upper respiratory infection, unspecified: Secondary | ICD-10-CM

## 2017-05-01 MED ORDER — ESTRADIOL CYPIONATE 5 MG/ML IM OIL
1.5000 mg | TOPICAL_OIL | Freq: Once | INTRAMUSCULAR | Status: AC
  Administered 2017-05-01: 1.5 mg via INTRAMUSCULAR

## 2017-05-01 MED ORDER — AZITHROMYCIN 250 MG PO TABS: ORAL_TABLET | ORAL | 0 refills | Status: DC

## 2017-05-01 MED ORDER — CEPHALEXIN 500 MG PO CAPS: 500.0000 mg | ORAL_CAPSULE | Freq: Four times a day (QID) | ORAL | 0 refills | Status: DC

## 2017-05-01 NOTE — Assessment & Plan Note (Signed)
Zpac 

## 2017-05-01 NOTE — Patient Instructions (Signed)
You can use over-the-counter  "cold" medicines  such as  "Afrin" nasal spray for nasal congestion as directed. Use " Delsym" or" Robitussin" cough syrup varietis for cough.  You can use plain "Tylenol" or "Advil" for fever, chills and achyness. Use Halls or Ricola cough drops.     Please, make an appointment if you are not better or if you're worse.  

## 2017-05-01 NOTE — Telephone Encounter (Signed)
Keflex sent

## 2017-05-01 NOTE — Progress Notes (Signed)
Subjective:  Patient ID: [name], female    DOB: 12/19/1945  Age: 71 y.o. MRN: [id number]  CC: No chief complaint on file.   HPI [name] StallRuby C [name] presents for a URI sx's x5 d. C/o green mucus. F/u HTN  Outpatient Medications Prior to Visit  Medication Sig Dispense Refill  . aspirin 81 MG EC tablet Take 81 mg by mouth daily.      . Cholecalciferol 1000 UNITS tablet Take 1,000 Units by mouth daily.      . clorazepate (TRANXENE) 15 MG tablet Take 1 tablet (15 mg total) by mouth 3 (three) times daily as needed. for anxiety 270 tablet 1  . doxepin (SINEQUAN) 50 MG capsule Take 3 capsules (150 mg total) by mouth at bedtime. 270 capsule 3  . estradiol cypionate (DEPO-ESTRADIOL) 5 MG/ML injection Use 0.3 ml every 18-25 days IM 5 mL 3  . furosemide (LASIX) 40 MG tablet Take 1 tablet (40 mg total) by mouth 2 (two) times daily. 180 tablet 3  . levothyroxine (SYNTHROID, LEVOTHROID) 125 MCG tablet Take 1 tablet (125 mcg total) by mouth daily. 90 tablet 3  . LORazepam (ATIVAN) 0.5 MG tablet Take 1 tablet (0.5 mg total) by mouth at bedtime. 90 tablet 1  . metoprolol (LOPRESSOR) 50 MG tablet Take 1 tablet (50 mg total) by mouth 3 (three) times daily. 270 tablet 3  . mupirocin ointment (BACTROBAN) 2 % Apply 1 application topically daily.    Marland Kitchen. NIFEdipine (PROCARDIA-XL/ADALAT-CC/NIFEDICAL-XL) 30 MG 24 hr tablet Take 1 tablet (30 mg total) by mouth daily. 90 tablet 3  . nitroGLYCERIN (NITROLINGUAL) 0.4 MG/SPRAY spray PLACE 1 SPRAY UNDER THE TONGUE EVERY 5 MINUTES AS NEEDED FOR CHEST PAIN. MAY REPEAT 3 TIMES 12 g 1  . potassium chloride SA (KLOR-CON M20) 20 MEQ tablet Take 1 tablet (20 mEq total) by mouth 2 (two) times daily. 180 tablet 3  . methylPREDNISolone (MEDROL DOSEPAK) 4 MG TBPK tablet As directed 21 tablet 0   Facility-Administered Medications Prior to Visit  Medication Dose Route Frequency Provider Last Rate Last Dose  . estradiol cypionate (DEPO-ESTRADIOL) 5 MG/ML injection 0.5 mg  0.5 mg  Intramuscular Q30 days Plotnikov, Georgina QuintAleksei V, MD   0.5 mg at 04/08/17 1534    ROS Review of Systems  Constitutional: Positive for fatigue. Negative for activity change, appetite change, chills and unexpected weight change.  HENT: Positive for congestion, postnasal drip, rhinorrhea, sinus pain and sore throat. Negative for mouth sores and sinus pressure.   Eyes: Negative for visual disturbance.  Respiratory: Positive for cough. Negative for chest tightness.   Gastrointestinal: Negative for abdominal pain and nausea.  Genitourinary: Negative for difficulty urinating, frequency and vaginal pain.  Musculoskeletal: Negative for back pain and gait problem.  Skin: Negative for pallor and rash.  Neurological: Negative for dizziness, tremors, weakness, numbness and headaches.  Psychiatric/Behavioral: Negative for confusion and sleep disturbance.    Objective:  BP (!) 152/84 (BP Location: Left Arm, Patient Position: Sitting, Cuff Size: Normal)   Pulse 77   Temp 97.7 F (36.5 C) (Oral)   Ht 5\' 8"  (1.727 m)   Wt 168 lb (76.2 kg)   SpO2 99%   BMI 25.54 kg/m   BP Readings from Last 3 Encounters:  05/01/17 (!) 152/84  04/08/17 (!) 150/88  03/12/17 138/88    Wt Readings from Last 3 Encounters:  05/01/17 168 lb (76.2 kg)  04/08/17 169 lb (76.7 kg)  03/12/17 169 lb (76.7 kg)    Physical Exam  Constitutional: She appears well-developed. No distress.  HENT:  Head: Normocephalic.  Right Ear: External ear normal.  Left Ear: External ear normal.  Nose: Nose normal.  Mouth/Throat: Oropharynx is clear and moist.  Eyes: Conjunctivae are normal. Pupils are equal, round, and reactive to light. Right eye exhibits no discharge. Left eye exhibits no discharge.  Neck: Normal range of motion. Neck supple. No JVD present. No tracheal deviation present. No thyromegaly present.  Cardiovascular: Normal rate, regular rhythm and normal heart sounds.  Pulmonary/Chest: No stridor. No respiratory distress.  She has no wheezes.  Abdominal: Soft. Bowel sounds are normal. She exhibits no distension and no mass. There is no tenderness. There is no rebound and no guarding.  Musculoskeletal: She exhibits no edema or tenderness.  Lymphadenopathy:    She has no cervical adenopathy.  Neurological: She displays normal reflexes. No cranial nerve deficit. She exhibits normal muscle tone. Coordination normal.  Skin: No rash noted. No erythema.  Psychiatric: She has a normal mood and affect. Her behavior is normal. Judgment and thought content normal.  eryth throat F/u HTN  Lab Results  Component Value Date   WBC 6.4 03/12/2017   HGB 12.2 03/12/2017   HCT 36.9 03/12/2017   PLT 248.0 03/12/2017   GLUCOSE 93 03/12/2017   CHOL 236 (H) 11/02/2016   TRIG 100.0 11/02/2016   HDL 60.60 11/02/2016   LDLDIRECT 188.2 01/10/2012   LDLCALC 155 (H) 11/02/2016   ALT 8 11/02/2016   AST 12 11/02/2016   NA 140 03/12/2017   K 4.3 03/12/2017   CL 105 03/12/2017   CREATININE 1.13 03/12/2017   BUN 20 03/12/2017   CO2 30 03/12/2017   TSH 2.14 11/02/2016   INR 1.09 08/22/2009    Dg Shoulder Right  Result Date: 02/23/2017 CLINICAL DATA:  Shoulder pain for 2 months.  No known injury. EXAM: RIGHT SHOULDER - 2+ VIEW COMPARISON:  None. FINDINGS: There is no evidence of fracture or dislocation. There is generalized osteopenia. There is no evidence of arthropathy or other focal bone abnormality. Soft tissues are unremarkable. IMPRESSION: No acute osseous injury of the right shoulder. Electronically Signed   By: Elige KoHetal  Patel   On: 02/23/2017 09:35    Assessment & Plan:   There are no diagnoses linked to this encounter. I have discontinued Passion C. Frerichs's methylPREDNISolone. I am also having her maintain her aspirin, Cholecalciferol, NIFEdipine, doxepin, metoprolol tartrate, furosemide, levothyroxine, potassium chloride SA, mupirocin ointment, clorazepate, LORazepam, estradiol cypionate, and nitroGLYCERIN. We will continue  to administer estradiol cypionate.  No orders of the defined types were placed in this encounter.    Follow-up: No Follow-up on file.  Sonda PrimesAlex Plotnikov, MD

## 2017-05-01 NOTE — Assessment & Plan Note (Signed)
Toprol, Nifedipine 

## 2017-05-01 NOTE — Addendum Note (Signed)
Addended by: Scarlett PrestoFRIEDENBACH, BETTY on: 05/01/2017 03:03 PM   Modules accepted: Orders

## 2017-05-01 NOTE — Addendum Note (Signed)
Addended by: Scarlett PrestoFRIEDENBACH, BETTY on: 05/01/2017 04:23 PM   Modules accepted: Orders

## 2017-05-01 NOTE — Assessment & Plan Note (Signed)
On Levothroid 

## 2017-05-01 NOTE — Telephone Encounter (Signed)
Medication interaction question. Pharmacist saying there is an interaction and wants to know if it's ok to take the Z-pack with Doxepin.  The number is [phone] to the pharmacy.  Thanks.

## 2017-05-01 NOTE — Telephone Encounter (Signed)
Copied from CRM [id number]. Topic: Quick Communication - See Telephone Encounter >> May 01, 2017  3:33 PM Cipriano BunkerLambe, Annette S wrote: CRM for notification. See Telephone encounter for:  [phone] asking if  Z-pack can this be taken with Doxepin  05/01/17.

## 2017-05-08 ENCOUNTER — Encounter: Payer: Self-pay | Admitting: Internal Medicine

## 2017-05-08 ENCOUNTER — Ambulatory Visit (INDEPENDENT_AMBULATORY_CARE_PROVIDER_SITE_OTHER): Payer: Medicare Other | Admitting: Internal Medicine

## 2017-05-08 DIAGNOSIS — J069 Acute upper respiratory infection, unspecified: Secondary | ICD-10-CM | POA: Diagnosis not present

## 2017-05-08 NOTE — Assessment & Plan Note (Signed)
Pt took Wal-Tussin and Keflex - a little better. CXR if needed Off work

## 2017-05-08 NOTE — Progress Notes (Signed)
Subjective:  Patient ID: [name], female    DOB: 06/27/1945  Age: 71 y.o. MRN: [id number]  CC: No chief complaint on file.   HPI [name] presents for URI sx's x1 week; the pt took [employer]-Tussin and Keflex - a little better. Feeling better, not well yet  Outpatient Medications Prior to Visit  Medication Sig Dispense Refill  . aspirin 81 MG EC tablet Take 81 mg by mouth daily.      . Cholecalciferol 1000 UNITS tablet Take 1,000 Units by mouth daily.      . clorazepate (TRANXENE) 15 MG tablet Take 1 tablet (15 mg total) by mouth 3 (three) times daily as needed. for anxiety 270 tablet 1  . doxepin (SINEQUAN) 50 MG capsule Take 3 capsules (150 mg total) by mouth at bedtime. 270 capsule 3  . estradiol cypionate (DEPO-ESTRADIOL) 5 MG/ML injection Use 0.3 ml every 18-25 days IM 5 mL 3  . furosemide (LASIX) 40 MG tablet Take 1 tablet (40 mg total) by mouth 2 (two) times daily. 180 tablet 3  . levothyroxine (SYNTHROID, LEVOTHROID) 125 MCG tablet Take 1 tablet (125 mcg total) by mouth daily. 90 tablet 3  . LORazepam (ATIVAN) 0.5 MG tablet Take 1 tablet (0.5 mg total) by mouth at bedtime. 90 tablet 1  . metoprolol (LOPRESSOR) 50 MG tablet Take 1 tablet (50 mg total) by mouth 3 (three) times daily. 270 tablet 3  . mupirocin ointment (BACTROBAN) 2 % Apply 1 application topically daily.    Marland Kitchen. NIFEdipine (PROCARDIA-XL/ADALAT-CC/NIFEDICAL-XL) 30 MG 24 hr tablet Take 1 tablet (30 mg total) by mouth daily. 90 tablet 3  . nitroGLYCERIN (NITROLINGUAL) 0.4 MG/SPRAY spray PLACE 1 SPRAY UNDER THE TONGUE EVERY 5 MINUTES AS NEEDED FOR CHEST PAIN. MAY REPEAT 3 TIMES 12 g 1  . potassium chloride SA (KLOR-CON M20) 20 MEQ tablet Take 1 tablet (20 mEq total) by mouth 2 (two) times daily. 180 tablet 3  . azithromycin (ZITHROMAX Z-PAK) 250 MG tablet As directed 6 each 0  . cephALEXin (KEFLEX) 500 MG capsule Take 1 capsule (500 mg total) by mouth 4 (four) times daily. 20 capsule 0   Facility-Administered Medications  Prior to Visit  Medication Dose Route Frequency Provider Last Rate Last Dose  . estradiol cypionate (DEPO-ESTRADIOL) 5 MG/ML injection 0.5 mg  0.5 mg Intramuscular Q30 days Plotnikov, Georgina QuintAleksei V, MD   0.5 mg at 04/08/17 1534    ROS Review of Systems  Constitutional: Negative for activity change, appetite change, chills, fatigue and unexpected weight change.  HENT: Negative for congestion, mouth sores and sinus pressure.   Eyes: Negative for visual disturbance.  Respiratory: Positive for cough. Negative for chest tightness.   Gastrointestinal: Negative for abdominal pain and nausea.  Genitourinary: Negative for difficulty urinating, frequency and vaginal pain.  Musculoskeletal: Negative for back pain and gait problem.  Skin: Negative for pallor and rash.  Neurological: Negative for dizziness, tremors, weakness, numbness and headaches.  Psychiatric/Behavioral: Negative for confusion and sleep disturbance.    Objective:  BP 134/72 (BP Location: Left Arm, Patient Position: Sitting, Cuff Size: Large)   Pulse 66   Temp 97.6 F (36.4 C) (Oral)   Ht 5\' 8"  (1.727 m)   Wt 169 lb (76.7 kg)   SpO2 99%   BMI 25.70 kg/m   BP Readings from Last 3 Encounters:  05/08/17 134/72  05/01/17 (!) 152/84  04/08/17 (!) 150/88    Wt Readings from Last 3 Encounters:  05/08/17 169 lb (76.7 kg)  05/01/17 168 lb (76.2 kg)  04/08/17 169 lb (76.7 kg)    Physical Exam  Constitutional: She appears well-developed. No distress.  HENT:  Head: Normocephalic.  Right Ear: External ear normal.  Left Ear: External ear normal.  Nose: Nose normal.  Mouth/Throat: Oropharynx is clear and moist.  Eyes: Conjunctivae are normal. Pupils are equal, round, and reactive to light. Right eye exhibits no discharge. Left eye exhibits no discharge.  Neck: Normal range of motion. Neck supple. No JVD present. No tracheal deviation present. No thyromegaly present.  Cardiovascular: Normal rate, regular rhythm and normal heart  sounds.  Pulmonary/Chest: No stridor. No respiratory distress. She has no wheezes.  Abdominal: Soft. Bowel sounds are normal. She exhibits no distension and no mass. There is no tenderness. There is no rebound and no guarding.  Musculoskeletal: She exhibits no edema or tenderness.  Lymphadenopathy:    She has no cervical adenopathy.  Neurological: She displays normal reflexes. No cranial nerve deficit. She exhibits normal muscle tone. Coordination normal.  Skin: No rash noted. No erythema.  Psychiatric: She has a normal mood and affect. Her behavior is normal. Judgment and thought content normal.  eryth throat coughing  Lab Results  Component Value Date   WBC 6.4 03/12/2017   HGB 12.2 03/12/2017   HCT 36.9 03/12/2017   PLT 248.0 03/12/2017   GLUCOSE 93 03/12/2017   CHOL 236 (H) 11/02/2016   TRIG 100.0 11/02/2016   HDL 60.60 11/02/2016   LDLDIRECT 188.2 01/10/2012   LDLCALC 155 (H) 11/02/2016   ALT 8 11/02/2016   AST 12 11/02/2016   NA 140 03/12/2017   K 4.3 03/12/2017   CL 105 03/12/2017   CREATININE 1.13 03/12/2017   BUN 20 03/12/2017   CO2 30 03/12/2017   TSH 2.14 11/02/2016   INR 1.09 08/22/2009    Dg Shoulder Right  Result Date: 02/23/2017 CLINICAL DATA:  Shoulder pain for 2 months.  No known injury. EXAM: RIGHT SHOULDER - 2+ VIEW COMPARISON:  None. FINDINGS: There is no evidence of fracture or dislocation. There is generalized osteopenia. There is no evidence of arthropathy or other focal bone abnormality. Soft tissues are unremarkable. IMPRESSION: No acute osseous injury of the right shoulder. Electronically Signed   By: Elige KoHetal  Patel   On: 02/23/2017 09:35    Assessment & Plan:   There are no diagnoses linked to this encounter. I have discontinued [name]'s azithromycin and cephALEXin. I am also having her maintain her aspirin, Cholecalciferol, NIFEdipine, doxepin, metoprolol tartrate, furosemide, levothyroxine, potassium chloride SA, mupirocin ointment,  clorazepate, LORazepam, estradiol cypionate, and nitroGLYCERIN. We will continue to administer estradiol cypionate.  No orders of the defined types were placed in this encounter.    Follow-up: No Follow-up on file.  Sonda PrimesAlex Plotnikov, MD

## 2017-05-09 DIAGNOSIS — Z1231 Encounter for screening mammogram for malignant neoplasm of breast: Secondary | ICD-10-CM | POA: Diagnosis not present

## 2017-05-09 DIAGNOSIS — Z803 Family history of malignant neoplasm of breast: Secondary | ICD-10-CM | POA: Diagnosis not present

## 2017-05-09 LAB — HM MAMMOGRAPHY

## 2017-05-16 ENCOUNTER — Encounter: Payer: Self-pay | Admitting: Family Medicine

## 2017-05-16 ENCOUNTER — Ambulatory Visit (INDEPENDENT_AMBULATORY_CARE_PROVIDER_SITE_OTHER): Payer: Medicare Other | Admitting: Family Medicine

## 2017-05-16 DIAGNOSIS — M25411 Effusion, right shoulder: Secondary | ICD-10-CM | POA: Diagnosis not present

## 2017-05-16 NOTE — Progress Notes (Signed)
[name] StallRuby C [name] - 71 y.o. female MRN [id number]  Date of birth: 12/11/1945  SUBJECTIVE:  Including CC & ROS.  Chief Complaint  Patient presents with  . Right shoulde pain    [name] is a 71 y.o. female that is presenting with right shoulder pain. Previously seen in September for same pain. Pain has been increasing  over the past four weeks. Pain is constant sharp pain, keeps her awake at night. Laying on her shoulder helps with the pain. Denies tingling or numbness. The pain feels similar to previous pain. There is some pain in her upper arm but no radicular symptoms. Pain is reproduced with abduction movements and moving it in certain directions. The injection in the joint helped her until recently. No recent injury.   She was seen on 10/5 and an aspiration and injection were performed of her right shoulder.   Independent review of the right shoulder xray from 10/5 shows normal joint space.    Review of Systems  Constitutional: Negative for fever.  HENT: Negative for sinus pressure.   Respiratory: Negative for shortness of breath.   Cardiovascular: Negative for chest pain.  Gastrointestinal: Negative for abdominal pain.  Musculoskeletal: Positive for arthralgias. Negative for gait problem.  Skin: Negative for color change.  Neurological: Negative for weakness.  Hematological: Negative for adenopathy.    HISTORY: Past Medical, Surgical, Social, and Family History Reviewed & Updated per EMR.   Pertinent Historical Findings include:  Past Medical History:  Diagnosis Date  . Anxiety state, unspecified   . Bronchitis, acute   . Carotid bruit   . Coronary atherosclerosis of unspecified type of vessel, native or graft    multiple angioplasy procedures in 90's/early 2000's  . De Quervain's tenosynovitis   . Depressive disorder, not elsewhere classified   . Female bladder prolapse   . Lichen 2011   Vaginal  . Other and unspecified hyperlipidemia   . Unspecified essential  hypertension   . Unspecified hypothyroidism     No past surgical history on file.  Allergies  Allergen Reactions  . Epinephrine Other (See Comments)    unknown  . Isosorbide Mononitrate Other (See Comments)    Severe headache  . Lidocaine Nausea And Vomiting  . Morphine Other (See Comments)    unknown  . Propoxyphene N-Acetaminophen Other (See Comments)    unknown  . Alprazolam Other (See Comments)    REACTION: very sedating  . Atorvastatin Other (See Comments)    REACTION: weakness  . Codeine Nausea And Vomiting  . Ezetimibe Other (See Comments)    REACTION: myalgia  . Prilosec [Omeprazole] Diarrhea and Other (See Comments)    Chest discomfort  . Rosuvastatin Other (See Comments)    REACTION: leg weakness  . Simvastatin Other (See Comments)    REACTION: leg  weakness  . Allopurinol     ?bruising per pt  . Meloxicam     diarrhea  . Tetracycline Nausea And Vomiting  . Amoxicillin Nausea And Vomiting  . Ciprofloxacin Nausea And Vomiting  . Diphenhydramine Hcl Anxiety    Family History  Problem Relation Age of Onset  . Hypertension Mother   . Hypertension Father   . Hypertension Other   . Coronary artery disease Other      Social History   Socioeconomic History  . Marital status: Widowed    Spouse name: Not on file  . Number of children: Not on file  . Years of education: Not on file  . Highest education level:  Not on file  Social Needs  . Financial resource strain: Not on file  . Food insecurity - worry: Not on file  . Food insecurity - inability: Not on file  . Transportation needs - medical: Not on file  . Transportation needs - non-medical: Not on file  Occupational History  . Not on file  Tobacco Use  . Smoking status: Former Smoker    Last attempt to quit: 07/22/1991    Years since quitting: 25.8  . Smokeless tobacco: Never Used  Substance and Sexual Activity  . Alcohol use: No  . Drug use: No  . Sexual activity: Not Currently  Other Topics  Concern  . 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     PHYSICAL EXAM:  VS: BP 138/72 (BP Location: Left Arm, Patient Position: Sitting, Cuff Size: Normal)   Pulse 64   Temp 97.9 F (36.6 C) (Oral)   Ht 5\' 8"  (1.727 m)   Wt 169 lb (76.7 kg)   SpO2 100%   BMI 25.70 kg/m  Physical Exam Gen: NAD, alert, cooperative with exam, well-appearing ENT: normal lips, normal nasal mucosa,  Eye: normal EOM, normal conjunctiva and lids CV:  no edema, +2 pedal pulses   Resp: no accessory muscle use, non-labored,  GI: no masses or tenderness, no hernia  Skin: no rashes, no areas of induration  Neuro: normal tone, normal sensation to touch Psych:  normal insight, alert and oriented MSK:  Right shoulder:  No TTP of the [facility]  Normal ER  Normal strength to resistance with IR and ER  Normal active ROM  Pain with empty can testing  Normal grip strength  Normal cross arm test.  Neurovascularly intact.   Limited ultrasound: right shoulder:  Fluid superficial to the BT but no encircling it.  Effusion noted within the [facility]  Chronic changes of the supraspinatus   Summary: effusion of the [facility] joint   Ultrasound and interpretation by Clare GandyJeremy Schmitz, MD     Aspiration/Injection Procedure Note [name] Stalluby C Subramaniam 11/17/1945  Procedure: Injection Indications: right shoulder pain   Procedure Details Consent: Risks of procedure as well as the alternatives and risks of each were explained to the (patient/caregiver).  Consent for procedure obtained. Time Out: Verified patient identification, verified procedure, site/side was marked, verified correct patient position, special equipment/implants available, medications/allergies/relevent history reviewed, required imaging and test results available.  Performed.  The area was cleaned with iodine and alcohol swabs.    The right GH joint was injected using 1 cc's of 40 mg  Depomedrol and 4 cc's of 1% lidocaine with a 22 1 1/2" needle.  Ultrasound was used. Images were obtained in Transverse views showing the injection.    A sterile dressing was applied.  Patient did tolerate procedure well.          ASSESSMENT & PLAN:   Shoulder effusion, right No significant OA on xray. 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  - Glenohumeral injection today  - counseled on HEP  - may consider MRI if having to need repeated injections.

## 2017-05-16 NOTE — Patient Instructions (Signed)
Please take tylenol for the pain.    Glucosamine sulfate 750mg  twice a day is a supplement that has been shown to help moderate to severe arthritis.  Vitamin D 2000 IU daily  Fish oil 2 grams daily.   Tumeric 500mg  twice daily.   Capsaicin topically up to four times a day may also help with pain.

## 2017-05-17 NOTE — Assessment & Plan Note (Signed)
No significant OA on xray. 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  - Glenohumeral injection today  - counseled on HEP  - may consider MRI if having to need repeated injections.

## 2017-05-28 ENCOUNTER — Encounter: Payer: Self-pay | Admitting: Internal Medicine

## 2017-05-28 NOTE — Progress Notes (Signed)
Outside notes received. Information abstracted. Notes sent to scan.  

## 2017-05-31 ENCOUNTER — Encounter: Payer: Self-pay | Admitting: Internal Medicine

## 2017-05-31 ENCOUNTER — Ambulatory Visit (INDEPENDENT_AMBULATORY_CARE_PROVIDER_SITE_OTHER): Payer: Medicare Other | Admitting: Internal Medicine

## 2017-05-31 DIAGNOSIS — L659 Nonscarring hair loss, unspecified: Secondary | ICD-10-CM

## 2017-05-31 NOTE — Patient Instructions (Signed)
Rogain for women Biotin Vitamin B complex, Vitamin D  We can try Finasteride 

## 2017-05-31 NOTE — Progress Notes (Signed)
Subjective:  Patient ID: [name] StallRuby C [name], female    DOB: 11/29/1945  Age: 72 y.o. MRN: [id number]  CC: No chief complaint on file.   HPI [name] presents for hair loss, anxiety f/u. C/o hair loss   Outpatient Medications Prior to Visit  Medication Sig Dispense Refill  . aspirin 81 MG EC tablet Take 81 mg by mouth daily.      . Cholecalciferol 1000 UNITS tablet Take 1,000 Units by mouth daily.      . clorazepate (TRANXENE) 15 MG tablet Take 1 tablet (15 mg total) by mouth 3 (three) times daily as needed. for anxiety 270 tablet 1  . doxepin (SINEQUAN) 50 MG capsule Take 3 capsules (150 mg total) by mouth at bedtime. 270 capsule 3  . estradiol cypionate (DEPO-ESTRADIOL) 5 MG/ML injection Use 0.3 ml every 18-25 days IM 5 mL 3  . furosemide (LASIX) 40 MG tablet Take 1 tablet (40 mg total) by mouth 2 (two) times daily. 180 tablet 3  . levothyroxine (SYNTHROID, LEVOTHROID) 125 MCG tablet Take 1 tablet (125 mcg total) by mouth daily. 90 tablet 3  . LORazepam (ATIVAN) 0.5 MG tablet Take 1 tablet (0.5 mg total) by mouth at bedtime. 90 tablet 1  . metoprolol (LOPRESSOR) 50 MG tablet Take 1 tablet (50 mg total) by mouth 3 (three) times daily. 270 tablet 3  . NIFEdipine (PROCARDIA-XL/ADALAT-CC/NIFEDICAL-XL) 30 MG 24 hr tablet Take 1 tablet (30 mg total) by mouth daily. 90 tablet 3  . nitroGLYCERIN (NITROLINGUAL) 0.4 MG/SPRAY spray PLACE 1 SPRAY UNDER THE TONGUE EVERY 5 MINUTES AS NEEDED FOR CHEST PAIN. MAY REPEAT 3 TIMES 12 g 1  . potassium chloride SA (KLOR-CON M20) 20 MEQ tablet Take 1 tablet (20 mEq total) by mouth 2 (two) times daily. 180 tablet 3  . mupirocin ointment (BACTROBAN) 2 % Apply 1 application topically daily.     Facility-Administered Medications Prior to Visit  Medication Dose Route Frequency Provider Last Rate Last Dose  . estradiol cypionate (DEPO-ESTRADIOL) 5 MG/ML injection 0.5 mg  0.5 mg Intramuscular Q30 days Plotnikov, Georgina QuintAleksei V, MD   0.5 mg at 04/08/17 1534    ROS Review  of Systems  Constitutional: Negative for activity change, appetite change, chills, fatigue and unexpected weight change.  HENT: Negative for congestion, mouth sores and sinus pressure.   Eyes: Negative for visual disturbance.  Respiratory: Negative for cough and chest tightness.   Gastrointestinal: Negative for abdominal pain and nausea.  Genitourinary: Negative for difficulty urinating, frequency and vaginal pain.  Musculoskeletal: Negative for back pain and gait problem.  Skin: Negative for pallor and rash.  Neurological: Negative for dizziness, tremors, weakness, numbness and headaches.  Psychiatric/Behavioral: Negative for confusion and sleep disturbance.    Objective:  BP 128/76 (BP Location: Left Arm, Patient Position: Sitting, Cuff Size: Large)   Pulse 74   Temp (!) 97.5 F (36.4 C) (Oral)   Ht 5\' 8"  (1.727 m)   Wt 168 lb (76.2 kg)   SpO2 98%   BMI 25.54 kg/m   BP Readings from Last 3 Encounters:  05/31/17 128/76  05/16/17 138/72  05/08/17 134/72    Wt Readings from Last 3 Encounters:  05/31/17 168 lb (76.2 kg)  05/16/17 169 lb (76.7 kg)  05/08/17 169 lb (76.7 kg)    Physical Exam  Constitutional: She appears well-developed. No distress.  HENT:  Head: Normocephalic.  Right Ear: External ear normal.  Left Ear: External ear normal.  Nose: Nose normal.  Mouth/Throat: Oropharynx  is clear and moist.  Eyes: Conjunctivae are normal. Pupils are equal, round, and reactive to light. Right eye exhibits no discharge. Left eye exhibits no discharge.  Neck: Normal range of motion. Neck supple. No JVD present. No tracheal deviation present. No thyromegaly present.  Cardiovascular: Normal rate, regular rhythm and normal heart sounds.  Pulmonary/Chest: No stridor. No respiratory distress. She has no wheezes.  Abdominal: Soft. Bowel sounds are normal. She exhibits no distension and no mass. There is no tenderness. There is no rebound and no guarding.  Musculoskeletal: She  exhibits no edema or tenderness.  Lymphadenopathy:    She has no cervical adenopathy.  Neurological: She displays normal reflexes. No cranial nerve deficit. She exhibits normal muscle tone. Coordination normal.  Skin: No rash noted. No erythema.  Psychiatric: She has a normal mood and affect. Her behavior is normal. Judgment and thought content normal.    Lab Results  Component Value Date   WBC 6.4 03/12/2017   HGB 12.2 03/12/2017   HCT 36.9 03/12/2017   PLT 248.0 03/12/2017   GLUCOSE 93 03/12/2017   CHOL 236 (H) 11/02/2016   TRIG 100.0 11/02/2016   HDL 60.60 11/02/2016   LDLDIRECT 188.2 01/10/2012   LDLCALC 155 (H) 11/02/2016   ALT 8 11/02/2016   AST 12 11/02/2016   NA 140 03/12/2017   K 4.3 03/12/2017   CL 105 03/12/2017   CREATININE 1.13 03/12/2017   BUN 20 03/12/2017   CO2 30 03/12/2017   TSH 2.14 11/02/2016   INR 1.09 08/22/2009    Dg Shoulder Right  Result Date: 02/23/2017 CLINICAL DATA:  Shoulder pain for 2 months.  No known injury. EXAM: RIGHT SHOULDER - 2+ VIEW COMPARISON:  None. FINDINGS: There is no evidence of fracture or dislocation. There is generalized osteopenia. There is no evidence of arthropathy or other focal bone abnormality. Soft tissues are unremarkable. IMPRESSION: No acute osseous injury of the right shoulder. Electronically Signed   By: Elige Ko   On: 02/23/2017 09:35    Assessment & Plan:   There are no diagnoses linked to this encounter. I have discontinued [name]'s mupirocin ointment. I am also having her maintain her aspirin, Cholecalciferol, NIFEdipine, doxepin, metoprolol tartrate, furosemide, levothyroxine, potassium chloride SA, clorazepate, LORazepam, estradiol cypionate, and nitroGLYCERIN. We will continue to administer estradiol cypionate.  No orders of the defined types were placed in this encounter.    Follow-up: No Follow-up on file.  Sonda Primes, MD

## 2017-05-31 NOTE — Assessment & Plan Note (Signed)
Rogain for women Biotin Vitamin B complex, Vitamin D  We can try Finasteride

## 2017-06-03 ENCOUNTER — Ambulatory Visit: Payer: Medicare Other | Admitting: Internal Medicine

## 2017-06-28 ENCOUNTER — Ambulatory Visit (INDEPENDENT_AMBULATORY_CARE_PROVIDER_SITE_OTHER): Payer: Medicare Other | Admitting: Internal Medicine

## 2017-06-28 ENCOUNTER — Encounter: Payer: Self-pay | Admitting: Internal Medicine

## 2017-06-28 DIAGNOSIS — I10 Essential (primary) hypertension: Secondary | ICD-10-CM | POA: Diagnosis not present

## 2017-06-28 DIAGNOSIS — M10011 Idiopathic gout, right shoulder: Secondary | ICD-10-CM | POA: Diagnosis not present

## 2017-06-28 DIAGNOSIS — N951 Menopausal and female climacteric states: Secondary | ICD-10-CM

## 2017-06-28 MED ORDER — DOXEPIN HCL 50 MG PO CAPS: 150.0000 mg | ORAL_CAPSULE | Freq: Every day | ORAL | 3 refills | Status: DC

## 2017-06-28 NOTE — Assessment & Plan Note (Signed)
Doing ok.

## 2017-06-28 NOTE — Progress Notes (Signed)
Subjective:  Patient ID: [name] StallRuby C [name], female    DOB: 02/01/1946  Age: 72 y.o. MRN: [id number]  CC: No chief complaint on file.   HPI [name] presents for HTN, hot flashes f/u  Outpatient Medications Prior to Visit  Medication Sig Dispense Refill  . aspirin 81 MG EC tablet Take 81 mg by mouth daily.      . Cholecalciferol 1000 UNITS tablet Take 1,000 Units by mouth daily.      . clorazepate (TRANXENE) 15 MG tablet Take 1 tablet (15 mg total) by mouth 3 (three) times daily as needed. for anxiety 270 tablet 1  . doxepin (SINEQUAN) 50 MG capsule Take 3 capsules (150 mg total) by mouth at bedtime. 270 capsule 3  . estradiol cypionate (DEPO-ESTRADIOL) 5 MG/ML injection Use 0.3 ml every 18-25 days IM 5 mL 3  . furosemide (LASIX) 40 MG tablet Take 1 tablet (40 mg total) by mouth 2 (two) times daily. 180 tablet 3  . levothyroxine (SYNTHROID, LEVOTHROID) 125 MCG tablet Take 1 tablet (125 mcg total) by mouth daily. 90 tablet 3  . LORazepam (ATIVAN) 0.5 MG tablet Take 1 tablet (0.5 mg total) by mouth at bedtime. 90 tablet 1  . metoprolol (LOPRESSOR) 50 MG tablet Take 1 tablet (50 mg total) by mouth 3 (three) times daily. 270 tablet 3  . NIFEdipine (PROCARDIA-XL/ADALAT-CC/NIFEDICAL-XL) 30 MG 24 hr tablet Take 1 tablet (30 mg total) by mouth daily. 90 tablet 3  . nitroGLYCERIN (NITROLINGUAL) 0.4 MG/SPRAY spray PLACE 1 SPRAY UNDER THE TONGUE EVERY 5 MINUTES AS NEEDED FOR CHEST PAIN. MAY REPEAT 3 TIMES 12 g 1  . potassium chloride SA (KLOR-CON M20) 20 MEQ tablet Take 1 tablet (20 mEq total) by mouth 2 (two) times daily. 180 tablet 3   Facility-Administered Medications Prior to Visit  Medication Dose Route Frequency Provider Last Rate Last Dose  . estradiol cypionate (DEPO-ESTRADIOL) 5 MG/ML injection 0.5 mg  0.5 mg Intramuscular Q30 days Plotnikov, Georgina QuintAleksei V, MD   0.5 mg at 04/08/17 1534    ROS Review of Systems  Constitutional: Negative for activity change, appetite change, chills, fatigue and  unexpected weight change.  HENT: Negative for congestion, mouth sores and sinus pressure.   Eyes: Negative for visual disturbance.  Respiratory: Negative for cough and chest tightness.   Gastrointestinal: Negative for abdominal pain and nausea.  Genitourinary: Negative for difficulty urinating, frequency and vaginal pain.  Musculoskeletal: Positive for arthralgias and back pain. Negative for gait problem.  Skin: Negative for pallor and rash.  Neurological: Negative for dizziness, tremors, weakness, numbness and headaches.  Psychiatric/Behavioral: Negative for confusion and sleep disturbance.    Objective:  BP 124/80 (BP Location: Left Arm, Patient Position: Sitting, Cuff Size: Normal)   Pulse 71   Temp 97.8 F (36.6 C) (Oral)   Ht 5\' 8"  (1.727 m)   Wt 164 lb 4 oz (74.5 kg)   SpO2 99%   BMI 24.97 kg/m   BP Readings from Last 3 Encounters:  06/28/17 124/80  05/31/17 128/76  05/16/17 138/72    Wt Readings from Last 3 Encounters:  06/28/17 164 lb 4 oz (74.5 kg)  05/31/17 168 lb (76.2 kg)  05/16/17 169 lb (76.7 kg)    Physical Exam  Constitutional: She appears well-developed. No distress.  HENT:  Head: Normocephalic.  Right Ear: External ear normal.  Left Ear: External ear normal.  Nose: Nose normal.  Mouth/Throat: Oropharynx is clear and moist.  Eyes: Conjunctivae are normal. Pupils are equal,  round, and reactive to light. Right eye exhibits no discharge. Left eye exhibits no discharge.  Neck: Normal range of motion. Neck supple. No JVD present. No tracheal deviation present. No thyromegaly present.  Cardiovascular: Normal rate, regular rhythm and normal heart sounds.  Pulmonary/Chest: No stridor. No respiratory distress. She has no wheezes.  Abdominal: Soft. Bowel sounds are normal. She exhibits no distension and no mass. There is no tenderness. There is no rebound and no guarding.  Musculoskeletal: She exhibits no edema or tenderness.  Lymphadenopathy:    She has no  cervical adenopathy.  Neurological: She displays normal reflexes. No cranial nerve deficit. She exhibits normal muscle tone. Coordination normal.  Skin: No rash noted. No erythema.  Psychiatric: She has a normal mood and affect. Her behavior is normal. Judgment and thought content normal.    Lab Results  Component Value Date   WBC 6.4 03/12/2017   HGB 12.2 03/12/2017   HCT 36.9 03/12/2017   PLT 248.0 03/12/2017   GLUCOSE 93 03/12/2017   CHOL 236 (H) 11/02/2016   TRIG 100.0 11/02/2016   HDL 60.60 11/02/2016   LDLDIRECT 188.2 01/10/2012   LDLCALC 155 (H) 11/02/2016   ALT 8 11/02/2016   AST 12 11/02/2016   NA 140 03/12/2017   K 4.3 03/12/2017   CL 105 03/12/2017   CREATININE 1.13 03/12/2017   BUN 20 03/12/2017   CO2 30 03/12/2017   TSH 2.14 11/02/2016   INR 1.09 08/22/2009    Dg Shoulder Right  Result Date: 02/23/2017 CLINICAL DATA:  Shoulder pain for 2 months.  No known injury. EXAM: RIGHT SHOULDER - 2+ VIEW COMPARISON:  None. FINDINGS: There is no evidence of fracture or dislocation. There is generalized osteopenia. There is no evidence of arthropathy or other focal bone abnormality. Soft tissues are unremarkable. IMPRESSION: No acute osseous injury of the right shoulder. Electronically Signed   By: Elige Ko   On: 02/23/2017 09:35    Assessment & Plan:   There are no diagnoses linked to this encounter. I am having [name] maintain her aspirin, Cholecalciferol, NIFEdipine, doxepin, metoprolol tartrate, furosemide, levothyroxine, potassium chloride SA, clorazepate, LORazepam, estradiol cypionate, and nitroGLYCERIN. We will continue to administer estradiol cypionate.  No orders of the defined types were placed in this encounter.    Follow-up: No Follow-up on file.  Sonda Primes, MD

## 2017-06-28 NOTE — Assessment & Plan Note (Signed)
Toprol, Nifedipine 

## 2017-06-28 NOTE — Assessment & Plan Note (Signed)
Depo-Estra 0.3 ml  Potential benefits of a long term HRT  use as well as potential risks  and complications were explained to the patient and were aknowledged.

## 2017-07-08 ENCOUNTER — Other Ambulatory Visit: Payer: Self-pay | Admitting: Internal Medicine

## 2017-07-08 NOTE — Telephone Encounter (Signed)
Routing to dr plotnikov, please advise, thanks 

## 2017-07-25 ENCOUNTER — Ambulatory Visit (INDEPENDENT_AMBULATORY_CARE_PROVIDER_SITE_OTHER): Payer: Medicare Other | Admitting: Internal Medicine

## 2017-07-25 ENCOUNTER — Encounter: Payer: Self-pay | Admitting: Internal Medicine

## 2017-07-25 VITALS — BP 132/78 | HR 64 | Temp 98.2°F | Ht 68.0 in | Wt 170.0 lb

## 2017-07-25 DIAGNOSIS — N951 Menopausal and female climacteric states: Secondary | ICD-10-CM

## 2017-07-25 DIAGNOSIS — E034 Atrophy of thyroid (acquired): Secondary | ICD-10-CM

## 2017-07-25 DIAGNOSIS — I10 Essential (primary) hypertension: Secondary | ICD-10-CM | POA: Diagnosis not present

## 2017-07-25 DIAGNOSIS — J069 Acute upper respiratory infection, unspecified: Secondary | ICD-10-CM

## 2017-07-25 DIAGNOSIS — Z79890 Hormone replacement therapy (postmenopausal): Secondary | ICD-10-CM | POA: Diagnosis not present

## 2017-07-25 MED ORDER — ESTRADIOL CYPIONATE 5 MG/ML IM OIL
1.5000 mg | TOPICAL_OIL | Freq: Once | INTRAMUSCULAR | Status: AC
  Administered 2017-07-25: 1.5 mg via INTRAMUSCULAR

## 2017-07-25 NOTE — Assessment & Plan Note (Signed)
Toprol, Nifedipine 

## 2017-07-25 NOTE — Progress Notes (Signed)
Subjective:  Patient ID: [name], female    DOB: 12/20/1945  Age: 72 y.o. MRN: [id number]  CC: No chief complaint on file.   HPI [name] StallRuby C Lapier presents for URI sx's F/u HRT and HTN  Outpatient Medications Prior to Visit  Medication Sig Dispense Refill  . aspirin 81 MG EC tablet Take 81 mg by mouth daily.      . Cholecalciferol 1000 UNITS tablet Take 1,000 Units by mouth daily.      . clorazepate (TRANXENE) 15 MG tablet Take 1 tablet (15 mg total) by mouth 3 (three) times daily as needed. for anxiety 270 tablet 1  . doxepin (SINEQUAN) 50 MG capsule Take 3 capsules (150 mg total) by mouth at bedtime. 270 capsule 3  . estradiol cypionate (DEPO-ESTRADIOL) 5 MG/ML injection Use 0.3 ml every 18-25 days IM 5 mL 3  . furosemide (LASIX) 40 MG tablet Take 1 tablet (40 mg total) by mouth 2 (two) times daily. 180 tablet 3  . levothyroxine (SYNTHROID, LEVOTHROID) 125 MCG tablet Take 1 tablet (125 mcg total) by mouth daily. 90 tablet 3  . LORazepam (ATIVAN) 0.5 MG tablet TAKE 1 TABLET BY MOUTH AT BEDTIME 90 tablet 1  . metoprolol (LOPRESSOR) 50 MG tablet Take 1 tablet (50 mg total) by mouth 3 (three) times daily. 270 tablet 3  . NIFEdipine (PROCARDIA-XL/ADALAT-CC/NIFEDICAL-XL) 30 MG 24 hr tablet Take 1 tablet (30 mg total) by mouth daily. 90 tablet 3  . nitroGLYCERIN (NITROLINGUAL) 0.4 MG/SPRAY spray PLACE 1 SPRAY UNDER THE TONGUE EVERY 5 MINUTES AS NEEDED FOR CHEST PAIN. MAY REPEAT 3 TIMES 12 g 1  . potassium chloride SA (KLOR-CON M20) 20 MEQ tablet Take 1 tablet (20 mEq total) by mouth 2 (two) times daily. 180 tablet 3  . estradiol cypionate (DEPO-ESTRADIOL) 5 MG/ML injection 0.5 mg      No facility-administered medications prior to visit.     ROS Review of Systems  Constitutional: Negative for activity change, appetite change, chills, fatigue and unexpected weight change.  HENT: Positive for congestion. Negative for mouth sores and sinus pressure.   Eyes: Negative for visual disturbance.    Respiratory: Negative for cough and chest tightness.   Gastrointestinal: Negative for abdominal pain and nausea.  Genitourinary: Negative for difficulty urinating, frequency and vaginal pain.  Musculoskeletal: Positive for arthralgias and back pain. Negative for gait problem.  Skin: Negative for pallor and rash.  Neurological: Negative for dizziness, tremors, weakness, numbness and headaches.  Psychiatric/Behavioral: Negative for confusion and sleep disturbance.    Objective:  BP 132/78 (BP Location: Left Arm, Patient Position: Sitting, Cuff Size: Normal)   Pulse 64   Temp 98.2 F (36.8 C) (Oral)   Ht 5\' 8"  (1.727 m)   Wt 170 lb (77.1 kg)   SpO2 99%   BMI 25.85 kg/m   BP Readings from Last 3 Encounters:  07/25/17 132/78  06/28/17 124/80  05/31/17 128/76    Wt Readings from Last 3 Encounters:  07/25/17 170 lb (77.1 kg)  06/28/17 164 lb 4 oz (74.5 kg)  05/31/17 168 lb (76.2 kg)    Physical Exam  Constitutional: She appears well-developed. No distress.  HENT:  Head: Normocephalic.  Right Ear: External ear normal.  Left Ear: External ear normal.  Nose: Nose normal.  Mouth/Throat: Oropharynx is clear and moist.  Eyes: Conjunctivae are normal. Pupils are equal, round, and reactive to light. Right eye exhibits no discharge. Left eye exhibits no discharge.  Neck: Normal range of motion. Neck  supple. No JVD present. No tracheal deviation present. No thyromegaly present.  Cardiovascular: Normal rate, regular rhythm and normal heart sounds.  Pulmonary/Chest: No stridor. No respiratory distress. She has no wheezes.  Abdominal: Soft. Bowel sounds are normal. She exhibits no distension and no mass. There is no tenderness. There is no rebound and no guarding.  Musculoskeletal: She exhibits no edema or tenderness.  Lymphadenopathy:    She has no cervical adenopathy.  Neurological: She displays normal reflexes. No cranial nerve deficit. She exhibits normal muscle tone. Coordination  normal.  Skin: No rash noted. No erythema.  Psychiatric: She has a normal mood and affect. Her behavior is normal. Judgment and thought content normal.    Lab Results  Component Value Date   WBC 6.4 03/12/2017   HGB 12.2 03/12/2017   HCT 36.9 03/12/2017   PLT 248.0 03/12/2017   GLUCOSE 93 03/12/2017   CHOL 236 (H) 11/02/2016   TRIG 100.0 11/02/2016   HDL 60.60 11/02/2016   LDLDIRECT 188.2 01/10/2012   LDLCALC 155 (H) 11/02/2016   ALT 8 11/02/2016   AST 12 11/02/2016   NA 140 03/12/2017   K 4.3 03/12/2017   CL 105 03/12/2017   CREATININE 1.13 03/12/2017   BUN 20 03/12/2017   CO2 30 03/12/2017   TSH 2.14 11/02/2016   INR 1.09 08/22/2009    Dg Shoulder Right  Result Date: 02/23/2017 CLINICAL DATA:  Shoulder pain for 2 months.  No known injury. EXAM: RIGHT SHOULDER - 2+ VIEW COMPARISON:  None. FINDINGS: There is no evidence of fracture or dislocation. There is generalized osteopenia. There is no evidence of arthropathy or other focal bone abnormality. Soft tissues are unremarkable. IMPRESSION: No acute osseous injury of the right shoulder. Electronically Signed   By: Elige Ko   On: 02/23/2017 09:35    Assessment & Plan:   There are no diagnoses linked to this encounter. I am having Loralei C. Bade maintain her aspirin, Cholecalciferol, NIFEdipine, metoprolol tartrate, furosemide, levothyroxine, potassium chloride SA, clorazepate, estradiol cypionate, nitroGLYCERIN, doxepin, and LORazepam. We will stop administering estradiol cypionate.  No orders of the defined types were placed in this encounter.    Follow-up: No Follow-up on file.  Sonda Primes, MD

## 2017-07-25 NOTE — Assessment & Plan Note (Signed)
Resolving

## 2017-07-25 NOTE — Assessment & Plan Note (Signed)
Levothroid 

## 2017-07-25 NOTE — Assessment & Plan Note (Signed)
Chronic HRT estradiol injections  Potential benefits of a long term HRT use as well as potential risks  and complications were explained to the patient and were aknowledged. 

## 2017-07-26 ENCOUNTER — Ambulatory Visit: Payer: Medicare Other | Admitting: Internal Medicine

## 2017-08-10 DIAGNOSIS — H2513 Age-related nuclear cataract, bilateral: Secondary | ICD-10-CM | POA: Diagnosis not present

## 2017-08-28 ENCOUNTER — Encounter: Payer: Self-pay | Admitting: Internal Medicine

## 2017-08-28 ENCOUNTER — Ambulatory Visit (INDEPENDENT_AMBULATORY_CARE_PROVIDER_SITE_OTHER): Payer: Medicare Other | Admitting: Internal Medicine

## 2017-08-28 VITALS — BP 126/78 | HR 67 | Temp 98.0°F | Ht 68.0 in | Wt 167.0 lb

## 2017-08-28 DIAGNOSIS — G8929 Other chronic pain: Secondary | ICD-10-CM | POA: Diagnosis not present

## 2017-08-28 DIAGNOSIS — N951 Menopausal and female climacteric states: Secondary | ICD-10-CM

## 2017-08-28 DIAGNOSIS — M79672 Pain in left foot: Secondary | ICD-10-CM | POA: Diagnosis not present

## 2017-08-28 MED ORDER — NITROGLYCERIN 0.4 MG/SPRAY TL SOLN: 1 refills | Status: DC

## 2017-08-28 MED ORDER — METHYLPREDNISOLONE ACETATE 40 MG/ML IJ SUSP
40.0000 mg | Freq: Once | INTRAMUSCULAR | Status: AC
  Administered 2017-08-28: 40 mg via INTRA_ARTICULAR

## 2017-08-28 MED ORDER — POTASSIUM CHLORIDE CRYS ER 20 MEQ PO TBCR: 20.0000 meq | EXTENDED_RELEASE_TABLET | Freq: Two times a day (BID) | ORAL | 3 refills | Status: DC

## 2017-08-28 MED ORDER — LEVOTHYROXINE SODIUM 125 MCG PO TABS: 125.0000 ug | ORAL_TABLET | Freq: Every day | ORAL | 3 refills | Status: DC

## 2017-08-28 MED ORDER — METOPROLOL TARTRATE 50 MG PO TABS: 50.0000 mg | ORAL_TABLET | Freq: Three times a day (TID) | ORAL | 3 refills | Status: DC

## 2017-08-28 MED ORDER — NIFEDIPINE ER OSMOTIC RELEASE 30 MG PO TB24: 30.0000 mg | ORAL_TABLET | Freq: Every day | ORAL | 3 refills | Status: DC

## 2017-08-28 MED ORDER — FUROSEMIDE 40 MG PO TABS: 40.0000 mg | ORAL_TABLET | Freq: Two times a day (BID) | ORAL | 3 refills | Status: DC

## 2017-08-28 MED ORDER — ESTRADIOL CYPIONATE 5 MG/ML IM OIL
1.5000 mg | TOPICAL_OIL | INTRAMUSCULAR | Status: DC
  Administered 2017-08-28 – 2020-02-25 (×20): 1.5 mg via INTRAMUSCULAR

## 2017-08-28 MED ORDER — CLORAZEPATE DIPOTASSIUM 15 MG PO TABS: 15.0000 mg | ORAL_TABLET | Freq: Three times a day (TID) | ORAL | 1 refills | Status: DC | PRN

## 2017-08-28 NOTE — Progress Notes (Signed)
Subjective:  Patient ID: [name], female    DOB: 30-Apr-1946  Age: 72 y.o. MRN: [id number]  CC: No chief complaint on file.   HPI [name] presents for menopausal sx's f/u C/o L heel pain - severe F/u HTN  Outpatient Medications Prior to Visit  Medication Sig Dispense Refill  . aspirin 81 MG EC tablet Take 81 mg by mouth daily.      . Cholecalciferol 1000 UNITS tablet Take 1,000 Units by mouth daily.      . clorazepate (TRANXENE) 15 MG tablet Take 1 tablet (15 mg total) by mouth 3 (three) times daily as needed. for anxiety 270 tablet 1  . doxepin (SINEQUAN) 50 MG capsule Take 3 capsules (150 mg total) by mouth at bedtime. 270 capsule 3  . estradiol cypionate (DEPO-ESTRADIOL) 5 MG/ML injection Use 0.3 ml every 18-25 days IM 5 mL 3  . furosemide (LASIX) 40 MG tablet Take 1 tablet (40 mg total) by mouth 2 (two) times daily. 180 tablet 3  . levothyroxine (SYNTHROID, LEVOTHROID) 125 MCG tablet Take 1 tablet (125 mcg total) by mouth daily. 90 tablet 3  . LORazepam (ATIVAN) 0.5 MG tablet TAKE 1 TABLET BY MOUTH AT BEDTIME 90 tablet 1  . metoprolol (LOPRESSOR) 50 MG tablet Take 1 tablet (50 mg total) by mouth 3 (three) times daily. 270 tablet 3  . NIFEdipine (PROCARDIA-XL/ADALAT-CC/NIFEDICAL-XL) 30 MG 24 hr tablet Take 1 tablet (30 mg total) by mouth daily. 90 tablet 3  . nitroGLYCERIN (NITROLINGUAL) 0.4 MG/SPRAY spray PLACE 1 SPRAY UNDER THE TONGUE EVERY 5 MINUTES AS NEEDED FOR CHEST PAIN. MAY REPEAT 3 TIMES 12 g 1  . potassium chloride SA (KLOR-CON M20) 20 MEQ tablet Take 1 tablet (20 mEq total) by mouth 2 (two) times daily. 180 tablet 3   No facility-administered medications prior to visit.     ROS Review of Systems  Constitutional: Negative for activity change, appetite change, chills, fatigue and unexpected weight change.  HENT: Negative for congestion, mouth sores and sinus pressure.   Eyes: Negative for visual disturbance.  Respiratory: Negative for cough and chest tightness.    Gastrointestinal: Negative for abdominal pain and nausea.  Genitourinary: Negative for difficulty urinating, frequency and vaginal pain.  Musculoskeletal: Positive for arthralgias, back pain and gait problem.  Skin: Negative for pallor and rash.  Neurological: Negative for dizziness, tremors, weakness, numbness and headaches.  Psychiatric/Behavioral: Negative for confusion and sleep disturbance. The patient is nervous/anxious.     Objective:  BP 126/78 (BP Location: Left Arm, Patient Position: Sitting, Cuff Size: Normal)   Pulse 67   Temp 98 F (36.7 C) (Oral)   Ht 5\' 8"  (1.727 m)   Wt 167 lb (75.8 kg)   SpO2 98%   BMI 25.39 kg/m   BP Readings from Last 3 Encounters:  08/28/17 126/78  07/25/17 132/78  06/28/17 124/80    Wt Readings from Last 3 Encounters:  08/28/17 167 lb (75.8 kg)  07/25/17 170 lb (77.1 kg)  06/28/17 164 lb 4 oz (74.5 kg)    Physical Exam  Constitutional: She appears well-developed. No distress.  HENT:  Head: Normocephalic.  Right Ear: External ear normal.  Left Ear: External ear normal.  Nose: Nose normal.  Mouth/Throat: Oropharynx is clear and moist.  Eyes: Pupils are equal, round, and reactive to light. Conjunctivae are normal. Right eye exhibits no discharge. Left eye exhibits no discharge.  Neck: Normal range of motion. Neck supple. No JVD present. No tracheal deviation present.  No thyromegaly present.  Cardiovascular: Normal rate, regular rhythm and normal heart sounds.  Pulmonary/Chest: No stridor. No respiratory distress. She has no wheezes.  Abdominal: Soft. Bowel sounds are normal. She exhibits no distension and no mass. There is no tenderness. There is no rebound and no guarding.  Musculoskeletal: She exhibits tenderness. She exhibits no edema.  Lymphadenopathy:    She has no cervical adenopathy.  Neurological: She displays normal reflexes. No cranial nerve deficit. She exhibits normal muscle tone. Coordination normal.  Skin: No rash  noted. No erythema.  Psychiatric: She has a normal mood and affect. Her behavior is normal. Judgment and thought content normal.    L heel is very painful medially     Procedure Note :     Procedure : L heel injection   Indication: L heel  plantar fasciitis with refractory  chronic pain.   Risks including unsuccessful procedure , bleeding, infection, bruising, skin atrophy, "steroid flare-up" and others were explained to the patient in detail as well as the benefits. Informed consent was obtained and signed.   Tthe patient was placed in a comfortable  decubitus position. The point of maximal tenderness medially was identified. Skin was prepped with Betadine and alcohol. Then, a 5 cc syringe with a 1.5 inch long 25-gauge needle was used for a heel injection.. The needle was advanced and I injected the heel with 2 mL of 2% lidocaine and 40 mg of Depo-Medrol .  Band-Aid was applied.   Tolerated well. Complications: None. Good pain relief following the procedure.      Lab Results  Component Value Date   WBC 6.4 03/12/2017   HGB 12.2 03/12/2017   HCT 36.9 03/12/2017   PLT 248.0 03/12/2017   GLUCOSE 93 03/12/2017   CHOL 236 (H) 11/02/2016   TRIG 100.0 11/02/2016   HDL 60.60 11/02/2016   LDLDIRECT 188.2 01/10/2012   LDLCALC 155 (H) 11/02/2016   ALT 8 11/02/2016   AST 12 11/02/2016   NA 140 03/12/2017   K 4.3 03/12/2017   CL 105 03/12/2017   CREATININE 1.13 03/12/2017   BUN 20 03/12/2017   CO2 30 03/12/2017   TSH 2.14 11/02/2016   INR 1.09 08/22/2009    Dg Shoulder Right  Result Date: 02/23/2017 CLINICAL DATA:  Shoulder pain for 2 months.  No known injury. EXAM: RIGHT SHOULDER - 2+ VIEW COMPARISON:  None. FINDINGS: There is no evidence of fracture or dislocation. There is generalized osteopenia. There is no evidence of arthropathy or other focal bone abnormality. Soft tissues are unremarkable. IMPRESSION: No acute osseous injury of the right shoulder. Electronically Signed    By: Elige KoHetal  Patel   On: 02/23/2017 09:35    Assessment & Plan:   There are no diagnoses linked to this encounter. I am having [name] maintain her aspirin, Cholecalciferol, NIFEdipine, metoprolol tartrate, furosemide, levothyroxine, potassium chloride SA, clorazepate, estradiol cypionate, nitroGLYCERIN, doxepin, and LORazepam.  No orders of the defined types were placed in this encounter.    Follow-up: No follow-ups on file.  Sonda PrimesAlex Plotnikov, MD

## 2017-08-28 NOTE — Assessment & Plan Note (Signed)
See procedure 

## 2017-09-15 ENCOUNTER — Other Ambulatory Visit: Payer: Self-pay | Admitting: Internal Medicine

## 2017-09-25 ENCOUNTER — Encounter: Payer: Self-pay | Admitting: Internal Medicine

## 2017-09-25 ENCOUNTER — Ambulatory Visit (INDEPENDENT_AMBULATORY_CARE_PROVIDER_SITE_OTHER): Payer: Medicare Other | Admitting: Internal Medicine

## 2017-09-25 DIAGNOSIS — Z79890 Hormone replacement therapy (postmenopausal): Secondary | ICD-10-CM | POA: Diagnosis not present

## 2017-09-25 DIAGNOSIS — F329 Major depressive disorder, single episode, unspecified: Secondary | ICD-10-CM | POA: Diagnosis not present

## 2017-09-25 DIAGNOSIS — M10011 Idiopathic gout, right shoulder: Secondary | ICD-10-CM | POA: Diagnosis not present

## 2017-09-25 DIAGNOSIS — I10 Essential (primary) hypertension: Secondary | ICD-10-CM | POA: Diagnosis not present

## 2017-09-25 DIAGNOSIS — I2510 Atherosclerotic heart disease of native coronary artery without angina pectoris: Secondary | ICD-10-CM

## 2017-09-25 NOTE — Assessment & Plan Note (Signed)
Toprol, Nifedipine 

## 2017-09-25 NOTE — Assessment & Plan Note (Signed)
Chronic HRT estradiol injections

## 2017-09-25 NOTE — Assessment & Plan Note (Signed)
Allopurinol  

## 2017-09-25 NOTE — Assessment & Plan Note (Signed)
Toprol, Procardia 

## 2017-09-25 NOTE — Patient Instructions (Signed)
Black cherry extract 

## 2017-09-25 NOTE — Assessment & Plan Note (Signed)
Discussed.

## 2017-09-25 NOTE — Progress Notes (Signed)
Subjective:  Patient ID: [name], female    DOB: [birth year]  Age: 72 y.o. MRN: [id number]  CC: No chief complaint on file.   HPI SHANTERA MONTS presents for anxiety, foot pain R, menopausal sx's  Outpatient Medications Prior to Visit  Medication Sig Dispense Refill  . aspirin 81 MG EC tablet Take 81 mg by mouth daily.      . Cholecalciferol 1000 UNITS tablet Take 1,000 Units by mouth daily.      . clorazepate (TRANXENE) 15 MG tablet Take 1 tablet (15 mg total) by mouth 3 (three) times daily as needed. for anxiety 270 tablet 1  . doxepin (SINEQUAN) 50 MG capsule Take 3 capsules (150 mg total) by mouth at bedtime. 270 capsule 3  . estradiol cypionate (DEPO-ESTRADIOL) 5 MG/ML injection Use 0.3 ml every 18-25 days IM 5 mL 3  . furosemide (LASIX) 40 MG tablet Take 1 tablet (40 mg total) by mouth 2 (two) times daily. 180 tablet 3  . levothyroxine (SYNTHROID, LEVOTHROID) 125 MCG tablet Take 1 tablet (125 mcg total) by mouth daily. 90 tablet 3  . LORazepam (ATIVAN) 0.5 MG tablet TAKE 1 TABLET BY MOUTH AT BEDTIME 90 tablet 1  . metoprolol tartrate (LOPRESSOR) 50 MG tablet Take 1 tablet (50 mg total) by mouth 3 (three) times daily. 270 tablet 3  . NIFEdipine (PROCARDIA-XL/ADALAT-CC/NIFEDICAL-XL) 30 MG 24 hr tablet Take 1 tablet (30 mg total) by mouth daily. 90 tablet 3  . NIFEdipine (PROCARDIA-XL/ADALAT-CC/NIFEDICAL-XL) 30 MG 24 hr tablet TAKE 1 TABLET BY MOUTH ONCE DAILY 90 tablet 3  . nitroGLYCERIN (NITROLINGUAL) 0.4 MG/SPRAY spray PLACE 1 SPRAY UNDER THE TONGUE EVERY 5 MINUTES AS NEEDED FOR CHEST PAIN. MAY REPEAT 3 TIMES 12 g 1  . potassium chloride SA (KLOR-CON M20) 20 MEQ tablet Take 1 tablet (20 mEq total) by mouth 2 (two) times daily. 180 tablet 3   Facility-Administered Medications Prior to Visit  Medication Dose Route Frequency Provider Last Rate Last Dose  . estradiol cypionate (DEPO-ESTRADIOL) 5 MG/ML injection 1.5 mg  1.5 mg Intramuscular Q30 days Keionte Swicegood, Georgina Quint, MD   1.5 mg at  08/28/17 1532    ROS Review of Systems  Constitutional: Positive for fatigue. Negative for activity change, appetite change, chills and unexpected weight change.  HENT: Negative for congestion, mouth sores and sinus pressure.   Eyes: Negative for visual disturbance.  Respiratory: Negative for cough and chest tightness.   Gastrointestinal: Negative for abdominal pain and nausea.  Genitourinary: Negative for difficulty urinating, frequency and vaginal pain.  Musculoskeletal: Positive for arthralgias. Negative for back pain and gait problem.  Skin: Negative for pallor and rash.  Neurological: Negative for dizziness, tremors, weakness, numbness and headaches.  Psychiatric/Behavioral: Negative for confusion and sleep disturbance. The patient is nervous/anxious.     Objective:  BP (!) 144/88 (BP Location: Left Arm, Patient Position: Sitting, Cuff Size: Normal)   Pulse 74   Temp 98 F (36.7 C) (Oral)   Ht  (1.727 m)   Wt 165 lb (74.8 kg)   SpO2 98%   BMI 25.09 kg/m   BP Readings from Last 3 Encounters:  09/25/17 (!) 144/88  08/28/17 126/78  07/25/17 132/78    Wt Readings from Last 3 Encounters:  09/25/17 165 lb (74.8 kg)  08/28/17 167 lb (75.8 kg)  07/25/17 170 lb (77.1 kg)    Physical Exam  Constitutional: She appears well-developed. No distress.  HENT:  Head: Normocephalic.  Right Ear: External ear normal.  Left Ear: External ear normal.  Nose: Nose normal.  Mouth/Throat: Oropharynx is clear and moist.  Eyes: Pupils are equal, round, and reactive to light. Conjunctivae are normal. Right eye exhibits no discharge. Left eye exhibits no discharge.  Neck: Normal range of motion. Neck supple. No JVD present. No tracheal deviation present. No thyromegaly present.  Cardiovascular: Normal rate, regular rhythm and normal heart sounds.  Pulmonary/Chest: No stridor. No respiratory distress. She has no wheezes.  Abdominal: Soft. Bowel sounds are normal. She exhibits no  distension and no mass. There is no tenderness. There is no rebound and no guarding.  Musculoskeletal: She exhibits edema and tenderness.  Lymphadenopathy:    She has no cervical adenopathy.  Neurological: She displays normal reflexes. No cranial nerve deficit. She exhibits normal muscle tone. Coordination normal.  Skin: No rash noted. No erythema.  Psychiatric: Her behavior is normal. Judgment and thought content normal.   L ankle is swollen and sensitive Tearful  Lab Results  Component Value Date   WBC 6.4 03/12/2017   HGB 12.2 03/12/2017   HCT 36.9 03/12/2017   PLT 248.0 03/12/2017   GLUCOSE 93 03/12/2017   CHOL 236 (H) 11/02/2016   TRIG 100.0 11/02/2016   HDL 60.60 11/02/2016   LDLDIRECT 188.2 01/10/2012   LDLCALC 155 (H) 11/02/2016   ALT 8 11/02/2016   AST 12 11/02/2016   NA 140 03/12/2017   K 4.3 03/12/2017   CL 105 03/12/2017   CREATININE 1.13 03/12/2017   BUN 20 03/12/2017   CO2 30 03/12/2017   TSH 2.14 11/02/2016   INR 1.09 08/22/2009    Dg Shoulder Right  Result Date: 02/23/2017 CLINICAL DATA:  Shoulder pain for 2 months.  No known injury. EXAM: RIGHT SHOULDER - 2+ VIEW COMPARISON:  None. FINDINGS: There is no evidence of fracture or dislocation. There is generalized osteopenia. There is no evidence of arthropathy or other focal bone abnormality. Soft tissues are unremarkable. IMPRESSION: No acute osseous injury of the right shoulder. Electronically Signed   By: Elige Ko   On: 02/23/2017 09:35    Assessment & Plan:   There are no diagnoses linked to this encounter. I am having Patric C. Gillham maintain her aspirin, Cholecalciferol, estradiol cypionate, doxepin, LORazepam, clorazepate, furosemide, levothyroxine, metoprolol tartrate, NIFEdipine, potassium chloride SA, nitroGLYCERIN, and NIFEdipine. We will continue to administer estradiol cypionate.  No orders of the defined types were placed in this encounter.    Follow-up: No follow-ups on file.  Sonda Primes, MD

## 2017-10-29 ENCOUNTER — Ambulatory Visit (INDEPENDENT_AMBULATORY_CARE_PROVIDER_SITE_OTHER): Payer: Medicare Other | Admitting: Internal Medicine

## 2017-10-29 ENCOUNTER — Encounter: Payer: Self-pay | Admitting: Internal Medicine

## 2017-10-29 DIAGNOSIS — E034 Atrophy of thyroid (acquired): Secondary | ICD-10-CM

## 2017-10-29 DIAGNOSIS — Z79890 Hormone replacement therapy (postmenopausal): Secondary | ICD-10-CM | POA: Diagnosis not present

## 2017-10-29 DIAGNOSIS — N951 Menopausal and female climacteric states: Secondary | ICD-10-CM

## 2017-10-29 DIAGNOSIS — M7662 Achilles tendinitis, left leg: Secondary | ICD-10-CM | POA: Diagnosis not present

## 2017-10-29 DIAGNOSIS — I10 Essential (primary) hypertension: Secondary | ICD-10-CM

## 2017-10-29 MED ORDER — METHYLPREDNISOLONE 4 MG PO TBPK: ORAL_TABLET | ORAL | 0 refills | Status: DC

## 2017-10-29 NOTE — Assessment & Plan Note (Signed)
Losartan, Toprol, Nifedipine

## 2017-10-29 NOTE — Assessment & Plan Note (Signed)
Levothroid 

## 2017-10-29 NOTE — Assessment & Plan Note (Signed)
Will inject °

## 2017-10-29 NOTE — Assessment & Plan Note (Signed)
Worse Work less Ice Medrol dose-pack

## 2017-10-29 NOTE — Progress Notes (Signed)
Subjective:  Patient ID: [name], female    DOB: 26-Dec-1945  Age: 72 y.o. MRN: [id number]  CC: No chief complaint on file.   HPI [name] presents for menopausal sx's, anxiety, HTN f/u C/o L ankle pain - worse x 2 weeks. Working 28 h/wk on her feet...  Outpatient Medications Prior to Visit  Medication Sig Dispense Refill  . aspirin 81 MG EC tablet Take 81 mg by mouth daily.      . Cholecalciferol 1000 UNITS tablet Take 1,000 Units by mouth daily.      . clorazepate (TRANXENE) 15 MG tablet Take 1 tablet (15 mg total) by mouth 3 (three) times daily as needed. for anxiety 270 tablet 1  . doxepin (SINEQUAN) 50 MG capsule Take 3 capsules (150 mg total) by mouth at bedtime. 270 capsule 3  . estradiol cypionate (DEPO-ESTRADIOL) 5 MG/ML injection Use 0.3 ml every 18-25 days IM 5 mL 3  . furosemide (LASIX) 40 MG tablet Take 1 tablet (40 mg total) by mouth 2 (two) times daily. 180 tablet 3  . levothyroxine (SYNTHROID, LEVOTHROID) 125 MCG tablet Take 1 tablet (125 mcg total) by mouth daily. 90 tablet 3  . LORazepam (ATIVAN) 0.5 MG tablet TAKE 1 TABLET BY MOUTH AT BEDTIME 90 tablet 1  . metoprolol tartrate (LOPRESSOR) 50 MG tablet Take 1 tablet (50 mg total) by mouth 3 (three) times daily. 270 tablet 3  . NIFEdipine (PROCARDIA-XL/ADALAT-CC/NIFEDICAL-XL) 30 MG 24 hr tablet Take 1 tablet (30 mg total) by mouth daily. 90 tablet 3  . NIFEdipine (PROCARDIA-XL/ADALAT-CC/NIFEDICAL-XL) 30 MG 24 hr tablet TAKE 1 TABLET BY MOUTH ONCE DAILY 90 tablet 3  . nitroGLYCERIN (NITROLINGUAL) 0.4 MG/SPRAY spray PLACE 1 SPRAY UNDER THE TONGUE EVERY 5 MINUTES AS NEEDED FOR CHEST PAIN. MAY REPEAT 3 TIMES 12 g 1  . potassium chloride SA (KLOR-CON M20) 20 MEQ tablet Take 1 tablet (20 mEq total) by mouth 2 (two) times daily. 180 tablet 3   Facility-Administered Medications Prior to Visit  Medication Dose Route Frequency Provider Last Rate Last Dose  . estradiol cypionate (DEPO-ESTRADIOL) 5 MG/ML injection 1.5 mg   1.5 mg Intramuscular Q30 days Plotnikov, Georgina Quint, MD   1.5 mg at 08/28/17 1532    ROS: Review of Systems  Constitutional: Negative for activity change, appetite change, chills, fatigue and unexpected weight change.  HENT: Negative for congestion, mouth sores and sinus pressure.   Eyes: Negative for visual disturbance.  Respiratory: Negative for cough and chest tightness.   Gastrointestinal: Negative for abdominal pain and nausea.  Genitourinary: Negative for difficulty urinating, frequency and vaginal pain.  Musculoskeletal: Positive for arthralgias. Negative for back pain and gait problem.  Skin: Negative for pallor and rash.  Neurological: Negative for dizziness, tremors, weakness, numbness and headaches.  Psychiatric/Behavioral: Negative for confusion and sleep disturbance. The patient is nervous/anxious.     Objective:  BP (!) 162/88 (BP Location: Left Arm, Patient Position: Sitting, Cuff Size: Normal)   Pulse 64   Temp 98.2 F (36.8 C) (Oral)   Ht 5\' 8"  (1.727 m)   Wt 167 lb (75.8 kg)   SpO2 98%   BMI 25.39 kg/m   BP Readings from Last 3 Encounters:  10/29/17 (!) 162/88  09/25/17 (!) 144/88  08/28/17 126/78    Wt Readings from Last 3 Encounters:  10/29/17 167 lb (75.8 kg)  09/25/17 165 lb (74.8 kg)  08/28/17 167 lb (75.8 kg)    Physical Exam  Constitutional: She appears well-developed. No  distress.  HENT:  Head: Normocephalic.  Right Ear: External ear normal.  Left Ear: External ear normal.  Nose: Nose normal.  Mouth/Throat: Oropharynx is clear and moist.  Eyes: Pupils are equal, round, and reactive to light. Conjunctivae are normal. Right eye exhibits no discharge. Left eye exhibits no discharge.  Neck: Normal range of motion. Neck supple. No JVD present. No tracheal deviation present. No thyromegaly present.  Cardiovascular: Normal rate, regular rhythm and normal heart sounds.  Pulmonary/Chest: No stridor. No respiratory distress. She has no wheezes.    Abdominal: Soft. Bowel sounds are normal. She exhibits no distension and no mass. There is no tenderness. There is no rebound and no guarding.  Musculoskeletal: She exhibits tenderness. She exhibits no edema.  Lymphadenopathy:    She has no cervical adenopathy.  Neurological: She displays normal reflexes. No cranial nerve deficit. She exhibits normal muscle tone. Coordination normal.  Skin: No rash noted. No erythema.  Psychiatric: Her behavior is normal. Judgment and thought content normal.  L ankle is tender over achilles tendon, swollen  Lab Results  Component Value Date   WBC 6.4 03/12/2017   HGB 12.2 03/12/2017   HCT 36.9 03/12/2017   PLT 248.0 03/12/2017   GLUCOSE 93 03/12/2017   CHOL 236 (H) 11/02/2016   TRIG 100.0 11/02/2016   HDL 60.60 11/02/2016   LDLDIRECT 188.2 01/10/2012   LDLCALC 155 (H) 11/02/2016   ALT 8 11/02/2016   AST 12 11/02/2016   NA 140 03/12/2017   K 4.3 03/12/2017   CL 105 03/12/2017   CREATININE 1.13 03/12/2017   BUN 20 03/12/2017   CO2 30 03/12/2017   TSH 2.14 11/02/2016   INR 1.09 08/22/2009    Dg Shoulder Right  Result Date: 02/23/2017 CLINICAL DATA:  Shoulder pain for 2 months.  No known injury. EXAM: RIGHT SHOULDER - 2+ VIEW COMPARISON:  None. FINDINGS: There is no evidence of fracture or dislocation. There is generalized osteopenia. There is no evidence of arthropathy or other focal bone abnormality. Soft tissues are unremarkable. IMPRESSION: No acute osseous injury of the right shoulder. Electronically Signed   By: Elige KoHetal  Patel   On: 02/23/2017 09:35    Assessment & Plan:   There are no diagnoses linked to this encounter.   No orders of the defined types were placed in this encounter.    Follow-up: No follow-ups on file.  Sonda PrimesAlex Plotnikov, MD

## 2017-10-29 NOTE — Assessment & Plan Note (Signed)
inject today

## 2017-11-27 ENCOUNTER — Encounter: Payer: Self-pay | Admitting: Internal Medicine

## 2017-11-27 ENCOUNTER — Ambulatory Visit (INDEPENDENT_AMBULATORY_CARE_PROVIDER_SITE_OTHER): Payer: Medicare Other | Admitting: Internal Medicine

## 2017-11-27 DIAGNOSIS — M10011 Idiopathic gout, right shoulder: Secondary | ICD-10-CM

## 2017-11-27 DIAGNOSIS — N951 Menopausal and female climacteric states: Secondary | ICD-10-CM

## 2017-11-27 DIAGNOSIS — M7662 Achilles tendinitis, left leg: Secondary | ICD-10-CM

## 2017-11-27 DIAGNOSIS — I10 Essential (primary) hypertension: Secondary | ICD-10-CM | POA: Diagnosis not present

## 2017-11-27 DIAGNOSIS — F419 Anxiety disorder, unspecified: Secondary | ICD-10-CM

## 2017-11-27 DIAGNOSIS — E034 Atrophy of thyroid (acquired): Secondary | ICD-10-CM | POA: Diagnosis not present

## 2017-11-27 NOTE — Assessment & Plan Note (Signed)
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

## 2017-11-27 NOTE — Progress Notes (Signed)
Subjective:  Patient ID: [name], female    DOB: December 30, 1945  Age: 72 y.o. MRN: [id number]  CC: No chief complaint on file.   HPI LOVENE MARET presents for Lfoot pain and swelling - worse w/standing at work F/u HTN, HRT   Outpatient Medications Prior to Visit  Medication Sig Dispense Refill  . aspirin 81 MG EC tablet Take 81 mg by mouth daily.      . Cholecalciferol 1000 UNITS tablet Take 1,000 Units by mouth daily.      . clorazepate (TRANXENE) 15 MG tablet Take 1 tablet (15 mg total) by mouth 3 (three) times daily as needed. for anxiety 270 tablet 1  . doxepin (SINEQUAN) 50 MG capsule Take 3 capsules (150 mg total) by mouth at bedtime. 270 capsule 3  . estradiol cypionate (DEPO-ESTRADIOL) 5 MG/ML injection Use 0.3 ml every 18-25 days IM 5 mL 3  . furosemide (LASIX) 40 MG tablet Take 1 tablet (40 mg total) by mouth 2 (two) times daily. 180 tablet 3  . levothyroxine (SYNTHROID, LEVOTHROID) 125 MCG tablet Take 1 tablet (125 mcg total) by mouth daily. 90 tablet 3  . LORazepam (ATIVAN) 0.5 MG tablet TAKE 1 TABLET BY MOUTH AT BEDTIME 90 tablet 1  . metoprolol tartrate (LOPRESSOR) 50 MG tablet Take 1 tablet (50 mg total) by mouth 3 (three) times daily. 270 tablet 3  . NIFEdipine (PROCARDIA-XL/ADALAT-CC/NIFEDICAL-XL) 30 MG 24 hr tablet Take 1 tablet (30 mg total) by mouth daily. 90 tablet 3  . NIFEdipine (PROCARDIA-XL/ADALAT-CC/NIFEDICAL-XL) 30 MG 24 hr tablet TAKE 1 TABLET BY MOUTH ONCE DAILY 90 tablet 3  . nitroGLYCERIN (NITROLINGUAL) 0.4 MG/SPRAY spray PLACE 1 SPRAY UNDER THE TONGUE EVERY 5 MINUTES AS NEEDED FOR CHEST PAIN. MAY REPEAT 3 TIMES 12 g 1  . potassium chloride SA (KLOR-CON M20) 20 MEQ tablet Take 1 tablet (20 mEq total) by mouth 2 (two) times daily. 180 tablet 3  . methylPREDNISolone (MEDROL DOSEPAK) 4 MG TBPK tablet As directed (Patient not taking: Reported on 11/27/2017) 21 tablet 0   Facility-Administered Medications Prior to Visit  Medication Dose Route Frequency Provider  Last Rate Last Dose  . estradiol cypionate (DEPO-ESTRADIOL) 5 MG/ML injection 1.5 mg  1.5 mg Intramuscular Q30 days Plotnikov, Georgina Quint, MD   1.5 mg at 11/27/17 1548    ROS: Review of Systems  Constitutional: Positive for fatigue. Negative for activity change, appetite change, chills and unexpected weight change.  HENT: Negative for congestion, mouth sores and sinus pressure.   Eyes: Negative for visual disturbance.  Respiratory: Negative for cough and chest tightness.   Gastrointestinal: Negative for abdominal pain and nausea.  Genitourinary: Negative for difficulty urinating, frequency and vaginal pain.  Musculoskeletal: Positive for arthralgias and back pain. Negative for gait problem.  Skin: Negative for pallor and rash.  Neurological: Negative for dizziness, tremors, weakness, numbness and headaches.  Psychiatric/Behavioral: Positive for dysphoric mood. Negative for confusion and sleep disturbance. The patient is nervous/anxious.     Objective:  BP (!) 152/86 (BP Location: Left Arm, Patient Position: Sitting, Cuff Size: Normal)   Pulse 74   Temp 97.9 F (36.6 C) (Oral)   Ht 5\' 8"  (1.727 m)   Wt 165 lb (74.8 kg)   SpO2 99%   BMI 25.09 kg/m   BP Readings from Last 3 Encounters:  11/27/17 (!) 152/86  10/29/17 (!) 162/88  09/25/17 (!) 144/88    Wt Readings from Last 3 Encounters:  11/27/17 165 lb (74.8 kg)  10/29/17 167  lb (75.8 kg)  09/25/17 165 lb (74.8 kg)    Physical Exam  Constitutional: She appears well-developed. No distress.  HENT:  Head: Normocephalic.  Right Ear: External ear normal.  Left Ear: External ear normal.  Nose: Nose normal.  Mouth/Throat: Oropharynx is clear and moist.  Eyes: Pupils are equal, round, and reactive to light. Conjunctivae are normal. Right eye exhibits no discharge. Left eye exhibits no discharge.  Neck: Normal range of motion. Neck supple. No JVD present. No tracheal deviation present. No thyromegaly present.  Cardiovascular:  Normal rate, regular rhythm and normal heart sounds.  Pulmonary/Chest: No stridor. No respiratory distress. She has no wheezes.  Abdominal: Soft. Bowel sounds are normal. She exhibits no distension and no mass. There is no tenderness. There is no rebound and no guarding.  Musculoskeletal: She exhibits edema and tenderness.  Lymphadenopathy:    She has no cervical adenopathy.  Neurological: She displays normal reflexes. No cranial nerve deficit. She exhibits normal muscle tone. Coordination normal.  Skin: No rash noted. No erythema.  Psychiatric: Her behavior is normal. Judgment and thought content normal.  L achilles tender, swollen  Lab Results  Component Value Date   WBC 6.4 03/12/2017   HGB 12.2 03/12/2017   HCT 36.9 03/12/2017   PLT 248.0 03/12/2017   GLUCOSE 93 03/12/2017   CHOL 236 (H) 11/02/2016   TRIG 100.0 11/02/2016   HDL 60.60 11/02/2016   LDLDIRECT 188.2 01/10/2012   LDLCALC 155 (H) 11/02/2016   ALT 8 11/02/2016   AST 12 11/02/2016   NA 140 03/12/2017   K 4.3 03/12/2017   CL 105 03/12/2017   CREATININE 1.13 03/12/2017   BUN 20 03/12/2017   CO2 30 03/12/2017   TSH 2.14 11/02/2016   INR 1.09 08/22/2009    Dg Shoulder Right  Result Date: 02/23/2017 CLINICAL DATA:  Shoulder pain for 2 months.  No known injury. EXAM: RIGHT SHOULDER - 2+ VIEW COMPARISON:  None. FINDINGS: There is no evidence of fracture or dislocation. There is generalized osteopenia. There is no evidence of arthropathy or other focal bone abnormality. Soft tissues are unremarkable. IMPRESSION: No acute osseous injury of the right shoulder. Electronically Signed   By: Elige KoHetal  Patel   On: 02/23/2017 09:35    Assessment & Plan:   There are no diagnoses linked to this encounter.   No orders of the defined types were placed in this encounter.    Follow-up: No follow-ups on file.  Sonda PrimesAlex Plotnikov, MD

## 2017-11-27 NOTE — Assessment & Plan Note (Signed)
Medrol dose-pack prn

## 2017-11-27 NOTE — Assessment & Plan Note (Signed)
Toprol, Nifedipine 

## 2017-11-27 NOTE — Assessment & Plan Note (Signed)
Lorazepam prn, Doxepine at hs 

## 2017-11-27 NOTE — Assessment & Plan Note (Signed)
Levothroid 

## 2017-12-31 ENCOUNTER — Ambulatory Visit: Payer: Medicare Other | Admitting: Internal Medicine

## 2018-01-06 ENCOUNTER — Ambulatory Visit: Payer: Medicare Other | Admitting: Cardiovascular Disease

## 2018-01-06 ENCOUNTER — Encounter: Payer: Self-pay | Admitting: Cardiovascular Disease

## 2018-01-06 VITALS — BP 150/84 | HR 74 | Ht 68.0 in | Wt 166.6 lb

## 2018-01-06 DIAGNOSIS — I25119 Atherosclerotic heart disease of native coronary artery with unspecified angina pectoris: Secondary | ICD-10-CM | POA: Diagnosis not present

## 2018-01-06 DIAGNOSIS — E785 Hyperlipidemia, unspecified: Secondary | ICD-10-CM | POA: Diagnosis not present

## 2018-01-06 DIAGNOSIS — I10 Essential (primary) hypertension: Secondary | ICD-10-CM | POA: Diagnosis not present

## 2018-01-06 NOTE — Progress Notes (Signed)
Cardiology Office Note Date:  01/06/2018   ID:  [name], [name] January 14, 1946, MRN [id number]  PCP:  Tonny Bollman, MD  Cardiologist:  Tonny Bollman, MD    Chief Complaint  Patient presents with  . Chest Pain   History of Present Illness: [name] is a 72 y.o. female who presents for follow-up of coronary artery disease.  Other cardiovascular problems include hypertension and hyperlipidemia.  The patient underwent remote PCI of the LAD, with last catheterization in 2011 demonstrating patency of her stent site and no other significant coronary blockages.  She is been intolerant to all lipid-lowering therapies.  She has a long history of whitecoat hypertension.  She's here alone today. Overall doing pretty well. 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 Today, she denies symptoms of palpitations, shortness of breath, orthopnea, PND, lower extremity edema, dizziness, or syncope. She had one episode of chest pain at work associated with the stress of having a lot of customers in line. This occurred several months ago and she took a NTG with relief after 30-40 minutes. She's had no other chest pain or exertional symptoms. She complains of pain in the back of her neck, feels like a muscle cramp.  Otherwise reports no problems.   Past Medical History:  Diagnosis Date  . Anxiety state, unspecified   . Bronchitis, acute   . Carotid bruit   . Coronary atherosclerosis of unspecified type of vessel, native or graft    multiple angioplasy procedures in 90's/early 2000's  . De Quervain's tenosynovitis   . Depressive disorder, not elsewhere classified   . Female bladder prolapse   . Lichen 2011   Vaginal  . Other and unspecified hyperlipidemia   . Unspecified essential hypertension   . Unspecified hypothyroidism     History reviewed. No pertinent surgical history.  Current Outpatient Medications  Medication Sig Dispense Refill  .  aspirin 81 MG EC tablet Take 81 mg by mouth daily.      . Cholecalciferol 1000 UNITS tablet Take 1,000 Units by mouth daily.      . clorazepate (TRANXENE) 15 MG tablet Take 1 tablet (15 mg total) by mouth 3 (three) times daily as needed. for anxiety 270 tablet 1  . doxepin (SINEQUAN) 50 MG capsule Take 3 capsules (150 mg total) by mouth at bedtime. 270 capsule 3  . estradiol cypionate (DEPO-ESTRADIOL) 5 MG/ML injection Use 0.3 ml every 18-25 days IM 5 mL 3  . furosemide (LASIX) 40 MG tablet Take 1 tablet (40 mg total) by mouth 2 (two) times daily. 180 tablet 3  . levothyroxine (SYNTHROID, LEVOTHROID) 125 MCG tablet Take 1 tablet (125 mcg total) by mouth daily. 90 tablet 3  . LORazepam (ATIVAN) 0.5 MG tablet TAKE 1 TABLET BY MOUTH AT BEDTIME 90 tablet 1  . metoprolol tartrate (LOPRESSOR) 50 MG tablet Take 1 tablet (50 mg total) by mouth 3 (three) times daily. 270 tablet 3  . NIFEdipine (PROCARDIA-XL/ADALAT-CC/NIFEDICAL-XL) 30 MG 24 hr tablet Take 1 tablet (30 mg total) by mouth daily. 90 tablet 3  . nitroGLYCERIN (NITROLINGUAL) 0.4 MG/SPRAY spray PLACE 1 SPRAY UNDER THE TONGUE EVERY 5 MINUTES AS NEEDED FOR CHEST PAIN. MAY REPEAT 3 TIMES 12 g 1  . potassium chloride SA (KLOR-CON M20) 20 MEQ tablet Take 1 tablet (20 mEq total) by mouth 2 (two) times daily. 180 tablet 3   Current Facility-Administered Medications  Medication Dose Route Frequency Provider Last Rate  Last Dose  . estradiol cypionate (DEPO-ESTRADIOL) 5 MG/ML injection 1.5 mg  1.5 mg Intramuscular Q30 days Plotnikov, Georgina QuintAleksei V, MD   1.5 mg at 11/27/17 1548    Allergies:   Epinephrine; Isosorbide mononitrate; Lidocaine; Morphine; Propoxyphene n-acetaminophen; Alprazolam; Atorvastatin; Codeine; Ezetimibe; Prilosec [omeprazole]; Rosuvastatin; Simvastatin; Allopurinol; Meloxicam; Tetracycline; Amoxicillin; Ciprofloxacin; and Diphenhydramine hcl   Social History:  The patient  reports that she quit smoking about 26 years ago. She has never  used smokeless tobacco. She reports that she does not drink alcohol or use drugs.   Family History:  The patient's family history includes Coronary artery disease in her other; Hypertension in her father, mother, and other.    ROS:  Please see the history of present illness.  Otherwise, review of systems is positive for neck pain.  All other systems are reviewed and negative.    PHYSICAL EXAM: VS:  BP (!) 150/84   Pulse 74   Ht 5\' 8"  (1.727 m)   Wt 166 lb 9.6 oz (75.6 kg)   SpO2 96%   BMI 25.33 kg/m  , BMI Body mass index is 25.33 kg/m. GEN: Well nourished, well developed, in no acute distress  HEENT: normal  Neck: no JVD, no masses. bilateral carotid bruits Cardiac: RRR without murmur or gallop      Respiratory:  clear to auscultation bilaterally, normal work of breathing GI: soft, nontender, nondistended, + BS MS: no deformity or atrophy  Ext: no pretibial edema, pedal pulses 2+= bilaterally Skin: warm and dry, no rash Neuro:  Strength and sensation are intact Psych: euthymic mood, full affect  EKG:  EKG is ordered today. The ekg ordered today shows NSR 74 bpm, within normal limits  Recent Labs: 03/12/2017: BUN 20; Creatinine, Ser 1.13; Hemoglobin 12.2; Platelets 248.0; Potassium 4.3; Sodium 140   Lipid Panel     Component Value Date/Time   CHOL 236 (H) 11/02/2016 1229   TRIG 100.0 11/02/2016 1229   HDL 60.60 11/02/2016 1229   CHOLHDL 4 11/02/2016 1229   VLDL 20.0 11/02/2016 1229   LDLCALC 155 (H) 11/02/2016 1229   LDLDIRECT 188.2 01/10/2012 1135      Wt Readings from Last 3 Encounters:  01/06/18 166 lb 9.6 oz (75.6 kg)  11/27/17 165 lb (74.8 kg)  10/29/17 167 lb (75.8 kg)     Cardiac Studies Reviewed: Echo 11-16-2016: Study Conclusions  - Left ventricle: The cavity size was normal. Wall thickness was   normal. Systolic function was normal. The estimated ejection   fraction was in the range of 55% to 60%. Wall motion was normal;   there were no regional  wall motion abnormalities. Doppler   parameters are consistent with abnormal left ventricular   relaxation (grade 1 diastolic dysfunction). Doppler parameters   are consistent with high ventricular filling pressure. - Pulmonary arteries: Systolic pressure was mildly increased.  Impressions:  - Normal LV systolic function; mild diastolic dysfunction with   elevated LV filling pressure; mild TR with mildly elevated   pulmonary pressure.  ASSESSMENT AND PLAN: 1.  Coronary artery disease, native vessel, with angina: appears to be doing well on ASA and a beta blocker. One episode of angina reported and this was associated with stress. Continue same Rx.   2.  Hypertension: BP controlled on home readings. Longstanding white coat HTN and multiple medical intolerances noted.   3.  Hyperlipidemia: Treated with lifestyle modification.  Intolerant to statin and non-statin drugs. Lengthy discussion about PCSK9 inhibitors. She requests information but unlikely to  pursue Rx. I reviewed her most recent labs with her.   Current medicines are reviewed with the patient today.  The patient does not have concerns regarding medicines.  Labs/ tests ordered today include:   Orders Placed This Encounter  Procedures  . EKG 12-Lead   Disposition:   FU one year  Signed, Tonny BollmanMichael Cooper, MD  01/06/2018 4:57 PM    [facility] [address], [place], [place]  [address] Phone: [phone]; Fax: [phone]

## 2018-01-06 NOTE — Patient Instructions (Signed)
Medication Instructions:  Your provider recommends that you continue on your current medications as directed. Please refer to the Current Medication list given to you today.    Labwork: None  Testing/Procedures: None  Follow-Up: Your provider wants you to follow-up in: 1 year with Dr. Excell Seltzerooper. You will receive a reminder letter in the mail two months in advance. If you don't receive a letter, please call our office to schedule the follow-up appointment.    Any Other Special Instructions Will Be Listed Below (If Applicable). Please call us if you are interested in the medications below to lower your cholesterol:  Evolocumab injection What is this medicine? EVOLOCUMAB (e voe LOK ue mab) is known as a PCSK9 inhibitor. It is used to lower the level of cholesterol in the blood. It may be used alone or in combination with other cholesterol-lowering drugs. This drug may also be used to reduce the risk of heart attack, stroke, and certain types of heart surgery in patients with heart disease. This medicine may be used for other purposes; ask your health care provider or pharmacist if you have questions. COMMON BRAND NAME(S): REPATHA What should I tell my health care provider before I take this medicine? They need to know if you have any of these conditions: -an unusual or allergic reaction to evolocumab, other medicines, foods, dyes, or preservatives -pregnant or trying to get pregnant -breast-feeding How should I use this medicine? This medicine is for injection under the skin. You will be taught how to prepare and give this medicine. Use exactly as directed. Take your medicine at regular intervals. Do not take your medicine more often than directed. It is important that you put your used needles and syringes in a special sharps container. Do not put them in a trash can. If you do not have a sharps container, call your pharmacist or health care provider to get one. Talk to your pediatrician  regarding the use of this medicine in children. While this drug may be prescribed for children as young as 13 years for selected conditions, precautions do apply. Overdosage: If you think you have taken too much of this medicine contact a poison control center or emergency room at once. NOTE: This medicine is only for you. Do not share this medicine with others. What if I miss a dose? If you miss a dose, take it as soon as you can if there are more than 7 days until the next scheduled dose, or skip the missed dose and take the next dose according to your original schedule. Do not take double or extra doses. What may interact with this medicine? Interactions are not expected. This list may not describe all possible interactions. Give your health care provider a list of all the medicines, herbs, non-prescription drugs, or dietary supplements you use. Also tell them if you smoke, drink alcohol, or use illegal drugs. Some items may interact with your medicine. What should I watch for while using this medicine? You may need blood work while you are taking this medicine. What side effects may I notice from receiving this medicine? Side effects that you should report to your doctor or health care professional as soon as possible: -allergic reactions like skin rash, itching or hives, swelling of the face, lips, or tongue -signs and symptoms of infection like fever or chills; cough; sore throat; pain or trouble passing urine Side effects that usually do not require medical attention (report to your doctor or health care professional if they continue  or are bothersome): -diarrhea -nausea -muscle pain -pain, redness, or irritation at site where injected This list may not describe all possible side effects. Call your doctor for medical advice about side effects. You may report side effects to FDA at 1-800-FDA-1088. Where should I keep my medicine? Keep out of the reach of children. You will be instructed  on how to store this medicine. Throw away any unused medicine after the expiration date on the label. NOTE: This sheet is a summary. It may not cover all possible information. If you have questions about this medicine, talk to your doctor, pharmacist, or health care provider.  2018 Elsevier/Gold Standard (2016-04-23 13:21:53)   Alirocumab injection What is this medicine? ALIROCUMAB (al i ROC ue mab) is known as a PCSK9 inhibitor. It is used to lower the level of cholesterol in the blood. This medicine is only for patients whose cholesterol is not controlled by diet and statin therapy. This medicine may be used for other purposes; ask your health care provider or pharmacist if you have questions. COMMON BRAND NAME(S): Praluent What should I tell my health care provider before I take this medicine? They need to know if you have any of these conditions: -any unusual or allergic reaction to alirocumab, other medicines, foods, dyes, or preservatives -pregnant or trying to get pregnant -breast-feeding How should I use this medicine? This medicine is for injection under the skin. You will be taught how to prepare and give this medicine. Use exactly as directed. Take your medicine at regular intervals. Do not take your medicine more often than directed. It is important that you put your used needles and syringes in a special sharps container. Do not put them in a trash can. If you do not have a sharps container, call your pharmacist or healthcare provider to get one. Talk to your pediatrician regarding the use of this medicine in children. Special care may be needed. Overdosage: If you think you have taken too much of this medicine contact a poison control center or emergency room at once. NOTE: This medicine is only for you. Do not share this medicine with others. What if I miss a dose? If you are on an every 2 week schedule and miss a dose, take it as soon as you can. If your next dose is to be  taken in less than 7 days, then do not take the missed dose. Take the next dose at your regular time. Do not take double or extra doses. If you are on a monthly schedule and miss a dose, take it as soon as you can. If the dose given is within 7 days of the missed dose, continue with your regular monthly schedule. If the missed dose is administered after 7 days of the original date, administer the dose and start a new monthly schedule based on this date. What may interact with this medicine? Interactions are not expected. This list may not describe all possible interactions. Give your health care provider a list of all the medicines, herbs, non-prescription drugs, or dietary supplements you use. Also tell them if you smoke, drink alcohol, or use illegal drugs. Some items may interact with your medicine. What should I watch for while using this medicine? You may need blood work done while you are taking this medicine. What side effects may I notice from receiving this medicine? Side effects that you should report to your doctor or health care professional as soon as possible: -allergic reactions like skin rash, itching  or hives, swelling of the face, lips, or tongue -signs and symptoms of infection like fever or chills; cough; sore throat; pain or trouble passing urine -signs and symptoms of liver injury like dark yellow or brown urine; general ill feeling or flu-like symptoms; light-colored stools; loss of appetite; nausea; right upper belly pain; unusually weak or tired; yellowing of the eyes or skin Side effects that usually do not require medical attention (report to your doctor or health care professional if they continue or are bothersome): -diarrhea -muscle cramps -muscle pain -pain, redness, or irritation at site where injected This list may not describe all possible side effects. Call your doctor for medical advice about side effects. You may report side effects to FDA at  1-800-FDA-1088. Where should I keep my medicine? Keep out of the reach of children. You will be instructed on how to store this medicine. Throw away any unused medicine after the expiration date on the label. NOTE: This sheet is a summary. It may not cover all possible information. If you have questions about this medicine, talk to your doctor, pharmacist, or health care provider.  2018 Elsevier/Gold Standard (2015-09-14 15:09:06)

## 2018-01-07 ENCOUNTER — Encounter: Payer: Self-pay | Admitting: Internal Medicine

## 2018-01-07 ENCOUNTER — Ambulatory Visit (INDEPENDENT_AMBULATORY_CARE_PROVIDER_SITE_OTHER): Payer: Medicare Other | Admitting: Internal Medicine

## 2018-01-07 DIAGNOSIS — I10 Essential (primary) hypertension: Secondary | ICD-10-CM

## 2018-01-07 DIAGNOSIS — N951 Menopausal and female climacteric states: Secondary | ICD-10-CM

## 2018-01-07 DIAGNOSIS — M5481 Occipital neuralgia: Secondary | ICD-10-CM | POA: Insufficient documentation

## 2018-01-07 DIAGNOSIS — M10011 Idiopathic gout, right shoulder: Secondary | ICD-10-CM

## 2018-01-07 MED ORDER — METHYLPREDNISOLONE 4 MG PO TBPK: ORAL_TABLET | ORAL | 0 refills | Status: DC

## 2018-01-07 MED ORDER — METHYLPREDNISOLONE ACETATE 40 MG/ML IJ SUSP
20.0000 mg | Freq: Once | INTRAMUSCULAR | Status: AC
  Administered 2018-01-07: 20 mg via INTRA_ARTICULAR

## 2018-01-07 NOTE — Progress Notes (Signed)
Subjective:  Patient ID: [name] StallRuby C Eccleston, female    DOB: 10/08/1945  Age: 72 y.o. MRN: [id number]  CC: No chief complaint on file.   HPI [name] presents for neck pain on the L - worse, HTN, HRT f/u  Outpatient Medications Prior to Visit  Medication Sig Dispense Refill  . aspirin 81 MG EC tablet Take 81 mg by mouth daily.      . Cholecalciferol 1000 UNITS tablet Take 1,000 Units by mouth daily.      . clorazepate (TRANXENE) 15 MG tablet Take 1 tablet (15 mg total) by mouth 3 (three) times daily as needed. for anxiety 270 tablet 1  . doxepin (SINEQUAN) 50 MG capsule Take 3 capsules (150 mg total) by mouth at bedtime. 270 capsule 3  . estradiol cypionate (DEPO-ESTRADIOL) 5 MG/ML injection Use 0.3 ml every 18-25 days IM 5 mL 3  . furosemide (LASIX) 40 MG tablet Take 1 tablet (40 mg total) by mouth 2 (two) times daily. 180 tablet 3  . levothyroxine (SYNTHROID, LEVOTHROID) 125 MCG tablet Take 1 tablet (125 mcg total) by mouth daily. 90 tablet 3  . LORazepam (ATIVAN) 0.5 MG tablet TAKE 1 TABLET BY MOUTH AT BEDTIME 90 tablet 1  . metoprolol tartrate (LOPRESSOR) 50 MG tablet Take 1 tablet (50 mg total) by mouth 3 (three) times daily. 270 tablet 3  . NIFEdipine (PROCARDIA-XL/ADALAT-CC/NIFEDICAL-XL) 30 MG 24 hr tablet Take 1 tablet (30 mg total) by mouth daily. 90 tablet 3  . nitroGLYCERIN (NITROLINGUAL) 0.4 MG/SPRAY spray PLACE 1 SPRAY UNDER THE TONGUE EVERY 5 MINUTES AS NEEDED FOR CHEST PAIN. MAY REPEAT 3 TIMES 12 g 1  . potassium chloride SA (KLOR-CON M20) 20 MEQ tablet Take 1 tablet (20 mEq total) by mouth 2 (two) times daily. 180 tablet 3   Facility-Administered Medications Prior to Visit  Medication Dose Route Frequency Provider Last Rate Last Dose  . estradiol cypionate (DEPO-ESTRADIOL) 5 MG/ML injection 1.5 mg  1.5 mg Intramuscular Q30 days Plotnikov, Georgina QuintAleksei V, MD   1.5 mg at 11/27/17 1548    ROS: Review of Systems  Constitutional: Positive for fatigue. Negative for activity change,  appetite change, chills and unexpected weight change.  HENT: Negative for congestion, mouth sores and sinus pressure.   Eyes: Negative for visual disturbance.  Respiratory: Negative for cough and chest tightness.   Gastrointestinal: Negative for abdominal pain and nausea.  Genitourinary: Negative for difficulty urinating, frequency and vaginal pain.  Musculoskeletal: Positive for neck pain and neck stiffness. Negative for back pain and gait problem.  Skin: Negative for pallor and rash.  Neurological: Negative for dizziness, tremors, weakness, numbness and headaches.  Psychiatric/Behavioral: Negative for confusion, sleep disturbance and suicidal ideas. The patient is nervous/anxious.     Objective:  BP 130/82 (BP Location: Left Arm, Patient Position: Sitting, Cuff Size: Normal)   Pulse (!) 57   Temp 98.2 F (36.8 C) (Oral)   Ht 5\' 8"  (1.727 m)   Wt 167 lb (75.8 kg)   SpO2 96%   BMI 25.39 kg/m   BP Readings from Last 3 Encounters:  01/07/18 130/82  01/06/18 (!) 150/84  11/27/17 (!) 152/86    Wt Readings from Last 3 Encounters:  01/07/18 167 lb (75.8 kg)  01/06/18 166 lb 9.6 oz (75.6 kg)  11/27/17 165 lb (74.8 kg)    Physical Exam  Constitutional: She appears well-developed. No distress.  HENT:  Head: Normocephalic.  Right Ear: External ear normal.  Left Ear: External ear normal.  Nose: Nose normal.  Mouth/Throat: Oropharynx is clear and moist.  Eyes: Pupils are equal, round, and reactive to light. Conjunctivae are normal. Right eye exhibits no discharge. Left eye exhibits no discharge.  Neck: Normal range of motion. Neck supple. No JVD present. No tracheal deviation present. No thyromegaly present.  Cardiovascular: Normal rate, regular rhythm and normal heart sounds.  Pulmonary/Chest: No stridor. No respiratory distress. She has no wheezes.  Abdominal: Soft. Bowel sounds are normal. She exhibits no distension and no mass. There is no tenderness. There is no rebound and no  guarding.  Musculoskeletal: She exhibits tenderness. She exhibits no edema.  Lymphadenopathy:    She has no cervical adenopathy.  Neurological: She displays normal reflexes. No cranial nerve deficit. She exhibits normal muscle tone. Coordination normal.  Skin: No rash noted. No erythema.  Psychiatric: She has a normal mood and affect. Her behavior is normal. Judgment and thought content normal.    Procedure Note :     occipital nerve Injection:   Indication :  occipital neuralgia L   Risks including unsuccessful procedure , bleeding, infection, bruising, skin atrophy and others were explained to the patient in detail as well as the benefits. Informed consent was obtained and signed.   Tthe patient was placed in a comfortable position. L  occip nerve exit point was marked and  the skin was prepped with Betadine and alcohol. 11/2 inch 25-gauge needle was used. The needle was advanced perpendicular to the skin and I injected the site with 1 mL of 2% lidocaine and 20 mg of Depo-Medrol in a usual fashion.     Tolerated well. Complications: None. Good pain relief following the procedure.  Lab Results  Component Value Date   WBC 6.4 03/12/2017   HGB 12.2 03/12/2017   HCT 36.9 03/12/2017   PLT 248.0 03/12/2017   GLUCOSE 93 03/12/2017   CHOL 236 (H) 11/02/2016   TRIG 100.0 11/02/2016   HDL 60.60 11/02/2016   LDLDIRECT 188.2 01/10/2012   LDLCALC 155 (H) 11/02/2016   ALT 8 11/02/2016   AST 12 11/02/2016   NA 140 03/12/2017   K 4.3 03/12/2017   CL 105 03/12/2017   CREATININE 1.13 03/12/2017   BUN 20 03/12/2017   CO2 30 03/12/2017   TSH 2.14 11/02/2016   INR 1.09 08/22/2009    Dg Shoulder Right  Result Date: 02/23/2017 CLINICAL DATA:  Shoulder pain for 2 months.  No known injury. EXAM: RIGHT SHOULDER - 2+ VIEW COMPARISON:  None. FINDINGS: There is no evidence of fracture or dislocation. There is generalized osteopenia. There is no evidence of arthropathy or other focal bone  abnormality. Soft tissues are unremarkable. IMPRESSION: No acute osseous injury of the right shoulder. Electronically Signed   By: Elige KoHetal  Patel   On: 02/23/2017 09:35    Assessment & Plan:   There are no diagnoses linked to this encounter.   No orders of the defined types were placed in this encounter.    Follow-up: No follow-ups on file.  Sonda PrimesAlex Plotnikov, MD

## 2018-01-07 NOTE — Patient Instructions (Signed)
Occipital Neuralgia Occipital neuralgia is a type of headache that causes episodes of very bad pain in the back of your head. Pain from occipital neuralgia may spread (radiate) to other parts of your head. The pain is usually brief and often goes away after you rest and relax. These headaches may be caused by irritation of the nerves that leave your spinal cord high up in your neck, just below the base of your skull (occipital nerves). Your occipital nerves transmit sensations from the back of your head, the top of your head, and the areas behind your ears. What are the causes? Occipital neuralgia can occur without any known cause (primary headache syndrome). In other cases, occipital neuralgia is caused by pressure on or irritation of one of the two occipital nerves. Causes of occipital nerve compression or irritation include:  Wear and tear of the vertebrae in the neck (osteoarthritis).  Neck injury.  Disease of the disks that separate the vertebrae.  Tumors.  Gout.  Infections.  Diabetes.  Swollen blood vessels that put pressure on the occipital nerves.  Muscle spasm in the neck.  What are the signs or symptoms? Pain is the main symptom of occipital neuralgia. It usually starts in the back of the head but may also be felt in other areas supplied by the occipital nerves. Pain is usually on one side but may be on both sides. You may have:  Brief episodes of very bad pain that is burning, stabbing, shocking, or shooting.  Pain behind the eye.  Pain triggered by neck movement or hair brushing.  Scalp tenderness.  Aching in the back of the head between episodes of very bad pain.  How is this diagnosed? Your health care provider may diagnose occipital neuralgia based on your symptoms and a physical exam. During the exam, the health care provider may push on areas supplied by the occipital nerves to see if they are painful. Some tests may also be done to help in making the  diagnosis. These may include:  Imaging studies of the upper spinal cord, such as an MRI or CT scan. These may show compression or spinal cord abnormalities.  Nerve block. You will get an injection of numbing medicine (local anesthetic) near the occipital nerve to see if this relieves pain.  How is this treated? Treatment may begin with simple measures, such as:  Rest.  Massage.  Heat.  Over-the-counter pain relievers.  If these measures do not work, you may need other treatments, including:  Medicines such as: ? Prescription-strength anti-inflammatory medicines. ? Muscle relaxants. ? Antiseizure medicines. ? Antidepressants.  Steroid injection. This involves injections of local anesthetic and strong anti-inflammatory drugs (steroids).  Pulsed radiofrequency. Wires are implanted to deliver electrical impulses that block pain signals from the occipital nerve.  Physical therapy.  Surgery to relieve nerve pressure.  Follow these instructions at home:  Take all medicines as directed by your health care provider.  Avoid activities that cause pain.  Rest when you have an attack of pain.  Try gentle massage or a heating pad to relieve pain.  Work with a physical therapist to learn stretching exercises you can do at home.  Try a different pillow or sleeping position.  Practice good posture.  Try to stay active. Get regular exercise that does not cause pain. Ask your health care provider to suggest safe exercises for you.  Keep all follow-up visits as directed by your health care provider. This is important. Contact a health care provider if:    Your medicine is not working.  You have new or worsening symptoms. Get help right away if:  You have very bad head pain that is not going away.  You have a sudden change in vision, balance, or speech. This information is not intended to replace advice given to you by your health care provider. Make sure you discuss any  questions you have with your health care provider. Document Released: 05/01/2001 Document Revised: 10/13/2015 Document Reviewed: 04/29/2013 Elsevier Interactive Patient Education  2017 [employer]  

## 2018-01-07 NOTE — Addendum Note (Signed)
Addended by: Scarlett PrestoFRIEDENBACH, BETTY on: 01/07/2018 08:39 AM   Modules accepted: Orders

## 2018-01-07 NOTE — Assessment & Plan Note (Signed)
Toprol, Nifedipine 

## 2018-01-07 NOTE — Assessment & Plan Note (Signed)
She would like to cont Depo-Estra 0.3 ml  Potential benefits of a long term HRT  use as well as potential risks  and complications were explained to the patient and were aknowledged.

## 2018-01-07 NOTE — Assessment & Plan Note (Addendum)
Options discussed incl a nerve block Medrol dose pack  Potential benefits of a repeat steroid  use as well as potential risks  and complications were explained to the patient and were aknowledged.

## 2018-01-07 NOTE — Assessment & Plan Note (Signed)
Allopurinol  

## 2018-02-05 ENCOUNTER — Ambulatory Visit (INDEPENDENT_AMBULATORY_CARE_PROVIDER_SITE_OTHER): Payer: Medicare Other | Admitting: Internal Medicine

## 2018-02-05 ENCOUNTER — Encounter: Payer: Self-pay | Admitting: Internal Medicine

## 2018-02-05 VITALS — BP 138/78 | HR 65 | Temp 97.7°F | Resp 16 | Ht 68.0 in | Wt 169.0 lb

## 2018-02-05 DIAGNOSIS — M5481 Occipital neuralgia: Secondary | ICD-10-CM

## 2018-02-05 DIAGNOSIS — Z79890 Hormone replacement therapy (postmenopausal): Secondary | ICD-10-CM | POA: Diagnosis not present

## 2018-02-05 DIAGNOSIS — N951 Menopausal and female climacteric states: Secondary | ICD-10-CM | POA: Diagnosis not present

## 2018-02-05 DIAGNOSIS — M10011 Idiopathic gout, right shoulder: Secondary | ICD-10-CM | POA: Diagnosis not present

## 2018-02-05 DIAGNOSIS — I10 Essential (primary) hypertension: Secondary | ICD-10-CM

## 2018-02-05 DIAGNOSIS — E034 Atrophy of thyroid (acquired): Secondary | ICD-10-CM

## 2018-02-05 DIAGNOSIS — M436 Torticollis: Secondary | ICD-10-CM

## 2018-02-05 MED ORDER — ESTRADIOL CYPIONATE 5 MG/ML IM OIL
1.5000 mg | TOPICAL_OIL | Freq: Once | INTRAMUSCULAR | Status: AC
  Administered 2018-02-05: 1.5 mg via INTRAMUSCULAR

## 2018-02-05 NOTE — Assessment & Plan Note (Signed)
Depo-Estradiol 0.3 ml IM 

## 2018-02-05 NOTE — Assessment & Plan Note (Signed)
Occip nerve block helped

## 2018-02-05 NOTE — Assessment & Plan Note (Signed)
Occip nerve block helped 

## 2018-02-05 NOTE — Assessment & Plan Note (Signed)
Allopurinol In remission

## 2018-02-05 NOTE — Assessment & Plan Note (Signed)
Levothroid 

## 2018-02-05 NOTE — Addendum Note (Signed)
Addended by: Zenovia JordanMITCHELL, TAYLOR B on: 02/05/2018 09:08 AM   Modules accepted: Orders

## 2018-02-05 NOTE — Assessment & Plan Note (Signed)
Toprol, Nifedipine 

## 2018-02-05 NOTE — Progress Notes (Signed)
Subjective:  Patient ID: [name], female    DOB: Dec 07, 1945  Age: 72 y.o. MRN: [id number]  CC: No chief complaint on file.   HPI LAASIA ARCOS presents for occip neuralgia, leg edema - both better F/u HTN, arthralgias  Outpatient Medications Prior to Visit  Medication Sig Dispense Refill  . aspirin 81 MG EC tablet Take 81 mg by mouth daily.      . Cholecalciferol 1000 UNITS tablet Take 1,000 Units by mouth daily.      . clorazepate (TRANXENE) 15 MG tablet Take 1 tablet (15 mg total) by mouth 3 (three) times daily as needed. for anxiety 270 tablet 1  . doxepin (SINEQUAN) 50 MG capsule Take 3 capsules (150 mg total) by mouth at bedtime. 270 capsule 3  . estradiol cypionate (DEPO-ESTRADIOL) 5 MG/ML injection Use 0.3 ml every 18-25 days IM 5 mL 3  . furosemide (LASIX) 40 MG tablet Take 1 tablet (40 mg total) by mouth 2 (two) times daily. 180 tablet 3  . levothyroxine (SYNTHROID, LEVOTHROID) 125 MCG tablet Take 1 tablet (125 mcg total) by mouth daily. 90 tablet 3  . LORazepam (ATIVAN) 0.5 MG tablet TAKE 1 TABLET BY MOUTH AT BEDTIME 90 tablet 1  . methylPREDNISolone (MEDROL DOSEPAK) 4 MG TBPK tablet As directed 21 tablet 0  . metoprolol tartrate (LOPRESSOR) 50 MG tablet Take 1 tablet (50 mg total) by mouth 3 (three) times daily. 270 tablet 3  . NIFEdipine (PROCARDIA-XL/ADALAT-CC/NIFEDICAL-XL) 30 MG 24 hr tablet Take 1 tablet (30 mg total) by mouth daily. 90 tablet 3  . nitroGLYCERIN (NITROLINGUAL) 0.4 MG/SPRAY spray PLACE 1 SPRAY UNDER THE TONGUE EVERY 5 MINUTES AS NEEDED FOR CHEST PAIN. MAY REPEAT 3 TIMES 12 g 1  . potassium chloride SA (KLOR-CON M20) 20 MEQ tablet Take 1 tablet (20 mEq total) by mouth 2 (two) times daily. 180 tablet 3   Facility-Administered Medications Prior to Visit  Medication Dose Route Frequency Provider Last Rate Last Dose  . estradiol cypionate (DEPO-ESTRADIOL) 5 MG/ML injection 1.5 mg  1.5 mg Intramuscular Q30 days , Georgina Quint, MD   1.5 mg at 01/07/18  0839    ROS: Review of Systems  Constitutional: Negative for activity change, appetite change, chills, fatigue and unexpected weight change.  HENT: Negative for congestion, mouth sores and sinus pressure.   Eyes: Negative for visual disturbance.  Respiratory: Negative for cough and chest tightness.   Gastrointestinal: Negative for abdominal pain and nausea.  Genitourinary: Negative for difficulty urinating, frequency and vaginal pain.  Musculoskeletal: Positive for arthralgias, back pain and gait problem.  Skin: Negative for pallor and rash.  Neurological: Negative for dizziness, tremors, weakness, numbness and headaches.  Psychiatric/Behavioral: Positive for dysphoric mood. Negative for confusion, sleep disturbance and suicidal ideas. The patient is nervous/anxious.     Objective:  BP 138/78   Pulse 65   Temp 97.7 F (36.5 C) (Oral)   Resp 16   Ht 5\' 8"  (1.727 m)   Wt 169 lb (76.7 kg)   SpO2 97%   BMI 25.70 kg/m   BP Readings from Last 3 Encounters:  02/05/18 138/78  01/07/18 130/82  01/06/18 (!) 150/84    Wt Readings from Last 3 Encounters:  02/05/18 169 lb (76.7 kg)  01/07/18 167 lb (75.8 kg)  01/06/18 166 lb 9.6 oz (75.6 kg)    Physical Exam  Constitutional: She appears well-developed. No distress.  HENT:  Head: Normocephalic.  Right Ear: External ear normal.  Left Ear: External ear  normal.  Nose: Nose normal.  Mouth/Throat: Oropharynx is clear and moist.  Eyes: Pupils are equal, round, and reactive to light. Conjunctivae are normal. Right eye exhibits no discharge. Left eye exhibits no discharge.  Neck: Normal range of motion. Neck supple. No JVD present. No tracheal deviation present. No thyromegaly present.  Cardiovascular: Normal rate, regular rhythm and normal heart sounds.  Pulmonary/Chest: No stridor. No respiratory distress. She has no wheezes.  Abdominal: Soft. Bowel sounds are normal. She exhibits no distension and no mass. There is no tenderness.  There is no rebound and no guarding.  Musculoskeletal: She exhibits tenderness. She exhibits no edema.  Lymphadenopathy:    She has no cervical adenopathy.  Neurological: She displays normal reflexes. No cranial nerve deficit. She exhibits normal muscle tone. Coordination normal.  Skin: No rash noted. No erythema.  Psychiatric: She has a normal mood and affect. Her behavior is normal. Judgment and thought content normal.  shoulders tender  Lab Results  Component Value Date   WBC 6.4 03/12/2017   HGB 12.2 03/12/2017   HCT 36.9 03/12/2017   PLT 248.0 03/12/2017   GLUCOSE 93 03/12/2017   CHOL 236 (H) 11/02/2016   TRIG 100.0 11/02/2016   HDL 60.60 11/02/2016   LDLDIRECT 188.2 01/10/2012   LDLCALC 155 (H) 11/02/2016   ALT 8 11/02/2016   AST 12 11/02/2016   NA 140 03/12/2017   K 4.3 03/12/2017   CL 105 03/12/2017   CREATININE 1.13 03/12/2017   BUN 20 03/12/2017   CO2 30 03/12/2017   TSH 2.14 11/02/2016   INR 1.09 08/22/2009    Dg Shoulder Right  Result Date: 02/23/2017 CLINICAL DATA:  Shoulder pain for 2 months.  No known injury. EXAM: RIGHT SHOULDER - 2+ VIEW COMPARISON:  None. FINDINGS: There is no evidence of fracture or dislocation. There is generalized osteopenia. There is no evidence of arthropathy or other focal bone abnormality. Soft tissues are unremarkable. IMPRESSION: No acute osseous injury of the right shoulder. Electronically Signed   By: Elige KoHetal  Patel   On: 02/23/2017 09:35    Assessment & Plan:   There are no diagnoses linked to this encounter.   No orders of the defined types were placed in this encounter.    Follow-up: No follow-ups on file.  Sonda PrimesAlex , MD

## 2018-02-20 ENCOUNTER — Encounter: Payer: Self-pay | Admitting: Internal Medicine

## 2018-02-20 ENCOUNTER — Ambulatory Visit (INDEPENDENT_AMBULATORY_CARE_PROVIDER_SITE_OTHER): Payer: Medicare Other | Admitting: Internal Medicine

## 2018-02-20 DIAGNOSIS — B029 Zoster without complications: Secondary | ICD-10-CM

## 2018-02-20 MED ORDER — VALACYCLOVIR HCL 1 G PO TABS: 1000.0000 mg | ORAL_TABLET | Freq: Three times a day (TID) | ORAL | 0 refills | Status: DC

## 2018-02-20 NOTE — Patient Instructions (Signed)
Shingles Shingles, which is also known as herpes zoster, is an infection that causes a painful skin rash and fluid-filled blisters. Shingles is not related to genital herpes, which is a sexually transmitted infection. Shingles only develops in people who:  Have had chickenpox.  Have received the chickenpox vaccine. (This is rare.)  What are the causes? Shingles is caused by varicella-zoster virus (VZV). This is the same virus that causes chickenpox. After exposure to VZV, the virus stays in the body in an inactive (dormant) state. Shingles develops if the virus reactivates. This can happen many years after the initial exposure to VZV. It is not known what causes this virus to reactivate. What increases the risk? People who have had chickenpox or received the chickenpox vaccine are at risk for shingles. Infection is more common in people who:  Are older than age 50.  Have a weakened defense (immune) system, such as those with HIV, AIDS, or cancer.  Are taking medicines that weaken the immune system, such as transplant medicines.  Are under great stress.  What are the signs or symptoms? Early symptoms of this condition include itching, tingling, and pain in an area on your skin. Pain may be described as burning, stabbing, or throbbing. A few days or weeks after symptoms start, a painful red rash appears, usually on one side of the body in a bandlike or beltlike pattern. The rash eventually turns into fluid-filled blisters that break open, scab over, and dry up in about 2-3 weeks. At any time during the infection, you may also develop:  A fever.  Chills.  A headache.  An upset stomach.  How is this diagnosed? This condition is diagnosed with a skin exam. Sometimes, skin or fluid samples are taken from the blisters before a diagnosis is made. These samples are examined under a microscope or sent to a lab for testing. How is this treated? There is no specific cure for this condition.  Your health care provider will probably prescribe medicines to help you manage pain, recover more quickly, and avoid long-term problems. Medicines may include:  Antiviral drugs.  Anti-inflammatory drugs.  Pain medicines.  If the area involved is on your face, you may be referred to a specialist, such as an eye doctor (ophthalmologist) or an ear, nose, and throat (ENT) doctor to help you avoid eye problems, chronic pain, or disability. Follow these instructions at home: Medicines  Take medicines only as directed by your health care provider.  Apply an anti-itch or numbing cream to the affected area as directed by your health care provider. Blister and Rash Care  Take a cool bath or apply cool compresses to the area of the rash or blisters as directed by your health care provider. This may help with pain and itching.  Keep your rash covered with a loose bandage (dressing). Wear loose-fitting clothing to help ease the pain of material rubbing against the rash.  Keep your rash and blisters clean with mild soap and cool water or as directed by your health care provider.  Check your rash every day for signs of infection. These include redness, swelling, and pain that lasts or increases.  Do not pick your blisters.  Do not scratch your rash. General instructions  Rest as directed by your health care provider.  Keep all follow-up visits as directed by your health care provider. This is important.  Until your blisters scab over, your infection can cause chickenpox in people who have never had it or been vaccinated   against it. To prevent this from happening, avoid contact with other people, especially: ? Babies. ? Pregnant women. ? Children who have eczema. ? Elderly people who have transplants. ? People who have chronic illnesses, such as leukemia or AIDS. Contact a health care provider if:  Your pain is not relieved with prescribed medicines.  Your pain does not get better after  the rash heals.  Your rash looks infected. Signs of infection include redness, swelling, and pain that lasts or increases. Get help right away if:  The rash is on your face or nose.  You have facial pain, pain around your eye area, or loss of feeling on one side of your face.  You have ear pain or you have ringing in your ear.  You have loss of taste.  Your condition gets worse. This information is not intended to replace advice given to you by your health care provider. Make sure you discuss any questions you have with your health care provider. Document Released: 05/07/2005 Document Revised: 01/01/2016 Document Reviewed: 03/18/2014 Elsevier Interactive Patient Education  2018 [employer] Inc.  

## 2018-02-20 NOTE — Telephone Encounter (Signed)
Pt coming in today at 1120.

## 2018-02-20 NOTE — Progress Notes (Signed)
Subjective:  Patient ID: [name], female    DOB: 1946/02/26  Age: 72 y.o. MRN: [id number]  CC: No chief complaint on file.   HPI NARI VANNATTER presents for L neck pain x days, severe  Outpatient Medications Prior to Visit  Medication Sig Dispense Refill  . aspirin 81 MG EC tablet Take 81 mg by mouth daily.      . Cholecalciferol 1000 UNITS tablet Take 1,000 Units by mouth daily.      . clorazepate (TRANXENE) 15 MG tablet Take 1 tablet (15 mg total) by mouth 3 (three) times daily as needed. for anxiety 270 tablet 1  . doxepin (SINEQUAN) 50 MG capsule Take 3 capsules (150 mg total) by mouth at bedtime. 270 capsule 3  . estradiol cypionate (DEPO-ESTRADIOL) 5 MG/ML injection Use 0.3 ml every 18-25 days IM 5 mL 3  . furosemide (LASIX) 40 MG tablet Take 1 tablet (40 mg total) by mouth 2 (two) times daily. 180 tablet 3  . levothyroxine (SYNTHROID, LEVOTHROID) 125 MCG tablet Take 1 tablet (125 mcg total) by mouth daily. 90 tablet 3  . LORazepam (ATIVAN) 0.5 MG tablet TAKE 1 TABLET BY MOUTH AT BEDTIME 90 tablet 1  . methylPREDNISolone (MEDROL DOSEPAK) 4 MG TBPK tablet As directed 21 tablet 0  . metoprolol tartrate (LOPRESSOR) 50 MG tablet Take 1 tablet (50 mg total) by mouth 3 (three) times daily. 270 tablet 3  . NIFEdipine (PROCARDIA-XL/ADALAT-CC/NIFEDICAL-XL) 30 MG 24 hr tablet Take 1 tablet (30 mg total) by mouth daily. 90 tablet 3  . nitroGLYCERIN (NITROLINGUAL) 0.4 MG/SPRAY spray PLACE 1 SPRAY UNDER THE TONGUE EVERY 5 MINUTES AS NEEDED FOR CHEST PAIN. MAY REPEAT 3 TIMES 12 g 1  . potassium chloride SA (KLOR-CON M20) 20 MEQ tablet Take 1 tablet (20 mEq total) by mouth 2 (two) times daily. 180 tablet 3   Facility-Administered Medications Prior to Visit  Medication Dose Route Frequency Provider Last Rate Last Dose  . estradiol cypionate (DEPO-ESTRADIOL) 5 MG/ML injection 1.5 mg  1.5 mg Intramuscular Q30 days Plotnikov, Georgina Quint, MD   1.5 mg at 01/07/18 0839    ROS: Review of Systems    Constitutional: Negative for activity change, appetite change, chills, fatigue and unexpected weight change.  HENT: Negative for congestion, mouth sores and sinus pressure.   Eyes: Negative for visual disturbance.  Respiratory: Negative for cough and chest tightness.   Gastrointestinal: Negative for abdominal pain and nausea.  Genitourinary: Negative for difficulty urinating, frequency and vaginal pain.  Musculoskeletal: Positive for neck pain and neck stiffness. Negative for back pain and gait problem.  Skin: Negative for pallor and rash.  Neurological: Negative for dizziness, tremors, weakness, numbness and headaches.  Psychiatric/Behavioral: Negative for confusion and sleep disturbance.    Objective:  BP (!) 160/100 (BP Location: Left Arm, Patient Position: Sitting, Cuff Size: Normal)   Pulse 72   Temp 98.2 F (36.8 C) (Oral)   Ht 5\' 8"  (1.727 m)   Wt 168 lb (76.2 kg)   SpO2 96%   BMI 25.54 kg/m   BP Readings from Last 3 Encounters:  02/20/18 (!) 160/100  02/05/18 138/78  01/07/18 130/82    Wt Readings from Last 3 Encounters:  02/20/18 168 lb (76.2 kg)  02/05/18 169 lb (76.7 kg)  01/07/18 167 lb (75.8 kg)    Physical Exam  Constitutional: She appears well-developed. No distress.  HENT:  Head: Normocephalic.  Right Ear: External ear normal.  Left Ear: External ear normal.  Nose:  Nose normal.  Mouth/Throat: Oropharynx is clear and moist.  Eyes: Pupils are equal, round, and reactive to light. Conjunctivae are normal. Right eye exhibits no discharge. Left eye exhibits no discharge.  Neck: Normal range of motion. Neck supple. No JVD present. No tracheal deviation present. No thyromegaly present.  Cardiovascular: Normal rate, regular rhythm and normal heart sounds.  Pulmonary/Chest: No stridor. No respiratory distress. She has no wheezes.  Abdominal: Soft. Bowel sounds are normal. She exhibits no distension and no mass. There is no tenderness. There is no rebound and no  guarding.  Musculoskeletal: She exhibits no edema or tenderness.  Lymphadenopathy:    She has no cervical adenopathy.  Neurological: She displays normal reflexes. No cranial nerve deficit. She exhibits normal muscle tone. Coordination normal.  Skin: No rash noted. No erythema.  Psychiatric: She has a normal mood and affect. Her behavior is normal. Judgment and thought content normal.  eryth blisters patch L skull base posterior  Lab Results  Component Value Date   WBC 6.4 03/12/2017   HGB 12.2 03/12/2017   HCT 36.9 03/12/2017   PLT 248.0 03/12/2017   GLUCOSE 93 03/12/2017   CHOL 236 (H) 11/02/2016   TRIG 100.0 11/02/2016   HDL 60.60 11/02/2016   LDLDIRECT 188.2 01/10/2012   LDLCALC 155 (H) 11/02/2016   ALT 8 11/02/2016   AST 12 11/02/2016   NA 140 03/12/2017   K 4.3 03/12/2017   CL 105 03/12/2017   CREATININE 1.13 03/12/2017   BUN 20 03/12/2017   CO2 30 03/12/2017   TSH 2.14 11/02/2016   INR 1.09 08/22/2009    Dg Shoulder Right  Result Date: 02/23/2017 CLINICAL DATA:  Shoulder pain for 2 months.  No known injury. EXAM: RIGHT SHOULDER - 2+ VIEW COMPARISON:  None. FINDINGS: There is no evidence of fracture or dislocation. There is generalized osteopenia. There is no evidence of arthropathy or other focal bone abnormality. Soft tissues are unremarkable. IMPRESSION: No acute osseous injury of the right shoulder. Electronically Signed   By: Elige Ko   On: 02/23/2017 09:35    Assessment & Plan:   There are no diagnoses linked to this encounter.   No orders of the defined types were placed in this encounter.    Follow-up: No follow-ups on file.  Sonda Primes, MD

## 2018-02-20 NOTE — Assessment & Plan Note (Signed)
L neck/scalp Valtrex po

## 2018-02-27 ENCOUNTER — Other Ambulatory Visit: Payer: Self-pay | Admitting: Internal Medicine

## 2018-03-20 ENCOUNTER — Ambulatory Visit (INDEPENDENT_AMBULATORY_CARE_PROVIDER_SITE_OTHER): Payer: Medicare Other | Admitting: Internal Medicine

## 2018-03-20 ENCOUNTER — Encounter: Payer: Self-pay | Admitting: Internal Medicine

## 2018-03-20 VITALS — BP 140/88 | HR 59 | Temp 98.0°F | Ht 68.0 in | Wt 172.0 lb

## 2018-03-20 DIAGNOSIS — I10 Essential (primary) hypertension: Secondary | ICD-10-CM

## 2018-03-20 DIAGNOSIS — R21 Rash and other nonspecific skin eruption: Secondary | ICD-10-CM

## 2018-03-20 DIAGNOSIS — B029 Zoster without complications: Secondary | ICD-10-CM | POA: Diagnosis not present

## 2018-03-20 DIAGNOSIS — N951 Menopausal and female climacteric states: Secondary | ICD-10-CM

## 2018-03-20 DIAGNOSIS — I2510 Atherosclerotic heart disease of native coronary artery without angina pectoris: Secondary | ICD-10-CM | POA: Diagnosis not present

## 2018-03-20 NOTE — Assessment & Plan Note (Signed)
Toprol, Procardia 

## 2018-03-20 NOTE — Assessment & Plan Note (Signed)
HRT - shots

## 2018-03-20 NOTE — Assessment & Plan Note (Signed)
?  residual scar vs other Derm ref

## 2018-03-20 NOTE — Assessment & Plan Note (Signed)
Toprol, Nifedipine 

## 2018-03-20 NOTE — Progress Notes (Signed)
Subjective:  Patient ID: [name], female    DOB: [birth year]  Age: 72 y.o. MRN: [id number]  CC: No chief complaint on file.   HPI [name] presents for the rash on the scalp - persistent w/pain; neck hurts. Achyness is gone. Pain is better. F/u HRT, HTN, CAD  Outpatient Medications Prior to Visit  Medication Sig Dispense Refill  . aspirin 81 MG EC tablet Take 81 mg by mouth daily.      . Cholecalciferol 1000 UNITS tablet Take 1,000 Units by mouth daily.      . clorazepate (TRANXENE) 15 MG tablet TAKE 1 TABLET BY MOUTH THREE TIMES DAILY AS NEEDED FOR ANXIETY 270 tablet 1  . doxepin (SINEQUAN) 50 MG capsule Take 3 capsules (150 mg total) by mouth at bedtime. 270 capsule 3  . estradiol cypionate (DEPO-ESTRADIOL) 5 MG/ML injection Use 0.3 ml every 18-25 days IM 5 mL 3  . furosemide (LASIX) 40 MG tablet Take 1 tablet (40 mg total) by mouth 2 (two) times daily. 180 tablet 3  . levothyroxine (SYNTHROID, LEVOTHROID) 125 MCG tablet Take 1 tablet (125 mcg total) by mouth daily. 90 tablet 3  . LORazepam (ATIVAN) 0.5 MG tablet TAKE 1 TABLET BY MOUTH AT BEDTIME 90 tablet 1  . methylPREDNISolone (MEDROL DOSEPAK) 4 MG TBPK tablet As directed 21 tablet 0  . metoprolol tartrate (LOPRESSOR) 50 MG tablet Take 1 tablet (50 mg total) by mouth 3 (three) times daily. 270 tablet 3  . NIFEdipine (PROCARDIA-XL/ADALAT-CC/NIFEDICAL-XL) 30 MG 24 hr tablet Take 1 tablet (30 mg total) by mouth daily. 90 tablet 3  . nitroGLYCERIN (NITROLINGUAL) 0.4 MG/SPRAY spray PLACE 1 SPRAY UNDER THE TONGUE EVERY 5 MINUTES AS NEEDED FOR CHEST PAIN. MAY REPEAT 3 TIMES 12 g 1  . potassium chloride SA (KLOR-CON M20) 20 MEQ tablet Take 1 tablet (20 mEq total) by mouth 2 (two) times daily. 180 tablet 3  . valACYclovir (VALTREX) 1000 MG tablet Take 1 tablet (1,000 mg total) by mouth 3 (three) times daily. 21 tablet 0   Facility-Administered Medications Prior to Visit  Medication Dose Route Frequency Provider Last Rate Last Dose    . estradiol cypionate (DEPO-ESTRADIOL) 5 MG/ML injection 1.5 mg  1.5 mg Intramuscular Q30 days Plotnikov, Georgina Quint, MD   1.5 mg at 01/07/18 0839    ROS: Review of Systems  Constitutional: Negative for activity change, appetite change, chills, fatigue and unexpected weight change.  HENT: Negative for congestion, mouth sores and sinus pressure.   Eyes: Negative for visual disturbance.  Respiratory: Negative for cough and chest tightness.   Gastrointestinal: Negative for abdominal pain and nausea.  Genitourinary: Negative for difficulty urinating, frequency and vaginal pain.  Musculoskeletal: Positive for arthralgias and neck pain. Negative for back pain and gait problem.  Skin: Positive for rash. Negative for pallor.  Neurological: Positive for headaches. Negative for dizziness, tremors, weakness and numbness.  Psychiatric/Behavioral: Negative for confusion and sleep disturbance.    Objective:  BP 140/88 (BP Location: Left Arm, Patient Position: Sitting, Cuff Size: Normal)   Pulse (!) 59   Temp 98 F (36.7 C) (Oral)   Ht 5\' 8"  (1.727 m)   Wt 172 lb (78 kg)   SpO2 96%   BMI 26.15 kg/m   BP Readings from Last 3 Encounters:  03/20/18 140/88  02/20/18 (!) 160/100  02/05/18 138/78    Wt Readings from Last 3 Encounters:  03/20/18 172 lb (78 kg)  02/20/18 168 lb (76.2 kg)  02/05/18 169  lb (76.7 kg)    Physical Exam  Constitutional: She appears well-developed. No distress.  HENT:  Head: Normocephalic.  Right Ear: External ear normal.  Left Ear: External ear normal.  Nose: Nose normal.  Mouth/Throat: Oropharynx is clear and moist.  Eyes: Pupils are equal, round, and reactive to light. Conjunctivae are normal. Right eye exhibits no discharge. Left eye exhibits no discharge.  Neck: Normal range of motion. Neck supple. No JVD present. No tracheal deviation present. No thyromegaly present.  Cardiovascular: Normal rate, regular rhythm and normal heart sounds.  Pulmonary/Chest:  No stridor. No respiratory distress. She has no wheezes.  Abdominal: Soft. Bowel sounds are normal. She exhibits no distension and no mass. There is no tenderness. There is no rebound and no guarding.  Musculoskeletal: She exhibits tenderness. She exhibits no edema.  Lymphadenopathy:    She has no cervical adenopathy.  Neurological: She displays normal reflexes. No cranial nerve deficit. She exhibits normal muscle tone. Coordination normal.  Skin: No rash noted. No erythema.  Psychiatric: She has a normal mood and affect. Her behavior is normal. Judgment and thought content normal.  purple macular rash area occip area L>>R, not raised, NT 3x4 cm geographic L heel is tender   Lab Results  Component Value Date   WBC 6.4 03/12/2017   HGB 12.2 03/12/2017   HCT 36.9 03/12/2017   PLT 248.0 03/12/2017   GLUCOSE 93 03/12/2017   CHOL 236 (H) 11/02/2016   TRIG 100.0 11/02/2016   HDL 60.60 11/02/2016   LDLDIRECT 188.2 01/10/2012   LDLCALC 155 (H) 11/02/2016   ALT 8 11/02/2016   AST 12 11/02/2016   NA 140 03/12/2017   K 4.3 03/12/2017   CL 105 03/12/2017   CREATININE 1.13 03/12/2017   BUN 20 03/12/2017   CO2 30 03/12/2017   TSH 2.14 11/02/2016   INR 1.09 08/22/2009    Dg Shoulder Right  Result Date: 02/23/2017 CLINICAL DATA:  Shoulder pain for 2 months.  No known injury. EXAM: RIGHT SHOULDER - 2+ VIEW COMPARISON:  None. FINDINGS: There is no evidence of fracture or dislocation. There is generalized osteopenia. There is no evidence of arthropathy or other focal bone abnormality. Soft tissues are unremarkable. IMPRESSION: No acute osseous injury of the right shoulder. Electronically Signed   By: Elige Ko   On: 02/23/2017 09:35    Assessment & Plan:   There are no diagnoses linked to this encounter.   No orders of the defined types were placed in this encounter.    Follow-up: No follow-ups on file.  Sonda Primes, MD

## 2018-04-21 ENCOUNTER — Other Ambulatory Visit (INDEPENDENT_AMBULATORY_CARE_PROVIDER_SITE_OTHER): Payer: Medicare Other

## 2018-04-21 ENCOUNTER — Ambulatory Visit (INDEPENDENT_AMBULATORY_CARE_PROVIDER_SITE_OTHER): Payer: Medicare Other | Admitting: Internal Medicine

## 2018-04-21 ENCOUNTER — Other Ambulatory Visit: Payer: Self-pay | Admitting: Internal Medicine

## 2018-04-21 ENCOUNTER — Encounter: Payer: Self-pay | Admitting: Internal Medicine

## 2018-04-21 VITALS — BP 160/90 | HR 85 | Temp 97.6°F | Ht 68.0 in | Wt 171.0 lb

## 2018-04-21 DIAGNOSIS — E034 Atrophy of thyroid (acquired): Secondary | ICD-10-CM | POA: Diagnosis not present

## 2018-04-21 DIAGNOSIS — R609 Edema, unspecified: Secondary | ICD-10-CM

## 2018-04-21 DIAGNOSIS — N951 Menopausal and female climacteric states: Secondary | ICD-10-CM | POA: Diagnosis not present

## 2018-04-21 DIAGNOSIS — Z79890 Hormone replacement therapy (postmenopausal): Secondary | ICD-10-CM

## 2018-04-21 DIAGNOSIS — I10 Essential (primary) hypertension: Secondary | ICD-10-CM

## 2018-04-21 DIAGNOSIS — M10072 Idiopathic gout, left ankle and foot: Secondary | ICD-10-CM

## 2018-04-21 DIAGNOSIS — M5481 Occipital neuralgia: Secondary | ICD-10-CM

## 2018-04-21 LAB — BASIC METABOLIC PANEL
BUN: 13 mg/dL (ref 6–23)
CO2: 29 mEq/L (ref 19–32)
Calcium: 9.3 mg/dL (ref 8.4–10.5)
Chloride: 103 mEq/L (ref 96–112)
Creatinine, Ser: 1.21 mg/dL — ABNORMAL HIGH (ref 0.40–1.20)
GFR: 46.15 mL/min — ABNORMAL LOW (ref >60.00)
Glucose, Bld: 108 mg/dL — ABNORMAL HIGH (ref 70–99)
Potassium: 3.9 mEq/L (ref 3.5–5.1)
Sodium: 141 mEq/L (ref 135–145)

## 2018-04-21 LAB — D-DIMER, QUANTITATIVE: D-Dimer, Quant: 0.55 mcg/mL FEU — ABNORMAL HIGH (ref <0.50)

## 2018-04-21 LAB — HEPATIC FUNCTION PANEL
ALT: 18 U/L (ref 0–35)
AST: 20 U/L (ref 0–37)
Albumin: 4.3 g/dL (ref 3.5–5.2)
Alkaline Phosphatase: 93 U/L (ref 39–117)
Bilirubin, Direct: 0.1 mg/dL (ref 0.0–0.3)
Total Bilirubin: 0.4 mg/dL (ref 0.2–1.2)
Total Protein: 7.5 g/dL (ref 6.0–8.3)

## 2018-04-21 LAB — TSH: TSH: 6.95 u[IU]/mL — ABNORMAL HIGH (ref 0.35–4.50)

## 2018-04-21 LAB — BRAIN NATRIURETIC PEPTIDE: Pro B Natriuretic peptide (BNP): 63 pg/mL (ref 0.0–100.0)

## 2018-04-21 MED ORDER — METHYLPREDNISOLONE ACETATE 80 MG/ML IJ SUSP
80.0000 mg | Freq: Once | INTRAMUSCULAR | Status: AC
  Administered 2018-04-21: 80 mg via INTRAMUSCULAR

## 2018-04-21 NOTE — Assessment & Plan Note (Signed)
L 1st MTP See Rx

## 2018-04-21 NOTE — Assessment & Plan Note (Signed)
Worse - R and L  foot edema trace R>L 12/19 Poss due to Nifedipine Compr socks Labs

## 2018-04-21 NOTE — Assessment & Plan Note (Signed)
Estardiol inj

## 2018-04-21 NOTE — Assessment & Plan Note (Signed)
Better  

## 2018-04-21 NOTE — Assessment & Plan Note (Signed)
TSH 

## 2018-04-21 NOTE — Assessment & Plan Note (Signed)
BP Readings from Last 3 Encounters:  04/21/18 (!) 160/90  03/20/18 140/88  02/20/18 (!) 160/100

## 2018-04-21 NOTE — Progress Notes (Signed)
Subjective:  Patient ID: [name], female    DOB: [birth year]  Age: 72 y.o. MRN: [id number]  CC: No chief complaint on file.   HPI [name] presents for L foot pain -new - feels like gout in the 1st MTP C/o R foot swelling w/o pain F/u menopause  Outpatient Medications Prior to Visit  Medication Sig Dispense Refill  . aspirin 81 MG EC tablet Take 81 mg by mouth daily.      . Cholecalciferol 1000 UNITS tablet Take 1,000 Units by mouth daily.      . clorazepate (TRANXENE) 15 MG tablet TAKE 1 TABLET BY MOUTH THREE TIMES DAILY AS NEEDED FOR ANXIETY 270 tablet 1  . doxepin (SINEQUAN) 50 MG capsule Take 3 capsules (150 mg total) by mouth at bedtime. 270 capsule 3  . estradiol cypionate (DEPO-ESTRADIOL) 5 MG/ML injection Use 0.3 ml every 18-25 days IM 5 mL 3  . furosemide (LASIX) 40 MG tablet Take 1 tablet (40 mg total) by mouth 2 (two) times daily. 180 tablet 3  . levothyroxine (SYNTHROID, LEVOTHROID) 125 MCG tablet Take 1 tablet (125 mcg total) by mouth daily. 90 tablet 3  . LORazepam (ATIVAN) 0.5 MG tablet TAKE 1 TABLET BY MOUTH AT BEDTIME 90 tablet 1  . methylPREDNISolone (MEDROL DOSEPAK) 4 MG TBPK tablet As directed 21 tablet 0  . metoprolol tartrate (LOPRESSOR) 50 MG tablet Take 1 tablet (50 mg total) by mouth 3 (three) times daily. 270 tablet 3  . NIFEdipine (PROCARDIA-XL/ADALAT-CC/NIFEDICAL-XL) 30 MG 24 hr tablet Take 1 tablet (30 mg total) by mouth daily. 90 tablet 3  . nitroGLYCERIN (NITROLINGUAL) 0.4 MG/SPRAY spray PLACE 1 SPRAY UNDER THE TONGUE EVERY 5 MINUTES AS NEEDED FOR CHEST PAIN. MAY REPEAT 3 TIMES 12 g 1  . potassium chloride SA (KLOR-CON M20) 20 MEQ tablet Take 1 tablet (20 mEq total) by mouth 2 (two) times daily. 180 tablet 3  . valACYclovir (VALTREX) 1000 MG tablet Take 1 tablet (1,000 mg total) by mouth 3 (three) times daily. 21 tablet 0   Facility-Administered Medications Prior to Visit  Medication Dose Route Frequency Provider Last Rate Last Dose  . estradiol  cypionate (DEPO-ESTRADIOL) 5 MG/ML injection 1.5 mg  1.5 mg Intramuscular Q30 days Plotnikov, Georgina Quint, MD   1.5 mg at 03/20/18 0954    ROS: Review of Systems  Constitutional: Negative for activity change, appetite change, chills, fatigue and unexpected weight change.  HENT: Negative for congestion, mouth sores and sinus pressure.   Eyes: Negative for visual disturbance.  Respiratory: Negative for cough and chest tightness.   Gastrointestinal: Negative for abdominal pain and nausea.  Genitourinary: Negative for difficulty urinating, frequency and vaginal pain.  Musculoskeletal: Positive for arthralgias, gait problem and neck stiffness. Negative for back pain.  Skin: Negative for pallor and rash.  Neurological: Negative for dizziness, tremors, weakness, numbness and headaches.  Psychiatric/Behavioral: Positive for sleep disturbance. Negative for confusion and suicidal ideas. The patient is nervous/anxious.     Objective:  BP (!) 160/90 (BP Location: Left Arm, Patient Position: Sitting, Cuff Size: Normal)   Pulse 85   Temp 97.6 F (36.4 C) (Oral)   Ht 5\' 8"  (1.727 m)   Wt 171 lb (77.6 kg)   SpO2 97%   BMI 26.00 kg/m   BP Readings from Last 3 Encounters:  04/21/18 (!) 160/90  03/20/18 140/88  02/20/18 (!) 160/100    Wt Readings from Last 3 Encounters:  04/21/18 171 lb (77.6 kg)  03/20/18 172 lb (  78 kg)  02/20/18 168 lb (76.2 kg)    Physical Exam  Constitutional: She appears well-developed. No distress.  HENT:  Head: Normocephalic.  Right Ear: External ear normal.  Left Ear: External ear normal.  Nose: Nose normal.  Mouth/Throat: Oropharynx is clear and moist.  Eyes: Pupils are equal, round, and reactive to light. Conjunctivae are normal. Right eye exhibits no discharge. Left eye exhibits no discharge.  Neck: Normal range of motion. Neck supple. No JVD present. No tracheal deviation present. No thyromegaly present.  Cardiovascular: Normal rate, regular rhythm and normal  heart sounds.  Pulmonary/Chest: No stridor. No respiratory distress. She has no wheezes.  Abdominal: Soft. Bowel sounds are normal. She exhibits no distension and no mass. There is no tenderness. There is no rebound and no guarding.  Musculoskeletal: She exhibits edema and tenderness.  Lymphadenopathy:    She has no cervical adenopathy.  Neurological: She displays normal reflexes. No cranial nerve deficit. She exhibits normal muscle tone. Coordination normal.  Skin: No rash noted. No erythema.  Psychiatric: She has a normal mood and affect. Her behavior is normal. Judgment and thought content normal.   R and L  foot edema trace R>L L st MTP - tender   Lab Results  Component Value Date   WBC 6.4 03/12/2017   HGB 12.2 03/12/2017   HCT 36.9 03/12/2017   PLT 248.0 03/12/2017   GLUCOSE 93 03/12/2017   CHOL 236 (H) 11/02/2016   TRIG 100.0 11/02/2016   HDL 60.60 11/02/2016   LDLDIRECT 188.2 01/10/2012   LDLCALC 155 (H) 11/02/2016   ALT 8 11/02/2016   AST 12 11/02/2016   NA 140 03/12/2017   K 4.3 03/12/2017   CL 105 03/12/2017   CREATININE 1.13 03/12/2017   BUN 20 03/12/2017   CO2 30 03/12/2017   TSH 2.14 11/02/2016   INR 1.09 08/22/2009    Dg Shoulder Right  Result Date: 02/23/2017 CLINICAL DATA:  Shoulder pain for 2 months.  No known injury. EXAM: RIGHT SHOULDER - 2+ VIEW COMPARISON:  None. FINDINGS: There is no evidence of fracture or dislocation. There is generalized osteopenia. There is no evidence of arthropathy or other focal bone abnormality. Soft tissues are unremarkable. IMPRESSION: No acute osseous injury of the right shoulder. Electronically Signed   By: Elige KoHetal  Patel   On: 02/23/2017 09:35    Assessment & Plan:   There are no diagnoses linked to this encounter.   No orders of the defined types were placed in this encounter.    Follow-up: No follow-ups on file.  Sonda PrimesAlex Plotnikov, MD

## 2018-04-22 ENCOUNTER — Telehealth (HOSPITAL_COMMUNITY): Payer: Self-pay | Admitting: *Deleted

## 2018-04-22 ENCOUNTER — Ambulatory Visit (HOSPITAL_COMMUNITY)
Admission: RE | Admit: 2018-04-22 | Discharge: 2018-04-22 | Disposition: A | Discharge status: Home / Self Care | Payer: Medicare Other | Source: Ambulatory Visit | Attending: Family | Admitting: Family

## 2018-04-22 DIAGNOSIS — R609 Edema, unspecified: Secondary | ICD-10-CM | POA: Diagnosis not present

## 2018-04-22 MED ORDER — LEVOTHYROXINE SODIUM 137 MCG PO TABS: 137.0000 ug | ORAL_TABLET | Freq: Every day | ORAL | 11 refills | Status: DC

## 2018-04-22 NOTE — Telephone Encounter (Signed)
04/22/2018 attempted to reach pt to schedule appt for referral from Dr. Posey ReaPlotnikov

## 2018-04-24 ENCOUNTER — Ambulatory Visit: Payer: Medicare Other | Admitting: Internal Medicine

## 2018-05-12 DIAGNOSIS — Z803 Family history of malignant neoplasm of breast: Secondary | ICD-10-CM | POA: Diagnosis not present

## 2018-05-12 DIAGNOSIS — Z1231 Encounter for screening mammogram for malignant neoplasm of breast: Secondary | ICD-10-CM | POA: Diagnosis not present

## 2018-05-12 LAB — HM MAMMOGRAPHY

## 2018-05-29 ENCOUNTER — Ambulatory Visit (INDEPENDENT_AMBULATORY_CARE_PROVIDER_SITE_OTHER): Payer: Medicare Other | Admitting: Internal Medicine

## 2018-05-29 ENCOUNTER — Encounter: Payer: Self-pay | Admitting: Internal Medicine

## 2018-05-29 DIAGNOSIS — N951 Menopausal and female climacteric states: Secondary | ICD-10-CM | POA: Diagnosis not present

## 2018-05-29 DIAGNOSIS — M436 Torticollis: Secondary | ICD-10-CM

## 2018-05-29 DIAGNOSIS — F419 Anxiety disorder, unspecified: Secondary | ICD-10-CM | POA: Diagnosis not present

## 2018-05-29 DIAGNOSIS — R609 Edema, unspecified: Secondary | ICD-10-CM | POA: Diagnosis not present

## 2018-05-29 DIAGNOSIS — Z79890 Hormone replacement therapy (postmenopausal): Secondary | ICD-10-CM

## 2018-05-29 DIAGNOSIS — I10 Essential (primary) hypertension: Secondary | ICD-10-CM | POA: Diagnosis not present

## 2018-05-29 DIAGNOSIS — F329 Major depressive disorder, single episode, unspecified: Secondary | ICD-10-CM | POA: Diagnosis not present

## 2018-05-29 MED ORDER — ESTRADIOL CYPIONATE 5 MG/ML IM OIL: TOPICAL_OIL | INTRAMUSCULAR | 3 refills | Status: DC

## 2018-05-29 NOTE — Assessment & Plan Note (Signed)
  Toprol, Nifedipine 

## 2018-05-29 NOTE — Progress Notes (Signed)
Subjective:  Patient ID: [name], female    DOB: 10/07/1945  Age: 73 y.o. MRN: [id number]  CC: No chief complaint on file.   HPI [name] StallRuby C Wojciak presents for hot flashes, arthritis, anxiety f/u L foot is better, swelling is better  Outpatient Medications Prior to Visit  Medication Sig Dispense Refill  . aspirin 81 MG EC tablet Take 81 mg by mouth daily.      . Cholecalciferol 1000 UNITS tablet Take 1,000 Units by mouth daily.      . clorazepate (TRANXENE) 15 MG tablet TAKE 1 TABLET BY MOUTH THREE TIMES DAILY AS NEEDED FOR ANXIETY 270 tablet 1  . doxepin (SINEQUAN) 50 MG capsule Take 3 capsules (150 mg total) by mouth at bedtime. 270 capsule 3  . estradiol cypionate (DEPO-ESTRADIOL) 5 MG/ML injection Use 0.3 ml every 18-25 days IM 5 mL 3  . furosemide (LASIX) 40 MG tablet Take 1 tablet (40 mg total) by mouth 2 (two) times daily. 180 tablet 3  . levothyroxine (SYNTHROID, LEVOTHROID) 137 MCG tablet Take 1 tablet (137 mcg total) by mouth daily before breakfast. 30 tablet 11  . LORazepam (ATIVAN) 0.5 MG tablet TAKE 1 TABLET BY MOUTH AT BEDTIME 90 tablet 1  . methylPREDNISolone (MEDROL DOSEPAK) 4 MG TBPK tablet As directed 21 tablet 0  . metoprolol tartrate (LOPRESSOR) 50 MG tablet Take 1 tablet (50 mg total) by mouth 3 (three) times daily. 270 tablet 3  . NIFEdipine (PROCARDIA-XL/ADALAT-CC/NIFEDICAL-XL) 30 MG 24 hr tablet Take 1 tablet (30 mg total) by mouth daily. 90 tablet 3  . nitroGLYCERIN (NITROLINGUAL) 0.4 MG/SPRAY spray PLACE 1 SPRAY UNDER THE TONGUE EVERY 5 MINUTES AS NEEDED FOR CHEST PAIN. MAY REPEAT 3 TIMES 12 g 1  . potassium chloride SA (KLOR-CON M20) 20 MEQ tablet Take 1 tablet (20 mEq total) by mouth 2 (two) times daily. 180 tablet 3  . valACYclovir (VALTREX) 1000 MG tablet Take 1 tablet (1,000 mg total) by mouth 3 (three) times daily. (Patient not taking: Reported on 05/29/2018) 21 tablet 0   Facility-Administered Medications Prior to Visit  Medication Dose Route Frequency  Provider Last Rate Last Dose  . estradiol cypionate (DEPO-ESTRADIOL) 5 MG/ML injection 1.5 mg  1.5 mg Intramuscular Q30 days Plotnikov, Georgina QuintAleksei V, MD   1.5 mg at 04/21/18 1154    ROS: Review of Systems  Constitutional: Negative for activity change, appetite change, chills, fatigue and unexpected weight change.  HENT: Negative for congestion, mouth sores and sinus pressure.   Eyes: Negative for visual disturbance.  Respiratory: Negative for cough and chest tightness.   Gastrointestinal: Negative for abdominal pain and nausea.  Genitourinary: Negative for difficulty urinating, frequency and vaginal pain.  Musculoskeletal: Positive for arthralgias. Negative for back pain and gait problem.  Skin: Negative for pallor and rash.  Neurological: Negative for dizziness, tremors, weakness, numbness and headaches.  Psychiatric/Behavioral: Positive for sleep disturbance. Negative for confusion. The patient is nervous/anxious.     Objective:  BP (!) 142/86 (BP Location: Left Arm, Patient Position: Sitting, Cuff Size: Normal)   Pulse 67   Temp 97.7 F (36.5 C) (Oral)   Ht 5\' 8"  (1.727 m)   Wt 166 lb (75.3 kg)   SpO2 97%   BMI 25.24 kg/m   BP Readings from Last 3 Encounters:  05/29/18 (!) 142/86  04/21/18 (!) 160/90  03/20/18 140/88    Wt Readings from Last 3 Encounters:  05/29/18 166 lb (75.3 kg)  04/21/18 171 lb (77.6 kg)  03/20/18 172  lb (78 kg)    Physical Exam Constitutional:      General: She is not in acute distress.    Appearance: She is well-developed.  HENT:     Head: Normocephalic.     Right Ear: External ear normal.     Left Ear: External ear normal.     Nose: Nose normal.  Eyes:     General:        Right eye: No discharge.        Left eye: No discharge.     Conjunctiva/sclera: Conjunctivae normal.     Pupils: Pupils are equal, round, and reactive to light.  Neck:     Musculoskeletal: Normal range of motion and neck supple.     Thyroid: No thyromegaly.      Vascular: No JVD.     Trachea: No tracheal deviation.  Cardiovascular:     Rate and Rhythm: Normal rate and regular rhythm.     Heart sounds: Normal heart sounds.  Pulmonary:     Effort: No respiratory distress.     Breath sounds: No stridor. No wheezing.  Abdominal:     General: Bowel sounds are normal. There is no distension.     Palpations: Abdomen is soft. There is no mass.     Tenderness: There is no abdominal tenderness. There is no guarding or rebound.  Musculoskeletal:        General: No tenderness.  Lymphadenopathy:     Cervical: No cervical adenopathy.  Skin:    Findings: No erythema or rash.  Neurological:     Cranial Nerves: No cranial nerve deficit.     Motor: No abnormal muscle tone.     Coordination: Coordination normal.     Deep Tendon Reflexes: Reflexes normal.  Psychiatric:        Behavior: Behavior normal.        Thought Content: Thought content normal.        Judgment: Judgment normal.     Lab Results  Component Value Date   WBC 6.4 03/12/2017   HGB 12.2 03/12/2017   HCT 36.9 03/12/2017   PLT 248.0 03/12/2017   GLUCOSE 108 (H) 04/21/2018   CHOL 236 (H) 11/02/2016   TRIG 100.0 11/02/2016   HDL 60.60 11/02/2016   LDLDIRECT 188.2 01/10/2012   LDLCALC 155 (H) 11/02/2016   ALT 18 04/21/2018   AST 20 04/21/2018   NA 141 04/21/2018   K 3.9 04/21/2018   CL 103 04/21/2018   CREATININE 1.21 (H) 04/21/2018   BUN 13 04/21/2018   CO2 29 04/21/2018   TSH 6.95 (H) 04/21/2018   INR 1.09 08/22/2009    Vas [place] Lower Extremity Venous (dvt)  Result Date: 04/22/2018  Lower Venous Study Indications: Swelling, and Edema. Other Indications: Left foot edema secondary to gout and recent cortisone shot. Performing Technologist: Hardie LoraMatthew Cravey RVT  Examination Guidelines: A complete evaluation includes B-mode imaging, spectral Doppler, color Doppler, and power Doppler as needed of all accessible portions of each vessel. Bilateral testing is considered an integral part  of a complete examination. Limited examinations for reoccurring indications may be performed as noted.  Right Venous Findings: +---------+---------------+---------+-----------+----------+-------+          CompressibilityPhasicitySpontaneityPropertiesSummary +---------+---------------+---------+-----------+----------+-------+ CFV      Full                    Yes                          +---------+---------------+---------+-----------+----------+-------+  SFJ      Full                                                 +---------+---------------+---------+-----------+----------+-------+ FV Prox  Full                    Yes                          +---------+---------------+---------+-----------+----------+-------+ FV Mid   Full                    Yes                          +---------+---------------+---------+-----------+----------+-------+ FV DistalFull                    Yes                          +---------+---------------+---------+-----------+----------+-------+ POP      Full                    Yes                          +---------+---------------+---------+-----------+----------+-------+ PTV      Full                    Yes                          +---------+---------------+---------+-----------+----------+-------+ PERO     Full                    Yes                          +---------+---------------+---------+-----------+----------+-------+ GSV      Full                                                 +---------+---------------+---------+-----------+----------+-------+ SSV      Full                                                 +---------+---------------+---------+-----------+----------+-------+  Left Venous Findings: +---------+---------------+---------+-----------+----------+-------+          CompressibilityPhasicitySpontaneityPropertiesSummary +---------+---------------+---------+-----------+----------+-------+ CFV       Full                    Yes                          +---------+---------------+---------+-----------+----------+-------+ SFJ      Full                                                 +---------+---------------+---------+-----------+----------+-------+  FV Prox  Full                    Yes                          +---------+---------------+---------+-----------+----------+-------+ FV Mid   Full                    Yes                          +---------+---------------+---------+-----------+----------+-------+ FV DistalFull                    Yes                          +---------+---------------+---------+-----------+----------+-------+ POP      Full                    Yes                          +---------+---------------+---------+-----------+----------+-------+ PTV      Full                    Yes                          +---------+---------------+---------+-----------+----------+-------+ PERO     Full                    Yes                          +---------+---------------+---------+-----------+----------+-------+ GSV      Full                                                 +---------+---------------+---------+-----------+----------+-------+ SSV      Full                                                 +---------+---------------+---------+-----------+----------+-------+    Summary: Right: No evidence of deep vein thrombosis in the lower extremity. No indirect evidence of obstruction proximal to the inguinal ligament. No cystic structure found in the popliteal fossa. Left: No evidence of deep vein thrombosis in the lower extremity. No indirect evidence of obstruction proximal to the inguinal ligament. No cystic structure found in the popliteal fossa.  *See table(s) above for measurements and observations. Electronically signed by Gretta Began MD on 04/22/2018 at 5:07:21 PM.    Final     Assessment & Plan:   There are no diagnoses  linked to this encounter.   No orders of the defined types were placed in this encounter.    Follow-up: No follow-ups on file.  Sonda Primes, MD

## 2018-05-29 NOTE — Patient Instructions (Addendum)
Weighted blanket 

## 2018-05-29 NOTE — Assessment & Plan Note (Signed)
Weighted blanket option discussed 

## 2018-05-29 NOTE — Assessment & Plan Note (Signed)
Chronic HRT estradiol injections  Potential benefits of a long term HRT use as well as potential risks  and complications were explained to the patient and were aknowledged.

## 2018-05-29 NOTE — Assessment & Plan Note (Signed)
Compression socks helped

## 2018-05-29 NOTE — Assessment & Plan Note (Signed)
Resolved

## 2018-06-26 ENCOUNTER — Ambulatory Visit: Payer: Medicare Other | Admitting: Internal Medicine

## 2018-07-02 ENCOUNTER — Encounter: Payer: Self-pay | Admitting: Internal Medicine

## 2018-07-17 ENCOUNTER — Ambulatory Visit (INDEPENDENT_AMBULATORY_CARE_PROVIDER_SITE_OTHER): Payer: Medicare Other | Admitting: Internal Medicine

## 2018-07-17 ENCOUNTER — Encounter: Payer: Self-pay | Admitting: Internal Medicine

## 2018-07-17 ENCOUNTER — Other Ambulatory Visit (INDEPENDENT_AMBULATORY_CARE_PROVIDER_SITE_OTHER): Payer: Medicare Other

## 2018-07-17 DIAGNOSIS — I10 Essential (primary) hypertension: Secondary | ICD-10-CM | POA: Diagnosis not present

## 2018-07-17 DIAGNOSIS — M7662 Achilles tendinitis, left leg: Secondary | ICD-10-CM

## 2018-07-17 DIAGNOSIS — R609 Edema, unspecified: Secondary | ICD-10-CM | POA: Diagnosis not present

## 2018-07-17 DIAGNOSIS — Z79890 Hormone replacement therapy (postmenopausal): Secondary | ICD-10-CM

## 2018-07-17 DIAGNOSIS — M5481 Occipital neuralgia: Secondary | ICD-10-CM

## 2018-07-17 DIAGNOSIS — E034 Atrophy of thyroid (acquired): Secondary | ICD-10-CM

## 2018-07-17 DIAGNOSIS — N951 Menopausal and female climacteric states: Secondary | ICD-10-CM | POA: Diagnosis not present

## 2018-07-17 DIAGNOSIS — I2510 Atherosclerotic heart disease of native coronary artery without angina pectoris: Secondary | ICD-10-CM | POA: Diagnosis not present

## 2018-07-17 LAB — T4, FREE: Free T4: 1.32 ng/dL (ref 0.60–1.60)

## 2018-07-17 LAB — TSH: TSH: 0.15 u[IU]/mL — ABNORMAL LOW (ref 0.35–4.50)

## 2018-07-17 NOTE — Assessment & Plan Note (Signed)
Levothroid 

## 2018-07-17 NOTE — Assessment & Plan Note (Signed)
No relapse 

## 2018-07-17 NOTE — Assessment & Plan Note (Signed)
Toprol, Nifedipine 

## 2018-07-17 NOTE — Assessment & Plan Note (Signed)
Doing better w/less work

## 2018-07-17 NOTE — Progress Notes (Signed)
Subjective:  Patient ID: [name], female    DOB: [birth year]  Age: 73 y.o. MRN: [id number]  CC: No chief complaint on file.   HPI [name] presents for menopause, CAD, HTN f/u  Outpatient Medications Prior to Visit  Medication Sig Dispense Refill  . aspirin 81 MG EC tablet Take 81 mg by mouth daily.      . Cholecalciferol 1000 UNITS tablet Take 1,000 Units by mouth daily.      . clorazepate (TRANXENE) 15 MG tablet TAKE 1 TABLET BY MOUTH THREE TIMES DAILY AS NEEDED FOR ANXIETY 270 tablet 1  . doxepin (SINEQUAN) 50 MG capsule Take 3 capsules (150 mg total) by mouth at bedtime. 270 capsule 3  . estradiol cypionate (DEPO-ESTRADIOL) 5 MG/ML injection Use 0.3 ml every 18-25 days IM 5 mL 3  . furosemide (LASIX) 40 MG tablet Take 1 tablet (40 mg total) by mouth 2 (two) times daily. 180 tablet 3  . levothyroxine (SYNTHROID, LEVOTHROID) 137 MCG tablet Take 1 tablet (137 mcg total) by mouth daily before breakfast. 30 tablet 11  . LORazepam (ATIVAN) 0.5 MG tablet TAKE 1 TABLET BY MOUTH AT BEDTIME 90 tablet 1  . metoprolol tartrate (LOPRESSOR) 50 MG tablet Take 1 tablet (50 mg total) by mouth 3 (three) times daily. 270 tablet 3  . NIFEdipine (PROCARDIA-XL/ADALAT-CC/NIFEDICAL-XL) 30 MG 24 hr tablet Take 1 tablet (30 mg total) by mouth daily. 90 tablet 3  . nitroGLYCERIN (NITROLINGUAL) 0.4 MG/SPRAY spray PLACE 1 SPRAY UNDER THE TONGUE EVERY 5 MINUTES AS NEEDED FOR CHEST PAIN. MAY REPEAT 3 TIMES 12 g 1  . potassium chloride SA (KLOR-CON M20) 20 MEQ tablet Take 1 tablet (20 mEq total) by mouth 2 (two) times daily. 180 tablet 3  . methylPREDNISolone (MEDROL DOSEPAK) 4 MG TBPK tablet As directed (Patient not taking: Reported on 07/17/2018) 21 tablet 0   Facility-Administered Medications Prior to Visit  Medication Dose Route Frequency Provider Last Rate Last Dose  . estradiol cypionate (DEPO-ESTRADIOL) 5 MG/ML injection 1.5 mg  1.5 mg Intramuscular Q30 days Plotnikov, Georgina Quint, MD   1.5 mg at  05/29/18 1625    ROS: Review of Systems  Constitutional: Positive for fatigue. Negative for activity change, appetite change, chills and unexpected weight change.  HENT: Negative for congestion, mouth sores and sinus pressure.   Eyes: Negative for visual disturbance.  Respiratory: Negative for cough and chest tightness.   Gastrointestinal: Negative for abdominal pain and nausea.  Genitourinary: Negative for difficulty urinating, frequency and vaginal pain.  Musculoskeletal: Positive for arthralgias. Negative for back pain and gait problem.  Skin: Negative for pallor and rash.  Neurological: Negative for dizziness, tremors, weakness, numbness and headaches.  Psychiatric/Behavioral: Positive for dysphoric mood and sleep disturbance. Negative for confusion and suicidal ideas. The patient is nervous/anxious.     Objective:  BP 134/82 (BP Location: Left Arm, Patient Position: Sitting, Cuff Size: Normal)   Pulse 75   Temp 97.9 F (36.6 C) (Oral)   Ht  (1.727 m)   Wt 167 lb (75.8 kg)   SpO2 97%   BMI 25.39 kg/m   BP Readings from Last 3 Encounters:  07/17/18 134/82  05/29/18 (!) 142/86  04/21/18 (!) 160/90    Wt Readings from Last 3 Encounters:  07/17/18 167 lb (75.8 kg)  05/29/18 166 lb (75.3 kg)  04/21/18 171 lb (77.6 kg)    Physical Exam Constitutional:      General: She is not in acute distress.  Appearance: She is well-developed.  HENT:     Head: Normocephalic.     Right Ear: External ear normal.     Left Ear: External ear normal.     Nose: Nose normal.  Eyes:     General:        Right eye: No discharge.        Left eye: No discharge.     Conjunctiva/sclera: Conjunctivae normal.     Pupils: Pupils are equal, round, and reactive to light.  Neck:     Musculoskeletal: Normal range of motion and neck supple.     Thyroid: No thyromegaly.     Vascular: No JVD.     Trachea: No tracheal deviation.  Cardiovascular:     Rate and Rhythm: Normal rate and regular  rhythm.     Heart sounds: Normal heart sounds.  Pulmonary:     Effort: No respiratory distress.     Breath sounds: No stridor. No wheezing.  Abdominal:     General: Bowel sounds are normal. There is no distension.     Palpations: Abdomen is soft. There is no mass.     Tenderness: There is no abdominal tenderness. There is no guarding or rebound.  Musculoskeletal:        General: Tenderness present.  Lymphadenopathy:     Cervical: No cervical adenopathy.  Skin:    Findings: No erythema or rash.  Neurological:     Cranial Nerves: No cranial nerve deficit.     Motor: No abnormal muscle tone.     Coordination: Coordination normal.     Deep Tendon Reflexes: Reflexes normal.  Psychiatric:        Behavior: Behavior normal.        Thought Content: Thought content normal.        Judgment: Judgment normal.     Lab Results  Component Value Date   WBC 6.4 03/12/2017   HGB 12.2 03/12/2017   HCT 36.9 03/12/2017   PLT 248.0 03/12/2017   GLUCOSE 108 (H) 04/21/2018   CHOL 236 (H) 11/02/2016   TRIG 100.0 11/02/2016   HDL 60.60 11/02/2016   LDLDIRECT 188.2 01/10/2012   LDLCALC 155 (H) 11/02/2016   ALT 18 04/21/2018   AST 20 04/21/2018   NA 141 04/21/2018   K 3.9 04/21/2018   CL 103 04/21/2018   CREATININE 1.21 (H) 04/21/2018   BUN 13 04/21/2018   CO2 29 04/21/2018   TSH 6.95 (H) 04/21/2018   INR 1.09 08/22/2009    Vas [place] Lower Extremity Venous (dvt)  Result Date: 04/22/2018  Lower Venous Study Indications: Swelling, and Edema. Other Indications: Left foot edema secondary to gout and recent cortisone shot. Performing Technologist: Hardie LoraMatthew Cravey RVT  Examination Guidelines: A complete evaluation includes B-mode imaging, spectral Doppler, color Doppler, and power Doppler as needed of all accessible portions of each vessel. Bilateral testing is considered an integral part of a complete examination. Limited examinations for reoccurring indications may be performed as noted.  Right  Venous Findings: +---------+---------------+---------+-----------+----------+-------+          CompressibilityPhasicitySpontaneityPropertiesSummary +---------+---------------+---------+-----------+----------+-------+ CFV      Full                    Yes                          +---------+---------------+---------+-----------+----------+-------+ SFJ      Full                                                 +---------+---------------+---------+-----------+----------+-------+  FV Prox  Full                    Yes                          +---------+---------------+---------+-----------+----------+-------+ FV Mid   Full                    Yes                          +---------+---------------+---------+-----------+----------+-------+ FV DistalFull                    Yes                          +---------+---------------+---------+-----------+----------+-------+ POP      Full                    Yes                          +---------+---------------+---------+-----------+----------+-------+ PTV      Full                    Yes                          +---------+---------------+---------+-----------+----------+-------+ PERO     Full                    Yes                          +---------+---------------+---------+-----------+----------+-------+ GSV      Full                                                 +---------+---------------+---------+-----------+----------+-------+ SSV      Full                                                 +---------+---------------+---------+-----------+----------+-------+  Left Venous Findings: +---------+---------------+---------+-----------+----------+-------+          CompressibilityPhasicitySpontaneityPropertiesSummary +---------+---------------+---------+-----------+----------+-------+ CFV      Full                    Yes                           +---------+---------------+---------+-----------+----------+-------+ SFJ      Full                                                 +---------+---------------+---------+-----------+----------+-------+ FV Prox  Full                    Yes                          +---------+---------------+---------+-----------+----------+-------+ FV Mid   Full  Yes                          +---------+---------------+---------+-----------+----------+-------+ FV DistalFull                    Yes                          +---------+---------------+---------+-----------+----------+-------+ POP      Full                    Yes                          +---------+---------------+---------+-----------+----------+-------+ PTV      Full                    Yes                          +---------+---------------+---------+-----------+----------+-------+ PERO     Full                    Yes                          +---------+---------------+---------+-----------+----------+-------+ GSV      Full                                                 +---------+---------------+---------+-----------+----------+-------+ SSV      Full                                                 +---------+---------------+---------+-----------+----------+-------+    Summary: Right: No evidence of deep vein thrombosis in the lower extremity. No indirect evidence of obstruction proximal to the inguinal ligament. No cystic structure found in the popliteal fossa. Left: No evidence of deep vein thrombosis in the lower extremity. No indirect evidence of obstruction proximal to the inguinal ligament. No cystic structure found in the popliteal fossa.  *See table(s) above for measurements and observations. Electronically signed by Gretta Began MD on 04/22/2018 at 5:07:21 PM.    Final     Assessment & Plan:   There are no diagnoses linked to this encounter.   No orders of the defined types were  placed in this encounter.    Follow-up: No follow-ups on file.  Sonda Primes, MD

## 2018-07-17 NOTE — Assessment & Plan Note (Signed)
Depo-Estradiol 0.3 ml  Potential benefits of a long term HRT  use as well as potential risks  and complications were explained to the patient and were aknowledged. Discussed cutting back on Rx or weaning off

## 2018-07-17 NOTE — Assessment & Plan Note (Signed)
No angina Toprol, Procardia

## 2018-07-17 NOTE — Assessment & Plan Note (Signed)
Discussed cutting back on Rx or weaning off

## 2018-07-28 ENCOUNTER — Encounter: Payer: Self-pay | Admitting: Nurse Practitioner

## 2018-07-28 ENCOUNTER — Ambulatory Visit (INDEPENDENT_AMBULATORY_CARE_PROVIDER_SITE_OTHER): Payer: Medicare Other | Admitting: Nurse Practitioner

## 2018-07-28 VITALS — BP 140/88 | HR 80 | Temp 97.5°F | Ht 68.0 in

## 2018-07-28 DIAGNOSIS — J988 Other specified respiratory disorders: Secondary | ICD-10-CM

## 2018-07-28 MED ORDER — AZITHROMYCIN 250 MG PO TABS: ORAL_TABLET | ORAL | 0 refills | Status: DC

## 2018-07-28 NOTE — Progress Notes (Signed)
[name] is a 73 y.o. female with the following history as recorded in [employer]:  Patient Active Problem List   Diagnosis Date Noted  . Edema 04/21/2018  . Herpes zoster 02/20/2018  . Occipital neuralgia of left side 01/07/2018  . Hormone replacement therapy (HRT) 07/25/2017  . Hair loss 05/31/2017  . Upper respiratory infection 05/01/2017  . Achilles tendinitis 03/12/2017  . Gout 01/09/2017  . Shoulder effusion, right 12/28/2016  . Neck stiffness 12/28/2016  . Heel pain, chronic, left 12/11/2016  . Calcaneal bursitis (heel), left 11/13/2016  . Toenail avulsion, initial encounter 11/13/2016  . Traumatic hematoma of foot, right, sequela 09/27/2016  . Adnexal mass 01/04/2016  . Right adrenal mass (HCC) 12/29/2015  . Cataract 10/10/2015  . Abnormal abdominal x-ray 12/23/2014  . GERD (gastroesophageal reflux disease) 11/07/2012  . Hot flash, menopausal 09/10/2012  . Well adult exam 06/13/2011  . GAIT DISTURBANCE 11/22/2009  . PARESTHESIA 11/22/2009  . BACK PAIN, LUMBAR 11/03/2009  . Hypothyroidism 02/11/2007  . Dyslipidemia 02/11/2007  . Chronic anxiety 02/11/2007  . Depression 02/11/2007  . Essential hypertension 02/11/2007  . Coronary atherosclerosis 02/11/2007  . 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 CAROTID BRUIT 02/11/2007    Current Outpatient Medications  Medication Sig Dispense Refill  . aspirin 81 MG EC tablet Take 81 mg by mouth daily.      . Cholecalciferol 1000 UNITS tablet Take 1,000 Units by mouth daily.      . clorazepate (TRANXENE) 15 MG tablet TAKE 1 TABLET BY MOUTH THREE TIMES DAILY AS NEEDED FOR ANXIETY 270 tablet 1  . doxepin (SINEQUAN) 50 MG capsule Take 3 capsules (150 mg total) by mouth at bedtime. 270 capsule 3  . estradiol cypionate (DEPO-ESTRADIOL) 5 MG/ML injection Use 0.3 ml every 18-25 days IM 5 mL 3  . furosemide (LASIX) 40 MG tablet Take 1 tablet (40 mg total) by mouth 2 (two) times daily. 180 tablet 3  . levothyroxine (SYNTHROID,  LEVOTHROID) 137 MCG tablet Take 1 tablet (137 mcg total) by mouth daily before breakfast. 30 tablet 11  . LORazepam (ATIVAN) 0.5 MG tablet TAKE 1 TABLET BY MOUTH AT BEDTIME 90 tablet 1  . metoprolol tartrate (LOPRESSOR) 50 MG tablet Take 1 tablet (50 mg total) by mouth 3 (three) times daily. 270 tablet 3  . NIFEdipine (PROCARDIA-XL/ADALAT-CC/NIFEDICAL-XL) 30 MG 24 hr tablet Take 1 tablet (30 mg total) by mouth daily. 90 tablet 3  . nitroGLYCERIN (NITROLINGUAL) 0.4 MG/SPRAY spray PLACE 1 SPRAY UNDER THE TONGUE EVERY 5 MINUTES AS NEEDED FOR CHEST PAIN. MAY REPEAT 3 TIMES 12 g 1  . potassium chloride SA (KLOR-CON M20) 20 MEQ tablet Take 1 tablet (20 mEq total) by mouth 2 (two) times daily. 180 tablet 3  . azithromycin (ZITHROMAX) 250 MG tablet Take 2 pills today then 1 pill daily until complete 6 tablet 0   Current Facility-Administered Medications  Medication Dose Route Frequency Provider Last Rate Last Dose  . estradiol cypionate (DEPO-ESTRADIOL) 5 MG/ML injection 1.5 mg  1.5 mg Intramuscular Q30 days [name], [name] Quint, MD   1.5 mg at 07/17/18 1516    Allergies: Epinephrine; Isosorbide mononitrate; Lidocaine; Morphine; Propoxyphene n-acetaminophen; Alprazolam; Atorvastatin; Codeine; Ezetimibe; Prilosec [omeprazole]; Rosuvastatin; Simvastatin; Allopurinol; Meloxicam; Tetracycline; Amoxicillin; Ciprofloxacin; and Diphenhydramine hcl  Past Medical History:  Diagnosis Date  . Anxiety state, unspecified   . Bronchitis, acute   . Carotid bruit   . Coronary atherosclerosis of unspecified type of vessel, native or graft    multiple angioplasy procedures in 90's/early  2000's  . De Quervain's tenosynovitis   . Depressive disorder, not elsewhere classified   . Female bladder prolapse   . Lichen 2011   Vaginal  . Other and unspecified hyperlipidemia   . Unspecified essential hypertension   . Unspecified hypothyroidism     History reviewed. No pertinent surgical history.  Family History  Problem  Relation Age of Onset  . Hypertension Mother   . Hypertension Father   . Hypertension Other   . Coronary artery disease Other     Social History   Tobacco Use  . Smoking status: Former Smoker    Last attempt to quit: 07/22/1991    Years since quitting: 27.0  . Smokeless tobacco: Never Used  Substance Use Topics  . Alcohol use: No     Subjective:  [name] is here today requesting evaluation of acute complaint of cough/cold symptoms, which first began about 4 days ago, last Thursday with cough, also noted some mild chills, malaise but says she overall feels well, just wants to get an antibiotic Rx for the cough. Denies: syncope, confusion, body aches, headaches, sinus pain/pressure, ear pain/ pressure, sore throat, chest pain, shortness of breath, heartburn, abdominal pain, nausea,vomiting, diarrhea Smoker? Quit years ago Tried at home: tylenol, tussin, hot soup- helps some  Improvement/worsening since onset? Feels the same Recent travel: no Sick contacts: works around public, part time at [employer]  ROS- See HPI  Objective:  Vitals:   07/28/18 1026  BP: 140/88  Pulse: 80  Temp: (!) 97.5 F (36.4 C)  TempSrc: Oral  SpO2: 97%  Height: 5\' 8"  (1.727 m)    General: Well developed, well nourished, in no acute distress  Skin : Warm and dry.  Head: Normocephalic and atraumatic  Eyes: Sclera and conjunctiva clear; pupils round and reactive to light; extraocular movements intact  Ears: External normal; canals clear; tympanic membranes normal  Oropharynx: Pink, supple. No suspicious lesions  Neck: Supple without thyromegaly, adenopathy  Lungs: Respirations unlabored; clear to auscultation bilaterally without wheeze, rales, rhonchi  CVS exam: normal rate and regular rhythm, S1 and S2 normal.   Extremities: No edema, cyanosis, clubbing  Vessels: Symmetric bilaterally  Neurologic: Alert and oriented; speech intact; face symmetrical; moves all extremities well; CNII-XII intact  without focal deficit  Psychiatric: Normal mood and affect.  Assessment:  1. RTI (respiratory tract infection)     Plan:   Pe, vs stable She does not appear ill Suspect symptoms are viral, or possibly alleriges, which we discussed Offered tessalon, other prescriptive options to treat cough, she declines and says she tolerates most medications poorly, and does not want to try any new medication She is adamant about an antibiotic rx- I have printed z-pak for her with dosing, side effects discussed along with discussion about antibiotic stewardship, she was instructed to begin antibiotic course only if symptoms are no better in 1 week Home management, red flags and return precautions including when to seek immediate/follow up care discussed and printed on AVS  No follow-ups on file.  No orders of the defined types were placed in this encounter.   Requested Prescriptions   Signed Prescriptions Disp Refills  . azithromycin (ZITHROMAX) 250 MG tablet 6 tablet 0    Sig: Take 2 pills today then 1 pill daily until complete

## 2018-07-28 NOTE — Patient Instructions (Addendum)
  Please follow up for fevers over 101, if your symptoms get worse If you feel no better by Thursday you can start the antibiotic as we discussed   Cough, Adult  A cough helps to clear your throat and lungs. A cough may last only 2-3 weeks (acute), or it may last longer than 8 weeks (chronic). Many different things can cause a cough. A cough may be a sign of an illness or another medical condition. Follow these instructions at home:  Pay attention to any changes in your cough.  Take medicines only as told by your doctor. ? If you were prescribed an antibiotic medicine, take it as told by your doctor. Do not stop taking it even if you start to feel better. ? Talk with your doctor before you try using a cough medicine.  Drink enough fluid to keep your pee (urine) clear or pale yellow.  If the air is dry, use a cold steam vaporizer or humidifier in your home.  Stay away from things that make you cough at work or at home.  If your cough is worse at night, try using extra pillows to raise your head up higher while you sleep.  Do not smoke, and try not to be around smoke. If you need help quitting, ask your doctor.  Do not have caffeine.  Do not drink alcohol.  Rest as needed. Contact a doctor if:  You have new problems (symptoms).  You cough up yellow fluid (pus).  Your cough does not get better after 2-3 weeks, or your cough gets worse.  Medicine does not help your cough and you are not sleeping well.  You have pain that gets worse or pain that is not helped with medicine.  You have a fever.  You are losing weight and you do not know why.  You have night sweats. Get help right away if:  You cough up blood.  You have trouble breathing.  Your heartbeat is very fast. This information is not intended to replace advice given to you by your health care provider. Make sure you discuss any questions you have with your health care provider. Document Released: 01/18/2011  Document Revised: 10/13/2015 Document Reviewed: 07/14/2014 Elsevier Interactive Patient Education  2019 [employer].

## 2018-07-29 ENCOUNTER — Other Ambulatory Visit (INDEPENDENT_AMBULATORY_CARE_PROVIDER_SITE_OTHER): Payer: Medicare Other

## 2018-07-29 ENCOUNTER — Ambulatory Visit: Payer: Self-pay | Admitting: *Deleted

## 2018-07-29 ENCOUNTER — Ambulatory Visit (INDEPENDENT_AMBULATORY_CARE_PROVIDER_SITE_OTHER): Payer: Medicare Other | Admitting: Nurse Practitioner

## 2018-07-29 ENCOUNTER — Encounter: Payer: Self-pay | Admitting: Nurse Practitioner

## 2018-07-29 VITALS — BP 188/98 | HR 95 | Ht 68.0 in

## 2018-07-29 DIAGNOSIS — R319 Hematuria, unspecified: Secondary | ICD-10-CM

## 2018-07-29 DIAGNOSIS — R195 Other fecal abnormalities: Secondary | ICD-10-CM | POA: Diagnosis not present

## 2018-07-29 DIAGNOSIS — K649 Unspecified hemorrhoids: Secondary | ICD-10-CM

## 2018-07-29 DIAGNOSIS — I10 Essential (primary) hypertension: Secondary | ICD-10-CM | POA: Diagnosis not present

## 2018-07-29 LAB — POCT URINALYSIS DIPSTICK
Bilirubin, UA: 1
Glucose, UA: NEGATIVE
Ketones, UA: NEGATIVE
Nitrite, UA: POSITIVE
Protein, UA: POSITIVE — AB
Spec Grav, UA: 1.015 (ref 1.010–1.025)
Urobilinogen, UA: 1 E.U./dL
pH, UA: 6 (ref 5.0–8.0)

## 2018-07-29 LAB — COMPREHENSIVE METABOLIC PANEL
ALT: 14 U/L (ref 0–35)
AST: 19 U/L (ref 0–37)
Albumin: 4 g/dL (ref 3.5–5.2)
Alkaline Phosphatase: 88 U/L (ref 39–117)
BUN: 14 mg/dL (ref 6–23)
CO2: 26 mEq/L (ref 19–32)
Calcium: 9.1 mg/dL (ref 8.4–10.5)
Chloride: 103 mEq/L (ref 96–112)
Creatinine, Ser: 1.11 mg/dL (ref 0.40–1.20)
GFR: 47.93 mL/min — ABNORMAL LOW (ref >60.00)
Glucose, Bld: 91 mg/dL (ref 70–99)
Potassium: 3.8 mEq/L (ref 3.5–5.1)
Sodium: 139 mEq/L (ref 135–145)
Total Bilirubin: 0.3 mg/dL (ref 0.2–1.2)
Total Protein: 7 g/dL (ref 6.0–8.3)

## 2018-07-29 LAB — CBC
HCT: 36.9 % (ref 36.0–46.0)
Hemoglobin: 12.4 g/dL (ref 12.0–15.0)
MCHC: 33.6 g/dL (ref 30.0–36.0)
MCV: 92.4 fl (ref 78.0–100.0)
Platelets: 239 10*3/uL (ref 150.0–400.0)
RBC: 4 Mil/uL (ref 3.87–5.11)
RDW: 12.8 % (ref 11.5–15.5)
WBC: 4.8 10*3/uL (ref 4.0–10.5)

## 2018-07-29 MED ORDER — NITROFURANTOIN MONOHYD MACRO 100 MG PO CAPS: 100.0000 mg | ORAL_CAPSULE | Freq: Two times a day (BID) | ORAL | 0 refills | Status: DC

## 2018-07-29 NOTE — Telephone Encounter (Signed)
Patient was in the office yesterday for cough- she forgot to mention she had some blood in the urine. Patient is very nervous about this - she has history of cystocle and hemorrhoids and she is very nervous that she is not correct about the source of her bleeding.   Reason for Disposition . Blood in urine  (Exception: could be normal menstrual bleeding)  Answer Assessment - Initial Assessment Questions 1. COLOR of URINE: "Describe the color of the urine."  (e.g., tea-colored, pink, red, blood clots, bloody)     red 2. ONSET: "When did the bleeding start?"      yesterday 3. EPISODES: "How many times has there been blood in the urine?" or "How many times today?"     everytime 4. PAIN with URINATION: "Is there any pain with passing your urine?" If so, ask: "How bad is the pain?"  (Scale 1-10; or mild, moderate, severe)    - MILD - complains slightly about urination hurting    - MODERATE - interferes with normal activities      - SEVERE - excruciating, unwilling or unable to urinate because of the pain      no 5. FEVER: "Do you have a fever?" If so, ask: "What is your temperature, how was it measured, and when did it start?"     no 6. ASSOCIATED SYMPTOMS: "Are you passing urine more frequently than usual?"     no 7. OTHER SYMPTOMS: "Do you have any other symptoms?" (e.g., back/flank pain, abdominal pain, vomiting)     no 8. PREGNANCY: "Is there any chance you are pregnant?" "When was your last menstrual period?"     n/a  Protocols used: URINE - BLOOD IN-A-AH

## 2018-07-29 NOTE — Patient Instructions (Signed)
Start macrobid as prescribed  I will let you know when your culture returns  If you develop any fevers, abdominal or flank pain, nausea or vomiting, weakness or dizziness, please go straight to the ER.   Urinary Tract Infection, Adult A urinary tract infection (UTI) is an infection of any part of the urinary tract. The urinary tract includes:  The kidneys.  The ureters.  The bladder.  The urethra. These organs make, store, and get rid of pee (urine) in the body. What are the causes? This is caused by germs (bacteria) in your genital area. These germs grow and cause swelling (inflammation) of your urinary tract. What increases the risk? You are more likely to develop this condition if:  You have a small, thin tube (catheter) to drain pee.  You cannot control when you pee or poop (incontinence).  You are female, and: ? You use these methods to prevent pregnancy: ? A medicine that kills sperm (spermicide). ? A device that blocks sperm (diaphragm). ? You have low levels of a female hormone (estrogen). ? You are pregnant.  You have genes that add to your risk.  You are sexually active.  You take antibiotic medicines.  You have trouble peeing because of: ? A prostate that is bigger than normal, if you are female. ? A blockage in the part of your body that drains pee from the bladder (urethra). ? A kidney stone. ? A nerve condition that affects your bladder (neurogenic bladder). ? Not getting enough to drink. ? Not peeing often enough.  You have other conditions, such as: ? Diabetes. ? A weak disease-fighting system (immune system). ? Sickle cell disease. ? Gout. ? Injury of the spine. What are the signs or symptoms? Symptoms of this condition include:  Needing to pee right away (urgently).  Peeing often.  Peeing small amounts often.  Pain or burning when peeing.  Blood in the pee.  Pee that smells bad or not like normal.  Trouble peeing.  Pee that is  cloudy.  Fluid coming from the vagina, if you are female.  Pain in the belly or lower back. Other symptoms include:  Throwing up (vomiting).  No urge to eat.  Feeling mixed up (confused).  Being tired and grouchy (irritable).  A fever.  Watery poop (diarrhea). How is this treated? This condition may be treated with:  Antibiotic medicine.  Other medicines.  Drinking enough water. Follow these instructions at home:  Medicines  Take over-the-counter and prescription medicines only as told by your doctor.  If you were prescribed an antibiotic medicine, take it as told by your doctor. Do not stop taking it even if you start to feel better. General instructions  Make sure you: ? Pee until your bladder is empty. ? Do not hold pee for a long time. ? Empty your bladder after sex. ? Wipe from front to back after pooping if you are a female. Use each tissue one time when you wipe.  Drink enough fluid to keep your pee pale yellow.  Keep all follow-up visits as told by your doctor. This is important. Contact a doctor if:  You do not get better after 1-2 days.  Your symptoms go away and then come back. Get help right away if:  You have very bad back pain.  You have very bad pain in your lower belly.  You have a fever.  You are sick to your stomach (nauseous).  You are throwing up. Summary  A urinary tract  infection (UTI) is an infection of any part of the urinary tract.  This condition is caused by germs in your genital area.  There are many risk factors for a UTI. These include having a small, thin tube to drain pee and not being able to control when you pee or poop.  Treatment includes antibiotic medicines for germs.  Drink enough fluid to keep your pee pale yellow. This information is not intended to replace advice given to you by your health care provider. Make sure you discuss any questions you have with your health care provider. Document Released:  10/24/2007 Document Revised: 11/14/2017 Document Reviewed: 11/14/2017 Elsevier Interactive Patient Education  2019 [employer].

## 2018-07-29 NOTE — Progress Notes (Signed)
ALIYAAH AHLIN is a 73 y.o. female with the following history as recorded in [employer]:  Patient Active Problem List   Diagnosis Date Noted  . Edema 04/21/2018  . Herpes zoster 02/20/2018  . Occipital neuralgia of left side 01/07/2018  . Hormone replacement therapy (HRT) 07/25/2017  . Hair loss 05/31/2017  . Upper respiratory infection 05/01/2017  . Achilles tendinitis 03/12/2017  . Gout 01/09/2017  . Shoulder effusion, right 12/28/2016  . Neck stiffness 12/28/2016  . Heel pain, chronic, left 12/11/2016  . Calcaneal bursitis (heel), left 11/13/2016  . Toenail avulsion, initial encounter 11/13/2016  . Traumatic hematoma of foot, right, sequela 09/27/2016  . Adnexal mass 01/04/2016  . Right adrenal mass (HCC) 12/29/2015  . Cataract 10/10/2015  . Abnormal abdominal x-ray 12/23/2014  . GERD (gastroesophageal reflux disease) 11/07/2012  . Hot flash, menopausal 09/10/2012  . Well adult exam 06/13/2011  . GAIT DISTURBANCE 11/22/2009  . PARESTHESIA 11/22/2009  . BACK PAIN, LUMBAR 11/03/2009  . Hypothyroidism 02/11/2007  . Dyslipidemia 02/11/2007  . Chronic anxiety 02/11/2007  . Depression 02/11/2007  . Essential hypertension 02/11/2007  . Coronary atherosclerosis 02/11/2007  . 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 CAROTID BRUIT 02/11/2007    Current Outpatient Medications  Medication Sig Dispense Refill  . aspirin 81 MG EC tablet Take 81 mg by mouth daily.      . Cholecalciferol 1000 UNITS tablet Take 1,000 Units by mouth daily.      . clorazepate (TRANXENE) 15 MG tablet TAKE 1 TABLET BY MOUTH THREE TIMES DAILY AS NEEDED FOR ANXIETY 270 tablet 1  . doxepin (SINEQUAN) 50 MG capsule Take 3 capsules (150 mg total) by mouth at bedtime. 270 capsule 3  . estradiol cypionate (DEPO-ESTRADIOL) 5 MG/ML injection Use 0.3 ml every 18-25 days IM 5 mL 3  . furosemide (LASIX) 40 MG tablet Take 1 tablet (40 mg total) by mouth 2 (two) times daily. 180 tablet 3  . levothyroxine (SYNTHROID,  LEVOTHROID) 137 MCG tablet Take 1 tablet (137 mcg total) by mouth daily before breakfast. 30 tablet 11  . LORazepam (ATIVAN) 0.5 MG tablet TAKE 1 TABLET BY MOUTH AT BEDTIME 90 tablet 1  . metoprolol tartrate (LOPRESSOR) 50 MG tablet Take 1 tablet (50 mg total) by mouth 3 (three) times daily. 270 tablet 3  . NIFEdipine (PROCARDIA-XL/ADALAT-CC/NIFEDICAL-XL) 30 MG 24 hr tablet Take 1 tablet (30 mg total) by mouth daily. 90 tablet 3  . nitroGLYCERIN (NITROLINGUAL) 0.4 MG/SPRAY spray PLACE 1 SPRAY UNDER THE TONGUE EVERY 5 MINUTES AS NEEDED FOR CHEST PAIN. MAY REPEAT 3 TIMES 12 g 1  . potassium chloride SA (KLOR-CON M20) 20 MEQ tablet Take 1 tablet (20 mEq total) by mouth 2 (two) times daily. 180 tablet 3  . azithromycin (ZITHROMAX) 250 MG tablet Take 2 pills today then 1 pill daily until complete (Patient not taking: Reported on 07/29/2018) 6 tablet 0  . nitrofurantoin, macrocrystal-monohydrate, (MACROBID) 100 MG capsule Take 1 capsule (100 mg total) by mouth 2 (two) times daily. 14 capsule 0   Current Facility-Administered Medications  Medication Dose Route Frequency Provider Last Rate Last Dose  . estradiol cypionate (DEPO-ESTRADIOL) 5 MG/ML injection 1.5 mg  1.5 mg Intramuscular Q30 days Plotnikov, Georgina Quint, MD   1.5 mg at 07/17/18 1516    Allergies: Epinephrine; Isosorbide mononitrate; Lidocaine; Morphine; Propoxyphene n-acetaminophen; Alprazolam; Atorvastatin; Codeine; Ezetimibe; Prilosec [omeprazole]; Rosuvastatin; Simvastatin; Allopurinol; Meloxicam; Tetracycline; Amoxicillin; Ciprofloxacin; and Diphenhydramine hcl  Past Medical History:  Diagnosis Date  . Anxiety state, unspecified   .  Bronchitis, acute   . Carotid bruit   . Coronary atherosclerosis of unspecified type of vessel, native or graft    multiple angioplasy procedures in 90's/early 2000's  . De Quervain's tenosynovitis   . Depressive disorder, not elsewhere classified   . Female bladder prolapse   . Lichen 2011   Vaginal  .  Other and unspecified hyperlipidemia   . Unspecified essential hypertension   . Unspecified hypothyroidism     History reviewed. No pertinent surgical history.  Family History  Problem Relation Age of Onset  . Hypertension Mother   . Hypertension Father   . Hypertension Other   . Coronary artery disease Other     Social History   Tobacco Use  . Smoking status: Former Smoker    Last attempt to quit: 07/22/1991    Years since quitting: 27.0  . Smokeless tobacco: Never Used  Substance Use Topics  . Alcohol use: No     Subjective:  Ms Radu is here today requesting evaluation of hematuria, loose stools, first noticed this yesterday morning. She reports multiple episodes of hematuria and loose, bowel movements since yesterday morning. I actually saw her yesterday for URI symptoms, she says she forgot to mention the blood in her urine and loose stool to me at yesterdays visit. She reports history of external hemorrhoids for years which frequently bleed or cause her pain, this has not seemed worse recently. She also has h/o cystocele, which often causes her vaginal discomfort, was seeing GYN in past for this but not in past few years. She denies fevers, chills, lightheadedness, weakness, confusion, sob, abdominal pain, back pain, flank pain, nausea, vomiting, dysuria, urinary frequency. Her BP is elevated, she says she feels very worried about the blood in her urine.   BP Readings from Last 3 Encounters:  07/29/18 (!) 188/98  07/28/18 140/88  07/17/18 134/82   ROS- See HPI  Objective:  Vitals:   07/29/18 1608 07/29/18 1657  BP: (!) 204/110 (!) 188/98  Pulse: 95   SpO2: 97%   Height: 5\' 8"  (1.727 m)     General: Well developed, well nourished, in no acute distress  Skin : Warm and dry.  Head: Normocephalic and atraumatic  Eyes: Sclera and conjunctiva clear; pupils round and reactive to light; extraocular movements intact  Oropharynx: Pink, supple. No suspicious lesions   Neck: Supple Lungs: Respirations unlabored; clear to auscultation bilaterally  CVS exam: normal rate and regular rhythm.  Abdomen: Soft; nontender; nondistended; normoactive bowel sounds; no masses or hepatosplenomegaly; no CVA tenderness  Genitourinary: cystocele, external hemorrhoids Extremities: No edema, cyanosis, clubbing  Vessels: Symmetric bilaterally  Neurologic: Alert and oriented; speech intact; face symmetrical; moves all extremities well; CNII-XII intact without focal deficit  Psychiatric: Pt appears anxious.  GU exam done with chaperone at bedside  Assessment:  1. Hematuria, unspecified type   2. Loose stools   3. Essential hypertension   4. Hemorrhoids, unspecified hemorrhoid type     Plan:   POCT urine abnormal today, with blood, protein, nitrites, leuks-visible gross blood noted in her urine Due to gross hematuria, stat CBC, CMET were done- results are stable Her BP is elevated today, but has been normal on recent past readings and she is very upset about the blood in her urine Considered ER referral, but she refuses, says she has 2 dogs at home to care for Based on urine testing, will start antibiotic course for possible UTI and send urine for culture Home management, red flags and strict  return precautions including when to seek immediate/emergency care discussed and printed on AVS F/U with further recommendations pending urine culture results RTC in 1 week for follow up- hematuria, hypertension- need to consider further hematuria workup if this persists AFTER VISIT- I have added stat urology referral, ct renal stone study, INR- telephone call sent to patient to inform additional tests, referrals  Return in about 1 week (around 08/05/2018).  Orders Placed This Encounter  Procedures  . Urine Culture    Standing Status:   Future    Number of Occurrences:   1    Standing Expiration Date:   08/29/2018  . CT RENAL STONE STUDY    Standing Status:   Future     Standing Expiration Date:   10/30/2019    Order Specific Question:   Preferred imaging location?    Answer:   [facility]    Order Specific Question:   Radiology Contrast Protocol - do NOT remove file path    Answer:   [email or web address]  . CBC    Standing Status:   Future    Number of Occurrences:   1    Standing Expiration Date:   07/29/2019  . Comprehensive metabolic panel    Standing Status:   Future    Number of Occurrences:   1    Standing Expiration Date:   07/29/2019  . INR/PT    Standing Status:   Future    Standing Expiration Date:   07/30/2019  . Ambulatory referral to Urology    Referral Priority:   Urgent    Referral Type:   Consultation    Referral Reason:   Specialty Services Required    Requested Specialty:   Urology    Number of Visits Requested:   1  . POCT urinalysis dipstick    Requested Prescriptions   Signed Prescriptions Disp Refills  . nitrofurantoin, macrocrystal-monohydrate, (MACROBID) 100 MG capsule 14 capsule 0    Sig: Take 1 capsule (100 mg total) by mouth 2 (two) times daily.

## 2018-07-30 ENCOUNTER — Telehealth: Payer: Self-pay | Admitting: Internal Medicine

## 2018-07-30 LAB — URINE CULTURE
MICRO NUMBER:: 299720
Result:: NO GROWTH
SPECIMEN QUALITY:: ADEQUATE

## 2018-07-30 NOTE — Telephone Encounter (Signed)
I am glad to hear she is feeling better I am still very concerned about the amount of blood we saw in her urine yesterday. I have ordered an urgent referral to urology, as well as a CT scan to make sure she does not have a kidney stone and additional lab work- she will be contacted about the referral and CT scan, please have her stop by our lab to have the additional lab work checked. Also, she should be taking the nitrofurantoin, not zithromax, for possible urinary infection Keep planned follow up with dr Posey Rea

## 2018-07-30 NOTE — Telephone Encounter (Signed)
Pt informed of below.  

## 2018-07-30 NOTE — Telephone Encounter (Signed)
Copied from CRM [id number]. Topic: Quick Communication - See Telephone Encounter >> Jul 30, 2018  1:20 PM Jolayne Haines L wrote: CRM for notification. See Telephone encounter for: 07/30/18.  Patient wanted to let Alphonse Guild know that she is feeling much better since taking azithromycin (ZITHROMAX) 250 MG tablet [[id number]] . She see's no blood in her urine today

## 2018-07-31 NOTE — Telephone Encounter (Signed)
Pt is currently taking  nitrofurantoin, macrocrystal-monohydrate, (MACROBID) 100 MG capsule [[id number]].  Pt states that when she ate lunch today her stomach became irritated and started to hurt. Pt would like to know if this is normal. Please advise

## 2018-07-31 NOTE — Telephone Encounter (Signed)
Spoke to pt regarding referrals.   She is having some diarrhea that she thinks is due to the antibiotic. Wanted to let someone know and be sure she can keep taking it.   Also, she's wondering if she should come in for the updated lab work now or wait till she's finished with antibiotic.   Please call pt

## 2018-07-31 NOTE — Telephone Encounter (Signed)
Yes she can go ahead with lab work

## 2018-08-01 ENCOUNTER — Other Ambulatory Visit (INDEPENDENT_AMBULATORY_CARE_PROVIDER_SITE_OTHER): Payer: Medicare Other

## 2018-08-01 ENCOUNTER — Ambulatory Visit (INDEPENDENT_AMBULATORY_CARE_PROVIDER_SITE_OTHER)
Admission: RE | Admit: 2018-08-01 | Discharge: 2018-08-01 | Disposition: A | Discharge status: Home / Self Care | Payer: Medicare Other | Source: Ambulatory Visit | Attending: Nurse Practitioner | Admitting: Nurse Practitioner

## 2018-08-01 ENCOUNTER — Other Ambulatory Visit: Payer: Self-pay

## 2018-08-01 DIAGNOSIS — R319 Hematuria, unspecified: Secondary | ICD-10-CM

## 2018-08-01 DIAGNOSIS — N210 Calculus in bladder: Secondary | ICD-10-CM | POA: Diagnosis not present

## 2018-08-01 LAB — PROTIME-INR
INR: 1 ratio (ref 0.8–1.0)
Prothrombin Time: 11.6 s (ref 9.6–13.1)

## 2018-08-01 NOTE — Telephone Encounter (Addendum)
Pt informed of below.  See 07/29/18 Urine culture results. Pt informed to stop nitrofurantoin per Alphonse Guild, NP.

## 2018-08-01 NOTE — Telephone Encounter (Signed)
Pt states she got a call to schedule appt with Dr Posey Rea.  Pt scheduled 3/18. If that is ok. Pt has lab appt and CT today.

## 2018-08-01 NOTE — Telephone Encounter (Signed)
Routing to [name], please advise in the absence of ashleigh, I will call patient back, thanks

## 2018-08-01 NOTE — Telephone Encounter (Signed)
Noted, thank you

## 2018-08-06 ENCOUNTER — Encounter: Payer: Self-pay | Admitting: Internal Medicine

## 2018-08-06 ENCOUNTER — Other Ambulatory Visit: Payer: Self-pay

## 2018-08-06 ENCOUNTER — Ambulatory Visit (INDEPENDENT_AMBULATORY_CARE_PROVIDER_SITE_OTHER): Payer: Medicare Other | Admitting: Internal Medicine

## 2018-08-06 DIAGNOSIS — R310 Gross hematuria: Secondary | ICD-10-CM | POA: Diagnosis not present

## 2018-08-06 DIAGNOSIS — F419 Anxiety disorder, unspecified: Secondary | ICD-10-CM

## 2018-08-06 DIAGNOSIS — I10 Essential (primary) hypertension: Secondary | ICD-10-CM

## 2018-08-06 DIAGNOSIS — N210 Calculus in bladder: Secondary | ICD-10-CM

## 2018-08-06 NOTE — Assessment & Plan Note (Signed)
Abd CT w/o contrast  IMPRESSION: 1. No acute noncontrast CT findings of the abdomen or pelvis to explain hematuria or loose stools. 2. Multiple small calculi within the dependent urinary bladder, similar to prior study. Electronically Signed   By: Lauralyn Primes M.D.   On: 08/01/2018 15:42

## 2018-08-06 NOTE — Progress Notes (Signed)
Subjective:  Patient ID: [name], female    DOB: 1945/09/17  Age: 73 y.o. MRN: [id number]  CC: No chief complaint on file.   HPI LISI CARVELLI presents for hematuria f/u  Outpatient Medications Prior to Visit  Medication Sig Dispense Refill  . aspirin 81 MG EC tablet Take 81 mg by mouth daily.      . Cholecalciferol 1000 UNITS tablet Take 1,000 Units by mouth daily.      . clorazepate (TRANXENE) 15 MG tablet TAKE 1 TABLET BY MOUTH THREE TIMES DAILY AS NEEDED FOR ANXIETY 270 tablet 1  . doxepin (SINEQUAN) 50 MG capsule Take 3 capsules (150 mg total) by mouth at bedtime. 270 capsule 3  . estradiol cypionate (DEPO-ESTRADIOL) 5 MG/ML injection Use 0.3 ml every 18-25 days IM 5 mL 3  . furosemide (LASIX) 40 MG tablet Take 1 tablet (40 mg total) by mouth 2 (two) times daily. 180 tablet 3  . levothyroxine (SYNTHROID, LEVOTHROID) 137 MCG tablet Take 1 tablet (137 mcg total) by mouth daily before breakfast. 30 tablet 11  . LORazepam (ATIVAN) 0.5 MG tablet TAKE 1 TABLET BY MOUTH AT BEDTIME 90 tablet 1  . metoprolol tartrate (LOPRESSOR) 50 MG tablet Take 1 tablet (50 mg total) by mouth 3 (three) times daily. 270 tablet 3  . NIFEdipine (PROCARDIA-XL/ADALAT-CC/NIFEDICAL-XL) 30 MG 24 hr tablet Take 1 tablet (30 mg total) by mouth daily. 90 tablet 3  . nitroGLYCERIN (NITROLINGUAL) 0.4 MG/SPRAY spray PLACE 1 SPRAY UNDER THE TONGUE EVERY 5 MINUTES AS NEEDED FOR CHEST PAIN. MAY REPEAT 3 TIMES 12 g 1  . potassium chloride SA (KLOR-CON M20) 20 MEQ tablet Take 1 tablet (20 mEq total) by mouth 2 (two) times daily. 180 tablet 3  . azithromycin (ZITHROMAX) 250 MG tablet Take 2 pills today then 1 pill daily until complete (Patient not taking: Reported on 07/29/2018) 6 tablet 0  . nitrofurantoin, macrocrystal-monohydrate, (MACROBID) 100 MG capsule Take 1 capsule (100 mg total) by mouth 2 (two) times daily. 14 capsule 0   Facility-Administered Medications Prior to Visit  Medication Dose Route Frequency Provider  Last Rate Last Dose  . estradiol cypionate (DEPO-ESTRADIOL) 5 MG/ML injection 1.5 mg  1.5 mg Intramuscular Q30 days Plotnikov, Georgina Quint, MD   1.5 mg at 07/17/18 1516    ROS: Review of Systems  Constitutional: Negative for activity change, appetite change, chills, fatigue and unexpected weight change.  HENT: Negative for congestion, mouth sores and sinus pressure.   Eyes: Negative for visual disturbance.  Respiratory: Negative for cough and chest tightness.   Gastrointestinal: Negative for abdominal pain and nausea.  Genitourinary: Positive for hematuria. Negative for difficulty urinating, frequency and vaginal pain.  Musculoskeletal: Negative for back pain and gait problem.  Skin: Negative for pallor and rash.  Neurological: Negative for dizziness, tremors, weakness, numbness and headaches.  Psychiatric/Behavioral: Negative for confusion and sleep disturbance.    Objective:  BP (!) 144/84 (BP Location: Left Arm, Patient Position: Sitting, Cuff Size: Normal)   Pulse 73   Temp 97.9 F (36.6 C) (Oral)   Ht 5\' 8"  (1.727 m)   Wt 166 lb (75.3 kg)   SpO2 96%   BMI 25.24 kg/m   BP Readings from Last 3 Encounters:  08/06/18 (!) 144/84  07/29/18 (!) 188/98  07/28/18 140/88    Wt Readings from Last 3 Encounters:  08/06/18 166 lb (75.3 kg)  07/17/18 167 lb (75.8 kg)  05/29/18 166 lb (75.3 kg)    Physical Exam Constitutional:  General: She is not in acute distress.    Appearance: She is well-developed.  HENT:     Head: Normocephalic.     Right Ear: External ear normal.     Left Ear: External ear normal.     Nose: Nose normal.  Eyes:     General:        Right eye: No discharge.        Left eye: No discharge.     Conjunctiva/sclera: Conjunctivae normal.     Pupils: Pupils are equal, round, and reactive to light.  Neck:     Musculoskeletal: Normal range of motion and neck supple.     Thyroid: No thyromegaly.     Vascular: No JVD.     Trachea: No tracheal deviation.   Cardiovascular:     Rate and Rhythm: Normal rate and regular rhythm.     Heart sounds: Normal heart sounds.  Pulmonary:     Effort: No respiratory distress.     Breath sounds: No stridor. No wheezing.  Abdominal:     General: Bowel sounds are normal. There is no distension.     Palpations: Abdomen is soft. There is no mass.     Tenderness: There is no abdominal tenderness. There is no guarding or rebound.  Musculoskeletal:        General: No tenderness.  Lymphadenopathy:     Cervical: No cervical adenopathy.  Skin:    Findings: No erythema or rash.  Neurological:     Cranial Nerves: No cranial nerve deficit.     Motor: No abnormal muscle tone.     Coordination: Coordination normal.     Deep Tendon Reflexes: Reflexes normal.  Psychiatric:        Behavior: Behavior normal.        Thought Content: Thought content normal.        Judgment: Judgment normal.     Lab Results  Component Value Date   WBC 4.8 07/29/2018   HGB 12.4 07/29/2018   HCT 36.9 07/29/2018   PLT 239.0 07/29/2018   GLUCOSE 91 07/29/2018   CHOL 236 (H) 11/02/2016   TRIG 100.0 11/02/2016   HDL 60.60 11/02/2016   LDLDIRECT 188.2 01/10/2012   LDLCALC 155 (H) 11/02/2016   ALT 14 07/29/2018   AST 19 07/29/2018   NA 139 07/29/2018   K 3.8 07/29/2018   CL 103 07/29/2018   CREATININE 1.11 07/29/2018   BUN 14 07/29/2018   CO2 26 07/29/2018   TSH 0.15 (L) 07/17/2018   INR 1.0 08/01/2018    Ct Renal Stone Study  Result Date: 08/01/2018 CLINICAL DATA:  Hematuria, loose stools EXAM: CT ABDOMEN AND PELVIS WITHOUT CONTRAST TECHNIQUE: Multidetector CT imaging of the abdomen and pelvis was performed following the standard protocol without IV contrast. COMPARISON:  12/24/2014 FINDINGS: Lower chest: No acute abnormality. Coronary artery calcifications. Hepatobiliary: No focal liver abnormality is seen. No gallstones, gallbladder wall thickening, or biliary dilatation. Pancreas: Unremarkable. No pancreatic ductal  dilatation or surrounding inflammatory changes. Spleen: Normal in size without focal abnormality. Adrenals/Urinary Tract: Adrenal glands are unremarkable. Kidneys are normal, without renal calculi, focal lesion, or hydronephrosis. Multiple small calculi in the dependent urinary bladder as seen on prior examination. Stomach/Bowel: Stomach is within normal limits. Appendix appears normal. No evidence of bowel wall thickening, distention, or inflammatory changes. Vascular/Lymphatic: Calcific atherosclerosis. No enlarged abdominal or pelvic lymph nodes. Reproductive: No mass or other abnormality. Status post hysterectomy. Other: No abdominal wall hernia or abnormality. No abdominopelvic ascites. Musculoskeletal:  No acute or significant osseous findings. IMPRESSION: 1. No acute noncontrast CT findings of the abdomen or pelvis to explain hematuria or loose stools. 2. Multiple small calculi within the dependent urinary bladder, similar to prior study. Electronically Signed   By: Lauralyn Primes M.D.   On: 08/01/2018 15:42    Assessment & Plan:   There are no diagnoses linked to this encounter.   No orders of the defined types were placed in this encounter.    Follow-up: No follow-ups on file.  Sonda Primes, MD

## 2018-08-06 NOTE — Assessment & Plan Note (Signed)
Abd CT w/o contrast  IMPRESSION: 1. No acute noncontrast CT findings of the abdomen or pelvis to explain hematuria or loose stools. 2. Multiple small calculi within the dependent urinary bladder, similar to prior study. Electronically Signed   By: Alex  Bibbey M.D.   On: 08/01/2018 15:42 

## 2018-08-06 NOTE — Assessment & Plan Note (Signed)
Discussed hematuria

## 2018-08-06 NOTE — Assessment & Plan Note (Signed)
Toprol, Nifedipine 

## 2018-08-07 DIAGNOSIS — R310 Gross hematuria: Secondary | ICD-10-CM | POA: Diagnosis not present

## 2018-08-12 ENCOUNTER — Other Ambulatory Visit: Payer: Self-pay | Admitting: Internal Medicine

## 2018-08-21 ENCOUNTER — Ambulatory Visit: Payer: Medicare Other | Admitting: Internal Medicine

## 2018-08-25 ENCOUNTER — Encounter: Payer: Self-pay | Admitting: Internal Medicine

## 2018-08-25 ENCOUNTER — Other Ambulatory Visit: Payer: Self-pay

## 2018-08-25 ENCOUNTER — Ambulatory Visit (INDEPENDENT_AMBULATORY_CARE_PROVIDER_SITE_OTHER): Payer: Medicare Other | Admitting: Internal Medicine

## 2018-08-25 VITALS — BP 138/86 | HR 72 | Temp 97.7°F | Ht 68.0 in | Wt 163.0 lb

## 2018-08-25 DIAGNOSIS — N951 Menopausal and female climacteric states: Secondary | ICD-10-CM

## 2018-08-25 DIAGNOSIS — I2510 Atherosclerotic heart disease of native coronary artery without angina pectoris: Secondary | ICD-10-CM | POA: Diagnosis not present

## 2018-08-25 DIAGNOSIS — F419 Anxiety disorder, unspecified: Secondary | ICD-10-CM | POA: Diagnosis not present

## 2018-08-25 DIAGNOSIS — M10072 Idiopathic gout, left ankle and foot: Secondary | ICD-10-CM

## 2018-08-25 DIAGNOSIS — I10 Essential (primary) hypertension: Secondary | ICD-10-CM | POA: Diagnosis not present

## 2018-08-25 MED ORDER — CLORAZEPATE DIPOTASSIUM 15 MG PO TABS: 15.0000 mg | ORAL_TABLET | Freq: Three times a day (TID) | ORAL | 1 refills | Status: DC | PRN

## 2018-08-25 MED ORDER — LORAZEPAM 0.5 MG PO TABS: 0.5000 mg | ORAL_TABLET | Freq: Every day | ORAL | 1 refills | Status: DC

## 2018-08-25 MED ORDER — NIFEDIPINE ER OSMOTIC RELEASE 30 MG PO TB24: 30.0000 mg | ORAL_TABLET | Freq: Every day | ORAL | 3 refills | Status: DC

## 2018-08-25 MED ORDER — POTASSIUM CHLORIDE CRYS ER 20 MEQ PO TBCR: 20.0000 meq | EXTENDED_RELEASE_TABLET | Freq: Two times a day (BID) | ORAL | 3 refills | Status: DC

## 2018-08-25 MED ORDER — METOPROLOL TARTRATE 50 MG PO TABS: 50.0000 mg | ORAL_TABLET | Freq: Three times a day (TID) | ORAL | 3 refills | Status: DC

## 2018-08-25 MED ORDER — DOXEPIN HCL 50 MG PO CAPS: 150.0000 mg | ORAL_CAPSULE | Freq: Every day | ORAL | 3 refills | Status: DC

## 2018-08-25 MED ORDER — METHYLPREDNISOLONE ACETATE 80 MG/ML IJ SUSP
80.0000 mg | Freq: Once | INTRAMUSCULAR | Status: AC
  Administered 2018-08-25: 80 mg via INTRAMUSCULAR

## 2018-08-25 MED ORDER — FUROSEMIDE 40 MG PO TABS: 40.0000 mg | ORAL_TABLET | Freq: Two times a day (BID) | ORAL | 3 refills | Status: DC

## 2018-08-25 MED ORDER — CEPHALEXIN 500 MG PO CAPS: 500.0000 mg | ORAL_CAPSULE | Freq: Four times a day (QID) | ORAL | 0 refills | Status: DC

## 2018-08-25 MED ORDER — NITROGLYCERIN 0.4 MG/SPRAY TL SOLN: 1 refills | Status: DC

## 2018-08-25 NOTE — Addendum Note (Signed)
Addended by: Scarlett Presto on: 08/25/2018 03:46 PM   Modules accepted: Orders

## 2018-08-25 NOTE — Assessment & Plan Note (Signed)
Chronic. No angina Toprol, Procardia

## 2018-08-25 NOTE — Assessment & Plan Note (Signed)
A flare up 4/20 Depomedrol 80 mg IM

## 2018-08-25 NOTE — Progress Notes (Signed)
Subjective:  Patient ID: [name], female    DOB: 13-Sep-1945  Age: 73 y.o. MRN: [id number]  CC: No chief complaint on file.   HPI [name] presents for CAD, hypothyroidism, anxiety C/o R foot pain since Sat, worse w/ROM Pain is 6-7/10   Outpatient Medications Prior to Visit  Medication Sig Dispense Refill  . aspirin 81 MG EC tablet Take 81 mg by mouth daily.      . Cholecalciferol 1000 UNITS tablet Take 1,000 Units by mouth daily.      . clorazepate (TRANXENE) 15 MG tablet TAKE 1 TABLET BY MOUTH THREE TIMES DAILY AS NEEDED FOR ANXIETY 270 tablet 1  . doxepin (SINEQUAN) 50 MG capsule Take 3 capsules (150 mg total) by mouth at bedtime. 270 capsule 3  . estradiol cypionate (DEPO-ESTRADIOL) 5 MG/ML injection Use 0.3 ml every 18-25 days IM 5 mL 3  . furosemide (LASIX) 40 MG tablet Take 1 tablet (40 mg total) by mouth 2 (two) times daily. 180 tablet 3  . levothyroxine (SYNTHROID, LEVOTHROID) 137 MCG tablet Take 1 tablet (137 mcg total) by mouth daily before breakfast. 30 tablet 11  . LORazepam (ATIVAN) 0.5 MG tablet TAKE 1 TABLET BY MOUTH AT BEDTIME 90 tablet 1  . metoprolol tartrate (LOPRESSOR) 50 MG tablet Take 1 tablet (50 mg total) by mouth 3 (three) times daily. 270 tablet 3  . NIFEdipine (PROCARDIA-XL/NIFEDICAL-XL) 30 MG 24 hr tablet Take 1 tablet by mouth once daily 90 tablet 0  . nitroGLYCERIN (NITROLINGUAL) 0.4 MG/SPRAY spray PLACE 1 SPRAY UNDER THE TONGUE EVERY 5 MINUTES AS NEEDED FOR CHEST PAIN. MAY REPEAT 3 TIMES 12 g 1  . potassium chloride SA (KLOR-CON M20) 20 MEQ tablet Take 1 tablet (20 mEq total) by mouth 2 (two) times daily. 180 tablet 3   Facility-Administered Medications Prior to Visit  Medication Dose Route Frequency Provider Last Rate Last Dose  . estradiol cypionate (DEPO-ESTRADIOL) 5 MG/ML injection 1.5 mg  1.5 mg Intramuscular Q30 days Plotnikov, Georgina Quint, MD   1.5 mg at 07/17/18 1516    ROS: Review of Systems  Constitutional: Negative for activity  change, appetite change, chills, diaphoresis, fatigue, fever and unexpected weight change.  HENT: Negative for congestion, dental problem, ear pain, hearing loss, mouth sores, postnasal drip, sinus pressure, sneezing, sore throat and voice change.   Eyes: Negative for pain and visual disturbance.  Respiratory: Negative for cough, chest tightness, wheezing and stridor.   Cardiovascular: Negative for chest pain, palpitations and leg swelling.  Gastrointestinal: Negative for abdominal distention, abdominal pain, blood in stool, nausea, rectal pain and vomiting.  Genitourinary: Negative for decreased urine volume, difficulty urinating, dysuria, frequency, hematuria, menstrual problem, vaginal bleeding, vaginal discharge and vaginal pain.  Musculoskeletal: Positive for arthralgias and joint swelling. Negative for back pain, gait problem and neck pain.  Skin: Positive for color change. Negative for rash and wound.  Neurological: Negative for dizziness, tremors, syncope, speech difficulty, weakness and light-headedness.  Hematological: Negative for adenopathy.  Psychiatric/Behavioral: Negative for behavioral problems, confusion, decreased concentration, dysphoric mood, hallucinations, sleep disturbance and suicidal ideas. The patient is nervous/anxious. The patient is not hyperactive.     Objective:  BP 138/86 (BP Location: Left Arm, Patient Position: Sitting, Cuff Size: Normal)   Pulse 72   Temp 97.7 F (36.5 C) (Oral)   Ht  (1.727 m)   Wt 163 lb (73.9 kg)   SpO2 97%   BMI 24.78 kg/m   BP Readings from Last 3 Encounters:  08/25/18 138/86  08/06/18 (!) 144/84  07/29/18 (!) 188/98    Wt Readings from Last 3 Encounters:  08/25/18 163 lb (73.9 kg)  08/06/18 166 lb (75.3 kg)  07/17/18 167 lb (75.8 kg)    Physical Exam Constitutional:      General: She is not in acute distress.    Appearance: She is well-developed.  HENT:     Head: Normocephalic.     Right Ear: External ear  normal.     Left Ear: External ear normal.     Nose: Nose normal.  Eyes:     General:        Right eye: No discharge.        Left eye: No discharge.     Conjunctiva/sclera: Conjunctivae normal.     Pupils: Pupils are equal, round, and reactive to light.  Neck:     Musculoskeletal: Normal range of motion and neck supple.     Thyroid: No thyromegaly.     Vascular: No JVD.     Trachea: No tracheal deviation.  Cardiovascular:     Rate and Rhythm: Normal rate and regular rhythm.     Heart sounds: Normal heart sounds.  Pulmonary:     Effort: No respiratory distress.     Breath sounds: No stridor. No wheezing.  Abdominal:     General: Bowel sounds are normal. There is no distension.     Palpations: Abdomen is soft. There is no mass.     Tenderness: There is no abdominal tenderness. There is no guarding or rebound.  Musculoskeletal:        General: Tenderness present.  Lymphadenopathy:     Cervical: No cervical adenopathy.  Skin:    Findings: Erythema present. No rash.  Neurological:     Cranial Nerves: No cranial nerve deficit.     Motor: No abnormal muscle tone.     Coordination: Coordination normal.     Deep Tendon Reflexes: Reflexes normal.  Psychiatric:        Behavior: Behavior normal.        Thought Content: Thought content normal.        Judgment: Judgment normal.   R ankle - pain w/ROM/swelling Mid erythema - R lat foot  Lab Results  Component Value Date   WBC 4.8 07/29/2018   HGB 12.4 07/29/2018   HCT 36.9 07/29/2018   PLT 239.0 07/29/2018   GLUCOSE 91 07/29/2018   CHOL 236 (H) 11/02/2016   TRIG 100.0 11/02/2016   HDL 60.60 11/02/2016   LDLDIRECT 188.2 01/10/2012   LDLCALC 155 (H) 11/02/2016   ALT 14 07/29/2018   AST 19 07/29/2018   NA 139 07/29/2018   K 3.8 07/29/2018   CL 103 07/29/2018   CREATININE 1.11 07/29/2018   BUN 14 07/29/2018   CO2 26 07/29/2018   TSH 0.15 (L) 07/17/2018   INR 1.0 08/01/2018    Ct Renal Stone Study  Result Date:  08/01/2018 CLINICAL DATA:  Hematuria, loose stools EXAM: CT ABDOMEN AND PELVIS WITHOUT CONTRAST TECHNIQUE: Multidetector CT imaging of the abdomen and pelvis was performed following the standard protocol without IV contrast. COMPARISON:  12/24/2014 FINDINGS: Lower chest: No acute abnormality. Coronary artery calcifications. Hepatobiliary: No focal liver abnormality is seen. No gallstones, gallbladder wall thickening, or biliary dilatation. Pancreas: Unremarkable. No pancreatic ductal dilatation or surrounding inflammatory changes. Spleen: Normal in size without focal abnormality. Adrenals/Urinary Tract: Adrenal glands are unremarkable. Kidneys are normal, without renal calculi, focal lesion, or hydronephrosis. Multiple small calculi in  the dependent urinary bladder as seen on prior examination. Stomach/Bowel: Stomach is within normal limits. Appendix appears normal. No evidence of bowel wall thickening, distention, or inflammatory changes. Vascular/Lymphatic: Calcific atherosclerosis. No enlarged abdominal or pelvic lymph nodes. Reproductive: No mass or other abnormality. Status post hysterectomy. Other: No abdominal wall hernia or abnormality. No abdominopelvic ascites. Musculoskeletal: No acute or significant osseous findings. IMPRESSION: 1. No acute noncontrast CT findings of the abdomen or pelvis to explain hematuria or loose stools. 2. Multiple small calculi within the dependent urinary bladder, similar to prior study. Electronically Signed   By: Lauralyn Primes M.D.   On: 08/01/2018 15:42    Assessment & Plan:   There are no diagnoses linked to this encounter.   No orders of the defined types were placed in this encounter.    Follow-up: No follow-ups on file.  Sonda Primes, MD

## 2018-08-25 NOTE — Assessment & Plan Note (Signed)
Depo-Estradiol 0.3 ml 

## 2018-08-25 NOTE — Assessment & Plan Note (Signed)
Toprol, Nifedipine 

## 2018-08-25 NOTE — Assessment & Plan Note (Signed)
Lorazepam prn, Doxepine at hs Pt taking meds at night (not when driving)  Potential benefits of a long term benzodiazepines  use as well as potential risks  and complications were explained to the patient and were aknowledged. 

## 2018-08-27 ENCOUNTER — Other Ambulatory Visit: Payer: Self-pay | Admitting: Internal Medicine

## 2018-08-27 MED ORDER — METHYLPREDNISOLONE 4 MG PO TBPK: ORAL_TABLET | ORAL | 0 refills | Status: DC

## 2018-09-04 ENCOUNTER — Other Ambulatory Visit: Payer: Self-pay | Admitting: Internal Medicine

## 2018-09-04 MED ORDER — METHYLPREDNISOLONE 4 MG PO TBPK: ORAL_TABLET | ORAL | 0 refills | Status: DC

## 2018-09-10 ENCOUNTER — Other Ambulatory Visit: Payer: Self-pay | Admitting: Internal Medicine

## 2018-09-10 MED ORDER — FLUCONAZOLE 150 MG PO TABS: 150.0000 mg | ORAL_TABLET | Freq: Once | ORAL | 1 refills | Status: AC

## 2018-09-15 ENCOUNTER — Other Ambulatory Visit (INDEPENDENT_AMBULATORY_CARE_PROVIDER_SITE_OTHER): Payer: Medicare Other

## 2018-09-15 ENCOUNTER — Other Ambulatory Visit: Payer: Self-pay | Admitting: Internal Medicine

## 2018-09-15 DIAGNOSIS — R319 Hematuria, unspecified: Secondary | ICD-10-CM

## 2018-09-15 LAB — URINALYSIS, ROUTINE W REFLEX MICROSCOPIC
Bilirubin Urine: NEGATIVE
Ketones, ur: NEGATIVE
Nitrite: POSITIVE — AB
Specific Gravity, Urine: 1.02 (ref 1.000–1.030)
Total Protein, Urine: NEGATIVE
Urine Glucose: NEGATIVE
Urobilinogen, UA: 0.2 (ref 0.0–1.0)
pH: 5.5 (ref 5.0–8.0)

## 2018-09-15 MED ORDER — NITROFURANTOIN MONOHYD MACRO 100 MG PO CAPS: 100.0000 mg | ORAL_CAPSULE | Freq: Two times a day (BID) | ORAL | 0 refills | Status: DC

## 2018-09-16 ENCOUNTER — Other Ambulatory Visit: Payer: Self-pay | Admitting: Internal Medicine

## 2018-09-16 MED ORDER — FLUCONAZOLE 150 MG PO TABS: 150.0000 mg | ORAL_TABLET | Freq: Once | ORAL | 1 refills | Status: AC

## 2018-09-24 ENCOUNTER — Encounter: Payer: Self-pay | Admitting: Internal Medicine

## 2018-09-24 ENCOUNTER — Other Ambulatory Visit: Payer: Self-pay

## 2018-09-24 ENCOUNTER — Ambulatory Visit: Payer: Medicare Other

## 2018-09-24 ENCOUNTER — Ambulatory Visit (INDEPENDENT_AMBULATORY_CARE_PROVIDER_SITE_OTHER): Payer: Medicare Other | Admitting: Internal Medicine

## 2018-09-24 DIAGNOSIS — K2100 Gastro-esophageal reflux disease with esophagitis, without bleeding: Secondary | ICD-10-CM

## 2018-09-24 DIAGNOSIS — E034 Atrophy of thyroid (acquired): Secondary | ICD-10-CM

## 2018-09-24 DIAGNOSIS — F419 Anxiety disorder, unspecified: Secondary | ICD-10-CM | POA: Diagnosis not present

## 2018-09-24 DIAGNOSIS — M10072 Idiopathic gout, left ankle and foot: Secondary | ICD-10-CM

## 2018-09-24 DIAGNOSIS — I10 Essential (primary) hypertension: Secondary | ICD-10-CM | POA: Diagnosis not present

## 2018-09-24 DIAGNOSIS — N951 Menopausal and female climacteric states: Secondary | ICD-10-CM

## 2018-09-24 NOTE — Assessment & Plan Note (Signed)
Levothroid 

## 2018-09-24 NOTE — Assessment & Plan Note (Signed)
Doing well 

## 2018-09-24 NOTE — Assessment & Plan Note (Signed)
Toprol, Nifedipine 

## 2018-09-24 NOTE — Progress Notes (Signed)
Subjective:  Patient ID: [name], female    DOB: Mar 20, 1946  Age: 73 y.o. MRN: [id number]  CC: No chief complaint on file.   HPI [name] presents for UTI, OA, gout f/u F/u menopause/sweats  Outpatient Medications Prior to Visit  Medication Sig Dispense Refill  . aspirin 81 MG EC tablet Take 81 mg by mouth daily.      . Cholecalciferol 1000 UNITS tablet Take 1,000 Units by mouth daily.      . clorazepate (TRANXENE) 15 MG tablet Take 1 tablet (15 mg total) by mouth 3 (three) times daily as needed. for anxiety 270 tablet 1  . doxepin (SINEQUAN) 50 MG capsule Take 3 capsules (150 mg total) by mouth at bedtime. 270 capsule 3  . estradiol cypionate (DEPO-ESTRADIOL) 5 MG/ML injection Use 0.3 ml every 18-25 days IM 5 mL 3  . furosemide (LASIX) 40 MG tablet Take 1 tablet (40 mg total) by mouth 2 (two) times daily. 180 tablet 3  . levothyroxine (SYNTHROID, LEVOTHROID) 137 MCG tablet Take 1 tablet (137 mcg total) by mouth daily before breakfast. 30 tablet 11  . LORazepam (ATIVAN) 0.5 MG tablet Take 1 tablet (0.5 mg total) by mouth at bedtime. 90 tablet 1  . methylPREDNISolone (MEDROL DOSEPAK) 4 MG TBPK tablet As directed 21 tablet 0  . NIFEdipine (PROCARDIA-XL/NIFEDICAL-XL) 30 MG 24 hr tablet Take 1 tablet (30 mg total) by mouth daily. 90 tablet 3  . nitroGLYCERIN (NITROLINGUAL) 0.4 MG/SPRAY spray PLACE 1 SPRAY UNDER THE TONGUE EVERY 5 MINUTES AS NEEDED FOR CHEST PAIN. MAY REPEAT 3 TIMES 12 g 1  . potassium chloride SA (KLOR-CON M20) 20 MEQ tablet Take 1 tablet (20 mEq total) by mouth 2 (two) times daily. 180 tablet 3  . cephALEXin (KEFLEX) 500 MG capsule Take 1 capsule (500 mg total) by mouth 4 (four) times daily. (Patient not taking: Reported on 09/24/2018) 40 capsule 0  . metoprolol tartrate (LOPRESSOR) 50 MG tablet Take 1 tablet (50 mg total) by mouth 3 (three) times daily. (Patient not taking: Reported on 09/24/2018) 270 tablet 3   Facility-Administered Medications Prior to Visit   Medication Dose Route Frequency Provider Last Rate Last Dose  . estradiol cypionate (DEPO-ESTRADIOL) 5 MG/ML injection 1.5 mg  1.5 mg Intramuscular Q30 days Plotnikov, Georgina Quint, MD   1.5 mg at 08/25/18 1545    ROS: Review of Systems  Constitutional: Negative for activity change, appetite change, chills, fatigue and unexpected weight change.  HENT: Negative for congestion, mouth sores and sinus pressure.   Eyes: Negative for visual disturbance.  Respiratory: Negative for cough and chest tightness.   Gastrointestinal: Negative for abdominal pain and nausea.  Genitourinary: Negative for difficulty urinating, frequency and vaginal pain.  Musculoskeletal: Positive for arthralgias. Negative for back pain and gait problem.  Skin: Negative for pallor and rash.  Neurological: Negative for dizziness, tremors, weakness, numbness and headaches.  Psychiatric/Behavioral: Negative for confusion, sleep disturbance and suicidal ideas. The patient is nervous/anxious.     Objective:  BP (!) 142/80 (BP Location: Left Arm, Patient Position: Sitting, Cuff Size: Normal)   Pulse 79   Temp (!) 97.5 F (36.4 C) (Oral)   Ht 5\' 8"  (1.727 m)   Wt 164 lb (74.4 kg)   SpO2 95%   BMI 24.94 kg/m   BP Readings from Last 3 Encounters:  09/24/18 (!) 142/80  08/25/18 138/86  08/06/18 (!) 144/84    Wt Readings from Last 3 Encounters:  09/24/18 164 lb (74.4 kg)  08/25/18 163 lb (73.9 kg)  08/06/18 166 lb (75.3 kg)    Physical Exam Constitutional:      General: She is not in acute distress.    Appearance: She is well-developed.  HENT:     Head: Normocephalic.     Right Ear: External ear normal.     Left Ear: External ear normal.     Nose: Nose normal.  Eyes:     General:        Right eye: No discharge.        Left eye: No discharge.     Conjunctiva/sclera: Conjunctivae normal.     Pupils: Pupils are equal, round, and reactive to light.  Neck:     Musculoskeletal: Normal range of motion and neck  supple.     Thyroid: No thyromegaly.     Vascular: No JVD.     Trachea: No tracheal deviation.  Cardiovascular:     Rate and Rhythm: Normal rate and regular rhythm.     Heart sounds: Normal heart sounds.  Pulmonary:     Effort: No respiratory distress.     Breath sounds: No stridor. No wheezing.  Abdominal:     General: Bowel sounds are normal. There is no distension.     Palpations: Abdomen is soft. There is no mass.     Tenderness: There is no abdominal tenderness. There is no guarding or rebound.  Musculoskeletal:        General: No tenderness.  Lymphadenopathy:     Cervical: No cervical adenopathy.  Skin:    Findings: No erythema or rash.  Neurological:     Mental Status: She is oriented to person, place, and time.     Cranial Nerves: No cranial nerve deficit.     Motor: Weakness present. No abnormal muscle tone.     Coordination: Coordination normal.     Deep Tendon Reflexes: Reflexes normal.  Psychiatric:        Behavior: Behavior normal.        Thought Content: Thought content normal.        Judgment: Judgment normal.     Lab Results  Component Value Date   WBC 4.8 07/29/2018   HGB 12.4 07/29/2018   HCT 36.9 07/29/2018   PLT 239.0 07/29/2018   GLUCOSE 91 07/29/2018   CHOL 236 (H) 11/02/2016   TRIG 100.0 11/02/2016   HDL 60.60 11/02/2016   LDLDIRECT 188.2 01/10/2012   LDLCALC 155 (H) 11/02/2016   ALT 14 07/29/2018   AST 19 07/29/2018   NA 139 07/29/2018   K 3.8 07/29/2018   CL 103 07/29/2018   CREATININE 1.11 07/29/2018   BUN 14 07/29/2018   CO2 26 07/29/2018   TSH 0.15 (L) 07/17/2018   INR 1.0 08/01/2018    Ct Renal Stone Study  Result Date: 08/01/2018 CLINICAL DATA:  Hematuria, loose stools EXAM: CT ABDOMEN AND PELVIS WITHOUT CONTRAST TECHNIQUE: Multidetector CT imaging of the abdomen and pelvis was performed following the standard protocol without IV contrast. COMPARISON:  12/24/2014 FINDINGS: Lower chest: No acute abnormality. Coronary artery  calcifications. Hepatobiliary: No focal liver abnormality is seen. No gallstones, gallbladder wall thickening, or biliary dilatation. Pancreas: Unremarkable. No pancreatic ductal dilatation or surrounding inflammatory changes. Spleen: Normal in size without focal abnormality. Adrenals/Urinary Tract: Adrenal glands are unremarkable. Kidneys are normal, without renal calculi, focal lesion, or hydronephrosis. Multiple small calculi in the dependent urinary bladder as seen on prior examination. Stomach/Bowel: Stomach is within normal limits. Appendix appears normal. No evidence  of bowel wall thickening, distention, or inflammatory changes. Vascular/Lymphatic: Calcific atherosclerosis. No enlarged abdominal or pelvic lymph nodes. Reproductive: No mass or other abnormality. Status post hysterectomy. Other: No abdominal wall hernia or abnormality. No abdominopelvic ascites. Musculoskeletal: No acute or significant osseous findings. IMPRESSION: 1. No acute noncontrast CT findings of the abdomen or pelvis to explain hematuria or loose stools. 2. Multiple small calculi within the dependent urinary bladder, similar to prior study. Electronically Signed   By: Lauralyn PrimesAlex  Bibbey M.D.   On: 08/01/2018 15:42    Assessment & Plan:   There are no diagnoses linked to this encounter.   No orders of the defined types were placed in this encounter.    Follow-up: No follow-ups on file.  Sonda PrimesAlex Plotnikov, MD

## 2018-09-24 NOTE — Assessment & Plan Note (Signed)
Lorazepam prn, Doxepine at hs 

## 2018-09-24 NOTE — Assessment & Plan Note (Signed)
Off Ranitidine - doing well

## 2018-10-17 NOTE — Telephone Encounter (Signed)
Left message for patient to call back to schedule.  °

## 2018-10-20 ENCOUNTER — Encounter: Payer: Self-pay | Admitting: Internal Medicine

## 2018-10-20 ENCOUNTER — Other Ambulatory Visit: Payer: Self-pay

## 2018-10-20 ENCOUNTER — Ambulatory Visit (INDEPENDENT_AMBULATORY_CARE_PROVIDER_SITE_OTHER): Payer: Medicare Other | Admitting: Internal Medicine

## 2018-10-20 DIAGNOSIS — Z79890 Hormone replacement therapy (postmenopausal): Secondary | ICD-10-CM | POA: Diagnosis not present

## 2018-10-20 DIAGNOSIS — N952 Postmenopausal atrophic vaginitis: Secondary | ICD-10-CM | POA: Insufficient documentation

## 2018-10-20 DIAGNOSIS — R609 Edema, unspecified: Secondary | ICD-10-CM | POA: Diagnosis not present

## 2018-10-20 DIAGNOSIS — N760 Acute vaginitis: Secondary | ICD-10-CM | POA: Diagnosis not present

## 2018-10-20 MED ORDER — TRIAMCINOLONE ACETONIDE 0.1 % EX OINT: 1.0000 "application " | TOPICAL_OINTMENT | Freq: Three times a day (TID) | CUTANEOUS | 1 refills | Status: DC

## 2018-10-20 NOTE — Progress Notes (Signed)
Subjective:  Patient ID: [name], female    DOB: 03/12/1946  Age: 73 y.o. MRN: [id number]  CC: No chief complaint on file.   HPI [name] StallRuby C Tibbs presents for vaginal itching after using soap, washcloth rubbing over vaginal area  Outpatient Medications Prior to Visit  Medication Sig Dispense Refill  . aspirin 81 MG EC tablet Take 81 mg by mouth daily.      . Cholecalciferol 1000 UNITS tablet Take 1,000 Units by mouth daily.      . clorazepate (TRANXENE) 15 MG tablet Take 1 tablet (15 mg total) by mouth 3 (three) times daily as needed. for anxiety 270 tablet 1  . doxepin (SINEQUAN) 50 MG capsule Take 3 capsules (150 mg total) by mouth at bedtime. 270 capsule 3  . estradiol cypionate (DEPO-ESTRADIOL) 5 MG/ML injection Use 0.3 ml every 18-25 days IM 5 mL 3  . furosemide (LASIX) 40 MG tablet Take 1 tablet (40 mg total) by mouth 2 (two) times daily. 180 tablet 3  . levothyroxine (SYNTHROID, LEVOTHROID) 137 MCG tablet Take 1 tablet (137 mcg total) by mouth daily before breakfast. 30 tablet 11  . LORazepam (ATIVAN) 0.5 MG tablet Take 1 tablet (0.5 mg total) by mouth at bedtime. 90 tablet 1  . methylPREDNISolone (MEDROL DOSEPAK) 4 MG TBPK tablet As directed 21 tablet 0  . NIFEdipine (PROCARDIA-XL/NIFEDICAL-XL) 30 MG 24 hr tablet Take 1 tablet (30 mg total) by mouth daily. 90 tablet 3  . nitroGLYCERIN (NITROLINGUAL) 0.4 MG/SPRAY spray PLACE 1 SPRAY UNDER THE TONGUE EVERY 5 MINUTES AS NEEDED FOR CHEST PAIN. MAY REPEAT 3 TIMES 12 g 1  . potassium chloride SA (KLOR-CON M20) 20 MEQ tablet Take 1 tablet (20 mEq total) by mouth 2 (two) times daily. 180 tablet 3   Facility-Administered Medications Prior to Visit  Medication Dose Route Frequency Provider Last Rate Last Dose  . estradiol cypionate (DEPO-ESTRADIOL) 5 MG/ML injection 1.5 mg  1.5 mg Intramuscular Q30 days Plotnikov, Georgina QuintAleksei V, MD   1.5 mg at 09/24/18 1457    ROS: Review of Systems  Constitutional: Negative for activity change, appetite  change, chills, diaphoresis, fatigue, fever and unexpected weight change.  HENT: Negative for congestion, dental problem, ear pain, hearing loss, mouth sores, postnasal drip, sinus pressure, sneezing, sore throat and voice change.   Eyes: Negative for pain and visual disturbance.  Respiratory: Negative for cough, chest tightness, wheezing and stridor.   Cardiovascular: Negative for chest pain, palpitations and leg swelling.  Gastrointestinal: Negative for abdominal distention, abdominal pain, blood in stool, nausea, rectal pain and vomiting.  Genitourinary: Negative for decreased urine volume, difficulty urinating, dysuria, frequency, hematuria, menstrual problem, vaginal bleeding, vaginal discharge and vaginal pain.  Musculoskeletal: Positive for arthralgias. Negative for back pain, gait problem, joint swelling and neck pain.  Skin: Negative for color change, rash and wound.  Neurological: Negative for dizziness, tremors, syncope, speech difficulty, weakness and light-headedness.  Hematological: Negative for adenopathy.  Psychiatric/Behavioral: Negative for behavioral problems, confusion, decreased concentration, dysphoric mood, hallucinations, sleep disturbance and suicidal ideas. The patient is not nervous/anxious and is not hyperactive.     Objective:  BP 130/80 (BP Location: Left Arm, Patient Position: Sitting, Cuff Size: Normal)   Pulse 66   Temp 98 F (36.7 C) (Oral)   Ht 5\' 8"  (1.727 m)   Wt 163 lb (73.9 kg)   SpO2 97%   BMI 24.78 kg/m   BP Readings from Last 3 Encounters:  10/20/18 130/80  09/24/18 (!) 142/80  08/25/18 138/86    Wt Readings from Last 3 Encounters:  10/20/18 163 lb (73.9 kg)  09/24/18 164 lb (74.4 kg)  08/25/18 163 lb (73.9 kg)    Physical Exam Constitutional:      General: She is not in acute distress.    Appearance: She is well-developed.  HENT:     Head: Normocephalic.     Right Ear: External ear normal.     Left Ear: External ear normal.      Nose: Nose normal.  Eyes:     General:        Right eye: No discharge.        Left eye: No discharge.     Conjunctiva/sclera: Conjunctivae normal.     Pupils: Pupils are equal, round, and reactive to light.  Neck:     Musculoskeletal: Normal range of motion and neck supple.     Thyroid: No thyromegaly.     Vascular: No JVD.     Trachea: No tracheal deviation.  Cardiovascular:     Rate and Rhythm: Normal rate and regular rhythm.     Heart sounds: Normal heart sounds.  Pulmonary:     Effort: No respiratory distress.     Breath sounds: No stridor. No wheezing.  Abdominal:     General: Bowel sounds are normal. There is no distension.     Palpations: Abdomen is soft. There is no mass.     Tenderness: There is no abdominal tenderness. There is no guarding or rebound.  Musculoskeletal:        General: No tenderness.  Lymphadenopathy:     Cervical: No cervical adenopathy.  Skin:    Findings: No erythema or rash.  Neurological:     Cranial Nerves: No cranial nerve deficit.     Motor: No abnormal muscle tone.     Coordination: Coordination normal.     Deep Tendon Reflexes: Reflexes normal.  Psychiatric:        Behavior: Behavior normal.        Thought Content: Thought content normal.        Judgment: Judgment normal.     Lab Results  Component Value Date   WBC 4.8 07/29/2018   HGB 12.4 07/29/2018   HCT 36.9 07/29/2018   PLT 239.0 07/29/2018   GLUCOSE 91 07/29/2018   CHOL 236 (H) 11/02/2016   TRIG 100.0 11/02/2016   HDL 60.60 11/02/2016   LDLDIRECT 188.2 01/10/2012   LDLCALC 155 (H) 11/02/2016   ALT 14 07/29/2018   AST 19 07/29/2018   NA 139 07/29/2018   K 3.8 07/29/2018   CL 103 07/29/2018   CREATININE 1.11 07/29/2018   BUN 14 07/29/2018   CO2 26 07/29/2018   TSH 0.15 (L) 07/17/2018   INR 1.0 08/01/2018    Ct Renal Stone Study  Result Date: 08/01/2018 CLINICAL DATA:  Hematuria, loose stools EXAM: CT ABDOMEN AND PELVIS WITHOUT CONTRAST TECHNIQUE: Multidetector  CT imaging of the abdomen and pelvis was performed following the standard protocol without IV contrast. COMPARISON:  12/24/2014 FINDINGS: Lower chest: No acute abnormality. Coronary artery calcifications. Hepatobiliary: No focal liver abnormality is seen. No gallstones, gallbladder wall thickening, or biliary dilatation. Pancreas: Unremarkable. No pancreatic ductal dilatation or surrounding inflammatory changes. Spleen: Normal in size without focal abnormality. Adrenals/Urinary Tract: Adrenal glands are unremarkable. Kidneys are normal, without renal calculi, focal lesion, or hydronephrosis. Multiple small calculi in the dependent urinary bladder as seen on prior examination. Stomach/Bowel: Stomach is within normal limits. Appendix appears normal.  No evidence of bowel wall thickening, distention, or inflammatory changes. Vascular/Lymphatic: Calcific atherosclerosis. No enlarged abdominal or pelvic lymph nodes. Reproductive: No mass or other abnormality. Status post hysterectomy. Other: No abdominal wall hernia or abnormality. No abdominopelvic ascites. Musculoskeletal: No acute or significant osseous findings. IMPRESSION: 1. No acute noncontrast CT findings of the abdomen or pelvis to explain hematuria or loose stools. 2. Multiple small calculi within the dependent urinary bladder, similar to prior study. Electronically Signed   By: Lauralyn Primes M.D.   On: 08/01/2018 15:42    Assessment & Plan:   There are no diagnoses linked to this encounter.   No orders of the defined types were placed in this encounter.    Follow-up: No follow-ups on file.  Sonda Primes, MD

## 2018-10-20 NOTE — Assessment & Plan Note (Signed)
Will give injection

## 2018-10-20 NOTE — Assessment & Plan Note (Signed)
No relapse Compr socks prn

## 2018-10-20 NOTE — Assessment & Plan Note (Signed)
Vulvitis, allergic/mechanical Triamc oint tid

## 2018-10-23 ENCOUNTER — Ambulatory Visit: Payer: Medicare Other | Admitting: Internal Medicine

## 2018-11-19 NOTE — Telephone Encounter (Signed)
Addressed on other mychart message

## 2018-11-24 ENCOUNTER — Encounter: Payer: Self-pay | Admitting: Internal Medicine

## 2018-11-24 ENCOUNTER — Other Ambulatory Visit: Payer: Self-pay

## 2018-11-24 ENCOUNTER — Ambulatory Visit (INDEPENDENT_AMBULATORY_CARE_PROVIDER_SITE_OTHER): Payer: Medicare Other | Admitting: Internal Medicine

## 2018-11-24 ENCOUNTER — Other Ambulatory Visit (INDEPENDENT_AMBULATORY_CARE_PROVIDER_SITE_OTHER): Payer: Medicare Other

## 2018-11-24 DIAGNOSIS — Z79890 Hormone replacement therapy (postmenopausal): Secondary | ICD-10-CM

## 2018-11-24 DIAGNOSIS — N952 Postmenopausal atrophic vaginitis: Secondary | ICD-10-CM | POA: Diagnosis not present

## 2018-11-24 DIAGNOSIS — N951 Menopausal and female climacteric states: Secondary | ICD-10-CM | POA: Diagnosis not present

## 2018-11-24 DIAGNOSIS — I10 Essential (primary) hypertension: Secondary | ICD-10-CM

## 2018-11-24 DIAGNOSIS — I2510 Atherosclerotic heart disease of native coronary artery without angina pectoris: Secondary | ICD-10-CM | POA: Diagnosis not present

## 2018-11-24 DIAGNOSIS — K2100 Gastro-esophageal reflux disease with esophagitis, without bleeding: Secondary | ICD-10-CM

## 2018-11-24 LAB — URINALYSIS, ROUTINE W REFLEX MICROSCOPIC
Bilirubin Urine: NEGATIVE
Hgb urine dipstick: NEGATIVE
Ketones, ur: NEGATIVE
Nitrite: NEGATIVE
RBC / HPF: NONE SEEN (ref 0–?)
Specific Gravity, Urine: 1.01 (ref 1.000–1.030)
Total Protein, Urine: NEGATIVE
Urine Glucose: NEGATIVE
Urobilinogen, UA: 0.2 (ref 0.0–1.0)
pH: 5 (ref 5.0–8.0)

## 2018-11-24 MED ORDER — ESTRADIOL 0.1 MG/GM VA CREA: 1.0000 | TOPICAL_CREAM | VAGINAL | 12 refills | Status: DC

## 2018-11-24 NOTE — Assessment & Plan Note (Signed)
Toprol, Nifedipine 

## 2018-11-24 NOTE — Assessment & Plan Note (Signed)
Triamc oint tid Start Estrogen vaginal cream  GYN ref

## 2018-11-24 NOTE — Progress Notes (Signed)
Subjective:  Patient ID: [name] StallRuby C Deen, female    DOB: 09/27/1945  Age: 73 y.o. MRN: [id number]  CC: No chief complaint on file.   HPI [name] presents for vaginal burning - recurrent No itching  F/u hot flashes - on injections  Outpatient Medications Prior to Visit  Medication Sig Dispense Refill  . aspirin 81 MG EC tablet Take 81 mg by mouth daily.      . Cholecalciferol 1000 UNITS tablet Take 1,000 Units by mouth daily.      . clorazepate (TRANXENE) 15 MG tablet Take 1 tablet (15 mg total) by mouth 3 (three) times daily as needed. for anxiety 270 tablet 1  . doxepin (SINEQUAN) 50 MG capsule Take 3 capsules (150 mg total) by mouth at bedtime. 270 capsule 3  . estradiol cypionate (DEPO-ESTRADIOL) 5 MG/ML injection Use 0.3 ml every 18-25 days IM 5 mL 3  . furosemide (LASIX) 40 MG tablet Take 1 tablet (40 mg total) by mouth 2 (two) times daily. 180 tablet 3  . levothyroxine (SYNTHROID, LEVOTHROID) 137 MCG tablet Take 1 tablet (137 mcg total) by mouth daily before breakfast. 30 tablet 11  . LORazepam (ATIVAN) 0.5 MG tablet Take 1 tablet (0.5 mg total) by mouth at bedtime. 90 tablet 1  . methylPREDNISolone (MEDROL DOSEPAK) 4 MG TBPK tablet As directed 21 tablet 0  . NIFEdipine (PROCARDIA-XL/NIFEDICAL-XL) 30 MG 24 hr tablet Take 1 tablet (30 mg total) by mouth daily. 90 tablet 3  . nitroGLYCERIN (NITROLINGUAL) 0.4 MG/SPRAY spray PLACE 1 SPRAY UNDER THE TONGUE EVERY 5 MINUTES AS NEEDED FOR CHEST PAIN. MAY REPEAT 3 TIMES 12 g 1  . potassium chloride SA (KLOR-CON M20) 20 MEQ tablet Take 1 tablet (20 mEq total) by mouth 2 (two) times daily. 180 tablet 3  . triamcinolone ointment (KENALOG) 0.1 % Apply 1 application topically 3 (three) times daily. 60 g 1   Facility-Administered Medications Prior to Visit  Medication Dose Route Frequency Provider Last Rate Last Dose  . estradiol cypionate (DEPO-ESTRADIOL) 5 MG/ML injection 1.5 mg  1.5 mg Intramuscular Q30 days Plotnikov, Georgina QuintAleksei V, MD   1.5  mg at 10/20/18 1349    ROS: Review of Systems  Constitutional: Negative for activity change, appetite change, chills, fatigue and unexpected weight change.  HENT: Negative for congestion, mouth sores and sinus pressure.   Eyes: Negative for visual disturbance.  Respiratory: Negative for cough and chest tightness.   Gastrointestinal: Negative for abdominal pain and nausea.  Genitourinary: Positive for vaginal pain. Negative for difficulty urinating and frequency.  Musculoskeletal: Negative for back pain and gait problem.  Skin: Negative for pallor and rash.  Neurological: Negative for dizziness, tremors, weakness, numbness and headaches.  Psychiatric/Behavioral: Negative for confusion and sleep disturbance.    Objective:  BP 136/84 (BP Location: Left Arm, Patient Position: Sitting, Cuff Size: Normal)   Pulse 63   Temp (!) 97.3 F (36.3 C) (Oral)   Ht 5\' 8"  (1.727 m)   Wt 163 lb (73.9 kg)   SpO2 96%   BMI 24.78 kg/m   BP Readings from Last 3 Encounters:  11/24/18 136/84  10/20/18 130/80  09/24/18 (!) 142/80    Wt Readings from Last 3 Encounters:  11/24/18 163 lb (73.9 kg)  10/20/18 163 lb (73.9 kg)  09/24/18 164 lb (74.4 kg)    Physical Exam Constitutional:      General: She is not in acute distress.    Appearance: She is well-developed.  HENT:  Head: Normocephalic.     Right Ear: External ear normal.     Left Ear: External ear normal.     Nose: Nose normal.  Eyes:     General:        Right eye: No discharge.        Left eye: No discharge.     Conjunctiva/sclera: Conjunctivae normal.     Pupils: Pupils are equal, round, and reactive to light.  Neck:     Musculoskeletal: Normal range of motion and neck supple.     Thyroid: No thyromegaly.     Vascular: No JVD.     Trachea: No tracheal deviation.  Cardiovascular:     Rate and Rhythm: Normal rate and regular rhythm.     Heart sounds: Normal heart sounds.  Pulmonary:     Effort: No respiratory distress.      Breath sounds: No stridor. No wheezing.  Abdominal:     General: Bowel sounds are normal. There is no distension.     Palpations: Abdomen is soft. There is no mass.     Tenderness: There is no abdominal tenderness. There is no guarding or rebound.  Musculoskeletal:        General: No tenderness.  Lymphadenopathy:     Cervical: No cervical adenopathy.  Skin:    Findings: No erythema or rash.  Neurological:     Cranial Nerves: No cranial nerve deficit.     Motor: No abnormal muscle tone.     Coordination: Coordination normal.     Deep Tendon Reflexes: Reflexes normal.  Psychiatric:        Behavior: Behavior normal.        Thought Content: Thought content normal.        Judgment: Judgment normal.     Lab Results  Component Value Date   WBC 4.8 07/29/2018   HGB 12.4 07/29/2018   HCT 36.9 07/29/2018   PLT 239.0 07/29/2018   GLUCOSE 91 07/29/2018   CHOL 236 (H) 11/02/2016   TRIG 100.0 11/02/2016   HDL 60.60 11/02/2016   LDLDIRECT 188.2 01/10/2012   LDLCALC 155 (H) 11/02/2016   ALT 14 07/29/2018   AST 19 07/29/2018   NA 139 07/29/2018   K 3.8 07/29/2018   CL 103 07/29/2018   CREATININE 1.11 07/29/2018   BUN 14 07/29/2018   CO2 26 07/29/2018   TSH 0.15 (L) 07/17/2018   INR 1.0 08/01/2018    Ct Renal Stone Study  Result Date: 08/01/2018 CLINICAL DATA:  Hematuria, loose stools EXAM: CT ABDOMEN AND PELVIS WITHOUT CONTRAST TECHNIQUE: Multidetector CT imaging of the abdomen and pelvis was performed following the standard protocol without IV contrast. COMPARISON:  12/24/2014 FINDINGS: Lower chest: No acute abnormality. Coronary artery calcifications. Hepatobiliary: No focal liver abnormality is seen. No gallstones, gallbladder wall thickening, or biliary dilatation. Pancreas: Unremarkable. No pancreatic ductal dilatation or surrounding inflammatory changes. Spleen: Normal in size without focal abnormality. Adrenals/Urinary Tract: Adrenal glands are unremarkable. Kidneys are  normal, without renal calculi, focal lesion, or hydronephrosis. Multiple small calculi in the dependent urinary bladder as seen on prior examination. Stomach/Bowel: Stomach is within normal limits. Appendix appears normal. No evidence of bowel wall thickening, distention, or inflammatory changes. Vascular/Lymphatic: Calcific atherosclerosis. No enlarged abdominal or pelvic lymph nodes. Reproductive: No mass or other abnormality. Status post hysterectomy. Other: No abdominal wall hernia or abnormality. No abdominopelvic ascites. Musculoskeletal: No acute or significant osseous findings. IMPRESSION: 1. No acute noncontrast CT findings of the abdomen or pelvis to explain  hematuria or loose stools. 2. Multiple small calculi within the dependent urinary bladder, similar to prior study. Electronically Signed   By: Lauralyn PrimesAlex  Bibbey M.D.   On: 08/01/2018 15:42    Assessment & Plan:   There are no diagnoses linked to this encounter.   No orders of the defined types were placed in this encounter.    Follow-up: No follow-ups on file.  Sonda PrimesAlex Plotnikov, MD

## 2018-11-24 NOTE — Assessment & Plan Note (Addendum)
Depo-Estradiol 0.3 ml  Potential benefits of a long term HRT  use as well as potential risks  and complications were explained to the patient and were aknowledged. Premarin cream 7/20 GYN ref

## 2018-11-24 NOTE — Assessment & Plan Note (Signed)
Off Rx 

## 2018-11-24 NOTE — Assessment & Plan Note (Signed)
Depo-Estradiol 0.3 ml  Potential benefits of a long term HRT  use as well as potential risks  and complications were explained to the patient and were aknowledged. 

## 2018-11-28 ENCOUNTER — Other Ambulatory Visit: Payer: Self-pay | Admitting: Internal Medicine

## 2018-11-28 ENCOUNTER — Other Ambulatory Visit (INDEPENDENT_AMBULATORY_CARE_PROVIDER_SITE_OTHER): Payer: Medicare Other

## 2018-11-28 ENCOUNTER — Other Ambulatory Visit: Payer: Self-pay

## 2018-11-28 DIAGNOSIS — R300 Dysuria: Secondary | ICD-10-CM

## 2018-11-28 LAB — URINALYSIS, ROUTINE W REFLEX MICROSCOPIC
Bilirubin Urine: NEGATIVE
Hgb urine dipstick: NEGATIVE
Ketones, ur: NEGATIVE
Nitrite: NEGATIVE
RBC / HPF: NONE SEEN (ref 0–?)
Specific Gravity, Urine: <=1.005 — AB (ref 1.000–1.030)
Total Protein, Urine: NEGATIVE
Urine Glucose: NEGATIVE
Urobilinogen, UA: 0.2 (ref 0.0–1.0)
pH: 5.5 (ref 5.0–8.0)

## 2018-11-28 MED ORDER — NITROFURANTOIN MONOHYD MACRO 100 MG PO CAPS: 100.0000 mg | ORAL_CAPSULE | Freq: Two times a day (BID) | ORAL | 0 refills | Status: AC

## 2018-11-29 LAB — URINE CULTURE
MICRO NUMBER:: 654864
Result:: NO GROWTH
SPECIMEN QUALITY:: ADEQUATE

## 2018-12-03 ENCOUNTER — Other Ambulatory Visit: Payer: Self-pay

## 2018-12-08 ENCOUNTER — Encounter: Payer: Self-pay | Admitting: Obstetrics and Gynecology

## 2018-12-08 ENCOUNTER — Other Ambulatory Visit: Payer: Self-pay

## 2018-12-08 ENCOUNTER — Ambulatory Visit (INDEPENDENT_AMBULATORY_CARE_PROVIDER_SITE_OTHER): Payer: Medicare Other | Admitting: Obstetrics and Gynecology

## 2018-12-08 VITALS — BP 138/80 | HR 88 | Temp 97.7°F | Wt 162.0 lb

## 2018-12-08 DIAGNOSIS — L900 Lichen sclerosus et atrophicus: Secondary | ICD-10-CM | POA: Diagnosis not present

## 2018-12-08 DIAGNOSIS — N952 Postmenopausal atrophic vaginitis: Secondary | ICD-10-CM | POA: Diagnosis not present

## 2018-12-08 DIAGNOSIS — N8111 Cystocele, midline: Secondary | ICD-10-CM | POA: Diagnosis not present

## 2018-12-08 MED ORDER — BETAMETHASONE VALERATE 0.1 % EX OINT: TOPICAL_OINTMENT | CUTANEOUS | 0 refills | Status: DC

## 2018-12-08 NOTE — Patient Instructions (Signed)
Lichen Sclerosus Lichen sclerosus is a skin problem. It can happen on any part of the body, but it commonly involves the anal or genital areas. It can cause itching and discomfort in these areas. Treatment can help to control symptoms. When the genital area is affected, getting treatment is important because the condition can cause scarring that may lead to other problems. What are the causes? The cause of this condition is not known. It may be related to an overactive immune system or a lack of certain hormones. Lichen sclerosus is not an infection or a fungus, and it is nAbout Cystocele  Overview  The pelvic organs, including the bladder, are normally supported by pelvic floor muscles and ligaments.  When these muscles and ligaments are stretched, weakened or torn, the wall between the bladder and the vagina sags or herniates causing a prolapse, sometimes called a cystocele.  This condition may cause discomfort and problems with emptying the bladder.  It can be present in various stages.  Some people are not aware of the changes.  Others may notice changes at the vaginal opening or a feeling of the bladder dropping outside the body.  Causes of a Cystocele  A cystocele is usually caused by muscle straining or stretching during childbirth.  In addition, cystocele is more common after menopause, because the hormone estrogen helps keep the elastic tissues around the pelvic organs strong.  A cystocele is more likely to occur when levels of estrogen decrease.  Other causes include: heavy lifting, chronic coughing, previous pelvic surgery and obesity.  Symptoms  A bladder that has dropped from its normal position may cause: unwanted urine leakage (stress incontinence), frequent urination or urge to urinate, incomplete emptying of the bladder (not feeling bladder relief after emptying), pain or discomfort in the vagina, pelvis, groin, lower back or lower abdomen and frequent urinary tract infections.   Mild cases may not cause any symptoms.  Treatment Options Pelvic floor (Kegel) exercises:  Strength training the muscles in your genital area Behavioral changes: Treating and preventing constipation, taking time to empty your bladder properly, learning to lift properly and/or avoid heavy lifting when possible, stopping smoking, avoiding weight gain and treating a chronic cough or bronchitis. A pessary: A vaginal support device is sometimes used to help pelvic support caused by muscle and ligament changes. Surgery: Surgical repair may be necessary if symptoms cannot be managed with exercise, behavioral changes and a pessary.  Surgery is usually considered for severe cases.   2007, [employer] passed from one person to another (not contagious). What increases the risk? This condition is more likely to develop in women, usually after menopause. What are the signs or symptoms? Symptoms of this condition include:  Thin, wrinkled, white areas on the skin.  Thickened white areas on the skin.  Red and swollen patches (lesions) on the skin.  Tears or cracks in the skin.  Bruising.  Blood blisters.  Severe itching.  Pain, itching, or burning when urinating. Constipation is also common in people with lichen sclerosus. How is this diagnosed? This condition may be diagnosed with a physical exam. In some cases, a tissue sample (biopsy sample) may be removed to be looked at under a microscope. How is this treated? This condition is usually treated with medicated creams or ointments (topical steroids) that are applied over the affected areas. In some cases, treatment may also include medicines that are taken by mouth. Surgery may be needed in more severe cases that are causing problems such  as scarring. Follow these instructions at home:  Take or use over-the-counter and prescription medicines only as told by your health care provider.  Use creams or ointments as told by your  health care provider.  Do not scratch the affected areas of skin.  If you are a woman, be sure to keep the vaginal area as clean and dry as possible.  Clean the affected area of skin gently with water. Avoid using rough towels or toilet paper.  Keep all follow-up visits as told by your health care provider. This is important. Contact a health care provider if:  You have increasing redness, swelling, or pain in the affected area.  You have fluid, blood, or pus coming from the affected area.  You have new lesions on your skin.  You have a fever.  You have pain during sex. Summary  Lichen sclerosus is a skin problem. When the genital area is affected, getting treatment is important because the condition can cause scarring that may lead to other problems.  This condition is usually treated with medicated creams or ointments (topical steroids) that are applied over the affected areas.  Take or use over-the-counter and prescription medicines only as told by your health care provider.  Contact a health care provider if you have new lesions on your skin, have pain during sex, or have increasing redness, swelling, or pain in the affected area.  Keep all follow-up visits as told by your health care provider. This is important. This information is not intended to replace advice given to you by your health care provider. Make sure you discuss any questions you have with your health care provider. Document Released: 09/27/2010 Document Revised: 09/19/2017 Document Reviewed: 09/19/2017 Elsevier Patient Education  2020 [employer]

## 2018-12-08 NOTE — Progress Notes (Signed)
73 y.o. No obstetric history on file. Divorced White or Caucasian Not Hispanic or Latino female here for vaginitis symptoms, she is referred by Dr Alain Marion.  She has a known cystocele. She is aware of the bulge sometimes, particularly if she sits on the floor. Just occasionally notices the bulge if she isn't washing.  In May she tried a new soap and the next day she started burning in the clitoral region. She then started having burning with urination. She has had a negative urine culture.  She has been using vaseline on the vulva or zinc oxide. Those helped some.  She is not sexually active.  Her primary started her on estrogen vaginal cream a week ago. She was told to use it 3 times a week. The first time she used it she felt better after about 5 minutes later. She wasn't sure where to insert the estrogen with her cystocele (in front of it or behind it). She is starting to feel a little better.   She thinks she has started feeling some dampness on the panty liner. Not aware of any leakage. She isn't sure if she is completely emptying her bladder, occasionally double voids. She thinks her stream is normal.      No LMP recorded. Patient is postmenopausal.          Sexually active: No.  The current method of family planning is post menopausal status.    Exercising: No.  The patient does not participate in regular exercise at present. Smoker:  no  Health Maintenance: Pap:  Unsure History of abnormal Pap:  no MMG:  05/12/2018 Birads 1 negative BMD:  No  Colonoscopy: Never, declines TDaP:  Thinks UTD   reports that she quit smoking about 27 years ago. She has never used smokeless tobacco. She reports that she does not drink alcohol or use drugs. 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   Past Medical History:  Diagnosis Date  . Anxiety state, unspecified   . Bronchitis, acute   . Carotid bruit   . Coronary atherosclerosis of unspecified type of vessel, native or graft    multiple  angioplasy procedures in 90's/early 2000's  . De Quervain's tenosynovitis   . Depressive disorder, not elsewhere classified   . Female bladder prolapse   . Lichen 2595   Vaginal  . Other and unspecified hyperlipidemia   . Unspecified essential hypertension   . Unspecified hypothyroidism     Past Surgical History:  Procedure Laterality Date  . MOUTH SURGERY    . NOSE SURGERY  1999  . TOTAL VAGINAL HYSTERECTOMY  1994    Current Outpatient Medications  Medication Sig Dispense Refill  . aspirin 81 MG EC tablet Take 81 mg by mouth daily.      . Cholecalciferol 1000 UNITS tablet Take 1,000 Units by mouth daily.      . clorazepate (TRANXENE) 15 MG tablet Take 1 tablet (15 mg total) by mouth 3 (three) times daily as needed. for anxiety 270 tablet 1  . doxepin (SINEQUAN) 50 MG capsule Take 3 capsules (150 mg total) by mouth at bedtime. 270 capsule 3  . estradiol (ESTRACE VAGINAL) 0.1 MG/GM vaginal cream Place 1 Applicatorful vaginally 3 (three) times a week. 42.5 g 12  . estradiol cypionate (DEPO-ESTRADIOL) 5 MG/ML injection Use 0.3 ml every 18-25 days IM 5 mL 3  . furosemide (LASIX) 40 MG tablet Take 1 tablet (40 mg total) by mouth 2 (two) times daily. 180 tablet 3  . levothyroxine (SYNTHROID,  LEVOTHROID) 137 MCG tablet Take 1 tablet (137 mcg total) by mouth daily before breakfast. 30 tablet 11  . LORazepam (ATIVAN) 0.5 MG tablet Take 1 tablet (0.5 mg total) by mouth at bedtime. 90 tablet 1  . methylPREDNISolone (MEDROL DOSEPAK) 4 MG TBPK tablet As directed 21 tablet 0  . NIFEdipine (PROCARDIA-XL/NIFEDICAL-XL) 30 MG 24 hr tablet Take 1 tablet (30 mg total) by mouth daily. 90 tablet 3  . nitroGLYCERIN (NITROLINGUAL) 0.4 MG/SPRAY spray PLACE 1 SPRAY UNDER THE TONGUE EVERY 5 MINUTES AS NEEDED FOR CHEST PAIN. MAY REPEAT 3 TIMES 12 g 1  . potassium chloride SA (KLOR-CON M20) 20 MEQ tablet Take 1 tablet (20 mEq total) by mouth 2 (two) times daily. 180 tablet 3   Current Facility-Administered  Medications  Medication Dose Route Frequency Provider Last Rate Last Dose  . estradiol cypionate (DEPO-ESTRADIOL) 5 MG/ML injection 1.5 mg  1.5 mg Intramuscular Q30 days Plotnikov, Georgina QuintAleksei V, MD   1.5 mg at 11/24/18 [id number]    Family History  Problem Relation Age of Onset  . Hypertension Mother   . Hypertension Father   . Hypertension Other   . Coronary artery disease Other     Review of Systems  Constitutional: Negative.   HENT: Negative.   Eyes: Negative.   Respiratory: Negative.   Cardiovascular: Negative.   Gastrointestinal: Negative.   Endocrine: Negative.   Genitourinary:       Vaginal irritation  Musculoskeletal: Negative.   Skin: Negative.   Allergic/Immunologic: Negative.   Neurological: Negative.   Hematological: Negative.   Psychiatric/Behavioral: Negative.     Exam:   BP 138/80 (BP Location: Left Arm, Patient Position: Sitting, Cuff Size: Normal)   Pulse 88   Temp 97.7 F (36.5 C) (Skin)   Wt 162 lb (73.5 kg)   BMI 24.63 kg/m   Weight change: @WEIGHTCHANGE @ Height:      Ht Readings from Last 3 Encounters:  11/24/18 5\' 8"  (1.727 m)  10/20/18 5\' 8"  (1.727 m)  09/24/18 5\' 8"  (1.727 m)    General appearance: alert, cooperative and appears stated age   Pelvic: External genitalia:  no lesions, vulva fused superiorly over the clitoris, mild whitening, complete loss of architecture, no labia minora seen              Urethra:  normal appearing urethra with no masses, tenderness or lesions              Bartholins and Skenes: normal                 Vagina: mildly atrophic appearing vagina with a large grade 2 cystocele, comes ~ 1 cm outside of the introitus while standing and with valsalva. No significant vault prolapse or rectocele.               Cervix: absent               Bimanual Exam:  Uterus:  uterus absent              Adnexa: no mass, fullness, tenderness               Rectovaginal: Confirms               Anus:  normal sphincter tone, no  lesions  Chaperone was present for exam.  St cath ua: PVR 15 cc  A:  Lichen sclerosis  Midline cystocele  Vaginal atrophy  P:   Check PVR  Steroid ointment to vulva, BID x 2  weeks, then f/u  Continue vaginal cream  Discussed pessary and the option of trying a pessary, information given. Given her normal PVR, I would recommend a pessary if the bulge is bothering her  Reviewed prolapse and lichen sclerosis, information given  She thinks she wants to do a pessary fitting.  ~ 1 hour was spent with the patient, over 80% in counseling  CC: Dr Posey ReaPlotnikov

## 2018-12-09 ENCOUNTER — Telehealth: Payer: Self-pay | Admitting: Obstetrics and Gynecology

## 2018-12-09 NOTE — Telephone Encounter (Signed)
Spoke with patient. Requesting to schedule 2 wk f/u for LS. Patient declines morning appt.  OV scheduled for 8/5 at 4:30pm. Patient is aware she may need to return for pessary fitting if she decides to proceed. Patient verbalizes understanding.   Routing to provider for final review. Patient is agreeable to disposition. Will close encounter.

## 2018-12-09 NOTE — Telephone Encounter (Signed)
Results and any recommendations were sent via MyChart.  

## 2018-12-09 NOTE — Telephone Encounter (Signed)
Patient sent the following correspondence through [place]. Routing to triage to assist patient with request.  12-08-2018  Dr Talbert Nan,  Pharmacy did not have your prescription ointment today but can have by [facility] 12-09-2018.    An observation - since you checked to find if I was emptying bladder today, it seems almost like I can feel air in the urethra area - even with my panties on. Urine flow is good as discussed.    Just wondered since you had to "reach that area" due to my prolapsed bladder, if urethra has been "out of place" so to speak - & this process was good for me.    Your comments are appreciated.    I can't thank you enough for the time you spent explaining my condition.  Thank you.  [name]

## 2018-12-09 NOTE — Telephone Encounter (Signed)
Patient needs a 2 week follow up with Dr. Talbert Nan. States any time after 1 pm works best for her.

## 2018-12-09 NOTE — Telephone Encounter (Signed)
Dr. Jertson  -please review and advise.  

## 2018-12-10 ENCOUNTER — Telehealth: Payer: Self-pay | Admitting: Obstetrics and Gynecology

## 2018-12-10 NOTE — Telephone Encounter (Signed)
Patient sent the following correspondence through [place]. Routing to triage to assist patient with request.  12-10-2018  Dr Nelson Chimes,  Thank you for your response & explanation!  I used 1 gram Estradiol Cream Tues PM - it made be feel irritated upon waking this AM.  I cleaned with cold water [patting dry] after urinating with no problem.  Question 1: Used prescription ointment for 1st time only on vulva fused area, no sensitivity noticed yet. Is this proper place to use cream or does cream need to be used further down too?  Question 2: Do I need to buy complete cotton panties or just cotton crotch panties?  [Have been wearing Nylon with Spandex panties for years - want to follow instructions for this condition]  Need clarification.  Thank you.  [name]

## 2018-12-11 ENCOUNTER — Encounter: Payer: Self-pay | Admitting: Obstetrics and Gynecology

## 2018-12-11 NOTE — Telephone Encounter (Signed)
Spoke with patient. Reviewed instructions for betamethasone, questions answered. Discussed benefits of cotton panties, advised ultimately her decision, questions answered. Patient thankful for call.   Routing to provider for final review. Patient is agreeable to disposition. Will close encounter.

## 2018-12-12 ENCOUNTER — Ambulatory Visit (INDEPENDENT_AMBULATORY_CARE_PROVIDER_SITE_OTHER): Payer: Medicare Other | Admitting: Certified Nurse Midwife

## 2018-12-12 ENCOUNTER — Other Ambulatory Visit: Payer: Self-pay

## 2018-12-12 ENCOUNTER — Encounter: Payer: Self-pay | Admitting: Certified Nurse Midwife

## 2018-12-12 ENCOUNTER — Telehealth: Payer: Self-pay | Admitting: Obstetrics and Gynecology

## 2018-12-12 VITALS — BP 130/72 | HR 70 | Temp 97.2°F | Resp 16 | Wt 162.0 lb

## 2018-12-12 DIAGNOSIS — N905 Atrophy of vulva: Secondary | ICD-10-CM

## 2018-12-12 NOTE — Telephone Encounter (Signed)
12-11-2018  Dr Talbert Nan:  The inner fold of vulva that is closest to the prolapsed bladder [ labia minora I think] is burning so badly tonight.    Looked with mirror - appears to be several bumps or raised places inside area on both sides described above.  Have been like this off & on before - it just happened to be clear of these bumps on Monday.    Am using prescription cream on Lichen Sclerosus area only.    Please tell me what to do either by phone or return message - or if you need to see it.  I need advice - cannot go the weekend like this.    Thank you.  Barbette Hair

## 2018-12-12 NOTE — Telephone Encounter (Signed)
Spoke with patient. Patient reports painful blisters and burning inside labia, "looks like little erosions". States she has had these occur in the past. Started valisone on 7/23 for LS, symptoms started after using cream. Discomfort kept her up last night, could feeli burning with every movement. Has tried zince oxide and old Rx of betamethasone with no relief. Denies urinary symptoms. [id number] prescreen negative.  Advised patient not to use any more creams or ointment, recommended OV for further evaluation. Patient agreeable to seeing covering provider. OV scheduled for today at 4pm with Melvia Heaps, CNM.   Routing to provider for final review. Patient is agreeable to disposition. Will close encounter.  Cc: Dr. Talbert Nan

## 2018-12-12 NOTE — Progress Notes (Signed)
  Subjective:     Patient ID: [name], female   DOB: 1946-03-12, 73 y.o.   MRN: [id number]  73 yo white female here with complaint of vaginal itching with ? Blisters. Has vaginal itching and history of Lichen Sclerosis. Feels better today, than the past 2 days. Here to be checked to make sure no other issues. No new personal products. Has vaginal dryness and was using estradiol cream, but having burning. She thought she had blisters in her vagina and came in to make sure no other problems. Has been treating fused area at top of vulva, treating with cream with good results. No urinary complaints or vaginal discharge or odor. "Just want to make sure all normal before weekend".     Review of Systems  Constitutional: Negative.   HENT: Negative.   Eyes: Negative.   Respiratory: Negative.   Gastrointestinal: Negative.   Endocrine: Negative.   Genitourinary: Negative for vaginal pain.       Vaginal itching, chronic seems better today. Has medication to treat.  Musculoskeletal: Negative.   Skin: Negative.   Allergic/Immunologic: Negative.   Neurological: Negative.   Hematological: Negative.   Psychiatric/Behavioral: Negative.        Objective:   Physical Exam Exam conducted with a chaperone present (Atrophy noted in addition to fused area noted on diagram).  Constitutional:      Appearance: Normal appearance. She is well-developed.  Cardiovascular:     Rate and Rhythm: Normal rate.  Pulmonary:     Effort: Pulmonary effort is normal.  Abdominal:     Palpations: Abdomen is soft.  Genitourinary:    General: Normal vulva.     Exam position: Lithotomy position.     Pubic Area: No rash.      Labia:        Right: No rash, tenderness, lesion or injury.        Left: No rash, tenderness, lesion or injury.     Lymphadenopathy:     Lower Body: No right inguinal adenopathy. No left inguinal adenopathy.  Neurological:     Mental Status: She is alert.        Assessment:      Atrophic vulva/vagina, with normal findings, no blisters present,  Grade 3 cystocele with plan for pessary in the next month. Post menopausal    Plan:     Shown area in mirror and pointed to normal appearance and anatomy. Patient reassured, that medication can cause occasional warmth, but no reaction noted. Questions addressed at length. Reminded to follow instructions from Dr. Talbert Nan and keep appt.  Rv prn

## 2018-12-16 ENCOUNTER — Telehealth: Payer: Self-pay | Admitting: Obstetrics and Gynecology

## 2018-12-16 ENCOUNTER — Encounter: Payer: Self-pay | Admitting: Obstetrics and Gynecology

## 2018-12-16 NOTE — Telephone Encounter (Signed)
Patient has a two week follow up appointmentt 12/24/18 and would like to reschedule to another afternoon. I offered her a 4:00pm on  01/01/19 and she would like to make sure it is okay to continue using the medication that long? She stated she will not be available by phone today and to please call her tomorrow.

## 2018-12-17 ENCOUNTER — Telehealth: Payer: Self-pay | Admitting: Obstetrics and Gynecology

## 2018-12-17 NOTE — Telephone Encounter (Signed)
Patient sent the following correspondence through [place]. Routing to triage to assist patient with request.  Dr Talbert Nan,  Asking your advice as I update you on my condition:  1. Used Prescribed Cream twice daily 7-22 & 7-23 [had to wait for pharmacy ]  2. Experienced painful burning continuously 7-23 PM.  3. Sent message to office 7-23 PM  4. Office called me 7-24 - told me not to use Cream & come in & see midwife  5. Midwife checked me, said skip Cream on 7-25 - resume Cream 7-26 & use every other day.  6. So 2 days with no Cream, felt better & no burning.  7. Used Cream twice 7-26, had no burning - felt encouraged, used Cream twice 7-27 rather than skipping a day  8. Used Cream once 7-28 AM only since I am burning just like 7-23.    When I say burning, I mean when I do or do not urinate - just all the time today, 7-28 on this the 3rd straight day of using cream.  Need your advice about when & number of times to use Cream as well as when to come back to your office.  I am scheduled for 8-5 [but I have another Dr appt on that day]   Thank you.  Barbette Hair

## 2018-12-17 NOTE — Telephone Encounter (Signed)
Dr. Talbert Nan -please review patients MyChart message and advise on Valisone ointment.   She is scheduled for f/u on 8/5 and 8/13, ok to cancel 8/5 OV?

## 2018-12-17 NOTE — Telephone Encounter (Signed)
VOID

## 2018-12-17 NOTE — Telephone Encounter (Signed)
Please call and speak with the patient. She had just started on an estrogen cream earlier this month. When I saw her on 7/20 I prescribed a steroid ointment. She is supposed to use a pea sized amount 2 x a day just on the area where her vulva is fused together. If I'm understanding her note that ointment is causing burning. If that's where she is burning, then I would recommend she space out to using the ointment 2-3 x a week.  Make sure it's not the estrogen cream that is burning. She should use that 2 x a week.  I would like to see her next week. If she can't come on the 5th change it to another day. Thanks!

## 2018-12-17 NOTE — Telephone Encounter (Signed)
Left message to call Kaitlyn at [phone]. 

## 2018-12-18 NOTE — Telephone Encounter (Signed)
Spoke with patient. Patient reports burning is coming from steroid ointment usage. Advised to start using twice a week. Patient verbalizes understanding. Appointment for 12/24/2018 at 4:30 pm kept. Patient is agreeable to date and time. Encounter closed.

## 2018-12-18 NOTE — Progress Notes (Deleted)
GYNECOLOGY  VISIT   HPI: 73 y.o.   Widowed White or Caucasian Not Hispanic or Latino  female   [phone] with No LMP recorded. Patient is postmenopausal.   here for     GYNECOLOGIC HISTORY: No LMP recorded. Patient is postmenopausal. Contraception:*** Menopausal hormone therapy: ***        OB History    Gravida  3   Para  3   Term  3   Preterm      AB      Living  2     SAB      TAB      Ectopic      Multiple      Live Births  2              Patient Active Problem List   Diagnosis Date Noted  . Atrophic vaginitis 10/20/2018  . Urinary bladder stone 08/06/2018  . Hematuria, gross 08/06/2018  . Edema 04/21/2018  . Herpes zoster 02/20/2018  . Occipital neuralgia of left side 01/07/2018  . Hormone replacement therapy (HRT) 07/25/2017  . Hair loss 05/31/2017  . Upper respiratory infection 05/01/2017  . Achilles tendinitis 03/12/2017  . Gout 01/09/2017  . Shoulder effusion, right 12/28/2016  . Neck stiffness 12/28/2016  . Heel pain, chronic, left 12/11/2016  . Calcaneal bursitis (heel), left 11/13/2016  . Toenail avulsion, initial encounter 11/13/2016  . Traumatic hematoma of foot, right, sequela 09/27/2016  . Adnexal mass 01/04/2016  . 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 Cataract 10/10/2015  . Abnormal abdominal x-ray 12/23/2014  . GERD (gastroesophageal reflux disease) 11/07/2012  . Hot flash, menopausal 09/10/2012  . Well adult exam 06/13/2011  . GAIT DISTURBANCE 11/22/2009  . PARESTHESIA 11/22/2009  . BACK PAIN, LUMBAR 11/03/2009  . Hypothyroidism 02/11/2007  . Dyslipidemia 02/11/2007  . Chronic anxiety 02/11/2007  . Depression 02/11/2007  . Essential hypertension 02/11/2007  . Coronary atherosclerosis 02/11/2007  . 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 CAROTID BRUIT 02/11/2007    Past Medical History:  Diagnosis Date  . Anxiety state, unspecified   . Bronchitis, acute   . Carotid bruit   . Coronary atherosclerosis of unspecified  type of vessel, native or graft    multiple angioplasy procedures in 90's/early 2000's  . De Quervain's tenosynovitis   . Depressive disorder, not elsewhere classified   . Female bladder prolapse   . Lichen 4010   Vaginal  . Other and unspecified hyperlipidemia   . Unspecified essential hypertension   . Unspecified hypothyroidism     Past Surgical History:  Procedure Laterality Date  . MOUTH SURGERY    . NOSE SURGERY  1999  . TOTAL VAGINAL HYSTERECTOMY  1994    Current Outpatient Medications  Medication Sig Dispense Refill  . aspirin 81 MG EC tablet Take 81 mg by mouth daily.      . betamethasone valerate ointment (VALISONE) 0.1 % Apply a pea sized amount topically BID for the next 2 weeks. Not for daily long term use. 30 g 0  . Cholecalciferol 1000 UNITS tablet Take 1,000 Units by mouth daily.      . clorazepate (TRANXENE) 15 MG tablet Take 1 tablet (15 mg total) by mouth 3 (three) times daily as needed. for anxiety 270 tablet 1  . doxepin (SINEQUAN) 50 MG capsule Take 3 capsules (150 mg total) by mouth at bedtime. 270 capsule 3  . estradiol (ESTRACE VAGINAL) 0.1 MG/GM vaginal cream Place 1 Applicatorful vaginally 3 (three) times a week.  42.5 g 12  . estradiol cypionate (DEPO-ESTRADIOL) 5 MG/ML injection Use 0.3 ml every 18-25 days IM 5 mL 3  . furosemide (LASIX) 40 MG tablet Take 1 tablet (40 mg total) by mouth 2 (two) times daily. 180 tablet 3  . levothyroxine (SYNTHROID, LEVOTHROID) 137 MCG tablet Take 1 tablet (137 mcg total) by mouth daily before breakfast. 30 tablet 11  . LORazepam (ATIVAN) 0.5 MG tablet Take 1 tablet (0.5 mg total) by mouth at bedtime. 90 tablet 1  . NIFEdipine (PROCARDIA-XL/NIFEDICAL-XL) 30 MG 24 hr tablet Take 1 tablet (30 mg total) by mouth daily. 90 tablet 3  . nitroGLYCERIN (NITROLINGUAL) 0.4 MG/SPRAY spray PLACE 1 SPRAY UNDER THE TONGUE EVERY 5 MINUTES AS NEEDED FOR CHEST PAIN. MAY REPEAT 3 TIMES (Patient not taking: Reported on 12/12/2018) 12 g 1  .  potassium chloride SA (KLOR-CON M20) 20 MEQ tablet Take 1 tablet (20 mEq total) by mouth 2 (two) times daily. 180 tablet 3   Current Facility-Administered Medications  Medication Dose Route Frequency Provider Last Rate Last Dose  . estradiol cypionate (DEPO-ESTRADIOL) 5 MG/ML injection 1.5 mg  1.5 mg Intramuscular Q30 days Plotnikov, Georgina QuintAleksei V, MD   1.5 mg at 11/24/18 [id number]     ALLERGIES: Epinephrine, Isosorbide mononitrate, Lidocaine, Morphine, Propoxyphene n-acetaminophen, Alprazolam, Atorvastatin, Codeine, Ezetimibe, Prilosec [omeprazole], Rosuvastatin, Simvastatin, Allopurinol, Meloxicam, Tetracycline, Amoxicillin, Ciprofloxacin, and Diphenhydramine hcl  Family History  Problem Relation Age of Onset  . Hypertension Mother   . Hypertension Father   . Hypertension Other   . Coronary artery disease Other     Social History   Socioeconomic History  . Marital status: Widowed    Spouse name: Not on file  . Number of children: Not on file  . Years of education: Not on file  . Highest education level: Not on file  Occupational History  . Not on file  Social Needs  . Financial resource strain: Not on file  . Food insecurity    Worry: Not on file    Inability: Not on file  . Transportation needs    Medical: Not on file    Non-medical: Not on file  Tobacco Use  . Smoking status: Former Smoker    Quit date: 07/22/1991    Years since quitting: 27.4  . Smokeless tobacco: Never Used  Substance and Sexual Activity  . Alcohol use: No  . Drug use: No  . Sexual activity: Not Currently    Birth control/protection: Surgical    Comment: partial hysterectomy  Lifestyle  . Physical activity    Days per week: Not on file    Minutes per session: Not on file  . Stress: Not on file  Relationships  . Social Musicianconnections    Talks on phone: Not on file    Gets together: Not on file    Attends religious service: Not on file    Active member of club or organization: Not on file    Attends  meetings of clubs or organizations: Not on file    Relationship status: Not on file  . Intimate partner violence    Fear of current or ex partner: Not on file    Emotionally abused: Not on file    Physically abused: Not on file    Forced sexual activity: Not on file  Other Topics Concern  . 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    General appearance: alert, cooperative and appears stated age Neck: no adenopathy, supple, symmetrical, trachea midline and thyroid {CHL AMB PHY EX THYROID NORM DEFAULT:[phone]::"normal to inspection and palpation"} Breasts: {Exam; breast:13139::"normal appearance, no masses or tenderness"} Abdomen: soft, non-tender; non distended, no masses,  no organomegaly  Pelvic: External genitalia:  no lesions              Urethra:  normal appearing urethra with no masses, tenderness or lesions              Bartholins and Skenes: normal                 Vagina: normal appearing vagina with normal color and discharge, no lesions              Cervix: {CHL AMB PHY EX CERVIX NORM DEFAULT:[phone]::"no lesions"}              Bimanual Exam:  Uterus:  {CHL AMB PHY EX UTERUS NORM [phone]::"normal size, contour, position, consistency, mobility, non-tender"}              Adnexa: {CHL AMB PHY EX ADNEXA NO MASS DEFAULT:[phone]::"no mass, fullness, tenderness"}              Rectovaginal: {yes no:[phone]}.  Confirms.              Anus:  normal sphincter tone, no lesions  Chaperone was present for exam.  ASSESSMENT     PLAN    An After Visit Summary was printed and given to the patient.  *** minutes face to face time of which over 50% was spent in counseling.

## 2018-12-22 ENCOUNTER — Other Ambulatory Visit: Payer: Self-pay

## 2018-12-24 ENCOUNTER — Ambulatory Visit: Payer: Medicare Other | Admitting: Internal Medicine

## 2018-12-24 ENCOUNTER — Ambulatory Visit: Payer: Medicare Other | Admitting: Obstetrics and Gynecology

## 2018-12-25 ENCOUNTER — Ambulatory Visit (INDEPENDENT_AMBULATORY_CARE_PROVIDER_SITE_OTHER): Payer: Medicare Other | Admitting: Internal Medicine

## 2018-12-25 ENCOUNTER — Other Ambulatory Visit: Payer: Self-pay

## 2018-12-25 ENCOUNTER — Encounter: Payer: Self-pay | Admitting: Internal Medicine

## 2018-12-25 DIAGNOSIS — F419 Anxiety disorder, unspecified: Secondary | ICD-10-CM | POA: Diagnosis not present

## 2018-12-25 DIAGNOSIS — I10 Essential (primary) hypertension: Secondary | ICD-10-CM

## 2018-12-25 DIAGNOSIS — Z79890 Hormone replacement therapy (postmenopausal): Secondary | ICD-10-CM | POA: Diagnosis not present

## 2018-12-25 DIAGNOSIS — E034 Atrophy of thyroid (acquired): Secondary | ICD-10-CM | POA: Diagnosis not present

## 2018-12-25 DIAGNOSIS — E785 Hyperlipidemia, unspecified: Secondary | ICD-10-CM

## 2018-12-25 MED ORDER — ESTRADIOL CYPIONATE 5 MG/ML IM OIL
1.5000 mg | TOPICAL_OIL | Freq: Once | INTRAMUSCULAR | Status: AC
  Administered 2018-12-25: 1.5 mg via INTRAMUSCULAR

## 2018-12-25 NOTE — Assessment & Plan Note (Signed)
Lorazepam prn, Doxepine at hs 

## 2018-12-25 NOTE — Assessment & Plan Note (Signed)
Will inject °

## 2018-12-25 NOTE — Progress Notes (Signed)
Subjective:  Patient ID: [name] StallRuby C [name], female    DOB: 03/04/1946  Age: 73 y.o. MRN: [id number]  CC: No chief complaint on file.   HPI [name] presents for menopausal sx's, arthritis, anxiety f/u C/o stress - flat tire yesterday, car electronics failure  Outpatient Medications Prior to Visit  Medication Sig Dispense Refill  . aspirin 81 MG EC tablet Take 81 mg by mouth daily.      . betamethasone valerate ointment (VALISONE) 0.1 % Apply a pea sized amount topically BID for the next 2 weeks. Not for daily long term use. 30 g 0  . Cholecalciferol 1000 UNITS tablet Take 1,000 Units by mouth daily.      . clorazepate (TRANXENE) 15 MG tablet Take 1 tablet (15 mg total) by mouth 3 (three) times daily as needed. for anxiety 270 tablet 1  . doxepin (SINEQUAN) 50 MG capsule Take 3 capsules (150 mg total) by mouth at bedtime. 270 capsule 3  . estradiol (ESTRACE VAGINAL) 0.1 MG/GM vaginal cream Place 1 Applicatorful vaginally 3 (three) times a week. 42.5 g 12  . estradiol cypionate (DEPO-ESTRADIOL) 5 MG/ML injection Use 0.3 ml every 18-25 days IM 5 mL 3  . furosemide (LASIX) 40 MG tablet Take 1 tablet (40 mg total) by mouth 2 (two) times daily. 180 tablet 3  . levothyroxine (SYNTHROID, LEVOTHROID) 137 MCG tablet Take 1 tablet (137 mcg total) by mouth daily before breakfast. 30 tablet 11  . LORazepam (ATIVAN) 0.5 MG tablet Take 1 tablet (0.5 mg total) by mouth at bedtime. 90 tablet 1  . NIFEdipine (PROCARDIA-XL/NIFEDICAL-XL) 30 MG 24 hr tablet Take 1 tablet (30 mg total) by mouth daily. 90 tablet 3  . nitroGLYCERIN (NITROLINGUAL) 0.4 MG/SPRAY spray PLACE 1 SPRAY UNDER THE TONGUE EVERY 5 MINUTES AS NEEDED FOR CHEST PAIN. MAY REPEAT 3 TIMES 12 g 1  . potassium chloride SA (KLOR-CON M20) 20 MEQ tablet Take 1 tablet (20 mEq total) by mouth 2 (two) times daily. 180 tablet 3   Facility-Administered Medications Prior to Visit  Medication Dose Route Frequency Provider Last Rate Last Dose  . estradiol  cypionate (DEPO-ESTRADIOL) 5 MG/ML injection 1.5 mg  1.5 mg Intramuscular Q30 days , Georgina QuintAleksei V, MD   1.5 mg at 11/24/18 0814    ROS: Review of Systems  Constitutional: Negative for activity change, appetite change, chills, fatigue and unexpected weight change.  HENT: Negative for congestion, mouth sores and sinus pressure.   Eyes: Negative for visual disturbance.  Respiratory: Negative for cough and chest tightness.   Gastrointestinal: Negative for abdominal pain and nausea.  Genitourinary: Negative for difficulty urinating, frequency and vaginal pain.  Musculoskeletal: Negative for back pain and gait problem.  Skin: Negative for pallor and rash.  Neurological: Negative for dizziness, tremors, weakness, numbness and headaches.  Psychiatric/Behavioral: Negative for confusion, sleep disturbance and suicidal ideas. The patient is nervous/anxious.     Objective:  BP (!) 150/80 (BP Location: Left Arm, Patient Position: Sitting, Cuff Size: Normal)   Pulse 85   Temp (!) 97.4 F (36.3 C) (Oral)   Ht 5\' 8"  (1.727 m)   Wt 159 lb (72.1 kg)   SpO2 97%   BMI 24.18 kg/m   BP Readings from Last 3 Encounters:  12/25/18 (!) 150/80  12/12/18 130/72  12/08/18 138/80    Wt Readings from Last 3 Encounters:  12/25/18 159 lb (72.1 kg)  12/12/18 162 lb (73.5 kg)  12/08/18 162 lb (73.5 kg)    Physical Exam  Constitutional:      General: She is not in acute distress.    Appearance: She is well-developed.  HENT:     Head: Normocephalic.     Right Ear: External ear normal.     Left Ear: External ear normal.     Nose: Nose normal.  Eyes:     General:        Right eye: No discharge.        Left eye: No discharge.     Conjunctiva/sclera: Conjunctivae normal.     Pupils: Pupils are equal, round, and reactive to light.  Neck:     Musculoskeletal: Normal range of motion and neck supple.     Thyroid: No thyromegaly.     Vascular: No JVD.     Trachea: No tracheal deviation.   Cardiovascular:     Rate and Rhythm: Normal rate and regular rhythm.     Heart sounds: Normal heart sounds.  Pulmonary:     Effort: No respiratory distress.     Breath sounds: No stridor. No wheezing.  Abdominal:     General: Bowel sounds are normal. There is no distension.     Palpations: Abdomen is soft. There is no mass.     Tenderness: There is no abdominal tenderness. There is no guarding or rebound.  Musculoskeletal:        General: No tenderness.  Lymphadenopathy:     Cervical: No cervical adenopathy.  Skin:    Findings: No erythema or rash.  Neurological:     Cranial Nerves: No cranial nerve deficit.     Motor: No abnormal muscle tone.     Coordination: Coordination normal.     Deep Tendon Reflexes: Reflexes normal.  Psychiatric:        Behavior: Behavior normal.        Thought Content: Thought content normal.        Judgment: Judgment normal.   A little anxious  Lab Results  Component Value Date   WBC 4.8 07/29/2018   HGB 12.4 07/29/2018   HCT 36.9 07/29/2018   PLT 239.0 07/29/2018   GLUCOSE 91 07/29/2018   CHOL 236 (H) 11/02/2016   TRIG 100.0 11/02/2016   HDL 60.60 11/02/2016   LDLDIRECT 188.2 01/10/2012   LDLCALC 155 (H) 11/02/2016   ALT 14 07/29/2018   AST 19 07/29/2018   NA 139 07/29/2018   K 3.8 07/29/2018   CL 103 07/29/2018   CREATININE 1.11 07/29/2018   BUN 14 07/29/2018   CO2 26 07/29/2018   TSH 0.15 (L) 07/17/2018   INR 1.0 08/01/2018    Ct Renal Stone Study  Result Date: 08/01/2018 CLINICAL DATA:  Hematuria, loose stools EXAM: CT ABDOMEN AND PELVIS WITHOUT CONTRAST TECHNIQUE: Multidetector CT imaging of the abdomen and pelvis was performed following the standard protocol without IV contrast. COMPARISON:  12/24/2014 FINDINGS: Lower chest: No acute abnormality. Coronary artery calcifications. Hepatobiliary: No focal liver abnormality is seen. No gallstones, gallbladder wall thickening, or biliary dilatation. Pancreas: Unremarkable. No  pancreatic ductal dilatation or surrounding inflammatory changes. Spleen: Normal in size without focal abnormality. Adrenals/Urinary Tract: Adrenal glands are unremarkable. Kidneys are normal, without renal calculi, focal lesion, or hydronephrosis. Multiple small calculi in the dependent urinary bladder as seen on prior examination. Stomach/Bowel: Stomach is within normal limits. Appendix appears normal. No evidence of bowel wall thickening, distention, or inflammatory changes. Vascular/Lymphatic: Calcific atherosclerosis. No enlarged abdominal or pelvic lymph nodes. Reproductive: No mass or other abnormality. Status post hysterectomy. Other: No abdominal  wall hernia or abnormality. No abdominopelvic ascites. Musculoskeletal: No acute or significant osseous findings. IMPRESSION: 1. No acute noncontrast CT findings of the abdomen or pelvis to explain hematuria or loose stools. 2. Multiple small calculi within the dependent urinary bladder, similar to prior study. Electronically Signed   By: Lauralyn PrimesAlex  Bibbey M.D.   On: 08/01/2018 15:42    Assessment & Plan:   There are no diagnoses linked to this encounter.   No orders of the defined types were placed in this encounter.    Follow-up: No follow-ups on file.  Sonda PrimesAlex , MD

## 2018-12-25 NOTE — Assessment & Plan Note (Signed)
Toprol, Nifedipine 

## 2018-12-25 NOTE — Assessment & Plan Note (Signed)
Levothroid 

## 2018-12-29 ENCOUNTER — Encounter: Payer: Self-pay | Admitting: Obstetrics and Gynecology

## 2018-12-29 ENCOUNTER — Ambulatory Visit (INDEPENDENT_AMBULATORY_CARE_PROVIDER_SITE_OTHER): Payer: Medicare Other | Admitting: Obstetrics and Gynecology

## 2018-12-29 ENCOUNTER — Other Ambulatory Visit: Payer: Self-pay

## 2018-12-29 VITALS — BP 132/80 | HR 72 | Temp 98.0°F | Wt 160.0 lb

## 2018-12-29 DIAGNOSIS — L900 Lichen sclerosus et atrophicus: Secondary | ICD-10-CM | POA: Diagnosis not present

## 2018-12-29 DIAGNOSIS — N8111 Cystocele, midline: Secondary | ICD-10-CM

## 2018-12-29 NOTE — Progress Notes (Signed)
GYNECOLOGY  VISIT   HPI: 73 y.o.   Widowed White or Caucasian Not Hispanic or Latino  female   [phone] with No LMP recorded. Patient is postmenopausal.   here for 2 week follow up lichen sclerosus.  Patient has been having increased burning with using betamethasone ointment. Last used betamethasone ointment last week. Wearing panty liners due to hemorrhoids and concerns about staining underwear. Has attempted to change under garments for relief without success.   She was having too much burning with the steroid ointment so she stopped using it. She intermittently feels some burning when she urinates, not sure if it's internal or external. She is following the vulvar skin care sheet as best she can. She doesn't like the cotton underwear.  She has hemorrhoids, hard to clean so she wears pads.  She tried vaginal estrogen, it also caused burning so she stopped using it.   She has a large grade 2 cystocele. Towards the end of the day she notices it. There was concern if she was emptying her bladder, she had a normal PVR.   She is on ERT, h/o TVH.    GYNECOLOGIC HISTORY: No LMP recorded. Patient is postmenopausal. Contraception: Postmenopausal Menopausal hormone therapy: depo estradiol injection        OB History    Gravida  3   Para  3   Term  3   Preterm      AB      Living  2     SAB      TAB      Ectopic      Multiple      Live Births  2              Patient Active Problem List   Diagnosis Date Noted  . Atrophic vaginitis 10/20/2018  . Urinary bladder stone 08/06/2018  . Hematuria, gross 08/06/2018  . Edema 04/21/2018  . Herpes zoster 02/20/2018  . Occipital neuralgia of left side 01/07/2018  . Hormone replacement therapy (HRT) 07/25/2017  . Hair loss 05/31/2017  . Upper respiratory infection 05/01/2017  . Achilles tendinitis 03/12/2017  . Gout 01/09/2017  . Shoulder effusion, right 12/28/2016  . Neck stiffness 12/28/2016  . Heel pain, chronic, left  12/11/2016  . Calcaneal bursitis (heel), left 11/13/2016  . Toenail avulsion, initial encounter 11/13/2016  . Traumatic hematoma of foot, right, sequela 09/27/2016  . Adnexal mass 01/04/2016  . Right adrenal mass (HCC) 12/29/2015  . Cataract 10/10/2015  . Abnormal abdominal x-ray 12/23/2014  . GERD (gastroesophageal reflux disease) 11/07/2012  . Hot flash, menopausal 09/10/2012  . Well adult exam 06/13/2011  . GAIT DISTURBANCE 11/22/2009  . PARESTHESIA 11/22/2009  . BACK PAIN, LUMBAR 11/03/2009  . Hypothyroidism 02/11/2007  . Dyslipidemia 02/11/2007  . Chronic anxiety 02/11/2007  . Depression 02/11/2007  . Essential hypertension 02/11/2007  . Coronary atherosclerosis 02/11/2007  . 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 CAROTID BRUIT 02/11/2007    Past Medical History:  Diagnosis Date  . Anxiety state, unspecified   . Bronchitis, acute   . Carotid bruit   . Coronary atherosclerosis of unspecified type of vessel, native or graft    multiple angioplasy procedures in 90's/early 2000's  . De Quervain's tenosynovitis   . Depressive disorder, not elsewhere classified   . Female bladder prolapse   . Lichen 2011   Vaginal  . Other and unspecified hyperlipidemia   . Unspecified essential hypertension   . Unspecified hypothyroidism     Past Surgical  History:  Procedure Laterality Date  . MOUTH SURGERY    . NOSE SURGERY  1999  . TOTAL VAGINAL HYSTERECTOMY  1994    Current Outpatient Medications  Medication Sig Dispense Refill  . aspirin 81 MG EC tablet Take 81 mg by mouth daily.      . betamethasone valerate ointment (VALISONE) 0.1 % Apply a pea sized amount topically BID for the next 2 weeks. Not for daily long term use. 30 g 0  . Cholecalciferol 1000 UNITS tablet Take 1,000 Units by mouth daily.      . clorazepate (TRANXENE) 15 MG tablet Take 1 tablet (15 mg total) by mouth 3 (three) times daily as needed. for anxiety 270 tablet 1  . doxepin (SINEQUAN) 50 MG capsule  Take 3 capsules (150 mg total) by mouth at bedtime. 270 capsule 3  . estradiol cypionate (DEPO-ESTRADIOL) 5 MG/ML injection Use 0.3 ml every 18-25 days IM 5 mL 3  . furosemide (LASIX) 40 MG tablet Take 1 tablet (40 mg total) by mouth 2 (two) times daily. 180 tablet 3  . levothyroxine (SYNTHROID, LEVOTHROID) 137 MCG tablet Take 1 tablet (137 mcg total) by mouth daily before breakfast. 30 tablet 11  . LORazepam (ATIVAN) 0.5 MG tablet Take 1 tablet (0.5 mg total) by mouth at bedtime. 90 tablet 1  . NIFEdipine (PROCARDIA-XL/NIFEDICAL-XL) 30 MG 24 hr tablet Take 1 tablet (30 mg total) by mouth daily. 90 tablet 3  . nitroGLYCERIN (NITROLINGUAL) 0.4 MG/SPRAY spray PLACE 1 SPRAY UNDER THE TONGUE EVERY 5 MINUTES AS NEEDED FOR CHEST PAIN. MAY REPEAT 3 TIMES 12 g 1  . potassium chloride SA (KLOR-CON M20) 20 MEQ tablet Take 1 tablet (20 mEq total) by mouth 2 (two) times daily. 180 tablet 3   Current Facility-Administered Medications  Medication Dose Route Frequency Provider Last Rate Last Dose  . estradiol cypionate (DEPO-ESTRADIOL) 5 MG/ML injection 1.5 mg  1.5 mg Intramuscular Q30 days Plotnikov, Georgina QuintAleksei V, MD   1.5 mg at 11/24/18 [id number]     ALLERGIES: Epinephrine, Isosorbide mononitrate, Lidocaine, Morphine, Propoxyphene n-acetaminophen, Alprazolam, Atorvastatin, Codeine, Ezetimibe, Prilosec [omeprazole], Rosuvastatin, Simvastatin, Allopurinol, Meloxicam, Tetracycline, Amoxicillin, Ciprofloxacin, and Diphenhydramine hcl  Family History  Problem Relation Age of Onset  . Hypertension Mother   . Hypertension Father   . Hypertension Other   . Coronary artery disease Other     Social History   Socioeconomic History  . Marital status: Widowed    Spouse name: Not on file  . Number of children: Not on file  . Years of education: Not on file  . Highest education level: Not on file  Occupational History  . Not on file  Social Needs  . Financial resource strain: Not on file  . Food insecurity     Worry: Not on file    Inability: Not on file  . Transportation needs    Medical: Not on file    Non-medical: Not on file  Tobacco Use  . Smoking status: Former Smoker    Quit date: 07/22/1991    Years since quitting: 27.4  . Smokeless tobacco: Never Used  Substance and Sexual Activity  . Alcohol use: No  . Drug use: No  . Sexual activity: Not Currently    Birth control/protection: Surgical    Comment: partial hysterectomy  Lifestyle  . Physical activity    Days per week: Not on file    Minutes per session: Not on file  . Stress: Not on file  Relationships  . Social connections  Talks on phone: Not on file    Gets together: Not on file    Attends religious service: Not on file    Active member of club or organization: Not on file    Attends meetings of clubs or organizations: Not on file    Relationship status: Not on file  . Intimate partner violence    Fear of current or ex partner: Not on file    Emotionally abused: Not on file    Physically abused: Not on file    Forced sexual activity: Not on file  Other Topics Concern  . Not on file  Social History Narrative   Regular Exercise -  YES, [employer], 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    Review of Systems  Constitutional: Negative.   HENT: Negative.   Eyes: Negative.   Respiratory: Negative.   Cardiovascular: Negative.   Gastrointestinal: Negative.   Genitourinary: Negative.   Musculoskeletal: Negative.   Skin: Negative.   Neurological: Negative.   Endo/Heme/Allergies: Negative.   Psychiatric/Behavioral: Negative.     PHYSICAL EXAMINATION:    BP 132/80 (BP Location: Right Arm, Patient Position: Sitting, Cuff Size: Normal)   Pulse 72   Temp 98 F (36.7 C) (Skin)   Wt 160 lb (72.6 kg)   BMI 24.33 kg/m     General appearance: alert, cooperative and appears stated age  Pelvic: External genitalia:  Loss of architecture of the vulva, the labia are fused in the midline over her clitoris. Mild whitening,  no plaques. There is one small fissure on the mid right vulva, near her vaginal introitus.               Urethra:  normal appearing urethra with no masses, tenderness or lesions              Bartholins and Skenes: normal                 Vagina: large grade 2 cystocele, mild atrophy.  Chaperone was present for exam.  ASSESSMENT Lichen sclerosis, stable, unable to tolerate steroid ointment Cystocele Mild vaginal atrophy    PLAN She was stressed about the lichen sclerosis diagnosis, we discussed lichen sclerosis. Recommended yearly vulvar exams. Tried to reassure her.  She will continue to work on vulvar skin care, use Vaseline externally She will return for a pessary fitting   An After Visit Summary was printed and given to the patient.  Over 25 minutes face to face time of which over 50% was spent in counseling.   CC: Dr Alain Marion

## 2019-01-01 ENCOUNTER — Ambulatory Visit: Payer: Medicare Other | Admitting: Obstetrics and Gynecology

## 2019-01-13 ENCOUNTER — Ambulatory Visit: Payer: Medicare Other | Admitting: Obstetrics and Gynecology

## 2019-01-14 ENCOUNTER — Telehealth: Payer: Self-pay | Admitting: Obstetrics and Gynecology

## 2019-01-14 ENCOUNTER — Encounter: Payer: Self-pay | Admitting: Obstetrics and Gynecology

## 2019-01-14 MED ORDER — CLOBETASOL PROPIONATE 0.05 % EX OINT: 1.0000 "application " | TOPICAL_OINTMENT | Freq: Two times a day (BID) | CUTANEOUS | 0 refills | Status: DC

## 2019-01-14 NOTE — Telephone Encounter (Signed)
Patient returned call. Patient requesting to try prescription of Clobetasol ointment. Pharmacy confirmed as Paediatric nurse on [address]. RN advised would send to Dr. Talbert Nan and return call if any additional recommendations. Patient agreeable.   Routing to provider to review and advise on prescription.

## 2019-01-14 NOTE — Telephone Encounter (Signed)
Patient sent the following correspondence through [place].  01-13-2019  Dr Talbert Nan,  Given the issues I had with Betamethasone Valerate Ointment - to the point I am unable to use it, would you please give me a chance to use Clobetasol Propionate 0.05% ointment since I have used it successfully in the past when prescribed by Dr. Philis Pique?    Since Clobetasol Propionate 0.05% is used for the diagnosis you gave me, I don't see that I have anything to lose by trying it - and who knows, it just might work?    I don't remember bad reactions to it before.    I had to delay the pessary appointment due to family illness but wanted to make this request hoping you will call in a prescription for it to [facility] on [address], please.    This will allow me to give it a try while I am waiting the outcome of out of town family health issues.    I just want to give it a try for possible success.  Thank you.  [name]

## 2019-01-14 NOTE — Telephone Encounter (Signed)
Script sent, [employer] sent. Please close encounter (it is locked).

## 2019-01-14 NOTE — Telephone Encounter (Signed)
Message left to return call to Triage Nurse at [phone].    

## 2019-01-14 NOTE — Telephone Encounter (Signed)
Patient called back and stated that she looked up the clobetasol and that her insurance would not cover the medication and "it's expensive." Patient states she has been looking up the medication on [facility] and the 15g tube of clobetasol will be $20.86 at [facility] and "I can handle that." Patient states no harsh reactions to clobetasol in the past. Patient states she is not having any itching, just an aggravated feeling- mostly burning. Patient requests if Dr. Talbert Nan okay with sending in the clobetasol to send in the 15g so it will be affordable for her. RN advised message sent to Dr. Talbert Nan for review. Patient aware Dr. Talbert Nan is seeing patient's so response might not be immediate. Patient agreeable.   Routing to provider for review.

## 2019-01-15 NOTE — Telephone Encounter (Signed)
Encounter closed per Dr. Talbert Nan.

## 2019-01-22 ENCOUNTER — Ambulatory Visit (INDEPENDENT_AMBULATORY_CARE_PROVIDER_SITE_OTHER): Payer: Medicare Other | Admitting: Internal Medicine

## 2019-01-22 ENCOUNTER — Encounter: Payer: Self-pay | Admitting: Internal Medicine

## 2019-01-22 ENCOUNTER — Other Ambulatory Visit: Payer: Self-pay

## 2019-01-22 DIAGNOSIS — N952 Postmenopausal atrophic vaginitis: Secondary | ICD-10-CM

## 2019-01-22 DIAGNOSIS — F419 Anxiety disorder, unspecified: Secondary | ICD-10-CM | POA: Diagnosis not present

## 2019-01-22 DIAGNOSIS — N951 Menopausal and female climacteric states: Secondary | ICD-10-CM

## 2019-01-22 DIAGNOSIS — I10 Essential (primary) hypertension: Secondary | ICD-10-CM | POA: Diagnosis not present

## 2019-01-22 DIAGNOSIS — M10072 Idiopathic gout, left ankle and foot: Secondary | ICD-10-CM | POA: Diagnosis not present

## 2019-01-22 MED ORDER — ESTRADIOL CYPIONATE 5 MG/ML IM OIL
1.5000 mg | TOPICAL_OIL | Freq: Once | INTRAMUSCULAR | Status: AC
  Administered 2019-01-22: 1.5 mg via INTRAMUSCULAR

## 2019-01-22 NOTE — Assessment & Plan Note (Signed)
Stable

## 2019-01-22 NOTE — Assessment & Plan Note (Signed)
F/u w/GYN  Estrogen vaginal cream

## 2019-01-22 NOTE — Assessment & Plan Note (Signed)
Lorazepam prn, Doxepine at hs Pt taking meds at night (not when driving)  Potential benefits of a long term benzodiazepines  use as well as potential risks  and complications were explained to the patient and were aknowledged.

## 2019-01-22 NOTE — Progress Notes (Signed)
Subjective:  Patient ID: [name], female    DOB: 1946/01/16  Age: 73 y.o. MRN: [id number]  CC: No chief complaint on file.   HPI MARLAINA COBURN presents for sweats, insomnia, anxiety f/u  Outpatient Medications Prior to Visit  Medication Sig Dispense Refill  . aspirin 81 MG EC tablet Take 81 mg by mouth daily.      . Cholecalciferol 1000 UNITS tablet Take 1,000 Units by mouth daily.      . clobetasol ointment (TEMOVATE) 0.86 % Apply 1 application topically 2 (two) times daily. Apply as directed twice daily for up to 2 weeks, then space out to 1-2 x a week. Not for daily long term use. 30 g 0  . clorazepate (TRANXENE) 15 MG tablet Take 1 tablet (15 mg total) by mouth 3 (three) times daily as needed. for anxiety 270 tablet 1  . doxepin (SINEQUAN) 50 MG capsule Take 3 capsules (150 mg total) by mouth at bedtime. 270 capsule 3  . estradiol cypionate (DEPO-ESTRADIOL) 5 MG/ML injection Use 0.3 ml every 18-25 days IM 5 mL 3  . furosemide (LASIX) 40 MG tablet Take 1 tablet (40 mg total) by mouth 2 (two) times daily. 180 tablet 3  . levothyroxine (SYNTHROID, LEVOTHROID) 137 MCG tablet Take 1 tablet (137 mcg total) by mouth daily before breakfast. 30 tablet 11  . LORazepam (ATIVAN) 0.5 MG tablet Take 1 tablet (0.5 mg total) by mouth at bedtime. 90 tablet 1  . NIFEdipine (PROCARDIA-XL/NIFEDICAL-XL) 30 MG 24 hr tablet Take 1 tablet (30 mg total) by mouth daily. 90 tablet 3  . nitroGLYCERIN (NITROLINGUAL) 0.4 MG/SPRAY spray PLACE 1 SPRAY UNDER THE TONGUE EVERY 5 MINUTES AS NEEDED FOR CHEST PAIN. MAY REPEAT 3 TIMES 12 g 1  . potassium chloride SA (KLOR-CON M20) 20 MEQ tablet Take 1 tablet (20 mEq total) by mouth 2 (two) times daily. 180 tablet 3   Facility-Administered Medications Prior to Visit  Medication Dose Route Frequency Provider Last Rate Last Dose  . estradiol cypionate (DEPO-ESTRADIOL) 5 MG/ML injection 1.5 mg  1.5 mg Intramuscular Q30 days Plotnikov, Evie Lacks, MD   1.5 mg at 11/24/18 0814     ROS: Review of Systems  Constitutional: Positive for diaphoresis and fatigue. Negative for activity change, appetite change, chills and unexpected weight change.  HENT: Negative for congestion, mouth sores and sinus pressure.   Eyes: Negative for visual disturbance.  Respiratory: Negative for cough and chest tightness.   Gastrointestinal: Negative for abdominal pain and nausea.  Genitourinary: Negative for difficulty urinating, frequency and vaginal pain.  Musculoskeletal: Positive for arthralgias. Negative for back pain and gait problem.  Skin: Negative for pallor and rash.  Neurological: Negative for dizziness, tremors, weakness, numbness and headaches.  Psychiatric/Behavioral: Negative for confusion, sleep disturbance and suicidal ideas. The patient is nervous/anxious.     Objective:  BP 140/80 (BP Location: Left Arm, Patient Position: Sitting, Cuff Size: Normal)   Pulse 76   Temp 97.6 F (36.4 C) (Oral)   Ht 5\' 8"  (1.727 m)   Wt 158 lb (71.7 kg)   SpO2 97%   BMI 24.02 kg/m   BP Readings from Last 3 Encounters:  01/22/19 140/80  12/29/18 132/80  12/25/18 (!) 150/80    Wt Readings from Last 3 Encounters:  01/22/19 158 lb (71.7 kg)  12/29/18 160 lb (72.6 kg)  12/25/18 159 lb (72.1 kg)    Physical Exam Constitutional:      General: She is not in acute distress.  Appearance: She is well-developed.  HENT:     Head: Normocephalic.     Right Ear: External ear normal.     Left Ear: External ear normal.     Nose: Nose normal.  Eyes:     General:        Right eye: No discharge.        Left eye: No discharge.     Conjunctiva/sclera: Conjunctivae normal.     Pupils: Pupils are equal, round, and reactive to light.  Neck:     Musculoskeletal: Normal range of motion and neck supple.     Thyroid: No thyromegaly.     Vascular: No JVD.     Trachea: No tracheal deviation.  Cardiovascular:     Rate and Rhythm: Normal rate and regular rhythm.     Heart sounds: Normal  heart sounds.  Pulmonary:     Effort: No respiratory distress.     Breath sounds: No stridor. No wheezing.  Abdominal:     General: Bowel sounds are normal. There is no distension.     Palpations: Abdomen is soft. There is no mass.     Tenderness: There is no abdominal tenderness. There is no guarding or rebound.  Musculoskeletal:        General: No tenderness.  Lymphadenopathy:     Cervical: No cervical adenopathy.  Skin:    Findings: No erythema or rash.  Neurological:     Mental Status: She is oriented to person, place, and time.     Cranial Nerves: No cranial nerve deficit.     Motor: No abnormal muscle tone.     Coordination: Coordination normal.     Deep Tendon Reflexes: Reflexes normal.  Psychiatric:        Behavior: Behavior normal.        Thought Content: Thought content normal.        Judgment: Judgment normal.   anxious  Lab Results  Component Value Date   WBC 4.8 07/29/2018   HGB 12.4 07/29/2018   HCT 36.9 07/29/2018   PLT 239.0 07/29/2018   GLUCOSE 91 07/29/2018   CHOL 236 (H) 11/02/2016   TRIG 100.0 11/02/2016   HDL 60.60 11/02/2016   LDLDIRECT 188.2 01/10/2012   LDLCALC 155 (H) 11/02/2016   ALT 14 07/29/2018   AST 19 07/29/2018   NA 139 07/29/2018   K 3.8 07/29/2018   CL 103 07/29/2018   CREATININE 1.11 07/29/2018   BUN 14 07/29/2018   CO2 26 07/29/2018   TSH 0.15 (L) 07/17/2018   INR 1.0 08/01/2018    Ct Renal Stone Study  Result Date: 08/01/2018 CLINICAL DATA:  Hematuria, loose stools EXAM: CT ABDOMEN AND PELVIS WITHOUT CONTRAST TECHNIQUE: Multidetector CT imaging of the abdomen and pelvis was performed following the standard protocol without IV contrast. COMPARISON:  12/24/2014 FINDINGS: Lower chest: No acute abnormality. Coronary artery calcifications. Hepatobiliary: No focal liver abnormality is seen. No gallstones, gallbladder wall thickening, or biliary dilatation. Pancreas: Unremarkable. No pancreatic ductal dilatation or surrounding  inflammatory changes. Spleen: Normal in size without focal abnormality. Adrenals/Urinary Tract: Adrenal glands are unremarkable. Kidneys are normal, without renal calculi, focal lesion, or hydronephrosis. Multiple small calculi in the dependent urinary bladder as seen on prior examination. Stomach/Bowel: Stomach is within normal limits. Appendix appears normal. No evidence of bowel wall thickening, distention, or inflammatory changes. Vascular/Lymphatic: Calcific atherosclerosis. No enlarged abdominal or pelvic lymph nodes. Reproductive: No mass or other abnormality. Status post hysterectomy. Other: No abdominal wall hernia or abnormality.  No abdominopelvic ascites. Musculoskeletal: No acute or significant osseous findings. IMPRESSION: 1. No acute noncontrast CT findings of the abdomen or pelvis to explain hematuria or loose stools. 2. Multiple small calculi within the dependent urinary bladder, similar to prior study. Electronically Signed   By: Lauralyn PrimesAlex  Bibbey M.D.   On: 08/01/2018 15:42    Assessment & Plan:   There are no diagnoses linked to this encounter.   No orders of the defined types were placed in this encounter.    Follow-up: No follow-ups on file.  Sonda PrimesAlex Plotnikov, MD

## 2019-01-22 NOTE — Assessment & Plan Note (Signed)
Toprol, Nifedipine 

## 2019-01-22 NOTE — Assessment & Plan Note (Signed)
Depo-Estradiol 0.3 ml  Potential benefits of a long term HRT  use as well as potential risks  and complications were explained to the patient and were aknowledged. 

## 2019-01-22 NOTE — Addendum Note (Signed)
Addended by: Cresenciano Lick on: 01/22/2019 02:57 PM   Modules accepted: Orders

## 2019-01-28 NOTE — Progress Notes (Signed)
Pt supplied her own medication.  

## 2019-01-28 NOTE — Progress Notes (Signed)
Pt supplied her own medication.

## 2019-02-06 ENCOUNTER — Encounter: Payer: Self-pay | Admitting: Internal Medicine

## 2019-02-06 ENCOUNTER — Other Ambulatory Visit: Payer: Self-pay

## 2019-02-06 ENCOUNTER — Ambulatory Visit (INDEPENDENT_AMBULATORY_CARE_PROVIDER_SITE_OTHER): Payer: Medicare Other | Admitting: Internal Medicine

## 2019-02-06 VITALS — BP 140/90 | HR 85 | Temp 97.8°F | Ht 68.0 in | Wt 158.0 lb

## 2019-02-06 DIAGNOSIS — M79601 Pain in right arm: Secondary | ICD-10-CM | POA: Diagnosis not present

## 2019-02-06 NOTE — Assessment & Plan Note (Signed)
Due to dog bite. Advised to use tylenol for pain. Given appearance and cleaning she did after as well as location antibiotic prevention is not indicated. Dog is up to date on shots so rabies assessment not indicated. Tetanus up to date. No signs of infection on exam. Advised on wound care and signs to watch for with infection.

## 2019-02-06 NOTE — Progress Notes (Signed)
   Subjective:   Patient ID: KENISHIA PLACK, female    DOB: 03-22-1946, 73 y.o.   MRN: [id number]  HPI The patient is a 73 YO female coming in for concerns about dog bite. Happened yesterday around noon. Was still bleeding around lunchtime today and she does take aspirin daily. She is concerned about it due to the bleeding. It was her dog and she was trying to put a raincoat on it due to rain outside. She tried to back away but did not fast enough. Dog has never been aggressive or bitten before. She is not sure if she will keep it. It is up to date on vaccinations. Denies numbness in hand or arm. Admits to pain which is improving today. Originally 10/10 pain and now 4/10. Did take tylenol for pain.   Review of Systems  Constitutional: Negative.   HENT: Negative.   Respiratory: Negative for cough, chest tightness and shortness of breath.   Cardiovascular: Negative for chest pain, palpitations and leg swelling.  Gastrointestinal: Negative for abdominal distention, abdominal pain, constipation, diarrhea, nausea and vomiting.  Musculoskeletal: Negative.   Skin: Positive for wound.  Neurological: Negative.     Objective:  Physical Exam Constitutional:      Appearance: She is well-developed.  HENT:     Head: Normocephalic and atraumatic.  Neck:     Musculoskeletal: Normal range of motion.  Cardiovascular:     Rate and Rhythm: Normal rate and regular rhythm.  Pulmonary:     Effort: Pulmonary effort is normal. No respiratory distress.     Breath sounds: Normal breath sounds. No wheezing or rales.  Abdominal:     General: Bowel sounds are normal. There is no distension.     Palpations: Abdomen is soft.     Tenderness: There is no abdominal tenderness. There is no rebound.  Skin:    General: Skin is warm and dry.     Comments: Puncture marks on the right forearm and underneath of arm with some bruising around the puncture marks. The main wound is about 2 cm v formation with skin tear, no  oozing at this time. No signs of infection or puncture. This is not close to the elbow joint.  Neurological:     Mental Status: She is alert and oriented to person, place, and time.     Coordination: Coordination normal.     Vitals:   02/06/19 1411  BP: 140/90  Pulse: 85  Temp: 97.8 F (36.6 C)  TempSrc: Oral  SpO2: 95%  Weight: 158 lb (71.7 kg)  Height: 5\' 8"  (1.727 m)    Assessment & Plan:

## 2019-02-06 NOTE — Patient Instructions (Signed)
You can use antibiotic ointment with a bandage for the wound.   If you notice redness around the wound or yellow cloudy drainage call or come back.   This will likely take several weeks to heal.

## 2019-02-09 ENCOUNTER — Ambulatory Visit: Payer: Medicare Other | Admitting: Family

## 2019-02-10 ENCOUNTER — Telehealth: Payer: Self-pay | Admitting: Internal Medicine

## 2019-02-10 NOTE — Telephone Encounter (Signed)
Pt states someone called her back and she is returning call.

## 2019-02-10 NOTE — Telephone Encounter (Signed)
Pt called and stated that she would like to know if mupirocin ointment (BACTROBAN) 2 % [[id number]] for dog bite wound. Pt was seen on 02/06/19 for dog bite. Pt states that she is allergic to A&D ointment.   Pharmacy:  [facility] [address], [place] [phone] (Phone) [phone] (Fax)

## 2019-02-10 NOTE — Telephone Encounter (Signed)
Can just use vaseline on the wound to prevent bandage from sticking. Or can use neosporin also.

## 2019-02-10 NOTE — Telephone Encounter (Signed)
Called patient states that she is allergic to all the A&D, neosporin, vaseline, and the ABX ointment that was put on her at the appointment. Patient wanted me to send the message to Dr. Alain Marion to get this ointment renewed since it is the only thing that works for her and does not cause any rashes

## 2019-02-11 ENCOUNTER — Encounter: Payer: Self-pay | Admitting: Internal Medicine

## 2019-02-11 NOTE — Telephone Encounter (Signed)
Pt sent mychart message, that was routed to PCP

## 2019-02-11 NOTE — Telephone Encounter (Signed)
Is it clobetasol or triamcinolone? Thx

## 2019-02-12 ENCOUNTER — Encounter: Payer: Self-pay | Admitting: Internal Medicine

## 2019-02-12 ENCOUNTER — Other Ambulatory Visit: Payer: Self-pay

## 2019-02-12 ENCOUNTER — Ambulatory Visit (INDEPENDENT_AMBULATORY_CARE_PROVIDER_SITE_OTHER): Payer: Medicare Other | Admitting: Internal Medicine

## 2019-02-12 DIAGNOSIS — W540XXD Bitten by dog, subsequent encounter: Secondary | ICD-10-CM | POA: Diagnosis not present

## 2019-02-12 DIAGNOSIS — M79601 Pain in right arm: Secondary | ICD-10-CM

## 2019-02-12 DIAGNOSIS — S41151D Open bite of right upper arm, subsequent encounter: Secondary | ICD-10-CM | POA: Diagnosis not present

## 2019-02-12 MED ORDER — MUPIROCIN 2 % EX OINT: TOPICAL_OINTMENT | CUTANEOUS | 0 refills | Status: DC

## 2019-02-12 NOTE — Patient Instructions (Addendum)
  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 On the [employer] .   [employer] [email or web address] Intelligent and creative dog training techniques to help you manage, motivate, and learn about your beloved canine family members. ?About  ?Training  ?Resources  ?Contact   Clean wound with soap and water, pat dry with Kleenex tissue. Dress with TELFA pad and Mupirocin 2% once ore twice a day

## 2019-02-12 NOTE — Telephone Encounter (Signed)
Called patient.  Appointment scheduled with Dr Alain Marion this afternoon.  Work in okay per Dr Alain Marion.

## 2019-02-12 NOTE — Progress Notes (Signed)
Subjective:  Patient ID: [name], female    DOB: Mar 06, 1946  Age: 73 y.o. MRN: [id number]  CC: No chief complaint on file.   HPI [name] presents for a dog bite - On Friday last week C/o reaction to the triple abx oint.  The patient is very anxious about her wound.  She thinks it is getting worse  Outpatient Medications Prior to Visit  Medication Sig Dispense Refill  . aspirin 81 MG EC tablet Take 81 mg by mouth daily.      . Cholecalciferol 1000 UNITS tablet Take 1,000 Units by mouth daily.      . clobetasol ointment (TEMOVATE) 1.24 % Apply 1 application topically 2 (two) times daily. Apply as directed twice daily for up to 2 weeks, then space out to 1-2 x a week. Not for daily long term use. 30 g 0  . clorazepate (TRANXENE) 15 MG tablet Take 1 tablet (15 mg total) by mouth 3 (three) times daily as needed. for anxiety 270 tablet 1  . doxepin (SINEQUAN) 50 MG capsule Take 3 capsules (150 mg total) by mouth at bedtime. 270 capsule 3  . estradiol cypionate (DEPO-ESTRADIOL) 5 MG/ML injection Use 0.3 ml every 18-25 days IM 5 mL 3  . furosemide (LASIX) 40 MG tablet Take 1 tablet (40 mg total) by mouth 2 (two) times daily. 180 tablet 3  . levothyroxine (SYNTHROID, LEVOTHROID) 137 MCG tablet Take 1 tablet (137 mcg total) by mouth daily before breakfast. 30 tablet 11  . LORazepam (ATIVAN) 0.5 MG tablet Take 1 tablet (0.5 mg total) by mouth at bedtime. 90 tablet 1  . NIFEdipine (PROCARDIA-XL/NIFEDICAL-XL) 30 MG 24 hr tablet Take 1 tablet (30 mg total) by mouth daily. 90 tablet 3  . nitroGLYCERIN (NITROLINGUAL) 0.4 MG/SPRAY spray PLACE 1 SPRAY UNDER THE TONGUE EVERY 5 MINUTES AS NEEDED FOR CHEST PAIN. MAY REPEAT 3 TIMES 12 g 1  . potassium chloride SA (KLOR-CON M20) 20 MEQ tablet Take 1 tablet (20 mEq total) by mouth 2 (two) times daily. 180 tablet 3   Facility-Administered Medications Prior to Visit  Medication Dose Route Frequency Provider Last Rate Last Dose  . estradiol cypionate  (DEPO-ESTRADIOL) 5 MG/ML injection 1.5 mg  1.5 mg Intramuscular Q30 days , Evie Lacks, MD   1.5 mg at 11/24/18 0814    ROS: Review of Systems  Constitutional: Negative for activity change, appetite change, chills, fatigue and unexpected weight change.  HENT: Negative for congestion, mouth sores and sinus pressure.   Eyes: Negative for visual disturbance.  Respiratory: Negative for cough and chest tightness.   Gastrointestinal: Negative for abdominal pain and nausea.  Genitourinary: Negative for difficulty urinating, frequency and vaginal pain.  Musculoskeletal: Negative for back pain and gait problem.  Skin: Positive for wound. Negative for color change, pallor and rash.  Neurological: Negative for dizziness, tremors, weakness, numbness and headaches.  Psychiatric/Behavioral: Negative for sleep disturbance. The patient is nervous/anxious.     Objective:  There were no vitals taken for this visit.  BP Readings from Last 3 Encounters:  02/06/19 140/90  01/22/19 140/80  12/29/18 132/80    Wt Readings from Last 3 Encounters:  02/06/19 158 lb (71.7 kg)  01/22/19 158 lb (71.7 kg)  12/29/18 160 lb (72.6 kg)    Physical Exam Constitutional:      Appearance: Normal appearance. She is not ill-appearing or diaphoretic.  Skin:    Coloration: Skin is not pale.     Findings: Lesion present. No  erythema or rash.  Psychiatric:        Judgment: Judgment normal.   Right forearm wound is covered with fibrin and scabs.  There is no surrounding erythema or infiltration present.  I debrided wound with forceps and gauze.  The wound was irrigated with hydrogen peroxide.  Telfa pad with mupirocin ointment applied.  Ace wrap Total time spent with patient and wound care was greater than 20 minutes  Lab Results  Component Value Date   WBC 4.8 07/29/2018   HGB 12.4 07/29/2018   HCT 36.9 07/29/2018   PLT 239.0 07/29/2018   GLUCOSE 91 07/29/2018   CHOL 236 (H) 11/02/2016   TRIG 100.0  11/02/2016   HDL 60.60 11/02/2016   LDLDIRECT 188.2 01/10/2012   LDLCALC 155 (H) 11/02/2016   ALT 14 07/29/2018   AST 19 07/29/2018   NA 139 07/29/2018   K 3.8 07/29/2018   CL 103 07/29/2018   CREATININE 1.11 07/29/2018   BUN 14 07/29/2018   CO2 26 07/29/2018   TSH 0.15 (L) 07/17/2018   INR 1.0 08/01/2018    Ct Renal Stone Study  Result Date: 08/01/2018 CLINICAL DATA:  Hematuria, loose stools EXAM: CT ABDOMEN AND PELVIS WITHOUT CONTRAST TECHNIQUE: Multidetector CT imaging of the abdomen and pelvis was performed following the standard protocol without IV contrast. COMPARISON:  12/24/2014 FINDINGS: Lower chest: No acute abnormality. Coronary artery calcifications. Hepatobiliary: No focal liver abnormality is seen. No gallstones, gallbladder wall thickening, or biliary dilatation. Pancreas: Unremarkable. No pancreatic ductal dilatation or surrounding inflammatory changes. Spleen: Normal in size without focal abnormality. Adrenals/Urinary Tract: Adrenal glands are unremarkable. Kidneys are normal, without renal calculi, focal lesion, or hydronephrosis. Multiple small calculi in the dependent urinary bladder as seen on prior examination. Stomach/Bowel: Stomach is within normal limits. Appendix appears normal. No evidence of bowel wall thickening, distention, or inflammatory changes. Vascular/Lymphatic: Calcific atherosclerosis. No enlarged abdominal or pelvic lymph nodes. Reproductive: No mass or other abnormality. Status post hysterectomy. Other: No abdominal wall hernia or abnormality. No abdominopelvic ascites. Musculoskeletal: No acute or significant osseous findings. IMPRESSION: 1. No acute noncontrast CT findings of the abdomen or pelvis to explain hematuria or loose stools. 2. Multiple small calculi within the dependent urinary bladder, similar to prior study. Electronically Signed   By: Lauralyn Primes M.D.   On: 08/01/2018 15:42    Assessment & Plan:   There are no diagnoses linked to this  encounter.   No orders of the defined types were placed in this encounter.    Follow-up: No follow-ups on file.  Sonda Primes, MD

## 2019-02-15 DIAGNOSIS — W540XXA Bitten by dog, initial encounter: Secondary | ICD-10-CM | POA: Insufficient documentation

## 2019-02-15 DIAGNOSIS — S41159A Open bite of unspecified upper arm, initial encounter: Secondary | ICD-10-CM | POA: Insufficient documentation

## 2019-02-15 NOTE — Assessment & Plan Note (Signed)
Due to dog bite wound  The wound was cleaned/dressed.  Continue with Bactroban daily dressing change

## 2019-02-15 NOTE — Assessment & Plan Note (Signed)
The wound was cleaned/dressed.  Continue with Bactroban daily dressing change

## 2019-02-19 ENCOUNTER — Encounter: Payer: Self-pay | Admitting: Internal Medicine

## 2019-02-19 ENCOUNTER — Ambulatory Visit: Payer: Medicare Other | Admitting: Internal Medicine

## 2019-02-19 ENCOUNTER — Ambulatory Visit (INDEPENDENT_AMBULATORY_CARE_PROVIDER_SITE_OTHER): Payer: Medicare Other | Admitting: Internal Medicine

## 2019-02-19 ENCOUNTER — Other Ambulatory Visit: Payer: Self-pay

## 2019-02-19 DIAGNOSIS — S41151D Open bite of right upper arm, subsequent encounter: Secondary | ICD-10-CM | POA: Diagnosis not present

## 2019-02-19 DIAGNOSIS — Z79890 Hormone replacement therapy (postmenopausal): Secondary | ICD-10-CM

## 2019-02-19 DIAGNOSIS — W540XXD Bitten by dog, subsequent encounter: Secondary | ICD-10-CM | POA: Diagnosis not present

## 2019-02-19 NOTE — Assessment & Plan Note (Addendum)
Chronic HRT estradiol injections DVT prophylaxis discussed

## 2019-02-19 NOTE — Progress Notes (Signed)
Subjective:  Patient ID: [name], female    DOB: 11/25/1945  Age: 73 y.o. MRN: [id number]  CC: No chief complaint on file.   HPI [name] presents for being upset: sister is dying; brother w/cerv spine fx - out of town F/u dog bite F/u hot flashes - needs a shot  Outpatient Medications Prior to Visit  Medication Sig Dispense Refill  . aspirin 81 MG EC tablet Take 81 mg by mouth daily.      . Cholecalciferol 1000 UNITS tablet Take 1,000 Units by mouth daily.      . clobetasol ointment (TEMOVATE) 0.05 % Apply 1 application topically 2 (two) times daily. Apply as directed twice daily for up to 2 weeks, then space out to 1-2 x a week. Not for daily long term use. 30 g 0  . clorazepate (TRANXENE) 15 MG tablet Take 1 tablet (15 mg total) by mouth 3 (three) times daily as needed. for anxiety 270 tablet 1  . doxepin (SINEQUAN) 50 MG capsule Take 3 capsules (150 mg total) by mouth at bedtime. 270 capsule 3  . estradiol cypionate (DEPO-ESTRADIOL) 5 MG/ML injection Use 0.3 ml every 18-25 days IM 5 mL 3  . furosemide (LASIX) 40 MG tablet Take 1 tablet (40 mg total) by mouth 2 (two) times daily. 180 tablet 3  . levothyroxine (SYNTHROID, LEVOTHROID) 137 MCG tablet Take 1 tablet (137 mcg total) by mouth daily before breakfast. 30 tablet 11  . LORazepam (ATIVAN) 0.5 MG tablet Take 1 tablet (0.5 mg total) by mouth at bedtime. 90 tablet 1  . mupirocin ointment (BACTROBAN) 2 % On leg wound w/dressing change qd or bid 22 g 0  . NIFEdipine (PROCARDIA-XL/NIFEDICAL-XL) 30 MG 24 hr tablet Take 1 tablet (30 mg total) by mouth daily. 90 tablet 3  . nitroGLYCERIN (NITROLINGUAL) 0.4 MG/SPRAY spray PLACE 1 SPRAY UNDER THE TONGUE EVERY 5 MINUTES AS NEEDED FOR CHEST PAIN. MAY REPEAT 3 TIMES 12 g 1  . potassium chloride SA (KLOR-CON M20) 20 MEQ tablet Take 1 tablet (20 mEq total) by mouth 2 (two) times daily. 180 tablet 3   Facility-Administered Medications Prior to Visit  Medication Dose Route Frequency  Provider Last Rate Last Dose  . estradiol cypionate (DEPO-ESTRADIOL) 5 MG/ML injection 1.5 mg  1.5 mg Intramuscular Q30 days Plotnikov, Georgina Quint, MD   1.5 mg at 11/24/18 0814    ROS: Review of Systems  Constitutional: Negative for activity change, appetite change, chills, fatigue and unexpected weight change.  HENT: Negative for congestion, mouth sores and sinus pressure.   Eyes: Negative for visual disturbance.  Respiratory: Negative for cough and chest tightness.   Gastrointestinal: Negative for abdominal pain and nausea.  Genitourinary: Negative for difficulty urinating, frequency and vaginal pain.  Musculoskeletal: Negative for back pain and gait problem.  Skin: Positive for wound. Negative for color change, pallor and rash.  Neurological: Negative for dizziness, tremors, weakness, numbness and headaches.  Psychiatric/Behavioral: Negative for confusion and sleep disturbance.    Objective:  BP 130/78 (BP Location: Left Arm, Patient Position: Sitting, Cuff Size: Normal)   Pulse 85   Temp 98 F (36.7 C) (Oral)   Ht 5\' 8"  (1.727 m)   Wt 156 lb (70.8 kg)   SpO2 97%   BMI 23.72 kg/m   BP Readings from Last 3 Encounters:  02/19/19 130/78  02/12/19 (!) 148/82  02/06/19 140/90    Wt Readings from Last 3 Encounters:  02/19/19 156 lb (70.8 kg)  02/12/19  158 lb (71.7 kg)  02/06/19 158 lb (71.7 kg)    Physical Exam Constitutional:      General: She is not in acute distress.    Appearance: She is well-developed.  HENT:     Head: Normocephalic.     Right Ear: External ear normal.     Left Ear: External ear normal.     Nose: Nose normal.  Eyes:     General:        Right eye: No discharge.        Left eye: No discharge.     Conjunctiva/sclera: Conjunctivae normal.     Pupils: Pupils are equal, round, and reactive to light.  Neck:     Musculoskeletal: Normal range of motion and neck supple.     Thyroid: No thyromegaly.     Vascular: No JVD.     Trachea: No tracheal  deviation.  Cardiovascular:     Rate and Rhythm: Normal rate and regular rhythm.     Heart sounds: Normal heart sounds.  Pulmonary:     Effort: No respiratory distress.     Breath sounds: No stridor. No wheezing.  Abdominal:     General: Bowel sounds are normal. There is no distension.     Palpations: Abdomen is soft. There is no mass.     Tenderness: There is no abdominal tenderness. There is no guarding or rebound.  Musculoskeletal:        General: No tenderness.  Lymphadenopathy:     Cervical: No cervical adenopathy.  Skin:    Findings: Lesion present. No erythema or rash.  Neurological:     Mental Status: She is oriented to person, place, and time.     Cranial Nerves: No cranial nerve deficit.     Motor: No abnormal muscle tone.     Coordination: Coordination normal.     Deep Tendon Reflexes: Reflexes normal.  Psychiatric:        Behavior: Behavior normal.        Thought Content: Thought content normal.        Judgment: Judgment normal.   wound is healing  Lab Results  Component Value Date   WBC 4.8 07/29/2018   HGB 12.4 07/29/2018   HCT 36.9 07/29/2018   PLT 239.0 07/29/2018   GLUCOSE 91 07/29/2018   CHOL 236 (H) 11/02/2016   TRIG 100.0 11/02/2016   HDL 60.60 11/02/2016   LDLDIRECT 188.2 01/10/2012   LDLCALC 155 (H) 11/02/2016   ALT 14 07/29/2018   AST 19 07/29/2018   NA 139 07/29/2018   K 3.8 07/29/2018   CL 103 07/29/2018   CREATININE 1.11 07/29/2018   BUN 14 07/29/2018   CO2 26 07/29/2018   TSH 0.15 (L) 07/17/2018   INR 1.0 08/01/2018    Ct Renal Stone Study  Result Date: 08/01/2018 CLINICAL DATA:  Hematuria, loose stools EXAM: CT ABDOMEN AND PELVIS WITHOUT CONTRAST TECHNIQUE: Multidetector CT imaging of the abdomen and pelvis was performed following the standard protocol without IV contrast. COMPARISON:  12/24/2014 FINDINGS: Lower chest: No acute abnormality. Coronary artery calcifications. Hepatobiliary: No focal liver abnormality is seen. No  gallstones, gallbladder wall thickening, or biliary dilatation. Pancreas: Unremarkable. No pancreatic ductal dilatation or surrounding inflammatory changes. Spleen: Normal in size without focal abnormality. Adrenals/Urinary Tract: Adrenal glands are unremarkable. Kidneys are normal, without renal calculi, focal lesion, or hydronephrosis. Multiple small calculi in the dependent urinary bladder as seen on prior examination. Stomach/Bowel: Stomach is within normal limits. Appendix appears normal. No  evidence of bowel wall thickening, distention, or inflammatory changes. Vascular/Lymphatic: Calcific atherosclerosis. No enlarged abdominal or pelvic lymph nodes. Reproductive: No mass or other abnormality. Status post hysterectomy. Other: No abdominal wall hernia or abnormality. No abdominopelvic ascites. Musculoskeletal: No acute or significant osseous findings. IMPRESSION: 1. No acute noncontrast CT findings of the abdomen or pelvis to explain hematuria or loose stools. 2. Multiple small calculi within the dependent urinary bladder, similar to prior study. Electronically Signed   By: Lauralyn PrimesAlex  Bibbey M.D.   On: 08/01/2018 15:42    Assessment & Plan:   There are no diagnoses linked to this encounter.   No orders of the defined types were placed in this encounter.    Follow-up: No follow-ups on file.  Sonda PrimesAlex Plotnikov, MD

## 2019-02-19 NOTE — Assessment & Plan Note (Signed)
Healing. Cont w/dressing changes

## 2019-02-25 ENCOUNTER — Ambulatory Visit: Payer: Medicare Other | Admitting: Internal Medicine

## 2019-03-19 ENCOUNTER — Other Ambulatory Visit: Payer: Self-pay

## 2019-03-19 ENCOUNTER — Ambulatory Visit (INDEPENDENT_AMBULATORY_CARE_PROVIDER_SITE_OTHER): Payer: Medicare Other | Admitting: Internal Medicine

## 2019-03-19 ENCOUNTER — Encounter: Payer: Self-pay | Admitting: Internal Medicine

## 2019-03-19 DIAGNOSIS — I10 Essential (primary) hypertension: Secondary | ICD-10-CM | POA: Diagnosis not present

## 2019-03-19 DIAGNOSIS — N951 Menopausal and female climacteric states: Secondary | ICD-10-CM | POA: Diagnosis not present

## 2019-03-19 DIAGNOSIS — E278 Other specified disorders of adrenal gland: Secondary | ICD-10-CM | POA: Diagnosis not present

## 2019-03-19 IMAGING — DX DG FOOT COMPLETE 3+V*R*
3 series · 3 of 3 positions shown · non-contrast
Comparison: No recent prior .

CLINICAL DATA: Bruising.  No known injury.

EXAM:
RIGHT FOOT COMPLETE - 3+ VIEW

[foot ap]
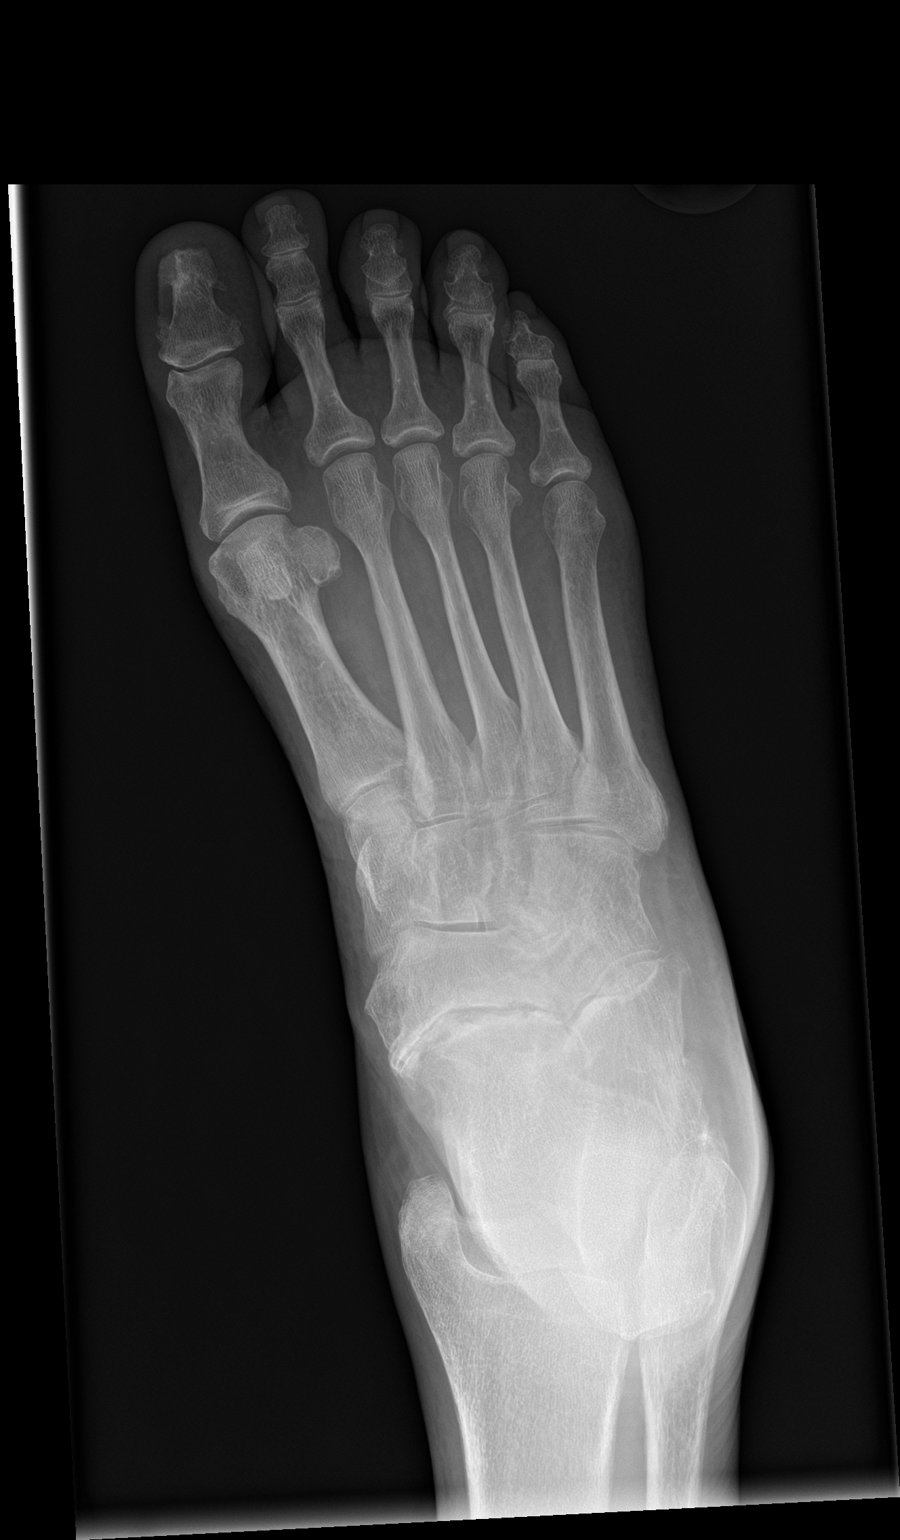

[foot obl]
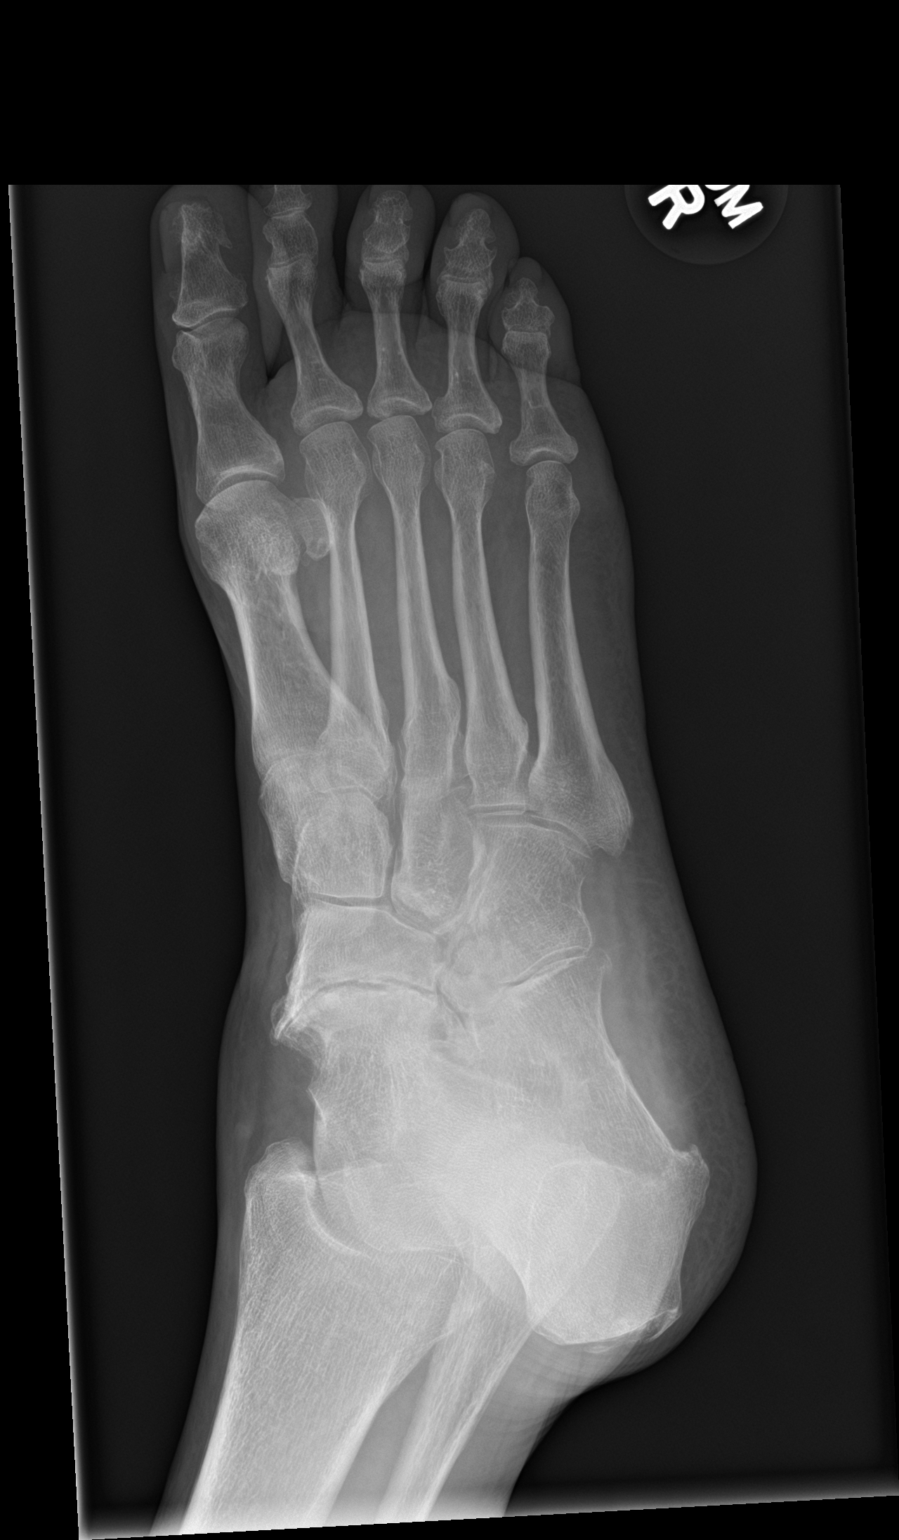

[foot lat]
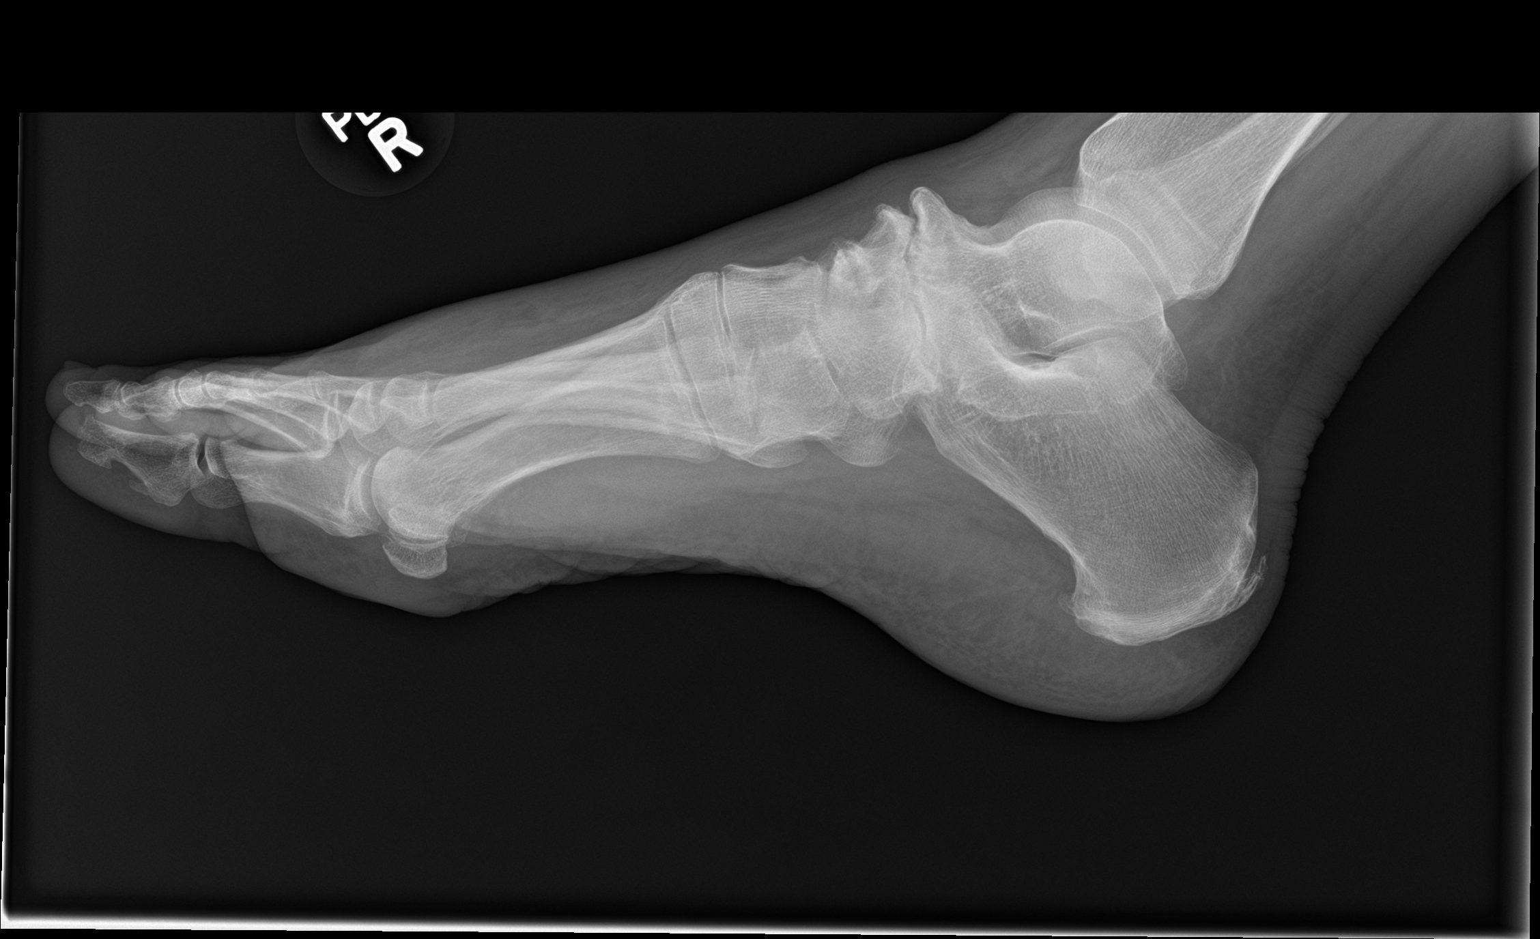

[3 of 3 positions shown; findings below may reference images not displayed]

FINDINGS: Diffuse degenerative change.  No acute or focal bony abnormality.
IMPRESSION: No acute or focal abnormality.  Diffuse degenerative change .

## 2019-03-19 NOTE — Progress Notes (Signed)
Subjective:  Patient ID: [name], female    DOB: [birth year]  Age: 73 y.o. MRN: [id number]  CC: No chief complaint on file.   HPI LYGIA OLAES presents for HTN, OA, anxiety f/u  Outpatient Medications Prior to Visit  Medication Sig Dispense Refill  . aspirin 81 MG EC tablet Take 81 mg by mouth daily.      . Cholecalciferol 1000 UNITS tablet Take 1,000 Units by mouth daily.      . clobetasol ointment (TEMOVATE) 0.05 % Apply 1 application topically 2 (two) times daily. Apply as directed twice daily for up to 2 weeks, then space out to 1-2 x a week. Not for daily long term use. 30 g 0  . clorazepate (TRANXENE) 15 MG tablet Take 1 tablet (15 mg total) by mouth 3 (three) times daily as needed. for anxiety 270 tablet 1  . doxepin (SINEQUAN) 50 MG capsule Take 3 capsules (150 mg total) by mouth at bedtime. 270 capsule 3  . estradiol cypionate (DEPO-ESTRADIOL) 5 MG/ML injection Use 0.3 ml every 18-25 days IM 5 mL 3  . furosemide (LASIX) 40 MG tablet Take 1 tablet (40 mg total) by mouth 2 (two) times daily. 180 tablet 3  . levothyroxine (SYNTHROID, LEVOTHROID) 137 MCG tablet Take 1 tablet (137 mcg total) by mouth daily before breakfast. 30 tablet 11  . LORazepam (ATIVAN) 0.5 MG tablet Take 1 tablet (0.5 mg total) by mouth at bedtime. 90 tablet 1  . mupirocin ointment (BACTROBAN) 2 % On leg wound w/dressing change qd or bid 22 g 0  . NIFEdipine (PROCARDIA-XL/NIFEDICAL-XL) 30 MG 24 hr tablet Take 1 tablet (30 mg total) by mouth daily. 90 tablet 3  . nitroGLYCERIN (NITROLINGUAL) 0.4 MG/SPRAY spray PLACE 1 SPRAY UNDER THE TONGUE EVERY 5 MINUTES AS NEEDED FOR CHEST PAIN. MAY REPEAT 3 TIMES 12 g 1  . potassium chloride SA (KLOR-CON M20) 20 MEQ tablet Take 1 tablet (20 mEq total) by mouth 2 (two) times daily. 180 tablet 3   Facility-Administered Medications Prior to Visit  Medication Dose Route Frequency Provider Last Rate Last Dose  . estradiol cypionate (DEPO-ESTRADIOL) 5 MG/ML injection 1.5 mg   1.5 mg Intramuscular Q30 days , Georgina Quint, MD   1.5 mg at 02/19/19 1632    ROS: Review of Systems  Constitutional: Positive for diaphoresis. Negative for activity change, appetite change, chills, fatigue and unexpected weight change.  HENT: Negative for congestion, mouth sores and sinus pressure.   Eyes: Negative for visual disturbance.  Respiratory: Negative for cough and chest tightness.   Gastrointestinal: Negative for abdominal pain and nausea.  Genitourinary: Negative for difficulty urinating, frequency and vaginal pain.  Musculoskeletal: Positive for arthralgias. Negative for back pain and gait problem.  Skin: Negative for pallor and rash.  Neurological: Negative for dizziness, tremors, weakness, numbness and headaches.  Psychiatric/Behavioral: Negative for confusion, sleep disturbance and suicidal ideas. The patient is nervous/anxious.     Objective:  Ht 5\' 8"  (1.727 m)   Wt 155 lb (70.3 kg)   BMI 23.57 kg/m   BP Readings from Last 3 Encounters:  02/19/19 130/78  02/12/19 (!) 148/82  02/06/19 140/90    Wt Readings from Last 3 Encounters:  03/19/19 155 lb (70.3 kg)  02/19/19 156 lb (70.8 kg)  02/12/19 158 lb (71.7 kg)    Physical Exam Constitutional:      General: She is not in acute distress.    Appearance: She is well-developed.  HENT:  Head: Normocephalic.     Right Ear: External ear normal.     Left Ear: External ear normal.     Nose: Nose normal.  Eyes:     General:        Right eye: No discharge.        Left eye: No discharge.     Conjunctiva/sclera: Conjunctivae normal.     Pupils: Pupils are equal, round, and reactive to light.  Neck:     Musculoskeletal: Normal range of motion and neck supple.     Thyroid: No thyromegaly.     Vascular: No JVD.     Trachea: No tracheal deviation.  Cardiovascular:     Rate and Rhythm: Normal rate and regular rhythm.     Heart sounds: Normal heart sounds.  Pulmonary:     Effort: No respiratory  distress.     Breath sounds: No stridor. No wheezing.  Abdominal:     General: Bowel sounds are normal. There is no distension.     Palpations: Abdomen is soft. There is no mass.     Tenderness: There is no abdominal tenderness. There is no guarding or rebound.  Musculoskeletal:        General: No tenderness.  Lymphadenopathy:     Cervical: No cervical adenopathy.  Skin:    Findings: No erythema or rash.  Neurological:     Mental Status: She is oriented to person, place, and time.     Cranial Nerves: No cranial nerve deficit.     Motor: No abnormal muscle tone.     Coordination: Coordination normal.     Deep Tendon Reflexes: Reflexes normal.  Psychiatric:        Behavior: Behavior normal.        Thought Content: Thought content normal.        Judgment: Judgment normal.     Lab Results  Component Value Date   WBC 4.8 07/29/2018   HGB 12.4 07/29/2018   HCT 36.9 07/29/2018   PLT 239.0 07/29/2018   GLUCOSE 91 07/29/2018   CHOL 236 (H) 11/02/2016   TRIG 100.0 11/02/2016   HDL 60.60 11/02/2016   LDLDIRECT 188.2 01/10/2012   LDLCALC 155 (H) 11/02/2016   ALT 14 07/29/2018   AST 19 07/29/2018   NA 139 07/29/2018   K 3.8 07/29/2018   CL 103 07/29/2018   CREATININE 1.11 07/29/2018   BUN 14 07/29/2018   CO2 26 07/29/2018   TSH 0.15 (L) 07/17/2018   INR 1.0 08/01/2018    Ct Renal Stone Study  Result Date: 08/01/2018 CLINICAL DATA:  Hematuria, loose stools EXAM: CT ABDOMEN AND PELVIS WITHOUT CONTRAST TECHNIQUE: Multidetector CT imaging of the abdomen and pelvis was performed following the standard protocol without IV contrast. COMPARISON:  12/24/2014 FINDINGS: Lower chest: No acute abnormality. Coronary artery calcifications. Hepatobiliary: No focal liver abnormality is seen. No gallstones, gallbladder wall thickening, or biliary dilatation. Pancreas: Unremarkable. No pancreatic ductal dilatation or surrounding inflammatory changes. Spleen: Normal in size without focal  abnormality. Adrenals/Urinary Tract: Adrenal glands are unremarkable. Kidneys are normal, without renal calculi, focal lesion, or hydronephrosis. Multiple small calculi in the dependent urinary bladder as seen on prior examination. Stomach/Bowel: Stomach is within normal limits. Appendix appears normal. No evidence of bowel wall thickening, distention, or inflammatory changes. Vascular/Lymphatic: Calcific atherosclerosis. No enlarged abdominal or pelvic lymph nodes. Reproductive: No mass or other abnormality. Status post hysterectomy. Other: No abdominal wall hernia or abnormality. No abdominopelvic ascites. Musculoskeletal: No acute or significant osseous findings.  IMPRESSION: 1. No acute noncontrast CT findings of the abdomen or pelvis to explain hematuria or loose stools. 2. Multiple small calculi within the dependent urinary bladder, similar to prior study. Electronically Signed   By: Eddie Candle M.D.   On: 08/01/2018 15:42    Assessment & Plan:   There are no diagnoses linked to this encounter.   No orders of the defined types were placed in this encounter.    Follow-up: No follow-ups on file.  Walker Kehr, MD

## 2019-03-19 NOTE — Assessment & Plan Note (Signed)
Depo-Estradiol 0.3 ml  Potential benefits of a long term HRT  use as well as potential risks  and complications were explained to the patient and were aknowledged. 

## 2019-03-19 NOTE — Assessment & Plan Note (Signed)
Toprol, Nifedipine 

## 2019-03-19 NOTE — Assessment & Plan Note (Signed)
CT 08/01/18 ok

## 2019-03-26 ENCOUNTER — Encounter: Payer: Self-pay | Admitting: Cardiovascular Disease

## 2019-03-26 ENCOUNTER — Other Ambulatory Visit: Payer: Self-pay

## 2019-03-26 ENCOUNTER — Ambulatory Visit: Payer: Medicare Other | Admitting: Cardiovascular Disease

## 2019-03-26 VITALS — BP 140/80 | HR 71 | Ht 68.0 in | Wt 154.8 lb

## 2019-03-26 DIAGNOSIS — I25119 Atherosclerotic heart disease of native coronary artery with unspecified angina pectoris: Secondary | ICD-10-CM | POA: Diagnosis not present

## 2019-03-26 DIAGNOSIS — I10 Essential (primary) hypertension: Secondary | ICD-10-CM

## 2019-03-26 DIAGNOSIS — T466X5A Adverse effect of antihyperlipidemic and antiarteriosclerotic drugs, initial encounter: Secondary | ICD-10-CM

## 2019-03-26 DIAGNOSIS — G720 Drug-induced myopathy: Secondary | ICD-10-CM

## 2019-03-26 NOTE — Patient Instructions (Signed)
Medication Instructions:  Your provider recommends that you continue on your current medications as directed. Please refer to the Current Medication list given to you today.   *If you need a refill on your cardiac medications before your next appointment, please call your pharmacy*  Follow-Up: At [facility], you and your health needs are our priority.  As part of our continuing mission to provide you with exceptional heart care, we have created designated Provider Care Teams.  These Care Teams include your primary Cardiologist (physician) and Advanced Practice Providers (APPs -  Physician Assistants and Nurse Practitioners) who all work together to provide you with the care you need, when you need it. Your next appointment:   12 month(s) The format for your next appointment:   In Person Provider:   You may see Dr. Cooper or one of the following Advanced Practice Providers on your designated Care Team:    Scott Weaver, PA-C  Vin Bhagat, PA-C  Janine Hammond, NP  

## 2019-03-26 NOTE — Progress Notes (Signed)
Cardiology Office Note:    Date:  03/26/2019   ID:  [name] StallRuby C [name], DOB 11/22/1945, MRN [id number]  PCP:  Tresa GarterPlotnikov, Aleksei V, MD  Cardiologist:  No primary care provider on file.  Electrophysiologist:  None   Referring MD: Tresa GarterPlotnikov, Aleksei V, MD   Chief Complaint  Patient presents with  . Coronary Artery Disease    History of Present Illness:    [name] is a 73 y.o. female with a hx of coronary artery disease.  Other cardiovascular problems include hypertension and hyperlipidemia.  The patient underwent remote PCI of the LAD, with last catheterization in 2011 demonstrating patency of her stent site and no other significant coronary blockages.  She is been intolerant to all lipid-lowering therapies.  She has a long history of whitecoat hypertension.  She is here alone today. she's been pretty isolated during the Covid-19 pandemic. She has a 402 1/75  Year-old poodle who keeps her company. Today, she denies symptoms of palpitations, chest pain, shortness of breath, orthopnea, PND, lower extremity edema, dizziness, or syncope.   Past Medical History:  Diagnosis Date  . Anxiety state, unspecified   . Bronchitis, acute   . Carotid bruit   . Coronary atherosclerosis of unspecified type of vessel, native or graft    multiple angioplasy procedures in 90's/early 2000's  . De Quervain's tenosynovitis   . Depressive disorder, not elsewhere classified   . Female bladder prolapse   . Lichen 2011   Vaginal  . Other and unspecified hyperlipidemia   . Unspecified essential hypertension   . Unspecified hypothyroidism     Past Surgical History:  Procedure Laterality Date  . MOUTH SURGERY    . NOSE SURGERY  1999  . TOTAL VAGINAL HYSTERECTOMY  1994    Current Medications: Current Meds  Medication Sig  . aspirin 81 MG EC tablet Take 81 mg by mouth daily.    . Cholecalciferol 1000 UNITS tablet Take 1,000 Units by mouth daily.    . clobetasol ointment (TEMOVATE) 0.05 % Apply 1  application topically 2 (two) times daily. Apply as directed twice daily for up to 2 weeks, then space out to 1-2 x a week. Not for daily long term use.  . clorazepate (TRANXENE) 15 MG tablet Take 1 tablet (15 mg total) by mouth 3 (three) times daily as needed. for anxiety  . doxepin (SINEQUAN) 50 MG capsule Take 3 capsules (150 mg total) by mouth at bedtime.  Marland Kitchen. estradiol cypionate (DEPO-ESTRADIOL) 5 MG/ML injection Use 0.3 ml every 18-25 days IM  . furosemide (LASIX) 40 MG tablet Take 1 tablet (40 mg total) by mouth 2 (two) times daily.  Marland Kitchen. levothyroxine (SYNTHROID, LEVOTHROID) 137 MCG tablet Take 1 tablet (137 mcg total) by mouth daily before breakfast.  . LORazepam (ATIVAN) 0.5 MG tablet Take 1 tablet (0.5 mg total) by mouth at bedtime.  . mupirocin ointment (BACTROBAN) 2 % On leg wound w/dressing change qd or bid  . NIFEdipine (PROCARDIA-XL/NIFEDICAL-XL) 30 MG 24 hr tablet Take 1 tablet (30 mg total) by mouth daily.  . nitroGLYCERIN (NITROLINGUAL) 0.4 MG/SPRAY spray PLACE 1 SPRAY UNDER THE TONGUE EVERY 5 MINUTES AS NEEDED FOR CHEST PAIN. MAY REPEAT 3 TIMES  . potassium chloride SA (KLOR-CON M20) 20 MEQ tablet Take 1 tablet (20 mEq total) by mouth 2 (two) times daily.   Current Facility-Administered Medications for the 03/26/19 encounter (Office Visit) with Tonny Bollmanooper, , MD  Medication  . estradiol cypionate (DEPO-ESTRADIOL) 5 MG/ML injection 1.5 mg  Allergies:   Epinephrine, Isosorbide mononitrate, Lidocaine, Morphine, Propoxyphene n-acetaminophen, Alprazolam, Atorvastatin, Codeine, Ezetimibe, Prilosec [omeprazole], Rosuvastatin, Simvastatin, Allopurinol, Meloxicam, Tetracycline, Triple antibiotic [bacitracin-neomycin-polymyxin], Amoxicillin, Ciprofloxacin, and Diphenhydramine hcl   Social History   Socioeconomic History  . Marital status: Widowed    Spouse name: Not on file  . Number of children: Not on file  . Years of education: Not on file  . Highest education level: Not on file   Occupational History  . Not on file  Social Needs  . Financial resource strain: Not on file  . Food insecurity    Worry: Not on file    Inability: Not on file  . Transportation needs    Medical: Not on file    Non-medical: Not on file  Tobacco Use  . Smoking status: Former Smoker    Quit date: 07/22/1991    Years since quitting: 27.6  . Smokeless tobacco: Never Used  Substance and Sexual Activity  . Alcohol use: No  . Drug use: No  . Sexual activity: Not Currently    Birth control/protection: Surgical    Comment: partial hysterectomy  Lifestyle  . Physical activity    Days per week: Not on file    Minutes per session: Not on file  . Stress: Not on file  Relationships  . Social Herbalist on phone: Not on file    Gets together: Not on file    Attends religious service: Not on file    Active member of club or organization: Not on file    Attends meetings of clubs or organizations: Not on file    Relationship status: Not on file  Other Topics Concern  . 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  ROS:   Please see the history of present illness.    All other systems reviewed and are negative.  EKGs/Labs/Other Studies Reviewed:    EKG:  EKG is ordered today.  The ekg ordered today demonstrates NSR 71 bpm with sinus arrhythmia, otherwise normal  Recent Labs: 04/21/2018: Pro B Natriuretic peptide (BNP) 63.0 07/17/2018: TSH 0.15 07/29/2018: ALT 14; BUN 14; Creatinine, Ser 1.11; Hemoglobin 12.4; Platelets 239.0; Potassium 3.8; Sodium 139  Recent Lipid Panel    Component Value Date/Time   CHOL 236 (H) 11/02/2016 1229   TRIG 100.0 11/02/2016 1229   HDL 60.60 11/02/2016 1229   CHOLHDL 4 11/02/2016 1229   VLDL 20.0  11/02/2016 1229   LDLCALC 155 (H) 11/02/2016 1229   LDLDIRECT 188.2 01/10/2012 1135    Physical Exam:    VS:  BP 140/80   Pulse 71   Ht 5\' 8"  (1.727 m)   Wt 154 lb 12.8 oz (70.2 kg)   SpO2 97%   BMI 23.54 kg/m     Wt Readings from Last 3 Encounters:  03/26/19 154 lb 12.8 oz (70.2 kg)  03/19/19 155 lb (70.3 kg)  02/19/19 156 lb (70.8 kg)     GEN:  Well nourished, well developed in no acute distress HEENT: Normal NECK: No JVD; No carotid bruits LYMPHATICS: No lymphadenopathy CARDIAC: RRR, no murmurs, rubs, gallops RESPIRATORY:  Clear to auscultation without rales, wheezing or rhonchi  ABDOMEN: Soft, non-tender, non-distended MUSCULOSKELETAL:  No edema; No deformity  SKIN:  Warm and dry NEUROLOGIC:  Alert and oriented x 3 PSYCHIATRIC:  Normal affect   ASSESSMENT:    1. Coronary artery disease involving native coronary artery of native heart with angina pectoris (HCC)   2. Essential hypertension    PLAN:    In order of problems listed above:  1. Doing well with no angina. Continue same Rx with ASA. Unable to tolerate lipid lowering Rx has been unable statin drugs, ezetimibe. 2. Hx white coat HTN. BP controlled. Continue same Rx (nifedipine).   Medication Adjustments/Labs and Tests Ordered: Current medicines are reviewed at length with the patient today.  Concerns regarding medicines are outlined above.  Orders Placed This Encounter  Procedures  . EKG 12-Lead   No orders of the defined types were placed in this encounter.   Patient Instructions  Medication Instructions:  Your provider recommends that you continue on your current medications as directed. Please refer to the Current Medication list given to you today.   *If you need a refill on your cardiac medications before your next appointment, please call your pharmacy*   Follow-Up: At [facility], you and your health needs are our priority.  As part of our continuing mission to provide you with  exceptional heart care, we have created designated Provider Care Teams.  These Care Teams include your primary Cardiologist (physician) and Advanced Practice Providers (APPs -  Physician Assistants and Nurse Practitioners) who all work together to provide you with the care you need, when you need it.  Your next appointment:   12 months  The format for your next appointment:   In Person  Provider:   You may see Dr. Excell Seltzer or one of the following Advanced Practice Providers on your designated Care Team:    Tereso Newcomer, PA-C  Vin [place], PA-C  [place], [place]    Signed, Tonny Bollman, MD  03/26/2019 5:04 PM    [facility]

## 2019-04-15 ENCOUNTER — Encounter: Payer: Self-pay | Admitting: Internal Medicine

## 2019-04-15 ENCOUNTER — Ambulatory Visit (INDEPENDENT_AMBULATORY_CARE_PROVIDER_SITE_OTHER): Payer: Medicare Other | Admitting: Internal Medicine

## 2019-04-15 ENCOUNTER — Other Ambulatory Visit: Payer: Self-pay

## 2019-04-15 DIAGNOSIS — M10072 Idiopathic gout, left ankle and foot: Secondary | ICD-10-CM | POA: Diagnosis not present

## 2019-04-15 DIAGNOSIS — N951 Menopausal and female climacteric states: Secondary | ICD-10-CM | POA: Diagnosis not present

## 2019-04-15 MED ORDER — METHYLPREDNISOLONE 4 MG PO TBPK: ORAL_TABLET | ORAL | 0 refills | Status: DC

## 2019-04-15 MED ORDER — METHYLPREDNISOLONE ACETATE 80 MG/ML IJ SUSP
80.0000 mg | Freq: Once | INTRAMUSCULAR | Status: AC
  Administered 2019-04-15: 80 mg via INTRAMUSCULAR

## 2019-04-15 NOTE — Progress Notes (Signed)
Subjective:  Patient ID: [name], female    DOB: 09-14-1945  Age: 73 y.o. MRN: [id number]  CC: No chief complaint on file.   HPI [name] presents for L foot and ankle pain since Mon - ?gout  Outpatient Medications Prior to Visit  Medication Sig Dispense Refill  . aspirin 81 MG EC tablet Take 81 mg by mouth daily.      . Cholecalciferol 1000 UNITS tablet Take 1,000 Units by mouth daily.      . clobetasol ointment (TEMOVATE) 0.05 % Apply 1 application topically 2 (two) times daily. Apply as directed twice daily for up to 2 weeks, then space out to 1-2 x a week. Not for daily long term use. 30 g 0  . clorazepate (TRANXENE) 15 MG tablet Take 1 tablet (15 mg total) by mouth 3 (three) times daily as needed. for anxiety 270 tablet 1  . doxepin (SINEQUAN) 50 MG capsule Take 3 capsules (150 mg total) by mouth at bedtime. 270 capsule 3  . estradiol cypionate (DEPO-ESTRADIOL) 5 MG/ML injection Use 0.3 ml every 18-25 days IM 5 mL 3  . furosemide (LASIX) 40 MG tablet Take 1 tablet (40 mg total) by mouth 2 (two) times daily. 180 tablet 3  . levothyroxine (SYNTHROID, LEVOTHROID) 137 MCG tablet Take 1 tablet (137 mcg total) by mouth daily before breakfast. 30 tablet 11  . LORazepam (ATIVAN) 0.5 MG tablet Take 1 tablet (0.5 mg total) by mouth at bedtime. 90 tablet 1  . mupirocin ointment (BACTROBAN) 2 % On leg wound w/dressing change qd or bid 22 g 0  . NIFEdipine (PROCARDIA-XL/NIFEDICAL-XL) 30 MG 24 hr tablet Take 1 tablet (30 mg total) by mouth daily. 90 tablet 3  . nitroGLYCERIN (NITROLINGUAL) 0.4 MG/SPRAY spray PLACE 1 SPRAY UNDER THE TONGUE EVERY 5 MINUTES AS NEEDED FOR CHEST PAIN. MAY REPEAT 3 TIMES 12 g 1  . potassium chloride SA (KLOR-CON M20) 20 MEQ tablet Take 1 tablet (20 mEq total) by mouth 2 (two) times daily. 180 tablet 3   Facility-Administered Medications Prior to Visit  Medication Dose Route Frequency Provider Last Rate Last Dose  . estradiol cypionate (DEPO-ESTRADIOL) 5 MG/ML  injection 1.5 mg  1.5 mg Intramuscular Q30 days Plotnikov, Georgina Quint, MD   1.5 mg at 04/15/19 1608    ROS: Review of Systems  Constitutional: Negative for activity change, appetite change, chills, fatigue and unexpected weight change.  HENT: Negative for congestion, mouth sores and sinus pressure.   Eyes: Negative for visual disturbance.  Respiratory: Negative for cough and chest tightness.   Gastrointestinal: Negative for abdominal pain and nausea.  Genitourinary: Negative for difficulty urinating, frequency and vaginal pain.  Musculoskeletal: Positive for arthralgias and gait problem. Negative for back pain.  Skin: Negative for pallor and rash.  Neurological: Negative for dizziness, tremors, weakness, numbness and headaches.  Psychiatric/Behavioral: Negative for confusion and sleep disturbance.    Objective:  BP (!) 142/86 (BP Location: Left Arm, Patient Position: Sitting, Cuff Size: Normal)   Pulse 94   Temp 98 F (36.7 C) (Oral)   Ht 5\' 8"  (1.727 m)   Wt 154 lb (69.9 kg)   SpO2 96%   BMI 23.42 kg/m   BP Readings from Last 3 Encounters:  04/15/19 (!) 142/86  03/26/19 140/80  03/19/19 126/84    Wt Readings from Last 3 Encounters:  04/15/19 154 lb (69.9 kg)  03/26/19 154 lb 12.8 oz (70.2 kg)  03/19/19 155 lb (70.3 kg)  Physical Exam Constitutional:      General: She is not in acute distress.    Appearance: She is well-developed.  HENT:     Head: Normocephalic.     Right Ear: External ear normal.     Left Ear: External ear normal.     Nose: Nose normal.  Eyes:     General:        Right eye: No discharge.        Left eye: No discharge.     Conjunctiva/sclera: Conjunctivae normal.     Pupils: Pupils are equal, round, and reactive to light.  Neck:     Musculoskeletal: Normal range of motion and neck supple.     Thyroid: No thyromegaly.     Vascular: No JVD.     Trachea: No tracheal deviation.  Cardiovascular:     Rate and Rhythm: Normal rate and regular  rhythm.     Heart sounds: Normal heart sounds.  Pulmonary:     Effort: No respiratory distress.     Breath sounds: No stridor. No wheezing.  Abdominal:     General: Bowel sounds are normal. There is no distension.     Palpations: Abdomen is soft. There is no mass.     Tenderness: There is no abdominal tenderness. There is no guarding or rebound.  Musculoskeletal:        General: Swelling and tenderness present.     Right lower leg: No edema.  Lymphadenopathy:     Cervical: No cervical adenopathy.  Skin:    Findings: No erythema or rash.  Neurological:     Cranial Nerves: No cranial nerve deficit.     Motor: No abnormal muscle tone.     Coordination: Coordination normal.     Deep Tendon Reflexes: Reflexes normal.  Psychiatric:        Behavior: Behavior normal.        Thought Content: Thought content normal.        Judgment: Judgment normal.    R foot w/painful swelling  Lab Results  Component Value Date   WBC 4.8 07/29/2018   HGB 12.4 07/29/2018   HCT 36.9 07/29/2018   PLT 239.0 07/29/2018   GLUCOSE 91 07/29/2018   CHOL 236 (H) 11/02/2016   TRIG 100.0 11/02/2016   HDL 60.60 11/02/2016   LDLDIRECT 188.2 01/10/2012   LDLCALC 155 (H) 11/02/2016   ALT 14 07/29/2018   AST 19 07/29/2018   NA 139 07/29/2018   K 3.8 07/29/2018   CL 103 07/29/2018   CREATININE 1.11 07/29/2018   BUN 14 07/29/2018   CO2 26 07/29/2018   TSH 0.15 (L) 07/17/2018   INR 1.0 08/01/2018    Ct Renal Stone Study  Result Date: 08/01/2018 CLINICAL DATA:  Hematuria, loose stools EXAM: CT ABDOMEN AND PELVIS WITHOUT CONTRAST TECHNIQUE: Multidetector CT imaging of the abdomen and pelvis was performed following the standard protocol without IV contrast. COMPARISON:  12/24/2014 FINDINGS: Lower chest: No acute abnormality. Coronary artery calcifications. Hepatobiliary: No focal liver abnormality is seen. No gallstones, gallbladder wall thickening, or biliary dilatation. Pancreas: Unremarkable. No pancreatic  ductal dilatation or surrounding inflammatory changes. Spleen: Normal in size without focal abnormality. Adrenals/Urinary Tract: Adrenal glands are unremarkable. Kidneys are normal, without renal calculi, focal lesion, or hydronephrosis. Multiple small calculi in the dependent urinary bladder as seen on prior examination. Stomach/Bowel: Stomach is within normal limits. Appendix appears normal. No evidence of bowel wall thickening, distention, or inflammatory changes. Vascular/Lymphatic: Calcific atherosclerosis. No enlarged abdominal or  pelvic lymph nodes. Reproductive: No mass or other abnormality. Status post hysterectomy. Other: No abdominal wall hernia or abnormality. No abdominopelvic ascites. Musculoskeletal: No acute or significant osseous findings. IMPRESSION: 1. No acute noncontrast CT findings of the abdomen or pelvis to explain hematuria or loose stools. 2. Multiple small calculi within the dependent urinary bladder, similar to prior study. Electronically Signed   By: Lauralyn PrimesAlex  Bibbey M.D.   On: 08/01/2018 15:42    Assessment & Plan:   There are no diagnoses linked to this encounter.   No orders of the defined types were placed in this encounter.    Follow-up: No follow-ups on file.  Sonda PrimesAlex Plotnikov, MD

## 2019-04-15 NOTE — Assessment & Plan Note (Addendum)
R foot Xray if not better Medrol pac Depo-medrol

## 2019-04-18 ENCOUNTER — Encounter: Payer: Self-pay | Admitting: Internal Medicine

## 2019-04-18 DIAGNOSIS — G720 Drug-induced myopathy: Secondary | ICD-10-CM | POA: Insufficient documentation

## 2019-04-18 DIAGNOSIS — T466X5A Adverse effect of antihyperlipidemic and antiarteriosclerotic drugs, initial encounter: Secondary | ICD-10-CM | POA: Insufficient documentation

## 2019-04-20 ENCOUNTER — Encounter: Payer: Self-pay | Admitting: Internal Medicine

## 2019-04-20 ENCOUNTER — Ambulatory Visit: Payer: Medicare Other | Admitting: Internal Medicine

## 2019-04-21 DIAGNOSIS — H43393 Other vitreous opacities, bilateral: Secondary | ICD-10-CM | POA: Diagnosis not present

## 2019-05-08 DIAGNOSIS — H43393 Other vitreous opacities, bilateral: Secondary | ICD-10-CM | POA: Diagnosis not present

## 2019-05-12 ENCOUNTER — Other Ambulatory Visit: Payer: Self-pay

## 2019-05-12 ENCOUNTER — Ambulatory Visit (INDEPENDENT_AMBULATORY_CARE_PROVIDER_SITE_OTHER): Payer: Medicare Other | Admitting: Internal Medicine

## 2019-05-12 ENCOUNTER — Encounter: Payer: Self-pay | Admitting: Internal Medicine

## 2019-05-12 DIAGNOSIS — E034 Atrophy of thyroid (acquired): Secondary | ICD-10-CM | POA: Diagnosis not present

## 2019-05-12 DIAGNOSIS — N951 Menopausal and female climacteric states: Secondary | ICD-10-CM

## 2019-05-12 DIAGNOSIS — H43393 Other vitreous opacities, bilateral: Secondary | ICD-10-CM | POA: Diagnosis not present

## 2019-05-12 DIAGNOSIS — M10072 Idiopathic gout, left ankle and foot: Secondary | ICD-10-CM

## 2019-05-12 DIAGNOSIS — I10 Essential (primary) hypertension: Secondary | ICD-10-CM

## 2019-05-12 MED ORDER — NITROGLYCERIN 0.4 MG/SPRAY TL SOLN: 1 refills | Status: DC

## 2019-05-12 MED ORDER — NIFEDIPINE ER OSMOTIC RELEASE 30 MG PO TB24: 30.0000 mg | ORAL_TABLET | Freq: Every day | ORAL | 3 refills | Status: DC

## 2019-05-12 MED ORDER — FUROSEMIDE 40 MG PO TABS: 40.0000 mg | ORAL_TABLET | Freq: Two times a day (BID) | ORAL | 3 refills | Status: DC

## 2019-05-12 MED ORDER — POTASSIUM CHLORIDE CRYS ER 20 MEQ PO TBCR: 20.0000 meq | EXTENDED_RELEASE_TABLET | Freq: Two times a day (BID) | ORAL | 3 refills | Status: DC

## 2019-05-12 MED ORDER — CLORAZEPATE DIPOTASSIUM 15 MG PO TABS: 15.0000 mg | ORAL_TABLET | Freq: Three times a day (TID) | ORAL | 1 refills | Status: DC | PRN

## 2019-05-12 MED ORDER — LEVOTHYROXINE SODIUM 137 MCG PO TABS: 137.0000 ug | ORAL_TABLET | Freq: Every day | ORAL | 11 refills | Status: DC

## 2019-05-12 MED ORDER — DEPO-ESTRADIOL 5 MG/ML IM OIL: TOPICAL_OIL | INTRAMUSCULAR | 3 refills | Status: DC

## 2019-05-12 MED ORDER — LORAZEPAM 0.5 MG PO TABS: 0.5000 mg | ORAL_TABLET | Freq: Every day | ORAL | 1 refills | Status: DC

## 2019-05-12 MED ORDER — DOXEPIN HCL 50 MG PO CAPS: 150.0000 mg | ORAL_CAPSULE | Freq: Every day | ORAL | 3 refills | Status: DC

## 2019-05-12 NOTE — Assessment & Plan Note (Signed)
Levothroid 

## 2019-05-12 NOTE — Progress Notes (Signed)
Subjective:  Patient ID: [name], female    DOB: 1946/05/16  Age: 73 y.o. MRN: [id number]  CC: No chief complaint on file.   HPI [name] presents for HTN, hypothyroidism, menopausal sweats f/u Just had a R eye YAG laser procedure  Outpatient Medications Prior to Visit  Medication Sig Dispense Refill  . aspirin 81 MG EC tablet Take 81 mg by mouth daily.      . Cholecalciferol 1000 UNITS tablet Take 1,000 Units by mouth daily.      . clobetasol ointment (TEMOVATE) 0.05 % Apply 1 application topically 2 (two) times daily. Apply as directed twice daily for up to 2 weeks, then space out to 1-2 x a week. Not for daily long term use. 30 g 0  . clorazepate (TRANXENE) 15 MG tablet Take 1 tablet (15 mg total) by mouth 3 (three) times daily as needed. for anxiety 270 tablet 1  . doxepin (SINEQUAN) 50 MG capsule Take 3 capsules (150 mg total) by mouth at bedtime. 270 capsule 3  . estradiol cypionate (DEPO-ESTRADIOL) 5 MG/ML injection Use 0.3 ml every 18-25 days IM 5 mL 3  . furosemide (LASIX) 40 MG tablet Take 1 tablet (40 mg total) by mouth 2 (two) times daily. 180 tablet 3  . levothyroxine (SYNTHROID, LEVOTHROID) 137 MCG tablet Take 1 tablet (137 mcg total) by mouth daily before breakfast. 30 tablet 11  . LORazepam (ATIVAN) 0.5 MG tablet Take 1 tablet (0.5 mg total) by mouth at bedtime. 90 tablet 1  . methylPREDNISolone (MEDROL DOSEPAK) 4 MG TBPK tablet As directed 21 tablet 0  . mupirocin ointment (BACTROBAN) 2 % On leg wound w/dressing change qd or bid 22 g 0  . NIFEdipine (PROCARDIA-XL/NIFEDICAL-XL) 30 MG 24 hr tablet Take 1 tablet (30 mg total) by mouth daily. 90 tablet 3  . nitroGLYCERIN (NITROLINGUAL) 0.4 MG/SPRAY spray PLACE 1 SPRAY UNDER THE TONGUE EVERY 5 MINUTES AS NEEDED FOR CHEST PAIN. MAY REPEAT 3 TIMES 12 g 1  . potassium chloride SA (KLOR-CON M20) 20 MEQ tablet Take 1 tablet (20 mEq total) by mouth 2 (two) times daily. 180 tablet 3   Facility-Administered Medications Prior  to Visit  Medication Dose Route Frequency Provider Last Rate Last Admin  . estradiol cypionate (DEPO-ESTRADIOL) 5 MG/ML injection 1.5 mg  1.5 mg Intramuscular Q30 days Nettie Cromwell, Georgina Quint, MD   1.5 mg at 05/12/19 1628    ROS: Review of Systems  Constitutional: Positive for diaphoresis. Negative for activity change, appetite change, chills, fatigue and unexpected weight change.  HENT: Negative for congestion, mouth sores and sinus pressure.   Eyes: Negative for visual disturbance.  Respiratory: Negative for cough and chest tightness.   Gastrointestinal: Negative for abdominal pain and nausea.  Genitourinary: Negative for difficulty urinating, frequency and vaginal pain.  Musculoskeletal: Positive for arthralgias. Negative for back pain and gait problem.  Skin: Negative for pallor and rash.  Neurological: Negative for dizziness, tremors, weakness, numbness and headaches.  Psychiatric/Behavioral: Negative for confusion, sleep disturbance and suicidal ideas.    Objective:  BP (!) 172/76 (BP Location: Left Arm, Patient Position: Sitting, Cuff Size: Normal)   Pulse 61   Temp 98 F (36.7 C) (Oral)   Ht 5\' 8"  (1.727 m)   Wt 152 lb (68.9 kg)   SpO2 99%   BMI 23.11 kg/m   BP Readings from Last 3 Encounters:  05/12/19 (!) 172/76  04/15/19 (!) 142/86  03/26/19 140/80    Wt Readings from Last 3  Encounters:  05/12/19 152 lb (68.9 kg)  04/15/19 154 lb (69.9 kg)  03/26/19 154 lb 12.8 oz (70.2 kg)    Physical Exam Constitutional:      General: She is not in acute distress.    Appearance: She is well-developed.  HENT:     Head: Normocephalic.     Right Ear: External ear normal.     Left Ear: External ear normal.     Nose: Nose normal.  Eyes:     General:        Right eye: No discharge.        Left eye: No discharge.     Conjunctiva/sclera: Conjunctivae normal.     Pupils: Pupils are equal, round, and reactive to light.  Neck:     Thyroid: No thyromegaly.     Vascular: No  JVD.     Trachea: No tracheal deviation.  Cardiovascular:     Rate and Rhythm: Normal rate and regular rhythm.     Heart sounds: Normal heart sounds.  Pulmonary:     Effort: No respiratory distress.     Breath sounds: No stridor. No wheezing.  Abdominal:     General: Bowel sounds are normal. There is no distension.     Palpations: Abdomen is soft. There is no mass.     Tenderness: There is no abdominal tenderness. There is no guarding or rebound.  Musculoskeletal:        General: No tenderness.     Cervical back: Normal range of motion and neck supple.  Lymphadenopathy:     Cervical: No cervical adenopathy.  Skin:    Findings: No erythema or rash.  Neurological:     Mental Status: She is oriented to person, place, and time.     Cranial Nerves: No cranial nerve deficit.     Motor: No abnormal muscle tone.     Coordination: Coordination normal.     Deep Tendon Reflexes: Reflexes normal.  Psychiatric:        Behavior: Behavior normal.        Thought Content: Thought content normal.        Judgment: Judgment normal.     Lab Results  Component Value Date   WBC 4.8 07/29/2018   HGB 12.4 07/29/2018   HCT 36.9 07/29/2018   PLT 239.0 07/29/2018   GLUCOSE 91 07/29/2018   CHOL 236 (H) 11/02/2016   TRIG 100.0 11/02/2016   HDL 60.60 11/02/2016   LDLDIRECT 188.2 01/10/2012   LDLCALC 155 (H) 11/02/2016   ALT 14 07/29/2018   AST 19 07/29/2018   NA 139 07/29/2018   K 3.8 07/29/2018   CL 103 07/29/2018   CREATININE 1.11 07/29/2018   BUN 14 07/29/2018   CO2 26 07/29/2018   TSH 0.15 (L) 07/17/2018   INR 1.0 08/01/2018    CT RENAL STONE STUDY  Result Date: 08/01/2018 CLINICAL DATA:  Hematuria, loose stools EXAM: CT ABDOMEN AND PELVIS WITHOUT CONTRAST TECHNIQUE: Multidetector CT imaging of the abdomen and pelvis was performed following the standard protocol without IV contrast. COMPARISON:  12/24/2014 FINDINGS: Lower chest: No acute abnormality. Coronary artery calcifications.  Hepatobiliary: No focal liver abnormality is seen. No gallstones, gallbladder wall thickening, or biliary dilatation. Pancreas: Unremarkable. No pancreatic ductal dilatation or surrounding inflammatory changes. Spleen: Normal in size without focal abnormality. Adrenals/Urinary Tract: Adrenal glands are unremarkable. Kidneys are normal, without renal calculi, focal lesion, or hydronephrosis. Multiple small calculi in the dependent urinary bladder as seen on prior examination. Stomach/Bowel: Stomach  is within normal limits. Appendix appears normal. No evidence of bowel wall thickening, distention, or inflammatory changes. Vascular/Lymphatic: Calcific atherosclerosis. No enlarged abdominal or pelvic lymph nodes. Reproductive: No mass or other abnormality. Status post hysterectomy. Other: No abdominal wall hernia or abnormality. No abdominopelvic ascites. Musculoskeletal: No acute or significant osseous findings. IMPRESSION: 1. No acute noncontrast CT findings of the abdomen or pelvis to explain hematuria or loose stools. 2. Multiple small calculi within the dependent urinary bladder, similar to prior study. Electronically Signed   By: Lauralyn PrimesAlex  Bibbey M.D.   On: 08/01/2018 15:42    Assessment & Plan:   There are no diagnoses linked to this encounter.   No orders of the defined types were placed in this encounter.    Follow-up: No follow-ups on file.  Sonda PrimesAlex Tovah Slavick, MD

## 2019-05-12 NOTE — Assessment & Plan Note (Signed)
Allopurinol No relapse 

## 2019-05-12 NOTE — Assessment & Plan Note (Signed)
Depo-Estradiol 0.3 ml  Potential benefits of a long term HRT  use as well as potential risks  and complications were explained to the patient and were aknowledged. 

## 2019-05-12 NOTE — Assessment & Plan Note (Signed)
Toprol, Nifedipine BP ok at home 

## 2019-05-13 ENCOUNTER — Encounter: Payer: Self-pay | Admitting: Internal Medicine

## 2019-05-16 ENCOUNTER — Other Ambulatory Visit: Payer: Self-pay | Admitting: Internal Medicine

## 2019-05-18 ENCOUNTER — Ambulatory Visit: Payer: Medicare Other | Admitting: Cardiovascular Disease

## 2019-05-19 ENCOUNTER — Other Ambulatory Visit: Payer: Self-pay | Admitting: Internal Medicine

## 2019-05-19 ENCOUNTER — Telehealth: Payer: Self-pay

## 2019-05-19 DIAGNOSIS — Z1231 Encounter for screening mammogram for malignant neoplasm of breast: Secondary | ICD-10-CM | POA: Diagnosis not present

## 2019-05-19 DIAGNOSIS — Z803 Family history of malignant neoplasm of breast: Secondary | ICD-10-CM | POA: Diagnosis not present

## 2019-05-19 MED ORDER — FUROSEMIDE 40 MG PO TABS: 40.0000 mg | ORAL_TABLET | Freq: Two times a day (BID) | ORAL | 3 refills | Status: DC

## 2019-05-19 MED ORDER — LORAZEPAM 0.5 MG PO TABS: 0.5000 mg | ORAL_TABLET | Freq: Every day | ORAL | 1 refills | Status: DC

## 2019-05-19 MED ORDER — NIFEDIPINE ER OSMOTIC RELEASE 30 MG PO TB24: 30.0000 mg | ORAL_TABLET | Freq: Every day | ORAL | 3 refills | Status: DC

## 2019-05-19 MED ORDER — LEVOTHYROXINE SODIUM 137 MCG PO TABS: ORAL_TABLET | ORAL | 3 refills | Status: DC

## 2019-05-19 MED ORDER — DOXEPIN HCL 50 MG PO CAPS: 150.0000 mg | ORAL_CAPSULE | Freq: Every day | ORAL | 3 refills | Status: DC

## 2019-05-19 MED ORDER — POTASSIUM CHLORIDE CRYS ER 20 MEQ PO TBCR: 20.0000 meq | EXTENDED_RELEASE_TABLET | Freq: Two times a day (BID) | ORAL | 3 refills | Status: DC

## 2019-05-19 MED ORDER — CLORAZEPATE DIPOTASSIUM 15 MG PO TABS: 15.0000 mg | ORAL_TABLET | Freq: Three times a day (TID) | ORAL | 1 refills | Status: DC | PRN

## 2019-05-19 MED ORDER — NITROGLYCERIN 0.4 MG/SPRAY TL SOLN: 1 refills | Status: DC

## 2019-05-19 MED ORDER — DEPO-ESTRADIOL 5 MG/ML IM OIL: TOPICAL_OIL | INTRAMUSCULAR | 3 refills | Status: DC

## 2019-05-19 NOTE — Telephone Encounter (Signed)
Still cannot print RX, please sign orders

## 2019-05-19 NOTE — Telephone Encounter (Signed)
Pharmacy given okay 

## 2019-05-19 NOTE — Telephone Encounter (Signed)
Pt calling to check on this.  States that she is completely out of medication and needs to know what to do.

## 2019-05-19 NOTE — Telephone Encounter (Signed)
Copied from [place] [phone]. Topic: Quick Communication - Rx Refill/Question >> May 19, 2019  1:42 PM Erick Blinks wrote: Pharmacy called in regards to levothyroxine refill, manufacturer does not make this dose anymore however another manufacturer does. Please advise if it is okay to use "[facility] [address] [place] - [address] [place] [place] [address] Phone: [phone] Fax: [phone]

## 2019-05-23 MED ORDER — NITROGLYCERIN 0.4 MG/SPRAY TL SOLN: 1 refills | Status: DC

## 2019-05-23 MED ORDER — FUROSEMIDE 40 MG PO TABS: 40.0000 mg | ORAL_TABLET | Freq: Two times a day (BID) | ORAL | 3 refills | Status: DC

## 2019-05-23 MED ORDER — DOXEPIN HCL 50 MG PO CAPS: 150.0000 mg | ORAL_CAPSULE | Freq: Every day | ORAL | 3 refills | Status: DC

## 2019-05-23 MED ORDER — NIFEDIPINE ER OSMOTIC RELEASE 30 MG PO TB24: 30.0000 mg | ORAL_TABLET | Freq: Every day | ORAL | 3 refills | Status: DC

## 2019-05-23 MED ORDER — POTASSIUM CHLORIDE CRYS ER 20 MEQ PO TBCR: 20.0000 meq | EXTENDED_RELEASE_TABLET | Freq: Two times a day (BID) | ORAL | 3 refills | Status: DC

## 2019-05-23 MED ORDER — DEPO-ESTRADIOL 5 MG/ML IM OIL: TOPICAL_OIL | INTRAMUSCULAR | 3 refills | Status: DC

## 2019-05-23 MED ORDER — LORAZEPAM 0.5 MG PO TABS: 0.5000 mg | ORAL_TABLET | Freq: Every day | ORAL | 1 refills | Status: DC

## 2019-05-23 MED ORDER — CLORAZEPATE DIPOTASSIUM 15 MG PO TABS: 15.0000 mg | ORAL_TABLET | Freq: Three times a day (TID) | ORAL | 1 refills | Status: DC | PRN

## 2019-05-23 MED ORDER — LEVOTHYROXINE SODIUM 137 MCG PO TABS: ORAL_TABLET | ORAL | 3 refills | Status: DC

## 2019-06-08 ENCOUNTER — Encounter: Payer: Self-pay | Admitting: Family

## 2019-06-08 ENCOUNTER — Ambulatory Visit (INDEPENDENT_AMBULATORY_CARE_PROVIDER_SITE_OTHER): Payer: Medicare Other | Admitting: Family

## 2019-06-08 ENCOUNTER — Other Ambulatory Visit: Payer: Self-pay

## 2019-06-08 VITALS — BP 142/88 | HR 92 | Temp 97.8°F | Ht 68.0 in | Wt 147.2 lb

## 2019-06-08 DIAGNOSIS — R319 Hematuria, unspecified: Secondary | ICD-10-CM | POA: Diagnosis not present

## 2019-06-08 LAB — POC URINALSYSI DIPSTICK (AUTOMATED)
Bilirubin, UA: NEGATIVE
Blood, UA: NEGATIVE
Glucose, UA: NEGATIVE
Ketones, UA: NEGATIVE
Nitrite, UA: NEGATIVE
Protein, UA: NEGATIVE
Spec Grav, UA: 1.02 (ref 1.010–1.025)
Urobilinogen, UA: 0.2 E.U./dL
pH, UA: 5.5 (ref 5.0–8.0)

## 2019-06-08 MED ORDER — SULFAMETHOXAZOLE-TRIMETHOPRIM 800-160 MG PO TABS: 1.0000 | ORAL_TABLET | Freq: Two times a day (BID) | ORAL | 0 refills | Status: DC

## 2019-06-08 NOTE — Progress Notes (Signed)
[name] is a 74 y.o. female with the following history as recorded in [employer]:  Patient Active Problem List   Diagnosis Date Noted  . Statin myopathy 04/18/2019  . Dog bite of arm 02/15/2019  . Right arm pain 02/06/2019  . Atrophic vaginitis 10/20/2018  . Urinary bladder stone 08/06/2018  . Hematuria, gross 08/06/2018  . Edema 04/21/2018  . Herpes zoster 02/20/2018  . Occipital neuralgia of left side 01/07/2018  . Hormone replacement therapy (HRT) 07/25/2017  . Hair loss 05/31/2017  . Upper respiratory infection 05/01/2017  . Achilles tendinitis 03/12/2017  . Gout 01/09/2017  . Shoulder effusion, right 12/28/2016  . Neck stiffness 12/28/2016  . Heel pain, chronic, left 12/11/2016  . Calcaneal bursitis (heel), left 11/13/2016  . Toenail avulsion, initial encounter 11/13/2016  . Traumatic hematoma of foot, right, sequela 09/27/2016  . Adnexal mass 01/04/2016  . Right adrenal mass ([place]) 12/29/2015  . Cataract 10/10/2015  . Abnormal abdominal x-ray 12/23/2014  . GERD (gastroesophageal reflux disease) 11/07/2012  . Hot flash, menopausal 09/10/2012  . Well adult exam 06/13/2011  . GAIT DISTURBANCE 11/22/2009  . PARESTHESIA 11/22/2009  . BACK PAIN, LUMBAR 11/03/2009  . Hypothyroidism 02/11/2007  . Dyslipidemia 02/11/2007  . Chronic anxiety 02/11/2007  . Depression 02/11/2007  . Essential hypertension 02/11/2007  . Coronary atherosclerosis 02/11/2007  . 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 CAROTID BRUIT 02/11/2007    Current Outpatient Medications  Medication Sig Dispense Refill  . aspirin 81 MG EC tablet Take 81 mg by mouth daily.      . Cholecalciferol 1000 UNITS tablet Take 1,000 Units by mouth daily.      . clobetasol ointment (TEMOVATE) 2.29 % Apply 1 application topically 2 (two) times daily. Apply as directed twice daily for up to 2 weeks, then space out to 1-2 x a week. Not for daily long term use. 30 g 0  . clorazepate (TRANXENE) 15 MG tablet Take 1 tablet  (15 mg total) by mouth 3 (three) times daily as needed. for anxiety 270 tablet 1  . doxepin (SINEQUAN) 50 MG capsule Take 3 capsules (150 mg total) by mouth at bedtime. 270 capsule 3  . estradiol cypionate (DEPO-ESTRADIOL) 5 MG/ML injection Use 0.3 ml every 18-25 days IM 5 mL 3  . furosemide (LASIX) 40 MG tablet Take 1 tablet (40 mg total) by mouth 2 (two) times daily. 180 tablet 3  . levothyroxine (SYNTHROID) 137 MCG tablet TAKE 1 TABLET BY MOUTH ONCE DAILY BEFORE BREAKFAST 90 tablet 3  . LORazepam (ATIVAN) 0.5 MG tablet Take 1 tablet (0.5 mg total) by mouth at bedtime. 90 tablet 1  . methylPREDNISolone (MEDROL DOSEPAK) 4 MG TBPK tablet As directed 21 tablet 0  . metoprolol tartrate (LOPRESSOR) 50 MG tablet Take 50 mg by mouth 3 (three) times daily.    . mupirocin ointment (BACTROBAN) 2 % On leg wound w/dressing change qd or bid 22 g 0  . NIFEdipine (PROCARDIA-XL/NIFEDICAL-XL) 30 MG 24 hr tablet Take 1 tablet (30 mg total) by mouth daily. 90 tablet 3  . nitroGLYCERIN (NITROLINGUAL) 0.4 MG/SPRAY spray PLACE 1 SPRAY UNDER THE TONGUE EVERY 5 MINUTES AS NEEDED FOR CHEST PAIN. MAY REPEAT 3 TIMES 12 g 1  . potassium chloride SA (KLOR-CON M20) 20 MEQ tablet Take 1 tablet (20 mEq total) by mouth 2 (two) times daily. 180 tablet 3  . LORazepam (ATIVAN) 0.5 MG tablet TAKE 1 TABLET BY MOUTH AT BEDTIME (Patient not taking: Reported on 06/08/2019) 90 tablet 1  .  sulfamethoxazole-trimethoprim (BACTRIM DS) 800-160 MG tablet Take 1 tablet by mouth 2 (two) times daily. 6 tablet 0   Current Facility-Administered Medications  Medication Dose Route Frequency Provider Last Rate Last Admin  . estradiol cypionate (DEPO-ESTRADIOL) 5 MG/ML injection 1.5 mg  1.5 mg Intramuscular Q30 days Plotnikov, Georgina Quint, MD   1.5 mg at 05/12/19 1628    Allergies: Epinephrine, Isosorbide mononitrate, Lidocaine, Morphine, Propoxyphene n-acetaminophen, Alprazolam, Atorvastatin, Codeine, Ezetimibe, Prilosec [omeprazole], Rosuvastatin,  Simvastatin, Allopurinol, Meloxicam, Tetracycline, Triple antibiotic [bacitracin-neomycin-polymyxin], Amoxicillin, Ciprofloxacin, and Diphenhydramine hcl  Past Medical History:  Diagnosis Date  . Anxiety state, unspecified   . Bronchitis, acute   . Carotid bruit   . Coronary atherosclerosis of unspecified type of vessel, native or graft    multiple angioplasy procedures in 90's/early 2000's  . De Quervain's tenosynovitis   . Depressive disorder, not elsewhere classified   . Female bladder prolapse   . Lichen 2011   Vaginal  . Other and unspecified hyperlipidemia   . Unspecified essential hypertension   . Unspecified hypothyroidism     Past Surgical History:  Procedure Laterality Date  . MOUTH SURGERY    . NOSE SURGERY  1999  . TOTAL VAGINAL HYSTERECTOMY  1994    Family History  Problem Relation Age of Onset  . Hypertension Mother   . Hypertension Father   . Hypertension Other   . Coronary artery disease Other     Social History   Tobacco Use  . Smoking status: Former Smoker    Quit date: 07/22/1991    Years since quitting: 27.8  . Smokeless tobacco: Never Used  Substance Use Topics  . Alcohol use: No    Subjective:  Patient started last week with small amount of blood in her urine last Friday evening; was concerned that she might have passed a kidney stone; notes that she started drinking more water and has actually started to feel better; denies any burning on urination but does have frequency and some mild pelvic pain; no fever; has not seen any blood in her urine in the past 24 hours.       Objective:  Vitals:   06/08/19 1337  BP: (!) 142/88  Pulse: 92  Temp: 97.8 F (36.6 C)  TempSrc: Oral  SpO2: 97%  Weight: 147 lb 3.2 oz (66.8 kg)  Height: 5\' 8"  (1.727 m)    General: Well developed, well nourished, in no acute distress  Skin : Warm and dry.  Head: Normocephalic and atraumatic  Lungs: Respirations unlabored; clear to auscultation bilaterally without  wheeze, rales, rhonchi  Neurologic: Alert and oriented; speech intact; face symmetrical; moves all extremities well; CNII-XII intact without focal deficit   Assessment:  1. Hematuria, unspecified type     Plan:  Check U/A and urine culture; Rx for Bactrim DS bid x 3 days; if culture does not show infection, will plan to order imaging to evaluate for source of blood.   No follow-ups on file.  Orders Placed This Encounter  Procedures  . Urine Culture    Requested Prescriptions   Signed Prescriptions Disp Refills  . sulfamethoxazole-trimethoprim (BACTRIM DS) 800-160 MG tablet 6 tablet 0    Sig: Take 1 tablet by mouth 2 (two) times daily.

## 2019-06-08 NOTE — Addendum Note (Signed)
Addended by: Karma Ganja on: 06/08/2019 03:34 PM   Modules accepted: Orders

## 2019-06-09 ENCOUNTER — Ambulatory Visit (INDEPENDENT_AMBULATORY_CARE_PROVIDER_SITE_OTHER): Payer: Medicare Other | Admitting: Internal Medicine

## 2019-06-09 ENCOUNTER — Encounter: Payer: Self-pay | Admitting: Internal Medicine

## 2019-06-09 DIAGNOSIS — I10 Essential (primary) hypertension: Secondary | ICD-10-CM

## 2019-06-09 MED ORDER — METOPROLOL TARTRATE 50 MG PO TABS: 50.0000 mg | ORAL_TABLET | Freq: Three times a day (TID) | ORAL | 3 refills | Status: DC

## 2019-06-09 MED ORDER — NITROGLYCERIN 0.4 MG/SPRAY TL SOLN: 1 refills | Status: DC

## 2019-06-09 MED ORDER — DOXEPIN HCL 50 MG PO CAPS: 150.0000 mg | ORAL_CAPSULE | Freq: Every day | ORAL | 3 refills | Status: DC

## 2019-06-09 MED ORDER — LEVOTHYROXINE SODIUM 137 MCG PO TABS: ORAL_TABLET | ORAL | 3 refills | Status: DC

## 2019-06-09 MED ORDER — POTASSIUM CHLORIDE CRYS ER 20 MEQ PO TBCR: 20.0000 meq | EXTENDED_RELEASE_TABLET | Freq: Two times a day (BID) | ORAL | 3 refills | Status: DC

## 2019-06-09 MED ORDER — DEPO-ESTRADIOL 5 MG/ML IM OIL: TOPICAL_OIL | INTRAMUSCULAR | 3 refills | Status: DC

## 2019-06-09 MED ORDER — NIFEDIPINE ER OSMOTIC RELEASE 30 MG PO TB24: 30.0000 mg | ORAL_TABLET | Freq: Every day | ORAL | 3 refills | Status: DC

## 2019-06-09 MED ORDER — LORAZEPAM 0.5 MG PO TABS: 0.5000 mg | ORAL_TABLET | Freq: Every day | ORAL | 1 refills | Status: DC

## 2019-06-09 MED ORDER — FUROSEMIDE 40 MG PO TABS: 40.0000 mg | ORAL_TABLET | Freq: Two times a day (BID) | ORAL | 3 refills | Status: DC

## 2019-06-09 MED ORDER — CLORAZEPATE DIPOTASSIUM 15 MG PO TABS: 15.0000 mg | ORAL_TABLET | Freq: Three times a day (TID) | ORAL | 1 refills | Status: DC | PRN

## 2019-06-09 NOTE — Progress Notes (Signed)
Subjective:  Patient ID: [name], female    DOB: 15-Nov-1945  Age: 74 y.o. MRN: [id number]  CC: No chief complaint on file.   HPI [name] presents for red urine on this past Fri and Sat, tiny clots. Follow-up on hypertension, hypothyroidism, menopausal symptoms  Outpatient Medications Prior to Visit  Medication Sig Dispense Refill  . aspirin 81 MG EC tablet Take 81 mg by mouth daily.      . Cholecalciferol 1000 UNITS tablet Take 1,000 Units by mouth daily.      . clobetasol ointment (TEMOVATE) 0.63 % Apply 1 application topically 2 (two) times daily. Apply as directed twice daily for up to 2 weeks, then space out to 1-2 x a week. Not for daily long term use. 30 g 0  . clorazepate (TRANXENE) 15 MG tablet Take 1 tablet (15 mg total) by mouth 3 (three) times daily as needed. for anxiety 270 tablet 1  . doxepin (SINEQUAN) 50 MG capsule Take 3 capsules (150 mg total) by mouth at bedtime. 270 capsule 3  . estradiol cypionate (DEPO-ESTRADIOL) 5 MG/ML injection Use 0.3 ml every 18-25 days IM 5 mL 3  . furosemide (LASIX) 40 MG tablet Take 1 tablet (40 mg total) by mouth 2 (two) times daily. 180 tablet 3  . levothyroxine (SYNTHROID) 137 MCG tablet TAKE 1 TABLET BY MOUTH ONCE DAILY BEFORE BREAKFAST 90 tablet 3  . LORazepam (ATIVAN) 0.5 MG tablet Take 1 tablet (0.5 mg total) by mouth at bedtime. 90 tablet 1  . LORazepam (ATIVAN) 0.5 MG tablet TAKE 1 TABLET BY MOUTH AT BEDTIME 90 tablet 1  . methylPREDNISolone (MEDROL DOSEPAK) 4 MG TBPK tablet As directed 21 tablet 0  . metoprolol tartrate (LOPRESSOR) 50 MG tablet Take 50 mg by mouth 3 (three) times daily.    . mupirocin ointment (BACTROBAN) 2 % On leg wound w/dressing change qd or bid 22 g 0  . NIFEdipine (PROCARDIA-XL/NIFEDICAL-XL) 30 MG 24 hr tablet Take 1 tablet (30 mg total) by mouth daily. 90 tablet 3  . nitroGLYCERIN (NITROLINGUAL) 0.4 MG/SPRAY spray PLACE 1 SPRAY UNDER THE TONGUE EVERY 5 MINUTES AS NEEDED FOR CHEST PAIN. MAY REPEAT 3  TIMES 12 g 1  . potassium chloride SA (KLOR-CON M20) 20 MEQ tablet Take 1 tablet (20 mEq total) by mouth 2 (two) times daily. 180 tablet 3  . sulfamethoxazole-trimethoprim (BACTRIM DS) 800-160 MG tablet Take 1 tablet by mouth 2 (two) times daily. 6 tablet 0   Facility-Administered Medications Prior to Visit  Medication Dose Route Frequency Provider Last Rate Last Admin  . estradiol cypionate (DEPO-ESTRADIOL) 5 MG/ML injection 1.5 mg  1.5 mg Intramuscular Q30 days Cornelious Bartolucci, Evie Lacks, MD   1.5 mg at 05/12/19 1628    ROS: Review of Systems  Constitutional: Negative for fatigue.  Genitourinary: Positive for hematuria. Negative for urgency.    Objective:  BP (!) 156/78 (BP Location: Left Arm, Patient Position: Sitting, Cuff Size: Normal)   Pulse 75   Temp 98 F (36.7 C) (Oral)   Ht 5\' 8"  (1.727 m)   Wt 147 lb (66.7 kg)   SpO2 96%   BMI 22.35 kg/m   BP Readings from Last 3 Encounters:  06/09/19 (!) 156/78  06/08/19 (!) 142/88  05/12/19 (!) 172/76    Wt Readings from Last 3 Encounters:  06/09/19 147 lb (66.7 kg)  06/08/19 147 lb 3.2 oz (66.8 kg)  05/12/19 152 lb (68.9 kg)    Physical Exam Constitutional:  Appearance: Normal appearance.  Neurological:     Mental Status: She is oriented to person, place, and time.    anxious  Lab Results  Component Value Date   WBC 4.8 07/29/2018   HGB 12.4 07/29/2018   HCT 36.9 07/29/2018   PLT 239.0 07/29/2018   GLUCOSE 91 07/29/2018   CHOL 236 (H) 11/02/2016   TRIG 100.0 11/02/2016   HDL 60.60 11/02/2016   LDLDIRECT 188.2 01/10/2012   LDLCALC 155 (H) 11/02/2016   ALT 14 07/29/2018   AST 19 07/29/2018   NA 139 07/29/2018   K 3.8 07/29/2018   CL 103 07/29/2018   CREATININE 1.11 07/29/2018   BUN 14 07/29/2018   CO2 26 07/29/2018   TSH 0.15 (L) 07/17/2018   INR 1.0 08/01/2018    CT RENAL STONE STUDY  Result Date: 08/01/2018 CLINICAL DATA:  Hematuria, loose stools EXAM: CT ABDOMEN AND PELVIS WITHOUT CONTRAST  TECHNIQUE: Multidetector CT imaging of the abdomen and pelvis was performed following the standard protocol without IV contrast. COMPARISON:  12/24/2014 FINDINGS: Lower chest: No acute abnormality. Coronary artery calcifications. Hepatobiliary: No focal liver abnormality is seen. No gallstones, gallbladder wall thickening, or biliary dilatation. Pancreas: Unremarkable. No pancreatic ductal dilatation or surrounding inflammatory changes. Spleen: Normal in size without focal abnormality. Adrenals/Urinary Tract: Adrenal glands are unremarkable. Kidneys are normal, without renal calculi, focal lesion, or hydronephrosis. Multiple small calculi in the dependent urinary bladder as seen on prior examination. Stomach/Bowel: Stomach is within normal limits. Appendix appears normal. No evidence of bowel wall thickening, distention, or inflammatory changes. Vascular/Lymphatic: Calcific atherosclerosis. No enlarged abdominal or pelvic lymph nodes. Reproductive: No mass or other abnormality. Status post hysterectomy. Other: No abdominal wall hernia or abnormality. No abdominopelvic ascites. Musculoskeletal: No acute or significant osseous findings. IMPRESSION: 1. No acute noncontrast CT findings of the abdomen or pelvis to explain hematuria or loose stools. 2. Multiple small calculi within the dependent urinary bladder, similar to prior study. Electronically Signed   By: Lauralyn Primes M.D.   On: 08/01/2018 15:42    Assessment & Plan:   There are no diagnoses linked to this encounter.   No orders of the defined types were placed in this encounter.    Follow-up: No follow-ups on file.  Sonda Primes, MD

## 2019-06-09 NOTE — Patient Instructions (Signed)
CT scan IMPRESSION: 1. No acute noncontrast CT findings of the abdomen or pelvis to explain hematuria or loose stools. 2. Multiple small calculi within the dependent urinary bladder, similar to prior study.   Electronically Signed   By: Lauralyn Primes M.D.   On: 08/01/2018 15:42

## 2019-06-10 LAB — URINE CULTURE

## 2019-06-14 ENCOUNTER — Encounter: Payer: Self-pay | Admitting: Internal Medicine

## 2019-06-14 NOTE — Assessment & Plan Note (Signed)
Continue with Toprol and nifedipine

## 2019-06-25 ENCOUNTER — Ambulatory Visit (INDEPENDENT_AMBULATORY_CARE_PROVIDER_SITE_OTHER): Payer: Medicare Other | Admitting: Internal Medicine

## 2019-06-25 ENCOUNTER — Other Ambulatory Visit: Payer: Self-pay

## 2019-06-25 ENCOUNTER — Encounter: Payer: Self-pay | Admitting: Internal Medicine

## 2019-06-25 VITALS — BP 150/84 | HR 87 | Temp 97.8°F | Ht 68.0 in | Wt 148.6 lb

## 2019-06-25 DIAGNOSIS — M10072 Idiopathic gout, left ankle and foot: Secondary | ICD-10-CM | POA: Diagnosis not present

## 2019-06-25 DIAGNOSIS — I2510 Atherosclerotic heart disease of native coronary artery without angina pectoris: Secondary | ICD-10-CM

## 2019-06-25 DIAGNOSIS — N8110 Cystocele, unspecified: Secondary | ICD-10-CM

## 2019-06-25 DIAGNOSIS — N951 Menopausal and female climacteric states: Secondary | ICD-10-CM | POA: Diagnosis not present

## 2019-06-25 DIAGNOSIS — R300 Dysuria: Secondary | ICD-10-CM

## 2019-06-25 DIAGNOSIS — R310 Gross hematuria: Secondary | ICD-10-CM

## 2019-06-25 LAB — POC URINALSYSI DIPSTICK (AUTOMATED)
Bilirubin, UA: NEGATIVE
Glucose, UA: NEGATIVE
Ketones, UA: NEGATIVE
Nitrite, UA: NEGATIVE
Protein, UA: NEGATIVE
Spec Grav, UA: 1.025 (ref 1.010–1.025)
Urobilinogen, UA: 0.2 E.U./dL
pH, UA: 5.5 (ref 5.0–8.0)

## 2019-06-25 NOTE — Progress Notes (Signed)
Subjective:  Patient ID: [name], female    DOB: 10/04/1945  Age: 74 y.o. MRN: [id number]  CC: Urinary Tract Infection (Blood in urine on Tuesday but none today)   HPI [name] presents for menopause, UTI (E coli) recurrent Follow-up on hypertension and anxiety  Outpatient Medications Prior to Visit  Medication Sig Dispense Refill  . aspirin 81 MG EC tablet Take 81 mg by mouth daily.      . Cholecalciferol 1000 UNITS tablet Take 1,000 Units by mouth daily.      . clobetasol ointment (TEMOVATE) 0.05 % Apply 1 application topically 2 (two) times daily. Apply as directed twice daily for up to 2 weeks, then space out to 1-2 x a week. Not for daily long term use. 30 g 0  . clorazepate (TRANXENE) 15 MG tablet Take 1 tablet (15 mg total) by mouth 3 (three) times daily as needed. for anxiety 270 tablet 1  . doxepin (SINEQUAN) 50 MG capsule Take 3 capsules (150 mg total) by mouth at bedtime. 270 capsule 3  . estradiol cypionate (DEPO-ESTRADIOL) 5 MG/ML injection Use 0.3 ml every 18-25 days IM 5 mL 3  . furosemide (LASIX) 40 MG tablet Take 1 tablet (40 mg total) by mouth 2 (two) times daily. 180 tablet 3  . levothyroxine (SYNTHROID) 137 MCG tablet TAKE 1 TABLET BY MOUTH ONCE DAILY BEFORE BREAKFAST 90 tablet 3  . LORazepam (ATIVAN) 0.5 MG tablet TAKE 1 TABLET BY MOUTH AT BEDTIME 90 tablet 1  . methylPREDNISolone (MEDROL DOSEPAK) 4 MG TBPK tablet As directed 21 tablet 0  . metoprolol tartrate (LOPRESSOR) 50 MG tablet Take 1 tablet (50 mg total) by mouth 3 (three) times daily. 270 tablet 3  . mupirocin ointment (BACTROBAN) 2 % On leg wound w/dressing change qd or bid 22 g 0  . NIFEdipine (PROCARDIA-XL/NIFEDICAL-XL) 30 MG 24 hr tablet Take 1 tablet (30 mg total) by mouth daily. 90 tablet 3  . nitroGLYCERIN (NITROLINGUAL) 0.4 MG/SPRAY spray PLACE 1 SPRAY UNDER THE TONGUE EVERY 5 MINUTES AS NEEDED FOR CHEST PAIN. MAY REPEAT 3 TIMES 12 g 1  . potassium chloride SA (KLOR-CON M20) 20 MEQ tablet Take  1 tablet (20 mEq total) by mouth 2 (two) times daily. 180 tablet 3  . sulfamethoxazole-trimethoprim (BACTRIM DS) 800-160 MG tablet Take 1 tablet by mouth 2 (two) times daily. 6 tablet 0   Facility-Administered Medications Prior to Visit  Medication Dose Route Frequency Provider Last Rate Last Admin  . estradiol cypionate (DEPO-ESTRADIOL) 5 MG/ML injection 1.5 mg  1.5 mg Intramuscular Q30 days Azile Minardi, Georgina Quint, MD   1.5 mg at 05/12/19 1628    ROS: Review of Systems  Constitutional: Negative for activity change, appetite change, chills, fatigue and unexpected weight change.  HENT: Negative for congestion, mouth sores and sinus pressure.   Eyes: Negative for visual disturbance.  Respiratory: Negative for cough and chest tightness.   Gastrointestinal: Negative for abdominal pain and nausea.  Genitourinary: Positive for hematuria and urgency. Negative for difficulty urinating, frequency and vaginal pain.  Musculoskeletal: Positive for arthralgias. Negative for back pain and gait problem.  Skin: Negative for pallor and rash.  Neurological: Negative for dizziness, tremors, weakness, numbness and headaches.  Psychiatric/Behavioral: Negative for confusion, sleep disturbance and suicidal ideas.    Objective:  BP (!) 150/84 (BP Location: Left Arm, Patient Position: Sitting, Cuff Size: Normal)   Pulse 87   Temp 97.8 F (36.6 C) (Oral)   Ht 5\' 8"  (1.727 m)  Wt 148 lb 9.6 oz (67.4 kg)   SpO2 97%   BMI 22.59 kg/m   BP Readings from Last 3 Encounters:  06/25/19 (!) 150/84  06/09/19 (!) 156/78  06/08/19 (!) 142/88    Wt Readings from Last 3 Encounters:  06/25/19 148 lb 9.6 oz (67.4 kg)  06/09/19 147 lb (66.7 kg)  06/08/19 147 lb 3.2 oz (66.8 kg)    Physical Exam Constitutional:      General: She is not in acute distress.    Appearance: She is well-developed.  HENT:     Head: Normocephalic.     Right Ear: External ear normal.     Left Ear: External ear normal.     Nose: Nose  normal.  Eyes:     General:        Right eye: No discharge.        Left eye: No discharge.     Conjunctiva/sclera: Conjunctivae normal.     Pupils: Pupils are equal, round, and reactive to light.  Neck:     Thyroid: No thyromegaly.     Vascular: No JVD.     Trachea: No tracheal deviation.  Cardiovascular:     Rate and Rhythm: Normal rate and regular rhythm.     Heart sounds: Normal heart sounds.  Pulmonary:     Effort: No respiratory distress.     Breath sounds: No stridor. No wheezing.  Abdominal:     General: Bowel sounds are normal. There is no distension.     Palpations: Abdomen is soft. There is no mass.     Tenderness: There is no abdominal tenderness. There is no guarding or rebound.  Musculoskeletal:        General: No tenderness.     Cervical back: Normal range of motion and neck supple.  Lymphadenopathy:     Cervical: No cervical adenopathy.  Skin:    Findings: No erythema or rash.  Neurological:     Cranial Nerves: No cranial nerve deficit.     Motor: No abnormal muscle tone.     Coordination: Coordination normal.     Deep Tendon Reflexes: Reflexes normal.  Psychiatric:        Behavior: Behavior normal.        Thought Content: Thought content normal.        Judgment: Judgment normal.   tearful, anxious  Lab Results  Component Value Date   WBC 4.8 07/29/2018   HGB 12.4 07/29/2018   HCT 36.9 07/29/2018   PLT 239.0 07/29/2018   GLUCOSE 91 07/29/2018   CHOL 236 (H) 11/02/2016   TRIG 100.0 11/02/2016   HDL 60.60 11/02/2016   LDLDIRECT 188.2 01/10/2012   LDLCALC 155 (H) 11/02/2016   ALT 14 07/29/2018   AST 19 07/29/2018   NA 139 07/29/2018   K 3.8 07/29/2018   CL 103 07/29/2018   CREATININE 1.11 07/29/2018   BUN 14 07/29/2018   CO2 26 07/29/2018   TSH 0.15 (L) 07/17/2018   INR 1.0 08/01/2018    CT RENAL STONE STUDY  Result Date: 08/01/2018 CLINICAL DATA:  Hematuria, loose stools EXAM: CT ABDOMEN AND PELVIS WITHOUT CONTRAST TECHNIQUE:  Multidetector CT imaging of the abdomen and pelvis was performed following the standard protocol without IV contrast. COMPARISON:  12/24/2014 FINDINGS: Lower chest: No acute abnormality. Coronary artery calcifications. Hepatobiliary: No focal liver abnormality is seen. No gallstones, gallbladder wall thickening, or biliary dilatation. Pancreas: Unremarkable. No pancreatic ductal dilatation or surrounding inflammatory changes. Spleen: Normal in size without  focal abnormality. Adrenals/Urinary Tract: Adrenal glands are unremarkable. Kidneys are normal, without renal calculi, focal lesion, or hydronephrosis. Multiple small calculi in the dependent urinary bladder as seen on prior examination. Stomach/Bowel: Stomach is within normal limits. Appendix appears normal. No evidence of bowel wall thickening, distention, or inflammatory changes. Vascular/Lymphatic: Calcific atherosclerosis. No enlarged abdominal or pelvic lymph nodes. Reproductive: No mass or other abnormality. Status post hysterectomy. Other: No abdominal wall hernia or abnormality. No abdominopelvic ascites. Musculoskeletal: No acute or significant osseous findings. IMPRESSION: 1. No acute noncontrast CT findings of the abdomen or pelvis to explain hematuria or loose stools. 2. Multiple small calculi within the dependent urinary bladder, similar to prior study. Electronically Signed   By: Lauralyn Primes M.D.   On: 08/01/2018 15:42    Assessment & Plan:   There are no diagnoses linked to this encounter.   No orders of the defined types were placed in this encounter.    Follow-up: No follow-ups on file.  Sonda Primes, MD

## 2019-06-25 NOTE — Assessment & Plan Note (Signed)
No relapse 

## 2019-06-25 NOTE — Assessment & Plan Note (Signed)
Toprol, Procardia

## 2019-06-25 NOTE — Assessment & Plan Note (Signed)
Estradiol inj

## 2019-06-25 NOTE — Patient Instructions (Addendum)
Dr Apolinar Junes [facility] [address] [place], [place] [address] [phone]        Cystoscopy  Cystoscopy is a procedure that is used to help diagnose and sometimes treat conditions that affect the lower urinary tract. The lower urinary tract includes the bladder and the urethra. The urethra is the tube that drains urine from the bladder. Cystoscopy is done using a thin, tube-shaped instrument with a light and camera at the end (cystoscope). The cystoscope may be hard or flexible, depending on the goal of the procedure. The cystoscope is inserted through the urethra, into the bladder. Cystoscopy may be recommended if you have:  Urinary tract infections that keep coming back.  Blood in the urine (hematuria).  An inability to control when you urinate (urinary incontinence) or an overactive bladder.  Unusual cells found in a urine sample.  A blockage in the urethra, such as a urinary stone.  Painful urination.  An abnormality in the bladder found during an intravenous pyelogram (IVP) or CT scan. Cystoscopy may also be done to remove a sample of tissue to be examined under a microscope (biopsy). Tell a health care provider about:  Any allergies you have.  All medicines you are taking, including vitamins, herbs, eye drops, creams, and over-the-counter medicines.  Any problems you or family members have had with anesthetic medicines.  Any blood disorders you have.  Any surgeries you have had.  Any medical conditions you have.  Whether you are pregnant or may be pregnant. What are the risks? Generally, this is a safe procedure. However, problems may occur, including:  Infection.  Bleeding.  Allergic reactions to medicines.  Damage to other structures or organs. What happens before the procedure?  Ask your health care provider about: ? Changing or stopping your regular medicines. This is especially important if you are taking  diabetes medicines or blood thinners. ? Taking medicines such as aspirin and ibuprofen. These medicines can thin your blood. Do not take these medicines unless your health care provider tells you to take them. ? Taking over-the-counter medicines, vitamins, herbs, and supplements.  Follow instructions from your health care provider about eating or drinking restrictions.  Ask your health care provider what steps will be taken to help prevent infection. These may include: ? Washing skin with a germ-killing soap. ? Taking antibiotic medicine.  You may have an exam or testing, such as: ? X-rays of the bladder, urethra, or kidneys. ? Urine tests to check for signs of infection.  Plan to have someone take you home from the hospital or clinic. What happens during the procedure?   You will be given one or more of the following: ? A medicine to help you relax (sedative). ? A medicine to numb the area (local anesthetic).  The area around the opening of your urethra will be cleaned.  The cystoscope will be passed through your urethra into your bladder.  Germ-free (sterile) fluid will flow through the cystoscope to fill your bladder. The fluid will stretch your bladder so that your health care provider can clearly examine your bladder walls.  Your doctor will look at the urethra and bladder. Your doctor may take a biopsy or remove stones.  The cystoscope will be removed, and your bladder will be emptied. The procedure may vary among health care providers and hospitals. What can I expect after the procedure? After the procedure, it is common to have:  Some soreness or pain in your abdomen and urethra.  Urinary symptoms. These include: ? Mild pain or burning when you urinate. Pain should stop within a few minutes after you urinate. This may last for up to 1 week. ? A small amount of blood in your urine for several days. ? Feeling like you need to urinate but producing only a small amount of  urine. Follow these instructions at home: Medicines  Take over-the-counter and prescription medicines only as told by your health care provider.  If you were prescribed an antibiotic medicine, take it as told by your health care provider. Do not stop taking the antibiotic even if you start to feel better. General instructions  Return to your normal activities as told by your health care provider. Ask your health care provider what activities are safe for you.  Do not drive for 24 hours if you were given a sedative during your procedure.  Watch for any blood in your urine. If the amount of blood in your urine increases, call your health care provider.  Follow instructions from your health care provider about eating or drinking restrictions.  If a tissue sample was removed for testing (biopsy) during your procedure, it is up to you to get your test results. Ask your health care provider, or the department that is doing the test, when your results will be ready.  Drink enough fluid to keep your urine pale yellow.  Keep all follow-up visits as told by your health care provider. This is important. Contact a health care provider if you:  Have pain that gets worse or does not get better with medicine, especially pain when you urinate.  Have trouble urinating.  Have more blood in your urine. Get help right away if you:  Have blood clots in your urine.  Have abdominal pain.  Have a fever or chills.  Are unable to urinate. Summary  Cystoscopy is a procedure that is used to help diagnose and sometimes treat conditions that affect the lower urinary tract.  Cystoscopy is done using a thin, tube-shaped instrument with a light and camera at the end.  After the procedure, it is common to have some soreness or pain in your abdomen and urethra.  Watch for any blood in your urine. If the amount of blood in your urine increases, call your health care provider.  If you were prescribed an  antibiotic medicine, take it as told by your health care provider. Do not stop taking the antibiotic even if you start to feel better. This information is not intended to replace advice given to you by your health care provider. Make sure you discuss any questions you have with your health care provider. Document Revised: 04/29/2018 Document Reviewed: 04/29/2018 Elsevier Patient Education  [place].

## 2019-06-25 NOTE — Assessment & Plan Note (Signed)
Dr Apolinar Junes

## 2019-06-25 NOTE — Assessment & Plan Note (Addendum)
The pt would rather see a female urologist - Dr Apolinar Junes. She will let me know

## 2019-07-07 ENCOUNTER — Ambulatory Visit (INDEPENDENT_AMBULATORY_CARE_PROVIDER_SITE_OTHER): Payer: Medicare Other | Admitting: Internal Medicine

## 2019-07-07 ENCOUNTER — Encounter: Payer: Self-pay | Admitting: Internal Medicine

## 2019-07-07 ENCOUNTER — Other Ambulatory Visit: Payer: Self-pay

## 2019-07-07 VITALS — BP 150/98 | HR 71 | Temp 97.6°F | Ht 68.0 in | Wt 148.0 lb

## 2019-07-07 DIAGNOSIS — I2510 Atherosclerotic heart disease of native coronary artery without angina pectoris: Secondary | ICD-10-CM | POA: Diagnosis not present

## 2019-07-07 DIAGNOSIS — R310 Gross hematuria: Secondary | ICD-10-CM | POA: Diagnosis not present

## 2019-07-07 DIAGNOSIS — N951 Menopausal and female climacteric states: Secondary | ICD-10-CM

## 2019-07-07 DIAGNOSIS — Z79890 Hormone replacement therapy (postmenopausal): Secondary | ICD-10-CM | POA: Diagnosis not present

## 2019-07-07 DIAGNOSIS — I10 Essential (primary) hypertension: Secondary | ICD-10-CM

## 2019-07-07 DIAGNOSIS — E785 Hyperlipidemia, unspecified: Secondary | ICD-10-CM

## 2019-07-07 NOTE — Assessment & Plan Note (Signed)
Toprol, Nifedipine 

## 2019-07-07 NOTE — Assessment & Plan Note (Signed)
Toprol, Procardia 

## 2019-07-07 NOTE — Progress Notes (Signed)
Subjective:  Patient ID: [name], female    DOB: [birth year]  Age: 74 y.o. MRN: [id number]  CC: No chief complaint on file.   HPI   [name] presents for HTN, CAD and microhematuria f/u. No blood in urine since last time      Outpatient Medications Prior to Visit  Medication Sig Dispense Refill  . aspirin 81 MG EC tablet Take 81 mg by mouth daily.      . Cholecalciferol 1000 UNITS tablet Take 1,000 Units by mouth daily.      . clobetasol ointment (TEMOVATE) 0.05 % Apply 1 application topically 2 (two) times daily. Apply as directed twice daily for up to 2 weeks, then space out to 1-2 x a week. Not for daily long term use. 30 g 0  . clorazepate (TRANXENE) 15 MG tablet Take 1 tablet (15 mg total) by mouth 3 (three) times daily as needed. for anxiety 270 tablet 1  . doxepin (SINEQUAN) 50 MG capsule Take 3 capsules (150 mg total) by mouth at bedtime. 270 capsule 3  . estradiol cypionate (DEPO-ESTRADIOL) 5 MG/ML injection Use 0.3 ml every 18-25 days IM 5 mL 3  . furosemide (LASIX) 40 MG tablet Take 1 tablet (40 mg total) by mouth 2 (two) times daily. 180 tablet 3  . levothyroxine (SYNTHROID) 137 MCG tablet TAKE 1 TABLET BY MOUTH ONCE DAILY BEFORE BREAKFAST 90 tablet 3  . LORazepam (ATIVAN) 0.5 MG tablet TAKE 1 TABLET BY MOUTH AT BEDTIME 90 tablet 1  . methylPREDNISolone (MEDROL DOSEPAK) 4 MG TBPK tablet As directed 21 tablet 0  . metoprolol tartrate (LOPRESSOR) 50 MG tablet Take 1 tablet (50 mg total) by mouth 3 (three) times daily. 270 tablet 3  . mupirocin ointment (BACTROBAN) 2 % On leg wound w/dressing change qd or bid 22 g 0  . NIFEdipine (PROCARDIA-XL/NIFEDICAL-XL) 30 MG 24 hr tablet Take 1 tablet (30 mg total) by mouth daily. 90 tablet 3  . nitroGLYCERIN (NITROLINGUAL) 0.4 MG/SPRAY spray PLACE 1 SPRAY UNDER THE TONGUE EVERY 5 MINUTES AS NEEDED FOR CHEST PAIN. MAY REPEAT 3 TIMES 12 g 1  . potassium chloride SA (KLOR-CON M20) 20 MEQ tablet Take 1 tablet (20 mEq total) by mouth  2 (two) times daily. 180 tablet 3  . sulfamethoxazole-trimethoprim (BACTRIM DS) 800-160 MG tablet Take 1 tablet by mouth 2 (two) times daily. 6 tablet 0   Facility-Administered Medications Prior to Visit  Medication Dose Route Frequency Provider Last Rate Last Admin  . estradiol cypionate (DEPO-ESTRADIOL) 5 MG/ML injection 1.5 mg  1.5 mg Intramuscular Q30 days Plotnikov, Georgina Quint, MD   1.5 mg at 05/12/19 1628    ROS: Review of Systems  Constitutional: Negative for activity change, appetite change, chills, fatigue and unexpected weight change.  HENT: Negative for congestion, mouth sores and sinus pressure.   Eyes: Negative for visual disturbance.  Respiratory: Negative for cough and chest tightness.   Gastrointestinal: Negative for abdominal pain and nausea.  Genitourinary: Negative for difficulty urinating, frequency and vaginal pain.  Musculoskeletal: Negative for back pain and gait problem.  Skin: Negative for pallor and rash.  Neurological: Negative for dizziness, tremors, weakness, numbness and headaches.  Psychiatric/Behavioral: Negative for confusion and sleep disturbance.    Objective:  BP (!) 150/98 (BP Location: Left Arm, Patient Position: Sitting, Cuff Size: Normal)   Pulse 71   Temp 97.6 F (36.4 C)   Ht 5\' 8"  (1.727 m)   Wt 148 lb (67.1 kg)   SpO2  98%   BMI 22.50 kg/m   BP Readings from Last 3 Encounters:  07/07/19 (!) 150/98  06/25/19 (!) 150/84  06/09/19 (!) 156/78    Wt Readings from Last 3 Encounters:  07/07/19 148 lb (67.1 kg)  06/25/19 148 lb 9.6 oz (67.4 kg)  06/09/19 147 lb (66.7 kg)    Physical Exam Constitutional:      General: She is not in acute distress.    Appearance: She is well-developed.  HENT:     Head: Normocephalic.     Right Ear: External ear normal.     Left Ear: External ear normal.     Nose: Nose normal.  Eyes:     General:        Right eye: No discharge.        Left eye: No discharge.     Conjunctiva/sclera: Conjunctivae  normal.     Pupils: Pupils are equal, round, and reactive to light.  Neck:     Thyroid: No thyromegaly.     Vascular: No JVD.     Trachea: No tracheal deviation.  Cardiovascular:     Rate and Rhythm: Normal rate and regular rhythm.     Heart sounds: Normal heart sounds.  Pulmonary:     Effort: No respiratory distress.     Breath sounds: No stridor. No wheezing.  Abdominal:     General: Bowel sounds are normal. There is no distension.     Palpations: Abdomen is soft. There is no mass.     Tenderness: There is no abdominal tenderness. There is no guarding or rebound.  Musculoskeletal:        General: No tenderness.     Cervical back: Normal range of motion and neck supple.  Lymphadenopathy:     Cervical: No cervical adenopathy.  Skin:    Findings: No erythema or rash.  Neurological:     Cranial Nerves: No cranial nerve deficit.     Motor: No abnormal muscle tone.     Coordination: Coordination normal.     Deep Tendon Reflexes: Reflexes normal.  Psychiatric:        Behavior: Behavior normal.        Thought Content: Thought content normal.        Judgment: Judgment normal.     Lab Results  Component Value Date   WBC 4.8 07/29/2018   HGB 12.4 07/29/2018   HCT 36.9 07/29/2018   PLT 239.0 07/29/2018   GLUCOSE 91 07/29/2018   CHOL 236 (H) 11/02/2016   TRIG 100.0 11/02/2016   HDL 60.60 11/02/2016   LDLDIRECT 188.2 01/10/2012   LDLCALC 155 (H) 11/02/2016   ALT 14 07/29/2018   AST 19 07/29/2018   NA 139 07/29/2018   K 3.8 07/29/2018   CL 103 07/29/2018   CREATININE 1.11 07/29/2018   BUN 14 07/29/2018   CO2 26 07/29/2018   TSH 0.15 (L) 07/17/2018   INR 1.0 08/01/2018    CT RENAL STONE STUDY  Result Date: 08/01/2018 CLINICAL DATA:  Hematuria, loose stools EXAM: CT ABDOMEN AND PELVIS WITHOUT CONTRAST TECHNIQUE: Multidetector CT imaging of the abdomen and pelvis was performed following the standard protocol without IV contrast. COMPARISON:  12/24/2014 FINDINGS: Lower  chest: No acute abnormality. Coronary artery calcifications. Hepatobiliary: No focal liver abnormality is seen. No gallstones, gallbladder wall thickening, or biliary dilatation. Pancreas: Unremarkable. No pancreatic ductal dilatation or surrounding inflammatory changes. Spleen: Normal in size without focal abnormality. Adrenals/Urinary Tract: Adrenal glands are unremarkable. Kidneys are normal, without renal  calculi, focal lesion, or hydronephrosis. Multiple small calculi in the dependent urinary bladder as seen on prior examination. Stomach/Bowel: Stomach is within normal limits. Appendix appears normal. No evidence of bowel wall thickening, distention, or inflammatory changes. Vascular/Lymphatic: Calcific atherosclerosis. No enlarged abdominal or pelvic lymph nodes. Reproductive: No mass or other abnormality. Status post hysterectomy. Other: No abdominal wall hernia or abnormality. No abdominopelvic ascites. Musculoskeletal: No acute or significant osseous findings. IMPRESSION: 1. No acute noncontrast CT findings of the abdomen or pelvis to explain hematuria or loose stools. 2. Multiple small calculi within the dependent urinary bladder, similar to prior study. Electronically Signed   By: Eddie Candle M.D.   On: 08/01/2018 15:42    Assessment & Plan:      Walker Kehr, MD

## 2019-07-13 ENCOUNTER — Encounter: Payer: Self-pay | Admitting: Family

## 2019-07-13 ENCOUNTER — Ambulatory Visit (INDEPENDENT_AMBULATORY_CARE_PROVIDER_SITE_OTHER): Payer: Medicare Other | Admitting: Family

## 2019-07-13 ENCOUNTER — Other Ambulatory Visit: Payer: Self-pay

## 2019-07-13 VITALS — BP 146/78 | HR 67 | Temp 97.9°F | Ht 68.0 in

## 2019-07-13 DIAGNOSIS — M109 Gout, unspecified: Secondary | ICD-10-CM | POA: Diagnosis not present

## 2019-07-13 MED ORDER — METHYLPREDNISOLONE ACETATE 40 MG/ML IJ SUSP
40.0000 mg | Freq: Once | INTRAMUSCULAR | Status: AC
  Administered 2019-07-13: 40 mg via INTRAMUSCULAR

## 2019-07-13 NOTE — Addendum Note (Signed)
Addended by: Karma Ganja on: 07/13/2019 01:45 PM   Modules accepted: Orders

## 2019-07-13 NOTE — Patient Instructions (Signed)
 Gout  Gout is painful swelling of your joints. Gout is a type of arthritis. It is caused by having too much uric acid in your body. Uric acid is a chemical that is made when your body breaks down substances called purines. If your body has too much uric acid, sharp crystals can form and build up in your joints. This causes pain and swelling. Gout attacks can happen quickly and be very painful (acute gout). Over time, the attacks can affect more joints and happen more often (chronic gout). What are the causes?  Too much uric acid in your blood. This can happen because: ? Your kidneys do not remove enough uric acid from your blood. ? Your body makes too much uric acid. ? You eat too many foods that are high in purines. These foods include organ meats, some seafood, and beer.  Trauma or stress. What increases the risk?  Having a family history of gout.  Being female and middle-aged.  Being female and having gone through menopause.  Being very overweight (obese).  Drinking alcohol, especially beer.  Not having enough water in the body (being dehydrated).  Losing weight too quickly.  Having an organ transplant.  Having lead poisoning.  Taking certain medicines.  Having kidney disease.  Having a skin condition called psoriasis. What are the signs or symptoms? An attack of acute gout usually happens in just one joint. The most common place is the big toe. Attacks often start at night. Other joints that may be affected include joints of the feet, ankle, knee, fingers, wrist, or elbow. Symptoms of an attack may include:  Very bad pain.  Warmth.  Swelling.  Stiffness.  Shiny, red, or purple skin.  Tenderness. The affected joint may be very painful to touch.  Chills and fever. Chronic gout may cause symptoms more often. More joints may be involved. You may also have white or yellow lumps (tophi) on your hands or feet or in other areas near your joints. How is this  treated?  Treatment for this condition has two phases: treating an acute attack and preventing future attacks.  Acute gout treatment may include: ? NSAIDs. ? Steroids. These are taken by mouth or injected into a joint. ? Colchicine. This medicine relieves pain and swelling. It can be given by mouth or through an IV tube.  Preventive treatment may include: ? Taking small doses of NSAIDs or colchicine daily. ? Using a medicine that reduces uric acid levels in your blood. ? Making changes to your diet. You may need to see a food expert (dietitian) about what to eat and drink to prevent gout. Follow these instructions at home: During a gout attack   If told, put ice on the painful area: ? Put ice in a plastic bag. ? Place a towel between your skin and the bag. ? Leave the ice on for 20 minutes, 2-3 times a day.  Raise (elevate) the painful joint above the level of your heart as often as you can.  Rest the joint as much as possible. If the joint is in your leg, you may be given crutches.  Follow instructions from your doctor about what you cannot eat or drink. Avoiding future gout attacks  Eat a low-purine diet. Avoid foods and drinks such as: ? Liver. ? Kidney. ? Anchovies. ? Asparagus. ? Herring. ? Mushrooms. ? Mussels. ? Beer.  Stay at a healthy weight. If you want to lose weight, talk with your doctor. Do not lose   weight too fast.  Start or continue an exercise plan as told by your doctor. Eating and drinking  Drink enough fluids to keep your pee (urine) pale yellow.  If you drink alcohol: ? Limit how much you use to:  0-1 drink a day for women.  0-2 drinks a day for men. ? Be aware of how much alcohol is in your drink. In the [place] one drink equals one 12 oz bottle of beer (355 mL), one 5 oz glass of wine (148 mL), or one 1 oz glass of hard liquor (44 mL). General instructions  Take over-the-counter and prescription medicines only as told by your doctor.  Do  not drive or use heavy machinery while taking prescription pain medicine.  Return to your normal activities as told by your doctor. Ask your doctor what activities are safe for you.  Keep all follow-up visits as told by your doctor. This is important. Contact a doctor if:  You have another gout attack.  You still have symptoms of a gout attack after 10 days of treatment.  You have problems (side effects) because of your medicines.  You have chills or a fever.  You have burning pain when you pee (urinate).  You have pain in your lower back or belly. Get help right away if:  You have very bad pain.  Your pain cannot be controlled.  You cannot pee. Summary  Gout is painful swelling of the joints.  The most common site of pain is the big toe, but it can affect other joints.  Medicines and avoiding some foods can help to prevent and treat gout attacks. This information is not intended to replace advice given to you by your health care provider. Make sure you discuss any questions you have with your health care provider. Document Revised: 11/27/2017 Document Reviewed: 11/27/2017 Elsevier Patient Education  2020 [employer]  

## 2019-07-13 NOTE — Progress Notes (Signed)
[name] is a 74 y.o. female with the following history as recorded in Epic[employer]:  Patient Active Problem List   Diagnosis Date Noted  . Bladder prolapse, female, acquired 06/25/2019  . Statin myopathy 04/18/2019  . Dog bite of arm 02/15/2019  . Right arm pain 02/06/2019  . Atrophic vaginitis 10/20/2018  . Urinary bladder stone 08/06/2018  . Hematuria, gross 08/06/2018  . Edema 04/21/2018  . Herpes zoster 02/20/2018  . Occipital neuralgia of left side 01/07/2018  . Hormone replacement therapy (HRT) 07/25/2017  . Hair loss 05/31/2017  . Upper respiratory infection 05/01/2017  . Achilles tendinitis 03/12/2017  . Gout 01/09/2017  . Shoulder effusion, right 12/28/2016  . Neck stiffness 12/28/2016  . Heel pain, chronic, left 12/11/2016  . Calcaneal bursitis (heel), left 11/13/2016  . Toenail avulsion, initial encounter 11/13/2016  . Traumatic hematoma of foot, right, sequela 09/27/2016  . Adnexal mass 01/04/2016  . Right adrenal mass (HCC) 12/29/2015  . Cataract 10/10/2015  . Abnormal abdominal x-ray 12/23/2014  . GERD (gastroesophageal reflux disease) 11/07/2012  . Hot flash, menopausal 09/10/2012  . Well adult exam 06/13/2011  . GAIT DISTURBANCE 11/22/2009  . PARESTHESIA 11/22/2009  . BACK PAIN, LUMBAR 11/03/2009  . Hypothyroidism 02/11/2007  . Dyslipidemia 02/11/2007  . Chronic anxiety 02/11/2007  . Depression 02/11/2007  . Essential hypertension 02/11/2007  . Coronary atherosclerosis 02/11/2007  . 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 CAROTID BRUIT 02/11/2007    Current Outpatient Medications  Medication Sig Dispense Refill  . aspirin 81 MG EC tablet Take 81 mg by mouth daily.      . Cholecalciferol 1000 UNITS tablet Take 1,000 Units by mouth daily.      . clobetasol ointment (TEMOVATE) 0.05 % Apply 1 application topically 2 (two) times daily. Apply as directed twice daily for up to 2 weeks, then space out to 1-2 x a week. Not for daily long term use. 30 g 0  .  clorazepate (TRANXENE) 15 MG tablet Take 1 tablet (15 mg total) by mouth 3 (three) times daily as needed. for anxiety 270 tablet 1  . doxepin (SINEQUAN) 50 MG capsule Take 3 capsules (150 mg total) by mouth at bedtime. 270 capsule 3  . estradiol cypionate (DEPO-ESTRADIOL) 5 MG/ML injection Use 0.3 ml every 18-25 days IM 5 mL 3  . furosemide (LASIX) 40 MG tablet Take 1 tablet (40 mg total) by mouth 2 (two) times daily. 180 tablet 3  . levothyroxine (SYNTHROID) 137 MCG tablet TAKE 1 TABLET BY MOUTH ONCE DAILY BEFORE BREAKFAST 90 tablet 3  . LORazepam (ATIVAN) 0.5 MG tablet TAKE 1 TABLET BY MOUTH AT BEDTIME 90 tablet 1  . methylPREDNISolone (MEDROL DOSEPAK) 4 MG TBPK tablet As directed 21 tablet 0  . metoprolol tartrate (LOPRESSOR) 50 MG tablet Take 1 tablet (50 mg total) by mouth 3 (three) times daily. 270 tablet 3  . mupirocin ointment (BACTROBAN) 2 % On leg wound w/dressing change qd or bid 22 g 0  . NIFEdipine (PROCARDIA-XL/NIFEDICAL-XL) 30 MG 24 hr tablet Take 1 tablet (30 mg total) by mouth daily. 90 tablet 3  . nitroGLYCERIN (NITROLINGUAL) 0.4 MG/SPRAY spray PLACE 1 SPRAY UNDER THE TONGUE EVERY 5 MINUTES AS NEEDED FOR CHEST PAIN. MAY REPEAT 3 TIMES 12 g 1  . potassium chloride SA (KLOR-CON M20) 20 MEQ tablet Take 1 tablet (20 mEq total) by mouth 2 (two) times daily. 180 tablet 3  . sulfamethoxazole-trimethoprim (BACTRIM DS) 800-160 MG tablet Take 1 tablet by mouth 2 (two) times  daily. 6 tablet 0   Current Facility-Administered Medications  Medication Dose Route Frequency Provider Last Rate Last Admin  . estradiol cypionate (DEPO-ESTRADIOL) 5 MG/ML injection 1.5 mg  1.5 mg Intramuscular Q30 days Plotnikov, Evie Lacks, MD   1.5 mg at 07/07/19 1540    Allergies: Epinephrine, Isosorbide mononitrate, Lidocaine, Morphine, Propoxyphene n-acetaminophen, Alprazolam, Atorvastatin, Codeine, Ezetimibe, Prilosec [omeprazole], Rosuvastatin, Simvastatin, Allopurinol, Meloxicam, Tetracycline, Triple antibiotic  [bacitracin-neomycin-polymyxin], Amoxicillin, Ciprofloxacin, and Diphenhydramine hcl  Past Medical History:  Diagnosis Date  . Anxiety state, unspecified   . Bronchitis, acute   . Carotid bruit   . Coronary atherosclerosis of unspecified type of vessel, native or graft    multiple angioplasy procedures in 90's/early 2000's  . De Quervain's tenosynovitis   . Depressive disorder, not elsewhere classified   . Female bladder prolapse   . Lichen 2025   Vaginal  . Other and unspecified hyperlipidemia   . Unspecified essential hypertension   . Unspecified hypothyroidism     Past Surgical History:  Procedure Laterality Date  . MOUTH SURGERY    . NOSE SURGERY  1999  . TOTAL VAGINAL HYSTERECTOMY  1994    Family History  Problem Relation Age of Onset  . Hypertension Mother   . Hypertension Father   . Hypertension Other   . Coronary artery disease Other     Social History   Tobacco Use  . Smoking status: Former Smoker    Quit date: 07/22/1991    Years since quitting: 27.9  . Smokeless tobacco: Never Used  Substance Use Topics  . Alcohol use: No    Subjective:   Patient had a gout flare which started on Saturday; notes that pain was in her left foot- woke her up and could not stand for the sheet to touch her foot; Yesterday, she treated with ice and elevation and has gotten some relief;  Requesting cortisone injection; has not been able to tolerate allopurinol in the past; Has been eating increased amounts of shrimp in the past week;     Objective:  Vitals:   07/13/19 1135  BP: (!) 146/78  Pulse: 67  Temp: 97.9 F (36.6 C)  TempSrc: Oral  SpO2: 97%  Height: 5\' 8"  (1.727 m)    General: Well developed, well nourished, in no acute distress  Skin : Warm and dry.  Head: Normocephalic and atraumatic  Lungs: Respirations unlabored;  Musculoskeletal: No deformities; no active joint inflammation  Extremities: No edema, cyanosis, clubbing  Vessels: Symmetric bilaterally   Neurologic: Alert and oriented; speech intact; face symmetrical; moves all extremities well; CNII-XII intact without focal deficit   Assessment:  1. Gout of left foot, unspecified cause, unspecified chronicity     Plan:  Depo-Medrol IM 40 mg; discussed dietary changes to help manage the symptoms including limiting seafood, trial of cherry juice;  This visit occurred during the SARS-CoV-2 public health emergency.  Safety protocols were in place, including screening questions prior to the visit, additional usage of staff PPE, and extensive cleaning of exam room while observing appropriate contact time as indicated for disinfecting solutions.     No follow-ups on file.  No orders of the defined types were placed in this encounter.   Requested Prescriptions    No prescriptions requested or ordered in this encounter

## 2019-07-30 ENCOUNTER — Other Ambulatory Visit: Payer: Self-pay | Admitting: Obstetrics and Gynecology

## 2019-07-30 DIAGNOSIS — L900 Lichen sclerosus et atrophicus: Secondary | ICD-10-CM

## 2019-07-30 NOTE — Telephone Encounter (Signed)
Med refill request: Clobetasol Last OV: 12/29/2018 Next AEX: not scheduled Last MMG (if hormonal med) 05/12/18, Birads 1, Negative Refill authorized: #30 g, 1 RF. Pended if approved

## 2019-07-30 NOTE — Telephone Encounter (Signed)
Patient has decided to get her refill from [facility]. She said to disregard the request.

## 2019-07-30 NOTE — Telephone Encounter (Signed)
Patient is requesting a refill of clobetasol for two tubes to be sent to [facility] [place].

## 2019-08-06 ENCOUNTER — Ambulatory Visit: Payer: Medicare Other | Admitting: Internal Medicine

## 2019-08-11 ENCOUNTER — Encounter: Payer: Self-pay | Admitting: Certified Nurse Midwife

## 2019-08-12 ENCOUNTER — Encounter: Payer: Self-pay | Admitting: Internal Medicine

## 2019-08-12 ENCOUNTER — Ambulatory Visit (INDEPENDENT_AMBULATORY_CARE_PROVIDER_SITE_OTHER): Payer: Medicare Other | Admitting: Internal Medicine

## 2019-08-12 ENCOUNTER — Other Ambulatory Visit: Payer: Self-pay

## 2019-08-12 DIAGNOSIS — E785 Hyperlipidemia, unspecified: Secondary | ICD-10-CM

## 2019-08-12 DIAGNOSIS — N951 Menopausal and female climacteric states: Secondary | ICD-10-CM | POA: Diagnosis not present

## 2019-08-12 DIAGNOSIS — Z79890 Hormone replacement therapy (postmenopausal): Secondary | ICD-10-CM

## 2019-08-12 DIAGNOSIS — I10 Essential (primary) hypertension: Secondary | ICD-10-CM

## 2019-08-12 DIAGNOSIS — I2510 Atherosclerotic heart disease of native coronary artery without angina pectoris: Secondary | ICD-10-CM | POA: Diagnosis not present

## 2019-08-12 NOTE — Progress Notes (Signed)
Subjective:  Patient ID: [name], female    DOB: [birth year]  Age: 74 y.o. MRN: [id number]  CC: No chief complaint on file.   HPI [name] presents for hot flashes, hypothyroidism, HTN   Outpatient Medications Prior to Visit  Medication Sig Dispense Refill  . aspirin 81 MG EC tablet Take 81 mg by mouth daily.      . Cholecalciferol 1000 UNITS tablet Take 1,000 Units by mouth daily.      . clobetasol ointment (TEMOVATE) 0.05 % Apply 1 application topically 2 (two) times daily. Apply as directed twice daily for up to 2 weeks, then space out to 1-2 x a week. Not for daily long term use. 30 g 0  . clorazepate (TRANXENE) 15 MG tablet Take 1 tablet (15 mg total) by mouth 3 (three) times daily as needed. for anxiety 270 tablet 1  . doxepin (SINEQUAN) 50 MG capsule Take 3 capsules (150 mg total) by mouth at bedtime. 270 capsule 3  . estradiol cypionate (DEPO-ESTRADIOL) 5 MG/ML injection Use 0.3 ml every 18-25 days IM 5 mL 3  . furosemide (LASIX) 40 MG tablet Take 1 tablet (40 mg total) by mouth 2 (two) times daily. 180 tablet 3  . levothyroxine (SYNTHROID) 137 MCG tablet TAKE 1 TABLET BY MOUTH ONCE DAILY BEFORE BREAKFAST 90 tablet 3  . LORazepam (ATIVAN) 0.5 MG tablet TAKE 1 TABLET BY MOUTH AT BEDTIME 90 tablet 1  . methylPREDNISolone (MEDROL DOSEPAK) 4 MG TBPK tablet As directed 21 tablet 0  . metoprolol tartrate (LOPRESSOR) 50 MG tablet Take 1 tablet (50 mg total) by mouth 3 (three) times daily. 270 tablet 3  . mupirocin ointment (BACTROBAN) 2 % On leg wound w/dressing change qd or bid 22 g 0  . NIFEdipine (PROCARDIA-XL/NIFEDICAL-XL) 30 MG 24 hr tablet Take 1 tablet (30 mg total) by mouth daily. 90 tablet 3  . nitroGLYCERIN (NITROLINGUAL) 0.4 MG/SPRAY spray PLACE 1 SPRAY UNDER THE TONGUE EVERY 5 MINUTES AS NEEDED FOR CHEST PAIN. MAY REPEAT 3 TIMES 12 g 1  . potassium chloride SA (KLOR-CON M20) 20 MEQ tablet Take 1 tablet (20 mEq total) by mouth 2 (two) times daily. 180 tablet 3  .  sulfamethoxazole-trimethoprim (BACTRIM DS) 800-160 MG tablet Take 1 tablet by mouth 2 (two) times daily. 6 tablet 0   Facility-Administered Medications Prior to Visit  Medication Dose Route Frequency Provider Last Rate Last Admin  . estradiol cypionate (DEPO-ESTRADIOL) 5 MG/ML injection 1.5 mg  1.5 mg Intramuscular Q30 days Mollee Neer, Georgina Quint, MD   1.5 mg at 07/07/19 1540    ROS: Review of Systems  Constitutional: Negative for activity change, appetite change, chills, fatigue and unexpected weight change.  HENT: Negative for congestion, mouth sores and sinus pressure.   Eyes: Negative for visual disturbance.  Respiratory: Negative for cough and chest tightness.   Gastrointestinal: Negative for abdominal pain and nausea.  Genitourinary: Negative for difficulty urinating, frequency and vaginal pain.  Musculoskeletal: Positive for arthralgias and back pain. Negative for gait problem.  Skin: Negative for pallor and rash.  Neurological: Negative for dizziness, tremors, weakness, numbness and headaches.  Psychiatric/Behavioral: Negative for confusion and sleep disturbance. The patient is nervous/anxious.     Objective:  BP (!) 180/96 (BP Location: Left Arm, Patient Position: Sitting, Cuff Size: Normal)   Pulse 62   Ht 5\' 8"  (1.727 m)   Wt 148 lb 2 oz (67.2 kg)   SpO2 99%   BMI 22.52 kg/m   BP Readings  from Last 3 Encounters:  08/12/19 (!) 180/96  07/13/19 (!) 146/78  07/07/19 (!) 150/98    Wt Readings from Last 3 Encounters:  08/12/19 148 lb 2 oz (67.2 kg)  07/07/19 148 lb (67.1 kg)  06/25/19 148 lb 9.6 oz (67.4 kg)    Physical Exam Constitutional:      General: She is not in acute distress.    Appearance: She is well-developed.  HENT:     Head: Normocephalic.     Right Ear: External ear normal.     Left Ear: External ear normal.     Nose: Nose normal.  Eyes:     General:        Right eye: No discharge.        Left eye: No discharge.     Conjunctiva/sclera:  Conjunctivae normal.     Pupils: Pupils are equal, round, and reactive to light.  Neck:     Thyroid: No thyromegaly.     Vascular: No JVD.     Trachea: No tracheal deviation.  Cardiovascular:     Rate and Rhythm: Normal rate and regular rhythm.     Heart sounds: Normal heart sounds.  Pulmonary:     Effort: No respiratory distress.     Breath sounds: No stridor. No wheezing.  Abdominal:     General: Bowel sounds are normal. There is no distension.     Palpations: Abdomen is soft. There is no mass.     Tenderness: There is no abdominal tenderness. There is no guarding or rebound.  Musculoskeletal:        General: No tenderness.     Cervical back: Normal range of motion and neck supple.  Lymphadenopathy:     Cervical: No cervical adenopathy.  Skin:    Findings: No erythema or rash.  Neurological:     Cranial Nerves: No cranial nerve deficit.     Motor: No abnormal muscle tone.     Coordination: Coordination normal.     Deep Tendon Reflexes: Reflexes normal.  Psychiatric:        Behavior: Behavior normal.        Thought Content: Thought content normal.        Judgment: Judgment normal.     Lab Results  Component Value Date   WBC 4.8 07/29/2018   HGB 12.4 07/29/2018   HCT 36.9 07/29/2018   PLT 239.0 07/29/2018   GLUCOSE 91 07/29/2018   CHOL 236 (H) 11/02/2016   TRIG 100.0 11/02/2016   HDL 60.60 11/02/2016   LDLDIRECT 188.2 01/10/2012   LDLCALC 155 (H) 11/02/2016   ALT 14 07/29/2018   AST 19 07/29/2018   NA 139 07/29/2018   K 3.8 07/29/2018   CL 103 07/29/2018   CREATININE 1.11 07/29/2018   BUN 14 07/29/2018   CO2 26 07/29/2018   TSH 0.15 (L) 07/17/2018   INR 1.0 08/01/2018    CT RENAL STONE STUDY  Result Date: 08/01/2018 CLINICAL DATA:  Hematuria, loose stools EXAM: CT ABDOMEN AND PELVIS WITHOUT CONTRAST TECHNIQUE: Multidetector CT imaging of the abdomen and pelvis was performed following the standard protocol without IV contrast. COMPARISON:  12/24/2014  FINDINGS: Lower chest: No acute abnormality. Coronary artery calcifications. Hepatobiliary: No focal liver abnormality is seen. No gallstones, gallbladder wall thickening, or biliary dilatation. Pancreas: Unremarkable. No pancreatic ductal dilatation or surrounding inflammatory changes. Spleen: Normal in size without focal abnormality. Adrenals/Urinary Tract: Adrenal glands are unremarkable. Kidneys are normal, without renal calculi, focal lesion, or hydronephrosis. Multiple small calculi  in the dependent urinary bladder as seen on prior examination. Stomach/Bowel: Stomach is within normal limits. Appendix appears normal. No evidence of bowel wall thickening, distention, or inflammatory changes. Vascular/Lymphatic: Calcific atherosclerosis. No enlarged abdominal or pelvic lymph nodes. Reproductive: No mass or other abnormality. Status post hysterectomy. Other: No abdominal wall hernia or abnormality. No abdominopelvic ascites. Musculoskeletal: No acute or significant osseous findings. IMPRESSION: 1. No acute noncontrast CT findings of the abdomen or pelvis to explain hematuria or loose stools. 2. Multiple small calculi within the dependent urinary bladder, similar to prior study. Electronically Signed   By: Eddie Candle M.D.   On: 08/01/2018 15:42    Assessment & Plan:   Walker Kehr, MD

## 2019-08-12 NOTE — Patient Instructions (Signed)
  Noel Christmas, MD Undergraduate Education:  [employer]  Specialties/ Area of Interest:  General Urology Stone Disease Benign Prostatic Hyperplasia (BPH) Urinary Incontinence Vasectomy Penile Prosthesis Erectile Dysfunction Bladder cancer  Medical Education:  [employer] at [facility] [employer] [facility]  Residency:  [employer] at [employer]  Certifications:  Biomedical engineer of Urology  Memberships:  American Urological Association (AUA)

## 2019-08-16 ENCOUNTER — Encounter: Payer: Self-pay | Admitting: Internal Medicine

## 2019-08-16 NOTE — Assessment & Plan Note (Signed)
Continue with Toprol and Procardia.  No angina

## 2019-08-16 NOTE — Assessment & Plan Note (Signed)
Statin intolerant 

## 2019-08-16 NOTE — Assessment & Plan Note (Signed)
Blood pressure continues to be normal at home.  On Toprol, nifedipine

## 2019-08-16 NOTE — Assessment & Plan Note (Signed)
Refractory hot flashes.  The patient wants to continue with her monthly injections.   she is aware of potential risks and benefits

## 2019-09-09 ENCOUNTER — Ambulatory Visit (INDEPENDENT_AMBULATORY_CARE_PROVIDER_SITE_OTHER): Payer: Medicare Other | Admitting: Internal Medicine

## 2019-09-09 ENCOUNTER — Encounter: Payer: Self-pay | Admitting: Internal Medicine

## 2019-09-09 ENCOUNTER — Other Ambulatory Visit: Payer: Self-pay

## 2019-09-09 VITALS — BP 126/82 | HR 65 | Temp 97.9°F | Ht 68.0 in | Wt 148.0 lb

## 2019-09-09 DIAGNOSIS — I10 Essential (primary) hypertension: Secondary | ICD-10-CM | POA: Diagnosis not present

## 2019-09-09 DIAGNOSIS — I2510 Atherosclerotic heart disease of native coronary artery without angina pectoris: Secondary | ICD-10-CM | POA: Diagnosis not present

## 2019-09-09 DIAGNOSIS — E785 Hyperlipidemia, unspecified: Secondary | ICD-10-CM

## 2019-09-09 DIAGNOSIS — Z79890 Hormone replacement therapy (postmenopausal): Secondary | ICD-10-CM

## 2019-09-09 DIAGNOSIS — N951 Menopausal and female climacteric states: Secondary | ICD-10-CM

## 2019-09-09 DIAGNOSIS — E034 Atrophy of thyroid (acquired): Secondary | ICD-10-CM

## 2019-09-09 NOTE — Assessment & Plan Note (Signed)
Statin intolerant 

## 2019-09-09 NOTE — Assessment & Plan Note (Signed)
Given injection

## 2019-09-09 NOTE — Assessment & Plan Note (Signed)
Levothroid 

## 2019-09-09 NOTE — Assessment & Plan Note (Signed)
Toprol, Nifedipine 

## 2019-09-09 NOTE — Progress Notes (Signed)
Subjective:  Patient ID: [name], female    DOB: 06/16/1945  Age: 74 y.o. MRN: [id number]  CC: No chief complaint on file.   HPI [name] presents for HTN, CAD, menopausal sweats f/u  Outpatient Medications Prior to Visit  Medication Sig Dispense Refill  . aspirin 81 MG EC tablet Take 81 mg by mouth daily.      . Cholecalciferol 1000 UNITS tablet Take 1,000 Units by mouth daily.      . clobetasol ointment (TEMOVATE) 8.18 % Apply 1 application topically 2 (two) times daily. Apply as directed twice daily for up to 2 weeks, then space out to 1-2 x a week. Not for daily long term use. 30 g 0  . clorazepate (TRANXENE) 15 MG tablet Take 1 tablet (15 mg total) by mouth 3 (three) times daily as needed. for anxiety 270 tablet 1  . doxepin (SINEQUAN) 50 MG capsule Take 3 capsules (150 mg total) by mouth at bedtime. 270 capsule 3  . estradiol (ESTRACE) 0.1 MG/GM vaginal cream Place 1 Applicatorful vaginally at bedtime as needed.    Marland Kitchen estradiol cypionate (DEPO-ESTRADIOL) 5 MG/ML injection Use 0.3 ml every 18-25 days IM 5 mL 3  . furosemide (LASIX) 40 MG tablet Take 1 tablet (40 mg total) by mouth 2 (two) times daily. 180 tablet 3  . levothyroxine (SYNTHROID) 137 MCG tablet TAKE 1 TABLET BY MOUTH ONCE DAILY BEFORE BREAKFAST 90 tablet 3  . LORazepam (ATIVAN) 0.5 MG tablet TAKE 1 TABLET BY MOUTH AT BEDTIME 90 tablet 1  . metoprolol tartrate (LOPRESSOR) 50 MG tablet Take 1 tablet (50 mg total) by mouth 3 (three) times daily. 270 tablet 3  . NIFEdipine (PROCARDIA-XL/NIFEDICAL-XL) 30 MG 24 hr tablet Take 1 tablet (30 mg total) by mouth daily. 90 tablet 3  . nitroGLYCERIN (NITROLINGUAL) 0.4 MG/SPRAY spray PLACE 1 SPRAY UNDER THE TONGUE EVERY 5 MINUTES AS NEEDED FOR CHEST PAIN. MAY REPEAT 3 TIMES 12 g 1  . potassium chloride SA (KLOR-CON M20) 20 MEQ tablet Take 1 tablet (20 mEq total) by mouth 2 (two) times daily. 180 tablet 3  . methylPREDNISolone (MEDROL DOSEPAK) 4 MG TBPK tablet As directed 21  tablet 0  . mupirocin ointment (BACTROBAN) 2 % On leg wound w/dressing change qd or bid (Patient not taking: Reported on 09/09/2019) 22 g 0  . sulfamethoxazole-trimethoprim (BACTRIM DS) 800-160 MG tablet Take 1 tablet by mouth 2 (two) times daily. 6 tablet 0   Facility-Administered Medications Prior to Visit  Medication Dose Route Frequency Provider Last Rate Last Admin  . estradiol cypionate (DEPO-ESTRADIOL) 5 MG/ML injection 1.5 mg  1.5 mg Intramuscular Q30 days Jariel Drost, Evie Lacks, MD   1.5 mg at 08/12/19 1647    ROS: Review of Systems  Constitutional: Negative for activity change, appetite change, chills, fatigue and unexpected weight change.  HENT: Negative for congestion, mouth sores and sinus pressure.   Eyes: Negative for visual disturbance.  Respiratory: Negative for cough and chest tightness.   Gastrointestinal: Negative for abdominal pain and nausea.  Genitourinary: Negative for difficulty urinating, frequency and vaginal pain.  Musculoskeletal: Positive for arthralgias. Negative for back pain and gait problem.  Skin: Negative for pallor and rash.  Neurological: Positive for headaches. Negative for dizziness, tremors, weakness and numbness.  Psychiatric/Behavioral: Negative for confusion and sleep disturbance.    Objective:  BP 126/82 (BP Location: Left Arm, Patient Position: Sitting, Cuff Size: Normal)   Pulse 65   Temp 97.9 F (36.6 C) (Oral)  Ht 5\' 8"  (1.727 m)   Wt 148 lb (67.1 kg)   SpO2 98%   BMI 22.50 kg/m   BP Readings from Last 3 Encounters:  09/09/19 126/82  08/12/19 (!) 180/96  07/13/19 (!) 146/78    Wt Readings from Last 3 Encounters:  09/09/19 148 lb (67.1 kg)  08/12/19 148 lb 2 oz (67.2 kg)  07/07/19 148 lb (67.1 kg)    Physical Exam Constitutional:      General: She is not in acute distress.    Appearance: She is well-developed.  HENT:     Head: Normocephalic.     Right Ear: External ear normal.     Left Ear: External ear normal.      Nose: Nose normal.  Eyes:     General:        Right eye: No discharge.        Left eye: No discharge.     Conjunctiva/sclera: Conjunctivae normal.     Pupils: Pupils are equal, round, and reactive to light.  Neck:     Thyroid: No thyromegaly.     Vascular: No JVD.     Trachea: No tracheal deviation.  Cardiovascular:     Rate and Rhythm: Normal rate and regular rhythm.     Heart sounds: Normal heart sounds.  Pulmonary:     Effort: No respiratory distress.     Breath sounds: No stridor. No wheezing.  Abdominal:     General: Bowel sounds are normal. There is no distension.     Palpations: Abdomen is soft. There is no mass.     Tenderness: There is no abdominal tenderness. There is no guarding or rebound.  Musculoskeletal:        General: No tenderness.     Cervical back: Normal range of motion and neck supple.  Lymphadenopathy:     Cervical: No cervical adenopathy.  Skin:    Findings: No erythema or rash.  Neurological:     Mental Status: She is oriented to person, place, and time.     Cranial Nerves: No cranial nerve deficit.     Motor: No abnormal muscle tone.     Coordination: Coordination normal.     Gait: Gait normal.     Deep Tendon Reflexes: Reflexes normal.  Psychiatric:        Behavior: Behavior normal.        Thought Content: Thought content normal.        Judgment: Judgment normal.    Feet NT  Lab Results  Component Value Date   WBC 4.8 07/29/2018   HGB 12.4 07/29/2018   HCT 36.9 07/29/2018   PLT 239.0 07/29/2018   GLUCOSE 91 07/29/2018   CHOL 236 (H) 11/02/2016   TRIG 100.0 11/02/2016   HDL 60.60 11/02/2016   LDLDIRECT 188.2 01/10/2012   LDLCALC 155 (H) 11/02/2016   ALT 14 07/29/2018   AST 19 07/29/2018   NA 139 07/29/2018   K 3.8 07/29/2018   CL 103 07/29/2018   CREATININE 1.11 07/29/2018   BUN 14 07/29/2018   CO2 26 07/29/2018   TSH 0.15 (L) 07/17/2018   INR 1.0 08/01/2018    CT RENAL STONE STUDY  Result Date: 08/01/2018 CLINICAL DATA:   Hematuria, loose stools EXAM: CT ABDOMEN AND PELVIS WITHOUT CONTRAST TECHNIQUE: Multidetector CT imaging of the abdomen and pelvis was performed following the standard protocol without IV contrast. COMPARISON:  12/24/2014 FINDINGS: Lower chest: No acute abnormality. Coronary artery calcifications. Hepatobiliary: No focal liver abnormality is  seen. No gallstones, gallbladder wall thickening, or biliary dilatation. Pancreas: Unremarkable. No pancreatic ductal dilatation or surrounding inflammatory changes. Spleen: Normal in size without focal abnormality. Adrenals/Urinary Tract: Adrenal glands are unremarkable. Kidneys are normal, without renal calculi, focal lesion, or hydronephrosis. Multiple small calculi in the dependent urinary bladder as seen on prior examination. Stomach/Bowel: Stomach is within normal limits. Appendix appears normal. No evidence of bowel wall thickening, distention, or inflammatory changes. Vascular/Lymphatic: Calcific atherosclerosis. No enlarged abdominal or pelvic lymph nodes. Reproductive: No mass or other abnormality. Status post hysterectomy. Other: No abdominal wall hernia or abnormality. No abdominopelvic ascites. Musculoskeletal: No acute or significant osseous findings. IMPRESSION: 1. No acute noncontrast CT findings of the abdomen or pelvis to explain hematuria or loose stools. 2. Multiple small calculi within the dependent urinary bladder, similar to prior study. Electronically Signed   By: Lauralyn Primes M.D.   On: 08/01/2018 15:42    Assessment & Plan:    Sonda Primes, MD

## 2019-10-05 ENCOUNTER — Ambulatory Visit: Payer: Medicare Other

## 2019-10-07 ENCOUNTER — Encounter: Payer: Self-pay | Admitting: Internal Medicine

## 2019-10-07 ENCOUNTER — Ambulatory Visit (INDEPENDENT_AMBULATORY_CARE_PROVIDER_SITE_OTHER): Payer: Medicare Other | Admitting: Internal Medicine

## 2019-10-07 ENCOUNTER — Other Ambulatory Visit: Payer: Self-pay

## 2019-10-07 DIAGNOSIS — M10072 Idiopathic gout, left ankle and foot: Secondary | ICD-10-CM

## 2019-10-07 DIAGNOSIS — I10 Essential (primary) hypertension: Secondary | ICD-10-CM | POA: Diagnosis not present

## 2019-10-07 DIAGNOSIS — Z79890 Hormone replacement therapy (postmenopausal): Secondary | ICD-10-CM

## 2019-10-07 DIAGNOSIS — E785 Hyperlipidemia, unspecified: Secondary | ICD-10-CM | POA: Diagnosis not present

## 2019-10-07 DIAGNOSIS — N951 Menopausal and female climacteric states: Secondary | ICD-10-CM | POA: Diagnosis not present

## 2019-10-07 DIAGNOSIS — F419 Anxiety disorder, unspecified: Secondary | ICD-10-CM

## 2019-10-07 NOTE — Assessment & Plan Note (Signed)
Toprol, Nifedipine 

## 2019-10-07 NOTE — Assessment & Plan Note (Signed)
Lorazepam prn, Doxepine at hs 

## 2019-10-07 NOTE — Progress Notes (Signed)
Subjective:  Patient ID: [name], female    DOB: [birth year]  Age: 74 y.o. MRN: [id number]  CC: No chief complaint on file.   HPI [name] presents for sweats C/o feet swelling w/o pain due to T/pressure change outside per pt; BP goes up w/pressure change too... F/u CAD, anxiety  Outpatient Medications Prior to Visit  Medication Sig Dispense Refill  . aspirin 81 MG EC tablet Take 81 mg by mouth daily.      . Cholecalciferol 1000 UNITS tablet Take 1,000 Units by mouth daily.      . clobetasol ointment (TEMOVATE) 6.31 % Apply 1 application topically 2 (two) times daily. Apply as directed twice daily for up to 2 weeks, then space out to 1-2 x a week. Not for daily long term use. 30 g 0  . clorazepate (TRANXENE) 15 MG tablet Take 1 tablet (15 mg total) by mouth 3 (three) times daily as needed. for anxiety 270 tablet 1  . doxepin (SINEQUAN) 50 MG capsule Take 3 capsules (150 mg total) by mouth at bedtime. 270 capsule 3  . estradiol (ESTRACE) 0.1 MG/GM vaginal cream Place 1 Applicatorful vaginally at bedtime as needed.    Marland Kitchen estradiol cypionate (DEPO-ESTRADIOL) 5 MG/ML injection Use 0.3 ml every 18-25 days IM 5 mL 3  . furosemide (LASIX) 40 MG tablet Take 1 tablet (40 mg total) by mouth 2 (two) times daily. 180 tablet 3  . levothyroxine (SYNTHROID) 137 MCG tablet TAKE 1 TABLET BY MOUTH ONCE DAILY BEFORE BREAKFAST 90 tablet 3  . LORazepam (ATIVAN) 0.5 MG tablet TAKE 1 TABLET BY MOUTH AT BEDTIME 90 tablet 1  . metoprolol tartrate (LOPRESSOR) 50 MG tablet Take 1 tablet (50 mg total) by mouth 3 (three) times daily. 270 tablet 3  . NIFEdipine (PROCARDIA-XL/NIFEDICAL-XL) 30 MG 24 hr tablet Take 1 tablet (30 mg total) by mouth daily. 90 tablet 3  . nitroGLYCERIN (NITROLINGUAL) 0.4 MG/SPRAY spray PLACE 1 SPRAY UNDER THE TONGUE EVERY 5 MINUTES AS NEEDED FOR CHEST PAIN. MAY REPEAT 3 TIMES 12 g 1  . potassium chloride SA (KLOR-CON M20) 20 MEQ tablet Take 1 tablet (20 mEq total) by mouth 2 (two)  times daily. 180 tablet 3   Facility-Administered Medications Prior to Visit  Medication Dose Route Frequency Provider Last Rate Last Admin  . estradiol cypionate (DEPO-ESTRADIOL) 5 MG/ML injection 1.5 mg  1.5 mg Intramuscular Q30 days Diona Peregoy, Evie Lacks, MD   1.5 mg at 08/12/19 1647    ROS: Review of Systems  Constitutional: Negative for activity change, appetite change, chills, fatigue and unexpected weight change.  HENT: Negative for congestion, mouth sores and sinus pressure.   Eyes: Negative for visual disturbance.  Respiratory: Negative for cough and chest tightness.   Cardiovascular: Positive for leg swelling.  Gastrointestinal: Negative for abdominal pain and nausea.  Genitourinary: Negative for difficulty urinating, frequency and vaginal pain.  Musculoskeletal: Negative for back pain and gait problem.  Skin: Negative for pallor and rash.  Neurological: Negative for dizziness, tremors, weakness, numbness and headaches.  Psychiatric/Behavioral: Negative for confusion and sleep disturbance.    Objective:  BP (!) 188/82 (BP Location: Left Arm, Patient Position: Sitting, Cuff Size: Normal)   Pulse 89   Temp 98.1 F (36.7 C) (Oral)   Ht 5\' 8"  (1.727 m)   Wt 148 lb (67.1 kg)   SpO2 97%   BMI 22.50 kg/m   BP Readings from Last 3 Encounters:  10/07/19 (!) 188/82  09/09/19 126/82  08/12/19 Marland Kitchen)  180/96    Wt Readings from Last 3 Encounters:  10/07/19 148 lb (67.1 kg)  09/09/19 148 lb (67.1 kg)  08/12/19 148 lb 2 oz (67.2 kg)    Physical Exam Constitutional:      General: She is not in acute distress.    Appearance: She is well-developed.  HENT:     Head: Normocephalic.     Right Ear: External ear normal.     Left Ear: External ear normal.     Nose: Nose normal.  Eyes:     General:        Right eye: No discharge.        Left eye: No discharge.     Conjunctiva/sclera: Conjunctivae normal.     Pupils: Pupils are equal, round, and reactive to light.  Neck:      Thyroid: No thyromegaly.     Vascular: No JVD.     Trachea: No tracheal deviation.  Cardiovascular:     Rate and Rhythm: Normal rate and regular rhythm.     Heart sounds: Normal heart sounds.  Pulmonary:     Effort: No respiratory distress.     Breath sounds: No stridor. No wheezing.  Abdominal:     General: Bowel sounds are normal. There is no distension.     Palpations: Abdomen is soft. There is no mass.     Tenderness: There is no abdominal tenderness. There is no guarding or rebound.  Musculoskeletal:        General: No tenderness.     Cervical back: Normal range of motion and neck supple.     Right lower leg: Edema present.     Left lower leg: Edema present.  Lymphadenopathy:     Cervical: No cervical adenopathy.  Skin:    Findings: No erythema or rash.  Neurological:     Cranial Nerves: No cranial nerve deficit.     Motor: No abnormal muscle tone.     Coordination: Coordination normal.     Deep Tendon Reflexes: Reflexes normal.  Psychiatric:        Behavior: Behavior normal.        Thought Content: Thought content normal.        Judgment: Judgment normal.     Lab Results  Component Value Date   WBC 4.8 07/29/2018   HGB 12.4 07/29/2018   HCT 36.9 07/29/2018   PLT 239.0 07/29/2018   GLUCOSE 91 07/29/2018   CHOL 236 (H) 11/02/2016   TRIG 100.0 11/02/2016   HDL 60.60 11/02/2016   LDLDIRECT 188.2 01/10/2012   LDLCALC 155 (H) 11/02/2016   ALT 14 07/29/2018   AST 19 07/29/2018   NA 139 07/29/2018   K 3.8 07/29/2018   CL 103 07/29/2018   CREATININE 1.11 07/29/2018   BUN 14 07/29/2018   CO2 26 07/29/2018   TSH 0.15 (L) 07/17/2018   INR 1.0 08/01/2018    CT RENAL STONE STUDY  Result Date: 08/01/2018 CLINICAL DATA:  Hematuria, loose stools EXAM: CT ABDOMEN AND PELVIS WITHOUT CONTRAST TECHNIQUE: Multidetector CT imaging of the abdomen and pelvis was performed following the standard protocol without IV contrast. COMPARISON:  12/24/2014 FINDINGS: Lower chest: No  acute abnormality. Coronary artery calcifications. Hepatobiliary: No focal liver abnormality is seen. No gallstones, gallbladder wall thickening, or biliary dilatation. Pancreas: Unremarkable. No pancreatic ductal dilatation or surrounding inflammatory changes. Spleen: Normal in size without focal abnormality. Adrenals/Urinary Tract: Adrenal glands are unremarkable. Kidneys are normal, without renal calculi, focal lesion, or hydronephrosis. Multiple small  calculi in the dependent urinary bladder as seen on prior examination. Stomach/Bowel: Stomach is within normal limits. Appendix appears normal. No evidence of bowel wall thickening, distention, or inflammatory changes. Vascular/Lymphatic: Calcific atherosclerosis. No enlarged abdominal or pelvic lymph nodes. Reproductive: No mass or other abnormality. Status post hysterectomy. Other: No abdominal wall hernia or abnormality. No abdominopelvic ascites. Musculoskeletal: No acute or significant osseous findings. IMPRESSION: 1. No acute noncontrast CT findings of the abdomen or pelvis to explain hematuria or loose stools. 2. Multiple small calculi within the dependent urinary bladder, similar to prior study. Electronically Signed   By: Lauralyn Primes M.D.   On: 08/01/2018 15:42    Assessment & Plan:    Follow-up: No follow-ups on file.  Sonda Primes, MD

## 2019-10-07 NOTE — Assessment & Plan Note (Signed)
Statin intolerant 

## 2019-10-07 NOTE — Assessment & Plan Note (Signed)
Depo-Estradiol 0.3 ml inj IM

## 2019-10-07 NOTE — Assessment & Plan Note (Signed)
No relapse lately 

## 2019-10-07 NOTE — Assessment & Plan Note (Signed)
Given injection today.  

## 2019-11-05 ENCOUNTER — Encounter: Payer: Self-pay | Admitting: Internal Medicine

## 2019-11-05 ENCOUNTER — Other Ambulatory Visit: Payer: Self-pay

## 2019-11-05 ENCOUNTER — Ambulatory Visit (INDEPENDENT_AMBULATORY_CARE_PROVIDER_SITE_OTHER): Payer: Medicare Other | Admitting: Internal Medicine

## 2019-11-05 DIAGNOSIS — M10072 Idiopathic gout, left ankle and foot: Secondary | ICD-10-CM

## 2019-11-05 DIAGNOSIS — Z79890 Hormone replacement therapy (postmenopausal): Secondary | ICD-10-CM | POA: Diagnosis not present

## 2019-11-05 DIAGNOSIS — I10 Essential (primary) hypertension: Secondary | ICD-10-CM | POA: Diagnosis not present

## 2019-11-05 MED ORDER — METHYLPREDNISOLONE ACETATE 80 MG/ML IJ SUSP
80.0000 mg | Freq: Once | INTRAMUSCULAR | Status: AC
  Administered 2019-11-05: 80 mg via INTRAMUSCULAR

## 2019-11-05 MED ORDER — ESTRADIOL CYPIONATE 5 MG/ML IM OIL
1.5000 mg | TOPICAL_OIL | Freq: Once | INTRAMUSCULAR | Status: AC
  Administered 2019-11-05: 1.5 mg via INTRAMUSCULAR

## 2019-11-05 NOTE — Assessment & Plan Note (Signed)
Depo-Estradiol 0.3 ml inj

## 2019-11-05 NOTE — Assessment & Plan Note (Signed)
Toprol, Nifedipine 

## 2019-11-05 NOTE — Progress Notes (Signed)
Subjective:  Patient ID: [name], female    DOB: 05-17-1946  Age: 74 y.o. MRN: [id number]  CC: No chief complaint on file.   HPI TAMEKIA ROTTER presents for arthritis, insomnia, HTN, CAD, hot flashes C/o R shoulder pain, foot pain and swelling  Outpatient Medications Prior to Visit  Medication Sig Dispense Refill  . aspirin 81 MG EC tablet Take 81 mg by mouth daily.      . Cholecalciferol 1000 UNITS tablet Take 1,000 Units by mouth daily.      . clobetasol ointment (TEMOVATE) 7.90 % Apply 1 application topically 2 (two) times daily. Apply as directed twice daily for up to 2 weeks, then space out to 1-2 x a week. Not for daily long term use. 30 g 0  . clorazepate (TRANXENE) 15 MG tablet Take 1 tablet (15 mg total) by mouth 3 (three) times daily as needed. for anxiety 270 tablet 1  . doxepin (SINEQUAN) 50 MG capsule Take 3 capsules (150 mg total) by mouth at bedtime. 270 capsule 3  . estradiol (ESTRACE) 0.1 MG/GM vaginal cream Place 1 Applicatorful vaginally at bedtime as needed.    Marland Kitchen estradiol cypionate (DEPO-ESTRADIOL) 5 MG/ML injection Use 0.3 ml every 18-25 days IM 5 mL 3  . furosemide (LASIX) 40 MG tablet Take 1 tablet (40 mg total) by mouth 2 (two) times daily. 180 tablet 3  . levothyroxine (SYNTHROID) 137 MCG tablet TAKE 1 TABLET BY MOUTH ONCE DAILY BEFORE BREAKFAST 90 tablet 3  . LORazepam (ATIVAN) 0.5 MG tablet TAKE 1 TABLET BY MOUTH AT BEDTIME 90 tablet 1  . metoprolol tartrate (LOPRESSOR) 50 MG tablet Take 1 tablet (50 mg total) by mouth 3 (three) times daily. 270 tablet 3  . NIFEdipine (PROCARDIA-XL/NIFEDICAL-XL) 30 MG 24 hr tablet Take 1 tablet (30 mg total) by mouth daily. 90 tablet 3  . nitroGLYCERIN (NITROLINGUAL) 0.4 MG/SPRAY spray PLACE 1 SPRAY UNDER THE TONGUE EVERY 5 MINUTES AS NEEDED FOR CHEST PAIN. MAY REPEAT 3 TIMES 12 g 1  . potassium chloride SA (KLOR-CON M20) 20 MEQ tablet Take 1 tablet (20 mEq total) by mouth 2 (two) times daily. 180 tablet 3    Facility-Administered Medications Prior to Visit  Medication Dose Route Frequency Provider Last Rate Last Admin  . estradiol cypionate (DEPO-ESTRADIOL) 5 MG/ML injection 1.5 mg  1.5 mg Intramuscular Q30 days Ellaree Gear, Evie Lacks, MD   1.5 mg at 10/07/19 1649    ROS: Review of Systems  Constitutional: Negative for activity change, appetite change, chills, fatigue and unexpected weight change.  HENT: Negative for congestion, mouth sores and sinus pressure.   Eyes: Negative for visual disturbance.  Respiratory: Negative for cough and chest tightness.   Gastrointestinal: Negative for abdominal pain and nausea.  Genitourinary: Negative for difficulty urinating, frequency and vaginal pain.  Musculoskeletal: Positive for arthralgias and gait problem. Negative for back pain.  Skin: Negative for pallor and rash.  Neurological: Negative for dizziness, tremors, weakness, numbness and headaches.  Psychiatric/Behavioral: Positive for sleep disturbance. Negative for confusion.    Objective:  BP (!) 146/82 (BP Location: Left Arm, Patient Position: Sitting, Cuff Size: Normal)   Pulse 81   Temp 98.2 F (36.8 C) (Oral)   Ht 5\' 8"  (1.727 m)   Wt 145 lb (65.8 kg)   SpO2 97%   BMI 22.05 kg/m   BP Readings from Last 3 Encounters:  11/05/19 (!) 146/82  10/07/19 (!) 188/82  09/09/19 126/82    Wt Readings from Last 3  Encounters:  11/05/19 145 lb (65.8 kg)  10/07/19 148 lb (67.1 kg)  09/09/19 148 lb (67.1 kg)    Physical Exam Constitutional:      General: She is not in acute distress.    Appearance: She is well-developed.  HENT:     Head: Normocephalic.     Right Ear: External ear normal.     Left Ear: External ear normal.     Nose: Nose normal.  Eyes:     General:        Right eye: No discharge.        Left eye: No discharge.     Conjunctiva/sclera: Conjunctivae normal.     Pupils: Pupils are equal, round, and reactive to light.  Neck:     Thyroid: No thyromegaly.     Vascular:  No JVD.     Trachea: No tracheal deviation.  Cardiovascular:     Rate and Rhythm: Normal rate and regular rhythm.     Heart sounds: Normal heart sounds.  Pulmonary:     Effort: No respiratory distress.     Breath sounds: No stridor. No wheezing.  Abdominal:     General: Bowel sounds are normal. There is no distension.     Palpations: Abdomen is soft. There is no mass.     Tenderness: There is no abdominal tenderness. There is no guarding or rebound.  Musculoskeletal:        General: No tenderness.     Cervical back: Normal range of motion and neck supple.  Lymphadenopathy:     Cervical: No cervical adenopathy.  Skin:    Findings: No erythema or rash.  Neurological:     Mental Status: She is oriented to person, place, and time.     Cranial Nerves: No cranial nerve deficit.     Motor: No abnormal muscle tone.     Coordination: Coordination normal.     Gait: Gait abnormal.     Deep Tendon Reflexes: Reflexes normal.  Psychiatric:        Behavior: Behavior normal.        Thought Content: Thought content normal.        Judgment: Judgment normal.   R foot w/dorsal swelling R shoulder pain w/ROM  Lab Results  Component Value Date   WBC 4.8 07/29/2018   HGB 12.4 07/29/2018   HCT 36.9 07/29/2018   PLT 239.0 07/29/2018   GLUCOSE 91 07/29/2018   CHOL 236 (H) 11/02/2016   TRIG 100.0 11/02/2016   HDL 60.60 11/02/2016   LDLDIRECT 188.2 01/10/2012   LDLCALC 155 (H) 11/02/2016   ALT 14 07/29/2018   AST 19 07/29/2018   NA 139 07/29/2018   K 3.8 07/29/2018   CL 103 07/29/2018   CREATININE 1.11 07/29/2018   BUN 14 07/29/2018   CO2 26 07/29/2018   TSH 0.15 (L) 07/17/2018   INR 1.0 08/01/2018    CT RENAL STONE STUDY  Result Date: 08/01/2018 CLINICAL DATA:  Hematuria, loose stools EXAM: CT ABDOMEN AND PELVIS WITHOUT CONTRAST TECHNIQUE: Multidetector CT imaging of the abdomen and pelvis was performed following the standard protocol without IV contrast. COMPARISON:  12/24/2014  FINDINGS: Lower chest: No acute abnormality. Coronary artery calcifications. Hepatobiliary: No focal liver abnormality is seen. No gallstones, gallbladder wall thickening, or biliary dilatation. Pancreas: Unremarkable. No pancreatic ductal dilatation or surrounding inflammatory changes. Spleen: Normal in size without focal abnormality. Adrenals/Urinary Tract: Adrenal glands are unremarkable. Kidneys are normal, without renal calculi, focal lesion, or hydronephrosis. Multiple small calculi  in the dependent urinary bladder as seen on prior examination. Stomach/Bowel: Stomach is within normal limits. Appendix appears normal. No evidence of bowel wall thickening, distention, or inflammatory changes. Vascular/Lymphatic: Calcific atherosclerosis. No enlarged abdominal or pelvic lymph nodes. Reproductive: No mass or other abnormality. Status post hysterectomy. Other: No abdominal wall hernia or abnormality. No abdominopelvic ascites. Musculoskeletal: No acute or significant osseous findings. IMPRESSION: 1. No acute noncontrast CT findings of the abdomen or pelvis to explain hematuria or loose stools. 2. Multiple small calculi within the dependent urinary bladder, similar to prior study. Electronically Signed   By: Lauralyn Primes M.D.   On: 08/01/2018 15:42    Assessment & Plan:    Sonda Primes, MD

## 2019-11-05 NOTE — Addendum Note (Signed)
Addended by: Nena Alexander C on: 11/05/2019 05:09 PM   Modules accepted: Orders

## 2019-11-05 NOTE — Assessment & Plan Note (Signed)
Worse Depo-medrol 80 mg IM 

## 2019-11-19 ENCOUNTER — Ambulatory Visit (INDEPENDENT_AMBULATORY_CARE_PROVIDER_SITE_OTHER): Payer: Medicare Other | Admitting: Internal Medicine

## 2019-11-19 ENCOUNTER — Encounter: Payer: Self-pay | Admitting: Internal Medicine

## 2019-11-19 ENCOUNTER — Other Ambulatory Visit: Payer: Self-pay

## 2019-11-19 VITALS — BP 142/90 | HR 83 | Temp 98.0°F | Ht 68.0 in | Wt 146.0 lb

## 2019-11-19 DIAGNOSIS — M79671 Pain in right foot: Secondary | ICD-10-CM

## 2019-11-19 DIAGNOSIS — M10072 Idiopathic gout, left ankle and foot: Secondary | ICD-10-CM | POA: Diagnosis not present

## 2019-11-19 NOTE — Patient Instructions (Addendum)
Keep the foot elevated and use ice for another day then you can use ice or heat on it.    RICE Therapy for Routine Care of Injuries Many injuries can be cared for with rest, ice, compression, and elevation (RICE therapy). This includes:  Resting the injured part.  Putting ice on the injury.  Putting pressure (compression) on the injury.  Raising the injured part (elevation). Using RICE therapy can help to lessen pain and swelling. Supplies needed:  Ice.  Plastic bag.  Towel.  Elastic bandage.  Pillow or pillows to raise (elevate) your injured body part. How to care for your injury with RICE therapy Rest Limit your normal activities, and try not to use the injured part of your body. You can go back to your normal activities when your doctor says it is okay to do them and you feel okay. Ask your doctor if you should do exercises to help your injury get better. Ice Put ice on the injured area. Do not put ice on your bare skin.  Put ice in a plastic bag.  Place a towel between your skin and the bag.  Leave the ice on for 20 minutes, 2-3 times a day. Use ice on as many days as told by your doctor.  Compression Compression means putting pressure on the injured area. This can be done with an elastic bandage. If an elastic bandage has been put on your injury:  Do not wrap the bandage too tight. Wrap the bandage more loosely if part of your body away from the bandage is blue, swollen, cold, painful, or loses feeling (gets numb).  Take off the bandage and put it on again. Do this every 3-4 hours or as told by your doctor.  See your doctor if the bandage seems to make your problems worse.  Elevation Elevation means keeping the injured area raised. If you can, raise the injured area above your heart or the center of your chest. Contact a doctor if:  You keep having pain and swelling.  Your symptoms get worse. Get help right away if:  You have sudden bad pain at your injury  or lower than your injury.  You have redness or more swelling around your injury.  You have tingling or numbness at your injury or lower than your injury, and it does not go away when you take off the bandage. Summary  Many injuries can be cared for using rest, ice, compression, and elevation (RICE therapy).  You can go back to your normal activities when you feel okay and your doctor says it is okay.  Put ice on the injured area as told by your doctor.  Get help if your symptoms get worse or if you keep having pain and swelling. This information is not intended to replace advice given to you by your health care provider. Make sure you discuss any questions you have with your health care provider. Document Revised: 01/25/2017 Document Reviewed: 01/25/2017 Elsevier Patient Education  2020 [employer].

## 2019-11-19 NOTE — Progress Notes (Signed)
Subjective:   Patient ID: [name], female    DOB: 08-Aug-1945, 74 y.o.   MRN: [id number]  HPI The patient is a 74 YO female coming in for right foot pain. Started when she was sitting at her office chair with feet up on the foot supports and spun. She felt a pop in the right foot/ankle. This did hurt immediately. She then was able to get up and walk and did not take anything for pain that night. This happened 2-3 days ago. She woke the next day with some swelling and throbbing pain. Did ice the foot once that day and used tylenol for pain. She has some topical agents she used for pain and heat in the foot as well. She does have history of gout and was not sure if that was it. She did not feel much better today so came for visit. Later she clarifies that the foot is still hurting with walking but not throbbing as much while resting. She did google and found some exercises and tried those yesterday and then the foot hurt more after that. Does have some swelling in the foot today but better overall than yesterday.   Review of Systems  Constitutional: Negative.   HENT: Negative.   Eyes: Negative.   Respiratory: Negative for cough, chest tightness and shortness of breath.   Cardiovascular: Negative for chest pain, palpitations and leg swelling.  Gastrointestinal: Negative for abdominal distention, abdominal pain, constipation, diarrhea, nausea and vomiting.  Musculoskeletal: Positive for arthralgias, gait problem and myalgias.  Skin: Negative.   Psychiatric/Behavioral: Negative.     Objective:  Physical Exam Constitutional:      Appearance: She is well-developed.  HENT:     Head: Normocephalic and atraumatic.  Cardiovascular:     Rate and Rhythm: Normal rate and regular rhythm.  Pulmonary:     Effort: Pulmonary effort is normal. No respiratory distress.     Breath sounds: Normal breath sounds. No wheezing or rales.  Abdominal:     General: Bowel sounds are normal. There is no  distension.     Palpations: Abdomen is soft.     Tenderness: There is no abdominal tenderness. There is no rebound.  Musculoskeletal:        General: Tenderness present.     Cervical back: Normal range of motion.     Comments: Some tenderness midline foot top of foot with 1+ swelling in the foot and toes. Able to walk with some discomfort in the office. No pain to eversion and inversion in the office.   Skin:    General: Skin is warm and dry.  Neurological:     Mental Status: She is alert and oriented to person, place, and time.     Coordination: Coordination normal.     Vitals:   11/19/19 1546  BP: (!) 142/90  Pulse: 83  Temp: 98 F (36.7 C)  TempSrc: Oral  SpO2: 97%  Weight: 146 lb (66.2 kg)  Height: 5\' 8"  (1.727 m)    This visit occurred during the SARS-CoV-2 public health emergency.  Safety protocols were in place, including screening questions prior to the visit, additional usage of staff PPE, and extensive cleaning of exam room while observing appropriate contact time as indicated for disinfecting solutions.   Assessment & Plan:  Visit time 25 minutes in face to face communication with patient and coordination of care, additional 5 minutes spent in record review, coordination or care, ordering tests, communicating/referring to other healthcare professionals, documenting  in medical records all on the same day of the visit for total time 30 minutes spent on the visit.

## 2019-11-20 ENCOUNTER — Encounter: Payer: Self-pay | Admitting: Internal Medicine

## 2019-11-20 DIAGNOSIS — M79671 Pain in right foot: Secondary | ICD-10-CM | POA: Insufficient documentation

## 2019-11-20 DIAGNOSIS — M79604 Pain in right leg: Secondary | ICD-10-CM | POA: Insufficient documentation

## 2019-11-20 NOTE — Assessment & Plan Note (Signed)
She is concerned about gout but this is not consistent with gout and is more consistent with an injury/sprain to the ankle. Educated extensively about gout and typical signs and symptoms of gout.

## 2019-11-20 NOTE — Assessment & Plan Note (Signed)
She does have past history of bad sprain to the same ankle many years ago which likely predisposed to sprain again. No evidence for fracture or indication for imaging at today's visit. Educated extensively about RICE and advised to use ice on the ankle and elevate to help with swelling. Can use compression sleeve she has at home. Given her allergies to multiple pain medications tylenol is safest for pain and she is advised to use that or other topical agents which she does not have allergy against at home for pain.

## 2019-12-03 ENCOUNTER — Encounter: Payer: Self-pay | Admitting: Internal Medicine

## 2019-12-03 ENCOUNTER — Ambulatory Visit (INDEPENDENT_AMBULATORY_CARE_PROVIDER_SITE_OTHER): Payer: Medicare Other | Admitting: Internal Medicine

## 2019-12-03 ENCOUNTER — Other Ambulatory Visit: Payer: Self-pay

## 2019-12-03 VITALS — BP 160/82 | HR 57 | Temp 98.1°F | Ht 68.0 in | Wt 145.0 lb

## 2019-12-03 DIAGNOSIS — M79671 Pain in right foot: Secondary | ICD-10-CM

## 2019-12-03 DIAGNOSIS — I10 Essential (primary) hypertension: Secondary | ICD-10-CM

## 2019-12-03 DIAGNOSIS — N951 Menopausal and female climacteric states: Secondary | ICD-10-CM

## 2019-12-03 DIAGNOSIS — Z79890 Hormone replacement therapy (postmenopausal): Secondary | ICD-10-CM

## 2019-12-03 DIAGNOSIS — F419 Anxiety disorder, unspecified: Secondary | ICD-10-CM

## 2019-12-03 DIAGNOSIS — E785 Hyperlipidemia, unspecified: Secondary | ICD-10-CM

## 2019-12-03 DIAGNOSIS — M10072 Idiopathic gout, left ankle and foot: Secondary | ICD-10-CM

## 2019-12-03 DIAGNOSIS — E034 Atrophy of thyroid (acquired): Secondary | ICD-10-CM

## 2019-12-03 MED ORDER — CLOBETASOL PROPIONATE 0.05 % EX OINT: 1.0000 "application " | TOPICAL_OINTMENT | Freq: Two times a day (BID) | CUTANEOUS | 0 refills | Status: DC

## 2019-12-03 MED ORDER — ESTRADIOL 0.1 MG/GM VA CREA: 1.0000 | TOPICAL_CREAM | Freq: Every evening | VAGINAL | 0 refills | Status: DC | PRN

## 2019-12-03 MED ORDER — ESTRADIOL CYPIONATE 5 MG/ML IM OIL
1.5000 mg | TOPICAL_OIL | Freq: Once | INTRAMUSCULAR | Status: AC
  Administered 2019-12-03: 1.5 mg via INTRAMUSCULAR

## 2019-12-03 NOTE — Assessment & Plan Note (Signed)
Toprol, Nifedipine

## 2019-12-03 NOTE — Addendum Note (Signed)
Addended by: Marshell Garfinkel on: 12/03/2019 05:17 PM   Modules accepted: Orders

## 2019-12-03 NOTE — Assessment & Plan Note (Signed)
Depo-Estradiol 0.3 ml IM

## 2019-12-03 NOTE — Assessment & Plan Note (Signed)
Resolving post-strain

## 2019-12-03 NOTE — Assessment & Plan Note (Signed)
Labs

## 2019-12-03 NOTE — Progress Notes (Signed)
Subjective:  Patient ID: [name], female    DOB: [birth year]  Age: 74 y.o. MRN: [id number]  CC: No chief complaint on file.   HPI [name] presents for R anterior ankle sprain f/u  - better F/u hot flashes, HTN   Outpatient Medications Prior to Visit  Medication Sig Dispense Refill  . aspirin 81 MG EC tablet Take 81 mg by mouth daily.      . Cholecalciferol 1000 UNITS tablet Take 1,000 Units by mouth daily.      . clobetasol ointment (TEMOVATE) 0.05 % Apply 1 application topically 2 (two) times daily. Apply as directed twice daily for up to 2 weeks, then space out to 1-2 x a week. Not for daily long term use. 30 g 0  . clorazepate (TRANXENE) 15 MG tablet Take 1 tablet (15 mg total) by mouth 3 (three) times daily as needed. for anxiety 270 tablet 1  . doxepin (SINEQUAN) 50 MG capsule Take 3 capsules (150 mg total) by mouth at bedtime. 270 capsule 3  . estradiol (ESTRACE) 0.1 MG/GM vaginal cream Place 1 Applicatorful vaginally at bedtime as needed.    Marland Kitchen estradiol cypionate (DEPO-ESTRADIOL) 5 MG/ML injection Use 0.3 ml every 18-25 days IM 5 mL 3  . furosemide (LASIX) 40 MG tablet Take 1 tablet (40 mg total) by mouth 2 (two) times daily. 180 tablet 3  . levothyroxine (SYNTHROID) 137 MCG tablet TAKE 1 TABLET BY MOUTH ONCE DAILY BEFORE BREAKFAST 90 tablet 3  . LORazepam (ATIVAN) 0.5 MG tablet TAKE 1 TABLET BY MOUTH AT BEDTIME 90 tablet 1  . metoprolol tartrate (LOPRESSOR) 50 MG tablet Take 1 tablet (50 mg total) by mouth 3 (three) times daily. 270 tablet 3  . NIFEdipine (PROCARDIA-XL/NIFEDICAL-XL) 30 MG 24 hr tablet Take 1 tablet (30 mg total) by mouth daily. 90 tablet 3  . nitroGLYCERIN (NITROLINGUAL) 0.4 MG/SPRAY spray PLACE 1 SPRAY UNDER THE TONGUE EVERY 5 MINUTES AS NEEDED FOR CHEST PAIN. MAY REPEAT 3 TIMES 12 g 1  . potassium chloride SA (KLOR-CON M20) 20 MEQ tablet Take 1 tablet (20 mEq total) by mouth 2 (two) times daily. 180 tablet 3   Facility-Administered Medications Prior to  Visit  Medication Dose Route Frequency Provider Last Rate Last Admin  . estradiol cypionate (DEPO-ESTRADIOL) 5 MG/ML injection 1.5 mg  1.5 mg Intramuscular Q30 days Cheyne Boulden, Georgina Quint, MD   1.5 mg at 10/07/19 1649    ROS: Review of Systems  Constitutional: Negative for activity change, appetite change, chills, fatigue and unexpected weight change.  HENT: Negative for congestion, mouth sores and sinus pressure.   Eyes: Negative for visual disturbance.  Respiratory: Negative for cough and chest tightness.   Gastrointestinal: Negative for abdominal pain and nausea.  Genitourinary: Negative for difficulty urinating, frequency and vaginal pain.  Musculoskeletal: Positive for arthralgias. Negative for back pain and gait problem.  Skin: Negative for pallor and rash.  Neurological: Negative for dizziness, tremors, weakness, numbness and headaches.  Psychiatric/Behavioral: Negative for confusion and sleep disturbance.    Objective:  BP (!) 160/82 (BP Location: Left Arm, Patient Position: Sitting, Cuff Size: Normal)   Pulse (!) 57   Temp 98.1 F (36.7 C) (Oral)   Ht 5\' 8"  (1.727 m)   Wt 145 lb (65.8 kg)   SpO2 96%   BMI 22.05 kg/m   BP Readings from Last 3 Encounters:  12/03/19 (!) 160/82  11/19/19 (!) 142/90  11/05/19 (!) 146/82    Wt Readings from Last 3  Encounters:  12/03/19 145 lb (65.8 kg)  11/19/19 146 lb (66.2 kg)  11/05/19 145 lb (65.8 kg)    Physical Exam Constitutional:      General: She is not in acute distress.    Appearance: She is well-developed.  HENT:     Head: Normocephalic.     Right Ear: External ear normal.     Left Ear: External ear normal.     Nose: Nose normal.  Eyes:     General:        Right eye: No discharge.        Left eye: No discharge.     Conjunctiva/sclera: Conjunctivae normal.     Pupils: Pupils are equal, round, and reactive to light.  Neck:     Thyroid: No thyromegaly.     Vascular: No JVD.     Trachea: No tracheal deviation.    Cardiovascular:     Rate and Rhythm: Normal rate and regular rhythm.     Heart sounds: Normal heart sounds.  Pulmonary:     Effort: No respiratory distress.     Breath sounds: No stridor. No wheezing.  Abdominal:     General: Bowel sounds are normal. There is no distension.     Palpations: Abdomen is soft. There is no mass.     Tenderness: There is no abdominal tenderness. There is no guarding or rebound.  Musculoskeletal:        General: No tenderness.     Cervical back: Normal range of motion and neck supple.  Lymphadenopathy:     Cervical: No cervical adenopathy.  Skin:    Findings: No erythema or rash.  Neurological:     Cranial Nerves: No cranial nerve deficit.     Motor: No abnormal muscle tone.     Coordination: Coordination normal.     Deep Tendon Reflexes: Reflexes normal.  Psychiatric:        Behavior: Behavior normal.        Thought Content: Thought content normal.        Judgment: Judgment normal.    R foot - NT, no swelling   Lab Results  Component Value Date   WBC 4.8 07/29/2018   HGB 12.4 07/29/2018   HCT 36.9 07/29/2018   PLT 239.0 07/29/2018   GLUCOSE 91 07/29/2018   CHOL 236 (H) 11/02/2016   TRIG 100.0 11/02/2016   HDL 60.60 11/02/2016   LDLDIRECT 188.2 01/10/2012   LDLCALC 155 (H) 11/02/2016   ALT 14 07/29/2018   AST 19 07/29/2018   NA 139 07/29/2018   K 3.8 07/29/2018   CL 103 07/29/2018   CREATININE 1.11 07/29/2018   BUN 14 07/29/2018   CO2 26 07/29/2018   TSH 0.15 (L) 07/17/2018   INR 1.0 08/01/2018    CT RENAL STONE STUDY  Result Date: 08/01/2018 CLINICAL DATA:  Hematuria, loose stools EXAM: CT ABDOMEN AND PELVIS WITHOUT CONTRAST TECHNIQUE: Multidetector CT imaging of the abdomen and pelvis was performed following the standard protocol without IV contrast. COMPARISON:  12/24/2014 FINDINGS: Lower chest: No acute abnormality. Coronary artery calcifications. Hepatobiliary: No focal liver abnormality is seen. No gallstones, gallbladder  wall thickening, or biliary dilatation. Pancreas: Unremarkable. No pancreatic ductal dilatation or surrounding inflammatory changes. Spleen: Normal in size without focal abnormality. Adrenals/Urinary Tract: Adrenal glands are unremarkable. Kidneys are normal, without renal calculi, focal lesion, or hydronephrosis. Multiple small calculi in the dependent urinary bladder as seen on prior examination. Stomach/Bowel: Stomach is within normal limits. Appendix appears normal. No  evidence of bowel wall thickening, distention, or inflammatory changes. Vascular/Lymphatic: Calcific atherosclerosis. No enlarged abdominal or pelvic lymph nodes. Reproductive: No mass or other abnormality. Status post hysterectomy. Other: No abdominal wall hernia or abnormality. No abdominopelvic ascites. Musculoskeletal: No acute or significant osseous findings. IMPRESSION: 1. No acute noncontrast CT findings of the abdomen or pelvis to explain hematuria or loose stools. 2. Multiple small calculi within the dependent urinary bladder, similar to prior study. Electronically Signed   By: Lauralyn Primes M.D.   On: 08/01/2018 15:42    Assessment & Plan:     Follow-up: No follow-ups on file.  Sonda Primes, MD

## 2019-12-03 NOTE — Assessment & Plan Note (Signed)
Levothroid 

## 2019-12-03 NOTE — Assessment & Plan Note (Signed)
Lorazepam prn, Doxepine at hs

## 2019-12-03 NOTE — Assessment & Plan Note (Signed)
No relapse 

## 2019-12-29 ENCOUNTER — Other Ambulatory Visit: Payer: Medicare Other

## 2019-12-29 DIAGNOSIS — M79671 Pain in right foot: Secondary | ICD-10-CM

## 2019-12-29 DIAGNOSIS — E034 Atrophy of thyroid (acquired): Secondary | ICD-10-CM

## 2019-12-29 DIAGNOSIS — E785 Hyperlipidemia, unspecified: Secondary | ICD-10-CM

## 2019-12-29 DIAGNOSIS — Z79890 Hormone replacement therapy (postmenopausal): Secondary | ICD-10-CM

## 2019-12-29 DIAGNOSIS — M10072 Idiopathic gout, left ankle and foot: Secondary | ICD-10-CM | POA: Diagnosis not present

## 2019-12-30 LAB — CBC WITH DIFFERENTIAL/PLATELET
Absolute Monocytes: 409 cells/uL (ref 200–950)
Basophils Absolute: 39 cells/uL (ref 0–200)
Basophils Relative: 0.7 %
Eosinophils Absolute: 157 cells/uL (ref 15–500)
Eosinophils Relative: 2.8 %
HCT: 37 % (ref 35.0–45.0)
Hemoglobin: 12.3 g/dL (ref 11.7–15.5)
Lymphs Abs: 1686 cells/uL (ref 850–3900)
MCH: 30.4 pg (ref 27.0–33.0)
MCHC: 33.2 g/dL (ref 32.0–36.0)
MCV: 91.4 fL (ref 80.0–100.0)
MPV: 10.6 fL (ref 7.5–12.5)
Monocytes Relative: 7.3 %
Neutro Abs: 3310 cells/uL (ref 1500–7800)
Neutrophils Relative %: 59.1 %
Platelets: 268 10*3/uL (ref 140–400)
RBC: 4.05 10*6/uL (ref 3.80–5.10)
RDW: 12.6 % (ref 11.0–15.0)
Total Lymphocyte: 30.1 %
WBC: 5.6 10*3/uL (ref 3.8–10.8)

## 2019-12-30 LAB — HEPATIC FUNCTION PANEL
AG Ratio: 1.6 (calc) (ref 1.0–2.5)
ALT: 11 U/L (ref 6–29)
AST: 14 U/L (ref 10–35)
Albumin: 3.9 g/dL (ref 3.6–5.1)
Alkaline phosphatase (APISO): 85 U/L (ref 37–153)
Bilirubin, Direct: 0.1 mg/dL (ref 0.0–0.2)
Globulin: 2.5 g/dL (calc) (ref 1.9–3.7)
Indirect Bilirubin: 0.4 mg/dL (calc) (ref 0.2–1.2)
Total Bilirubin: 0.5 mg/dL (ref 0.2–1.2)
Total Protein: 6.4 g/dL (ref 6.1–8.1)

## 2019-12-30 LAB — LIPID PANEL
Cholesterol: 235 mg/dL — ABNORMAL HIGH (ref <200)
HDL: 69 mg/dL (ref >=50)
LDL Cholesterol (Calc): 145 mg/dL (calc) — ABNORMAL HIGH
Non-HDL Cholesterol (Calc): 166 mg/dL (calc) — ABNORMAL HIGH (ref <130)
Total CHOL/HDL Ratio: 3.4 (calc) (ref <5.0)
Triglycerides: 100 mg/dL (ref <150)

## 2019-12-30 LAB — URINALYSIS
Bilirubin Urine: NEGATIVE
Glucose, UA: NEGATIVE
Hgb urine dipstick: NEGATIVE
Ketones, ur: NEGATIVE
Nitrite: POSITIVE — AB
Protein, ur: NEGATIVE
Specific Gravity, Urine: 1.008 (ref 1.001–1.03)
pH: 7 (ref 5.0–8.0)

## 2019-12-30 LAB — BASIC METABOLIC PANEL
BUN/Creatinine Ratio: 16 (calc) (ref 6–22)
BUN: 19 mg/dL (ref 7–25)
CO2: 27 mmol/L (ref 20–32)
Calcium: 9 mg/dL (ref 8.6–10.4)
Chloride: 104 mmol/L (ref 98–110)
Creat: 1.2 mg/dL — ABNORMAL HIGH (ref 0.60–0.93)
Glucose, Bld: 93 mg/dL (ref 65–99)
Potassium: 3.9 mmol/L (ref 3.5–5.3)
Sodium: 140 mmol/L (ref 135–146)

## 2019-12-30 LAB — T4, FREE: Free T4: 2.1 ng/dL — ABNORMAL HIGH (ref 0.8–1.8)

## 2019-12-30 LAB — TSH: TSH: <0.01 mIU/L — ABNORMAL LOW (ref 0.40–4.50)

## 2019-12-31 ENCOUNTER — Other Ambulatory Visit: Payer: Self-pay

## 2019-12-31 ENCOUNTER — Encounter: Payer: Self-pay | Admitting: Internal Medicine

## 2019-12-31 ENCOUNTER — Ambulatory Visit (INDEPENDENT_AMBULATORY_CARE_PROVIDER_SITE_OTHER): Payer: Medicare Other | Admitting: Internal Medicine

## 2019-12-31 VITALS — BP 180/88 | HR 79 | Temp 98.2°F | Ht 68.0 in | Wt 140.0 lb

## 2019-12-31 DIAGNOSIS — S300XXA Contusion of lower back and pelvis, initial encounter: Secondary | ICD-10-CM

## 2019-12-31 DIAGNOSIS — F419 Anxiety disorder, unspecified: Secondary | ICD-10-CM

## 2019-12-31 DIAGNOSIS — N951 Menopausal and female climacteric states: Secondary | ICD-10-CM

## 2019-12-31 DIAGNOSIS — I10 Essential (primary) hypertension: Secondary | ICD-10-CM

## 2019-12-31 MED ORDER — ESTRADIOL CYPIONATE 5 MG/ML IM OIL
1.5000 mg | TOPICAL_OIL | Freq: Once | INTRAMUSCULAR | Status: AC
  Administered 2019-12-31: 1.5 mg via INTRAMUSCULAR

## 2019-12-31 NOTE — Assessment & Plan Note (Addendum)
Use gentle heat

## 2019-12-31 NOTE — Progress Notes (Signed)
Subjective:  Patient ID: [name], female    DOB: June 09, 1945  Age: 74 y.o. MRN: [id number]  CC: No chief complaint on file.   HPI LARYSA PALL presents for hot flashes f/u C/o hit her L buttock againt commode; L buttock was bruised x 3 weeks  Outpatient Medications Prior to Visit  Medication Sig Dispense Refill  . aspirin 81 MG EC tablet Take 81 mg by mouth daily.      . Cholecalciferol 1000 UNITS tablet Take 1,000 Units by mouth daily.      . clobetasol ointment (TEMOVATE) 0.05 % Apply 1 application topically 2 (two) times daily. Apply as directed twice daily for up to 2 weeks, then space out to 1-2 x a week. Not for daily long term use. 30 g 0  . clorazepate (TRANXENE) 15 MG tablet Take 1 tablet (15 mg total) by mouth 3 (three) times daily as needed. for anxiety 270 tablet 1  . doxepin (SINEQUAN) 50 MG capsule Take 3 capsules (150 mg total) by mouth at bedtime. 270 capsule 3  . estradiol (ESTRACE) 0.1 MG/GM vaginal cream Place 1 Applicatorful vaginally at bedtime as needed. 42.5 g 0  . estradiol cypionate (DEPO-ESTRADIOL) 5 MG/ML injection Use 0.3 ml every 18-25 days IM 5 mL 3  . furosemide (LASIX) 40 MG tablet Take 1 tablet (40 mg total) by mouth 2 (two) times daily. 180 tablet 3  . levothyroxine (SYNTHROID) 137 MCG tablet TAKE 1 TABLET BY MOUTH ONCE DAILY BEFORE BREAKFAST 90 tablet 3  . LORazepam (ATIVAN) 0.5 MG tablet TAKE 1 TABLET BY MOUTH AT BEDTIME 90 tablet 1  . metoprolol tartrate (LOPRESSOR) 50 MG tablet Take 1 tablet (50 mg total) by mouth 3 (three) times daily. 270 tablet 3  . NIFEdipine (PROCARDIA-XL/NIFEDICAL-XL) 30 MG 24 hr tablet Take 1 tablet (30 mg total) by mouth daily. 90 tablet 3  . nitroGLYCERIN (NITROLINGUAL) 0.4 MG/SPRAY spray PLACE 1 SPRAY UNDER THE TONGUE EVERY 5 MINUTES AS NEEDED FOR CHEST PAIN. MAY REPEAT 3 TIMES 12 g 1  . potassium chloride SA (KLOR-CON M20) 20 MEQ tablet Take 1 tablet (20 mEq total) by mouth 2 (two) times daily. 180 tablet 3    Facility-Administered Medications Prior to Visit  Medication Dose Route Frequency Provider Last Rate Last Admin  . estradiol cypionate (DEPO-ESTRADIOL) 5 MG/ML injection 1.5 mg  1.5 mg Intramuscular Q30 days Karam Dunson, Georgina Quint, MD   1.5 mg at 10/07/19 1649    ROS: Review of Systems  Constitutional: Negative for activity change, appetite change, chills, fatigue and unexpected weight change.  HENT: Negative for congestion, mouth sores and sinus pressure.   Eyes: Negative for visual disturbance.  Respiratory: Negative for cough and chest tightness.   Gastrointestinal: Negative for abdominal pain and nausea.  Genitourinary: Negative for difficulty urinating, frequency and vaginal pain.  Musculoskeletal: Negative for back pain and gait problem.  Skin: Negative for pallor and rash.  Neurological: Negative for dizziness, tremors, weakness, numbness and headaches.  Hematological: Bruises/bleeds easily.  Psychiatric/Behavioral: Negative for confusion and sleep disturbance. The patient is nervous/anxious.     Objective:  BP (!) 180/88 (BP Location: Left Arm, Patient Position: Sitting, Cuff Size: Normal)   Pulse 79   Temp 98.2 F (36.8 C) (Oral)   Ht 5\' 8"  (1.727 m)   Wt 140 lb (63.5 kg)   SpO2 96%   BMI 21.29 kg/m   BP Readings from Last 3 Encounters:  12/31/19 (!) 180/88  12/03/19 (!) 160/82  11/19/19 01/20/20)  142/90    Wt Readings from Last 3 Encounters:  12/31/19 140 lb (63.5 kg)  12/03/19 145 lb (65.8 kg)  11/19/19 146 lb (66.2 kg)    Physical Exam Constitutional:      General: She is not in acute distress.    Appearance: She is well-developed.  HENT:     Head: Normocephalic.     Right Ear: External ear normal.     Left Ear: External ear normal.     Nose: Nose normal.  Eyes:     General:        Right eye: No discharge.        Left eye: No discharge.     Conjunctiva/sclera: Conjunctivae normal.     Pupils: Pupils are equal, round, and reactive to light.  Neck:      Thyroid: No thyromegaly.     Vascular: No JVD.     Trachea: No tracheal deviation.  Cardiovascular:     Rate and Rhythm: Normal rate and regular rhythm.     Heart sounds: Normal heart sounds.  Pulmonary:     Effort: No respiratory distress.     Breath sounds: No stridor. No wheezing.  Abdominal:     General: Bowel sounds are normal. There is no distension.     Palpations: Abdomen is soft. There is no mass.     Tenderness: There is no abdominal tenderness. There is no guarding or rebound.  Musculoskeletal:        General: Tenderness present.     Cervical back: Normal range of motion and neck supple.  Lymphadenopathy:     Cervical: No cervical adenopathy.  Skin:    Findings: Bruising present. No erythema or rash.  Neurological:     Mental Status: She is oriented to person, place, and time.     Cranial Nerves: No cranial nerve deficit.     Motor: No abnormal muscle tone.     Coordination: Coordination normal.     Deep Tendon Reflexes: Reflexes normal.  Psychiatric:        Behavior: Behavior normal.        Thought Content: Thought content normal.        Judgment: Judgment normal.     L mid buttock with a bruise and a distal left buttock w/ palpable hematoma   Lab Results  Component Value Date   WBC 5.6 12/29/2019   HGB 12.3 12/29/2019   HCT 37.0 12/29/2019   PLT 268 12/29/2019   GLUCOSE 93 12/29/2019   CHOL 235 (H) 12/29/2019   TRIG 100 12/29/2019   HDL 69 12/29/2019   LDLDIRECT 188.2 01/10/2012   LDLCALC 145 (H) 12/29/2019   ALT 11 12/29/2019   AST 14 12/29/2019   NA 140 12/29/2019   K 3.9 12/29/2019   CL 104 12/29/2019   CREATININE 1.20 (H) 12/29/2019   BUN 19 12/29/2019   CO2 27 12/29/2019   TSH <0.01 (L) 12/29/2019   INR 1.0 08/01/2018    CT RENAL STONE STUDY  Result Date: 08/01/2018 CLINICAL DATA:  Hematuria, loose stools EXAM: CT ABDOMEN AND PELVIS WITHOUT CONTRAST TECHNIQUE: Multidetector CT imaging of the abdomen and pelvis was performed following the  standard protocol without IV contrast. COMPARISON:  12/24/2014 FINDINGS: Lower chest: No acute abnormality. Coronary artery calcifications. Hepatobiliary: No focal liver abnormality is seen. No gallstones, gallbladder wall thickening, or biliary dilatation. Pancreas: Unremarkable. No pancreatic ductal dilatation or surrounding inflammatory changes. Spleen: Normal in size without focal abnormality. Adrenals/Urinary Tract: Adrenal glands are  unremarkable. Kidneys are normal, without renal calculi, focal lesion, or hydronephrosis. Multiple small calculi in the dependent urinary bladder as seen on prior examination. Stomach/Bowel: Stomach is within normal limits. Appendix appears normal. No evidence of bowel wall thickening, distention, or inflammatory changes. Vascular/Lymphatic: Calcific atherosclerosis. No enlarged abdominal or pelvic lymph nodes. Reproductive: No mass or other abnormality. Status post hysterectomy. Other: No abdominal wall hernia or abnormality. No abdominopelvic ascites. Musculoskeletal: No acute or significant osseous findings. IMPRESSION: 1. No acute noncontrast CT findings of the abdomen or pelvis to explain hematuria or loose stools. 2. Multiple small calculi within the dependent urinary bladder, similar to prior study. Electronically Signed   By: Lauralyn Primes M.D.   On: 08/01/2018 15:42    Assessment & Plan:   There are no diagnoses linked to this encounter.   No orders of the defined types were placed in this encounter.    Follow-up: No follow-ups on file.  Sonda Primes, MD

## 2020-01-01 ENCOUNTER — Other Ambulatory Visit: Payer: Self-pay | Admitting: Internal Medicine

## 2020-01-01 DIAGNOSIS — E034 Atrophy of thyroid (acquired): Secondary | ICD-10-CM

## 2020-01-01 MED ORDER — LEVOTHYROXINE SODIUM 137 MCG PO TABS: ORAL_TABLET | ORAL | 3 refills | Status: DC

## 2020-01-01 MED ORDER — NITROFURANTOIN MONOHYD MACRO 100 MG PO CAPS: 100.0000 mg | ORAL_CAPSULE | Freq: Two times a day (BID) | ORAL | 0 refills | Status: AC

## 2020-01-03 NOTE — Assessment & Plan Note (Signed)
-   Spouse at bedside is requesting alprazolam 0.5 mg 3 times daily, p.o. to be ordered as she states patient withdrawals if he does not have this and Ativan is not the same - I explained that we can try however given small bowel obstruction, the alprazolam may not be absorbed as appropriate which would cause patient to withdrawal and put patient at risk for aspiration, spouse appears to understand however still requests that we try and have that scheduled and if he is actively vomiting she will understand if this medication cannot be given - Lorazepam 1 mg IV every 4 hours as needed for anxiety ordered, 4 doses; lorazepam 2 mg IV every 2 hours as needed for seizure, 3 doses ordered 

## 2020-01-03 NOTE — Assessment & Plan Note (Signed)
Depo estradiol injection given

## 2020-01-14 ENCOUNTER — Telehealth: Payer: Self-pay | Admitting: Internal Medicine

## 2020-01-14 ENCOUNTER — Other Ambulatory Visit: Payer: Self-pay | Admitting: Internal Medicine

## 2020-01-14 MED ORDER — METHYLPREDNISOLONE 4 MG PO TBPK
ORAL_TABLET | ORAL | 0 refills | Status: DC
Start: 2020-01-14 — End: 2020-02-01

## 2020-01-14 NOTE — Telephone Encounter (Signed)
   Patient requesting to be seen today for gout left foot, no appointments available. Patient requesting medication for gout be called to pharmacy

## 2020-01-15 ENCOUNTER — Other Ambulatory Visit: Payer: Self-pay

## 2020-01-15 ENCOUNTER — Ambulatory Visit (INDEPENDENT_AMBULATORY_CARE_PROVIDER_SITE_OTHER): Payer: Medicare Other | Admitting: Family

## 2020-01-15 VITALS — BP 158/82 | HR 69 | Temp 97.8°F | Ht 68.0 in | Wt 144.0 lb

## 2020-01-15 DIAGNOSIS — M10072 Idiopathic gout, left ankle and foot: Secondary | ICD-10-CM

## 2020-01-15 MED ORDER — METHYLPREDNISOLONE ACETATE 40 MG/ML IJ SUSP
40.0000 mg | Freq: Once | INTRAMUSCULAR | Status: AC
  Administered 2020-01-15: 40 mg via INTRAMUSCULAR

## 2020-01-15 NOTE — Progress Notes (Signed)
[name] is a 74 y.o. female with the following history as recorded in [employer]:  Patient Active Problem List   Diagnosis Date Noted  . Traumatic hematoma of buttock 12/31/2019  . Right foot pain 11/20/2019  . Bladder prolapse, female, acquired 06/25/2019  . Statin myopathy 04/18/2019  . Dog bite of arm 02/15/2019  . Right arm pain 02/06/2019  . Atrophic vaginitis 10/20/2018  . Urinary bladder stone 08/06/2018  . Hematuria, gross 08/06/2018  . Edema 04/21/2018  . Herpes zoster 02/20/2018  . Occipital neuralgia of left side 01/07/2018  . Hormone replacement therapy (HRT) 07/25/2017  . Hair loss 05/31/2017  . Upper respiratory infection 05/01/2017  . Achilles tendinitis 03/12/2017  . Gout 01/09/2017  . Shoulder effusion, right 12/28/2016  . Neck stiffness 12/28/2016  . Heel pain, chronic, left 12/11/2016  . Calcaneal bursitis (heel), left 11/13/2016  . Toenail avulsion, initial encounter 11/13/2016  . Traumatic hematoma of foot, right, sequela 09/27/2016  . Adnexal mass 01/04/2016  . Right adrenal mass ([facility]) 12/29/2015  . Cataract 10/10/2015  . Abnormal abdominal x-ray 12/23/2014  . GERD (gastroesophageal reflux disease) 11/07/2012  . Hot flash, menopausal 09/10/2012  . Well adult exam 06/13/2011  . GAIT DISTURBANCE 11/22/2009  . PARESTHESIA 11/22/2009  . BACK PAIN, LUMBAR 11/03/2009  . Hypothyroidism 02/11/2007  . Dyslipidemia 02/11/2007  . Chronic anxiety 02/11/2007  . Depression 02/11/2007  . Essential hypertension 02/11/2007  . Coronary atherosclerosis 02/11/2007  . 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 CAROTID BRUIT 02/11/2007    Current Outpatient Medications  Medication Sig Dispense Refill  . aspirin 81 MG EC tablet Take 81 mg by mouth daily.      . Cholecalciferol 1000 UNITS tablet Take 1,000 Units by mouth daily.      . clobetasol ointment (TEMOVATE) 0.05 % Apply 1 application topically 2 (two) times daily. Apply as directed twice daily for up to 2  weeks, then space out to 1-2 x a week. Not for daily long term use. 30 g 0  . clorazepate (TRANXENE) 15 MG tablet Take 1 tablet (15 mg total) by mouth 3 (three) times daily as needed. for anxiety 270 tablet 1  . doxepin (SINEQUAN) 50 MG capsule Take 3 capsules (150 mg total) by mouth at bedtime. 270 capsule 3  . estradiol (ESTRACE) 0.1 MG/GM vaginal cream Place 1 Applicatorful vaginally at bedtime as needed. 42.5 g 0  . estradiol cypionate (DEPO-ESTRADIOL) 5 MG/ML injection Use 0.3 ml every 18-25 days IM 5 mL 3  . furosemide (LASIX) 40 MG tablet Take 1 tablet (40 mg total) by mouth 2 (two) times daily. 180 tablet 3  . levothyroxine (SYNTHROID) 137 MCG tablet TAKE 1 TABLET BY MOUTH ONCE DAILY BEFORE BREAKFAST every other day and alternate with one half every other day 90 tablet 3  . LORazepam (ATIVAN) 0.5 MG tablet TAKE 1 TABLET BY MOUTH AT BEDTIME 90 tablet 1  . methylPREDNISolone (MEDROL DOSEPAK) 4 MG TBPK tablet As directed 21 tablet 0  . metoprolol tartrate (LOPRESSOR) 50 MG tablet Take 1 tablet (50 mg total) by mouth 3 (three) times daily. 270 tablet 3  . NIFEdipine (PROCARDIA-XL/NIFEDICAL-XL) 30 MG 24 hr tablet Take 1 tablet (30 mg total) by mouth daily. 90 tablet 3  . nitroGLYCERIN (NITROLINGUAL) 0.4 MG/SPRAY spray PLACE 1 SPRAY UNDER THE TONGUE EVERY 5 MINUTES AS NEEDED FOR CHEST PAIN. MAY REPEAT 3 TIMES 12 g 1  . potassium chloride SA (KLOR-CON M20) 20 MEQ tablet Take 1 tablet (20 mEq total)  by mouth 2 (two) times daily. 180 tablet 3   Current Facility-Administered Medications  Medication Dose Route Frequency Provider Last Rate Last Admin  . estradiol cypionate (DEPO-ESTRADIOL) 5 MG/ML injection 1.5 mg  1.5 mg Intramuscular Q30 days Plotnikov, Georgina Quint, MD   1.5 mg at 10/07/19 1649    Allergies: Epinephrine, Isosorbide mononitrate, Lidocaine, Morphine, Propoxyphene n-acetaminophen, Alprazolam, Atorvastatin, Codeine, Ezetimibe, Prilosec [omeprazole], Rosuvastatin, Simvastatin, Allopurinol,  Meloxicam, Tetracycline, Triple antibiotic [bacitracin-neomycin-polymyxin], Amoxicillin, Ciprofloxacin, and Diphenhydramine hcl  Past Medical History:  Diagnosis Date  . Anxiety state, unspecified   . Bronchitis, acute   . Carotid bruit   . Coronary atherosclerosis of unspecified type of vessel, native or graft    multiple angioplasy procedures in 90's/early 2000's  . De Quervain's tenosynovitis   . Depressive disorder, not elsewhere classified   . Female bladder prolapse   . Lichen 2011   Vaginal  . Other and unspecified hyperlipidemia   . Unspecified essential hypertension   . Unspecified hypothyroidism     Past Surgical History:  Procedure Laterality Date  . MOUTH SURGERY    . NOSE SURGERY  1999  . TOTAL VAGINAL HYSTERECTOMY  1994    Family History  Problem Relation Age of Onset  . Hypertension Mother   . Hypertension Father   . Hypertension Other   . Coronary artery disease Other     Social History   Tobacco Use  . Smoking status: Former Smoker    Quit date: 07/22/1991    Years since quitting: 28.5  . Smokeless tobacco: Never Used  Substance Use Topics  . Alcohol use: No    Subjective:  Patient started with a gout flare in her left foot yesterday; her PCP has already called in a prescription for oral prednisone but patient is requesting shot of steroid as well; Notes she does get a gout flare 5-6 x per year;     Objective:  Vitals:   01/15/20 1528  BP: (!) 158/82  Pulse: 69  Temp: 97.8 F (36.6 C)  SpO2: 97%  Weight: 144 lb (65.3 kg)  Height: 5\' 8"  (1.727 m)    General: Well developed, well nourished, in no acute distress  Skin : Warm and dry.  Head: Normocephalic and atraumatic  Lungs: Respirations unlabored;  Musculoskeletal: No deformities; localized redness/ swelling noted at base of 1st toe left foot  Extremities: No edema, cyanosis, clubbing  Vessels: Symmetric bilaterally  Neurologic: Alert and oriented; speech intact; face symmetrical; moves  all extremities well; CNII-XII intact without focal deficit   Assessment:  1. Idiopathic gout of left foot, unspecified chronicity     Plan:  Depo-Medrol IM 40 mg given in office; she has already been prescribed oral prednisone by her PCP and will plan to start this tomorrow; Follow up with her PCP otherwise.  This visit occurred during the SARS-CoV-2 public health emergency.  Safety protocols were in place, including screening questions prior to the visit, additional usage of staff PPE, and extensive cleaning of exam room while observing appropriate contact time as indicated for disinfecting solutions.      No follow-ups on file.  No orders of the defined types were placed in this encounter.   Requested Prescriptions    No prescriptions requested or ordered in this encounter

## 2020-01-15 NOTE — Telephone Encounter (Signed)
Pt has an OV with Vernona Rieger today

## 2020-01-27 ENCOUNTER — Other Ambulatory Visit: Payer: Self-pay

## 2020-01-27 ENCOUNTER — Ambulatory Visit (INDEPENDENT_AMBULATORY_CARE_PROVIDER_SITE_OTHER): Payer: Medicare Other | Admitting: Family

## 2020-01-27 VITALS — BP 150/80 | HR 75 | Temp 97.9°F | Ht 68.0 in | Wt 144.0 lb

## 2020-01-27 DIAGNOSIS — M25571 Pain in right ankle and joints of right foot: Secondary | ICD-10-CM

## 2020-01-27 MED ORDER — METHYLPREDNISOLONE ACETATE 40 MG/ML IJ SUSP
40.0000 mg | Freq: Once | INTRAMUSCULAR | Status: AC
  Administered 2020-01-27: 40 mg via INTRAMUSCULAR

## 2020-01-27 NOTE — Progress Notes (Signed)
[name] is a 74 y.o. female with the following history as recorded in [employer]:  Patient Active Problem List   Diagnosis Date Noted  . Traumatic hematoma of buttock 12/31/2019  . Right foot pain 11/20/2019  . Bladder prolapse, female, acquired 06/25/2019  . Statin myopathy 04/18/2019  . Dog bite of arm 02/15/2019  . Right arm pain 02/06/2019  . Atrophic vaginitis 10/20/2018  . Urinary bladder stone 08/06/2018  . Hematuria, gross 08/06/2018  . Edema 04/21/2018  . Herpes zoster 02/20/2018  . Occipital neuralgia of left side 01/07/2018  . Hormone replacement therapy (HRT) 07/25/2017  . Hair loss 05/31/2017  . Upper respiratory infection 05/01/2017  . Achilles tendinitis 03/12/2017  . Gout 01/09/2017  . Shoulder effusion, right 12/28/2016  . Neck stiffness 12/28/2016  . Heel pain, chronic, left 12/11/2016  . Calcaneal bursitis (heel), left 11/13/2016  . Toenail avulsion, initial encounter 11/13/2016  . Traumatic hematoma of foot, right, sequela 09/27/2016  . Adnexal mass 01/04/2016  . Right adrenal mass (HCC) 12/29/2015  . Cataract 10/10/2015  . Abnormal abdominal x-ray 12/23/2014  . GERD (gastroesophageal reflux disease) 11/07/2012  . Hot flash, menopausal 09/10/2012  . Well adult exam 06/13/2011  . GAIT DISTURBANCE 11/22/2009  . PARESTHESIA 11/22/2009  . BACK PAIN, LUMBAR 11/03/2009  . Hypothyroidism 02/11/2007  . Dyslipidemia 02/11/2007  . Chronic anxiety 02/11/2007  . Depression 02/11/2007  . Essential hypertension 02/11/2007  . Coronary atherosclerosis 02/11/2007  . 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 CAROTID BRUIT 02/11/2007    Current Outpatient Medications  Medication Sig Dispense Refill  . aspirin 81 MG EC tablet Take 81 mg by mouth daily.      . Cholecalciferol 1000 UNITS tablet Take 1,000 Units by mouth daily.      . clobetasol ointment (TEMOVATE) 0.05 % Apply 1 application topically 2 (two) times daily. Apply as directed twice daily for up to 2  weeks, then space out to 1-2 x a week. Not for daily long term use. 30 g 0  . clorazepate (TRANXENE) 15 MG tablet Take 1 tablet (15 mg total) by mouth 3 (three) times daily as needed. for anxiety 270 tablet 1  . doxepin (SINEQUAN) 50 MG capsule Take 3 capsules (150 mg total) by mouth at bedtime. 270 capsule 3  . estradiol (ESTRACE) 0.1 MG/GM vaginal cream Place 1 Applicatorful vaginally at bedtime as needed. 42.5 g 0  . estradiol cypionate (DEPO-ESTRADIOL) 5 MG/ML injection Use 0.3 ml every 18-25 days IM 5 mL 3  . furosemide (LASIX) 40 MG tablet Take 1 tablet (40 mg total) by mouth 2 (two) times daily. 180 tablet 3  . levothyroxine (SYNTHROID) 137 MCG tablet TAKE 1 TABLET BY MOUTH ONCE DAILY BEFORE BREAKFAST every other day and alternate with one half every other day 90 tablet 3  . LORazepam (ATIVAN) 0.5 MG tablet TAKE 1 TABLET BY MOUTH AT BEDTIME 90 tablet 1  . methylPREDNISolone (MEDROL DOSEPAK) 4 MG TBPK tablet As directed 21 tablet 0  . metoprolol tartrate (LOPRESSOR) 50 MG tablet Take 1 tablet (50 mg total) by mouth 3 (three) times daily. 270 tablet 3  . NIFEdipine (PROCARDIA-XL/NIFEDICAL-XL) 30 MG 24 hr tablet Take 1 tablet (30 mg total) by mouth daily. 90 tablet 3  . nitroGLYCERIN (NITROLINGUAL) 0.4 MG/SPRAY spray PLACE 1 SPRAY UNDER THE TONGUE EVERY 5 MINUTES AS NEEDED FOR CHEST PAIN. MAY REPEAT 3 TIMES 12 g 1  . potassium chloride SA (KLOR-CON M20) 20 MEQ tablet Take 1 tablet (20 mEq total)  by mouth 2 (two) times daily. 180 tablet 3   Current Facility-Administered Medications  Medication Dose Route Frequency Provider Last Rate Last Admin  . estradiol cypionate (DEPO-ESTRADIOL) 5 MG/ML injection 1.5 mg  1.5 mg Intramuscular Q30 days Plotnikov, Georgina Quint, MD   1.5 mg at 10/07/19 1649    Allergies: Epinephrine, Isosorbide mononitrate, Lidocaine, Morphine, Propoxyphene n-acetaminophen, Alprazolam, Atorvastatin, Codeine, Ezetimibe, Prilosec [omeprazole], Rosuvastatin, Simvastatin, Allopurinol,  Meloxicam, Tetracycline, Triple antibiotic [bacitracin-neomycin-polymyxin], Amoxicillin, Ciprofloxacin, and Diphenhydramine hcl  Past Medical History:  Diagnosis Date  . Anxiety state, unspecified   . Bronchitis, acute   . Carotid bruit   . Coronary atherosclerosis of unspecified type of vessel, native or graft    multiple angioplasy procedures in 90's/early 2000's  . De Quervain's tenosynovitis   . Depressive disorder, not elsewhere classified   . Female bladder prolapse   . Lichen 2011   Vaginal  . Other and unspecified hyperlipidemia   . Unspecified essential hypertension   . Unspecified hypothyroidism     Past Surgical History:  Procedure Laterality Date  . MOUTH SURGERY    . NOSE SURGERY  1999  . TOTAL VAGINAL HYSTERECTOMY  1994    Family History  Problem Relation Age of Onset  . Hypertension Mother   . Hypertension Father   . Hypertension Other   . Coronary artery disease Other     Social History   Tobacco Use  . Smoking status: Former Smoker    Quit date: 07/22/1991    Years since quitting: 28.5  . Smokeless tobacco: Never Used  Substance Use Topics  . Alcohol use: No    Subjective:  Left gout flare occurred on 8/26- seen on 8/27 and given Medrol Dose pak and steroid injection; left foot cleared; Notes that 4 days ago, right foot became so swollen that it hurt to bear weight but no history of injury or swelling;     Objective:  Vitals:   01/27/20 1131  BP: (!) 150/80  Pulse: 75  Temp: 97.9 F (36.6 C)  TempSrc: Oral  SpO2: 97%  Weight: 144 lb (65.3 kg)  Height: 5\' 8"  (1.727 m)    General: Well developed, well nourished, in no acute distress  Skin : Warm and dry.  Head: Normocephalic and atraumatic  Lungs: Respirations unlabored; clear to auscultation bilaterally without wheeze, rales, rhonchi  Musculoskeletal: No deformities; marked swelling over right foot/ ankle; Extremities: No edema, cyanosis, clubbing  Vessels: Symmetric bilaterally   Neurologic: Alert and oriented; speech intact; face symmetrical; moves all extremities well; CNII-XII intact without focal deficit  Assessment:  1. Acute right ankle pain     Plan:  Discussed updating X-rays today and patient defers due to cost concerns; prefers to treat for gout flare and agrees to follow-up for X-ray if symptoms persist; Depo-Medrol IM 40 mg given in office; she will call back with response;  This visit occurred during the SARS-CoV-2 public health emergency.  Safety protocols were in place, including screening questions prior to the visit, additional usage of staff PPE, and extensive cleaning of exam room while observing appropriate contact time as indicated for disinfecting solutions.     No follow-ups on file.  Orders Placed This Encounter  Procedures  . DG Ankle Complete Right    Standing Status:   Future    Standing Expiration Date:   01/26/2021    Order Specific Question:   Reason for Exam (SYMPTOM  OR DIAGNOSIS REQUIRED)    Answer:   right ankle pain  Order Specific Question:   Preferred imaging location?    Answer:   [facility]    Order Specific Question:   Radiology Contrast Protocol - do NOT remove file path    Answer:   [email or web address]  . DG Foot Complete Right    Standing Status:   Future    Standing Expiration Date:   01/26/2021    Order Specific Question:   Reason for Exam (SYMPTOM  OR DIAGNOSIS REQUIRED)    Answer:   right foot pain/ swelling    Order Specific Question:   Preferred imaging location?    Answer:   [facility]    Order Specific Question:   Radiology Contrast Protocol - do NOT remove file path    Answer:   [email or web address]    Requested Prescriptions    No prescriptions requested or ordered in this encounter

## 2020-02-01 ENCOUNTER — Encounter: Payer: Self-pay | Admitting: Internal Medicine

## 2020-02-01 ENCOUNTER — Ambulatory Visit (INDEPENDENT_AMBULATORY_CARE_PROVIDER_SITE_OTHER): Payer: Medicare Other | Admitting: Internal Medicine

## 2020-02-01 ENCOUNTER — Other Ambulatory Visit: Payer: Self-pay

## 2020-02-01 VITALS — BP 190/88 | HR 84 | Temp 97.8°F | Ht 68.0 in | Wt 140.0 lb

## 2020-02-01 DIAGNOSIS — N951 Menopausal and female climacteric states: Secondary | ICD-10-CM

## 2020-02-01 DIAGNOSIS — I10 Essential (primary) hypertension: Secondary | ICD-10-CM | POA: Diagnosis not present

## 2020-02-01 DIAGNOSIS — S300XXA Contusion of lower back and pelvis, initial encounter: Secondary | ICD-10-CM

## 2020-02-01 DIAGNOSIS — G720 Drug-induced myopathy: Secondary | ICD-10-CM | POA: Diagnosis not present

## 2020-02-01 DIAGNOSIS — E034 Atrophy of thyroid (acquired): Secondary | ICD-10-CM | POA: Diagnosis not present

## 2020-02-01 DIAGNOSIS — M10072 Idiopathic gout, left ankle and foot: Secondary | ICD-10-CM

## 2020-02-01 DIAGNOSIS — I2510 Atherosclerotic heart disease of native coronary artery without angina pectoris: Secondary | ICD-10-CM

## 2020-02-01 DIAGNOSIS — Z79890 Hormone replacement therapy (postmenopausal): Secondary | ICD-10-CM

## 2020-02-01 DIAGNOSIS — E785 Hyperlipidemia, unspecified: Secondary | ICD-10-CM

## 2020-02-01 DIAGNOSIS — T466X5A Adverse effect of antihyperlipidemic and antiarteriosclerotic drugs, initial encounter: Secondary | ICD-10-CM

## 2020-02-01 MED ORDER — METHYLPREDNISOLONE 4 MG PO TBPK
ORAL_TABLET | ORAL | 0 refills | Status: DC
Start: 2020-02-01 — End: 2020-04-21

## 2020-02-01 MED ORDER — ESTRADIOL CYPIONATE 5 MG/ML IM OIL
1.5000 mg | TOPICAL_OIL | Freq: Once | INTRAMUSCULAR | Status: AC
  Administered 2020-02-01: 1.5 mg via INTRAMUSCULAR

## 2020-02-01 NOTE — Assessment & Plan Note (Addendum)
Repatha discussed Repatha discussed - refused Pt refused COVID 19 vaccine

## 2020-02-01 NOTE — Assessment & Plan Note (Signed)
Resolving

## 2020-02-01 NOTE — Assessment & Plan Note (Signed)
Chronic HRT estradiol injections for severe hot flashes Depo-Estradiol 0.3 ml  Potential benefits of a long term HRT  use as well as potential risks  and complications were explained to the patient and were aknowledged. 

## 2020-02-01 NOTE — Addendum Note (Signed)
Addended by: Marshell Garfinkel on: 02/01/2020 03:25 PM   Modules accepted: Orders

## 2020-02-01 NOTE — Assessment & Plan Note (Signed)
Repatha discussed

## 2020-02-01 NOTE — Patient Instructions (Signed)
Evolocumab injection What is this medicine? EVOLOCUMAB (e voe LOK ue mab) is known as a PCSK9 inhibitor. It is used to lower the level of cholesterol in the blood. It may be used alone or in combination with other cholesterol-lowering drugs. This drug may also be used to reduce the risk of heart attack, stroke, and certain types of heart surgery in patients with heart disease. This medicine may be used for other purposes; ask your health care provider or pharmacist if you have questions. COMMON BRAND NAME(S): Repatha What should I tell my health care provider before I take this medicine? They need to know if you have any of these conditions:  an unusual or allergic reaction to evolocumab, other medicines, latex, foods, dyes, or preservatives  pregnant or trying to get pregnant  breast-feeding How should I use this medicine? This medicine is for injection under the skin. You will be taught how to prepare and give this medicine. Use exactly as directed. Take your medicine at regular intervals. Do not take your medicine more often than directed. It is important that you put your used needles and syringes in a special sharps container. Do not put them in a trash can. If you do not have a sharps container, call your pharmacist or health care provider to get one. Talk to your pediatrician regarding the use of this medicine in children. While this drug may be prescribed for children as young as 13 years for selected conditions, precautions do apply. Overdosage: If you think you have taken too much of this medicine contact a poison control center or emergency room at once. NOTE: This medicine is only for you. Do not share this medicine with others. What if I miss a dose? If you miss a dose, take it as soon as you can if there are more than 7 days until the next scheduled dose, or skip the missed dose and take the next dose according to your original schedule. Do not take double or extra doses. What may  interact with this medicine? Interactions are not expected. This list may not describe all possible interactions. Give your health care provider a list of all the medicines, herbs, non-prescription drugs, or dietary supplements you use. Also tell them if you smoke, drink alcohol, or use illegal drugs. Some items may interact with your medicine. What should I watch for while using this medicine? Visit your health care provider for regular checks on your progress. Tell your health care provider if your symptoms do not start to get better or if they get worse. You may need blood work done while you are taking this drug. Do not wear the on-body infuser during an MRI. What side effects may I notice from receiving this medicine? Side effects that you should report to your doctor or health care professional as soon as possible:  allergic reactions like skin rash, itching or hives, swelling of the face, lips, or tongue  signs and symptoms of high blood sugar such as dizziness; dry mouth; dry skin; fruity breath; nausea; stomach pain; increased hunger or thirst; increased urination  signs and symptoms of infection like fever or chills; cough; sore throat; pain or trouble passing urine Side effects that usually do not require medical attention (report to your doctor or health care professional if they continue or are bothersome):  diarrhea  nausea  muscle pain  pain, redness, or irritation at site where injected This list may not describe all possible side effects. Call your doctor for medical   advice about side effects. You may report side effects to FDA at 1-800-FDA-1088. Where should I keep my medicine? Keep out of the reach of children. You will be instructed on how to store this medicine. Throw away any unused medicine after the expiration date on the label. NOTE: This sheet is a summary. It may not cover all possible information. If you have questions about this medicine, talk to your doctor,  pharmacist, or health care provider.  2020 Elsevier/Gold Standard (2019-03-10 16:22:29)  

## 2020-02-01 NOTE — Assessment & Plan Note (Addendum)
Toprol, Nifedipine BP ok at home

## 2020-02-01 NOTE — Progress Notes (Signed)
Subjective:  Patient ID: [name], female    DOB: Dec 13, 1945  Age: 74 y.o. MRN: [id number]  CC: No chief complaint on file.   HPI [name] presents for HRT, CAD, dyslipidemia, hematoma f/u C/o R ankle pain  Laterally - much better after Depo-medrol IM  Pt refused COVID 19 vaccine  Outpatient Medications Prior to Visit  Medication Sig Dispense Refill  . aspirin 81 MG EC tablet Take 81 mg by mouth daily.      . Cholecalciferol 1000 UNITS tablet Take 1,000 Units by mouth daily.      . clobetasol ointment (TEMOVATE) 0.05 % Apply 1 application topically 2 (two) times daily. Apply as directed twice daily for up to 2 weeks, then space out to 1-2 x a week. Not for daily long term use. 30 g 0  . clorazepate (TRANXENE) 15 MG tablet Take 1 tablet (15 mg total) by mouth 3 (three) times daily as needed. for anxiety 270 tablet 1  . doxepin (SINEQUAN) 50 MG capsule Take 3 capsules (150 mg total) by mouth at bedtime. 270 capsule 3  . estradiol (ESTRACE) 0.1 MG/GM vaginal cream Place 1 Applicatorful vaginally at bedtime as needed. 42.5 g 0  . estradiol cypionate (DEPO-ESTRADIOL) 5 MG/ML injection Use 0.3 ml every 18-25 days IM 5 mL 3  . furosemide (LASIX) 40 MG tablet Take 1 tablet (40 mg total) by mouth 2 (two) times daily. 180 tablet 3  . levothyroxine (SYNTHROID) 137 MCG tablet TAKE 1 TABLET BY MOUTH ONCE DAILY BEFORE BREAKFAST every other day and alternate with one half every other day 90 tablet 3  . LORazepam (ATIVAN) 0.5 MG tablet TAKE 1 TABLET BY MOUTH AT BEDTIME 90 tablet 1  . methylPREDNISolone (MEDROL DOSEPAK) 4 MG TBPK tablet As directed 21 tablet 0  . metoprolol tartrate (LOPRESSOR) 50 MG tablet Take 1 tablet (50 mg total) by mouth 3 (three) times daily. 270 tablet 3  . NIFEdipine (PROCARDIA-XL/NIFEDICAL-XL) 30 MG 24 hr tablet Take 1 tablet (30 mg total) by mouth daily. 90 tablet 3  . nitroGLYCERIN (NITROLINGUAL) 0.4 MG/SPRAY spray PLACE 1 SPRAY UNDER THE TONGUE EVERY 5 MINUTES AS  NEEDED FOR CHEST PAIN. MAY REPEAT 3 TIMES 12 g 1  . potassium chloride SA (KLOR-CON M20) 20 MEQ tablet Take 1 tablet (20 mEq total) by mouth 2 (two) times daily. 180 tablet 3   Facility-Administered Medications Prior to Visit  Medication Dose Route Frequency Provider Last Rate Last Admin  . estradiol cypionate (DEPO-ESTRADIOL) 5 MG/ML injection 1.5 mg  1.5 mg Intramuscular Q30 days Eryca Bolte, Georgina Quint, MD   1.5 mg at 10/07/19 1649    ROS: Review of Systems  Constitutional: Positive for diaphoresis. Negative for activity change, appetite change, chills, fatigue and unexpected weight change.  HENT: Negative for congestion, mouth sores and sinus pressure.   Eyes: Negative for visual disturbance.  Respiratory: Negative for cough and chest tightness.   Gastrointestinal: Negative for abdominal pain and nausea.  Genitourinary: Negative for difficulty urinating, frequency and vaginal pain.  Musculoskeletal: Negative for back pain and gait problem.  Skin: Negative for pallor and rash.  Neurological: Negative for dizziness, tremors, weakness, numbness and headaches.  Psychiatric/Behavioral: Negative for confusion and sleep disturbance.    Objective:  BP (!) 190/88 (BP Location: Left Arm, Patient Position: Sitting, Cuff Size: Normal)   Pulse 84   Temp 97.8 F (36.6 C) (Oral)   Ht 5\' 8"  (1.727 m)   Wt 140 lb (63.5 kg)  SpO2 97%   BMI 21.29 kg/m   BP Readings from Last 3 Encounters:  02/01/20 (!) 190/88  01/27/20 (!) 150/80  01/15/20 (!) 158/82    Wt Readings from Last 3 Encounters:  02/01/20 140 lb (63.5 kg)  01/27/20 144 lb (65.3 kg)  01/15/20 144 lb (65.3 kg)    Physical Exam Constitutional:      General: She is not in acute distress.    Appearance: She is well-developed.  HENT:     Head: Normocephalic.     Right Ear: External ear normal.     Left Ear: External ear normal.     Nose: Nose normal.  Eyes:     General:        Right eye: No discharge.        Left eye: No  discharge.     Conjunctiva/sclera: Conjunctivae normal.     Pupils: Pupils are equal, round, and reactive to light.  Neck:     Thyroid: No thyromegaly.     Vascular: No JVD.     Trachea: No tracheal deviation.  Cardiovascular:     Rate and Rhythm: Normal rate and regular rhythm.     Heart sounds: Normal heart sounds.  Pulmonary:     Effort: No respiratory distress.     Breath sounds: No stridor. No wheezing.  Abdominal:     General: Bowel sounds are normal. There is no distension.     Palpations: Abdomen is soft. There is no mass.     Tenderness: There is no abdominal tenderness. There is no guarding or rebound.  Musculoskeletal:        General: No tenderness.     Cervical back: Normal range of motion and neck supple.  Lymphadenopathy:     Cervical: No cervical adenopathy.  Skin:    Findings: No erythema or rash.  Neurological:     Mental Status: She is oriented to person, place, and time.     Cranial Nerves: No cranial nerve deficit.     Motor: No abnormal muscle tone.     Coordination: Coordination normal.     Deep Tendon Reflexes: Reflexes normal.  Psychiatric:        Behavior: Behavior normal.        Thought Content: Thought content normal.        Judgment: Judgment normal.   R ankle is swollen laterally Tearful, stressed   Lab Results  Component Value Date   WBC 5.6 12/29/2019   HGB 12.3 12/29/2019   HCT 37.0 12/29/2019   PLT 268 12/29/2019   GLUCOSE 93 12/29/2019   CHOL 235 (H) 12/29/2019   TRIG 100 12/29/2019   HDL 69 12/29/2019   LDLDIRECT 188.2 01/10/2012   LDLCALC 145 (H) 12/29/2019   ALT 11 12/29/2019   AST 14 12/29/2019   NA 140 12/29/2019   K 3.9 12/29/2019   CL 104 12/29/2019   CREATININE 1.20 (H) 12/29/2019   BUN 19 12/29/2019   CO2 27 12/29/2019   TSH <0.01 (L) 12/29/2019   INR 1.0 08/01/2018    CT RENAL STONE STUDY  Result Date: 08/01/2018 CLINICAL DATA:  Hematuria, loose stools EXAM: CT ABDOMEN AND PELVIS WITHOUT CONTRAST TECHNIQUE:  Multidetector CT imaging of the abdomen and pelvis was performed following the standard protocol without IV contrast. COMPARISON:  12/24/2014 FINDINGS: Lower chest: No acute abnormality. Coronary artery calcifications. Hepatobiliary: No focal liver abnormality is seen. No gallstones, gallbladder wall thickening, or biliary dilatation. Pancreas: Unremarkable. No pancreatic ductal dilatation or  surrounding inflammatory changes. Spleen: Normal in size without focal abnormality. Adrenals/Urinary Tract: Adrenal glands are unremarkable. Kidneys are normal, without renal calculi, focal lesion, or hydronephrosis. Multiple small calculi in the dependent urinary bladder as seen on prior examination. Stomach/Bowel: Stomach is within normal limits. Appendix appears normal. No evidence of bowel wall thickening, distention, or inflammatory changes. Vascular/Lymphatic: Calcific atherosclerosis. No enlarged abdominal or pelvic lymph nodes. Reproductive: No mass or other abnormality. Status post hysterectomy. Other: No abdominal wall hernia or abnormality. No abdominopelvic ascites. Musculoskeletal: No acute or significant osseous findings. IMPRESSION: 1. No acute noncontrast CT findings of the abdomen or pelvis to explain hematuria or loose stools. 2. Multiple small calculi within the dependent urinary bladder, similar to prior study. Electronically Signed   By: Lauralyn Primes M.D.   On: 08/01/2018 15:42    Assessment & Plan:      Sonda Primes, MD

## 2020-02-01 NOTE — Assessment & Plan Note (Addendum)
Repatha discussed - refused

## 2020-02-01 NOTE — Assessment & Plan Note (Signed)
Chronic HRT estradiol injections for severe hot flashes Depo-Estradiol 0.3 ml  Potential benefits of a long term HRT  use as well as potential risks  and complications were explained to the patient and were aknowledged.

## 2020-02-01 NOTE — Assessment & Plan Note (Signed)
On levothroid 

## 2020-02-23 ENCOUNTER — Other Ambulatory Visit: Payer: Self-pay | Admitting: Internal Medicine

## 2020-02-25 ENCOUNTER — Ambulatory Visit (INDEPENDENT_AMBULATORY_CARE_PROVIDER_SITE_OTHER): Payer: Medicare Other | Admitting: Internal Medicine

## 2020-02-25 ENCOUNTER — Other Ambulatory Visit: Payer: Self-pay

## 2020-02-25 ENCOUNTER — Encounter: Payer: Self-pay | Admitting: Internal Medicine

## 2020-02-25 DIAGNOSIS — N951 Menopausal and female climacteric states: Secondary | ICD-10-CM | POA: Diagnosis not present

## 2020-02-25 DIAGNOSIS — F329 Major depressive disorder, single episode, unspecified: Secondary | ICD-10-CM

## 2020-02-25 DIAGNOSIS — I2510 Atherosclerotic heart disease of native coronary artery without angina pectoris: Secondary | ICD-10-CM

## 2020-02-25 DIAGNOSIS — M10072 Idiopathic gout, left ankle and foot: Secondary | ICD-10-CM | POA: Diagnosis not present

## 2020-02-25 DIAGNOSIS — F419 Anxiety disorder, unspecified: Secondary | ICD-10-CM | POA: Diagnosis not present

## 2020-02-25 DIAGNOSIS — F5101 Primary insomnia: Secondary | ICD-10-CM | POA: Diagnosis not present

## 2020-02-25 DIAGNOSIS — G4700 Insomnia, unspecified: Secondary | ICD-10-CM | POA: Insufficient documentation

## 2020-02-25 DIAGNOSIS — E785 Hyperlipidemia, unspecified: Secondary | ICD-10-CM

## 2020-02-25 MED ORDER — CLORAZEPATE DIPOTASSIUM 15 MG PO TABS: 15.0000 mg | ORAL_TABLET | Freq: Two times a day (BID) | ORAL | 0 refills | Status: DC | PRN

## 2020-02-25 NOTE — Assessment & Plan Note (Signed)
Doxepin at hs 

## 2020-02-25 NOTE — Assessment & Plan Note (Signed)
Lorazepam prn at hs only, Doxepine at hs Pt taking meds at night (not when driving). Tranzene - rare use  Potential benefits of a long term benzodiazepines  use as well as potential risks  and complications were explained to the patient and were aknowledged.

## 2020-02-25 NOTE — Assessment & Plan Note (Signed)
Toprol, Procardia Statin intol

## 2020-02-25 NOTE — Assessment & Plan Note (Signed)
Not relapse

## 2020-02-25 NOTE — Assessment & Plan Note (Signed)
Lorazepam prn at hs 

## 2020-02-25 NOTE — Assessment & Plan Note (Signed)
Statin intolerant 

## 2020-02-25 NOTE — Progress Notes (Signed)
Subjective:  Patient ID: [name], female    DOB: 1946-04-26  Age: 74 y.o. MRN: [id number]  CC: No chief complaint on file.   HPI [name] presents for HTN, CAD, sweats, gout f/u  Outpatient Medications Prior to Visit  Medication Sig Dispense Refill  . aspirin 81 MG EC tablet Take 81 mg by mouth daily.      . Cholecalciferol 1000 UNITS tablet Take 1,000 Units by mouth daily.      . clobetasol ointment (TEMOVATE) 0.05 % Apply 1 application topically 2 (two) times daily. Apply as directed twice daily for up to 2 weeks, then space out to 1-2 x a week. Not for daily long term use. 30 g 0  . clorazepate (TRANXENE) 15 MG tablet Take 1 tablet (15 mg total) by mouth 3 (three) times daily as needed. for anxiety 270 tablet 1  . doxepin (SINEQUAN) 50 MG capsule Take 3 capsules (150 mg total) by mouth at bedtime. 270 capsule 3  . estradiol (ESTRACE) 0.1 MG/GM vaginal cream Place 1 Applicatorful vaginally at bedtime as needed. 42.5 g 0  . estradiol cypionate (DEPO-ESTRADIOL) 5 MG/ML injection Use 0.3 ml every 18-25 days IM 5 mL 3  . furosemide (LASIX) 40 MG tablet Take 1 tablet (40 mg total) by mouth 2 (two) times daily. 180 tablet 3  . levothyroxine (SYNTHROID) 137 MCG tablet TAKE 1 TABLET BY MOUTH ONCE DAILY BEFORE BREAKFAST every other day and alternate with one half every other day 90 tablet 3  . LORazepam (ATIVAN) 0.5 MG tablet TAKE 1 TABLET BY MOUTH AT BEDTIME 90 tablet 1  . methylPREDNISolone (MEDROL DOSEPAK) 4 MG TBPK tablet As directed 21 tablet 0  . metoprolol tartrate (LOPRESSOR) 50 MG tablet Take 1 tablet (50 mg total) by mouth 3 (three) times daily. 270 tablet 3  . NIFEdipine (PROCARDIA-XL/NIFEDICAL-XL) 30 MG 24 hr tablet Take 1 tablet (30 mg total) by mouth daily. 90 tablet 3  . nitroGLYCERIN (NITROLINGUAL) 0.4 MG/SPRAY spray PLACE 1 SPRAY UNDER THE TONGUE EVERY 5 MINUTES AS NEEDED FOR CHEST PAIN. MAY REPEAT 3 TIMES 12 g 1  . potassium chloride SA (KLOR-CON M20) 20 MEQ tablet Take  1 tablet (20 mEq total) by mouth 2 (two) times daily. 180 tablet 3   Facility-Administered Medications Prior to Visit  Medication Dose Route Frequency Provider Last Rate Last Admin  . estradiol cypionate (DEPO-ESTRADIOL) 5 MG/ML injection 1.5 mg  1.5 mg Intramuscular Q30 days Mitsy Owen, Georgina Quint, MD   1.5 mg at 10/07/19 1649    ROS: Review of Systems  Constitutional: Negative for activity change, appetite change, chills, fatigue and unexpected weight change.  HENT: Negative for congestion, mouth sores and sinus pressure.   Eyes: Negative for visual disturbance.  Respiratory: Negative for cough and chest tightness.   Gastrointestinal: Negative for abdominal pain and nausea.  Genitourinary: Negative for difficulty urinating, frequency and vaginal pain.  Musculoskeletal: Positive for arthralgias. Negative for back pain and gait problem.  Skin: Negative for pallor and rash.  Neurological: Negative for dizziness, tremors, weakness, numbness and headaches.  Psychiatric/Behavioral: Negative for confusion, sleep disturbance and suicidal ideas. The patient is nervous/anxious.     Objective:  BP (!) 148/72 (BP Location: Left Arm, Patient Position: Sitting, Cuff Size: Normal)   Pulse 61   Temp 98.1 F (36.7 C) (Oral)   Ht 5\' 8"  (1.727 m)   Wt 143 lb (64.9 kg)   SpO2 96%   BMI 21.74 kg/m   BP Readings  from Last 3 Encounters:  02/25/20 (!) 148/72  02/01/20 (!) 190/88  01/27/20 (!) 150/80    Wt Readings from Last 3 Encounters:  02/25/20 143 lb (64.9 kg)  02/01/20 140 lb (63.5 kg)  01/27/20 144 lb (65.3 kg)    Physical Exam Constitutional:      General: She is not in acute distress.    Appearance: She is well-developed.  HENT:     Head: Normocephalic.     Right Ear: External ear normal.     Left Ear: External ear normal.     Nose: Nose normal.  Eyes:     General:        Right eye: No discharge.        Left eye: No discharge.     Conjunctiva/sclera: Conjunctivae normal.      Pupils: Pupils are equal, round, and reactive to light.  Neck:     Thyroid: No thyromegaly.     Vascular: No JVD.     Trachea: No tracheal deviation.  Cardiovascular:     Rate and Rhythm: Normal rate and regular rhythm.     Heart sounds: Normal heart sounds.  Pulmonary:     Effort: No respiratory distress.     Breath sounds: No stridor. No wheezing.  Abdominal:     General: Bowel sounds are normal. There is no distension.     Palpations: Abdomen is soft. There is no mass.     Tenderness: There is no abdominal tenderness. There is no guarding or rebound.  Musculoskeletal:        General: No tenderness.     Cervical back: Normal range of motion and neck supple.  Lymphadenopathy:     Cervical: No cervical adenopathy.  Skin:    Findings: No erythema or rash.  Neurological:     Mental Status: She is oriented to person, place, and time.     Cranial Nerves: No cranial nerve deficit.     Motor: No abnormal muscle tone.     Coordination: Coordination normal.     Deep Tendon Reflexes: Reflexes normal.  Psychiatric:        Behavior: Behavior normal.        Thought Content: Thought content normal.        Judgment: Judgment normal.     Lab Results  Component Value Date   WBC 5.6 12/29/2019   HGB 12.3 12/29/2019   HCT 37.0 12/29/2019   PLT 268 12/29/2019   GLUCOSE 93 12/29/2019   CHOL 235 (H) 12/29/2019   TRIG 100 12/29/2019   HDL 69 12/29/2019   LDLDIRECT 188.2 01/10/2012   LDLCALC 145 (H) 12/29/2019   ALT 11 12/29/2019   AST 14 12/29/2019   NA 140 12/29/2019   K 3.9 12/29/2019   CL 104 12/29/2019   CREATININE 1.20 (H) 12/29/2019   BUN 19 12/29/2019   CO2 27 12/29/2019   TSH <0.01 (L) 12/29/2019   INR 1.0 08/01/2018    CT RENAL STONE STUDY  Result Date: 08/01/2018 CLINICAL DATA:  Hematuria, loose stools EXAM: CT ABDOMEN AND PELVIS WITHOUT CONTRAST TECHNIQUE: Multidetector CT imaging of the abdomen and pelvis was performed following the standard protocol without IV  contrast. COMPARISON:  12/24/2014 FINDINGS: Lower chest: No acute abnormality. Coronary artery calcifications. Hepatobiliary: No focal liver abnormality is seen. No gallstones, gallbladder wall thickening, or biliary dilatation. Pancreas: Unremarkable. No pancreatic ductal dilatation or surrounding inflammatory changes. Spleen: Normal in size without focal abnormality. Adrenals/Urinary Tract: Adrenal glands are unremarkable. Kidneys are  normal, without renal calculi, focal lesion, or hydronephrosis. Multiple small calculi in the dependent urinary bladder as seen on prior examination. Stomach/Bowel: Stomach is within normal limits. Appendix appears normal. No evidence of bowel wall thickening, distention, or inflammatory changes. Vascular/Lymphatic: Calcific atherosclerosis. No enlarged abdominal or pelvic lymph nodes. Reproductive: No mass or other abnormality. Status post hysterectomy. Other: No abdominal wall hernia or abnormality. No abdominopelvic ascites. Musculoskeletal: No acute or significant osseous findings. IMPRESSION: 1. No acute noncontrast CT findings of the abdomen or pelvis to explain hematuria or loose stools. 2. Multiple small calculi within the dependent urinary bladder, similar to prior study. Electronically Signed   By: Lauralyn Primes M.D.   On: 08/01/2018 15:42    Assessment & Plan:    Sonda Primes, MD

## 2020-03-24 ENCOUNTER — Ambulatory Visit (INDEPENDENT_AMBULATORY_CARE_PROVIDER_SITE_OTHER): Payer: Medicare Other | Admitting: Internal Medicine

## 2020-03-24 ENCOUNTER — Other Ambulatory Visit: Payer: Self-pay

## 2020-03-24 ENCOUNTER — Encounter: Payer: Self-pay | Admitting: Internal Medicine

## 2020-03-24 DIAGNOSIS — F419 Anxiety disorder, unspecified: Secondary | ICD-10-CM

## 2020-03-24 DIAGNOSIS — Z79890 Hormone replacement therapy (postmenopausal): Secondary | ICD-10-CM

## 2020-03-24 DIAGNOSIS — F329 Major depressive disorder, single episode, unspecified: Secondary | ICD-10-CM | POA: Diagnosis not present

## 2020-03-24 DIAGNOSIS — E034 Atrophy of thyroid (acquired): Secondary | ICD-10-CM

## 2020-03-24 DIAGNOSIS — R21 Rash and other nonspecific skin eruption: Secondary | ICD-10-CM

## 2020-03-24 DIAGNOSIS — I10 Essential (primary) hypertension: Secondary | ICD-10-CM

## 2020-03-24 LAB — BASIC METABOLIC PANEL
BUN: 11 mg/dL (ref 6–23)
CO2: 30 mEq/L (ref 19–32)
Calcium: 9.1 mg/dL (ref 8.4–10.5)
Chloride: 100 mEq/L (ref 96–112)
Creatinine, Ser: 1.23 mg/dL — ABNORMAL HIGH (ref 0.40–1.20)
GFR: 42.66 mL/min — ABNORMAL LOW (ref >60.00)
Glucose, Bld: 79 mg/dL (ref 70–99)
Potassium: 3.8 mEq/L (ref 3.5–5.1)
Sodium: 139 mEq/L (ref 135–145)

## 2020-03-24 LAB — T4, FREE: Free T4: 1.4 ng/dL (ref 0.60–1.60)

## 2020-03-24 MED ORDER — CLOBETASOL PROPIONATE 0.05 % EX OINT
1.0000 | TOPICAL_OINTMENT | Freq: Two times a day (BID) | CUTANEOUS | 0 refills | Status: DC
Start: 2020-03-24 — End: 2020-12-27

## 2020-03-24 NOTE — Assessment & Plan Note (Signed)
Start Losartan 50 mg/day if BPs are >140/90 Toprol, Nifedipine

## 2020-03-24 NOTE — Addendum Note (Signed)
Addended by: Merrilyn Puma on: 03/24/2020 03:54 PM   Modules accepted: Orders

## 2020-03-24 NOTE — Progress Notes (Signed)
Subjective:  Patient ID: [name], female    DOB: [birth year]  Age: 74 y.o. MRN: [id number]  CC: No chief complaint on file.   HPI [name] presents for HTN, CAD, anxiety, HRT f/u  Outpatient Medications Prior to Visit  Medication Sig Dispense Refill  . aspirin 81 MG EC tablet Take 81 mg by mouth daily.      . Cholecalciferol 1000 UNITS tablet Take 1,000 Units by mouth daily.      . clorazepate (TRANXENE) 15 MG tablet Take 1 tablet (15 mg total) by mouth 2 (two) times daily as needed for anxiety (do not take with Lorazepam). for anxiety 180 tablet 0  . doxepin (SINEQUAN) 50 MG capsule Take 3 capsules (150 mg total) by mouth at bedtime. 270 capsule 3  . estradiol (ESTRACE) 0.1 MG/GM vaginal cream Place 1 Applicatorful vaginally at bedtime as needed. 42.5 g 0  . estradiol cypionate (DEPO-ESTRADIOL) 5 MG/ML injection Use 0.3 ml every 18-25 days IM 5 mL 3  . furosemide (LASIX) 40 MG tablet Take 1 tablet (40 mg total) by mouth 2 (two) times daily. 180 tablet 3  . levothyroxine (SYNTHROID) 137 MCG tablet TAKE 1 TABLET BY MOUTH ONCE DAILY BEFORE BREAKFAST every other day and alternate with one half every other day 90 tablet 3  . LORazepam (ATIVAN) 0.5 MG tablet TAKE 1 TABLET BY MOUTH AT BEDTIME 90 tablet 1  . methylPREDNISolone (MEDROL DOSEPAK) 4 MG TBPK tablet As directed 21 tablet 0  . metoprolol tartrate (LOPRESSOR) 50 MG tablet Take 1 tablet (50 mg total) by mouth 3 (three) times daily. 270 tablet 3  . NIFEdipine (PROCARDIA-XL/NIFEDICAL-XL) 30 MG 24 hr tablet Take 1 tablet (30 mg total) by mouth daily. 90 tablet 3  . nitroGLYCERIN (NITROLINGUAL) 0.4 MG/SPRAY spray PLACE 1 SPRAY UNDER THE TONGUE EVERY 5 MINUTES AS NEEDED FOR CHEST PAIN. MAY REPEAT 3 TIMES 12 g 1  . potassium chloride SA (KLOR-CON M20) 20 MEQ tablet Take 1 tablet (20 mEq total) by mouth 2 (two) times daily. 180 tablet 3  . clobetasol ointment (TEMOVATE) 0.05 % Apply 1 application topically 2 (two) times daily. Apply as  directed twice daily for up to 2 weeks, then space out to 1-2 x a week. Not for daily long term use. 30 g 0   Facility-Administered Medications Prior to Visit  Medication Dose Route Frequency Provider Last Rate Last Admin  . estradiol cypionate (DEPO-ESTRADIOL) 5 MG/ML injection 1.5 mg  1.5 mg Intramuscular Q30 days Shayli Altemose, Georgina Quint, MD   1.5 mg at 02/25/20 1534    ROS: Review of Systems  Constitutional: Positive for diaphoresis. Negative for activity change, appetite change, chills, fatigue and unexpected weight change.  HENT: Negative for congestion, mouth sores and sinus pressure.   Eyes: Negative for visual disturbance.  Respiratory: Negative for cough and chest tightness.   Gastrointestinal: Negative for abdominal pain and nausea.  Genitourinary: Negative for difficulty urinating, frequency and vaginal pain.  Musculoskeletal: Positive for arthralgias. Negative for back pain and gait problem.  Skin: Negative for pallor and rash.  Neurological: Negative for dizziness, tremors, weakness, numbness and headaches.  Psychiatric/Behavioral: Negative for confusion, sleep disturbance and suicidal ideas. The patient is nervous/anxious.     Objective:  BP (!) 165/85   Pulse 69   Temp 97.7 F (36.5 C) (Oral)   Ht 5\' 8"  (1.727 m)   Wt 145 lb (65.8 kg)   SpO2 96%   BMI 22.05 kg/m   BP Readings  from Last 3 Encounters:  03/24/20 (!) 165/85  02/25/20 (!) 148/72  02/01/20 (!) 190/88    Wt Readings from Last 3 Encounters:  03/24/20 145 lb (65.8 kg)  02/25/20 143 lb (64.9 kg)  02/01/20 140 lb (63.5 kg)    Physical Exam Constitutional:      General: She is not in acute distress.    Appearance: She is well-developed.  HENT:     Head: Normocephalic.     Right Ear: External ear normal.     Left Ear: External ear normal.     Nose: Nose normal.  Eyes:     General:        Right eye: No discharge.        Left eye: No discharge.     Conjunctiva/sclera: Conjunctivae normal.      Pupils: Pupils are equal, round, and reactive to light.  Neck:     Thyroid: No thyromegaly.     Vascular: No JVD.     Trachea: No tracheal deviation.  Cardiovascular:     Rate and Rhythm: Normal rate and regular rhythm.     Heart sounds: Normal heart sounds.  Pulmonary:     Effort: No respiratory distress.     Breath sounds: No stridor. No wheezing.  Abdominal:     General: Bowel sounds are normal. There is no distension.     Palpations: Abdomen is soft. There is no mass.     Tenderness: There is no abdominal tenderness. There is no guarding or rebound.  Musculoskeletal:        General: No tenderness.     Cervical back: Normal range of motion and neck supple.  Lymphadenopathy:     Cervical: No cervical adenopathy.  Skin:    Findings: No erythema or rash.  Neurological:     Cranial Nerves: No cranial nerve deficit.     Motor: No abnormal muscle tone.     Coordination: Coordination normal.     Deep Tendon Reflexes: Reflexes normal.  Psychiatric:        Behavior: Behavior normal.        Thought Content: Thought content normal.        Judgment: Judgment normal.    Eczema on L hand   Lab Results  Component Value Date   WBC 5.6 12/29/2019   HGB 12.3 12/29/2019   HCT 37.0 12/29/2019   PLT 268 12/29/2019   GLUCOSE 93 12/29/2019   CHOL 235 (H) 12/29/2019   TRIG 100 12/29/2019   HDL 69 12/29/2019   LDLDIRECT 188.2 01/10/2012   LDLCALC 145 (H) 12/29/2019   ALT 11 12/29/2019   AST 14 12/29/2019   NA 140 12/29/2019   K 3.9 12/29/2019   CL 104 12/29/2019   CREATININE 1.20 (H) 12/29/2019   BUN 19 12/29/2019   CO2 27 12/29/2019   TSH <0.01 (L) 12/29/2019   INR 1.0 08/01/2018    CT RENAL STONE STUDY  Result Date: 08/01/2018 CLINICAL DATA:  Hematuria, loose stools EXAM: CT ABDOMEN AND PELVIS WITHOUT CONTRAST TECHNIQUE: Multidetector CT imaging of the abdomen and pelvis was performed following the standard protocol without IV contrast. COMPARISON:  12/24/2014 FINDINGS:  Lower chest: No acute abnormality. Coronary artery calcifications. Hepatobiliary: No focal liver abnormality is seen. No gallstones, gallbladder wall thickening, or biliary dilatation. Pancreas: Unremarkable. No pancreatic ductal dilatation or surrounding inflammatory changes. Spleen: Normal in size without focal abnormality. Adrenals/Urinary Tract: Adrenal glands are unremarkable. Kidneys are normal, without renal calculi, focal lesion, or hydronephrosis. Multiple  small calculi in the dependent urinary bladder as seen on prior examination. Stomach/Bowel: Stomach is within normal limits. Appendix appears normal. No evidence of bowel wall thickening, distention, or inflammatory changes. Vascular/Lymphatic: Calcific atherosclerosis. No enlarged abdominal or pelvic lymph nodes. Reproductive: No mass or other abnormality. Status post hysterectomy. Other: No abdominal wall hernia or abnormality. No abdominopelvic ascites. Musculoskeletal: No acute or significant osseous findings. IMPRESSION: 1. No acute noncontrast CT findings of the abdomen or pelvis to explain hematuria or loose stools. 2. Multiple small calculi within the dependent urinary bladder, similar to prior study. Electronically Signed   By: Lauralyn Primes M.D.   On: 08/01/2018 15:42    Assessment & Plan:    Sonda Primes, MD

## 2020-03-24 NOTE — Assessment & Plan Note (Signed)
L hand  Clobetasol oint Bx if not gone

## 2020-03-24 NOTE — Assessment & Plan Note (Signed)
Levothroid 

## 2020-03-24 NOTE — Assessment & Plan Note (Signed)
Cont w/chronic HRT estradiol injections for severe hot flashes Depo-Estradiol 0.3 ml  Potential benefits of a long term HRT  use as well as potential risks  and complications were explained to the patient and were aknowledged.

## 2020-03-24 NOTE — Assessment & Plan Note (Signed)
Doxepin at hs 

## 2020-03-24 NOTE — Addendum Note (Signed)
Addended by: Merrilyn Puma on: 03/24/2020 03:51 PM   Modules accepted: Orders

## 2020-03-24 NOTE — Assessment & Plan Note (Signed)
Lorazepam prn at hs only, Doxepine at hs Pt taking meds at night (not when driving). Tranzene - rare use  Potential benefits of a long term benzodiazepines  use as well as potential risks  and complications were explained to the patient and were aknowledged. 

## 2020-03-24 NOTE — Addendum Note (Signed)
Addended by: Kayde Atkerson N on: 03/24/2020 03:51 PM   Modules accepted: Orders  

## 2020-03-25 LAB — TSH: TSH: <0.01 u[IU]/mL — ABNORMAL LOW (ref 0.35–4.50)

## 2020-04-19 ENCOUNTER — Telehealth: Payer: Self-pay | Admitting: Cardiovascular Disease

## 2020-04-19 NOTE — Telephone Encounter (Signed)
    Pt would like to asked Dr. Excell Seltzer if its ok for her to wait until April to see her. She said she called around aug and was told it was too early to schedule an appt and now there's no available until April. She would like to ask if she can see Dr. Excell Seltzer this year. Offered APP pt declined.

## 2020-04-19 NOTE — Telephone Encounter (Signed)
Offered the patient an appointment with an APP next week as she says she has some slight swelling in her feet and she is due for her yearly visit. She also says her neck hurts. She declined APP visit. She has an appointment with her PCP this Thursday and will discuss symptoms at that time.  Informed the patient she will be placed on the waiting list and will be called if Dr. Excell Seltzer has cancellations.

## 2020-04-21 ENCOUNTER — Other Ambulatory Visit: Payer: Self-pay

## 2020-04-21 ENCOUNTER — Ambulatory Visit (INDEPENDENT_AMBULATORY_CARE_PROVIDER_SITE_OTHER): Payer: Medicare Other | Admitting: Internal Medicine

## 2020-04-21 ENCOUNTER — Encounter: Payer: Self-pay | Admitting: Internal Medicine

## 2020-04-21 VITALS — BP 184/82 | HR 76 | Temp 98.5°F | Wt 148.2 lb

## 2020-04-21 DIAGNOSIS — R609 Edema, unspecified: Secondary | ICD-10-CM | POA: Diagnosis not present

## 2020-04-21 DIAGNOSIS — I10 Essential (primary) hypertension: Secondary | ICD-10-CM | POA: Diagnosis not present

## 2020-04-21 DIAGNOSIS — F419 Anxiety disorder, unspecified: Secondary | ICD-10-CM

## 2020-04-21 DIAGNOSIS — Z79890 Hormone replacement therapy (postmenopausal): Secondary | ICD-10-CM | POA: Diagnosis not present

## 2020-04-21 MED ORDER — FUROSEMIDE 40 MG PO TABS
40.0000 mg | ORAL_TABLET | Freq: Every day | ORAL | 3 refills | Status: DC
Start: 2020-04-21 — End: 2020-08-16

## 2020-04-21 MED ORDER — CLORAZEPATE DIPOTASSIUM 15 MG PO TABS: 15.0000 mg | ORAL_TABLET | Freq: Three times a day (TID) | ORAL | 1 refills | Status: DC | PRN

## 2020-04-21 MED ORDER — ESTRADIOL CYPIONATE 5 MG/ML IM OIL
3.0000 mg | TOPICAL_OIL | Freq: Once | INTRAMUSCULAR | Status: AC
  Administered 2020-04-21: 3 mg via INTRAMUSCULAR

## 2020-04-21 NOTE — Progress Notes (Signed)
Subjective:  Patient ID: [name], female    DOB: [birth year]  Age: 74 y.o. MRN: [id number]  CC: Follow-up (4 week F/U)   HPI [name] presents for menopausal sx's, gout, hypothyroidism f/u The pt needs Tranxene #270 due to cost F/u HTN -  C/o leg swelling - worse  Outpatient Medications Prior to Visit  Medication Sig Dispense Refill  . aspirin 81 MG EC tablet Take 81 mg by mouth daily.      . Cholecalciferol 1000 UNITS tablet Take 1,000 Units by mouth daily.      . clobetasol ointment (TEMOVATE) 0.05 % Apply 1 application topically 2 (two) times daily. Apply as directed twice daily for up to 2 weeks, then space out to 1-2 x a week. Not for daily long term use. 30 g 0  . clorazepate (TRANXENE) 15 MG tablet Take 1 tablet (15 mg total) by mouth 2 (two) times daily as needed for anxiety (do not take with Lorazepam). for anxiety 180 tablet 0  . doxepin (SINEQUAN) 50 MG capsule Take 3 capsules (150 mg total) by mouth at bedtime. 270 capsule 3  . estradiol (ESTRACE) 0.1 MG/GM vaginal cream Place 1 Applicatorful vaginally at bedtime as needed. 42.5 g 0  . estradiol cypionate (DEPO-ESTRADIOL) 5 MG/ML injection Use 0.3 ml every 18-25 days IM 5 mL 3  . furosemide (LASIX) 40 MG tablet Take 1 tablet (40 mg total) by mouth 2 (two) times daily. 180 tablet 3  . levothyroxine (SYNTHROID) 137 MCG tablet TAKE 1 TABLET BY MOUTH ONCE DAILY BEFORE BREAKFAST every other day and alternate with one half every other day 90 tablet 3  . LORazepam (ATIVAN) 0.5 MG tablet TAKE 1 TABLET BY MOUTH AT BEDTIME 90 tablet 1  . metoprolol tartrate (LOPRESSOR) 50 MG tablet Take 1 tablet (50 mg total) by mouth 3 (three) times daily. 270 tablet 3  . NIFEdipine (PROCARDIA-XL/NIFEDICAL-XL) 30 MG 24 hr tablet Take 1 tablet (30 mg total) by mouth daily. 90 tablet 3  . nitroGLYCERIN (NITROLINGUAL) 0.4 MG/SPRAY spray PLACE 1 SPRAY UNDER THE TONGUE EVERY 5 MINUTES AS NEEDED FOR CHEST PAIN. MAY REPEAT 3 TIMES 12 g 1  . potassium  chloride SA (KLOR-CON M20) 20 MEQ tablet Take 1 tablet (20 mEq total) by mouth 2 (two) times daily. 180 tablet 3  . methylPREDNISolone (MEDROL DOSEPAK) 4 MG TBPK tablet As directed (Patient not taking: Reported on 04/21/2020) 21 tablet 0  . estradiol cypionate (DEPO-ESTRADIOL) 5 MG/ML injection 1.5 mg      No facility-administered medications prior to visit.    ROS: Review of Systems  Constitutional: Negative for activity change, appetite change, chills, fatigue and unexpected weight change.  HENT: Negative for congestion, mouth sores and sinus pressure.   Eyes: Negative for visual disturbance.  Respiratory: Negative for cough and chest tightness.   Gastrointestinal: Negative for abdominal pain and nausea.  Genitourinary: Negative for difficulty urinating, frequency and vaginal pain.  Musculoskeletal: Positive for arthralgias. Negative for back pain and gait problem.  Skin: Negative for pallor and rash.  Neurological: Negative for dizziness, tremors, weakness, numbness and headaches.  Psychiatric/Behavioral: Positive for sleep disturbance. Negative for confusion. The patient is nervous/anxious.     Objective:  BP (!) 184/82 (BP Location: Left Arm)   Pulse 76   Temp 98.5 F (36.9 C) (Oral)   Wt 148 lb 3.2 oz (67.2 kg)   SpO2 96%   BMI 22.53 kg/m   BP Readings from Last 3 Encounters:  04/21/20 Marland Kitchen)  184/82  03/24/20 (!) 165/85  02/25/20 (!) 148/72    Wt Readings from Last 3 Encounters:  04/21/20 148 lb 3.2 oz (67.2 kg)  03/24/20 145 lb (65.8 kg)  02/25/20 143 lb (64.9 kg)    Physical Exam Constitutional:      General: She is not in acute distress.    Appearance: She is well-developed.  HENT:     Head: Normocephalic.     Right Ear: External ear normal.     Left Ear: External ear normal.     Nose: Nose normal.  Eyes:     General:        Right eye: No discharge.        Left eye: No discharge.     Conjunctiva/sclera: Conjunctivae normal.     Pupils: Pupils are equal,  round, and reactive to light.  Neck:     Thyroid: No thyromegaly.     Vascular: No JVD.     Trachea: No tracheal deviation.  Cardiovascular:     Rate and Rhythm: Normal rate and regular rhythm.     Heart sounds: Normal heart sounds.  Pulmonary:     Effort: No respiratory distress.     Breath sounds: No stridor. No wheezing.  Abdominal:     General: Bowel sounds are normal. There is no distension.     Palpations: Abdomen is soft. There is no mass.     Tenderness: There is no abdominal tenderness. There is no guarding or rebound.  Musculoskeletal:        General: No tenderness.     Cervical back: Normal range of motion and neck supple.  Lymphadenopathy:     Cervical: No cervical adenopathy.  Skin:    Findings: No erythema or rash.  Neurological:     Cranial Nerves: No cranial nerve deficit.     Motor: No abnormal muscle tone.     Coordination: Coordination normal.     Deep Tendon Reflexes: Reflexes normal.  Psychiatric:        Behavior: Behavior normal.        Thought Content: Thought content normal.        Judgment: Judgment normal.     Lab Results  Component Value Date   WBC 5.6 12/29/2019   HGB 12.3 12/29/2019   HCT 37.0 12/29/2019   PLT 268 12/29/2019   GLUCOSE 79 03/24/2020   CHOL 235 (H) 12/29/2019   TRIG 100 12/29/2019   HDL 69 12/29/2019   LDLDIRECT 188.2 01/10/2012   LDLCALC 145 (H) 12/29/2019   ALT 11 12/29/2019   AST 14 12/29/2019   NA 139 03/24/2020   K 3.8 03/24/2020   CL 100 03/24/2020   CREATININE 1.23 (H) 03/24/2020   BUN 11 03/24/2020   CO2 30 03/24/2020   TSH <0.01 Repeated and verified X2. (L) 03/24/2020   INR 1.0 08/01/2018    CT RENAL STONE STUDY  Result Date: 08/01/2018 CLINICAL DATA:  Hematuria, loose stools EXAM: CT ABDOMEN AND PELVIS WITHOUT CONTRAST TECHNIQUE: Multidetector CT imaging of the abdomen and pelvis was performed following the standard protocol without IV contrast. COMPARISON:  12/24/2014 FINDINGS: Lower chest: No acute  abnormality. Coronary artery calcifications. Hepatobiliary: No focal liver abnormality is seen. No gallstones, gallbladder wall thickening, or biliary dilatation. Pancreas: Unremarkable. No pancreatic ductal dilatation or surrounding inflammatory changes. Spleen: Normal in size without focal abnormality. Adrenals/Urinary Tract: Adrenal glands are unremarkable. Kidneys are normal, without renal calculi, focal lesion, or hydronephrosis. Multiple small calculi in the dependent urinary  bladder as seen on prior examination. Stomach/Bowel: Stomach is within normal limits. Appendix appears normal. No evidence of bowel wall thickening, distention, or inflammatory changes. Vascular/Lymphatic: Calcific atherosclerosis. No enlarged abdominal or pelvic lymph nodes. Reproductive: No mass or other abnormality. Status post hysterectomy. Other: No abdominal wall hernia or abnormality. No abdominopelvic ascites. Musculoskeletal: No acute or significant osseous findings. IMPRESSION: 1. No acute noncontrast CT findings of the abdomen or pelvis to explain hematuria or loose stools. 2. Multiple small calculi within the dependent urinary bladder, similar to prior study. Electronically Signed   By: Lauralyn Primes M.D.   On: 08/01/2018 15:42    Assessment & Plan:    Sonda Primes, MD

## 2020-04-21 NOTE — Assessment & Plan Note (Signed)
Clonidine prn for SBP>190 Tranxene Rx changed

## 2020-04-21 NOTE — Patient Instructions (Signed)
Cut back on salt

## 2020-04-21 NOTE — Assessment & Plan Note (Addendum)
Toprol Nifedipine Clonidine prn for SBP>190

## 2020-04-21 NOTE — Assessment & Plan Note (Signed)
Worse Increased Furosemide to 80 mg q am

## 2020-04-22 NOTE — Telephone Encounter (Signed)
Called to offer the patient an earlier appointment 12/6 but she declined. She went and saw Dr. Posey Rea and he made several medication adjustments and is bringing her back in a couple weeks for a recheck. She will keep her appointment with Dr. Excell Seltzer as scheduled unless there is a cancellation after the new year and in the afternoon. She understands she will be called if that appointment becomes available. She was grateful for call and agrees with plan.

## 2020-05-05 DIAGNOSIS — H43393 Other vitreous opacities, bilateral: Secondary | ICD-10-CM | POA: Diagnosis not present

## 2020-05-16 ENCOUNTER — Other Ambulatory Visit (INDEPENDENT_AMBULATORY_CARE_PROVIDER_SITE_OTHER): Payer: Medicare Other

## 2020-05-16 DIAGNOSIS — R609 Edema, unspecified: Secondary | ICD-10-CM

## 2020-05-16 LAB — BASIC METABOLIC PANEL
BUN: 17 mg/dL (ref 6–23)
CO2: 30 mEq/L (ref 19–32)
Calcium: 9 mg/dL (ref 8.4–10.5)
Chloride: 102 mEq/L (ref 96–112)
Creatinine, Ser: 1.36 mg/dL — ABNORMAL HIGH (ref 0.40–1.20)
GFR: 37.78 mL/min — ABNORMAL LOW (ref >60.00)
Glucose, Bld: 96 mg/dL (ref 70–99)
Potassium: 3.3 mEq/L — ABNORMAL LOW (ref 3.5–5.1)
Sodium: 140 mEq/L (ref 135–145)

## 2020-05-16 LAB — T4, FREE: Free T4: 1.33 ng/dL (ref 0.60–1.60)

## 2020-05-16 LAB — TSH: TSH: <0.01 u[IU]/mL — ABNORMAL LOW (ref 0.35–4.50)

## 2020-05-18 ENCOUNTER — Ambulatory Visit (INDEPENDENT_AMBULATORY_CARE_PROVIDER_SITE_OTHER): Payer: Medicare Other | Admitting: Internal Medicine

## 2020-05-18 ENCOUNTER — Other Ambulatory Visit: Payer: Self-pay

## 2020-05-18 ENCOUNTER — Encounter: Payer: Self-pay | Admitting: Internal Medicine

## 2020-05-18 DIAGNOSIS — F5101 Primary insomnia: Secondary | ICD-10-CM | POA: Diagnosis not present

## 2020-05-18 DIAGNOSIS — N951 Menopausal and female climacteric states: Secondary | ICD-10-CM

## 2020-05-18 DIAGNOSIS — I10 Essential (primary) hypertension: Secondary | ICD-10-CM | POA: Diagnosis not present

## 2020-05-18 DIAGNOSIS — M10072 Idiopathic gout, left ankle and foot: Secondary | ICD-10-CM

## 2020-05-18 DIAGNOSIS — R609 Edema, unspecified: Secondary | ICD-10-CM

## 2020-05-18 DIAGNOSIS — M79671 Pain in right foot: Secondary | ICD-10-CM

## 2020-05-18 DIAGNOSIS — E876 Hypokalemia: Secondary | ICD-10-CM | POA: Insufficient documentation

## 2020-05-18 DIAGNOSIS — F419 Anxiety disorder, unspecified: Secondary | ICD-10-CM | POA: Diagnosis not present

## 2020-05-18 MED ORDER — ESTRADIOL CYPIONATE 5 MG/ML IM OIL
1.5000 mg | TOPICAL_OIL | Freq: Once | INTRAMUSCULAR | Status: AC
  Administered 2020-05-18: 1.5 mg via INTRAMUSCULAR

## 2020-05-18 NOTE — Assessment & Plan Note (Signed)
Compression sleeve for swelling

## 2020-05-18 NOTE — Assessment & Plan Note (Signed)
Lorazepam prn at hs only, Doxepine at hs Pt taking meds at night (not when driving). Tranzene - rare use

## 2020-05-18 NOTE — Assessment & Plan Note (Addendum)
due to [facility] Re-start Kcl

## 2020-05-18 NOTE — Assessment & Plan Note (Signed)
Better on Furosemide 80 mg q am

## 2020-05-18 NOTE — Progress Notes (Signed)
Subjective:  Patient ID: [name], female    DOB: 04-Jul-1945  Age: 74 y.o. MRN: [id number]  CC: No chief complaint on file.   HPI [name] presents for HTN - better on Lasix 2 tabs in am F/u menopausal sx's, gout, insomnia  Outpatient Medications Prior to Visit  Medication Sig Dispense Refill  . aspirin 81 MG EC tablet Take 81 mg by mouth daily.    . Cholecalciferol 1000 UNITS tablet Take 1,000 Units by mouth daily.    . clobetasol ointment (TEMOVATE) 0.05 % Apply 1 application topically 2 (two) times daily. Apply as directed twice daily for up to 2 weeks, then space out to 1-2 x a week. Not for daily long term use. 30 g 0  . clorazepate (TRANXENE) 15 MG tablet Take 1 tablet (15 mg total) by mouth 3 (three) times daily as needed for anxiety (do not take with Lorazepam). for anxiety 270 tablet 1  . doxepin (SINEQUAN) 50 MG capsule Take 3 capsules (150 mg total) by mouth at bedtime. 270 capsule 3  . estradiol (ESTRACE) 0.1 MG/GM vaginal cream Place 1 Applicatorful vaginally at bedtime as needed. 42.5 g 0  . estradiol cypionate (DEPO-ESTRADIOL) 5 MG/ML injection Use 0.3 ml every 18-25 days IM 5 mL 3  . furosemide (LASIX) 40 MG tablet Take 1-2 tablets (40-80 mg total) by mouth daily. 180 tablet 3  . levothyroxine (SYNTHROID) 137 MCG tablet TAKE 1 TABLET BY MOUTH ONCE DAILY BEFORE BREAKFAST every other day and alternate with one half every other day 90 tablet 3  . LORazepam (ATIVAN) 0.5 MG tablet TAKE 1 TABLET BY MOUTH AT BEDTIME 90 tablet 1  . metoprolol tartrate (LOPRESSOR) 50 MG tablet Take 1 tablet (50 mg total) by mouth 3 (three) times daily. 270 tablet 3  . NIFEdipine (PROCARDIA-XL/NIFEDICAL-XL) 30 MG 24 hr tablet Take 1 tablet (30 mg total) by mouth daily. 90 tablet 3  . nitroGLYCERIN (NITROLINGUAL) 0.4 MG/SPRAY spray PLACE 1 SPRAY UNDER THE TONGUE EVERY 5 MINUTES AS NEEDED FOR CHEST PAIN. MAY REPEAT 3 TIMES 12 g 1  . potassium chloride SA (KLOR-CON M20) 20 MEQ tablet Take 1  tablet (20 mEq total) by mouth 2 (two) times daily. 180 tablet 3   No facility-administered medications prior to visit.    ROS: Review of Systems  Constitutional: Negative for activity change, appetite change, chills, fatigue and unexpected weight change.  HENT: Negative for congestion, mouth sores and sinus pressure.   Eyes: Negative for visual disturbance.  Respiratory: Negative for cough and chest tightness.   Gastrointestinal: Negative for abdominal pain and nausea.  Genitourinary: Negative for difficulty urinating, frequency and vaginal pain.  Musculoskeletal: Negative for back pain and gait problem.  Skin: Negative for pallor and rash.  Neurological: Negative for dizziness, tremors, weakness, numbness and headaches.  Psychiatric/Behavioral: Negative for confusion and sleep disturbance. The patient is nervous/anxious.     Objective:  BP 118/70   Pulse 88   Temp 98.1 F (36.7 C) (Oral)   Ht 5\' 8"  (1.727 m)   Wt 147 lb (66.7 kg)   SpO2 97%   BMI 22.35 kg/m   BP Readings from Last 3 Encounters:  05/18/20 118/70  04/21/20 (!) 184/82  03/24/20 (!) 165/85    Wt Readings from Last 3 Encounters:  05/18/20 147 lb (66.7 kg)  04/21/20 148 lb 3.2 oz (67.2 kg)  03/24/20 145 lb (65.8 kg)    Physical Exam Constitutional:      General: She  is not in acute distress.    Appearance: She is well-developed.  HENT:     Head: Normocephalic.     Right Ear: External ear normal.     Left Ear: External ear normal.     Nose: Nose normal.     Mouth/Throat:     Mouth: Oropharynx is clear and moist.  Eyes:     General:        Right eye: No discharge.        Left eye: No discharge.     Conjunctiva/sclera: Conjunctivae normal.     Pupils: Pupils are equal, round, and reactive to light.  Neck:     Thyroid: No thyromegaly.     Vascular: No JVD.     Trachea: No tracheal deviation.  Cardiovascular:     Rate and Rhythm: Normal rate and regular rhythm.     Heart sounds: Normal heart  sounds.  Pulmonary:     Effort: No respiratory distress.     Breath sounds: No stridor. No wheezing.  Abdominal:     General: Bowel sounds are normal. There is no distension.     Palpations: Abdomen is soft. There is no mass.     Tenderness: There is no abdominal tenderness. There is no guarding or rebound.  Musculoskeletal:        General: No tenderness or edema.     Cervical back: Normal range of motion and neck supple.  Lymphadenopathy:     Cervical: No cervical adenopathy.  Skin:    Findings: No erythema or rash.  Neurological:     Mental Status: She is oriented to person, place, and time.     Cranial Nerves: No cranial nerve deficit.     Motor: No abnormal muscle tone.     Coordination: Coordination normal.     Deep Tendon Reflexes: Reflexes normal.  Psychiatric:        Mood and Affect: Mood and affect normal.        Behavior: Behavior normal.        Thought Content: Thought content normal.        Judgment: Judgment normal.   Compression sleeves on B feet   Lab Results  Component Value Date   WBC 5.6 12/29/2019   HGB 12.3 12/29/2019   HCT 37.0 12/29/2019   PLT 268 12/29/2019   GLUCOSE 96 05/16/2020   CHOL 235 (H) 12/29/2019   TRIG 100 12/29/2019   HDL 69 12/29/2019   LDLDIRECT 188.2 01/10/2012   LDLCALC 145 (H) 12/29/2019   ALT 11 12/29/2019   AST 14 12/29/2019   NA 140 05/16/2020   K 3.3 (L) 05/16/2020   CL 102 05/16/2020   CREATININE 1.36 (H) 05/16/2020   BUN 17 05/16/2020   CO2 30 05/16/2020   TSH <0.01 (L) 05/16/2020   INR 1.0 08/01/2018    CT RENAL STONE STUDY  Result Date: 08/01/2018 CLINICAL DATA:  Hematuria, loose stools EXAM: CT ABDOMEN AND PELVIS WITHOUT CONTRAST TECHNIQUE: Multidetector CT imaging of the abdomen and pelvis was performed following the standard protocol without IV contrast. COMPARISON:  12/24/2014 FINDINGS: Lower chest: No acute abnormality. Coronary artery calcifications. Hepatobiliary: No focal liver abnormality is seen. No  gallstones, gallbladder wall thickening, or biliary dilatation. Pancreas: Unremarkable. No pancreatic ductal dilatation or surrounding inflammatory changes. Spleen: Normal in size without focal abnormality. Adrenals/Urinary Tract: Adrenal glands are unremarkable. Kidneys are normal, without renal calculi, focal lesion, or hydronephrosis. Multiple small calculi in the dependent urinary bladder as seen on  prior examination. Stomach/Bowel: Stomach is within normal limits. Appendix appears normal. No evidence of bowel wall thickening, distention, or inflammatory changes. Vascular/Lymphatic: Calcific atherosclerosis. No enlarged abdominal or pelvic lymph nodes. Reproductive: No mass or other abnormality. Status post hysterectomy. Other: No abdominal wall hernia or abnormality. No abdominopelvic ascites. Musculoskeletal: No acute or significant osseous findings. IMPRESSION: 1. No acute noncontrast CT findings of the abdomen or pelvis to explain hematuria or loose stools. 2. Multiple small calculi within the dependent urinary bladder, similar to prior study. Electronically Signed   By: Lauralyn Primes M.D.   On: 08/01/2018 15:42    Assessment & Plan:    Sonda Primes, MD

## 2020-05-18 NOTE — Assessment & Plan Note (Signed)
No relapse 

## 2020-05-18 NOTE — Assessment & Plan Note (Signed)
Lorazepam at hs 

## 2020-05-18 NOTE — Assessment & Plan Note (Signed)
Compression sleeves for swelling

## 2020-05-18 NOTE — Assessment & Plan Note (Signed)
Depo-Estradiol 0.3 ml  Potential benefits of a long term HRT  use as well as potential risks  and complications were explained to the patient and were aknowledged. 2/20 Discussed cutting back on Rx or weaning off

## 2020-05-19 ENCOUNTER — Other Ambulatory Visit: Payer: Self-pay | Admitting: Internal Medicine

## 2020-05-19 DIAGNOSIS — Z1231 Encounter for screening mammogram for malignant neoplasm of breast: Secondary | ICD-10-CM | POA: Diagnosis not present

## 2020-05-19 DIAGNOSIS — Z803 Family history of malignant neoplasm of breast: Secondary | ICD-10-CM | POA: Diagnosis not present

## 2020-05-19 MED ORDER — POTASSIUM CHLORIDE CRYS ER 20 MEQ PO TBCR: 20.0000 meq | EXTENDED_RELEASE_TABLET | Freq: Every day | ORAL | 3 refills | Status: DC

## 2020-06-01 DIAGNOSIS — R928 Other abnormal and inconclusive findings on diagnostic imaging of breast: Secondary | ICD-10-CM | POA: Diagnosis not present

## 2020-06-01 DIAGNOSIS — R922 Inconclusive mammogram: Secondary | ICD-10-CM | POA: Diagnosis not present

## 2020-06-01 LAB — HM MAMMOGRAPHY: HM Mammogram: NORMAL (ref 0–4)

## 2020-06-03 ENCOUNTER — Encounter: Payer: Self-pay | Admitting: Internal Medicine

## 2020-06-03 ENCOUNTER — Other Ambulatory Visit: Payer: Self-pay

## 2020-06-03 ENCOUNTER — Ambulatory Visit (INDEPENDENT_AMBULATORY_CARE_PROVIDER_SITE_OTHER): Payer: Medicare Other | Admitting: Internal Medicine

## 2020-06-03 VITALS — BP 192/90 | HR 79 | Temp 98.0°F | Wt 147.8 lb

## 2020-06-03 DIAGNOSIS — M25461 Effusion, right knee: Secondary | ICD-10-CM | POA: Diagnosis not present

## 2020-06-03 DIAGNOSIS — M25562 Pain in left knee: Secondary | ICD-10-CM

## 2020-06-03 DIAGNOSIS — M25561 Pain in right knee: Secondary | ICD-10-CM | POA: Diagnosis not present

## 2020-06-03 MED ORDER — METHYLPREDNISOLONE ACETATE 40 MG/ML IJ SUSP
40.0000 mg | Freq: Once | INTRAMUSCULAR | Status: AC
  Administered 2020-06-03: 40 mg via INTRAMUSCULAR

## 2020-06-03 MED ORDER — LIDOCAINE-EPINEPHRINE 2 %-1:100000 IJ SOLN
1.0000 mL | Freq: Once | INTRAMUSCULAR | Status: AC
  Administered 2020-06-03: 1 mL

## 2020-06-03 MED ORDER — METHYLPREDNISOLONE 4 MG PO TBPK: ORAL_TABLET | ORAL | 0 refills | Status: DC

## 2020-06-03 NOTE — Patient Instructions (Signed)
Postprocedure instructions :    A Band-Aid should be left on for 12 hours. Injection therapy is not a cure itself. It is used in conjunction with other modalities. You can use nonsteroidal anti-inflammatories like ibuprofen , hot and cold compresses. Rest is recommended in the next 24 hours. You need to report immediately  if fever, chills or any signs of infection develop. 

## 2020-06-03 NOTE — Progress Notes (Signed)
Subjective:  Patient ID: [name], female    DOB: [birth year]  Age: 75 y.o. MRN: [id number]  CC: Joint Swelling (Pt states both of her knees are swollen)   HPI YASMEEN MANKA presents for being stressed about her mammogram at [facility] (it was ok - dense breasts).  C/o B knees pain after she swung her feet on the counter to paint her toenails 5 d ago  Outpatient Medications Prior to Visit  Medication Sig Dispense Refill  . aspirin 81 MG EC tablet Take 81 mg by mouth daily.    . Cholecalciferol 1000 UNITS tablet Take 1,000 Units by mouth daily.    . clobetasol ointment (TEMOVATE) 0.05 % Apply 1 application topically 2 (two) times daily. Apply as directed twice daily for up to 2 weeks, then space out to 1-2 x a week. Not for daily long term use. 30 g 0  . clorazepate (TRANXENE) 15 MG tablet Take 1 tablet (15 mg total) by mouth 3 (three) times daily as needed for anxiety (do not take with Lorazepam). for anxiety 270 tablet 1  . doxepin (SINEQUAN) 50 MG capsule Take 3 capsules (150 mg total) by mouth at bedtime. 270 capsule 3  . estradiol (ESTRACE) 0.1 MG/GM vaginal cream Place 1 Applicatorful vaginally at bedtime as needed. 42.5 g 0  . estradiol cypionate (DEPO-ESTRADIOL) 5 MG/ML injection Use 0.3 ml every 18-25 days IM 5 mL 3  . furosemide (LASIX) 40 MG tablet Take 1-2 tablets (40-80 mg total) by mouth daily. 180 tablet 3  . levothyroxine (SYNTHROID) 137 MCG tablet TAKE 1 TABLET BY MOUTH ONCE DAILY BEFORE BREAKFAST every other day and alternate with one half every other day 90 tablet 3  . LORazepam (ATIVAN) 0.5 MG tablet TAKE 1 TABLET BY MOUTH AT BEDTIME 90 tablet 1  . metoprolol tartrate (LOPRESSOR) 50 MG tablet Take 1 tablet (50 mg total) by mouth 3 (three) times daily. 270 tablet 3  . NIFEdipine (PROCARDIA-XL/NIFEDICAL-XL) 30 MG 24 hr tablet Take 1 tablet (30 mg total) by mouth daily. 90 tablet 3  . nitroGLYCERIN (NITROLINGUAL) 0.4 MG/SPRAY spray PLACE 1 SPRAY UNDER THE TONGUE EVERY 5  MINUTES AS NEEDED FOR CHEST PAIN. MAY REPEAT 3 TIMES 12 g 1  . potassium chloride SA (KLOR-CON M20) 20 MEQ tablet Take 1 tablet (20 mEq total) by mouth daily. 90 tablet 3   No facility-administered medications prior to visit.    ROS: Review of Systems  Constitutional: Negative for activity change, appetite change, chills, fatigue and unexpected weight change.  HENT: Negative for congestion, mouth sores and sinus pressure.   Eyes: Negative for visual disturbance.  Respiratory: Negative for cough and chest tightness.   Gastrointestinal: Negative for abdominal pain and nausea.  Genitourinary: Negative for difficulty urinating, frequency and vaginal pain.  Musculoskeletal: Positive for arthralgias. Negative for back pain and gait problem.  Skin: Negative for pallor and rash.  Neurological: Negative for dizziness, tremors, weakness, numbness and headaches.  Psychiatric/Behavioral: Negative for confusion, sleep disturbance and suicidal ideas. The patient is nervous/anxious.     Objective:  BP (!) 192/90 (BP Location: Left Arm)   Pulse 79   Temp 98 F (36.7 C) (Oral)   Wt 147 lb 12.8 oz (67 kg)   SpO2 97%   BMI 22.47 kg/m   BP Readings from Last 3 Encounters:  06/03/20 (!) 192/90  05/18/20 118/70  04/21/20 (!) 184/82    Wt Readings from Last 3 Encounters:  06/03/20 147 lb 12.8 oz (67  kg)  05/18/20 147 lb (66.7 kg)  04/21/20 148 lb 3.2 oz (67.2 kg)    Physical Exam Constitutional:      General: She is not in acute distress.    Appearance: She is well-developed.  HENT:     Head: Normocephalic.     Right Ear: External ear normal.     Left Ear: External ear normal.     Nose: Nose normal.     Mouth/Throat:     Mouth: Oropharynx is clear and moist.  Eyes:     General:        Right eye: No discharge.        Left eye: No discharge.     Conjunctiva/sclera: Conjunctivae normal.     Pupils: Pupils are equal, round, and reactive to light.  Neck:     Thyroid: No thyromegaly.      Vascular: No JVD.     Trachea: No tracheal deviation.  Cardiovascular:     Rate and Rhythm: Normal rate and regular rhythm.     Heart sounds: Normal heart sounds.  Pulmonary:     Effort: No respiratory distress.     Breath sounds: No stridor. No wheezing.  Abdominal:     General: Bowel sounds are normal. There is no distension.     Palpations: Abdomen is soft. There is no mass.     Tenderness: There is no abdominal tenderness. There is no guarding or rebound.  Musculoskeletal:        General: Swelling and tenderness present. No edema.     Cervical back: Normal range of motion and neck supple.  Lymphadenopathy:     Cervical: No cervical adenopathy.  Skin:    Findings: No erythema or rash.  Neurological:     Cranial Nerves: No cranial nerve deficit.     Motor: No abnormal muscle tone.     Coordination: Coordination normal.     Deep Tendon Reflexes: Reflexes normal.  Psychiatric:        Mood and Affect: Mood and affect normal.        Behavior: Behavior normal.        Thought Content: Thought content normal.        Judgment: Judgment normal.   R>L knee effusion, pain w/ROM   Procedure Note :     Procedure : Joint fluid aspiration and steroid injection, right knee   Indication:  Joint effusion with  pain.   Risks including unsuccessful procedure , bleeding, infection, bruising, skin atrophy, "steroid flare-up" and others were explained to the patient in detail as well as the benefits. Informed consent was obtained verbally.  Tthe patient was placed in a comfortable position. Lateral suprapatellar approach assisted with POC ultrasound was used. Skin was prepped with Betadine and alcohol  and anesthetized a cooling spray. Then, a 10 cc syringe with a 1.5 inch long 22-gauge needle was used for a joint fluid aspiration-30 cc of straw-colored fluid was aspirated the knee joint cavity.  Then I injected the joint with 1 mL of 2% lidocaine and 40 mg of Depo-Medrol .  Band-Aid was  applied.  Ace wrap   Tolerated well. Complications: None. Good pain relief following the procedure.   Postprocedure instructions :    A Band-Aid should be left on for 12 hours. Injection therapy is not a cure itself. It is used in conjunction with other modalities. You can use nonsteroidal anti-inflammatories like ibuprofen , hot and cold compresses. Rest is recommended in the next 24 hours.  You need to report immediately  if fever, chills or any signs of infection develop.   Lab Results  Component Value Date   WBC 5.6 12/29/2019   HGB 12.3 12/29/2019   HCT 37.0 12/29/2019   PLT 268 12/29/2019   GLUCOSE 96 05/16/2020   CHOL 235 (H) 12/29/2019   TRIG 100 12/29/2019   HDL 69 12/29/2019   LDLDIRECT 188.2 01/10/2012   LDLCALC 145 (H) 12/29/2019   ALT 11 12/29/2019   AST 14 12/29/2019   NA 140 05/16/2020   K 3.3 (L) 05/16/2020   CL 102 05/16/2020   CREATININE 1.36 (H) 05/16/2020   BUN 17 05/16/2020   CO2 30 05/16/2020   TSH <0.01 (L) 05/16/2020   INR 1.0 08/01/2018    CT RENAL STONE STUDY  Result Date: 08/01/2018 CLINICAL DATA:  Hematuria, loose stools EXAM: CT ABDOMEN AND PELVIS WITHOUT CONTRAST TECHNIQUE: Multidetector CT imaging of the abdomen and pelvis was performed following the standard protocol without IV contrast. COMPARISON:  12/24/2014 FINDINGS: Lower chest: No acute abnormality. Coronary artery calcifications. Hepatobiliary: No focal liver abnormality is seen. No gallstones, gallbladder wall thickening, or biliary dilatation. Pancreas: Unremarkable. No pancreatic ductal dilatation or surrounding inflammatory changes. Spleen: Normal in size without focal abnormality. Adrenals/Urinary Tract: Adrenal glands are unremarkable. Kidneys are normal, without renal calculi, focal lesion, or hydronephrosis. Multiple small calculi in the dependent urinary bladder as seen on prior examination. Stomach/Bowel: Stomach is within normal limits. Appendix appears normal. No evidence of bowel  wall thickening, distention, or inflammatory changes. Vascular/Lymphatic: Calcific atherosclerosis. No enlarged abdominal or pelvic lymph nodes. Reproductive: No mass or other abnormality. Status post hysterectomy. Other: No abdominal wall hernia or abnormality. No abdominopelvic ascites. Musculoskeletal: No acute or significant osseous findings. IMPRESSION: 1. No acute noncontrast CT findings of the abdomen or pelvis to explain hematuria or loose stools. 2. Multiple small calculi within the dependent urinary bladder, similar to prior study. Electronically Signed   By: Lauralyn Primes M.D.   On: 08/01/2018 15:42    Assessment & Plan:    Sonda Primes, MD

## 2020-06-05 ENCOUNTER — Encounter: Payer: Self-pay | Admitting: Internal Medicine

## 2020-06-05 DIAGNOSIS — M25562 Pain in left knee: Secondary | ICD-10-CM | POA: Insufficient documentation

## 2020-06-05 DIAGNOSIS — M25461 Effusion, right knee: Secondary | ICD-10-CM | POA: Insufficient documentation

## 2020-06-05 DIAGNOSIS — M25561 Pain in right knee: Secondary | ICD-10-CM | POA: Insufficient documentation

## 2020-06-05 NOTE — Assessment & Plan Note (Signed)
The effusion was minimal.  We did not aspirate.  Ace wrap.  Medrol Dosepak

## 2020-06-05 NOTE — Assessment & Plan Note (Signed)
See procedure-knee fluid aspiration and injection. Ace wrap. Medrol Dosepak prescribed

## 2020-06-15 ENCOUNTER — Encounter: Payer: Self-pay | Admitting: Internal Medicine

## 2020-06-15 ENCOUNTER — Ambulatory Visit (INDEPENDENT_AMBULATORY_CARE_PROVIDER_SITE_OTHER): Payer: Medicare Other | Admitting: Internal Medicine

## 2020-06-15 ENCOUNTER — Other Ambulatory Visit: Payer: Self-pay

## 2020-06-15 VITALS — BP 172/80 | HR 78 | Temp 97.8°F | Wt 145.8 lb

## 2020-06-15 DIAGNOSIS — N951 Menopausal and female climacteric states: Secondary | ICD-10-CM

## 2020-06-15 DIAGNOSIS — M10072 Idiopathic gout, left ankle and foot: Secondary | ICD-10-CM | POA: Diagnosis not present

## 2020-06-15 DIAGNOSIS — M79671 Pain in right foot: Secondary | ICD-10-CM | POA: Diagnosis not present

## 2020-06-15 DIAGNOSIS — M25562 Pain in left knee: Secondary | ICD-10-CM | POA: Diagnosis not present

## 2020-06-15 DIAGNOSIS — M25461 Effusion, right knee: Secondary | ICD-10-CM | POA: Diagnosis not present

## 2020-06-15 DIAGNOSIS — M25561 Pain in right knee: Secondary | ICD-10-CM | POA: Diagnosis not present

## 2020-06-15 MED ORDER — METHYLPREDNISOLONE 4 MG PO TBPK: ORAL_TABLET | ORAL | 2 refills | Status: DC

## 2020-06-15 MED ORDER — ESTRADIOL CYPIONATE 5 MG/ML IM OIL
1.5000 mg | TOPICAL_OIL | Freq: Once | INTRAMUSCULAR | Status: AC
  Administered 2020-06-15: 1.5 mg via INTRAMUSCULAR

## 2020-06-15 NOTE — Assessment & Plan Note (Signed)
Use Voltaren gel for gout, arthritis Worse - Medrol Rx

## 2020-06-15 NOTE — Assessment & Plan Note (Signed)
Use Voltaren gel for gout, arthritis 

## 2020-06-15 NOTE — Assessment & Plan Note (Signed)
Use Voltaren gel for gout, arthritis

## 2020-06-15 NOTE — Progress Notes (Signed)
Subjective:  Patient ID: BRYAN OMURA, female    DOB: 01/04/1946  Age: 75 y.o. MRN: [id number]  CC: Follow-up (2 WEEK F/U- C/o of (R) foot swollen and painful)   HPI Vernessa C Truss presents for gout, OA, sweats  Outpatient Medications Prior to Visit  Medication Sig Dispense Refill   aspirin 81 MG EC tablet Take 81 mg by mouth daily.     Cholecalciferol 1000 UNITS tablet Take 1,000 Units by mouth daily.     clobetasol ointment (TEMOVATE) 0.05 % Apply 1 application topically 2 (two) times daily. Apply as directed twice daily for up to 2 weeks, then space out to 1-2 x a week. Not for daily long term use. 30 g 0   clorazepate (TRANXENE) 15 MG tablet Take 1 tablet (15 mg total) by mouth 3 (three) times daily as needed for anxiety (do not take with Lorazepam). for anxiety 270 tablet 1   doxepin (SINEQUAN) 50 MG capsule Take 3 capsules (150 mg total) by mouth at bedtime. 270 capsule 3   estradiol (ESTRACE) 0.1 MG/GM vaginal cream Place 1 Applicatorful vaginally at bedtime as needed. 42.5 g 0   estradiol cypionate (DEPO-ESTRADIOL) 5 MG/ML injection Use 0.3 ml every 18-25 days IM 5 mL 3   furosemide (LASIX) 40 MG tablet Take 1-2 tablets (40-80 mg total) by mouth daily. 180 tablet 3   levothyroxine (SYNTHROID) 137 MCG tablet TAKE 1 TABLET BY MOUTH ONCE DAILY BEFORE BREAKFAST every other day and alternate with one half every other day 90 tablet 3   LORazepam (ATIVAN) 0.5 MG tablet TAKE 1 TABLET BY MOUTH AT BEDTIME 90 tablet 1   metoprolol tartrate (LOPRESSOR) 50 MG tablet Take 1 tablet (50 mg total) by mouth 3 (three) times daily. 270 tablet 3   NIFEdipine (PROCARDIA-XL/NIFEDICAL-XL) 30 MG 24 hr tablet Take 1 tablet (30 mg total) by mouth daily. 90 tablet 3   nitroGLYCERIN (NITROLINGUAL) 0.4 MG/SPRAY spray PLACE 1 SPRAY UNDER THE TONGUE EVERY 5 MINUTES AS NEEDED FOR CHEST PAIN. MAY REPEAT 3 TIMES 12 g 1   potassium chloride SA (KLOR-CON M20) 20 MEQ tablet Take 1 tablet (20 mEq total) by mouth daily. 90  tablet 3   methylPREDNISolone (MEDROL DOSEPAK) 4 MG TBPK tablet As directed (Patient not taking: Reported on 06/15/2020) 21 tablet 0   No facility-administered medications prior to visit.    ROS: Review of Systems  Constitutional: Positive for diaphoresis. Negative for activity change, appetite change, chills, fatigue and unexpected weight change.  HENT: Negative for congestion, mouth sores and sinus pressure.   Eyes: Negative for visual disturbance.  Respiratory: Negative for cough and chest tightness.   Cardiovascular: Positive for leg swelling.  Gastrointestinal: Negative for abdominal pain and nausea.  Genitourinary: Negative for difficulty urinating, frequency and vaginal pain.  Musculoskeletal: Positive for arthralgias. Negative for back pain and gait problem.  Skin: Negative for pallor and rash.  Neurological: Negative for dizziness, tremors, weakness, numbness and headaches.  Psychiatric/Behavioral: Negative for confusion and sleep disturbance.    Objective:  BP (!) 172/80 (BP Location: Left Arm)    Pulse 78    Temp 97.8 F (36.6 C) (Oral)    Wt 145 lb 12.8 oz (66.1 kg)    SpO2 97%    BMI 22.17 kg/m   BP Readings from Last 3 Encounters:  06/15/20 (!) 172/80  06/03/20 (!) 192/90  05/18/20 118/70    Wt Readings from Last 3 Encounters:  06/15/20 145 lb 12.8 oz (66.1 kg)  06/03/20 147 lb 12.8 oz (67 kg)  05/18/20 147 lb (66.7 kg)    Physical Exam Constitutional:      General: She is not in acute distress.    Appearance: She is well-developed.  HENT:     Head: Normocephalic.     Right Ear: External ear normal.     Left Ear: External ear normal.     Nose: Nose normal.  Eyes:     General:        Right eye: No discharge.        Left eye: No discharge.     Conjunctiva/sclera: Conjunctivae normal.     Pupils: Pupils are equal, round, and reactive to light.  Neck:     Thyroid: No thyromegaly.     Vascular: No JVD.     Trachea: No tracheal deviation.   Cardiovascular:     Rate and Rhythm: Normal rate and regular rhythm.     Heart sounds: Normal heart sounds.  Pulmonary:     Effort: No respiratory distress.     Breath sounds: No stridor. No wheezing.  Abdominal:     General: Bowel sounds are normal. There is no distension.     Palpations: Abdomen is soft. There is no mass.     Tenderness: There is no abdominal tenderness. There is no guarding or rebound.  Musculoskeletal:        General: Tenderness present.     Cervical back: Normal range of motion and neck supple. No rigidity.  Lymphadenopathy:     Cervical: No cervical adenopathy.  Skin:    Findings: No erythema or rash.  Neurological:     Mental Status: She is oriented to person, place, and time.     Cranial Nerves: No cranial nerve deficit.     Motor: No abnormal muscle tone.     Coordination: Coordination normal.     Deep Tendon Reflexes: Reflexes normal.  Psychiatric:        Behavior: Behavior normal.        Thought Content: Thought content normal.        Judgment: Judgment normal.  R foot w/swelling  Lab Results  Component Value Date   WBC 5.6 12/29/2019   HGB 12.3 12/29/2019   HCT 37.0 12/29/2019   PLT 268 12/29/2019   GLUCOSE 96 05/16/2020   CHOL 235 (H) 12/29/2019   TRIG 100 12/29/2019   HDL 69 12/29/2019   LDLDIRECT 188.2 01/10/2012   LDLCALC 145 (H) 12/29/2019   ALT 11 12/29/2019   AST 14 12/29/2019   NA 140 05/16/2020   K 3.3 (L) 05/16/2020   CL 102 05/16/2020   CREATININE 1.36 (H) 05/16/2020   BUN 17 05/16/2020   CO2 30 05/16/2020   TSH <0.01 (L) 05/16/2020   INR 1.0 08/01/2018    CT RENAL STONE STUDY  Result Date: 08/01/2018 CLINICAL DATA:  Hematuria, loose stools EXAM: CT ABDOMEN AND PELVIS WITHOUT CONTRAST TECHNIQUE: Multidetector CT imaging of the abdomen and pelvis was performed following the standard protocol without IV contrast. COMPARISON:  12/24/2014 FINDINGS: Lower chest: No acute abnormality. Coronary artery calcifications.  Hepatobiliary: No focal liver abnormality is seen. No gallstones, gallbladder wall thickening, or biliary dilatation. Pancreas: Unremarkable. No pancreatic ductal dilatation or surrounding inflammatory changes. Spleen: Normal in size without focal abnormality. Adrenals/Urinary Tract: Adrenal glands are unremarkable. Kidneys are normal, without renal calculi, focal lesion, or hydronephrosis. Multiple small calculi in the dependent urinary bladder as seen on prior examination. Stomach/Bowel: Stomach is within normal  limits. Appendix appears normal. No evidence of bowel wall thickening, distention, or inflammatory changes. Vascular/Lymphatic: Calcific atherosclerosis. No enlarged abdominal or pelvic lymph nodes. Reproductive: No mass or other abnormality. Status post hysterectomy. Other: No abdominal wall hernia or abnormality. No abdominopelvic ascites. Musculoskeletal: No acute or significant osseous findings. IMPRESSION: 1. No acute noncontrast CT findings of the abdomen or pelvis to explain hematuria or loose stools. 2. Multiple small calculi within the dependent urinary bladder, similar to prior study. Electronically Signed   By: Lauralyn Primes M.D.   On: 08/01/2018 15:42    Assessment & Plan:   Skylene was seen today for follow-up.  Diagnoses and all orders for this visit:  Hot flash, menopausal -     estradiol cypionate (DEPO-ESTRADIOL) 5 MG/ML injection 1.5 mg     Meds ordered this encounter  Medications   estradiol cypionate (DEPO-ESTRADIOL) 5 MG/ML injection 1.5 mg     Follow-up: No follow-ups on file.  Sonda Primes, MD

## 2020-06-15 NOTE — Patient Instructions (Signed)
Use Voltaren gel for gout, arthritis 

## 2020-06-16 ENCOUNTER — Ambulatory Visit: Payer: Medicare Other | Admitting: Internal Medicine

## 2020-06-16 ENCOUNTER — Other Ambulatory Visit: Payer: Self-pay

## 2020-06-16 ENCOUNTER — Ambulatory Visit (INDEPENDENT_AMBULATORY_CARE_PROVIDER_SITE_OTHER): Payer: Medicare Other | Admitting: Internal Medicine

## 2020-06-16 ENCOUNTER — Encounter: Payer: Self-pay | Admitting: Internal Medicine

## 2020-06-16 DIAGNOSIS — M10072 Idiopathic gout, left ankle and foot: Secondary | ICD-10-CM

## 2020-06-16 MED ORDER — COLCHICINE 0.6 MG PO TABS: ORAL_TABLET | ORAL | 1 refills | Status: DC

## 2020-06-16 MED ORDER — METHYLPREDNISOLONE ACETATE 80 MG/ML IJ SUSP
80.0000 mg | Freq: Once | INTRAMUSCULAR | Status: AC
Start: 2020-06-16 — End: 2020-06-16
  Administered 2020-06-16: 80 mg via INTRAMUSCULAR

## 2020-06-16 NOTE — Progress Notes (Signed)
Subjective:  Patient ID: [name], female    DOB: 1945/07/17  Age: 75 y.o. MRN: [id number]  CC: Foot Swelling (Pt states this am her (R) foot was red and hot to touch. Was not able to walk, but after starting dose pack she can put some weight on it)   HPI [name] presents for right foot pain and swelling, very severe.  It is better since she started to take Medrol Dosepak  Outpatient Medications Prior to Visit  Medication Sig Dispense Refill  . aspirin 81 MG EC tablet Take 81 mg by mouth daily.    . Cholecalciferol 1000 UNITS tablet Take 1,000 Units by mouth daily.    . clobetasol ointment (TEMOVATE) 0.05 % Apply 1 application topically 2 (two) times daily. Apply as directed twice daily for up to 2 weeks, then space out to 1-2 x a week. Not for daily long term use. 30 g 0  . clorazepate (TRANXENE) 15 MG tablet Take 1 tablet (15 mg total) by mouth 3 (three) times daily as needed for anxiety (do not take with Lorazepam). for anxiety 270 tablet 1  . doxepin (SINEQUAN) 50 MG capsule Take 3 capsules (150 mg total) by mouth at bedtime. 270 capsule 3  . estradiol (ESTRACE) 0.1 MG/GM vaginal cream Place 1 Applicatorful vaginally at bedtime as needed. 42.5 g 0  . estradiol cypionate (DEPO-ESTRADIOL) 5 MG/ML injection Use 0.3 ml every 18-25 days IM 5 mL 3  . furosemide (LASIX) 40 MG tablet Take 1-2 tablets (40-80 mg total) by mouth daily. 180 tablet 3  . levothyroxine (SYNTHROID) 137 MCG tablet TAKE 1 TABLET BY MOUTH ONCE DAILY BEFORE BREAKFAST every other day and alternate with one half every other day 90 tablet 3  . LORazepam (ATIVAN) 0.5 MG tablet TAKE 1 TABLET BY MOUTH AT BEDTIME 90 tablet 1  . methylPREDNISolone (MEDROL DOSEPAK) 4 MG TBPK tablet As directed for a gout attack 21 tablet 2  . metoprolol tartrate (LOPRESSOR) 50 MG tablet Take 1 tablet (50 mg total) by mouth 3 (three) times daily. 270 tablet 3  . NIFEdipine (PROCARDIA-XL/NIFEDICAL-XL) 30 MG 24 hr tablet Take 1 tablet (30 mg  total) by mouth daily. 90 tablet 3  . nitroGLYCERIN (NITROLINGUAL) 0.4 MG/SPRAY spray PLACE 1 SPRAY UNDER THE TONGUE EVERY 5 MINUTES AS NEEDED FOR CHEST PAIN. MAY REPEAT 3 TIMES 12 g 1  . potassium chloride SA (KLOR-CON M20) 20 MEQ tablet Take 1 tablet (20 mEq total) by mouth daily. 90 tablet 3   No facility-administered medications prior to visit.    ROS: Review of Systems  Constitutional: Negative for activity change, appetite change, chills, fatigue and unexpected weight change.  HENT: Negative for congestion, mouth sores and sinus pressure.   Eyes: Negative for visual disturbance.  Respiratory: Negative for cough and chest tightness.   Gastrointestinal: Negative for abdominal pain and nausea.  Genitourinary: Negative for difficulty urinating, frequency and vaginal pain.  Musculoskeletal: Positive for arthralgias. Negative for back pain and gait problem.  Skin: Negative for pallor and rash.  Neurological: Negative for dizziness, tremors, weakness, numbness and headaches.  Psychiatric/Behavioral: Negative for confusion and sleep disturbance.    Objective:  BP (!) 170/82 (BP Location: Left Arm)   Pulse 77   Temp 98.5 F (36.9 C) (Oral)   SpO2 95%   BP Readings from Last 3 Encounters:  06/16/20 (!) 170/82  06/15/20 (!) 172/80  06/03/20 (!) 192/90    Wt Readings from Last 3 Encounters:  06/15/20  145 lb 12.8 oz (66.1 kg)  06/03/20 147 lb 12.8 oz (67 kg)  05/18/20 147 lb (66.7 kg)    Physical Exam Constitutional:      General: She is not in acute distress.    Appearance: She is well-developed.  HENT:     Head: Normocephalic.     Right Ear: External ear normal.     Left Ear: External ear normal.     Nose: Nose normal.     Mouth/Throat:     Mouth: Oropharynx is clear and moist.  Eyes:     General:        Right eye: No discharge.        Left eye: No discharge.     Conjunctiva/sclera: Conjunctivae normal.     Pupils: Pupils are equal, round, and reactive to light.   Neck:     Thyroid: No thyromegaly.     Vascular: No JVD.     Trachea: No tracheal deviation.  Cardiovascular:     Rate and Rhythm: Normal rate and regular rhythm.     Heart sounds: Normal heart sounds.  Pulmonary:     Effort: No respiratory distress.     Breath sounds: No stridor. No wheezing.  Abdominal:     General: Bowel sounds are normal. There is no distension.     Palpations: Abdomen is soft. There is no mass.     Tenderness: There is no abdominal tenderness. There is no guarding or rebound.  Musculoskeletal:        General: Tenderness present.     Cervical back: Normal range of motion and neck supple.     Right lower leg: Edema present.  Lymphadenopathy:     Cervical: No cervical adenopathy.  Skin:    Findings: No erythema or rash.  Neurological:     Mental Status: She is oriented to person, place, and time.     Cranial Nerves: No cranial nerve deficit.     Motor: No abnormal muscle tone.     Coordination: Coordination normal.     Deep Tendon Reflexes: Reflexes normal.  Psychiatric:        Mood and Affect: Mood and affect normal.        Behavior: Behavior normal.        Thought Content: Thought content normal.        Judgment: Judgment normal.    Right lateral ankle with painful swelling.  No redness or heat at present Lab Results  Component Value Date   WBC 5.6 12/29/2019   HGB 12.3 12/29/2019   HCT 37.0 12/29/2019   PLT 268 12/29/2019   GLUCOSE 96 05/16/2020   CHOL 235 (H) 12/29/2019   TRIG 100 12/29/2019   HDL 69 12/29/2019   LDLDIRECT 188.2 01/10/2012   LDLCALC 145 (H) 12/29/2019   ALT 11 12/29/2019   AST 14 12/29/2019   NA 140 05/16/2020   K 3.3 (L) 05/16/2020   CL 102 05/16/2020   CREATININE 1.36 (H) 05/16/2020   BUN 17 05/16/2020   CO2 30 05/16/2020   TSH <0.01 (L) 05/16/2020   INR 1.0 08/01/2018    CT RENAL STONE STUDY  Result Date: 08/01/2018 CLINICAL DATA:  Hematuria, loose stools EXAM: CT ABDOMEN AND PELVIS WITHOUT CONTRAST TECHNIQUE:  Multidetector CT imaging of the abdomen and pelvis was performed following the standard protocol without IV contrast. COMPARISON:  12/24/2014 FINDINGS: Lower chest: No acute abnormality. Coronary artery calcifications. Hepatobiliary: No focal liver abnormality is seen. No gallstones, gallbladder wall thickening,  or biliary dilatation. Pancreas: Unremarkable. No pancreatic ductal dilatation or surrounding inflammatory changes. Spleen: Normal in size without focal abnormality. Adrenals/Urinary Tract: Adrenal glands are unremarkable. Kidneys are normal, without renal calculi, focal lesion, or hydronephrosis. Multiple small calculi in the dependent urinary bladder as seen on prior examination. Stomach/Bowel: Stomach is within normal limits. Appendix appears normal. No evidence of bowel wall thickening, distention, or inflammatory changes. Vascular/Lymphatic: Calcific atherosclerosis. No enlarged abdominal or pelvic lymph nodes. Reproductive: No mass or other abnormality. Status post hysterectomy. Other: No abdominal wall hernia or abnormality. No abdominopelvic ascites. Musculoskeletal: No acute or significant osseous findings. IMPRESSION: 1. No acute noncontrast CT findings of the abdomen or pelvis to explain hematuria or loose stools. 2. Multiple small calculi within the dependent urinary bladder, similar to prior study. Electronically Signed   By: Lauralyn Primes M.D.   On: 08/01/2018 15:42    Assessment & Plan:   There are no diagnoses linked to this encounter.   No orders of the defined types were placed in this encounter.    Follow-up: No follow-ups on file.  Sonda Primes, MD

## 2020-06-16 NOTE — Assessment & Plan Note (Signed)
Worse Start Medrol pack Depo-medrol 80 mg IM inj Try Colchicine

## 2020-06-20 ENCOUNTER — Encounter: Payer: Self-pay | Admitting: Internal Medicine

## 2020-07-13 ENCOUNTER — Encounter: Payer: Self-pay | Admitting: Internal Medicine

## 2020-07-13 ENCOUNTER — Ambulatory Visit (INDEPENDENT_AMBULATORY_CARE_PROVIDER_SITE_OTHER): Payer: Medicare Other | Admitting: Internal Medicine

## 2020-07-13 ENCOUNTER — Other Ambulatory Visit: Payer: Self-pay

## 2020-07-13 DIAGNOSIS — M1712 Unilateral primary osteoarthritis, left knee: Secondary | ICD-10-CM

## 2020-07-13 DIAGNOSIS — N951 Menopausal and female climacteric states: Secondary | ICD-10-CM

## 2020-07-13 DIAGNOSIS — G8929 Other chronic pain: Secondary | ICD-10-CM | POA: Diagnosis not present

## 2020-07-13 DIAGNOSIS — I10 Essential (primary) hypertension: Secondary | ICD-10-CM

## 2020-07-13 DIAGNOSIS — M25562 Pain in left knee: Secondary | ICD-10-CM | POA: Diagnosis not present

## 2020-07-13 MED ORDER — METHYLPREDNISOLONE ACETATE 40 MG/ML IJ SUSP
40.0000 mg | Freq: Once | INTRAMUSCULAR | Status: AC
  Administered 2020-07-13: 40 mg via INTRAMUSCULAR

## 2020-07-13 MED ORDER — ESTRADIOL CYPIONATE 5 MG/ML IM OIL
0.5000 mg | TOPICAL_OIL | Freq: Once | INTRAMUSCULAR | Status: AC
  Administered 2020-07-13: 0.5 mg via INTRAMUSCULAR

## 2020-07-13 MED ORDER — LIDOCAINE-EPINEPHRINE 2 %-1:100000 IJ SOLN
1.0000 mL | Freq: Once | INTRAMUSCULAR | Status: AC
  Administered 2020-07-13: 1 mL

## 2020-07-13 NOTE — Patient Instructions (Signed)
Postprocedure instructions :    A Band-Aid should be left on for 12 hours. Injection therapy is not a cure itself. It is used in conjunction with other modalities. You can use nonsteroidal anti-inflammatories like ibuprofen , hot and cold compresses. Rest is recommended in the next 24 hours. You need to report immediately  if fever, chills or any signs of infection develop. 

## 2020-07-13 NOTE — Addendum Note (Signed)
Addended by: Deatra James on: 07/13/2020 03:16 PM   Modules accepted: Orders

## 2020-07-13 NOTE — Assessment & Plan Note (Signed)
Start Losartan 50 mg/day if BPs are >140/90 Toprol, Nifedipine, Furosemide 80 mg q am BP ok at home Clonidine prn for SBP>190

## 2020-07-13 NOTE — Assessment & Plan Note (Signed)
Depo-Estradiol 0.3 ml  Potential benefits of a long term HRT  use as well as potential risks  and complications were explained to the patient and were aknowledged.

## 2020-07-13 NOTE — Assessment & Plan Note (Signed)
Effusion See procedure

## 2020-07-13 NOTE — Progress Notes (Signed)
Subjective:  Patient ID: [name], female    DOB: 12/18/1945  Age: 75 y.o. MRN: [id number]  CC: Follow-up (4 week f/u)   HPI [name] presents for L knee tightness and swelling since her last accident - R knee is ok post-injection  F/u sweats  Outpatient Medications Prior to Visit  Medication Sig Dispense Refill  . aspirin 81 MG EC tablet Take 81 mg by mouth daily.    . Cholecalciferol 1000 UNITS tablet Take 1,000 Units by mouth daily.    . clobetasol ointment (TEMOVATE) 0.05 % Apply 1 application topically 2 (two) times daily. Apply as directed twice daily for up to 2 weeks, then space out to 1-2 x a week. Not for daily long term use. 30 g 0  . clorazepate (TRANXENE) 15 MG tablet Take 1 tablet (15 mg total) by mouth 3 (three) times daily as needed for anxiety (do not take with Lorazepam). for anxiety 270 tablet 1  . doxepin (SINEQUAN) 50 MG capsule Take 3 capsules (150 mg total) by mouth at bedtime. 270 capsule 3  . estradiol (ESTRACE) 0.1 MG/GM vaginal cream Place 1 Applicatorful vaginally at bedtime as needed. 42.5 g 0  . estradiol cypionate (DEPO-ESTRADIOL) 5 MG/ML injection Use 0.3 ml every 18-25 days IM 5 mL 3  . furosemide (LASIX) 40 MG tablet Take 1-2 tablets (40-80 mg total) by mouth daily. 180 tablet 3  . levothyroxine (SYNTHROID) 137 MCG tablet TAKE 1 TABLET BY MOUTH ONCE DAILY BEFORE BREAKFAST every other day and alternate with one half every other day 90 tablet 3  . LORazepam (ATIVAN) 0.5 MG tablet TAKE 1 TABLET BY MOUTH AT BEDTIME 90 tablet 1  . methylPREDNISolone (MEDROL DOSEPAK) 4 MG TBPK tablet As directed for a gout attack 21 tablet 2  . metoprolol tartrate (LOPRESSOR) 50 MG tablet Take 1 tablet (50 mg total) by mouth 3 (three) times daily. 270 tablet 3  . NIFEdipine (PROCARDIA-XL/NIFEDICAL-XL) 30 MG 24 hr tablet Take 1 tablet (30 mg total) by mouth daily. 90 tablet 3  . nitroGLYCERIN (NITROLINGUAL) 0.4 MG/SPRAY spray PLACE 1 SPRAY UNDER THE TONGUE EVERY 5 MINUTES  AS NEEDED FOR CHEST PAIN. MAY REPEAT 3 TIMES 12 g 1  . potassium chloride SA (KLOR-CON M20) 20 MEQ tablet Take 1 tablet (20 mEq total) by mouth daily. 90 tablet 3  . colchicine 0.6 MG tablet Take two tablets prn gout attack.Then take another one in 1-2 hrs. Do not repeat for 3 days. (Patient not taking: Reported on 07/13/2020) 18 tablet 1   No facility-administered medications prior to visit.    ROS: Review of Systems  Constitutional: Negative for activity change, appetite change, chills, fatigue and unexpected weight change.  HENT: Negative for congestion, mouth sores and sinus pressure.   Eyes: Negative for visual disturbance.  Respiratory: Negative for cough and chest tightness.   Gastrointestinal: Negative for abdominal pain and nausea.  Genitourinary: Negative for difficulty urinating, frequency and vaginal pain.  Musculoskeletal: Positive for arthralgias and gait problem. Negative for back pain.  Skin: Negative for pallor and rash.  Neurological: Negative for dizziness, tremors, weakness, numbness and headaches.  Psychiatric/Behavioral: Negative for confusion, sleep disturbance and suicidal ideas. The patient is not nervous/anxious.   L knee is swollen  Objective:  BP (!) 168/82 (BP Location: Left Arm)   Pulse 70   Temp 97.9 F (36.6 C) (Oral)   Wt 146 lb 9.6 oz (66.5 kg)   SpO2 97%   BMI 22.29 kg/m  BP Readings from Last 3 Encounters:  07/13/20 (!) 168/82  06/16/20 (!) 170/82  06/15/20 (!) 172/80    Wt Readings from Last 3 Encounters:  07/13/20 146 lb 9.6 oz (66.5 kg)  06/15/20 145 lb 12.8 oz (66.1 kg)  06/03/20 147 lb 12.8 oz (67 kg)    Physical Exam Constitutional:      General: She is not in acute distress.    Appearance: She is well-developed.  HENT:     Head: Normocephalic.     Right Ear: External ear normal.     Left Ear: External ear normal.     Nose: Nose normal.     Mouth/Throat:     Mouth: Oropharynx is clear and moist.  Eyes:     General:         Right eye: No discharge.        Left eye: No discharge.     Conjunctiva/sclera: Conjunctivae normal.     Pupils: Pupils are equal, round, and reactive to light.  Neck:     Thyroid: No thyromegaly.     Vascular: No JVD.     Trachea: No tracheal deviation.  Cardiovascular:     Rate and Rhythm: Normal rate and regular rhythm.     Heart sounds: Normal heart sounds.  Pulmonary:     Effort: No respiratory distress.     Breath sounds: No stridor. No wheezing.  Abdominal:     General: Bowel sounds are normal. There is no distension.     Palpations: Abdomen is soft. There is no mass.     Tenderness: There is no abdominal tenderness. There is no guarding or rebound.  Musculoskeletal:        General: Tenderness present. No edema.     Cervical back: Normal range of motion and neck supple.  Lymphadenopathy:     Cervical: No cervical adenopathy.  Skin:    Findings: No erythema or rash.  Neurological:     Mental Status: She is oriented to person, place, and time.     Cranial Nerves: No cranial nerve deficit.     Motor: No abnormal muscle tone.     Coordination: Coordination normal.     Gait: Gait abnormal.     Deep Tendon Reflexes: Reflexes normal.  Psychiatric:        Mood and Affect: Mood and affect normal.        Behavior: Behavior normal.        Thought Content: Thought content normal.        Judgment: Judgment normal.        Procedure : Joint Injection, L  knee   Indication:  Joint osteoarthritis with refractory  chronic pain.   Risks including unsuccessful procedure , bleeding, infection, bruising, skin atrophy, "steroid flare-up" and others were explained to the patient in detail as well as the benefits. Informed consent was obtained verbally.  Tthe patient was placed in a comfortable position. Lateral approach was used. Skin was prepped with Betadine and alcohol  and anesthetized a cooling spray. Then, a 10 cc syringe with a 1.5 inch long 25-gauge needle was used for a joint  aspiration/injection.. The needle was advanced  Into the knee joint cavity. I aspirated 10 cc of intra-articular fluid and injected the joint with 1 mL of 2% lidocaine and 40 mg of Depo-Medrol .  Band-Aid was applied.   Tolerated well. Complications: None. Good pain relief following the procedure.   Postprocedure instructions :    A Band-Aid should  be left on for 12 hours. Injection therapy is not a cure itself. It is used in conjunction with other modalities. You can use nonsteroidal anti-inflammatories like ibuprofen , hot and cold compresses. Rest is recommended in the next 24 hours. You need to report immediately  if fever, chills or any signs of infection develop. Lab Results  Component Value Date   WBC 5.6 12/29/2019   HGB 12.3 12/29/2019   HCT 37.0 12/29/2019   PLT 268 12/29/2019   GLUCOSE 96 05/16/2020   CHOL 235 (H) 12/29/2019   TRIG 100 12/29/2019   HDL 69 12/29/2019   LDLDIRECT 188.2 01/10/2012   LDLCALC 145 (H) 12/29/2019   ALT 11 12/29/2019   AST 14 12/29/2019   NA 140 05/16/2020   K 3.3 (L) 05/16/2020   CL 102 05/16/2020   CREATININE 1.36 (H) 05/16/2020   BUN 17 05/16/2020   CO2 30 05/16/2020   TSH <0.01 (L) 05/16/2020   INR 1.0 08/01/2018    CT RENAL STONE STUDY  Result Date: 08/01/2018 CLINICAL DATA:  Hematuria, loose stools EXAM: CT ABDOMEN AND PELVIS WITHOUT CONTRAST TECHNIQUE: Multidetector CT imaging of the abdomen and pelvis was performed following the standard protocol without IV contrast. COMPARISON:  12/24/2014 FINDINGS: Lower chest: No acute abnormality. Coronary artery calcifications. Hepatobiliary: No focal liver abnormality is seen. No gallstones, gallbladder wall thickening, or biliary dilatation. Pancreas: Unremarkable. No pancreatic ductal dilatation or surrounding inflammatory changes. Spleen: Normal in size without focal abnormality. Adrenals/Urinary Tract: Adrenal glands are unremarkable. Kidneys are normal, without renal calculi, focal lesion, or  hydronephrosis. Multiple small calculi in the dependent urinary bladder as seen on prior examination. Stomach/Bowel: Stomach is within normal limits. Appendix appears normal. No evidence of bowel wall thickening, distention, or inflammatory changes. Vascular/Lymphatic: Calcific atherosclerosis. No enlarged abdominal or pelvic lymph nodes. Reproductive: No mass or other abnormality. Status post hysterectomy. Other: No abdominal wall hernia or abnormality. No abdominopelvic ascites. Musculoskeletal: No acute or significant osseous findings. IMPRESSION: 1. No acute noncontrast CT findings of the abdomen or pelvis to explain hematuria or loose stools. 2. Multiple small calculi within the dependent urinary bladder, similar to prior study. Electronically Signed   By: Lauralyn Primes M.D.   On: 08/01/2018 15:42    Assessment & Plan:    Sonda Primes, MD

## 2020-08-16 ENCOUNTER — Encounter: Payer: Self-pay | Admitting: Internal Medicine

## 2020-08-16 ENCOUNTER — Ambulatory Visit (INDEPENDENT_AMBULATORY_CARE_PROVIDER_SITE_OTHER): Payer: Medicare Other | Admitting: Internal Medicine

## 2020-08-16 ENCOUNTER — Other Ambulatory Visit: Payer: Self-pay

## 2020-08-16 DIAGNOSIS — I10 Essential (primary) hypertension: Secondary | ICD-10-CM

## 2020-08-16 DIAGNOSIS — F5101 Primary insomnia: Secondary | ICD-10-CM | POA: Diagnosis not present

## 2020-08-16 DIAGNOSIS — F329 Major depressive disorder, single episode, unspecified: Secondary | ICD-10-CM

## 2020-08-16 DIAGNOSIS — E034 Atrophy of thyroid (acquired): Secondary | ICD-10-CM

## 2020-08-16 DIAGNOSIS — Z79890 Hormone replacement therapy (postmenopausal): Secondary | ICD-10-CM | POA: Diagnosis not present

## 2020-08-16 MED ORDER — CLORAZEPATE DIPOTASSIUM 15 MG PO TABS: 15.0000 mg | ORAL_TABLET | Freq: Three times a day (TID) | ORAL | 1 refills | Status: DC | PRN

## 2020-08-16 MED ORDER — DOXEPIN HCL 50 MG PO CAPS: 150.0000 mg | ORAL_CAPSULE | Freq: Every day | ORAL | 3 refills | Status: DC

## 2020-08-16 MED ORDER — NIFEDIPINE ER OSMOTIC RELEASE 30 MG PO TB24: 30.0000 mg | ORAL_TABLET | Freq: Every day | ORAL | 3 refills | Status: DC

## 2020-08-16 MED ORDER — DEPO-ESTRADIOL 5 MG/ML IM OIL: TOPICAL_OIL | INTRAMUSCULAR | 3 refills | Status: DC

## 2020-08-16 MED ORDER — POTASSIUM CHLORIDE CRYS ER 20 MEQ PO TBCR: 20.0000 meq | EXTENDED_RELEASE_TABLET | Freq: Every day | ORAL | 3 refills | Status: DC

## 2020-08-16 MED ORDER — ESTRADIOL CYPIONATE 5 MG/ML IM OIL
0.5000 mg | TOPICAL_OIL | Freq: Once | INTRAMUSCULAR | Status: AC
Start: 2020-08-16 — End: 2020-08-16
  Administered 2020-08-16: 0.5 mg via INTRAMUSCULAR

## 2020-08-16 MED ORDER — FUROSEMIDE 40 MG PO TABS: 40.0000 mg | ORAL_TABLET | Freq: Every day | ORAL | 3 refills | Status: DC

## 2020-08-16 MED ORDER — METOPROLOL TARTRATE 50 MG PO TABS: 50.0000 mg | ORAL_TABLET | Freq: Three times a day (TID) | ORAL | 3 refills | Status: DC

## 2020-08-16 MED ORDER — LORAZEPAM 0.5 MG PO TABS
0.5000 mg | ORAL_TABLET | Freq: Every day | ORAL | 1 refills | Status: DC
Start: 2020-08-16 — End: 2020-08-16

## 2020-08-16 MED ORDER — LEVOTHYROXINE SODIUM 137 MCG PO TABS: ORAL_TABLET | ORAL | 3 refills | Status: DC

## 2020-08-16 MED ORDER — LORAZEPAM 0.5 MG PO TABS: 0.5000 mg | ORAL_TABLET | Freq: Every day | ORAL | 1 refills | Status: DC

## 2020-08-16 MED ORDER — CLONIDINE HCL 0.1 MG PO TABS: 0.1000 mg | ORAL_TABLET | Freq: Three times a day (TID) | ORAL | 3 refills | Status: DC | PRN

## 2020-08-16 NOTE — Assessment & Plan Note (Signed)
Depo-Estradiol 0.3 ml IM  Potential benefits of a long term HRT  use as well as potential risks  and complications were explained to the patient and were aknowledged. Mammogr q 12 mo

## 2020-08-16 NOTE — Assessment & Plan Note (Signed)
On Levothroid 

## 2020-08-16 NOTE — Assessment & Plan Note (Addendum)
Toprol, Nifedipine, Furosemide 80 mg q am BP ok at home Clonidine prn for SBP>190

## 2020-08-16 NOTE — Assessment & Plan Note (Signed)
Doxepin at hs 

## 2020-08-16 NOTE — Progress Notes (Signed)
Subjective:  Patient ID: [name], female    DOB: 1946/03/06  Age: 75 y.o. MRN: [id number]  CC: Follow-up (4 week f/u)   HPI [name] presents for sweats, gout, hypothyroidism f/u C/o stress - cousin's husband died yesterday   Outpatient Medications Prior to Visit  Medication Sig Dispense Refill  . aspirin 81 MG EC tablet Take 81 mg by mouth daily.    . Cholecalciferol 1000 UNITS tablet Take 1,000 Units by mouth daily.    . clobetasol ointment (TEMOVATE) 0.05 % Apply 1 application topically 2 (two) times daily. Apply as directed twice daily for up to 2 weeks, then space out to 1-2 x a week. Not for daily long term use. 30 g 0  . clorazepate (TRANXENE) 15 MG tablet Take 1 tablet (15 mg total) by mouth 3 (three) times daily as needed for anxiety (do not take with Lorazepam). for anxiety 270 tablet 1  . doxepin (SINEQUAN) 50 MG capsule Take 3 capsules (150 mg total) by mouth at bedtime. 270 capsule 3  . estradiol (ESTRACE) 0.1 MG/GM vaginal cream Place 1 Applicatorful vaginally at bedtime as needed. 42.5 g 0  . estradiol cypionate (DEPO-ESTRADIOL) 5 MG/ML injection Use 0.3 ml every 18-25 days IM 5 mL 3  . furosemide (LASIX) 40 MG tablet Take 1-2 tablets (40-80 mg total) by mouth daily. 180 tablet 3  . levothyroxine (SYNTHROID) 137 MCG tablet TAKE 1 TABLET BY MOUTH ONCE DAILY BEFORE BREAKFAST every other day and alternate with one half every other day 90 tablet 3  . LORazepam (ATIVAN) 0.5 MG tablet TAKE 1 TABLET BY MOUTH AT BEDTIME 90 tablet 1  . metoprolol tartrate (LOPRESSOR) 50 MG tablet Take 1 tablet (50 mg total) by mouth 3 (three) times daily. 270 tablet 3  . NIFEdipine (PROCARDIA-XL/NIFEDICAL-XL) 30 MG 24 hr tablet Take 1 tablet (30 mg total) by mouth daily. 90 tablet 3  . nitroGLYCERIN (NITROLINGUAL) 0.4 MG/SPRAY spray PLACE 1 SPRAY UNDER THE TONGUE EVERY 5 MINUTES AS NEEDED FOR CHEST PAIN. MAY REPEAT 3 TIMES 12 g 1  . potassium chloride SA (KLOR-CON M20) 20 MEQ tablet Take 1  tablet (20 mEq total) by mouth daily. 90 tablet 3  . colchicine 0.6 MG tablet Take two tablets prn gout attack.Then take another one in 1-2 hrs. Do not repeat for 3 days. (Patient not taking: No sig reported) 18 tablet 1  . methylPREDNISolone (MEDROL DOSEPAK) 4 MG TBPK tablet As directed for a gout attack (Patient not taking: Reported on 08/16/2020) 21 tablet 2   No facility-administered medications prior to visit.    ROS: Review of Systems  Constitutional: Negative for activity change, appetite change, chills, fatigue and unexpected weight change.  HENT: Negative for congestion, mouth sores and sinus pressure.   Eyes: Negative for visual disturbance.  Respiratory: Negative for cough and chest tightness.   Gastrointestinal: Negative for abdominal pain and nausea.  Genitourinary: Negative for difficulty urinating, frequency and vaginal pain.  Musculoskeletal: Positive for arthralgias. Negative for back pain and gait problem.  Skin: Negative for pallor and rash.  Neurological: Negative for dizziness, tremors, weakness, numbness and headaches.  Psychiatric/Behavioral: Negative for confusion, sleep disturbance and suicidal ideas. The patient is nervous/anxious.     Objective:  BP (!) 200/98 (BP Location: Left Arm)   Pulse 97   Temp 98.3 F (36.8 C) (Oral)   Wt 144 lb (65.3 kg)   SpO2 97%   BMI 21.90 kg/m   BP Readings from Last 3  Encounters:  08/16/20 (!) 200/98  07/13/20 (!) 168/82  06/16/20 (!) 170/82    Wt Readings from Last 3 Encounters:  08/16/20 144 lb (65.3 kg)  07/13/20 146 lb 9.6 oz (66.5 kg)  06/15/20 145 lb 12.8 oz (66.1 kg)    Physical Exam Constitutional:      General: She is not in acute distress.    Appearance: She is well-developed.  HENT:     Head: Normocephalic.     Right Ear: External ear normal.     Left Ear: External ear normal.     Nose: Nose normal.  Eyes:     General:        Right eye: No discharge.        Left eye: No discharge.      Conjunctiva/sclera: Conjunctivae normal.     Pupils: Pupils are equal, round, and reactive to light.  Neck:     Thyroid: No thyromegaly.     Vascular: No JVD.     Trachea: No tracheal deviation.  Cardiovascular:     Rate and Rhythm: Normal rate and regular rhythm.     Heart sounds: Normal heart sounds.  Pulmonary:     Effort: No respiratory distress.     Breath sounds: No stridor. No wheezing.  Abdominal:     General: Bowel sounds are normal. There is no distension.     Palpations: Abdomen is soft. There is no mass.     Tenderness: There is no abdominal tenderness. There is no guarding or rebound.  Musculoskeletal:        General: Tenderness present.     Cervical back: Normal range of motion and neck supple.  Lymphadenopathy:     Cervical: No cervical adenopathy.  Skin:    Findings: No erythema or rash.  Neurological:     Mental Status: She is oriented to person, place, and time.     Cranial Nerves: No cranial nerve deficit.     Motor: No abnormal muscle tone.     Coordination: Coordination normal.     Deep Tendon Reflexes: Reflexes normal.  Psychiatric:        Behavior: Behavior normal.        Thought Content: Thought content normal.        Judgment: Judgment normal.    tearful  Lab Results  Component Value Date   WBC 5.6 12/29/2019   HGB 12.3 12/29/2019   HCT 37.0 12/29/2019   PLT 268 12/29/2019   GLUCOSE 96 05/16/2020   CHOL 235 (H) 12/29/2019   TRIG 100 12/29/2019   HDL 69 12/29/2019   LDLDIRECT 188.2 01/10/2012   LDLCALC 145 (H) 12/29/2019   ALT 11 12/29/2019   AST 14 12/29/2019   NA 140 05/16/2020   K 3.3 (L) 05/16/2020   CL 102 05/16/2020   CREATININE 1.36 (H) 05/16/2020   BUN 17 05/16/2020   CO2 30 05/16/2020   TSH <0.01 (L) 05/16/2020   INR 1.0 08/01/2018    CT RENAL STONE STUDY  Result Date: 08/01/2018 CLINICAL DATA:  Hematuria, loose stools EXAM: CT ABDOMEN AND PELVIS WITHOUT CONTRAST TECHNIQUE: Multidetector CT imaging of the abdomen and  pelvis was performed following the standard protocol without IV contrast. COMPARISON:  12/24/2014 FINDINGS: Lower chest: No acute abnormality. Coronary artery calcifications. Hepatobiliary: No focal liver abnormality is seen. No gallstones, gallbladder wall thickening, or biliary dilatation. Pancreas: Unremarkable. No pancreatic ductal dilatation or surrounding inflammatory changes. Spleen: Normal in size without focal abnormality. Adrenals/Urinary Tract: Adrenal glands are  unremarkable. Kidneys are normal, without renal calculi, focal lesion, or hydronephrosis. Multiple small calculi in the dependent urinary bladder as seen on prior examination. Stomach/Bowel: Stomach is within normal limits. Appendix appears normal. No evidence of bowel wall thickening, distention, or inflammatory changes. Vascular/Lymphatic: Calcific atherosclerosis. No enlarged abdominal or pelvic lymph nodes. Reproductive: No mass or other abnormality. Status post hysterectomy. Other: No abdominal wall hernia or abnormality. No abdominopelvic ascites. Musculoskeletal: No acute or significant osseous findings. IMPRESSION: 1. No acute noncontrast CT findings of the abdomen or pelvis to explain hematuria or loose stools. 2. Multiple small calculi within the dependent urinary bladder, similar to prior study. Electronically Signed   By: Lauralyn Primes M.D.   On: 08/01/2018 15:42    Assessment & Plan:     Follow-up: No follow-ups on file.  Sonda Primes, MD

## 2020-08-24 ENCOUNTER — Ambulatory Visit: Payer: Medicare Other | Admitting: Cardiovascular Disease

## 2020-08-26 ENCOUNTER — Other Ambulatory Visit: Payer: Self-pay | Admitting: Internal Medicine

## 2020-08-29 ENCOUNTER — Telehealth: Payer: Self-pay | Admitting: Internal Medicine

## 2020-08-29 MED ORDER — LEVOTHYROXINE SODIUM 137 MCG PO TABS: ORAL_TABLET | ORAL | 3 refills | Status: DC

## 2020-08-29 NOTE — Telephone Encounter (Signed)
1.Medication Requested: levothyroxine (SYNTHROID) 137 MCG tablet    2. Pharmacy (Name, Street, [place]): [facility] [address], [place] - [address]  3. On Med List: yes   4. Last Visit with PCP: 08-16-20  5. Next visit date with PCP: 09-13-20   Agent: Please be advised that RX refills may take up to 3 business days. We ask that you follow-up with your pharmacy.

## 2020-08-29 NOTE — Telephone Encounter (Signed)
Rx sent to [facility]

## 2020-08-29 NOTE — Addendum Note (Signed)
Addended by: Deatra James on: 08/29/2020 02:05 PM   Modules accepted: Orders

## 2020-09-13 ENCOUNTER — Ambulatory Visit (INDEPENDENT_AMBULATORY_CARE_PROVIDER_SITE_OTHER): Payer: Medicare Other | Admitting: Internal Medicine

## 2020-09-13 ENCOUNTER — Other Ambulatory Visit: Payer: Self-pay

## 2020-09-13 ENCOUNTER — Encounter: Payer: Self-pay | Admitting: Internal Medicine

## 2020-09-13 VITALS — BP 170/82 | HR 69 | Temp 97.8°F | Ht 68.0 in | Wt 145.8 lb

## 2020-09-13 DIAGNOSIS — F419 Anxiety disorder, unspecified: Secondary | ICD-10-CM

## 2020-09-13 DIAGNOSIS — I2510 Atherosclerotic heart disease of native coronary artery without angina pectoris: Secondary | ICD-10-CM

## 2020-09-13 DIAGNOSIS — I10 Essential (primary) hypertension: Secondary | ICD-10-CM

## 2020-09-13 DIAGNOSIS — T466X5A Adverse effect of antihyperlipidemic and antiarteriosclerotic drugs, initial encounter: Secondary | ICD-10-CM

## 2020-09-13 DIAGNOSIS — M10072 Idiopathic gout, left ankle and foot: Secondary | ICD-10-CM

## 2020-09-13 DIAGNOSIS — G720 Drug-induced myopathy: Secondary | ICD-10-CM | POA: Diagnosis not present

## 2020-09-13 DIAGNOSIS — Z79890 Hormone replacement therapy (postmenopausal): Secondary | ICD-10-CM | POA: Diagnosis not present

## 2020-09-13 DIAGNOSIS — E876 Hypokalemia: Secondary | ICD-10-CM

## 2020-09-13 LAB — BASIC METABOLIC PANEL
BUN: 14 mg/dL (ref 6–23)
CO2: 31 mEq/L (ref 19–32)
Calcium: 9 mg/dL (ref 8.4–10.5)
Chloride: 101 mEq/L (ref 96–112)
Creatinine, Ser: 1.26 mg/dL — ABNORMAL HIGH (ref 0.40–1.20)
GFR: 41.31 mL/min — ABNORMAL LOW (ref >60.00)
Glucose, Bld: 112 mg/dL — ABNORMAL HIGH (ref 70–99)
Potassium: 3.7 mEq/L (ref 3.5–5.1)
Sodium: 140 mEq/L (ref 135–145)

## 2020-09-13 MED ORDER — ESTRADIOL CYPIONATE 5 MG/ML IM OIL
0.5000 mg | TOPICAL_OIL | Freq: Once | INTRAMUSCULAR | Status: AC
  Administered 2020-09-13: 0.5 mg via INTRAMUSCULAR

## 2020-09-13 NOTE — Assessment & Plan Note (Signed)
Statin intol Repatha discussed - refused

## 2020-09-13 NOTE — Assessment & Plan Note (Signed)
Not on statins 

## 2020-09-13 NOTE — Progress Notes (Signed)
Subjective:  Patient ID: [name], female    DOB: 1946-03-20  Age: 75 y.o. MRN: [id number]  CC: Follow-up (4 week f/u)   HPI Justus C Maners presents for diaphoresis, anxiety, hypothyroidism, gout f/u  Outpatient Medications Prior to Visit  Medication Sig Dispense Refill  . aspirin 81 MG EC tablet Take 81 mg by mouth daily.    . Cholecalciferol 1000 UNITS tablet Take 1,000 Units by mouth daily.    . clobetasol ointment (TEMOVATE) 0.05 % Apply 1 application topically 2 (two) times daily. Apply as directed twice daily for up to 2 weeks, then space out to 1-2 x a week. Not for daily long term use. 30 g 0  . cloNIDine (CATAPRES) 0.1 MG tablet Take 1 tablet (0.1 mg total) by mouth 3 (three) times daily as needed (take for BP>180/110). 90 tablet 3  . clorazepate (TRANXENE) 15 MG tablet Take 1 tablet (15 mg total) by mouth 3 (three) times daily as needed for anxiety (do not take with Lorazepam). for anxiety 270 tablet 1  . doxepin (SINEQUAN) 50 MG capsule Take 3 capsules (150 mg total) by mouth at bedtime. 270 capsule 3  . estradiol (ESTRACE) 0.1 MG/GM vaginal cream Place 1 Applicatorful vaginally at bedtime as needed. 42.5 g 0  . estradiol cypionate (DEPO-ESTRADIOL) 5 MG/ML injection Use 0.3 ml every 18-25 days IM 5 mL 3  . furosemide (LASIX) 40 MG tablet Take 1-2 tablets (40-80 mg total) by mouth daily. 180 tablet 3  . levothyroxine (SYNTHROID) 137 MCG tablet TAKE 1 TABLET BY MOUTH ONCE DAILY BEFORE BREAKFAST every other day and alternate with one half every other day 90 tablet 3  . LORazepam (ATIVAN) 0.5 MG tablet Take 1 tablet (0.5 mg total) by mouth at bedtime. 90 tablet 1  . metoprolol tartrate (LOPRESSOR) 50 MG tablet Take 1 tablet (50 mg total) by mouth 3 (three) times daily. 270 tablet 3  . NIFEdipine (PROCARDIA-XL/NIFEDICAL-XL) 30 MG 24 hr tablet Take 1 tablet (30 mg total) by mouth daily. 90 tablet 3  . nitroGLYCERIN (NITROLINGUAL) 0.4 MG/SPRAY spray PLACE 1 SPRAY UNDER THE TONGUE EVERY  5 MINUTES AS NEEDED FOR CHEST PAIN. MAY REPEAT 3 TIMES 12 g 1  . potassium chloride SA (KLOR-CON M20) 20 MEQ tablet Take 1 tablet (20 mEq total) by mouth daily. 90 tablet 3   No facility-administered medications prior to visit.    ROS: Review of Systems  Constitutional: Positive for diaphoresis. Negative for activity change, appetite change, chills, fatigue and unexpected weight change.  HENT: Negative for congestion, mouth sores and sinus pressure.   Eyes: Negative for visual disturbance.  Respiratory: Negative for cough and chest tightness.   Gastrointestinal: Negative for abdominal pain and nausea.  Genitourinary: Negative for difficulty urinating, frequency and vaginal pain.  Musculoskeletal: Positive for arthralgias. Negative for back pain and gait problem.  Skin: Negative for pallor and rash.  Neurological: Negative for dizziness, tremors, weakness, numbness and headaches.  Psychiatric/Behavioral: Negative for confusion, sleep disturbance and suicidal ideas. The patient is nervous/anxious.     Objective:  BP (!) 170/82 (BP Location: Left Arm)   Pulse 69   Temp 97.8 F (36.6 C) (Oral)   Ht 5\' 8"  (1.727 m)   Wt 145 lb 12.8 oz (66.1 kg)   SpO2 95%   BMI 22.17 kg/m   BP Readings from Last 3 Encounters:  09/13/20 (!) 170/82  08/16/20 (!) 200/98  07/13/20 (!) 168/82    Wt Readings from Last 3 Encounters:  09/13/20 145 lb 12.8 oz (66.1 kg)  08/16/20 144 lb (65.3 kg)  07/13/20 146 lb 9.6 oz (66.5 kg)    Physical Exam Constitutional:      General: She is not in acute distress.    Appearance: She is well-developed.  HENT:     Head: Normocephalic.     Right Ear: External ear normal.     Left Ear: External ear normal.     Nose: Nose normal.  Eyes:     General:        Right eye: No discharge.        Left eye: No discharge.     Conjunctiva/sclera: Conjunctivae normal.     Pupils: Pupils are equal, round, and reactive to light.  Neck:     Thyroid: No thyromegaly.      Vascular: No JVD.     Trachea: No tracheal deviation.  Cardiovascular:     Rate and Rhythm: Normal rate and regular rhythm.     Heart sounds: Normal heart sounds.  Pulmonary:     Effort: No respiratory distress.     Breath sounds: No stridor. No wheezing.  Abdominal:     General: Bowel sounds are normal. There is no distension.     Palpations: Abdomen is soft. There is no mass.     Tenderness: There is no abdominal tenderness. There is no guarding or rebound.  Musculoskeletal:        General: Tenderness present.     Cervical back: Normal range of motion and neck supple.     Right lower leg: Edema present.     Left lower leg: Edema present.  Lymphadenopathy:     Cervical: No cervical adenopathy.  Skin:    Findings: No erythema or rash.  Neurological:     Cranial Nerves: No cranial nerve deficit.     Motor: No abnormal muscle tone.     Coordination: Coordination normal.     Deep Tendon Reflexes: Reflexes normal.  Psychiatric:        Behavior: Behavior normal.        Thought Content: Thought content normal.        Judgment: Judgment normal.      Lab Results  Component Value Date   WBC 5.6 12/29/2019   HGB 12.3 12/29/2019   HCT 37.0 12/29/2019   PLT 268 12/29/2019   GLUCOSE 96 05/16/2020   CHOL 235 (H) 12/29/2019   TRIG 100 12/29/2019   HDL 69 12/29/2019   LDLDIRECT 188.2 01/10/2012   LDLCALC 145 (H) 12/29/2019   ALT 11 12/29/2019   AST 14 12/29/2019   NA 140 05/16/2020   K 3.3 (L) 05/16/2020   CL 102 05/16/2020   CREATININE 1.36 (H) 05/16/2020   BUN 17 05/16/2020   CO2 30 05/16/2020   TSH <0.01 (L) 05/16/2020   INR 1.0 08/01/2018    CT RENAL STONE STUDY  Result Date: 08/01/2018 CLINICAL DATA:  Hematuria, loose stools EXAM: CT ABDOMEN AND PELVIS WITHOUT CONTRAST TECHNIQUE: Multidetector CT imaging of the abdomen and pelvis was performed following the standard protocol without IV contrast. COMPARISON:  12/24/2014 FINDINGS: Lower chest: No acute abnormality.  Coronary artery calcifications. Hepatobiliary: No focal liver abnormality is seen. No gallstones, gallbladder wall thickening, or biliary dilatation. Pancreas: Unremarkable. No pancreatic ductal dilatation or surrounding inflammatory changes. Spleen: Normal in size without focal abnormality. Adrenals/Urinary Tract: Adrenal glands are unremarkable. Kidneys are normal, without renal calculi, focal lesion, or hydronephrosis. Multiple small calculi in the dependent urinary bladder  as seen on prior examination. Stomach/Bowel: Stomach is within normal limits. Appendix appears normal. No evidence of bowel wall thickening, distention, or inflammatory changes. Vascular/Lymphatic: Calcific atherosclerosis. No enlarged abdominal or pelvic lymph nodes. Reproductive: No mass or other abnormality. Status post hysterectomy. Other: No abdominal wall hernia or abnormality. No abdominopelvic ascites. Musculoskeletal: No acute or significant osseous findings. IMPRESSION: 1. No acute noncontrast CT findings of the abdomen or pelvis to explain hematuria or loose stools. 2. Multiple small calculi within the dependent urinary bladder, similar to prior study. Electronically Signed   By: Lauralyn Primes M.D.   On: 08/01/2018 15:42    Assessment & Plan:     Meds ordered this encounter  Medications  . estradiol cypionate (DEPO-ESTRADIOL) 5 MG/ML injection 0.5 mg     Sonda Primes, MD

## 2020-09-13 NOTE — Assessment & Plan Note (Signed)
Toprol, Nifedipine, Furosemide 80 mg q am BP ok at home allegedly

## 2020-09-13 NOTE — Assessment & Plan Note (Signed)
Medrol pack prn

## 2020-09-13 NOTE — Assessment & Plan Note (Signed)
Check BMET 

## 2020-09-13 NOTE — Assessment & Plan Note (Signed)
Lorazepam prn at hs only, Doxepine at hs

## 2020-09-27 ENCOUNTER — Ambulatory Visit: Payer: Medicare Other | Admitting: Internal Medicine

## 2020-09-28 ENCOUNTER — Other Ambulatory Visit: Payer: Self-pay

## 2020-09-28 ENCOUNTER — Ambulatory Visit (INDEPENDENT_AMBULATORY_CARE_PROVIDER_SITE_OTHER): Payer: Medicare Other | Admitting: Internal Medicine

## 2020-09-28 ENCOUNTER — Ambulatory Visit: Payer: Medicare Other | Admitting: Internal Medicine

## 2020-09-28 ENCOUNTER — Encounter: Payer: Self-pay | Admitting: Internal Medicine

## 2020-09-28 DIAGNOSIS — F419 Anxiety disorder, unspecified: Secondary | ICD-10-CM

## 2020-09-28 DIAGNOSIS — W540XXA Bitten by dog, initial encounter: Secondary | ICD-10-CM

## 2020-09-28 DIAGNOSIS — S61452D Open bite of left hand, subsequent encounter: Secondary | ICD-10-CM | POA: Insufficient documentation

## 2020-09-28 DIAGNOSIS — W540XXD Bitten by dog, subsequent encounter: Secondary | ICD-10-CM | POA: Insufficient documentation

## 2020-09-28 DIAGNOSIS — S61452A Open bite of left hand, initial encounter: Secondary | ICD-10-CM | POA: Diagnosis not present

## 2020-09-28 MED ORDER — MUPIROCIN 2 % EX OINT: TOPICAL_OINTMENT | CUTANEOUS | 0 refills | Status: DC

## 2020-09-28 MED ORDER — AMOXICILLIN-POT CLAVULANATE 875-125 MG PO TABS: 1.0000 | ORAL_TABLET | Freq: Two times a day (BID) | ORAL | 1 refills | Status: DC

## 2020-09-28 NOTE — Progress Notes (Signed)
Subjective:  Patient ID: [name], female    DOB: 1945/10/08  Age: 75 y.o. MRN: [id number]  CC: Hand Injury (Pt states her dog (poodle) attack her on Monday. He bite both of her arms but her (L) hand is worse. Red and swollen)   HPI [name] presents for a dog bite on Monday - L >R hand  Outpatient Medications Prior to Visit  Medication Sig Dispense Refill  . aspirin 81 MG EC tablet Take 81 mg by mouth daily.    . Cholecalciferol 1000 UNITS tablet Take 1,000 Units by mouth daily.    . clobetasol ointment (TEMOVATE) 0.05 % Apply 1 application topically 2 (two) times daily. Apply as directed twice daily for up to 2 weeks, then space out to 1-2 x a week. Not for daily long term use. 30 g 0  . cloNIDine (CATAPRES) 0.1 MG tablet Take 1 tablet (0.1 mg total) by mouth 3 (three) times daily as needed (take for BP>180/110). 90 tablet 3  . clorazepate (TRANXENE) 15 MG tablet Take 1 tablet (15 mg total) by mouth 3 (three) times daily as needed for anxiety (do not take with Lorazepam). for anxiety 270 tablet 1  . doxepin (SINEQUAN) 50 MG capsule Take 3 capsules (150 mg total) by mouth at bedtime. 270 capsule 3  . estradiol (ESTRACE) 0.1 MG/GM vaginal cream Place 1 Applicatorful vaginally at bedtime as needed. 42.5 g 0  . estradiol cypionate (DEPO-ESTRADIOL) 5 MG/ML injection Use 0.3 ml every 18-25 days IM 5 mL 3  . furosemide (LASIX) 40 MG tablet Take 1-2 tablets (40-80 mg total) by mouth daily. 180 tablet 3  . levothyroxine (SYNTHROID) 137 MCG tablet TAKE 1 TABLET BY MOUTH ONCE DAILY BEFORE BREAKFAST every other day and alternate with one half every other day 90 tablet 3  . LORazepam (ATIVAN) 0.5 MG tablet Take 1 tablet (0.5 mg total) by mouth at bedtime. 90 tablet 1  . metoprolol tartrate (LOPRESSOR) 50 MG tablet Take 1 tablet (50 mg total) by mouth 3 (three) times daily. 270 tablet 3  . NIFEdipine (PROCARDIA-XL/NIFEDICAL-XL) 30 MG 24 hr tablet Take 1 tablet (30 mg total) by mouth daily. 90  tablet 3  . nitroGLYCERIN (NITROLINGUAL) 0.4 MG/SPRAY spray PLACE 1 SPRAY UNDER THE TONGUE EVERY 5 MINUTES AS NEEDED FOR CHEST PAIN. MAY REPEAT 3 TIMES 12 g 1  . potassium chloride SA (KLOR-CON M20) 20 MEQ tablet Take 1 tablet (20 mEq total) by mouth daily. 90 tablet 3   No facility-administered medications prior to visit.    ROS: Review of Systems  Constitutional: Negative for chills and fever.  Skin: Positive for wound.  Psychiatric/Behavioral: Negative for suicidal ideas. The patient is nervous/anxious.     Objective:  There were no vitals taken for this visit.  BP Readings from Last 3 Encounters:  09/13/20 (!) 170/82  08/16/20 (!) 200/98  07/13/20 (!) 168/82    Wt Readings from Last 3 Encounters:  09/13/20 145 lb 12.8 oz (66.1 kg)  08/16/20 144 lb (65.3 kg)  07/13/20 146 lb 9.6 oz (66.5 kg)    Physical Exam Constitutional:      Appearance: Normal appearance. She is not ill-appearing or toxic-appearing.  Skin:    Findings: Erythema present.  Neurological:     Mental Status: She is oriented to person, place, and time.  Psychiatric:        Thought Content: Thought content normal.    A large abrasion L dorsal hand 3x3 cm One scratch  R hand - 1 cm  Lab Results  Component Value Date   WBC 5.6 12/29/2019   HGB 12.3 12/29/2019   HCT 37.0 12/29/2019   PLT 268 12/29/2019   GLUCOSE 112 (H) 09/13/2020   CHOL 235 (H) 12/29/2019   TRIG 100 12/29/2019   HDL 69 12/29/2019   LDLDIRECT 188.2 01/10/2012   LDLCALC 145 (H) 12/29/2019   ALT 11 12/29/2019   AST 14 12/29/2019   NA 140 09/13/2020   K 3.7 09/13/2020   CL 101 09/13/2020   CREATININE 1.26 (H) 09/13/2020   BUN 14 09/13/2020   CO2 31 09/13/2020   TSH <0.01 (L) 05/16/2020   INR 1.0 08/01/2018    CT RENAL STONE STUDY  Result Date: 08/01/2018 CLINICAL DATA:  Hematuria, loose stools EXAM: CT ABDOMEN AND PELVIS WITHOUT CONTRAST TECHNIQUE: Multidetector CT imaging of the abdomen and pelvis was performed following  the standard protocol without IV contrast. COMPARISON:  12/24/2014 FINDINGS: Lower chest: No acute abnormality. Coronary artery calcifications. Hepatobiliary: No focal liver abnormality is seen. No gallstones, gallbladder wall thickening, or biliary dilatation. Pancreas: Unremarkable. No pancreatic ductal dilatation or surrounding inflammatory changes. Spleen: Normal in size without focal abnormality. Adrenals/Urinary Tract: Adrenal glands are unremarkable. Kidneys are normal, without renal calculi, focal lesion, or hydronephrosis. Multiple small calculi in the dependent urinary bladder as seen on prior examination. Stomach/Bowel: Stomach is within normal limits. Appendix appears normal. No evidence of bowel wall thickening, distention, or inflammatory changes. Vascular/Lymphatic: Calcific atherosclerosis. No enlarged abdominal or pelvic lymph nodes. Reproductive: No mass or other abnormality. Status post hysterectomy. Other: No abdominal wall hernia or abnormality. No abdominopelvic ascites. Musculoskeletal: No acute or significant osseous findings. IMPRESSION: 1. No acute noncontrast CT findings of the abdomen or pelvis to explain hematuria or loose stools. 2. Multiple small calculi within the dependent urinary bladder, similar to prior study. Electronically Signed   By: Lauralyn Primes M.D.   On: 08/01/2018 15:42    Assessment & Plan:     Follow-up: No follow-ups on file.  Sonda Primes, MD

## 2020-09-28 NOTE — Assessment & Plan Note (Addendum)
A large abrasion L dorsal hand 3x3 cm One scratch R hand - 1 cm  Dressed the wound - large abrasion L dorsal hand tDAP

## 2020-09-29 ENCOUNTER — Ambulatory Visit (INDEPENDENT_AMBULATORY_CARE_PROVIDER_SITE_OTHER): Payer: Medicare Other | Admitting: Internal Medicine

## 2020-09-29 ENCOUNTER — Encounter: Payer: Self-pay | Admitting: Internal Medicine

## 2020-09-29 DIAGNOSIS — W540XXA Bitten by dog, initial encounter: Secondary | ICD-10-CM | POA: Diagnosis not present

## 2020-09-29 DIAGNOSIS — F419 Anxiety disorder, unspecified: Secondary | ICD-10-CM

## 2020-09-29 DIAGNOSIS — S61452A Open bite of left hand, initial encounter: Secondary | ICD-10-CM

## 2020-10-02 ENCOUNTER — Encounter: Payer: Self-pay | Admitting: Internal Medicine

## 2020-10-02 NOTE — Assessment & Plan Note (Signed)
Mackinze is stressed out about the dog situation.  It is natural.  Discussed.

## 2020-10-02 NOTE — Assessment & Plan Note (Signed)
Left dorsal hand wound with fibrin and granulation present.  Erythema and heat surrounding the wound have subside.  Wash wound with soap and water and pat dry Continue with Augmentin.  Continue to use Bactroban ointment topically

## 2020-10-02 NOTE — Assessment & Plan Note (Addendum)
Stress w/dogs discussed

## 2020-10-02 NOTE — Progress Notes (Signed)
Subjective:  Patient ID: [name], female    DOB: 08/19/1945  Age: 75 y.o. MRN: [id number]  CC: Follow-up   HPI HAYLYNN PHA presents for a dog bite wound recheck.  Outpatient Medications Prior to Visit  Medication Sig Dispense Refill  . amoxicillin-clavulanate (AUGMENTIN) 875-125 MG tablet Take 1 tablet by mouth 2 (two) times daily. 14 tablet 1  . aspirin 81 MG EC tablet Take 81 mg by mouth daily.    . Cholecalciferol 1000 UNITS tablet Take 1,000 Units by mouth daily.    . clobetasol ointment (TEMOVATE) 0.05 % Apply 1 application topically 2 (two) times daily. Apply as directed twice daily for up to 2 weeks, then space out to 1-2 x a week. Not for daily long term use. 30 g 0  . cloNIDine (CATAPRES) 0.1 MG tablet Take 1 tablet (0.1 mg total) by mouth 3 (three) times daily as needed (take for BP>180/110). 90 tablet 3  . clorazepate (TRANXENE) 15 MG tablet Take 1 tablet (15 mg total) by mouth 3 (three) times daily as needed for anxiety (do not take with Lorazepam). for anxiety 270 tablet 1  . doxepin (SINEQUAN) 50 MG capsule Take 3 capsules (150 mg total) by mouth at bedtime. 270 capsule 3  . estradiol (ESTRACE) 0.1 MG/GM vaginal cream Place 1 Applicatorful vaginally at bedtime as needed. 42.5 g 0  . estradiol cypionate (DEPO-ESTRADIOL) 5 MG/ML injection Use 0.3 ml every 18-25 days IM 5 mL 3  . furosemide (LASIX) 40 MG tablet Take 1-2 tablets (40-80 mg total) by mouth daily. 180 tablet 3  . levothyroxine (SYNTHROID) 137 MCG tablet TAKE 1 TABLET BY MOUTH ONCE DAILY BEFORE BREAKFAST every other day and alternate with one half every other day 90 tablet 3  . LORazepam (ATIVAN) 0.5 MG tablet Take 1 tablet (0.5 mg total) by mouth at bedtime. 90 tablet 1  . metoprolol tartrate (LOPRESSOR) 50 MG tablet Take 1 tablet (50 mg total) by mouth 3 (three) times daily. 270 tablet 3  . mupirocin ointment (BACTROBAN) 2 % On leg wound w/dressing change qd or bid 30 g 0  . NIFEdipine  (PROCARDIA-XL/NIFEDICAL-XL) 30 MG 24 hr tablet Take 1 tablet (30 mg total) by mouth daily. 90 tablet 3  . nitroGLYCERIN (NITROLINGUAL) 0.4 MG/SPRAY spray PLACE 1 SPRAY UNDER THE TONGUE EVERY 5 MINUTES AS NEEDED FOR CHEST PAIN. MAY REPEAT 3 TIMES 12 g 1  . potassium chloride SA (KLOR-CON M20) 20 MEQ tablet Take 1 tablet (20 mEq total) by mouth daily. 90 tablet 3   No facility-administered medications prior to visit.    ROS: Review of Systems  Constitutional: Negative for chills, diaphoresis and fever.  Skin: Positive for wound. Negative for color change.    Objective:  BP (!) 182/90 (BP Location: Left Arm)   Pulse 88   Temp 98.2 F (36.8 C) (Oral)   SpO2 95%   BP Readings from Last 3 Encounters:  09/29/20 (!) 182/90  09/28/20 (!) 182/84  09/13/20 (!) 170/82    Wt Readings from Last 3 Encounters:  09/13/20 145 lb 12.8 oz (66.1 kg)  08/16/20 144 lb (65.3 kg)  07/13/20 146 lb 9.6 oz (66.5 kg)    Physical Exam Musculoskeletal:        General: Tenderness present.  Skin:    Findings: Lesion present. No erythema.  Neurological:     Mental Status: She is oriented to person, place, and time.   Left dorsal hand wound with fibrin and granulation present.  Erythema and heat surrounding the wound have subsided.  Dressing change with Bactroban ointment, Telfa pad, gauze wrap performed.  Lab Results  Component Value Date   WBC 5.6 12/29/2019   HGB 12.3 12/29/2019   HCT 37.0 12/29/2019   PLT 268 12/29/2019   GLUCOSE 112 (H) 09/13/2020   CHOL 235 (H) 12/29/2019   TRIG 100 12/29/2019   HDL 69 12/29/2019   LDLDIRECT 188.2 01/10/2012   LDLCALC 145 (H) 12/29/2019   ALT 11 12/29/2019   AST 14 12/29/2019   NA 140 09/13/2020   K 3.7 09/13/2020   CL 101 09/13/2020   CREATININE 1.26 (H) 09/13/2020   BUN 14 09/13/2020   CO2 31 09/13/2020   TSH <0.01 (L) 05/16/2020   INR 1.0 08/01/2018    CT RENAL STONE STUDY  Result Date: 08/01/2018 CLINICAL DATA:  Hematuria, loose stools  EXAM: CT ABDOMEN AND PELVIS WITHOUT CONTRAST TECHNIQUE: Multidetector CT imaging of the abdomen and pelvis was performed following the standard protocol without IV contrast. COMPARISON:  12/24/2014 FINDINGS: Lower chest: No acute abnormality. Coronary artery calcifications. Hepatobiliary: No focal liver abnormality is seen. No gallstones, gallbladder wall thickening, or biliary dilatation. Pancreas: Unremarkable. No pancreatic ductal dilatation or surrounding inflammatory changes. Spleen: Normal in size without focal abnormality. Adrenals/Urinary Tract: Adrenal glands are unremarkable. Kidneys are normal, without renal calculi, focal lesion, or hydronephrosis. Multiple small calculi in the dependent urinary bladder as seen on prior examination. Stomach/Bowel: Stomach is within normal limits. Appendix appears normal. No evidence of bowel wall thickening, distention, or inflammatory changes. Vascular/Lymphatic: Calcific atherosclerosis. No enlarged abdominal or pelvic lymph nodes. Reproductive: No mass or other abnormality. Status post hysterectomy. Other: No abdominal wall hernia or abnormality. No abdominopelvic ascites. Musculoskeletal: No acute or significant osseous findings. IMPRESSION: 1. No acute noncontrast CT findings of the abdomen or pelvis to explain hematuria or loose stools. 2. Multiple small calculi within the dependent urinary bladder, similar to prior study. Electronically Signed   By: Lauralyn Primes M.D.   On: 08/01/2018 15:42    Assessment & Plan:   Sonda Primes, MD

## 2020-10-03 ENCOUNTER — Ambulatory Visit (INDEPENDENT_AMBULATORY_CARE_PROVIDER_SITE_OTHER): Payer: Medicare Other | Admitting: Internal Medicine

## 2020-10-03 ENCOUNTER — Other Ambulatory Visit: Payer: Self-pay | Admitting: Internal Medicine

## 2020-10-03 ENCOUNTER — Encounter: Payer: Self-pay | Admitting: Internal Medicine

## 2020-10-03 ENCOUNTER — Other Ambulatory Visit: Payer: Self-pay

## 2020-10-03 VITALS — BP 200/90 | HR 82 | Temp 98.3°F

## 2020-10-03 DIAGNOSIS — W540XXD Bitten by dog, subsequent encounter: Secondary | ICD-10-CM

## 2020-10-03 DIAGNOSIS — F419 Anxiety disorder, unspecified: Secondary | ICD-10-CM

## 2020-10-03 DIAGNOSIS — S61452D Open bite of left hand, subsequent encounter: Secondary | ICD-10-CM | POA: Diagnosis not present

## 2020-10-03 DIAGNOSIS — M109 Gout, unspecified: Secondary | ICD-10-CM | POA: Diagnosis not present

## 2020-10-03 MED ORDER — MUPIROCIN 2 % EX OINT: TOPICAL_OINTMENT | CUTANEOUS | 0 refills | Status: DC

## 2020-10-03 MED ORDER — METHYLPREDNISOLONE ACETATE 80 MG/ML IJ SUSP
80.0000 mg | Freq: Once | INTRAMUSCULAR | Status: AC
  Administered 2020-10-03: 80 mg via INTRAMUSCULAR

## 2020-10-03 NOTE — Progress Notes (Signed)
Subjective:  Patient ID: [name], female    DOB: March 21, 1946  Age: 75 y.o. MRN: [id number]  CC: Follow-up (Dog bite and gout in left foot)   HPI Kimeka C Shelden presents for the L hand wound C/o gout in the L ankle -it has been swollen and very tender since the weekend. The patient continues to be distressed about her dog accident  Outpatient Medications Prior to Visit  Medication Sig Dispense Refill  . amoxicillin-clavulanate (AUGMENTIN) 875-125 MG tablet Take 1 tablet by mouth 2 (two) times daily. 14 tablet 1  . aspirin 81 MG EC tablet Take 81 mg by mouth daily.    . Cholecalciferol 1000 UNITS tablet Take 1,000 Units by mouth daily.    . clobetasol ointment (TEMOVATE) 0.05 % Apply 1 application topically 2 (two) times daily. Apply as directed twice daily for up to 2 weeks, then space out to 1-2 x a week. Not for daily long term use. 30 g 0  . cloNIDine (CATAPRES) 0.1 MG tablet Take 1 tablet (0.1 mg total) by mouth 3 (three) times daily as needed (take for BP>180/110). 90 tablet 3  . clorazepate (TRANXENE) 15 MG tablet Take 1 tablet (15 mg total) by mouth 3 (three) times daily as needed for anxiety (do not take with Lorazepam). for anxiety 270 tablet 1  . doxepin (SINEQUAN) 50 MG capsule Take 3 capsules (150 mg total) by mouth at bedtime. 270 capsule 3  . estradiol (ESTRACE) 0.1 MG/GM vaginal cream Place 1 Applicatorful vaginally at bedtime as needed. 42.5 g 0  . estradiol cypionate (DEPO-ESTRADIOL) 5 MG/ML injection Use 0.3 ml every 18-25 days IM 5 mL 3  . furosemide (LASIX) 40 MG tablet Take 1-2 tablets (40-80 mg total) by mouth daily. 180 tablet 3  . levothyroxine (SYNTHROID) 137 MCG tablet TAKE 1 TABLET BY MOUTH ONCE DAILY BEFORE BREAKFAST every other day and alternate with one half every other day 90 tablet 3  . LORazepam (ATIVAN) 0.5 MG tablet Take 1 tablet (0.5 mg total) by mouth at bedtime. 90 tablet 1  . metoprolol tartrate (LOPRESSOR) 50 MG tablet Take 1 tablet (50 mg total) by  mouth 3 (three) times daily. 270 tablet 3  . mupirocin ointment (BACTROBAN) 2 % On leg wound w/dressing change qd or bid 30 g 0  . NIFEdipine (PROCARDIA-XL/NIFEDICAL-XL) 30 MG 24 hr tablet Take 1 tablet (30 mg total) by mouth daily. 90 tablet 3  . nitroGLYCERIN (NITROLINGUAL) 0.4 MG/SPRAY spray PLACE 1 SPRAY UNDER THE TONGUE EVERY 5 MINUTES AS NEEDED FOR CHEST PAIN. MAY REPEAT 3 TIMES 12 g 1  . potassium chloride SA (KLOR-CON M20) 20 MEQ tablet Take 1 tablet (20 mEq total) by mouth daily. 90 tablet 3   No facility-administered medications prior to visit.    ROS: Review of Systems  Constitutional: Negative for fever.  Musculoskeletal: Positive for arthralgias.  Skin: Positive for wound.  Psychiatric/Behavioral: Positive for dysphoric mood. Negative for suicidal ideas. The patient is nervous/anxious.     Objective:  BP (!) 200/90 (BP Location: Left Arm, Patient Position: Sitting, Cuff Size: Normal)   Pulse 82   Temp 98.3 F (36.8 C) (Oral)   SpO2 98%   BP Readings from Last 3 Encounters:  10/03/20 (!) 200/90  09/29/20 (!) 182/90  09/28/20 (!) 182/84    Wt Readings from Last 3 Encounters:  09/13/20 145 lb 12.8 oz (66.1 kg)  08/16/20 144 lb (65.3 kg)  07/13/20 146 lb 9.6 oz (66.5 kg)  Physical Exam Constitutional:      General: She is not in acute distress.    Appearance: She is well-developed.  HENT:     Head: Normocephalic.     Right Ear: External ear normal.     Left Ear: External ear normal.     Nose: Nose normal.  Eyes:     General:        Right eye: No discharge.        Left eye: No discharge.     Conjunctiva/sclera: Conjunctivae normal.     Pupils: Pupils are equal, round, and reactive to light.  Neck:     Thyroid: No thyromegaly.     Vascular: No JVD.     Trachea: No tracheal deviation.  Cardiovascular:     Rate and Rhythm: Normal rate and regular rhythm.     Heart sounds: Normal heart sounds.  Pulmonary:     Effort: No respiratory distress.      Breath sounds: No stridor. No wheezing.  Abdominal:     General: Bowel sounds are normal. There is no distension.     Palpations: Abdomen is soft. There is no mass.     Tenderness: There is no abdominal tenderness. There is no guarding or rebound.  Musculoskeletal:        General: Tenderness present.     Cervical back: Normal range of motion and neck supple.     Left lower leg: Edema present.  Lymphadenopathy:     Cervical: No cervical adenopathy.  Skin:    Findings: No erythema or rash.  Neurological:     Mental Status: She is oriented to person, place, and time.     Cranial Nerves: No cranial nerve deficit.     Motor: No abnormal muscle tone.     Coordination: Coordination normal.     Deep Tendon Reflexes: Reflexes normal.  Psychiatric:        Behavior: Behavior normal.        Thought Content: Thought content normal.        Judgment: Judgment normal.    Wound is clean - re-dressed L ankle - red, painful To the patient-and is tearful  Lab Results  Component Value Date   WBC 5.6 12/29/2019   HGB 12.3 12/29/2019   HCT 37.0 12/29/2019   PLT 268 12/29/2019   GLUCOSE 112 (H) 09/13/2020   CHOL 235 (H) 12/29/2019   TRIG 100 12/29/2019   HDL 69 12/29/2019   LDLDIRECT 188.2 01/10/2012   LDLCALC 145 (H) 12/29/2019   ALT 11 12/29/2019   AST 14 12/29/2019   NA 140 09/13/2020   K 3.7 09/13/2020   CL 101 09/13/2020   CREATININE 1.26 (H) 09/13/2020   BUN 14 09/13/2020   CO2 31 09/13/2020   TSH <0.01 (L) 05/16/2020   INR 1.0 08/01/2018    CT RENAL STONE STUDY  Result Date: 08/01/2018 CLINICAL DATA:  Hematuria, loose stools EXAM: CT ABDOMEN AND PELVIS WITHOUT CONTRAST TECHNIQUE: Multidetector CT imaging of the abdomen and pelvis was performed following the standard protocol without IV contrast. COMPARISON:  12/24/2014 FINDINGS: Lower chest: No acute abnormality. Coronary artery calcifications. Hepatobiliary: No focal liver abnormality is seen. No gallstones, gallbladder wall  thickening, or biliary dilatation. Pancreas: Unremarkable. No pancreatic ductal dilatation or surrounding inflammatory changes. Spleen: Normal in size without focal abnormality. Adrenals/Urinary Tract: Adrenal glands are unremarkable. Kidneys are normal, without renal calculi, focal lesion, or hydronephrosis. Multiple small calculi in the dependent urinary bladder as seen on prior examination.  Stomach/Bowel: Stomach is within normal limits. Appendix appears normal. No evidence of bowel wall thickening, distention, or inflammatory changes. Vascular/Lymphatic: Calcific atherosclerosis. No enlarged abdominal or pelvic lymph nodes. Reproductive: No mass or other abnormality. Status post hysterectomy. Other: No abdominal wall hernia or abnormality. No abdominopelvic ascites. Musculoskeletal: No acute or significant osseous findings. IMPRESSION: 1. No acute noncontrast CT findings of the abdomen or pelvis to explain hematuria or loose stools. 2. Multiple small calculi within the dependent urinary bladder, similar to prior study. Electronically Signed   By: Lauralyn Primes M.D.   On: 08/01/2018 15:42    Assessment & Plan:    Follow-up: No follow-ups on file.  Sonda Primes, MD

## 2020-10-04 NOTE — Assessment & Plan Note (Signed)
Continue with dressing change.  Finish the antibiotic.  Topical Bactroban.

## 2020-10-04 NOTE — Assessment & Plan Note (Signed)
Gout attack.  Medrol pack.  Depo-Medrol 80 mg IM injection given

## 2020-10-04 NOTE — Assessment & Plan Note (Signed)
Once again we discussed stress and her psychologic trauma I hope she will get better soon

## 2020-10-11 ENCOUNTER — Ambulatory Visit: Payer: Medicare Other | Admitting: Internal Medicine

## 2020-10-14 ENCOUNTER — Ambulatory Visit: Payer: Medicare Other | Admitting: Internal Medicine

## 2020-10-21 ENCOUNTER — Other Ambulatory Visit: Payer: Self-pay

## 2020-10-21 ENCOUNTER — Encounter: Payer: Self-pay | Admitting: Internal Medicine

## 2020-10-21 ENCOUNTER — Ambulatory Visit (INDEPENDENT_AMBULATORY_CARE_PROVIDER_SITE_OTHER): Payer: Medicare Other | Admitting: Internal Medicine

## 2020-10-21 DIAGNOSIS — N951 Menopausal and female climacteric states: Secondary | ICD-10-CM

## 2020-10-21 DIAGNOSIS — I10 Essential (primary) hypertension: Secondary | ICD-10-CM | POA: Diagnosis not present

## 2020-10-21 DIAGNOSIS — W540XXD Bitten by dog, subsequent encounter: Secondary | ICD-10-CM

## 2020-10-21 DIAGNOSIS — S61452D Open bite of left hand, subsequent encounter: Secondary | ICD-10-CM | POA: Diagnosis not present

## 2020-10-21 DIAGNOSIS — F329 Major depressive disorder, single episode, unspecified: Secondary | ICD-10-CM

## 2020-10-21 DIAGNOSIS — S41151D Open bite of right upper arm, subsequent encounter: Secondary | ICD-10-CM | POA: Diagnosis not present

## 2020-10-21 MED ORDER — ESTRADIOL CYPIONATE 5 MG/ML IM OIL
5.0000 mg | TOPICAL_OIL | Freq: Once | INTRAMUSCULAR | Status: AC
  Administered 2020-10-21: 5 mg via INTRAMUSCULAR

## 2020-10-21 NOTE — Assessment & Plan Note (Signed)
Wound re check. 

## 2020-10-21 NOTE — Progress Notes (Signed)
Subjective:  Patient ID: [name], female    DOB: 06-23-1945  Age: 75 y.o. MRN: [id number]  CC: Follow-up (Dog bite, pt is doing better.)   HPI [name] presents for a dog bite, HTN, CAD f/u [facility] is getting a new dog  Outpatient Medications Prior to Visit  Medication Sig Dispense Refill  . aspirin 81 MG EC tablet Take 81 mg by mouth daily.    . Cholecalciferol 1000 UNITS tablet Take 1,000 Units by mouth daily.    . clobetasol ointment (TEMOVATE) 0.05 % Apply 1 application topically 2 (two) times daily. Apply as directed twice daily for up to 2 weeks, then space out to 1-2 x a week. Not for daily long term use. 30 g 0  . cloNIDine (CATAPRES) 0.1 MG tablet Take 1 tablet (0.1 mg total) by mouth 3 (three) times daily as needed (take for BP>180/110). 90 tablet 3  . clorazepate (TRANXENE) 15 MG tablet Take 1 tablet (15 mg total) by mouth 3 (three) times daily as needed for anxiety (do not take with Lorazepam). for anxiety 270 tablet 1  . doxepin (SINEQUAN) 50 MG capsule Take 3 capsules (150 mg total) by mouth at bedtime. 270 capsule 3  . estradiol (ESTRACE) 0.1 MG/GM vaginal cream Place 1 Applicatorful vaginally at bedtime as needed. 42.5 g 0  . estradiol cypionate (DEPO-ESTRADIOL) 5 MG/ML injection Use 0.3 ml every 18-25 days IM 5 mL 3  . furosemide (LASIX) 40 MG tablet Take 1-2 tablets (40-80 mg total) by mouth daily. 180 tablet 3  . levothyroxine (SYNTHROID) 137 MCG tablet TAKE 1 TABLET BY MOUTH ONCE DAILY BEFORE BREAKFAST every other day and alternate with one half every other day 90 tablet 3  . LORazepam (ATIVAN) 0.5 MG tablet Take 1 tablet (0.5 mg total) by mouth at bedtime. 90 tablet 1  . methylPREDNISolone (MEDROL DOSEPAK) 4 MG TBPK tablet USE AS DIRECTED FOR  A  GOUT  ATTACK 21 tablet 0  . metoprolol tartrate (LOPRESSOR) 50 MG tablet Take 1 tablet (50 mg total) by mouth 3 (three) times daily. 270 tablet 3  . mupirocin ointment (BACTROBAN) 2 % On leg wound w/dressing change qd or  bid 30 g 0  . NIFEdipine (PROCARDIA-XL/NIFEDICAL-XL) 30 MG 24 hr tablet Take 1 tablet (30 mg total) by mouth daily. 90 tablet 3  . nitroGLYCERIN (NITROLINGUAL) 0.4 MG/SPRAY spray PLACE 1 SPRAY UNDER THE TONGUE EVERY 5 MINUTES AS NEEDED FOR CHEST PAIN. MAY REPEAT 3 TIMES 12 g 1  . potassium chloride SA (KLOR-CON M20) 20 MEQ tablet Take 1 tablet (20 mEq total) by mouth daily. 90 tablet 3  . amoxicillin-clavulanate (AUGMENTIN) 875-125 MG tablet Take 1 tablet by mouth 2 (two) times daily. (Patient not taking: Reported on 10/21/2020) 14 tablet 1   No facility-administered medications prior to visit.    ROS: Review of Systems  Constitutional: Positive for fatigue and unexpected weight change. Negative for activity change, appetite change and chills.  HENT: Negative for congestion, mouth sores and sinus pressure.   Eyes: Negative for visual disturbance.  Respiratory: Negative for cough and chest tightness.   Gastrointestinal: Negative for abdominal pain and nausea.  Genitourinary: Negative for difficulty urinating, frequency and vaginal pain.  Musculoskeletal: Negative for back pain and gait problem.  Skin: Negative for pallor and rash.  Neurological: Negative for dizziness, tremors, weakness, numbness and headaches.  Psychiatric/Behavioral: Negative for confusion and sleep disturbance. The patient is nervous/anxious.     Objective:  BP (!) 162/82  Pulse 68   Temp 98 F (36.7 C) (Oral)   Ht 5\' 8"  (1.727 m)   Wt 141 lb (64 kg)   SpO2 98%   BMI 21.44 kg/m   BP Readings from Last 3 Encounters:  10/21/20 (!) 162/82  10/03/20 (!) 200/90  09/29/20 (!) 182/90    Wt Readings from Last 3 Encounters:  10/21/20 141 lb (64 kg)  09/13/20 145 lb 12.8 oz (66.1 kg)  08/16/20 144 lb (65.3 kg)    Physical Exam Constitutional:      General: She is not in acute distress.    Appearance: She is well-developed.  HENT:     Head: Normocephalic.     Right Ear: External ear normal.     Left Ear:  External ear normal.     Nose: Nose normal.  Eyes:     General:        Right eye: No discharge.        Left eye: No discharge.     Conjunctiva/sclera: Conjunctivae normal.     Pupils: Pupils are equal, round, and reactive to light.  Neck:     Thyroid: No thyromegaly.     Vascular: No JVD.     Trachea: No tracheal deviation.  Cardiovascular:     Rate and Rhythm: Normal rate and regular rhythm.     Heart sounds: Normal heart sounds.  Pulmonary:     Effort: No respiratory distress.     Breath sounds: No stridor. No wheezing.  Abdominal:     General: Bowel sounds are normal. There is no distension.     Palpations: Abdomen is soft. There is no mass.     Tenderness: There is no abdominal tenderness. There is no guarding or rebound.  Musculoskeletal:        General: No tenderness.     Cervical back: Normal range of motion and neck supple.  Lymphadenopathy:     Cervical: No cervical adenopathy.  Skin:    Findings: Lesion present. No erythema or rash.  Neurological:     Cranial Nerves: No cranial nerve deficit.     Motor: No abnormal muscle tone.     Coordination: Coordination normal.     Deep Tendon Reflexes: Reflexes normal.  Psychiatric:        Behavior: Behavior normal.        Thought Content: Thought content normal.        Judgment: Judgment normal.     Lab Results  Component Value Date   WBC 5.6 12/29/2019   HGB 12.3 12/29/2019   HCT 37.0 12/29/2019   PLT 268 12/29/2019   GLUCOSE 112 (H) 09/13/2020   CHOL 235 (H) 12/29/2019   TRIG 100 12/29/2019   HDL 69 12/29/2019   LDLDIRECT 188.2 01/10/2012   LDLCALC 145 (H) 12/29/2019   ALT 11 12/29/2019   AST 14 12/29/2019   NA 140 09/13/2020   K 3.7 09/13/2020   CL 101 09/13/2020   CREATININE 1.26 (H) 09/13/2020   BUN 14 09/13/2020   CO2 31 09/13/2020   TSH <0.01 (L) 05/16/2020   INR 1.0 08/01/2018    CT RENAL STONE STUDY  Result Date: 08/01/2018 CLINICAL DATA:  Hematuria, loose stools EXAM: CT ABDOMEN AND PELVIS  WITHOUT CONTRAST TECHNIQUE: Multidetector CT imaging of the abdomen and pelvis was performed following the standard protocol without IV contrast. COMPARISON:  12/24/2014 FINDINGS: Lower chest: No acute abnormality. Coronary artery calcifications. Hepatobiliary: No focal liver abnormality is seen. No gallstones, gallbladder wall thickening,  or biliary dilatation. Pancreas: Unremarkable. No pancreatic ductal dilatation or surrounding inflammatory changes. Spleen: Normal in size without focal abnormality. Adrenals/Urinary Tract: Adrenal glands are unremarkable. Kidneys are normal, without renal calculi, focal lesion, or hydronephrosis. Multiple small calculi in the dependent urinary bladder as seen on prior examination. Stomach/Bowel: Stomach is within normal limits. Appendix appears normal. No evidence of bowel wall thickening, distention, or inflammatory changes. Vascular/Lymphatic: Calcific atherosclerosis. No enlarged abdominal or pelvic lymph nodes. Reproductive: No mass or other abnormality. Status post hysterectomy. Other: No abdominal wall hernia or abnormality. No abdominopelvic ascites. Musculoskeletal: No acute or significant osseous findings. IMPRESSION: 1. No acute noncontrast CT findings of the abdomen or pelvis to explain hematuria or loose stools. 2. Multiple small calculi within the dependent urinary bladder, similar to prior study. Electronically Signed   By: Lauralyn Primes M.D.   On: 08/01/2018 15:42    Assessment & Plan:    Sonda Primes, MD

## 2020-10-21 NOTE — Assessment & Plan Note (Signed)
Getting a new dog - better

## 2020-10-21 NOTE — Assessment & Plan Note (Addendum)
Stable. The best we can do. Continue w/Toprol, Nifedipine, Furosemide 80 mg q am BP ok at home Clonidine prn for SBP>190

## 2020-10-21 NOTE — Assessment & Plan Note (Signed)
Symptomatic menopause. She would like to cont Depo-Estradiol 0.3 ml  Potential benefits of a long term HRT  use as well as potential risks  and complications were explained to the patient and were aknowledged.

## 2020-10-21 NOTE — Assessment & Plan Note (Signed)
Getting a new dog

## 2020-10-24 ENCOUNTER — Telehealth: Payer: Self-pay | Admitting: Internal Medicine

## 2020-10-24 NOTE — Telephone Encounter (Signed)
Cut on Left leg, 4.5 inches from knee cap  Hit a piece of wood 2.5 ft tall (where she keeps her new puppy housed)  Patient doesn't have products to re-bandage it, would like someone to do that for her  Scheduled with [facility] for Wednesday @ 9:20am  She has prescription antibiotic that Dr Posey Rea gave her   Patient wants to know if it s it okay to wait until Wednesday  Please call either way to let her know what she should do  [phone]

## 2020-10-25 ENCOUNTER — Other Ambulatory Visit: Payer: Self-pay

## 2020-10-25 NOTE — Telephone Encounter (Signed)
Duplicate msg.. see first mychart msg../lmb

## 2020-10-25 NOTE — Progress Notes (Signed)
Subjective:    Patient ID: [name], female    DOB: 10-12-1945, 75 y.o.   MRN: [id number]  HPI The patient is here for an acute visit.  May 9th - dog injured left hand.  This is healing.  Got new puppy sat.  Sunday night - [name] scrapped her left lower leg and sustained a skin tear.  [name] cleaned it the best [name] could and put a bandage on it.   [name] did ice it.    The swelling has improved.  [name] denies fever/chills.  No discharge or bleeding x 2 days   Medications and allergies reviewed with patient and updated if appropriate.  Patient Active Problem List   Diagnosis Date Noted  . Dog bite, hand, left, subsequent encounter 09/28/2020  . Arthritis of left knee 07/13/2020  . Pain and swelling of right knee 06/05/2020  . Left knee pain 06/05/2020  . Hypokalemia 05/18/2020  . Rash 03/24/2020  . Insomnia 02/25/2020  . Traumatic hematoma of buttock 12/31/2019  . Right foot pain 11/20/2019  . Bladder prolapse, female, acquired 06/25/2019  . Statin myopathy 04/18/2019  . Dog bite of arm 02/15/2019  . Right arm pain 02/06/2019  . Atrophic vaginitis 10/20/2018  . Urinary bladder stone 08/06/2018  . Hematuria, gross 08/06/2018  . Edema 04/21/2018  . Herpes zoster 02/20/2018  . Occipital neuralgia of left side 01/07/2018  . Hormone replacement therapy (HRT) 07/25/2017  . Hair loss 05/31/2017  . Upper respiratory infection 05/01/2017  . Achilles tendinitis 03/12/2017  . Gout 01/09/2017  . Shoulder effusion, right 12/28/2016  . Neck stiffness 12/28/2016  . Heel pain, chronic, left 12/11/2016  . Calcaneal bursitis (heel), left 11/13/2016  . Toenail avulsion, initial encounter 11/13/2016  . Traumatic hematoma of foot, right, sequela 09/27/2016  . Adnexal mass 01/04/2016  . Right adrenal mass (HCC) 12/29/2015  . Cataract 10/10/2015  . Abnormal abdominal x-ray 12/23/2014  . GERD (gastroesophageal reflux disease) 11/07/2012  . Hot flash, menopausal 09/10/2012  . Well adult exam  06/13/2011  . GAIT DISTURBANCE 11/22/2009  . PARESTHESIA 11/22/2009  . BACK PAIN, LUMBAR 11/03/2009  . Hypothyroidism 02/11/2007  . Dyslipidemia 02/11/2007  . Anxiety disorder 02/11/2007  . Depression 02/11/2007  . Essential hypertension 02/11/2007  . Coronary atherosclerosis 02/11/2007  . 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 CAROTID BRUIT 02/11/2007    Current Outpatient Medications on File Prior to Visit  Medication Sig Dispense Refill  . aspirin 81 MG EC tablet Take 81 mg by mouth daily.    . Cholecalciferol 1000 UNITS tablet Take 1,000 Units by mouth daily.    . clobetasol ointment (TEMOVATE) 0.05 % Apply 1 application topically 2 (two) times daily. Apply as directed twice daily for up to 2 weeks, then space out to 1-2 x a week. Not for daily long term use. 30 g 0  . cloNIDine (CATAPRES) 0.1 MG tablet Take 1 tablet (0.1 mg total) by mouth 3 (three) times daily as needed (take for BP>180/110). 90 tablet 3  . clorazepate (TRANXENE) 15 MG tablet Take 1 tablet (15 mg total) by mouth 3 (three) times daily as needed for anxiety (do not take with Lorazepam). for anxiety 270 tablet 1  . doxepin (SINEQUAN) 50 MG capsule Take 3 capsules (150 mg total) by mouth at bedtime. 270 capsule 3  . estradiol (ESTRACE) 0.1 MG/GM vaginal cream Place 1 Applicatorful vaginally at bedtime as needed. 42.5 g 0  . estradiol cypionate (DEPO-ESTRADIOL) 5 MG/ML injection Use 0.3 ml  every 18-25 days IM 5 mL 3  . furosemide (LASIX) 40 MG tablet Take 1-2 tablets (40-80 mg total) by mouth daily. 180 tablet 3  . levothyroxine (SYNTHROID) 137 MCG tablet TAKE 1 TABLET BY MOUTH ONCE DAILY BEFORE BREAKFAST every other day and alternate with one half every other day 90 tablet 3  . LORazepam (ATIVAN) 0.5 MG tablet Take 1 tablet (0.5 mg total) by mouth at bedtime. 90 tablet 1  . methylPREDNISolone (MEDROL DOSEPAK) 4 MG TBPK tablet USE AS DIRECTED FOR  A  GOUT  ATTACK 21 tablet 0  . metoprolol tartrate (LOPRESSOR) 50 MG  tablet Take 1 tablet (50 mg total) by mouth 3 (three) times daily. 270 tablet 3  . mupirocin ointment (BACTROBAN) 2 % On leg wound w/dressing change qd or bid 30 g 0  . NIFEdipine (PROCARDIA-XL/NIFEDICAL-XL) 30 MG 24 hr tablet Take 1 tablet (30 mg total) by mouth daily. 90 tablet 3  . nitroGLYCERIN (NITROLINGUAL) 0.4 MG/SPRAY spray PLACE 1 SPRAY UNDER THE TONGUE EVERY 5 MINUTES AS NEEDED FOR CHEST PAIN. MAY REPEAT 3 TIMES 12 g 1  . potassium chloride SA (KLOR-CON M20) 20 MEQ tablet Take 1 tablet (20 mEq total) by mouth daily. 90 tablet 3  . amoxicillin-clavulanate (AUGMENTIN) 875-125 MG tablet Take 1 tablet by mouth 2 (two) times daily. (Patient not taking: Reported on 10/26/2020) 14 tablet 1   No current facility-administered medications on file prior to visit.    Past Medical History:  Diagnosis Date  . Anxiety state, unspecified   . Bronchitis, acute   . Carotid bruit   . Coronary atherosclerosis of unspecified type of vessel, native or graft    multiple angioplasy procedures in 90's/early 2000's  . De Quervain's tenosynovitis   . Depressive disorder, not elsewhere classified   . Female bladder prolapse   . Lichen 2011   Vaginal  . Other and unspecified hyperlipidemia   . Unspecified essential hypertension   . Unspecified hypothyroidism     Past Surgical History:  Procedure Laterality Date  . MOUTH SURGERY    . NOSE SURGERY  1999  . TOTAL VAGINAL HYSTERECTOMY  1994    Social History   Socioeconomic History  . Marital status: Widowed    Spouse name: Not on file  . Number of children: Not on file  . Years of education: Not on file  . Highest education level: Not on file  Occupational History  . Not on file  Tobacco Use  . Smoking status: Former Smoker    Quit date: 07/22/1991    Years since quitting: 29.2  . Smokeless tobacco: Never Used  Vaping Use  . Vaping Use: Never used  Substance and Sexual Activity  . Alcohol use: No  . Drug use: No  . Sexual activity: Not  Currently    Birth control/protection: Surgical    Comment: partial hysterectomy  Other Topics Concern  . 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 Hypertension Mother   . Hypertension Father   . Hypertension Other   . Coronary artery disease Other  Review of Systems  Constitutional: Negative for chills and fever.  Skin: Positive for color change (mild bruising around wound) and wound.  Neurological: Negative for numbness.       Objective:   Vitals:   10/26/20 1318  BP: 140/72  Pulse: 79  Temp: 97.9 F (36.6 C)  SpO2: 99%   BP Readings from Last 3 Encounters:  10/26/20 140/72  10/21/20 (!) 162/82  10/03/20 (!) 200/90   Wt Readings from Last 3 Encounters:  10/26/20 141 lb (64 kg)  10/21/20 141 lb (64 kg)  09/13/20 145 lb 12.8 oz (66.1 kg)   Body mass index is 21.44 kg/m.   Physical Exam Constitutional:      Appearance: Normal appearance.  HENT:     Head: Normocephalic and atraumatic.  Musculoskeletal:     Right lower leg: No edema.     Left lower leg: Edema (left foot and ankle - mild) present.  Skin:    General: Skin is warm and dry.     Comments: 1 x 1 1/4 inch skin tear on left mid lower anterior leg. No discharge or bleeding.  Granulation tissue present.  Mild surrounding bruising - no erythema.  Mild swelling around wound  Neurological:     Mental Status: [name] is alert.            Assessment & Plan:    LLE skin tear w/o infection: Acute Occurred three days ago No evidence of infection Wound looks good and there is evidence of granulation tissue Discussed Tdap-[name] has discussed this  with her PCP and they are both concerned about risks so decided to defer and we will defer today as well No need for antibiotics [name] will continue to keep it bandaged with a nonstick pad and antibacterial ointment [name] will monitor closely and return if there is any concerns about possible infection or if its not healing Discussed that this will take longer to heal since it is on the lower extremity and [name] should elevate her leg when sitting     This visit occurred during the SARS-CoV-2 public health emergency.  Safety protocols were in place, including screening questions prior to the visit, additional usage of staff PPE, and extensive cleaning of exam room while observing appropriate contact time as indicated for disinfecting solutions.

## 2020-10-26 ENCOUNTER — Other Ambulatory Visit: Payer: Self-pay

## 2020-10-26 ENCOUNTER — Encounter: Payer: Self-pay | Admitting: Internal Medicine

## 2020-10-26 ENCOUNTER — Ambulatory Visit (INDEPENDENT_AMBULATORY_CARE_PROVIDER_SITE_OTHER): Payer: Medicare Other | Admitting: Internal Medicine

## 2020-10-26 VITALS — BP 140/72 | HR 79 | Temp 97.9°F | Ht 68.0 in | Wt 141.0 lb

## 2020-10-26 DIAGNOSIS — S81812A Laceration without foreign body, left lower leg, initial encounter: Secondary | ICD-10-CM | POA: Diagnosis not present

## 2020-11-24 ENCOUNTER — Encounter: Payer: Self-pay | Admitting: Internal Medicine

## 2020-11-24 ENCOUNTER — Ambulatory Visit (INDEPENDENT_AMBULATORY_CARE_PROVIDER_SITE_OTHER): Payer: Medicare Other | Admitting: Internal Medicine

## 2020-11-24 ENCOUNTER — Other Ambulatory Visit: Payer: Self-pay

## 2020-11-24 DIAGNOSIS — F419 Anxiety disorder, unspecified: Secondary | ICD-10-CM

## 2020-11-24 DIAGNOSIS — F329 Major depressive disorder, single episode, unspecified: Secondary | ICD-10-CM

## 2020-11-24 DIAGNOSIS — M10072 Idiopathic gout, left ankle and foot: Secondary | ICD-10-CM

## 2020-11-24 DIAGNOSIS — N951 Menopausal and female climacteric states: Secondary | ICD-10-CM

## 2020-11-24 MED ORDER — ESTRADIOL CYPIONATE 5 MG/ML IM OIL
0.5000 mg | TOPICAL_OIL | Freq: Once | INTRAMUSCULAR | Status: AC
  Administered 2020-11-24: 0.5 mg via INTRAMUSCULAR

## 2020-11-24 MED ORDER — METHYLPREDNISOLONE 4 MG PO TBPK: ORAL_TABLET | ORAL | 1 refills | Status: DC

## 2020-11-24 MED ORDER — METHYLPREDNISOLONE ACETATE 80 MG/ML IJ SUSP
80.0000 mg | Freq: Once | INTRAMUSCULAR | Status: AC
  Administered 2020-11-24: 80 mg via INTRAMUSCULAR

## 2020-11-24 NOTE — Assessment & Plan Note (Signed)
Acute attack - L ankle: given Depo-Medrol 80 mg IM and a Medrol pack

## 2020-11-24 NOTE — Assessment & Plan Note (Signed)
Cont w/Doxepin at hs Hope she will be able to get a dog this weekend

## 2020-11-24 NOTE — Progress Notes (Signed)
Subjective:  Patient ID: [name], female    DOB: 11/09/1945  Age: 75 y.o. MRN: [id number]  CC: 4 week f/u (Gout flare up in her left foot. She is also here for her hormone shot. )   HPI Jacki Cones Tostenson presents for HTN, hot flashes, gout f/u. C/o stress with dogs: she has been through 4 unsuccessful attempts to get a dog... C/o L ankle acute gout    Outpatient Medications Prior to Visit  Medication Sig Dispense Refill   aspirin 81 MG EC tablet Take 81 mg by mouth daily.     Cholecalciferol 1000 UNITS tablet Take 1,000 Units by mouth daily.     clobetasol ointment (TEMOVATE) 0.05 % Apply 1 application topically 2 (two) times daily. Apply as directed twice daily for up to 2 weeks, then space out to 1-2 x a week. Not for daily long term use. 30 g 0   cloNIDine (CATAPRES) 0.1 MG tablet Take 1 tablet (0.1 mg total) by mouth 3 (three) times daily as needed (take for BP>180/110). 90 tablet 3   clorazepate (TRANXENE) 15 MG tablet Take 1 tablet (15 mg total) by mouth 3 (three) times daily as needed for anxiety (do not take with Lorazepam). for anxiety 270 tablet 1   doxepin (SINEQUAN) 50 MG capsule Take 3 capsules (150 mg total) by mouth at bedtime. 270 capsule 3   estradiol (ESTRACE) 0.1 MG/GM vaginal cream Place 1 Applicatorful vaginally at bedtime as needed. 42.5 g 0   estradiol cypionate (DEPO-ESTRADIOL) 5 MG/ML injection Use 0.3 ml every 18-25 days IM 5 mL 3   furosemide (LASIX) 40 MG tablet Take 1-2 tablets (40-80 mg total) by mouth daily. 180 tablet 3   levothyroxine (SYNTHROID) 137 MCG tablet TAKE 1 TABLET BY MOUTH ONCE DAILY BEFORE BREAKFAST every other day and alternate with one half every other day 90 tablet 3   LORazepam (ATIVAN) 0.5 MG tablet Take 1 tablet (0.5 mg total) by mouth at bedtime. 90 tablet 1   methylPREDNISolone (MEDROL DOSEPAK) 4 MG TBPK tablet USE AS DIRECTED FOR  A  GOUT  ATTACK 21 tablet 0   metoprolol tartrate (LOPRESSOR) 50 MG tablet Take 1 tablet (50 mg total) by  mouth 3 (three) times daily. 270 tablet 3   mupirocin ointment (BACTROBAN) 2 % On leg wound w/dressing change qd or bid 30 g 0   NIFEdipine (PROCARDIA-XL/NIFEDICAL-XL) 30 MG 24 hr tablet Take 1 tablet (30 mg total) by mouth daily. 90 tablet 3   nitroGLYCERIN (NITROLINGUAL) 0.4 MG/SPRAY spray PLACE 1 SPRAY UNDER THE TONGUE EVERY 5 MINUTES AS NEEDED FOR CHEST PAIN. MAY REPEAT 3 TIMES 12 g 1   potassium chloride SA (KLOR-CON M20) 20 MEQ tablet Take 1 tablet (20 mEq total) by mouth daily. 90 tablet 3   amoxicillin-clavulanate (AUGMENTIN) 875-125 MG tablet Take 1 tablet by mouth 2 (two) times daily. (Patient not taking: No sig reported) 14 tablet 1   No facility-administered medications prior to visit.    ROS: Review of Systems  Constitutional:  Negative for activity change, appetite change, chills, fatigue and unexpected weight change.  HENT:  Negative for congestion, mouth sores and sinus pressure.   Eyes:  Negative for visual disturbance.  Respiratory:  Negative for cough and chest tightness.   Gastrointestinal:  Negative for abdominal pain and nausea.  Genitourinary:  Negative for difficulty urinating, frequency and vaginal pain.  Musculoskeletal:  Positive for arthralgias. Negative for back pain and gait problem.  Skin:  Negative  for pallor and rash.  Neurological:  Negative for dizziness, tremors, weakness, numbness and headaches.  Psychiatric/Behavioral:  Negative for confusion, sleep disturbance and suicidal ideas. The patient is nervous/anxious.    Objective:  BP 134/80   Pulse 65   Temp 97.8 F (36.6 C) (Oral)   Resp 18   Ht 5\' 8"  (1.727 m)   Wt 139 lb (63 kg)   SpO2 97%   BMI 21.13 kg/m   BP Readings from Last 3 Encounters:  11/24/20 134/80  10/26/20 140/72  10/21/20 (!) 162/82    Wt Readings from Last 3 Encounters:  11/24/20 139 lb (63 kg)  10/26/20 141 lb (64 kg)  10/21/20 141 lb (64 kg)    Physical Exam Constitutional:      General: She is not in acute  distress.    Appearance: She is well-developed.  HENT:     Head: Normocephalic.     Right Ear: External ear normal.     Left Ear: External ear normal.     Nose: Nose normal.  Eyes:     General:        Right eye: No discharge.        Left eye: No discharge.     Conjunctiva/sclera: Conjunctivae normal.     Pupils: Pupils are equal, round, and reactive to light.  Neck:     Thyroid: No thyromegaly.     Vascular: No JVD.     Trachea: No tracheal deviation.  Cardiovascular:     Rate and Rhythm: Normal rate and regular rhythm.     Heart sounds: Normal heart sounds.  Pulmonary:     Effort: No respiratory distress.     Breath sounds: No stridor. No wheezing.  Abdominal:     General: Bowel sounds are normal. There is no distension.     Palpations: Abdomen is soft. There is no mass.     Tenderness: There is no abdominal tenderness. There is no guarding or rebound.  Musculoskeletal:        General: Tenderness present.     Cervical back: Normal range of motion and neck supple. No rigidity.  Lymphadenopathy:     Cervical: No cervical adenopathy.  Skin:    Findings: No erythema or rash.  Neurological:     Mental Status: She is oriented to person, place, and time.     Cranial Nerves: No cranial nerve deficit.     Motor: No weakness or abnormal muscle tone.     Coordination: Coordination normal.     Gait: Gait abnormal.     Deep Tendon Reflexes: Reflexes normal.  Psychiatric:        Behavior: Behavior normal.        Thought Content: Thought content normal.        Judgment: Judgment normal.  L ankle is swollen w/pain L shin 1 cm scab on wound  Lab Results  Component Value Date   WBC 5.6 12/29/2019   HGB 12.3 12/29/2019   HCT 37.0 12/29/2019   PLT 268 12/29/2019   GLUCOSE 112 (H) 09/13/2020   CHOL 235 (H) 12/29/2019   TRIG 100 12/29/2019   HDL 69 12/29/2019   LDLDIRECT 188.2 01/10/2012   LDLCALC 145 (H) 12/29/2019   ALT 11 12/29/2019   AST 14 12/29/2019   NA 140 09/13/2020    K 3.7 09/13/2020   CL 101 09/13/2020   CREATININE 1.26 (H) 09/13/2020   BUN 14 09/13/2020   CO2 31 09/13/2020   TSH <0.01 (L)  05/16/2020   INR 1.0 08/01/2018    CT RENAL STONE STUDY  Result Date: 08/01/2018 CLINICAL DATA:  Hematuria, loose stools EXAM: CT ABDOMEN AND PELVIS WITHOUT CONTRAST TECHNIQUE: Multidetector CT imaging of the abdomen and pelvis was performed following the standard protocol without IV contrast. COMPARISON:  12/24/2014 FINDINGS: Lower chest: No acute abnormality. Coronary artery calcifications. Hepatobiliary: No focal liver abnormality is seen. No gallstones, gallbladder wall thickening, or biliary dilatation. Pancreas: Unremarkable. No pancreatic ductal dilatation or surrounding inflammatory changes. Spleen: Normal in size without focal abnormality. Adrenals/Urinary Tract: Adrenal glands are unremarkable. Kidneys are normal, without renal calculi, focal lesion, or hydronephrosis. Multiple small calculi in the dependent urinary bladder as seen on prior examination. Stomach/Bowel: Stomach is within normal limits. Appendix appears normal. No evidence of bowel wall thickening, distention, or inflammatory changes. Vascular/Lymphatic: Calcific atherosclerosis. No enlarged abdominal or pelvic lymph nodes. Reproductive: No mass or other abnormality. Status post hysterectomy. Other: No abdominal wall hernia or abnormality. No abdominopelvic ascites. Musculoskeletal: No acute or significant osseous findings. IMPRESSION: 1. No acute noncontrast CT findings of the abdomen or pelvis to explain hematuria or loose stools. 2. Multiple small calculi within the dependent urinary bladder, similar to prior study. Electronically Signed   By: Lauralyn Primes M.D.   On: 08/01/2018 15:42    Assessment & Plan:    Sonda Primes, MD

## 2020-11-24 NOTE — Patient Instructions (Signed)
[facility]

## 2020-11-24 NOTE — Addendum Note (Signed)
Addended by: Deatra James on: 11/24/2020 02:26 PM   Modules accepted: Orders

## 2020-11-24 NOTE — Assessment & Plan Note (Signed)
Lorazepam prn at hs only, Doxepine at hs

## 2020-11-24 NOTE — Assessment & Plan Note (Signed)
Depo-Estradiol 0.3 ml

## 2020-12-27 ENCOUNTER — Other Ambulatory Visit: Payer: Self-pay

## 2020-12-27 ENCOUNTER — Encounter: Payer: Self-pay | Admitting: Internal Medicine

## 2020-12-27 ENCOUNTER — Ambulatory Visit (INDEPENDENT_AMBULATORY_CARE_PROVIDER_SITE_OTHER): Payer: Medicare Other | Admitting: Internal Medicine

## 2020-12-27 VITALS — BP 184/82 | HR 82 | Temp 98.1°F | Ht 68.0 in | Wt 139.4 lb

## 2020-12-27 DIAGNOSIS — I10 Essential (primary) hypertension: Secondary | ICD-10-CM | POA: Diagnosis not present

## 2020-12-27 DIAGNOSIS — M10072 Idiopathic gout, left ankle and foot: Secondary | ICD-10-CM

## 2020-12-27 DIAGNOSIS — I2510 Atherosclerotic heart disease of native coronary artery without angina pectoris: Secondary | ICD-10-CM

## 2020-12-27 DIAGNOSIS — N951 Menopausal and female climacteric states: Secondary | ICD-10-CM

## 2020-12-27 MED ORDER — ESTRADIOL CYPIONATE 5 MG/ML IM OIL
3.0000 mg | TOPICAL_OIL | Freq: Once | INTRAMUSCULAR | Status: AC
  Administered 2020-12-27: 3 mg via INTRAMUSCULAR

## 2020-12-27 NOTE — Progress Notes (Signed)
Subjective:  Patient ID: [name], female    DOB: [birth year]  Age: 75 y.o. MRN: [id number]  CC: Follow-up (6 week f/u)   HPI Avrianna C Bullen presents for HTN, CAD, menopausal sweats  Outpatient Medications Prior to Visit  Medication Sig Dispense Refill   aspirin 81 MG EC tablet Take 81 mg by mouth daily.     Cholecalciferol 1000 UNITS tablet Take 1,000 Units by mouth daily.     cloNIDine (CATAPRES) 0.1 MG tablet Take 1 tablet (0.1 mg total) by mouth 3 (three) times daily as needed (take for BP>180/110). 90 tablet 3   clorazepate (TRANXENE) 15 MG tablet Take 1 tablet (15 mg total) by mouth 3 (three) times daily as needed for anxiety (do not take with Lorazepam). for anxiety 270 tablet 1   doxepin (SINEQUAN) 50 MG capsule Take 3 capsules (150 mg total) by mouth at bedtime. 270 capsule 3   estradiol (ESTRACE) 0.1 MG/GM vaginal cream Place 1 Applicatorful vaginally at bedtime as needed. 42.5 g 0   estradiol cypionate (DEPO-ESTRADIOL) 5 MG/ML injection Use 0.3 ml every 18-25 days IM 5 mL 3   furosemide (LASIX) 40 MG tablet Take 1-2 tablets (40-80 mg total) by mouth daily. 180 tablet 3   levothyroxine (SYNTHROID) 137 MCG tablet TAKE 1 TABLET BY MOUTH ONCE DAILY BEFORE BREAKFAST every other day and alternate with one half every other day 90 tablet 3   LORazepam (ATIVAN) 0.5 MG tablet Take 1 tablet (0.5 mg total) by mouth at bedtime. 90 tablet 1   metoprolol tartrate (LOPRESSOR) 50 MG tablet Take 1 tablet (50 mg total) by mouth 3 (three) times daily. 270 tablet 3   mupirocin ointment (BACTROBAN) 2 % On leg wound w/dressing change qd or bid 30 g 0   NIFEdipine (PROCARDIA-XL/NIFEDICAL-XL) 30 MG 24 hr tablet Take 1 tablet (30 mg total) by mouth daily. 90 tablet 3   nitroGLYCERIN (NITROLINGUAL) 0.4 MG/SPRAY spray PLACE 1 SPRAY UNDER THE TONGUE EVERY 5 MINUTES AS NEEDED FOR CHEST PAIN. MAY REPEAT 3 TIMES 12 g 1   potassium chloride SA (KLOR-CON M20) 20 MEQ tablet Take 1 tablet (20 mEq total) by mouth  daily. 90 tablet 3   clobetasol ointment (TEMOVATE) 0.05 % Apply 1 application topically 2 (two) times daily. Apply as directed twice daily for up to 2 weeks, then space out to 1-2 x a week. Not for daily long term use. (Patient not taking: Reported on 12/27/2020) 30 g 0   methylPREDNISolone (MEDROL DOSEPAK) 4 MG TBPK tablet USE AS DIRECTED FOR  A  GOUT  ATTACK (Patient not taking: Reported on 12/27/2020) 21 tablet 1   No facility-administered medications prior to visit.    ROS: Review of Systems  Constitutional:  Negative for activity change, appetite change, chills, fatigue and unexpected weight change.  HENT:  Negative for congestion, mouth sores and sinus pressure.   Eyes:  Negative for visual disturbance.  Respiratory:  Negative for cough and chest tightness.   Gastrointestinal:  Negative for abdominal pain and nausea.  Genitourinary:  Negative for difficulty urinating, frequency and vaginal pain.  Musculoskeletal:  Positive for arthralgias. Negative for back pain and gait problem.  Skin:  Negative for pallor and rash.  Neurological:  Negative for dizziness, tremors, weakness, numbness and headaches.  Psychiatric/Behavioral:  Positive for dysphoric mood. Negative for confusion, sleep disturbance and suicidal ideas. The patient is nervous/anxious.    Objective:  BP (!) 184/82 (BP Location: Left Arm)   Pulse 82  Temp 98.1 F (36.7 C) (Oral)   Ht 5\' 8"  (1.727 m)   Wt 139 lb 6.4 oz (63.2 kg)   SpO2 96%   BMI 21.20 kg/m   BP Readings from Last 3 Encounters:  12/27/20 (!) 184/82  11/24/20 134/80  10/26/20 140/72    Wt Readings from Last 3 Encounters:  12/27/20 139 lb 6.4 oz (63.2 kg)  11/24/20 139 lb (63 kg)  10/26/20 141 lb (64 kg)    Physical Exam Constitutional:      General: She is not in acute distress.    Appearance: Normal appearance. She is well-developed.  HENT:     Head: Normocephalic.     Right Ear: External ear normal.     Left Ear: External ear normal.      Nose: Nose normal.  Eyes:     General:        Right eye: No discharge.        Left eye: No discharge.     Conjunctiva/sclera: Conjunctivae normal.     Pupils: Pupils are equal, round, and reactive to light.  Neck:     Thyroid: No thyromegaly.     Vascular: No JVD.     Trachea: No tracheal deviation.  Cardiovascular:     Rate and Rhythm: Normal rate and regular rhythm.     Heart sounds: Normal heart sounds.  Pulmonary:     Effort: No respiratory distress.     Breath sounds: No stridor. No wheezing.  Abdominal:     General: Bowel sounds are normal. There is no distension.     Palpations: Abdomen is soft. There is no mass.     Tenderness: There is no abdominal tenderness. There is no guarding or rebound.  Musculoskeletal:        General: No tenderness.     Cervical back: Normal range of motion and neck supple. No rigidity.  Lymphadenopathy:     Cervical: No cervical adenopathy.  Skin:    Findings: No erythema or rash.  Neurological:     Cranial Nerves: No cranial nerve deficit.     Motor: No abnormal muscle tone.     Coordination: Coordination normal.     Deep Tendon Reflexes: Reflexes normal.  Psychiatric:        Behavior: Behavior normal.        Thought Content: Thought content normal.        Judgment: Judgment normal.    Lab Results  Component Value Date   WBC 5.6 12/29/2019   HGB 12.3 12/29/2019   HCT 37.0 12/29/2019   PLT 268 12/29/2019   GLUCOSE 112 (H) 09/13/2020   CHOL 235 (H) 12/29/2019   TRIG 100 12/29/2019   HDL 69 12/29/2019   LDLDIRECT 188.2 01/10/2012   LDLCALC 145 (H) 12/29/2019   ALT 11 12/29/2019   AST 14 12/29/2019   NA 140 09/13/2020   K 3.7 09/13/2020   CL 101 09/13/2020   CREATININE 1.26 (H) 09/13/2020   BUN 14 09/13/2020   CO2 31 09/13/2020   TSH <0.01 (L) 05/16/2020   INR 1.0 08/01/2018    CT RENAL STONE STUDY  Result Date: 08/01/2018 CLINICAL DATA:  Hematuria, loose stools EXAM: CT ABDOMEN AND PELVIS WITHOUT CONTRAST TECHNIQUE:  Multidetector CT imaging of the abdomen and pelvis was performed following the standard protocol without IV contrast. COMPARISON:  12/24/2014 FINDINGS: Lower chest: No acute abnormality. Coronary artery calcifications. Hepatobiliary: No focal liver abnormality is seen. No gallstones, gallbladder wall thickening, or biliary dilatation.  Pancreas: Unremarkable. No pancreatic ductal dilatation or surrounding inflammatory changes. Spleen: Normal in size without focal abnormality. Adrenals/Urinary Tract: Adrenal glands are unremarkable. Kidneys are normal, without renal calculi, focal lesion, or hydronephrosis. Multiple small calculi in the dependent urinary bladder as seen on prior examination. Stomach/Bowel: Stomach is within normal limits. Appendix appears normal. No evidence of bowel wall thickening, distention, or inflammatory changes. Vascular/Lymphatic: Calcific atherosclerosis. No enlarged abdominal or pelvic lymph nodes. Reproductive: No mass or other abnormality. Status post hysterectomy. Other: No abdominal wall hernia or abnormality. No abdominopelvic ascites. Musculoskeletal: No acute or significant osseous findings. IMPRESSION: 1. No acute noncontrast CT findings of the abdomen or pelvis to explain hematuria or loose stools. 2. Multiple small calculi within the dependent urinary bladder, similar to prior study. Electronically Signed   By: Lauralyn Primes M.D.   On: 08/01/2018 15:42    Assessment & Plan:     Sonda Primes, MD

## 2020-12-27 NOTE — Assessment & Plan Note (Signed)
No relapse Medrol pack, Use Voltaren gel for gout, arthritis

## 2020-12-27 NOTE — Assessment & Plan Note (Signed)
  Stable. The best we can do. Continue w/Toprol, Nifedipine, Furosemide 80 mg q am BP ok at home Clonidine prn for SBP>190

## 2020-12-27 NOTE — Assessment & Plan Note (Signed)
Cont w/Toprol, Procardia No angina

## 2021-01-12 ENCOUNTER — Encounter: Payer: Self-pay | Admitting: Internal Medicine

## 2021-01-12 ENCOUNTER — Other Ambulatory Visit: Payer: Self-pay

## 2021-01-12 ENCOUNTER — Ambulatory Visit (INDEPENDENT_AMBULATORY_CARE_PROVIDER_SITE_OTHER): Payer: Medicare Other | Admitting: Internal Medicine

## 2021-01-12 VITALS — BP 148/82 | HR 67 | Temp 98.3°F | Ht 68.0 in | Wt 139.2 lb

## 2021-01-12 DIAGNOSIS — N3000 Acute cystitis without hematuria: Secondary | ICD-10-CM

## 2021-01-12 DIAGNOSIS — R300 Dysuria: Secondary | ICD-10-CM | POA: Diagnosis not present

## 2021-01-12 DIAGNOSIS — N390 Urinary tract infection, site not specified: Secondary | ICD-10-CM | POA: Insufficient documentation

## 2021-01-12 LAB — POC URINALSYSI DIPSTICK (AUTOMATED)
Bilirubin, UA: NEGATIVE
Blood, UA: NEGATIVE
Glucose, UA: NEGATIVE
Ketones, UA: NEGATIVE
Nitrite, UA: NEGATIVE
Protein, UA: NEGATIVE
Spec Grav, UA: 1.01 (ref 1.010–1.025)
Urobilinogen, UA: 0.2 E.U./dL
pH, UA: 6 (ref 5.0–8.0)

## 2021-01-12 MED ORDER — FLUCONAZOLE 150 MG PO TABS: 150.0000 mg | ORAL_TABLET | Freq: Once | ORAL | 1 refills | Status: AC

## 2021-01-12 MED ORDER — CEFUROXIME AXETIL 250 MG PO TABS
250.0000 mg | ORAL_TABLET | Freq: Two times a day (BID) | ORAL | 1 refills | Status: DC
Start: 2021-01-12 — End: 2021-02-02

## 2021-01-12 NOTE — Patient Instructions (Signed)
Urinary Tract Infection, Adult A urinary tract infection (UTI) is an infection of any part of the urinary tract. The urinary tract includes the kidneys, ureters, bladder, and urethra. These organs make, store, and get rid of urine in the body. An upper UTI affects the ureters and kidneys. A lower UTI affects the bladder and urethra. What are the causes? Most urinary tract infections are caused by bacteria in your genital area around your urethra, where urine leaves your body. These bacteria grow and cause inflammation of your urinary tract. What increases the risk? You are more likely to develop this condition if: You have a urinary catheter that stays in place. You are not able to control when you urinate or have a bowel movement (incontinence). You are female and you: Use a spermicide or diaphragm for birth control. Have low estrogen levels. Are pregnant. You have certain genes that increase your risk. You are sexually active. You take antibiotic medicines. You have a condition that causes your flow of urine to slow down, such as: An enlarged prostate, if you are female. Blockage in your urethra. A kidney stone. A nerve condition that affects your bladder control (neurogenic bladder). Not getting enough to drink, or not urinating often. You have certain medical conditions, such as: Diabetes. A weak disease-fighting system (immunesystem). Sickle cell disease. Gout. Spinal cord injury. What are the signs or symptoms? Symptoms of this condition include: Needing to urinate right away (urgency). Frequent urination. This may include small amounts of urine each time you urinate. Pain or burning with urination. Blood in the urine. Urine that smells bad or unusual. Trouble urinating. Cloudy urine. Vaginal discharge, if you are female. Pain in the abdomen or the lower back. You may also have: Vomiting or a decreased appetite. Confusion. Irritability or tiredness. A fever or  chills. Diarrhea. The first symptom in older adults may be confusion. In some cases, they may not have any symptoms until the infection has worsened. How is this diagnosed? This condition is diagnosed based on your medical history and a physical exam. You may also have other tests, including: Urine tests. Blood tests. Tests for STIs (sexually transmitted infections). If you have had more than one UTI, a cystoscopy or imaging studies may be done to determine the cause of the infections. How is this treated? Treatment for this condition includes: Antibiotic medicine. Over-the-counter medicines to treat discomfort. Drinking enough water to stay hydrated. If you have frequent infections or have other conditions such as a kidney stone, you may need to see a health care provider who specializes in the urinary tract (urologist). In rare cases, urinary tract infections can cause sepsis. Sepsis is a life-threatening condition that occurs when the body responds to an infection. Sepsis is treated in the hospital with IV antibiotics, fluids, and other medicines. Follow these instructions at home: Medicines Take over-the-counter and prescription medicines only as told by your health care provider. If you were prescribed an antibiotic medicine, take it as told by your health care provider. Do not stop using the antibiotic even if you start to feel better. General instructions Make sure you: Empty your bladder often and completely. Do not hold urine for long periods of time. Empty your bladder after sex. Wipe from front to back after urinating or having a bowel movement if you are female. Use each tissue only one time when you wipe. Drink enough fluid to keep your urine pale yellow. Keep all follow-up visits. This is important. Contact a health care provider   if: Your symptoms do not get better after 1-2 days. Your symptoms go away and then return. Get help right away if: You have severe pain in your  back or your lower abdomen. You have a fever or chills. You have nausea or vomiting. Summary A urinary tract infection (UTI) is an infection of any part of the urinary tract, which includes the kidneys, ureters, bladder, and urethra. Most urinary tract infections are caused by bacteria in your genital area. Treatment for this condition often includes antibiotic medicines. If you were prescribed an antibiotic medicine, take it as told by your health care provider. Do not stop using the antibiotic even if you start to feel better. Keep all follow-up visits. This is important. This information is not intended to replace advice given to you by your health care provider. Make sure you discuss any questions you have with your health care provider. Document Revised: 12/18/2019 Document Reviewed: 12/18/2019 Elsevier Patient Education  2022 [employer]  

## 2021-01-12 NOTE — Progress Notes (Signed)
Subjective:  Patient ID: [name], female    DOB: [birth year]  Age: 75 y.o. MRN: [id number]  CC: Urinary Tract Infection   HPI [name] presents for dysuria, frequency x 2-3 days  Outpatient Medications Prior to Visit  Medication Sig Dispense Refill   aspirin 81 MG EC tablet Take 81 mg by mouth daily.     Cholecalciferol 1000 UNITS tablet Take 1,000 Units by mouth daily.     cloNIDine (CATAPRES) 0.1 MG tablet Take 1 tablet (0.1 mg total) by mouth 3 (three) times daily as needed (take for BP>180/110). 90 tablet 3   clorazepate (TRANXENE) 15 MG tablet Take 1 tablet (15 mg total) by mouth 3 (three) times daily as needed for anxiety (do not take with Lorazepam). for anxiety 270 tablet 1   doxepin (SINEQUAN) 50 MG capsule Take 3 capsules (150 mg total) by mouth at bedtime. 270 capsule 3   estradiol (ESTRACE) 0.1 MG/GM vaginal cream Place 1 Applicatorful vaginally at bedtime as needed. 42.5 g 0   estradiol cypionate (DEPO-ESTRADIOL) 5 MG/ML injection Use 0.3 ml every 18-25 days IM 5 mL 3   furosemide (LASIX) 40 MG tablet Take 1-2 tablets (40-80 mg total) by mouth daily. 180 tablet 3   levothyroxine (SYNTHROID) 137 MCG tablet TAKE 1 TABLET BY MOUTH ONCE DAILY BEFORE BREAKFAST every other day and alternate with one half every other day 90 tablet 3   LORazepam (ATIVAN) 0.5 MG tablet Take 1 tablet (0.5 mg total) by mouth at bedtime. 90 tablet 1   metoprolol tartrate (LOPRESSOR) 50 MG tablet Take 1 tablet (50 mg total) by mouth 3 (three) times daily. 270 tablet 3   mupirocin ointment (BACTROBAN) 2 % On leg wound w/dressing change qd or bid 30 g 0   NIFEdipine (PROCARDIA-XL/NIFEDICAL-XL) 30 MG 24 hr tablet Take 1 tablet (30 mg total) by mouth daily. 90 tablet 3   nitroGLYCERIN (NITROLINGUAL) 0.4 MG/SPRAY spray PLACE 1 SPRAY UNDER THE TONGUE EVERY 5 MINUTES AS NEEDED FOR CHEST PAIN. MAY REPEAT 3 TIMES 12 g 1   potassium chloride SA (KLOR-CON M20) 20 MEQ tablet Take 1 tablet (20 mEq total) by  mouth daily. 90 tablet 3   No facility-administered medications prior to visit.    ROS: Review of Systems  Constitutional:  Negative for activity change, appetite change, chills, fatigue and unexpected weight change.  HENT:  Negative for congestion, mouth sores and sinus pressure.   Eyes:  Negative for visual disturbance.  Respiratory:  Negative for cough and chest tightness.   Gastrointestinal:  Negative for abdominal pain and nausea.  Genitourinary:  Positive for dysuria and frequency. Negative for difficulty urinating, hematuria and vaginal pain.  Musculoskeletal:  Negative for back pain and gait problem.  Skin:  Negative for pallor and rash.  Neurological:  Negative for dizziness, tremors, weakness, numbness and headaches.  Psychiatric/Behavioral:  Negative for confusion and sleep disturbance.    Objective:  BP (!) 148/82 (BP Location: Left Arm, Patient Position: Sitting, Cuff Size: Normal)   Pulse 67   Temp 98.3 F (36.8 C) (Oral)   Ht 5\' 8"  (1.727 m)   Wt 139 lb 3.2 oz (63.1 kg)   SpO2 97%   BMI 21.17 kg/m   BP Readings from Last 3 Encounters:  01/12/21 (!) 148/82  12/27/20 (!) 184/82  11/24/20 134/80    Wt Readings from Last 3 Encounters:  01/12/21 139 lb 3.2 oz (63.1 kg)  12/27/20 139 lb 6.4 oz (63.2 kg)  11/24/20  139 lb (63 kg)    Physical Exam Constitutional:      General: She is not in acute distress.    Appearance: She is well-developed.  HENT:     Head: Normocephalic.     Right Ear: External ear normal.     Left Ear: External ear normal.     Nose: Nose normal.  Eyes:     General:        Right eye: No discharge.        Left eye: No discharge.     Conjunctiva/sclera: Conjunctivae normal.     Pupils: Pupils are equal, round, and reactive to light.  Neck:     Thyroid: No thyromegaly.     Vascular: No JVD.     Trachea: No tracheal deviation.  Cardiovascular:     Rate and Rhythm: Normal rate and regular rhythm.     Heart sounds: Normal heart  sounds.  Pulmonary:     Effort: No respiratory distress.     Breath sounds: No stridor. No wheezing.  Abdominal:     General: Bowel sounds are normal. There is no distension.     Palpations: Abdomen is soft. There is no mass.     Tenderness: There is no abdominal tenderness. There is no guarding or rebound.  Musculoskeletal:        General: No tenderness.     Cervical back: Normal range of motion and neck supple.  Lymphadenopathy:     Cervical: No cervical adenopathy.  Skin:    Findings: No erythema or rash.  Neurological:     Mental Status: She is oriented to person, place, and time.     Cranial Nerves: No cranial nerve deficit.     Motor: No abnormal muscle tone.     Coordination: Coordination normal.     Deep Tendon Reflexes: Reflexes normal.  Psychiatric:        Behavior: Behavior normal.        Thought Content: Thought content normal.        Judgment: Judgment normal.  Trace edema on B feet  Lab Results  Component Value Date   WBC 5.6 12/29/2019   HGB 12.3 12/29/2019   HCT 37.0 12/29/2019   PLT 268 12/29/2019   GLUCOSE 112 (H) 09/13/2020   CHOL 235 (H) 12/29/2019   TRIG 100 12/29/2019   HDL 69 12/29/2019   LDLDIRECT 188.2 01/10/2012   LDLCALC 145 (H) 12/29/2019   ALT 11 12/29/2019   AST 14 12/29/2019   NA 140 09/13/2020   K 3.7 09/13/2020   CL 101 09/13/2020   CREATININE 1.26 (H) 09/13/2020   BUN 14 09/13/2020   CO2 31 09/13/2020   TSH <0.01 (L) 05/16/2020   INR 1.0 08/01/2018    CT RENAL STONE STUDY  Result Date: 08/01/2018 CLINICAL DATA:  Hematuria, loose stools EXAM: CT ABDOMEN AND PELVIS WITHOUT CONTRAST TECHNIQUE: Multidetector CT imaging of the abdomen and pelvis was performed following the standard protocol without IV contrast. COMPARISON:  12/24/2014 FINDINGS: Lower chest: No acute abnormality. Coronary artery calcifications. Hepatobiliary: No focal liver abnormality is seen. No gallstones, gallbladder wall thickening, or biliary dilatation. Pancreas:  Unremarkable. No pancreatic ductal dilatation or surrounding inflammatory changes. Spleen: Normal in size without focal abnormality. Adrenals/Urinary Tract: Adrenal glands are unremarkable. Kidneys are normal, without renal calculi, focal lesion, or hydronephrosis. Multiple small calculi in the dependent urinary bladder as seen on prior examination. Stomach/Bowel: Stomach is within normal limits. Appendix appears normal. No evidence of bowel wall  thickening, distention, or inflammatory changes. Vascular/Lymphatic: Calcific atherosclerosis. No enlarged abdominal or pelvic lymph nodes. Reproductive: No mass or other abnormality. Status post hysterectomy. Other: No abdominal wall hernia or abnormality. No abdominopelvic ascites. Musculoskeletal: No acute or significant osseous findings. IMPRESSION: 1. No acute noncontrast CT findings of the abdomen or pelvis to explain hematuria or loose stools. 2. Multiple small calculi within the dependent urinary bladder, similar to prior study. Electronically Signed   By: Lauralyn Primes M.D.   On: 08/01/2018 15:42    Assessment & Plan:     Sonda Primes, MD

## 2021-01-12 NOTE — Assessment & Plan Note (Signed)
Ceftin po Diflucan prn

## 2021-01-16 LAB — URINE CULTURE

## 2021-01-30 IMAGING — CT CT RENAL STONE PROTOCOL
2 of 4 series · 16 of 46 positions shown, 18 images · non-contrast
Comparison: 12/24/2014

CLINICAL DATA: Hematuria, loose stools

EXAM:
CT ABDOMEN AND PELVIS WITHOUT CONTRAST
TECHNIQUE: Multidetector CT imaging of the abdomen and pelvis was performed
following the standard protocol without IV contrast.

[Series 2: stone study 5.0 i30f 1 · axial · 0.71mm/px · z∈[+893,+1293]mm · 13 of 88 slices shown, 15 images]
[im 4/88  soft-tissue]
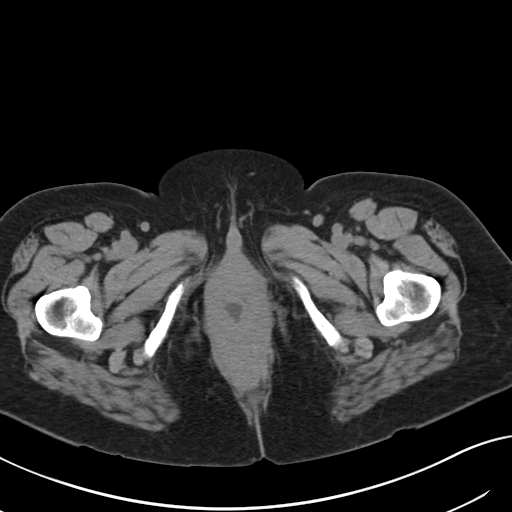
[im 4/88  bone]
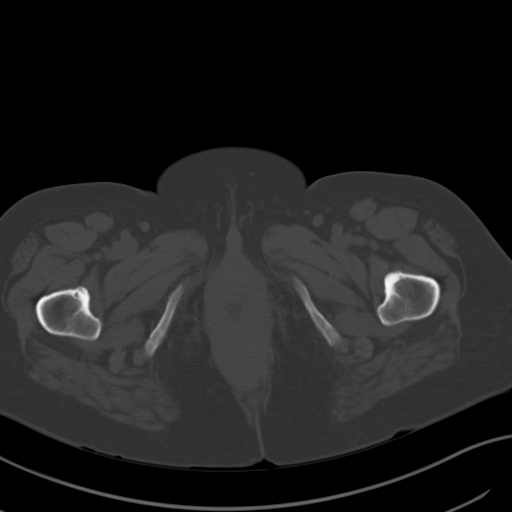
[im 11/88  soft-tissue]
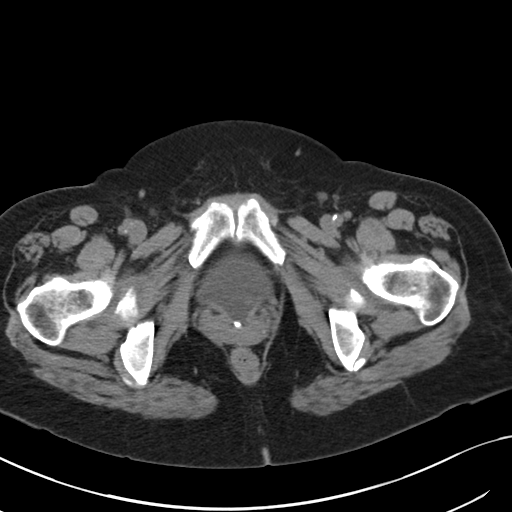
[im 19/88  soft-tissue]
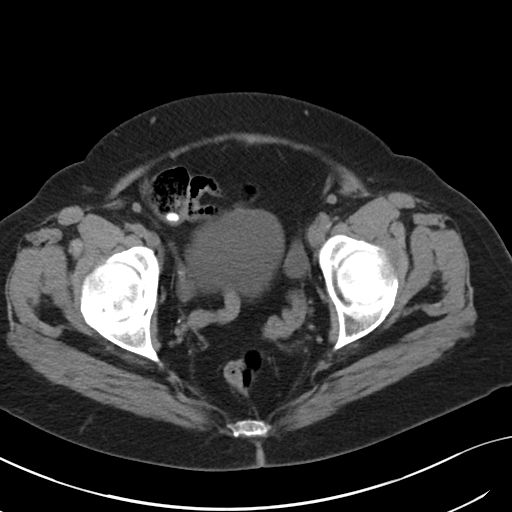
[im 26/88  soft-tissue]
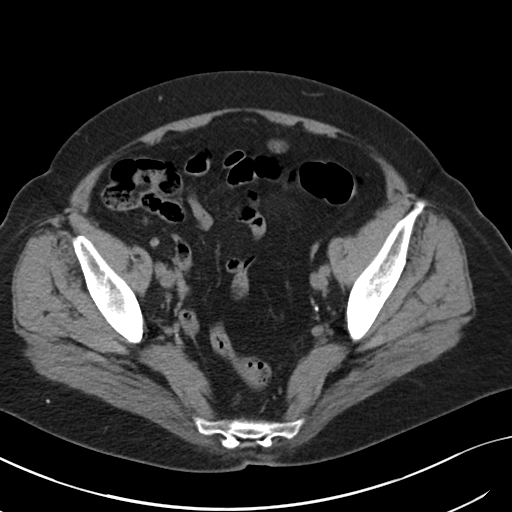
[im 30/88  soft-tissue]
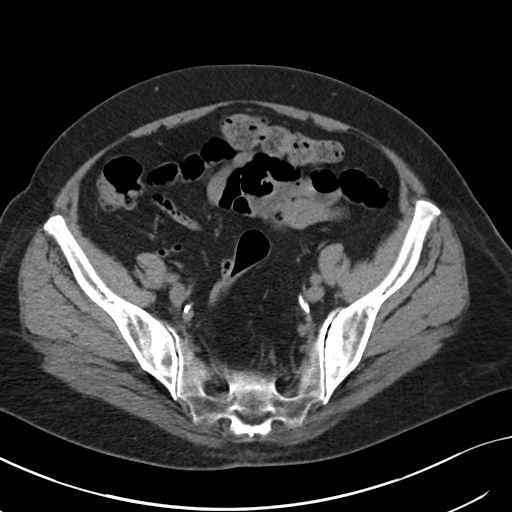
[im 37/88  soft-tissue]
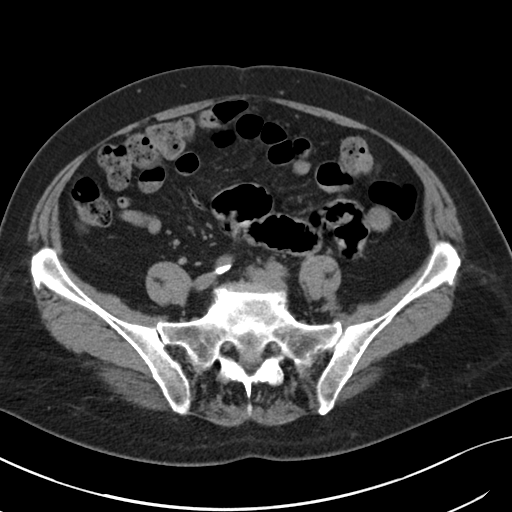
[im 44/88  soft-tissue]
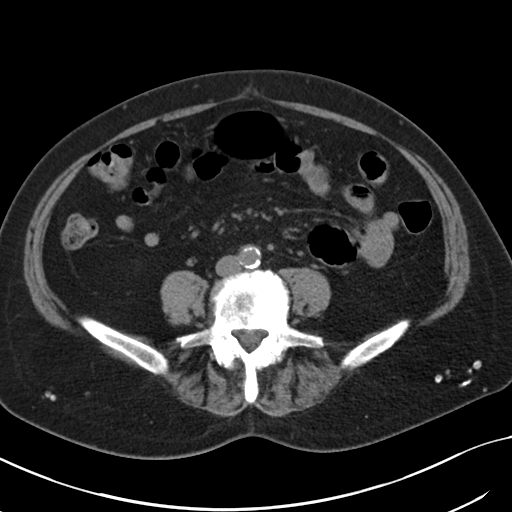
[im 51/88  soft-tissue]
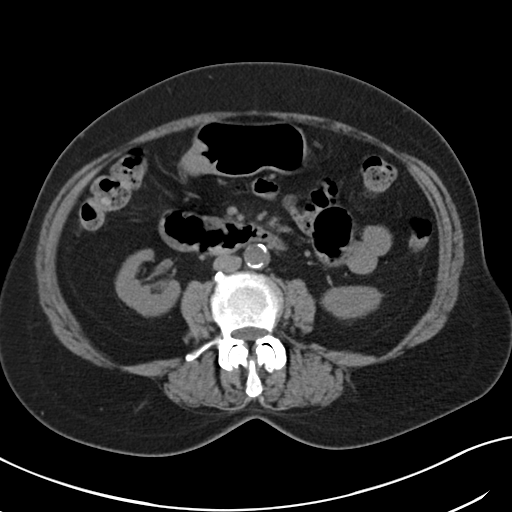
[im 59/88  soft-tissue]
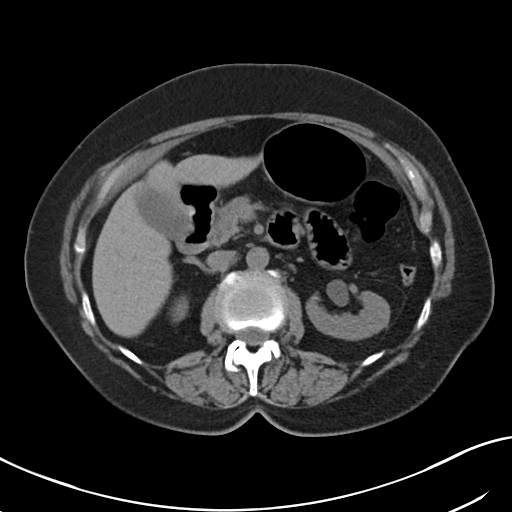
[im 59/88  bone]
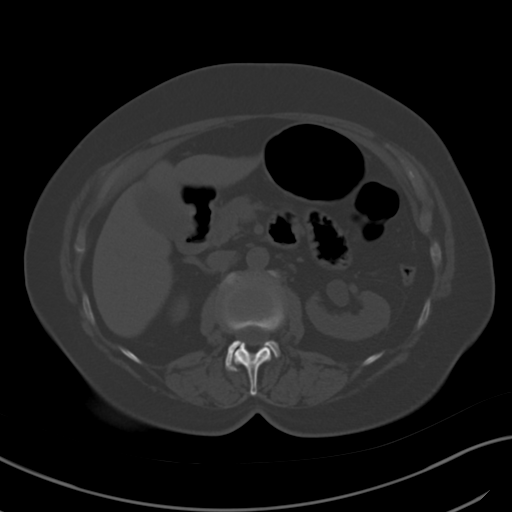
[im 62/88  soft-tissue]
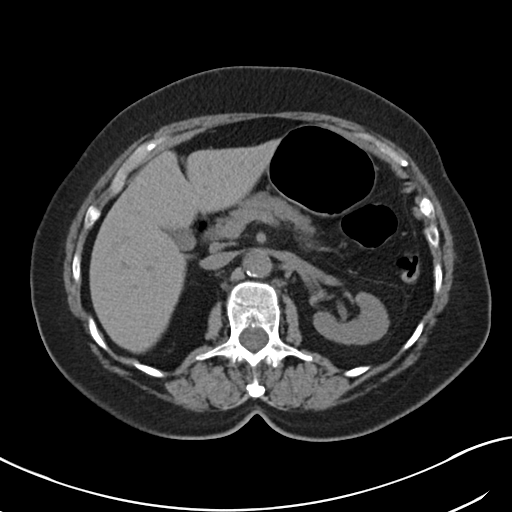
[im 69/88  soft-tissue]
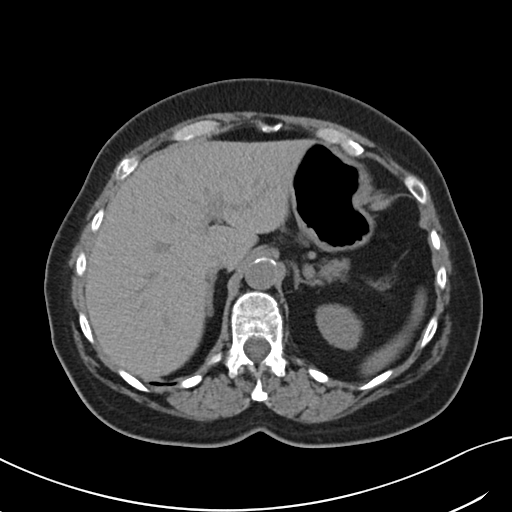
[im 77/88  soft-tissue]
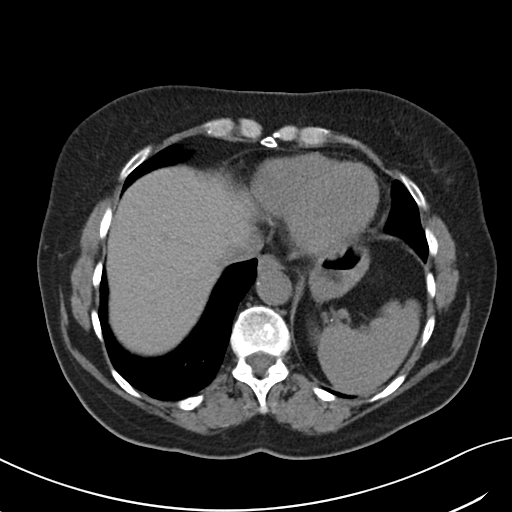
[im 84/88  soft-tissue]
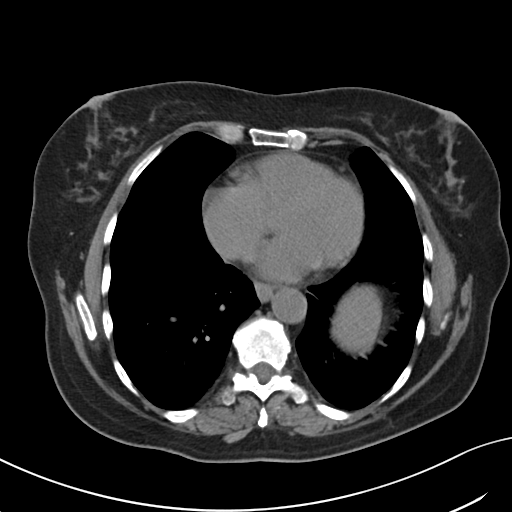

[Series 5: coronal soft tissue · coronal · 0.69mm/px · 3 of 91 slices shown]
[im 31/91  soft-tissue]
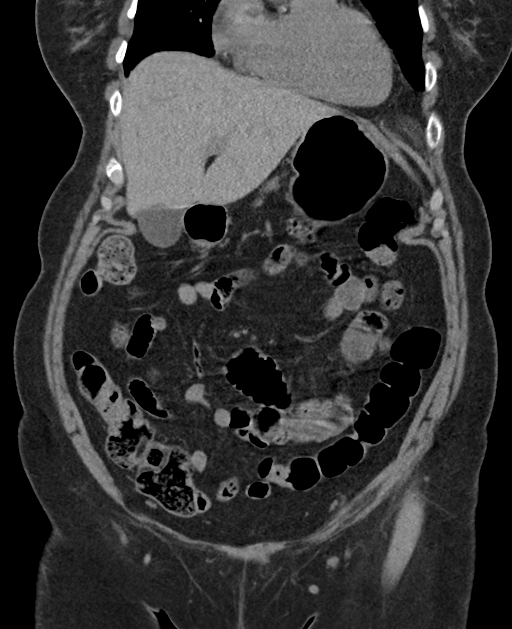
[im 41/91  soft-tissue]
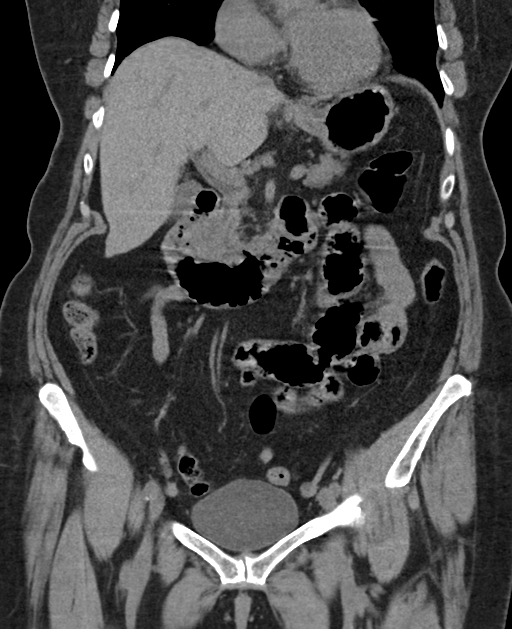
[im 51/91  soft-tissue]
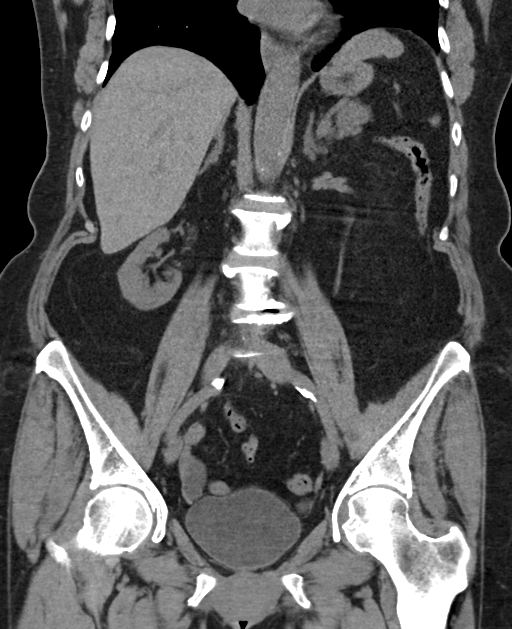

[16 of 46 positions shown; findings below may reference images not displayed]

FINDINGS: Lower chest: No acute abnormality. Coronary artery calcifications.

Hepatobiliary: No focal liver abnormality is seen. No gallstones,
gallbladder wall thickening, or biliary dilatation.

Pancreas: Unremarkable. No pancreatic ductal dilatation or
surrounding inflammatory changes.

Spleen: Normal in size without focal abnormality.

Adrenals/Urinary Tract: Adrenal glands are unremarkable. Kidneys are
normal, without renal calculi, focal lesion, or hydronephrosis.
Multiple small calculi in the dependent urinary bladder as seen on
prior examination.

Stomach/Bowel: Stomach is within normal limits. Appendix appears
normal. No evidence of bowel wall thickening, distention, or
inflammatory changes.

Vascular/Lymphatic: Calcific atherosclerosis. No enlarged abdominal
or pelvic lymph nodes.

Reproductive: No mass or other abnormality. Status post
hysterectomy.

Other: No abdominal wall hernia or abnormality. No abdominopelvic
ascites.

Musculoskeletal: No acute or significant osseous findings.
IMPRESSION: 1. No acute noncontrast CT findings of the abdomen or pelvis to
explain hematuria or loose stools.
2. Multiple small calculi within the dependent urinary bladder,
similar to prior study.

## 2021-02-02 ENCOUNTER — Ambulatory Visit (INDEPENDENT_AMBULATORY_CARE_PROVIDER_SITE_OTHER): Payer: Medicare Other | Admitting: Internal Medicine

## 2021-02-02 ENCOUNTER — Other Ambulatory Visit: Payer: Self-pay

## 2021-02-02 ENCOUNTER — Encounter: Payer: Self-pay | Admitting: Internal Medicine

## 2021-02-02 VITALS — BP 162/80 | HR 75 | Temp 98.3°F | Ht 68.0 in | Wt 138.2 lb

## 2021-02-02 DIAGNOSIS — F329 Major depressive disorder, single episode, unspecified: Secondary | ICD-10-CM | POA: Diagnosis not present

## 2021-02-02 DIAGNOSIS — M10072 Idiopathic gout, left ankle and foot: Secondary | ICD-10-CM

## 2021-02-02 DIAGNOSIS — F419 Anxiety disorder, unspecified: Secondary | ICD-10-CM | POA: Diagnosis not present

## 2021-02-02 DIAGNOSIS — E034 Atrophy of thyroid (acquired): Secondary | ICD-10-CM

## 2021-02-02 DIAGNOSIS — Z79890 Hormone replacement therapy (postmenopausal): Secondary | ICD-10-CM | POA: Diagnosis not present

## 2021-02-02 MED ORDER — ESTRADIOL CYPIONATE 5 MG/ML IM OIL
3.0000 mg | TOPICAL_OIL | Freq: Once | INTRAMUSCULAR | Status: AC
  Administered 2021-02-02: 3 mg via INTRAMUSCULAR

## 2021-02-02 NOTE — Assessment & Plan Note (Signed)
New dog Simonne Come (got him in 9/22) - very happy! On Doxepine

## 2021-02-02 NOTE — Assessment & Plan Note (Signed)
Cont w/ Levothroid 

## 2021-02-02 NOTE — Assessment & Plan Note (Signed)
On chronic HRT estradiol injections for severe hot flashes Continue w/depo-Estradiol 0.3 ml  Potential benefits of a long term HRT  use as well as potential risks  and complications were explained to the patient and were aknowledged.

## 2021-02-02 NOTE — Progress Notes (Signed)
Subjective:  Patient ID: [name], female    DOB: Oct 18, 1945  Age: 75 y.o. MRN: [id number]  CC: Follow-up (4 week f/u)   HPI [name] presents for diaphoresis, gout and anxiety New dog Simonne Come (got him in 9/22) - very happy!  Outpatient Medications Prior to Visit  Medication Sig Dispense Refill   aspirin 81 MG EC tablet Take 81 mg by mouth daily.     Cholecalciferol 1000 UNITS tablet Take 1,000 Units by mouth daily.     cloNIDine (CATAPRES) 0.1 MG tablet Take 1 tablet (0.1 mg total) by mouth 3 (three) times daily as needed (take for BP>180/110). 90 tablet 3   clorazepate (TRANXENE) 15 MG tablet Take 1 tablet (15 mg total) by mouth 3 (three) times daily as needed for anxiety (do not take with Lorazepam). for anxiety 270 tablet 1   doxepin (SINEQUAN) 50 MG capsule Take 3 capsules (150 mg total) by mouth at bedtime. 270 capsule 3   estradiol (ESTRACE) 0.1 MG/GM vaginal cream Place 1 Applicatorful vaginally at bedtime as needed. 42.5 g 0   estradiol cypionate (DEPO-ESTRADIOL) 5 MG/ML injection Use 0.3 ml every 18-25 days IM 5 mL 3   furosemide (LASIX) 40 MG tablet Take 1-2 tablets (40-80 mg total) by mouth daily. 180 tablet 3   levothyroxine (SYNTHROID) 137 MCG tablet TAKE 1 TABLET BY MOUTH ONCE DAILY BEFORE BREAKFAST every other day and alternate with one half every other day 90 tablet 3   LORazepam (ATIVAN) 0.5 MG tablet Take 1 tablet (0.5 mg total) by mouth at bedtime. 90 tablet 1   metoprolol tartrate (LOPRESSOR) 50 MG tablet Take 1 tablet (50 mg total) by mouth 3 (three) times daily. 270 tablet 3   mupirocin ointment (BACTROBAN) 2 % On leg wound w/dressing change qd or bid 30 g 0   NIFEdipine (PROCARDIA-XL/NIFEDICAL-XL) 30 MG 24 hr tablet Take 1 tablet (30 mg total) by mouth daily. 90 tablet 3   nitroGLYCERIN (NITROLINGUAL) 0.4 MG/SPRAY spray PLACE 1 SPRAY UNDER THE TONGUE EVERY 5 MINUTES AS NEEDED FOR CHEST PAIN. MAY REPEAT 3 TIMES 12 g 1   potassium chloride SA (KLOR-CON M20) 20 MEQ  tablet Take 1 tablet (20 mEq total) by mouth daily. 90 tablet 3   cefUROXime (CEFTIN) 250 MG tablet Take 1 tablet (250 mg total) by mouth 2 (two) times daily with a meal. (Patient not taking: Reported on 02/02/2021) 6 tablet 1   No facility-administered medications prior to visit.    ROS: Review of Systems  Constitutional:  Negative for activity change, appetite change, chills, fatigue and unexpected weight change.  HENT:  Negative for congestion, mouth sores and sinus pressure.   Eyes:  Negative for visual disturbance.  Respiratory:  Negative for cough and chest tightness.   Gastrointestinal:  Negative for abdominal pain and nausea.  Genitourinary:  Negative for difficulty urinating, frequency and vaginal pain.  Musculoskeletal:  Positive for arthralgias. Negative for back pain and gait problem.  Skin:  Negative for pallor and rash.  Neurological:  Negative for dizziness, tremors, weakness, numbness and headaches.  Psychiatric/Behavioral:  Negative for confusion, sleep disturbance and suicidal ideas. The patient is nervous/anxious.    Objective:  BP (!) 162/80 (BP Location: Left Arm)   Pulse 75   Temp 98.3 F (36.8 C) (Oral)   Ht 5\' 8"  (1.727 m)   Wt 138 lb 3.2 oz (62.7 kg)   SpO2 97%   BMI 21.01 kg/m   BP Readings from Last 3  Encounters:  02/02/21 (!) 162/80  01/12/21 (!) 148/82  12/27/20 (!) 184/82    Wt Readings from Last 3 Encounters:  02/02/21 138 lb 3.2 oz (62.7 kg)  01/12/21 139 lb 3.2 oz (63.1 kg)  12/27/20 139 lb 6.4 oz (63.2 kg)    Physical Exam Constitutional:      General: She is not in acute distress.    Appearance: She is well-developed.  HENT:     Head: Normocephalic.     Right Ear: External ear normal.     Left Ear: External ear normal.     Nose: Nose normal.  Eyes:     General:        Right eye: No discharge.        Left eye: No discharge.     Conjunctiva/sclera: Conjunctivae normal.     Pupils: Pupils are equal, round, and reactive to light.   Neck:     Thyroid: No thyromegaly.     Vascular: No JVD.     Trachea: No tracheal deviation.  Cardiovascular:     Rate and Rhythm: Normal rate and regular rhythm.     Heart sounds: Normal heart sounds.  Pulmonary:     Effort: No respiratory distress.     Breath sounds: No stridor. No wheezing.  Abdominal:     General: Bowel sounds are normal. There is no distension.     Palpations: Abdomen is soft. There is no mass.     Tenderness: There is no abdominal tenderness. There is no guarding or rebound.  Musculoskeletal:        General: No tenderness.     Cervical back: Normal range of motion and neck supple.  Lymphadenopathy:     Cervical: No cervical adenopathy.  Skin:    Findings: No erythema or rash.  Neurological:     Mental Status: She is oriented to person, place, and time.     Cranial Nerves: No cranial nerve deficit.     Motor: No abnormal muscle tone.     Coordination: Coordination normal.     Deep Tendon Reflexes: Reflexes normal.  Psychiatric:        Behavior: Behavior normal.        Thought Content: Thought content normal.        Judgment: Judgment normal.    Lab Results  Component Value Date   WBC 5.6 12/29/2019   HGB 12.3 12/29/2019   HCT 37.0 12/29/2019   PLT 268 12/29/2019   GLUCOSE 112 (H) 09/13/2020   CHOL 235 (H) 12/29/2019   TRIG 100 12/29/2019   HDL 69 12/29/2019   LDLDIRECT 188.2 01/10/2012   LDLCALC 145 (H) 12/29/2019   ALT 11 12/29/2019   AST 14 12/29/2019   NA 140 09/13/2020   K 3.7 09/13/2020   CL 101 09/13/2020   CREATININE 1.26 (H) 09/13/2020   BUN 14 09/13/2020   CO2 31 09/13/2020   TSH <0.01 (L) 05/16/2020   INR 1.0 08/01/2018    CT RENAL STONE STUDY  Result Date: 08/01/2018 CLINICAL DATA:  Hematuria, loose stools EXAM: CT ABDOMEN AND PELVIS WITHOUT CONTRAST TECHNIQUE: Multidetector CT imaging of the abdomen and pelvis was performed following the standard protocol without IV contrast. COMPARISON:  12/24/2014 FINDINGS: Lower chest:  No acute abnormality. Coronary artery calcifications. Hepatobiliary: No focal liver abnormality is seen. No gallstones, gallbladder wall thickening, or biliary dilatation. Pancreas: Unremarkable. No pancreatic ductal dilatation or surrounding inflammatory changes. Spleen: Normal in size without focal abnormality. Adrenals/Urinary Tract: Adrenal glands are  unremarkable. Kidneys are normal, without renal calculi, focal lesion, or hydronephrosis. Multiple small calculi in the dependent urinary bladder as seen on prior examination. Stomach/Bowel: Stomach is within normal limits. Appendix appears normal. No evidence of bowel wall thickening, distention, or inflammatory changes. Vascular/Lymphatic: Calcific atherosclerosis. No enlarged abdominal or pelvic lymph nodes. Reproductive: No mass or other abnormality. Status post hysterectomy. Other: No abdominal wall hernia or abnormality. No abdominopelvic ascites. Musculoskeletal: No acute or significant osseous findings. IMPRESSION: 1. No acute noncontrast CT findings of the abdomen or pelvis to explain hematuria or loose stools. 2. Multiple small calculi within the dependent urinary bladder, similar to prior study. Electronically Signed   By: Lauralyn Primes M.D.   On: 08/01/2018 15:42    Assessment & Plan:   Problem List Items Addressed This Visit     Anxiety disorder    New dog Simonne Come (got him in 9/22) - very happy!      Depression    New dog Simonne Come (got him in 9/22) - very happy! On Doxepine      Gout    No relapse Treating prn       Relevant Orders   Comprehensive metabolic panel   T4, free   TSH   CBC with Differential/Platelet   Uric acid   Hormone replacement therapy (HRT) - Primary    On chronic HRT estradiol injections for severe hot flashes Continue w/depo-Estradiol 0.3 ml  Potential benefits of a long term HRT  use as well as potential risks  and complications were explained to the patient and were aknowledged.      Hypothyroidism     Cont w/Levothroid      Relevant Orders   Comprehensive metabolic panel   T4, free   TSH   CBC with Differential/Platelet   Uric acid      Follow-up: Return in about 4 weeks (around 03/02/2021) for a follow-up visit.  Sonda Primes, MD

## 2021-02-02 NOTE — Assessment & Plan Note (Signed)
New dog Simonne Come (got him in 9/22) - very happy!

## 2021-02-02 NOTE — Assessment & Plan Note (Signed)
No relapse Treating prn

## 2021-02-28 ENCOUNTER — Other Ambulatory Visit: Payer: Self-pay | Admitting: Internal Medicine

## 2021-03-02 ENCOUNTER — Other Ambulatory Visit (INDEPENDENT_AMBULATORY_CARE_PROVIDER_SITE_OTHER): Payer: Medicare Other

## 2021-03-02 ENCOUNTER — Encounter: Payer: Self-pay | Admitting: Internal Medicine

## 2021-03-02 ENCOUNTER — Other Ambulatory Visit: Payer: Self-pay | Admitting: Internal Medicine

## 2021-03-02 ENCOUNTER — Other Ambulatory Visit: Payer: Self-pay

## 2021-03-02 ENCOUNTER — Ambulatory Visit (INDEPENDENT_AMBULATORY_CARE_PROVIDER_SITE_OTHER): Payer: Medicare Other | Admitting: Internal Medicine

## 2021-03-02 VITALS — BP 162/88 | HR 57 | Temp 98.0°F | Ht 68.0 in | Wt 139.8 lb

## 2021-03-02 DIAGNOSIS — Z79890 Hormone replacement therapy (postmenopausal): Secondary | ICD-10-CM | POA: Diagnosis not present

## 2021-03-02 DIAGNOSIS — F419 Anxiety disorder, unspecified: Secondary | ICD-10-CM | POA: Diagnosis not present

## 2021-03-02 DIAGNOSIS — E034 Atrophy of thyroid (acquired): Secondary | ICD-10-CM

## 2021-03-02 DIAGNOSIS — M10072 Idiopathic gout, left ankle and foot: Secondary | ICD-10-CM | POA: Diagnosis not present

## 2021-03-02 DIAGNOSIS — F329 Major depressive disorder, single episode, unspecified: Secondary | ICD-10-CM | POA: Diagnosis not present

## 2021-03-02 LAB — CBC WITH DIFFERENTIAL/PLATELET
Basophils Absolute: 0 10*3/uL (ref 0.0–0.1)
Basophils Relative: 0.6 % (ref 0.0–3.0)
Eosinophils Absolute: 0.2 10*3/uL (ref 0.0–0.7)
Eosinophils Relative: 1.8 % (ref 0.0–5.0)
HCT: 34.7 % — ABNORMAL LOW (ref 36.0–46.0)
Hemoglobin: 11.5 g/dL — ABNORMAL LOW (ref 12.0–15.0)
Lymphocytes Relative: 18.7 % (ref 12.0–46.0)
Lymphs Abs: 1.6 10*3/uL (ref 0.7–4.0)
MCHC: 33.1 g/dL (ref 30.0–36.0)
MCV: 90.7 fl (ref 78.0–100.0)
Monocytes Absolute: 0.6 10*3/uL (ref 0.1–1.0)
Monocytes Relative: 7.1 % (ref 3.0–12.0)
Neutro Abs: 6 10*3/uL (ref 1.4–7.7)
Neutrophils Relative %: 71.8 % (ref 43.0–77.0)
Platelets: 282 10*3/uL (ref 150.0–400.0)
RBC: 3.82 Mil/uL — ABNORMAL LOW (ref 3.87–5.11)
RDW: 12.8 % (ref 11.5–15.5)
WBC: 8.3 10*3/uL (ref 4.0–10.5)

## 2021-03-02 LAB — COMPREHENSIVE METABOLIC PANEL
ALT: 12 U/L (ref 0–35)
AST: 22 U/L (ref 0–37)
Albumin: 3.8 g/dL (ref 3.5–5.2)
Alkaline Phosphatase: 90 U/L (ref 39–117)
BUN: 15 mg/dL (ref 6–23)
CO2: 31 mEq/L (ref 19–32)
Calcium: 9 mg/dL (ref 8.4–10.5)
Chloride: 103 mEq/L (ref 96–112)
Creatinine, Ser: 1.18 mg/dL (ref 0.40–1.20)
GFR: 44.54 mL/min — ABNORMAL LOW (ref >60.00)
Glucose, Bld: 91 mg/dL (ref 70–99)
Potassium: 3.4 mEq/L — ABNORMAL LOW (ref 3.5–5.1)
Sodium: 140 mEq/L (ref 135–145)
Total Bilirubin: 0.4 mg/dL (ref 0.2–1.2)
Total Protein: 6.7 g/dL (ref 6.0–8.3)

## 2021-03-02 LAB — URIC ACID: Uric Acid, Serum: 7.8 mg/dL — ABNORMAL HIGH (ref 2.4–7.0)

## 2021-03-02 LAB — TSH: TSH: <0.01 u[IU]/mL — ABNORMAL LOW (ref 0.35–5.50)

## 2021-03-02 LAB — T4, FREE: Free T4: 1.36 ng/dL (ref 0.60–1.60)

## 2021-03-02 MED ORDER — ESTRADIOL CYPIONATE 5 MG/ML IM OIL
3.0000 mg | TOPICAL_OIL | Freq: Once | INTRAMUSCULAR | Status: AC
  Administered 2021-03-02: 3 mg via INTRAMUSCULAR

## 2021-03-02 NOTE — Assessment & Plan Note (Signed)
Cont w/ Levothroid 

## 2021-03-02 NOTE — Assessment & Plan Note (Signed)
Lorazepam prn at hs only, Doxepine at hs 

## 2021-03-02 NOTE — Progress Notes (Signed)
Subjective:  Patient ID: [name], female    DOB: Feb 25, 1946  Age: 75 y.o. MRN: [id number]  CC: Follow-up (4 week f/u)   HPI [name] presents for hot flashes, anxiety and stress, HTN Labs are drawn today. Very happy w/her new Immunologist   Outpatient Medications Prior to Visit  Medication Sig Dispense Refill   aspirin 81 MG EC tablet Take 81 mg by mouth daily.     Cholecalciferol 1000 UNITS tablet Take 1,000 Units by mouth daily.     cloNIDine (CATAPRES) 0.1 MG tablet Take 1 tablet (0.1 mg total) by mouth 3 (three) times daily as needed (take for BP>180/110). 90 tablet 3   clorazepate (TRANXENE) 15 MG tablet Take 1 tablet (15 mg total) by mouth 3 (three) times daily as needed for anxiety (do not take with Lorazepam). for anxiety 270 tablet 1   doxepin (SINEQUAN) 50 MG capsule Take 3 capsules (150 mg total) by mouth at bedtime. 270 capsule 3   estradiol (ESTRACE) 0.1 MG/GM vaginal cream Place 1 Applicatorful vaginally at bedtime as needed. 42.5 g 0   estradiol cypionate (DEPO-ESTRADIOL) 5 MG/ML injection Use 0.3 ml every 18-25 days IM 5 mL 3   furosemide (LASIX) 40 MG tablet Take 1-2 tablets (40-80 mg total) by mouth daily. 180 tablet 3   levothyroxine (SYNTHROID) 137 MCG tablet TAKE 1 TABLET BY MOUTH ONCE DAILY BEFORE BREAKFAST every other day and alternate with one half every other day 90 tablet 3   LORazepam (ATIVAN) 0.5 MG tablet TAKE 1 TABLET BY MOUTH AT BEDTIME 90 tablet 1   metoprolol tartrate (LOPRESSOR) 50 MG tablet Take 1 tablet (50 mg total) by mouth 3 (three) times daily. 270 tablet 3   NIFEdipine (PROCARDIA-XL/NIFEDICAL-XL) 30 MG 24 hr tablet Take 1 tablet (30 mg total) by mouth daily. 90 tablet 3   nitroGLYCERIN (NITROLINGUAL) 0.4 MG/SPRAY spray PLACE 1 SPRAY UNDER THE TONGUE EVERY 5 MINUTES AS NEEDED FOR CHEST PAIN. MAY REPEAT 3 TIMES 12 g 1   potassium chloride SA (KLOR-CON M20) 20 MEQ tablet Take 1 tablet (20 mEq total) by mouth daily. 90 tablet 3   mupirocin  ointment (BACTROBAN) 2 % On leg wound w/dressing change qd or bid (Patient not taking: Reported on 03/02/2021) 30 g 0   No facility-administered medications prior to visit.    ROS: Review of Systems  Constitutional:  Negative for activity change, appetite change, chills, fatigue and unexpected weight change.  HENT:  Negative for congestion, mouth sores and sinus pressure.   Eyes:  Negative for visual disturbance.  Respiratory:  Negative for cough and chest tightness.   Gastrointestinal:  Negative for abdominal pain and nausea.  Genitourinary:  Negative for difficulty urinating, frequency and vaginal pain.  Musculoskeletal:  Positive for arthralgias. Negative for back pain and gait problem.  Skin:  Negative for pallor and rash.  Neurological:  Negative for dizziness, tremors, weakness, numbness and headaches.  Psychiatric/Behavioral:  Negative for confusion, sleep disturbance and suicidal ideas. The patient is nervous/anxious.    Objective:  BP (!) 162/88 (BP Location: Left Arm)   Pulse (!) 57   Temp 98 F (36.7 C) (Oral)   Ht 5\' 8"  (1.727 m)   Wt 139 lb 12.8 oz (63.4 kg)   SpO2 97%   BMI 21.26 kg/m   BP Readings from Last 3 Encounters:  03/02/21 (!) 162/88  02/02/21 (!) 162/80  01/12/21 (!) 148/82    Wt Readings from Last 3 Encounters:  03/02/21  139 lb 12.8 oz (63.4 kg)  02/02/21 138 lb 3.2 oz (62.7 kg)  01/12/21 139 lb 3.2 oz (63.1 kg)    Physical Exam Constitutional:      General: She is not in acute distress.    Appearance: She is well-developed.  HENT:     Head: Normocephalic.     Right Ear: External ear normal.     Left Ear: External ear normal.     Nose: Nose normal.  Eyes:     General:        Right eye: No discharge.        Left eye: No discharge.     Conjunctiva/sclera: Conjunctivae normal.     Pupils: Pupils are equal, round, and reactive to light.  Neck:     Thyroid: No thyromegaly.     Vascular: No JVD.     Trachea: No tracheal deviation.   Cardiovascular:     Rate and Rhythm: Normal rate and regular rhythm.     Heart sounds: Normal heart sounds.  Pulmonary:     Effort: No respiratory distress.     Breath sounds: No stridor. No wheezing.  Abdominal:     General: Bowel sounds are normal. There is no distension.     Palpations: Abdomen is soft. There is no mass.     Tenderness: There is no abdominal tenderness. There is no guarding or rebound.  Musculoskeletal:        General: No tenderness.     Cervical back: Normal range of motion and neck supple. No rigidity.  Lymphadenopathy:     Cervical: No cervical adenopathy.  Skin:    Findings: No erythema or rash.  Neurological:     Mental Status: She is oriented to person, place, and time.     Cranial Nerves: No cranial nerve deficit.     Motor: No abnormal muscle tone.     Coordination: Coordination normal.     Deep Tendon Reflexes: Reflexes normal.  Psychiatric:        Behavior: Behavior normal.        Thought Content: Thought content normal.        Judgment: Judgment normal.    Lab Results  Component Value Date   WBC 5.6 12/29/2019   HGB 12.3 12/29/2019   HCT 37.0 12/29/2019   PLT 268 12/29/2019   GLUCOSE 112 (H) 09/13/2020   CHOL 235 (H) 12/29/2019   TRIG 100 12/29/2019   HDL 69 12/29/2019   LDLDIRECT 188.2 01/10/2012   LDLCALC 145 (H) 12/29/2019   ALT 11 12/29/2019   AST 14 12/29/2019   NA 140 09/13/2020   K 3.7 09/13/2020   CL 101 09/13/2020   CREATININE 1.26 (H) 09/13/2020   BUN 14 09/13/2020   CO2 31 09/13/2020   TSH <0.01 (L) 05/16/2020   INR 1.0 08/01/2018    CT RENAL STONE STUDY  Result Date: 08/01/2018 CLINICAL DATA:  Hematuria, loose stools EXAM: CT ABDOMEN AND PELVIS WITHOUT CONTRAST TECHNIQUE: Multidetector CT imaging of the abdomen and pelvis was performed following the standard protocol without IV contrast. COMPARISON:  12/24/2014 FINDINGS: Lower chest: No acute abnormality. Coronary artery calcifications. Hepatobiliary: No focal liver  abnormality is seen. No gallstones, gallbladder wall thickening, or biliary dilatation. Pancreas: Unremarkable. No pancreatic ductal dilatation or surrounding inflammatory changes. Spleen: Normal in size without focal abnormality. Adrenals/Urinary Tract: Adrenal glands are unremarkable. Kidneys are normal, without renal calculi, focal lesion, or hydronephrosis. Multiple small calculi in the dependent urinary bladder as seen on  prior examination. Stomach/Bowel: Stomach is within normal limits. Appendix appears normal. No evidence of bowel wall thickening, distention, or inflammatory changes. Vascular/Lymphatic: Calcific atherosclerosis. No enlarged abdominal or pelvic lymph nodes. Reproductive: No mass or other abnormality. Status post hysterectomy. Other: No abdominal wall hernia or abnormality. No abdominopelvic ascites. Musculoskeletal: No acute or significant osseous findings. IMPRESSION: 1. No acute noncontrast CT findings of the abdomen or pelvis to explain hematuria or loose stools. 2. Multiple small calculi within the dependent urinary bladder, similar to prior study. Electronically Signed   By: Lauralyn Primes M.D.   On: 08/01/2018 15:42    Assessment & Plan:   Problem List Items Addressed This Visit     Anxiety disorder    Very happy w/her new dog Leo Lorazepam prn at hs only, Doxepine at hs      Depression    Lorazepam prn at hs only, Doxepine at hs      Hormone replacement therapy (HRT) - Primary    Continue w/depo-Estradiol 0.3 ml      Hypothyroidism    Cont w/Levothroid         Follow-up: Return in about 4 weeks (around 03/30/2021) for a follow-up visit.  Sonda Primes, MD

## 2021-03-02 NOTE — Assessment & Plan Note (Signed)
Very happy w/her new dog Leo Lorazepam prn at hs only, Doxepine at hs

## 2021-03-02 NOTE — Assessment & Plan Note (Signed)
Continue w/depo-Estradiol 0.3 ml °

## 2021-04-04 ENCOUNTER — Other Ambulatory Visit: Payer: Self-pay

## 2021-04-04 ENCOUNTER — Ambulatory Visit (INDEPENDENT_AMBULATORY_CARE_PROVIDER_SITE_OTHER): Payer: Medicare Other | Admitting: Internal Medicine

## 2021-04-04 ENCOUNTER — Encounter: Payer: Self-pay | Admitting: Internal Medicine

## 2021-04-04 DIAGNOSIS — E034 Atrophy of thyroid (acquired): Secondary | ICD-10-CM | POA: Diagnosis not present

## 2021-04-04 DIAGNOSIS — M10072 Idiopathic gout, left ankle and foot: Secondary | ICD-10-CM | POA: Diagnosis not present

## 2021-04-04 DIAGNOSIS — R609 Edema, unspecified: Secondary | ICD-10-CM

## 2021-04-04 DIAGNOSIS — F329 Major depressive disorder, single episode, unspecified: Secondary | ICD-10-CM | POA: Diagnosis not present

## 2021-04-04 DIAGNOSIS — Z79890 Hormone replacement therapy (postmenopausal): Secondary | ICD-10-CM | POA: Diagnosis not present

## 2021-04-04 MED ORDER — ESTRADIOL CYPIONATE 5 MG/ML IM OIL
3.0000 mg | TOPICAL_OIL | Freq: Once | INTRAMUSCULAR | Status: AC
  Administered 2021-04-04: 3 mg via INTRAMUSCULAR

## 2021-04-04 NOTE — Assessment & Plan Note (Signed)
No recent relapse. Cont w/good diet

## 2021-04-04 NOTE — Assessment & Plan Note (Signed)
Better - use Compression sleeves for swelling

## 2021-04-04 NOTE — Assessment & Plan Note (Signed)
Cont w/Levothroid Monitor TSH

## 2021-04-04 NOTE — Assessment & Plan Note (Signed)
Stable Continue w/depo-Estradiol 0.3 ml  Potential benefits of a long term HRT  use as well as potential risks  and complications were explained to the patient and were aknowledged.

## 2021-04-04 NOTE — Assessment & Plan Note (Signed)
Dog Simonne Come makes Megyn very happy!

## 2021-04-04 NOTE — Progress Notes (Signed)
Subjective:  Patient ID: [name], female    DOB: 05/13/1946  Age: 75 y.o. MRN: [id number]  CC: Follow-up (4 week f/u- Need to discuss klor con )   HPI [name] presents for menopausal hot flashes, gout, HTN  Outpatient Medications Prior to Visit  Medication Sig Dispense Refill   aspirin 81 MG EC tablet Take 81 mg by mouth daily.     Cholecalciferol 1000 UNITS tablet Take 1,000 Units by mouth daily.     cloNIDine (CATAPRES) 0.1 MG tablet Take 1 tablet (0.1 mg total) by mouth 3 (three) times daily as needed (take for BP>180/110). 90 tablet 3   clorazepate (TRANXENE) 15 MG tablet Take 1 tablet (15 mg total) by mouth 3 (three) times daily as needed for anxiety (do not take with Lorazepam). for anxiety 270 tablet 1   doxepin (SINEQUAN) 50 MG capsule Take 3 capsules (150 mg total) by mouth at bedtime. 270 capsule 3   estradiol (ESTRACE) 0.1 MG/GM vaginal cream Place 1 Applicatorful vaginally at bedtime as needed. 42.5 g 0   estradiol cypionate (DEPO-ESTRADIOL) 5 MG/ML injection Use 0.3 ml every 18-25 days IM 5 mL 3   furosemide (LASIX) 40 MG tablet Take 1-2 tablets (40-80 mg total) by mouth daily. 180 tablet 3   levothyroxine (SYNTHROID) 137 MCG tablet TAKE 1 TABLET BY MOUTH ONCE DAILY BEFORE BREAKFAST every other day and alternate with one half every other day 90 tablet 3   LORazepam (ATIVAN) 0.5 MG tablet TAKE 1 TABLET BY MOUTH AT BEDTIME 90 tablet 1   metoprolol tartrate (LOPRESSOR) 50 MG tablet Take 1 tablet (50 mg total) by mouth 3 (three) times daily. 270 tablet 3   NIFEdipine (PROCARDIA-XL/NIFEDICAL-XL) 30 MG 24 hr tablet Take 1 tablet (30 mg total) by mouth daily. 90 tablet 3   nitroGLYCERIN (NITROLINGUAL) 0.4 MG/SPRAY spray PLACE 1 SPRAY UNDER THE TONGUE EVERY 5 MINUTES AS NEEDED FOR CHEST PAIN. MAY REPEAT 3 TIMES 12 g 1   potassium chloride SA (KLOR-CON M20) 20 MEQ tablet Take 1 tablet (20 mEq total) by mouth daily. 90 tablet 3   No facility-administered medications prior to  visit.    ROS: Review of Systems  Constitutional:  Negative for activity change, appetite change, chills, fatigue and unexpected weight change.  HENT:  Negative for congestion, mouth sores and sinus pressure.   Eyes:  Negative for visual disturbance.  Respiratory:  Negative for cough and chest tightness.   Gastrointestinal:  Negative for abdominal pain and nausea.  Genitourinary:  Negative for difficulty urinating, frequency and vaginal pain.  Musculoskeletal:  Positive for arthralgias. Negative for back pain and gait problem.  Skin:  Negative for pallor and rash.  Neurological:  Negative for dizziness, tremors, weakness, numbness and headaches.  Psychiatric/Behavioral:  Negative for confusion and sleep disturbance. The patient is nervous/anxious.    Objective:  BP (!) 162/82 (BP Location: Left Arm)   Pulse 74   Temp 97.8 F (36.6 C) (Oral)   Ht 5\' 8"  (1.727 m)   Wt 140 lb 6.4 oz (63.7 kg)   SpO2 98%   BMI 21.35 kg/m   BP Readings from Last 3 Encounters:  04/04/21 (!) 162/82  03/02/21 (!) 162/88  02/02/21 (!) 162/80    Wt Readings from Last 3 Encounters:  04/04/21 140 lb 6.4 oz (63.7 kg)  03/02/21 139 lb 12.8 oz (63.4 kg)  02/02/21 138 lb 3.2 oz (62.7 kg)    Physical Exam Constitutional:  General: She is not in acute distress.    Appearance: Normal appearance. She is well-developed.  HENT:     Head: Normocephalic.     Right Ear: External ear normal.     Left Ear: External ear normal.     Nose: Nose normal.  Eyes:     General:        Right eye: No discharge.        Left eye: No discharge.     Conjunctiva/sclera: Conjunctivae normal.     Pupils: Pupils are equal, round, and reactive to light.  Neck:     Thyroid: No thyromegaly.     Vascular: No JVD.     Trachea: No tracheal deviation.  Cardiovascular:     Rate and Rhythm: Normal rate and regular rhythm.     Heart sounds: Normal heart sounds.  Pulmonary:     Effort: No respiratory distress.     Breath  sounds: No stridor. No wheezing.  Abdominal:     General: Bowel sounds are normal. There is no distension.     Palpations: Abdomen is soft. There is no mass.     Tenderness: There is no abdominal tenderness. There is no guarding or rebound.  Musculoskeletal:        General: No swelling or tenderness.     Cervical back: Normal range of motion and neck supple. No rigidity.  Lymphadenopathy:     Cervical: No cervical adenopathy.  Skin:    Findings: No erythema or rash.  Neurological:     Cranial Nerves: No cranial nerve deficit.     Motor: No abnormal muscle tone.     Coordination: Coordination normal.     Gait: Gait normal.     Deep Tendon Reflexes: Reflexes normal.  Psychiatric:        Behavior: Behavior normal.        Thought Content: Thought content normal.        Judgment: Judgment normal.    Lab Results  Component Value Date   WBC 8.3 03/02/2021   HGB 11.5 (L) 03/02/2021   HCT 34.7 (L) 03/02/2021   PLT 282.0 03/02/2021   GLUCOSE 91 03/02/2021   CHOL 235 (H) 12/29/2019   TRIG 100 12/29/2019   HDL 69 12/29/2019   LDLDIRECT 188.2 01/10/2012   LDLCALC 145 (H) 12/29/2019   ALT 12 03/02/2021   AST 22 03/02/2021   NA 140 03/02/2021   K 3.4 (L) 03/02/2021   CL 103 03/02/2021   CREATININE 1.18 03/02/2021   BUN 15 03/02/2021   CO2 31 03/02/2021   TSH <0.01 (L) 03/02/2021   INR 1.0 08/01/2018    CT RENAL STONE STUDY  Result Date: 08/01/2018 CLINICAL DATA:  Hematuria, loose stools EXAM: CT ABDOMEN AND PELVIS WITHOUT CONTRAST TECHNIQUE: Multidetector CT imaging of the abdomen and pelvis was performed following the standard protocol without IV contrast. COMPARISON:  12/24/2014 FINDINGS: Lower chest: No acute abnormality. Coronary artery calcifications. Hepatobiliary: No focal liver abnormality is seen. No gallstones, gallbladder wall thickening, or biliary dilatation. Pancreas: Unremarkable. No pancreatic ductal dilatation or surrounding inflammatory changes. Spleen: Normal in  size without focal abnormality. Adrenals/Urinary Tract: Adrenal glands are unremarkable. Kidneys are normal, without renal calculi, focal lesion, or hydronephrosis. Multiple small calculi in the dependent urinary bladder as seen on prior examination. Stomach/Bowel: Stomach is within normal limits. Appendix appears normal. No evidence of bowel wall thickening, distention, or inflammatory changes. Vascular/Lymphatic: Calcific atherosclerosis. No enlarged abdominal or pelvic lymph nodes. Reproductive: No mass or  other abnormality. Status post hysterectomy. Other: No abdominal wall hernia or abnormality. No abdominopelvic ascites. Musculoskeletal: No acute or significant osseous findings. IMPRESSION: 1. No acute noncontrast CT findings of the abdomen or pelvis to explain hematuria or loose stools. 2. Multiple small calculi within the dependent urinary bladder, similar to prior study. Electronically Signed   By: Eddie Candle M.D.   On: 08/01/2018 15:42    Assessment & Plan:   Problem List Items Addressed This Visit     Depression    Dog Frankey Poot makes Analeya very happy!      Edema    Better - use Compression sleeves for swelling      Gout    No recent relapse. Cont w/good diet      Hormone replacement therapy (HRT)    Stable Continue w/depo-Estradiol 0.3 ml  Potential benefits of a long term HRT  use as well as potential risks  and complications were explained to the patient and were aknowledged.      Hypothyroidism    Cont w/Levothroid Monitor TSH         No orders of the defined types were placed in this encounter.     Follow-up: No follow-ups on file.  Walker Kehr, MD

## 2021-04-10 ENCOUNTER — Ambulatory Visit (HOSPITAL_COMMUNITY)
Admission: EM | Admit: 2021-04-10 | Discharge: 2021-04-10 | Disposition: A | Discharge status: Home / Self Care | Payer: Medicare Other | Attending: Emergency Medicine | Admitting: Emergency Medicine

## 2021-04-10 ENCOUNTER — Other Ambulatory Visit: Payer: Self-pay

## 2021-04-10 ENCOUNTER — Encounter (HOSPITAL_COMMUNITY): Payer: Self-pay | Admitting: *Deleted

## 2021-04-10 DIAGNOSIS — R1013 Epigastric pain: Secondary | ICD-10-CM | POA: Diagnosis not present

## 2021-04-10 MED ORDER — METRONIDAZOLE 500 MG PO TABS
500.0000 mg | ORAL_TABLET | Freq: Two times a day (BID) | ORAL | 0 refills | Status: DC
Start: 2021-04-10 — End: 2021-05-03

## 2021-04-10 NOTE — ED Provider Notes (Signed)
[place]    CSN: [id number] Arrival date & time: 04/10/21  1317      History   Chief Complaint Chief Complaint  Patient presents with   Abdominal Pain    HPI [name] is a 75 y.o. female.   Patient presents with epigastric abdominal pain described as cramping for 3 weeks.  Endorses exposure to Giardia from her dog, endorses possible ingestion of fecal matter due to not wearing gloves or practicing consistent hand hygiene after cleaning up diarrhea.  Endorses that she was told by veterinarian that she could come in contact with spores if still on carpet or own dog hair. Denies diarrhea, constipation, nausea, vomiting, heartburn or indigestion, increased bloating, increased gas production, fever or chills.  Past Medical History:  Diagnosis Date   Anxiety state, unspecified    Bronchitis, acute    Carotid bruit    Coronary atherosclerosis of unspecified type of vessel, native or graft    multiple angioplasy procedures in 90's/early 2000's   De Quervain's tenosynovitis    Depressive disorder, not elsewhere classified    Female bladder prolapse    Lichen [id number]   Vaginal   Other and unspecified hyperlipidemia    Unspecified essential hypertension    Unspecified hypothyroidism     Patient Active Problem List   Diagnosis Date Noted   UTI (urinary tract infection) 01/12/2021   Dog bite, hand, left, subsequent encounter 09/28/2020   Arthritis of left knee 07/13/2020   Pain and swelling of right knee 06/05/2020   Left knee pain 06/05/2020   Hypokalemia 05/18/2020   Rash 03/24/2020   Insomnia 02/25/2020   Traumatic hematoma of buttock 12/31/2019   Right foot pain 11/20/2019   Bladder prolapse, female, acquired 06/25/2019   Statin myopathy 04/18/2019   Dog bite of arm 02/15/2019   Right arm pain 02/06/2019   Atrophic vaginitis 10/20/2018   Urinary bladder stone 08/06/2018   Hematuria, gross 08/06/2018   Edema 04/21/2018   Herpes zoster 02/20/2018    Occipital neuralgia of left side 01/07/2018   Hormone replacement therapy (HRT) 07/25/2017   Hair loss 05/31/2017   Upper respiratory infection 05/01/2017   Achilles tendinitis 03/12/2017   Gout 01/09/2017   Shoulder effusion, right 12/28/2016   Neck stiffness 12/28/2016   Heel pain, chronic, left 12/11/2016   Calcaneal bursitis (heel), left 11/13/2016   Toenail avulsion, initial encounter 11/13/2016   Traumatic hematoma of foot, right, sequela 09/27/2016   Adnexal mass 01/04/2016   Right adrenal mass ([place]) 12/29/2015   Cataract 10/10/2015   Abnormal abdominal x-ray 12/23/2014   GERD (gastroesophageal reflux disease) 11/07/2012   Hot flash, menopausal 09/10/2012   Well adult exam 06/13/2011   GAIT DISTURBANCE 11/22/2009   PARESTHESIA 11/22/2009   BACK PAIN, LUMBAR 11/03/2009   Hypothyroidism 02/11/2007   Dyslipidemia 02/11/2007   Anxiety disorder 02/11/2007   Depression 02/11/2007   Essential hypertension 02/11/2007   Coronary atherosclerosis 02/11/2007   DE QUERVAIN'S TENOSYNOVITIS 02/11/2007   CAROTID BRUIT 02/11/2007    Past Surgical History:  Procedure Laterality Date   MOUTH SURGERY     NOSE SURGERY  1999   TOTAL VAGINAL HYSTERECTOMY  1994    OB History     Gravida  3   Para  3   Term  3   Preterm      AB      Living  2      SAB      IAB      Ectopic  Multiple      Live Births  2            Home Medications    Prior to Admission medications   Medication Sig Start Date End Date Taking? Authorizing Provider  metroNIDAZOLE (FLAGYL) 500 MG tablet Take 1 tablet (500 mg total) by mouth 2 (two) times daily. 04/10/21  Yes Salli Quarry R, NP  aspirin 81 MG EC tablet Take 81 mg by mouth daily.    [provider]  Cholecalciferol 1000 UNITS tablet Take 1,000 Units by mouth daily.    [provider]  cloNIDine (CATAPRES) 0.1 MG tablet Take 1 tablet (0.1 mg total) by mouth 3 (three) times daily as needed (take for  BP>180/110). 08/16/20   Plotnikov, Georgina Quint, MD  clorazepate (TRANXENE) 15 MG tablet Take 1 tablet (15 mg total) by mouth 3 (three) times daily as needed for anxiety (do not take with Lorazepam). for anxiety 08/16/20   Plotnikov, Georgina Quint, MD  doxepin (SINEQUAN) 50 MG capsule Take 3 capsules (150 mg total) by mouth at bedtime. 08/16/20   Plotnikov, Georgina Quint, MD  estradiol (ESTRACE) 0.1 MG/GM vaginal cream Place 1 Applicatorful vaginally at bedtime as needed. 12/03/19   Plotnikov, Georgina Quint, MD  estradiol cypionate (DEPO-ESTRADIOL) 5 MG/ML injection Use 0.3 ml every 18-25 days IM 08/16/20   Plotnikov, Georgina Quint, MD  furosemide (LASIX) 40 MG tablet Take 1-2 tablets (40-80 mg total) by mouth daily. 08/16/20   Plotnikov, Georgina Quint, MD  levothyroxine (SYNTHROID) 137 MCG tablet TAKE 1 TABLET BY MOUTH ONCE DAILY BEFORE BREAKFAST every other day and alternate with one half every other day 08/29/20   Plotnikov, Georgina Quint, MD  LORazepam (ATIVAN) 0.5 MG tablet TAKE 1 TABLET BY MOUTH AT BEDTIME 03/02/21   Plotnikov, Georgina Quint, MD  metoprolol tartrate (LOPRESSOR) 50 MG tablet Take 1 tablet (50 mg total) by mouth 3 (three) times daily. 08/16/20   Plotnikov, Georgina Quint, MD  NIFEdipine (PROCARDIA-XL/NIFEDICAL-XL) 30 MG 24 hr tablet Take 1 tablet (30 mg total) by mouth daily. 08/16/20   Plotnikov, Georgina Quint, MD  nitroGLYCERIN (NITROLINGUAL) 0.4 MG/SPRAY spray PLACE 1 SPRAY UNDER THE TONGUE EVERY 5 MINUTES AS NEEDED FOR CHEST PAIN. MAY REPEAT 3 TIMES 06/09/19   Plotnikov, Georgina Quint, MD  potassium chloride SA (KLOR-CON M20) 20 MEQ tablet Take 1 tablet (20 mEq total) by mouth daily. 08/16/20   Plotnikov, Georgina Quint, MD    Family History Family History  Problem Relation Age of Onset   Hypertension Mother    Hypertension Father    Hypertension Other    Coronary artery disease Other     Social History Social History   Tobacco Use   Smoking status: Former    Types: Cigarettes    Quit date: 07/22/1991    Years since  quitting: 29.7   Smokeless tobacco: Never  Vaping Use   Vaping Use: Never used  Substance Use Topics   Alcohol use: No   Drug use: No     Allergies   Epinephrine, Isosorbide mononitrate, Lidocaine, Morphine, Propoxyphene n-acetaminophen, Alprazolam, Atorvastatin, Codeine, Ezetimibe, Prilosec [omeprazole], Rosuvastatin, Simvastatin, Allopurinol, Meloxicam, Tetracycline, Triple antibiotic [bacitracin-neomycin-polymyxin], Amoxicillin, Ciprofloxacin, and Diphenhydramine hcl   Review of Systems Review of Systems  Respiratory: Negative.    Cardiovascular: Negative.   Gastrointestinal:  Positive for abdominal pain. Negative for abdominal distention, anal bleeding, blood in stool, constipation, diarrhea, nausea, rectal pain and vomiting.  Skin: Negative.   Neurological: Negative.     Physical Exam Triage  Vital Signs ED Triage Vitals  Enc Vitals Group     BP 04/10/21 1411 (!) 187/92     Pulse Rate 04/10/21 1411 75     Resp 04/10/21 1411 18     Temp 04/10/21 1411 98.2 F (36.8 C)     Temp src --      SpO2 04/10/21 1411 99 %     Weight --      Height --      Head Circumference --      Peak Flow --      Pain Score 04/10/21 1410 10     Pain Loc --      Pain Edu? --      Excl. in [place]? --    No data found.  Updated Vital Signs BP (!) 187/92   Pulse 75   Temp 98.2 F (36.8 C)   Resp 18   SpO2 99%   Visual Acuity Right Eye Distance:   Left Eye Distance:   Bilateral Distance:    Right Eye Near:   Left Eye Near:    Bilateral Near:     Physical Exam Constitutional:      Appearance: She is well-developed and normal weight.  HENT:     Head: Normocephalic.  Eyes:     Extraocular Movements: Extraocular movements intact.  Pulmonary:     Effort: Pulmonary effort is normal.  Abdominal:     General: Abdomen is flat. Bowel sounds are increased.     Palpations: Abdomen is soft.     Tenderness: There is abdominal tenderness in the epigastric area. There is no right CVA  tenderness, left CVA tenderness or guarding. Negative signs include Murphy's sign.  Skin:    General: Skin is warm and dry.  Neurological:     General: No focal deficit present.     Mental Status: She is alert and oriented to person, place, and time.  Psychiatric:        Mood and Affect: Mood normal.        Behavior: Behavior normal.     UC Treatments / Results  Labs (all labs ordered are listed, but only abnormal results are displayed) Labs Reviewed  GASTROINTESTINAL PANEL BY PCR, STOOL (REPLACES STOOL CULTURE)    EKG   Radiology No results found.  Procedures Procedures (including critical care time)  Medications Ordered in UC Medications - No data to display  Initial Impression / Assessment and Plan / UC Course  I have reviewed the triage vital signs and the nursing notes.  Pertinent labs & imaging results that were available during my care of the patient were reviewed by me and considered in my medical decision making (see chart for details).  Epigastric abdominal pain  Low suspicion of actual Giardia infection, discussed with patient, will treat prophylactically due to age and obtain stool sample for GI panel, patient to return sample to lab within the next 2 to 3 days, verbalized understanding  1.  Metronidazole 500 mg twice daily for 7 days 2.  PCP follow-up in 2 weeks Final Clinical Impressions(s) / UC Diagnoses   Final diagnoses:  Abdominal pain, epigastric     Discharge Instructions      Please return to stool sample to clinic to evaluate bacteria  Take metronidazole twice a day for 7 days   Please follow up with PCP in 2 weeks for reevaluation        ED Prescriptions     Medication Sig Dispense Auth. Provider  metroNIDAZOLE (FLAGYL) 500 MG tablet Take 1 tablet (500 mg total) by mouth 2 (two) times daily. 14 tablet Licet Dunphy, Leitha Schuller, NP      PDMP not reviewed this encounter.   Hans Eden, NP 04/10/21 1801

## 2021-04-10 NOTE — ED Triage Notes (Signed)
Pt has ABD pain and thinks she caught Giardia from her dog.

## 2021-04-10 NOTE — Discharge Instructions (Addendum)
Please return to stool sample to clinic to evaluate bacteria  Take metronidazole twice a day for 7 days   Please follow up with PCP in 2 weeks for reevaluation

## 2021-05-01 ENCOUNTER — Ambulatory Visit: Payer: Medicare Other | Admitting: Internal Medicine

## 2021-05-03 ENCOUNTER — Ambulatory Visit (INDEPENDENT_AMBULATORY_CARE_PROVIDER_SITE_OTHER): Payer: Medicare Other | Admitting: Internal Medicine

## 2021-05-03 ENCOUNTER — Other Ambulatory Visit: Payer: Self-pay

## 2021-05-03 ENCOUNTER — Encounter: Payer: Self-pay | Admitting: Internal Medicine

## 2021-05-03 DIAGNOSIS — Z831 Family history of other infectious and parasitic diseases: Secondary | ICD-10-CM | POA: Diagnosis not present

## 2021-05-03 DIAGNOSIS — R1013 Epigastric pain: Secondary | ICD-10-CM | POA: Diagnosis not present

## 2021-05-03 DIAGNOSIS — F419 Anxiety disorder, unspecified: Secondary | ICD-10-CM

## 2021-05-03 DIAGNOSIS — F329 Major depressive disorder, single episode, unspecified: Secondary | ICD-10-CM

## 2021-05-03 DIAGNOSIS — Z79890 Hormone replacement therapy (postmenopausal): Secondary | ICD-10-CM

## 2021-05-03 MED ORDER — ESTRADIOL CYPIONATE 5 MG/ML IM OIL
3.0000 mg | TOPICAL_OIL | Freq: Once | INTRAMUSCULAR | Status: AC
  Administered 2021-05-03: 16:00:00 3 mg via INTRAMUSCULAR

## 2021-05-03 NOTE — Assessment & Plan Note (Signed)
Worse due to stress [name], her dog, was returned to her vet - he had Giardia w/diarrhea

## 2021-05-03 NOTE — Assessment & Plan Note (Signed)
Stress related. resolved

## 2021-05-03 NOTE — Progress Notes (Signed)
Subjective:  Patient ID: [name], female    DOB: [birth year]  Age: 75 y.o. MRN: [id number]  CC: Follow-up (4 week f/u- Need estradiol inj 0.3mg  )    HPI ARLOW GERLEMAN presents for a lot of personal stress since summer Leo, her dog, was returned to her vet - he had Giardia w/diarrhea C/o chronic abd cramps - went to ER, given Metronidazole empirically on 04/01/21 - pt took 3 doses Pt lost 7 lbs - a lot of stress  Outpatient Medications Prior to Visit  Medication Sig Dispense Refill   aspirin 81 MG EC tablet Take 81 mg by mouth daily.     Cholecalciferol 1000 UNITS tablet Take 1,000 Units by mouth daily.     cloNIDine (CATAPRES) 0.1 MG tablet Take 1 tablet (0.1 mg total) by mouth 3 (three) times daily as needed (take for BP>180/110). 90 tablet 3   clorazepate (TRANXENE) 15 MG tablet Take 1 tablet (15 mg total) by mouth 3 (three) times daily as needed for anxiety (do not take with Lorazepam). for anxiety 270 tablet 1   doxepin (SINEQUAN) 50 MG capsule Take 3 capsules (150 mg total) by mouth at bedtime. 270 capsule 3   estradiol cypionate (DEPO-ESTRADIOL) 5 MG/ML injection Use 0.3 ml every 18-25 days IM 5 mL 3   furosemide (LASIX) 40 MG tablet Take 1-2 tablets (40-80 mg total) by mouth daily. 180 tablet 3   levothyroxine (SYNTHROID) 137 MCG tablet TAKE 1 TABLET BY MOUTH ONCE DAILY BEFORE BREAKFAST every other day and alternate with one half every other day 90 tablet 3   LORazepam (ATIVAN) 0.5 MG tablet TAKE 1 TABLET BY MOUTH AT BEDTIME 90 tablet 1   metoprolol tartrate (LOPRESSOR) 50 MG tablet Take 1 tablet (50 mg total) by mouth 3 (three) times daily. 270 tablet 3   NIFEdipine (PROCARDIA-XL/NIFEDICAL-XL) 30 MG 24 hr tablet Take 1 tablet (30 mg total) by mouth daily. 90 tablet 3   nitroGLYCERIN (NITROLINGUAL) 0.4 MG/SPRAY spray PLACE 1 SPRAY UNDER THE TONGUE EVERY 5 MINUTES AS NEEDED FOR CHEST PAIN. MAY REPEAT 3 TIMES 12 g 1   potassium chloride SA (KLOR-CON M20) 20 MEQ tablet Take 1  tablet (20 mEq total) by mouth daily. 90 tablet 3   estradiol (ESTRACE) 0.1 MG/GM vaginal cream Place 1 Applicatorful vaginally at bedtime as needed. (Patient not taking: Reported on 05/03/2021) 42.5 g 0   metroNIDAZOLE (FLAGYL) 500 MG tablet Take 1 tablet (500 mg total) by mouth 2 (two) times daily. (Patient not taking: Reported on 05/03/2021) 14 tablet 0   No facility-administered medications prior to visit.    ROS: Review of Systems  Constitutional:  Positive for unexpected weight change. Negative for activity change, appetite change, chills and fatigue.  HENT:  Negative for congestion, mouth sores and sinus pressure.   Eyes:  Negative for visual disturbance.  Respiratory:  Negative for cough and chest tightness.   Gastrointestinal:  Negative for abdominal pain and nausea.  Genitourinary:  Negative for difficulty urinating, frequency and vaginal pain.  Musculoskeletal:  Negative for back pain and gait problem.  Skin:  Negative for pallor and rash.  Neurological:  Negative for dizziness, tremors, weakness, numbness and headaches.  Psychiatric/Behavioral:  Positive for dysphoric mood. Negative for confusion, sleep disturbance and suicidal ideas. The patient is nervous/anxious.    Objective:  BP (!) 168/84 (BP Location: Left Arm)    Pulse 80    Temp 97.9 F (36.6 C) (Oral)    Ht 5\' 8"  (  1.727 m)    Wt 132 lb 6.4 oz (60.1 kg)    SpO2 99%    BMI 20.13 kg/m   BP Readings from Last 3 Encounters:  05/03/21 (!) 168/84  04/10/21 (!) 187/92  04/04/21 (!) 162/82    Wt Readings from Last 3 Encounters:  05/03/21 132 lb 6.4 oz (60.1 kg)  04/04/21 140 lb 6.4 oz (63.7 kg)  03/02/21 139 lb 12.8 oz (63.4 kg)    Physical Exam Constitutional:      General: She is not in acute distress.    Appearance: She is well-developed.  HENT:     Head: Normocephalic.     Right Ear: External ear normal.     Left Ear: External ear normal.     Nose: Nose normal.  Eyes:     General:        Right eye: No  discharge.        Left eye: No discharge.     Conjunctiva/sclera: Conjunctivae normal.     Pupils: Pupils are equal, round, and reactive to light.  Neck:     Thyroid: No thyromegaly.     Vascular: No JVD.     Trachea: No tracheal deviation.  Cardiovascular:     Rate and Rhythm: Normal rate and regular rhythm.     Heart sounds: Normal heart sounds.  Pulmonary:     Effort: No respiratory distress.     Breath sounds: No stridor. No wheezing.  Abdominal:     General: Bowel sounds are normal. There is no distension.     Palpations: Abdomen is soft. There is no mass.     Tenderness: There is no abdominal tenderness. There is no guarding or rebound.  Musculoskeletal:        General: No tenderness.     Cervical back: Normal range of motion and neck supple. No rigidity.  Lymphadenopathy:     Cervical: No cervical adenopathy.  Skin:    Findings: No erythema or rash.  Neurological:     Mental Status: She is oriented to person, place, and time.     Cranial Nerves: No cranial nerve deficit.     Motor: No abnormal muscle tone.     Coordination: Coordination normal.     Deep Tendon Reflexes: Reflexes normal.  Psychiatric:        Behavior: Behavior normal.        Thought Content: Thought content normal.        Judgment: Judgment normal.  anxious  Lab Results  Component Value Date   WBC 8.3 03/02/2021   HGB 11.5 (L) 03/02/2021   HCT 34.7 (L) 03/02/2021   PLT 282.0 03/02/2021   GLUCOSE 91 03/02/2021   CHOL 235 (H) 12/29/2019   TRIG 100 12/29/2019   HDL 69 12/29/2019   LDLDIRECT 188.2 01/10/2012   LDLCALC 145 (H) 12/29/2019   ALT 12 03/02/2021   AST 22 03/02/2021   NA 140 03/02/2021   K 3.4 (L) 03/02/2021   CL 103 03/02/2021   CREATININE 1.18 03/02/2021   BUN 15 03/02/2021   CO2 31 03/02/2021   TSH <0.01 (L) 03/02/2021   INR 1.0 08/01/2018    No results found.  Assessment & Plan:   Problem List Items Addressed This Visit     Anxiety disorder    Stress        Depression    Worse due to stress Simonne Come, her dog, was returned to her vet - he had Giardia w/diarrhea  Epigastric pain    Stress related. resolved      Relevant Orders   Giardia/cryptosporidium (EIA)   Family history of giardiasis    C/o chronic abd cramps - went to ER, given Metronidazole empirically on 04/01/21 - pt took 3 doses Stool test Frankey Poot, her dog, was returned to her vet - he had Giardia w/diarrhea      Relevant Orders   Giardia/cryptosporidium (EIA)   Hormone replacement therapy (HRT)    Continue w/depo-Estradiol 0.3 ml         Meds ordered this encounter  Medications   estradiol cypionate (DEPO-ESTRADIOL) 5 MG/ML injection 3 mg       Follow-up: Return in about 4 weeks (around 05/31/2021) for a follow-up visit.  Walker Kehr, MD

## 2021-05-03 NOTE — Assessment & Plan Note (Signed)
Stress

## 2021-05-03 NOTE — Assessment & Plan Note (Addendum)
C/o chronic abd cramps - went to ER, given Metronidazole empirically on 04/01/21 - pt took 3 doses Stool test [name], her dog, was returned to her vet - he had Giardia w/diarrhea

## 2021-05-03 NOTE — Assessment & Plan Note (Signed)
Continue w/depo-Estradiol 0.3 ml

## 2021-05-25 DIAGNOSIS — Z1231 Encounter for screening mammogram for malignant neoplasm of breast: Secondary | ICD-10-CM | POA: Diagnosis not present

## 2021-06-06 ENCOUNTER — Other Ambulatory Visit: Payer: Self-pay

## 2021-06-06 ENCOUNTER — Encounter: Payer: Self-pay | Admitting: Internal Medicine

## 2021-06-06 ENCOUNTER — Ambulatory Visit (INDEPENDENT_AMBULATORY_CARE_PROVIDER_SITE_OTHER): Payer: Medicare Other | Admitting: Internal Medicine

## 2021-06-06 VITALS — BP 162/82 | HR 67 | Temp 97.9°F | Ht 68.0 in | Wt 136.8 lb

## 2021-06-06 DIAGNOSIS — I2510 Atherosclerotic heart disease of native coronary artery without angina pectoris: Secondary | ICD-10-CM | POA: Diagnosis not present

## 2021-06-06 DIAGNOSIS — Z79890 Hormone replacement therapy (postmenopausal): Secondary | ICD-10-CM

## 2021-06-06 DIAGNOSIS — M10072 Idiopathic gout, left ankle and foot: Secondary | ICD-10-CM

## 2021-06-06 DIAGNOSIS — N1831 Chronic kidney disease, stage 3a: Secondary | ICD-10-CM | POA: Insufficient documentation

## 2021-06-06 DIAGNOSIS — F419 Anxiety disorder, unspecified: Secondary | ICD-10-CM

## 2021-06-06 DIAGNOSIS — N183 Chronic kidney disease, stage 3 unspecified: Secondary | ICD-10-CM | POA: Diagnosis not present

## 2021-06-06 MED ORDER — ESTRADIOL CYPIONATE 5 MG/ML IM OIL
3.0000 mg | TOPICAL_OIL | Freq: Once | INTRAMUSCULAR | Status: AC
  Administered 2021-06-06: 3 mg via INTRAMUSCULAR

## 2021-06-06 NOTE — Assessment & Plan Note (Signed)
Lorazepam prn at hs only, Doxepine at hs Pt taking meds at night (not when driving)

## 2021-06-06 NOTE — Assessment & Plan Note (Signed)
Cont on Toprol, Procardia

## 2021-06-06 NOTE — Progress Notes (Signed)
Subjective:  Patient ID: [name], female    DOB: 06/15/1945  Age: 76 y.o. MRN: [id number]  CC: Follow-up (4 week f/u- Need her Estradiol inj 0.3 mg)   HPI [name] presents for menopausal sweats, stress, CAD f/u   Outpatient Medications Prior to Visit  Medication Sig Dispense Refill   aspirin 81 MG EC tablet Take 81 mg by mouth daily.     Cholecalciferol 1000 UNITS tablet Take 1,000 Units by mouth daily.     cloNIDine (CATAPRES) 0.1 MG tablet Take 1 tablet (0.1 mg total) by mouth 3 (three) times daily as needed (take for BP>180/110). 90 tablet 3   clorazepate (TRANXENE) 15 MG tablet Take 1 tablet (15 mg total) by mouth 3 (three) times daily as needed for anxiety (do not take with Lorazepam). for anxiety 270 tablet 1   doxepin (SINEQUAN) 50 MG capsule Take 3 capsules (150 mg total) by mouth at bedtime. 270 capsule 3   estradiol cypionate (DEPO-ESTRADIOL) 5 MG/ML injection Use 0.3 ml every 18-25 days IM 5 mL 3   furosemide (LASIX) 40 MG tablet Take 1-2 tablets (40-80 mg total) by mouth daily. 180 tablet 3   levothyroxine (SYNTHROID) 137 MCG tablet TAKE 1 TABLET BY MOUTH ONCE DAILY BEFORE BREAKFAST every other day and alternate with one half every other day 90 tablet 3   LORazepam (ATIVAN) 0.5 MG tablet TAKE 1 TABLET BY MOUTH AT BEDTIME 90 tablet 1   metoprolol tartrate (LOPRESSOR) 50 MG tablet Take 1 tablet (50 mg total) by mouth 3 (three) times daily. 270 tablet 3   NIFEdipine (PROCARDIA-XL/NIFEDICAL-XL) 30 MG 24 hr tablet Take 1 tablet (30 mg total) by mouth daily. 90 tablet 3   nitroGLYCERIN (NITROLINGUAL) 0.4 MG/SPRAY spray PLACE 1 SPRAY UNDER THE TONGUE EVERY 5 MINUTES AS NEEDED FOR CHEST PAIN. MAY REPEAT 3 TIMES 12 g 1   potassium chloride SA (KLOR-CON M20) 20 MEQ tablet Take 1 tablet (20 mEq total) by mouth daily. 90 tablet 3   No facility-administered medications prior to visit.    ROS: Review of Systems  Constitutional:  Positive for unexpected weight change. Negative  for activity change, appetite change, chills and fatigue.  HENT:  Negative for congestion, mouth sores and sinus pressure.   Eyes:  Negative for visual disturbance.  Respiratory:  Negative for cough and chest tightness.   Gastrointestinal:  Negative for abdominal distention, abdominal pain, blood in stool, diarrhea and nausea.  Genitourinary:  Negative for difficulty urinating, frequency and vaginal pain.  Musculoskeletal:  Negative for back pain and gait problem.  Skin:  Negative for pallor and rash.  Neurological:  Negative for dizziness, tremors, weakness, numbness and headaches.  Psychiatric/Behavioral:  Negative for confusion, sleep disturbance and suicidal ideas. The patient is nervous/anxious.    Objective:  BP (!) 162/82 (BP Location: Left Arm)    Pulse 67    Temp 97.9 F (36.6 C) (Oral)    Ht 5\' 8"  (1.727 m)    Wt 136 lb 12.8 oz (62.1 kg)    SpO2 97%    BMI 20.80 kg/m   BP Readings from Last 3 Encounters:  06/06/21 (!) 162/82  05/03/21 (!) 168/84  04/10/21 (!) 187/92    Wt Readings from Last 3 Encounters:  06/06/21 136 lb 12.8 oz (62.1 kg)  05/03/21 132 lb 6.4 oz (60.1 kg)  04/04/21 140 lb 6.4 oz (63.7 kg)    Physical Exam Constitutional:      General: She is not in  acute distress.    Appearance: She is well-developed.  HENT:     Head: Normocephalic.     Right Ear: External ear normal.     Left Ear: External ear normal.     Nose: Nose normal.  Eyes:     General:        Right eye: No discharge.        Left eye: No discharge.     Conjunctiva/sclera: Conjunctivae normal.     Pupils: Pupils are equal, round, and reactive to light.  Neck:     Thyroid: No thyromegaly.     Vascular: No JVD.     Trachea: No tracheal deviation.  Cardiovascular:     Rate and Rhythm: Normal rate and regular rhythm.     Heart sounds: Normal heart sounds.  Pulmonary:     Effort: No respiratory distress.     Breath sounds: No stridor. No wheezing.  Abdominal:     General: Bowel sounds  are normal. There is no distension.     Palpations: Abdomen is soft. There is no mass.     Tenderness: There is no abdominal tenderness. There is no guarding or rebound.  Musculoskeletal:        General: No tenderness.     Cervical back: Normal range of motion and neck supple. No rigidity.  Lymphadenopathy:     Cervical: No cervical adenopathy.  Skin:    Findings: No erythema or rash.  Neurological:     Mental Status: She is oriented to person, place, and time.     Cranial Nerves: No cranial nerve deficit.     Motor: No abnormal muscle tone.     Coordination: Coordination normal.     Deep Tendon Reflexes: Reflexes normal.  Psychiatric:        Behavior: Behavior normal.        Thought Content: Thought content normal.        Judgment: Judgment normal.    Lab Results  Component Value Date   WBC 8.3 03/02/2021   HGB 11.5 (L) 03/02/2021   HCT 34.7 (L) 03/02/2021   PLT 282.0 03/02/2021   GLUCOSE 91 03/02/2021   CHOL 235 (H) 12/29/2019   TRIG 100 12/29/2019   HDL 69 12/29/2019   LDLDIRECT 188.2 01/10/2012   LDLCALC 145 (H) 12/29/2019   ALT 12 03/02/2021   AST 22 03/02/2021   NA 140 03/02/2021   K 3.4 (L) 03/02/2021   CL 103 03/02/2021   CREATININE 1.18 03/02/2021   BUN 15 03/02/2021   CO2 31 03/02/2021   TSH <0.01 (L) 03/02/2021   INR 1.0 08/01/2018    No results found.  Assessment & Plan:   Problem List Items Addressed This Visit     Anxiety disorder    Lorazepam prn at hs only, Doxepine at hs Pt taking meds at night (not when driving)      Coronary atherosclerosis    Cont on Toprol, Procardia      CRI (chronic renal insufficiency), stage 3 (moderate) (HCC)    Hydrate well Monitor GFR      Gout    No recent relapse      Hormone replacement therapy (HRT) - Primary    Continue w/Depo-Estradiol 0.3 ml  Potential benefits of a long term HRT  use as well as potential risks  and complications were explained to the patient and were aknowledged.          Meds ordered this encounter  Medications   estradiol  cypionate (DEPO-ESTRADIOL) 5 MG/ML injection 3 mg      Follow-up: Return in about 4 weeks (around 07/04/2021) for a follow-up visit.  Walker Kehr, MD

## 2021-06-06 NOTE — Assessment & Plan Note (Signed)
No recent relapse 

## 2021-06-06 NOTE — Progress Notes (Addendum)
Pls cosign for Estradiol 3mg  inj../lmb  Medical screening examination/treatment/procedure(s) were performed by non-physician practitioner and as supervising physician I was immediately available for consultation/collaboration.  I agree with above. Lew Dawes, MD

## 2021-06-06 NOTE — Assessment & Plan Note (Signed)
Continue w/Depo-Estradiol 0.3 ml  Potential benefits of a long term HRT  use as well as potential risks  and complications were explained to the patient and were aknowledged.

## 2021-06-06 NOTE — Assessment & Plan Note (Signed)
Hydrate well ?Monitor GFR ?

## 2021-07-04 ENCOUNTER — Encounter: Payer: Self-pay | Admitting: Internal Medicine

## 2021-07-04 ENCOUNTER — Ambulatory Visit (INDEPENDENT_AMBULATORY_CARE_PROVIDER_SITE_OTHER): Payer: Medicare Other | Admitting: Internal Medicine

## 2021-07-04 ENCOUNTER — Other Ambulatory Visit: Payer: Self-pay

## 2021-07-04 DIAGNOSIS — N183 Chronic kidney disease, stage 3 unspecified: Secondary | ICD-10-CM | POA: Diagnosis not present

## 2021-07-04 DIAGNOSIS — E034 Atrophy of thyroid (acquired): Secondary | ICD-10-CM | POA: Diagnosis not present

## 2021-07-04 DIAGNOSIS — L853 Xerosis cutis: Secondary | ICD-10-CM

## 2021-07-04 DIAGNOSIS — Z79890 Hormone replacement therapy (postmenopausal): Secondary | ICD-10-CM

## 2021-07-04 DIAGNOSIS — F4321 Adjustment disorder with depressed mood: Secondary | ICD-10-CM | POA: Diagnosis not present

## 2021-07-04 DIAGNOSIS — F419 Anxiety disorder, unspecified: Secondary | ICD-10-CM | POA: Diagnosis not present

## 2021-07-04 DIAGNOSIS — I10 Essential (primary) hypertension: Secondary | ICD-10-CM | POA: Diagnosis not present

## 2021-07-04 DIAGNOSIS — F329 Major depressive disorder, single episode, unspecified: Secondary | ICD-10-CM

## 2021-07-04 NOTE — Assessment & Plan Note (Signed)
Chronic Continue w/Levothroid

## 2021-07-04 NOTE — Assessment & Plan Note (Signed)
Grieving his brother death in Feb 01, [id number] yo) - discussed

## 2021-07-04 NOTE — Assessment & Plan Note (Signed)
Coping ok Doxepin at hs

## 2021-07-04 NOTE — Assessment & Plan Note (Signed)
Continue w/Depo-Estradiol 0.3 ml IM  Potential benefits of a long term HRT  use as well as potential risks  and complications were explained to the patient and were aknowledged.

## 2021-07-04 NOTE — Assessment & Plan Note (Signed)
Hydrate well.  Continue to monitor GFR 

## 2021-07-04 NOTE — Progress Notes (Signed)
Subjective:  Patient ID: [name], female    DOB: 1945/09/21  Age: 76 y.o. MRN: [id number]  CC: No chief complaint on file.   HPI [name] presents for grieving his brother death in June 03, 2023 09-01-93 yo) F/u HTN, HRT, anxiety  Outpatient Medications Prior to Visit  Medication Sig Dispense Refill   aspirin 81 MG EC tablet Take 81 mg by mouth daily.     Cholecalciferol 1000 UNITS tablet Take 1,000 Units by mouth daily.     cloNIDine (CATAPRES) 0.1 MG tablet Take 1 tablet (0.1 mg total) by mouth 3 (three) times daily as needed (take for BP>180/110). 90 tablet 3   clorazepate (TRANXENE) 15 MG tablet Take 1 tablet (15 mg total) by mouth 3 (three) times daily as needed for anxiety (do not take with Lorazepam). for anxiety 270 tablet 1   doxepin (SINEQUAN) 50 MG capsule Take 3 capsules (150 mg total) by mouth at bedtime. 270 capsule 3   estradiol cypionate (DEPO-ESTRADIOL) 5 MG/ML injection Use 0.3 ml every 18-25 days IM 5 mL 3   furosemide (LASIX) 40 MG tablet Take 1-2 tablets (40-80 mg total) by mouth daily. 180 tablet 3   levothyroxine (SYNTHROID) 137 MCG tablet TAKE 1 TABLET BY MOUTH ONCE DAILY BEFORE BREAKFAST every other day and alternate with one half every other day 90 tablet 3   LORazepam (ATIVAN) 0.5 MG tablet TAKE 1 TABLET BY MOUTH AT BEDTIME 90 tablet 1   metoprolol tartrate (LOPRESSOR) 50 MG tablet Take 1 tablet (50 mg total) by mouth 3 (three) times daily. 270 tablet 3   NIFEdipine (PROCARDIA-XL/NIFEDICAL-XL) 30 MG 24 hr tablet Take 1 tablet (30 mg total) by mouth daily. 90 tablet 3   nitroGLYCERIN (NITROLINGUAL) 0.4 MG/SPRAY spray PLACE 1 SPRAY UNDER THE TONGUE EVERY 5 MINUTES AS NEEDED FOR CHEST PAIN. MAY REPEAT 3 TIMES 12 g 1   potassium chloride SA (KLOR-CON M20) 20 MEQ tablet Take 1 tablet (20 mEq total) by mouth daily. 90 tablet 3   No facility-administered medications prior to visit.    ROS: Review of Systems  Constitutional:  Negative for activity change, appetite  change, chills, fatigue and unexpected weight change.  HENT:  Negative for congestion, mouth sores and sinus pressure.   Eyes:  Negative for visual disturbance.  Respiratory:  Negative for cough and chest tightness.   Gastrointestinal:  Negative for abdominal pain and nausea.  Genitourinary:  Negative for difficulty urinating, frequency and vaginal pain.  Musculoskeletal:  Positive for arthralgias. Negative for back pain and gait problem.  Skin:  Negative for pallor and rash.  Neurological:  Negative for dizziness, tremors, weakness, numbness and headaches.  Psychiatric/Behavioral:  Positive for dysphoric mood. Negative for confusion, sleep disturbance and suicidal ideas. The patient is nervous/anxious.    Objective:  BP 132/84 (BP Location: Left Arm, Patient Position: Sitting, Cuff Size: Large)    Pulse 69    Temp 98.3 F (36.8 C) (Oral)    Ht 5\' 8"  (1.727 m)    Wt 136 lb (61.7 kg)    SpO2 94%    BMI 20.68 kg/m   BP Readings from Last 3 Encounters:  07/04/21 132/84  06/06/21 (!) 162/82  05/03/21 (!) 168/84    Wt Readings from Last 3 Encounters:  07/04/21 136 lb (61.7 kg)  06/06/21 136 lb 12.8 oz (62.1 kg)  05/03/21 132 lb 6.4 oz (60.1 kg)    Physical Exam Constitutional:      General: She is not  in acute distress.    Appearance: She is well-developed.  HENT:     Head: Normocephalic.     Right Ear: External ear normal.     Left Ear: External ear normal.     Nose: Nose normal.  Eyes:     General:        Right eye: No discharge.        Left eye: No discharge.     Conjunctiva/sclera: Conjunctivae normal.     Pupils: Pupils are equal, round, and reactive to light.  Neck:     Thyroid: No thyromegaly.     Vascular: No JVD.     Trachea: No tracheal deviation.  Cardiovascular:     Rate and Rhythm: Normal rate and regular rhythm.     Heart sounds: Normal heart sounds.  Pulmonary:     Effort: No respiratory distress.     Breath sounds: No stridor. No wheezing.  Abdominal:      General: Bowel sounds are normal. There is no distension.     Palpations: Abdomen is soft. There is no mass.     Tenderness: There is no abdominal tenderness. There is no guarding or rebound.  Musculoskeletal:        General: No tenderness.     Cervical back: Normal range of motion and neck supple. No rigidity.  Lymphadenopathy:     Cervical: No cervical adenopathy.  Skin:    Findings: No erythema or rash.  Neurological:     Cranial Nerves: No cranial nerve deficit.     Motor: No abnormal muscle tone.     Coordination: Coordination normal.     Deep Tendon Reflexes: Reflexes normal.  Psychiatric:        Behavior: Behavior normal.        Thought Content: Thought content normal.        Judgment: Judgment normal.    Lab Results  Component Value Date   WBC 8.3 03/02/2021   HGB 11.5 (L) 03/02/2021   HCT 34.7 (L) 03/02/2021   PLT 282.0 03/02/2021   GLUCOSE 91 03/02/2021   CHOL 235 (H) 12/29/2019   TRIG 100 12/29/2019   HDL 69 12/29/2019   LDLDIRECT 188.2 01/10/2012   LDLCALC 145 (H) 12/29/2019   ALT 12 03/02/2021   AST 22 03/02/2021   NA 140 03/02/2021   K 3.4 (L) 03/02/2021   CL 103 03/02/2021   CREATININE 1.18 03/02/2021   BUN 15 03/02/2021   CO2 31 03/02/2021   TSH <0.01 (L) 03/02/2021   INR 1.0 08/01/2018    No results found.  Assessment & Plan:   Problem List Items Addressed This Visit     Anxiety disorder    Lorazepam prn at hs only, Doxepine at hs      CRI (chronic renal insufficiency), stage 3 (moderate) (HCC)    Hydrate well Continue to monitor GFR      Relevant Orders   Comprehensive metabolic panel   Depression    Coping ok Doxepin at hs      Essential hypertension    Stable. The best we can do. Continue w/Toprol, Nifedipine, Furosemide 80 mg q am BP ok at home Clonidine prn for SBP>190      Relevant Orders   Comprehensive metabolic panel   Grief    Grieving his brother death in 06/10/21 [birth year] yo) - discussed      Hormone  replacement therapy (HRT)    Continue w/Depo-Estradiol 0.3 ml IM  Potential benefits of a  long term HRT  use as well as potential risks  and complications were explained to the patient and were aknowledged.      Hypothyroidism    Chronic Continue w/Levothroid         No orders of the defined types were placed in this encounter.     Follow-up: Return in about 4 weeks (around 08/01/2021) for a follow-up visit.  Walker Kehr, MD

## 2021-07-04 NOTE — Assessment & Plan Note (Signed)
Stable. The best we can do. Continue w/Toprol, Nifedipine, Furosemide 80 mg q am BP ok at home Clonidine prn for SBP>190 

## 2021-07-04 NOTE — Patient Instructions (Signed)
Patellofemoral Pain Syndrome Patellofemoral pain syndrome is a condition in which the tissue (cartilage) on the underside of the kneecap (patella) softens or breaks down. This causes pain in the front of the knee. The condition is also called runner's knee or chondromalacia patella. Patellofemoral pain syndrome is most common in young adults who are active in sports. The knee is the largest joint in the body. The patella covers the front of the knee and is attached to muscles above and below the knee. The underside of the patella is covered with a smooth type of cartilage (synovium). The smooth surface helps the patella glide easily when you move your knee. Patellofemoral pain syndrome causes swelling in the joint linings and bone surfaces in the knee. What are the causes? This condition may be caused by: Overuse of the knee. Poor alignment of your knee joints. Weak leg muscles. A direct hit to your kneecap. What increases the risk? You are more likely to develop this condition if: You do a lot of activities that can wear down your kneecap. These include: Running. Squatting. Climbing stairs. You start a new physical activity or exercise program. You wear shoes that do not fit well. You do not have good leg strength. You are overweight. What are the signs or symptoms? The main symptom of this condition is knee pain. This may feel like a dull, aching pain underneath your patella, in the front of your knee. There may be a popping or cracking sound when you move your knee. Pain may get worse with: Exercise. Climbing stairs. Running. Jumping. Squatting. Kneeling. Sitting for a long time. Moving or pushing on your patella. How is this diagnosed? This condition may be diagnosed based on: Your symptoms and medical history. You may be asked about your recent physical activities and which ones cause knee pain. A physical exam. This may include: Moving your patella back and forth. Checking  your range of knee motion. Having you squat or jump to see if you have pain. Checking the strength of your leg muscles. Imaging tests to confirm the diagnosis. These may include an MRI of your knee. How is this treated? This condition may be treated at home with rest, ice, compression, and elevation (RICE).  Other treatments may include: NSAIDs, such as ibuprofen. Physical therapy to stretch and strengthen your leg muscles. Shoe inserts (orthotics) to take stress off your knee. A knee brace or knee support. Adhesive tapes to the skin. Surgery to remove damaged cartilage or move the patella to a better position. This is rare. Follow these instructions at home: If you have a brace: Wear the brace as told by your health care provider. Remove it only as told by your health care provider. Loosen the brace if your toes tingle, become numb, or turn cold and blue. Keep the brace clean. If the brace is not waterproof: Do not let it get wet. Cover it with a watertight covering when you take a bath or a shower. Managing pain, stiffness, and swelling  If directed, put ice on the painful area. To do this: If you have a removable brace, remove it as told by your health care provider. Put ice in a plastic bag. Place a towel between your skin and the bag. Leave the ice on for 20 minutes, 2-3 times a day. Remove the ice if your skin turns bright red. This is very important. If you cannot feel pain, heat, or cold, you have a greater risk of damage to the area. Move   your toes often to reduce stiffness and swelling. Raise (elevate) the injured area above the level of your heart while you are sitting or lying down. Activity Rest your knee. Avoid activities that cause knee pain. Perform stretching and strengthening exercises as told by your health care provider or physical therapist. Return to your normal activities as told by your health care provider. Ask your health care provider what activities are  safe for you. General instructions Take over-the-counter and prescription medicines only as told by your health care provider. Use splints, braces, knee supports, or walking aids as directed by your health care provider. Do not use any products that contain nicotine or tobacco, such as cigarettes, e-cigarettes, and chewing tobacco. These can delay healing. If you need help quitting, ask your health care provider. Keep all follow-up visits. This is important. Contact a health care provider if: Your symptoms get worse. You are not improving with home care. Summary Patellofemoral pain syndrome is a condition in which the tissue (cartilage) on the underside of the kneecap (patella) softens or breaks down. This condition causes swelling in the joint linings and bone surfaces in the knee. This leads to pain in the front of the knee. This condition may be treated at home with rest, ice, compression, and elevation (RICE). Use splints, braces, knee supports, or walking aids as directed by your health care provider. This information is not intended to replace advice given to you by your health care provider. Make sure you discuss any questions you have with your health care provider. Document Revised: 10/21/2019 Document Reviewed: 10/21/2019 Elsevier Patient Education  2022 [employer]  

## 2021-07-04 NOTE — Assessment & Plan Note (Signed)
Lorazepam prn at hs only, Doxepine at hs

## 2021-07-31 ENCOUNTER — Other Ambulatory Visit (INDEPENDENT_AMBULATORY_CARE_PROVIDER_SITE_OTHER): Payer: Medicare Other

## 2021-07-31 ENCOUNTER — Other Ambulatory Visit: Payer: Self-pay

## 2021-07-31 ENCOUNTER — Other Ambulatory Visit: Payer: Self-pay | Admitting: Internal Medicine

## 2021-07-31 DIAGNOSIS — N183 Chronic kidney disease, stage 3 unspecified: Secondary | ICD-10-CM

## 2021-07-31 DIAGNOSIS — I10 Essential (primary) hypertension: Secondary | ICD-10-CM

## 2021-07-31 LAB — COMPREHENSIVE METABOLIC PANEL
ALT: 10 U/L (ref 0–35)
AST: 16 U/L (ref 0–37)
Albumin: 3.9 g/dL (ref 3.5–5.2)
Alkaline Phosphatase: 88 U/L (ref 39–117)
BUN: 18 mg/dL (ref 6–23)
CO2: 29 mEq/L (ref 19–32)
Calcium: 9.3 mg/dL (ref 8.4–10.5)
Chloride: 102 mEq/L (ref 96–112)
Creatinine, Ser: 1.27 mg/dL — ABNORMAL HIGH (ref 0.40–1.20)
GFR: 40.67 mL/min — ABNORMAL LOW (ref >60.00)
Glucose, Bld: 50 mg/dL — ABNORMAL LOW (ref 70–99)
Potassium: 3.7 mEq/L (ref 3.5–5.1)
Sodium: 141 mEq/L (ref 135–145)
Total Bilirubin: 0.4 mg/dL (ref 0.2–1.2)
Total Protein: 6.6 g/dL (ref 6.0–8.3)

## 2021-08-01 NOTE — Telephone Encounter (Signed)
Patient is requesting a refill of the following medications: ?Requested Prescriptions  ? ?Pending Prescriptions Disp Refills  ? clorazepate (TRANXENE) 15 MG tablet [Pharmacy Med Name: Clorazepate Dipotassium 15 MG Oral Tablet] 270 tablet 0  ?  Sig: TAKE 1 TABLET BY MOUTH THREE TIMES DAILY AS NEEDED FOR ANXIETY (DO  NOT  TAKE  WITH  LORAZEPAM)  ? ? ?Date of patient request: 07/31/2021 ?Last office visit: 07/04/2021 ?Date of last refill: per PMP 02/01/2021 ?Last refill amount: 270 tabs 1 refill  ?Follow up time period per chart: 08/03/2021  ?

## 2021-08-03 ENCOUNTER — Other Ambulatory Visit: Payer: Self-pay

## 2021-08-03 ENCOUNTER — Encounter: Payer: Self-pay | Admitting: Internal Medicine

## 2021-08-03 ENCOUNTER — Ambulatory Visit (INDEPENDENT_AMBULATORY_CARE_PROVIDER_SITE_OTHER): Payer: Medicare Other | Admitting: Internal Medicine

## 2021-08-03 DIAGNOSIS — I10 Essential (primary) hypertension: Secondary | ICD-10-CM

## 2021-08-03 DIAGNOSIS — N183 Chronic kidney disease, stage 3 unspecified: Secondary | ICD-10-CM | POA: Diagnosis not present

## 2021-08-03 DIAGNOSIS — F419 Anxiety disorder, unspecified: Secondary | ICD-10-CM

## 2021-08-03 DIAGNOSIS — Z79890 Hormone replacement therapy (postmenopausal): Secondary | ICD-10-CM

## 2021-08-03 DIAGNOSIS — F329 Major depressive disorder, single episode, unspecified: Secondary | ICD-10-CM | POA: Diagnosis not present

## 2021-08-03 MED ORDER — CLOBETASOL PROPIONATE 0.05 % EX OINT: 1.0000 "application " | TOPICAL_OINTMENT | Freq: Two times a day (BID) | CUTANEOUS | 3 refills | Status: DC | PRN

## 2021-08-03 MED ORDER — LEVOTHYROXINE SODIUM 137 MCG PO TABS: ORAL_TABLET | ORAL | 3 refills | Status: DC

## 2021-08-03 MED ORDER — FUROSEMIDE 40 MG PO TABS: 40.0000 mg | ORAL_TABLET | Freq: Every day | ORAL | 3 refills | Status: DC

## 2021-08-03 MED ORDER — DOXEPIN HCL 50 MG PO CAPS: 150.0000 mg | ORAL_CAPSULE | Freq: Every day | ORAL | 3 refills | Status: DC

## 2021-08-03 MED ORDER — LORAZEPAM 0.5 MG PO TABS: 0.5000 mg | ORAL_TABLET | Freq: Every day | ORAL | 1 refills | Status: DC

## 2021-08-03 MED ORDER — METOPROLOL TARTRATE 50 MG PO TABS: 50.0000 mg | ORAL_TABLET | Freq: Three times a day (TID) | ORAL | 3 refills | Status: DC

## 2021-08-03 MED ORDER — POTASSIUM CHLORIDE CRYS ER 20 MEQ PO TBCR: 20.0000 meq | EXTENDED_RELEASE_TABLET | Freq: Every day | ORAL | 3 refills | Status: DC

## 2021-08-03 MED ORDER — NIFEDIPINE ER OSMOTIC RELEASE 30 MG PO TB24: 30.0000 mg | ORAL_TABLET | Freq: Every day | ORAL | 3 refills | Status: DC

## 2021-08-03 MED ORDER — CLORAZEPATE DIPOTASSIUM 15 MG PO TABS: ORAL_TABLET | ORAL | 1 refills | Status: DC

## 2021-08-03 NOTE — Assessment & Plan Note (Signed)
Doing well ?Doxepin at hs ?

## 2021-08-03 NOTE — Progress Notes (Signed)
? ?Subjective:  ?Patient ID: [name], female    DOB: [birth year]  Age: 76 y.o. MRN: [id number] ? ?CC: No chief complaint on file. ? ? ?HPI ?Jenene Slicker presents for hot flashes, gout, HTN, hypothyroidism ? ?Outpatient Medications Prior to Visit  ?Medication Sig Dispense Refill  ? aspirin 81 MG EC tablet Take 81 mg by mouth daily.    ? Cholecalciferol 1000 UNITS tablet Take 1,000 Units by mouth daily.    ? cloNIDine (CATAPRES) 0.1 MG tablet Take 1 tablet (0.1 mg total) by mouth 3 (three) times daily as needed (take for BP>180/110). 90 tablet 3  ? estradiol cypionate (DEPO-ESTRADIOL) 5 MG/ML injection Use 0.3 ml every 18-25 days IM 5 mL 3  ? nitroGLYCERIN (NITROLINGUAL) 0.4 MG/SPRAY spray PLACE 1 SPRAY UNDER THE TONGUE EVERY 5 MINUTES AS NEEDED FOR CHEST PAIN. MAY REPEAT 3 TIMES 12 g 1  ? clorazepate (TRANXENE) 15 MG tablet TAKE 1 TABLET BY MOUTH THREE TIMES DAILY AS NEEDED FOR ANXIETY (DO  NOT  TAKE  WITH  LORAZEPAM) 270 tablet 0  ? doxepin (SINEQUAN) 50 MG capsule Take 3 capsules (150 mg total) by mouth at bedtime. 270 capsule 3  ? furosemide (LASIX) 40 MG tablet Take 1-2 tablets (40-80 mg total) by mouth daily. 180 tablet 3  ? levothyroxine (SYNTHROID) 137 MCG tablet TAKE 1 TABLET BY MOUTH ONCE DAILY BEFORE BREAKFAST every other day and alternate with one half every other day 90 tablet 3  ? LORazepam (ATIVAN) 0.5 MG tablet TAKE 1 TABLET BY MOUTH AT BEDTIME 90 tablet 1  ? metoprolol tartrate (LOPRESSOR) 50 MG tablet Take 1 tablet (50 mg total) by mouth 3 (three) times daily. 270 tablet 3  ? NIFEdipine (PROCARDIA-XL/NIFEDICAL-XL) 30 MG 24 hr tablet Take 1 tablet (30 mg total) by mouth daily. 90 tablet 3  ? potassium chloride SA (KLOR-CON M20) 20 MEQ tablet Take 1 tablet (20 mEq total) by mouth daily. 90 tablet 3  ? ?No facility-administered medications prior to visit.  ? ? ?ROS: ?Review of Systems  ?Constitutional:  Positive for diaphoresis and fatigue. Negative for activity change, appetite change, chills and  unexpected weight change.  ?HENT:  Negative for congestion, mouth sores and sinus pressure.   ?Eyes:  Negative for visual disturbance.  ?Respiratory:  Negative for cough and chest tightness.   ?Cardiovascular:  Positive for leg swelling. Negative for palpitations.  ?Gastrointestinal:  Negative for abdominal pain and nausea.  ?Genitourinary:  Negative for difficulty urinating, frequency and vaginal pain.  ?Musculoskeletal:  Positive for arthralgias. Negative for back pain and gait problem.  ?Skin:  Negative for pallor and rash.  ?Neurological:  Negative for dizziness, tremors, weakness, numbness and headaches.  ?Psychiatric/Behavioral:  Positive for sleep disturbance. Negative for confusion. The patient is nervous/anxious.   ? ?Objective:  ?BP (!) 180/80 (BP Location: Left Arm, Patient Position: Sitting, Cuff Size: Large)   Pulse (!) 59   Temp 98 ?F (36.7 ?C) (Oral)   Ht 5\' 8"  (1.727 m)   Wt 136 lb (61.7 kg)   SpO2 97%   BMI 20.68 kg/m?  ? ?BP Readings from Last 3 Encounters:  ?08/03/21 (!) 180/80  ?07/04/21 132/84  ?06/06/21 (!) 162/82  ? ? ?Wt Readings from Last 3 Encounters:  ?08/03/21 136 lb (61.7 kg)  ?07/04/21 136 lb (61.7 kg)  ?06/06/21 136 lb 12.8 oz (62.1 kg)  ? ? ?Physical Exam ?Constitutional:   ?   General: She is not in acute distress. ?   Appearance:  She is well-developed.  ?HENT:  ?   Head: Normocephalic.  ?   Right Ear: External ear normal.  ?   Left Ear: External ear normal.  ?   Nose: Nose normal.  ?Eyes:  ?   General:     ?   Right eye: No discharge.     ?   Left eye: No discharge.  ?   Conjunctiva/sclera: Conjunctivae normal.  ?   Pupils: Pupils are equal, round, and reactive to light.  ?Neck:  ?   Thyroid: No thyromegaly.  ?   Vascular: No JVD.  ?   Trachea: No tracheal deviation.  ?Cardiovascular:  ?   Rate and Rhythm: Normal rate and regular rhythm.  ?   Heart sounds: Normal heart sounds.  ?Pulmonary:  ?   Effort: No respiratory distress.  ?   Breath sounds: No stridor. No wheezing.   ?Abdominal:  ?   General: Bowel sounds are normal. There is no distension.  ?   Palpations: Abdomen is soft. There is no mass.  ?   Tenderness: There is no abdominal tenderness. There is no guarding or rebound.  ?Musculoskeletal:     ?   General: No tenderness.  ?   Cervical back: Normal range of motion and neck supple. No rigidity.  ?   Right lower leg: Edema present.  ?   Left lower leg: Edema present.  ?Lymphadenopathy:  ?   Cervical: No cervical adenopathy.  ?Skin: ?   Findings: No erythema or rash.  ?Neurological:  ?   Mental Status: She is oriented to person, place, and time.  ?   Cranial Nerves: No cranial nerve deficit.  ?   Motor: No abnormal muscle tone.  ?   Coordination: Coordination normal.  ?   Gait: Gait normal.  ?   Deep Tendon Reflexes: Reflexes normal.  ?Psychiatric:     ?   Behavior: Behavior normal.     ?   Thought Content: Thought content normal.     ?   Judgment: Judgment normal.  ? ? ?Lab Results  ?Component Value Date  ? WBC 8.3 03/02/2021  ? HGB 11.5 (L) 03/02/2021  ? HCT 34.7 (L) 03/02/2021  ? PLT 282.0 03/02/2021  ? GLUCOSE 50 (L) 07/31/2021  ? CHOL 235 (H) 12/29/2019  ? TRIG 100 12/29/2019  ? HDL 69 12/29/2019  ? LDLDIRECT 188.2 01/10/2012  ? LDLCALC 145 (H) 12/29/2019  ? ALT 10 07/31/2021  ? AST 16 07/31/2021  ? NA 141 07/31/2021  ? K 3.7 07/31/2021  ? CL 102 07/31/2021  ? CREATININE 1.27 (H) 07/31/2021  ? BUN 18 07/31/2021  ? CO2 29 07/31/2021  ? TSH <0.01 (L) 03/02/2021  ? INR 1.0 08/01/2018  ? ? ?No results found. ? ?Assessment & Plan:  ? ?Problem List Items Addressed This Visit   ? ? Anxiety disorder  ?  Lorazepam prn at hs only, Doxepine at hs ?Pt taking meds at night (not when driving). ?Tranzene - rare use ?  ?  ? Relevant Medications  ? clorazepate (TRANXENE) 15 MG tablet  ? doxepin (SINEQUAN) 50 MG capsule  ? LORazepam (ATIVAN) 0.5 MG tablet  ? CRI (chronic renal insufficiency), stage 3 (moderate) (HCC)  ?  Monitor GFR ?  ?  ? Depression  ?  Doing well ?Doxepin at hs ?  ?  ?  Relevant Medications  ? clorazepate (TRANXENE) 15 MG tablet  ? doxepin (SINEQUAN) 50 MG capsule  ? LORazepam (ATIVAN) 0.5  MG tablet  ? Essential hypertension  ?  BP Readings from Last 3 Encounters:  ?08/03/21 (!) 180/80  ?07/04/21 132/84  ?06/06/21 (!) 162/82  ?Marland Kitchen The best we can do. Continue w/Toprol, Nifedipine, Furosemide 80 mg q am ?BP ok at home ?Clonidine prn for SBP>190 ? ?  ?  ? Relevant Medications  ? furosemide (LASIX) 40 MG tablet  ? metoprolol tartrate (LOPRESSOR) 50 MG tablet  ? NIFEdipine (PROCARDIA-XL/NIFEDICAL-XL) 30 MG 24 hr tablet  ? Hormone replacement therapy (HRT)  ?  Continue w/Depo-Estradiol 0.3 ml IM ? Potential benefits of a long term HRT  use as well as potential risks  and complications were explained to the patient and were aknowledged. ?  ?  ?  ? ? ?Meds ordered this encounter  ?Medications  ? clorazepate (TRANXENE) 15 MG tablet  ?  Sig: TAKE 1 TABLET BY MOUTH THREE TIMES DAILY AS NEEDED FOR ANXIETY (DO  NOT  TAKE  WITH  LORAZEPAM)  ?  Dispense:  270 tablet  ?  Refill:  1  ? doxepin (SINEQUAN) 50 MG capsule  ?  Sig: Take 3 capsules (150 mg total) by mouth at bedtime.  ?  Dispense:  270 capsule  ?  Refill:  3  ? furosemide (LASIX) 40 MG tablet  ?  Sig: Take 1-2 tablets (40-80 mg total) by mouth daily.  ?  Dispense:  180 tablet  ?  Refill:  3  ? levothyroxine (SYNTHROID) 137 MCG tablet  ?  Sig: TAKE 1 TABLET BY MOUTH ONCE DAILY BEFORE BREAKFAST every other day and alternate with one half every other day  ?  Dispense:  90 tablet  ?  Refill:  3  ? LORazepam (ATIVAN) 0.5 MG tablet  ?  Sig: Take 1 tablet (0.5 mg total) by mouth at bedtime.  ?  Dispense:  90 tablet  ?  Refill:  1  ? metoprolol tartrate (LOPRESSOR) 50 MG tablet  ?  Sig: Take 1 tablet (50 mg total) by mouth 3 (three) times daily.  ?  Dispense:  270 tablet  ?  Refill:  3  ? NIFEdipine (PROCARDIA-XL/NIFEDICAL-XL) 30 MG 24 hr tablet  ?  Sig: Take 1 tablet (30 mg total) by mouth daily.  ?  Dispense:  90 tablet  ?  Refill:  3  ?  potassium chloride SA (KLOR-CON M20) 20 MEQ tablet  ?  Sig: Take 1 tablet (20 mEq total) by mouth daily.  ?  Dispense:  90 tablet  ?  Refill:  3  ? clobetasol ointment (TEMOVATE) 0.05 %  ?  Sig: Apply 1 application. topica

## 2021-08-03 NOTE — Assessment & Plan Note (Signed)
BP Readings from Last 3 Encounters:  ?08/03/21 (!) 180/80  ?07/04/21 132/84  ?06/06/21 (!) 162/82  ?Marland Kitchen The best we can do. Continue w/Toprol, Nifedipine, Furosemide 80 mg q am ?BP ok at home ?Clonidine prn for SBP>190 ? ?

## 2021-08-03 NOTE — Assessment & Plan Note (Signed)
Continue w/Depo-Estradiol 0.3 ml IM ° Potential benefits of a long term HRT  use as well as potential risks  and complications were explained to the patient and were aknowledged. °

## 2021-08-03 NOTE — Assessment & Plan Note (Signed)
Monitor GFR 

## 2021-08-03 NOTE — Assessment & Plan Note (Signed)
Lorazepam prn at hs only, Doxepine at hs Pt taking meds at night (not when driving). Tranzene - rare use 

## 2021-08-17 ENCOUNTER — Other Ambulatory Visit: Payer: Self-pay | Admitting: Internal Medicine

## 2021-08-21 ENCOUNTER — Encounter: Payer: Self-pay | Admitting: Internal Medicine

## 2021-08-21 ENCOUNTER — Other Ambulatory Visit: Payer: Self-pay | Admitting: Internal Medicine

## 2021-08-22 ENCOUNTER — Encounter: Payer: Self-pay | Admitting: Internal Medicine

## 2021-08-22 NOTE — Telephone Encounter (Signed)
Not on current medication list. Pt sent MyChart message yesterday regarding recent laceration on her arm and requesting this medication.  ?

## 2021-08-23 ENCOUNTER — Other Ambulatory Visit: Payer: Self-pay | Admitting: Internal Medicine

## 2021-08-23 MED ORDER — MUPIROCIN 2 % EX OINT: TOPICAL_OINTMENT | CUTANEOUS | 0 refills | Status: DC

## 2021-08-28 MED ORDER — MUPIROCIN 2 % EX OINT: TOPICAL_OINTMENT | CUTANEOUS | 0 refills | Status: DC

## 2021-08-31 ENCOUNTER — Encounter: Payer: Self-pay | Admitting: Internal Medicine

## 2021-08-31 ENCOUNTER — Ambulatory Visit (INDEPENDENT_AMBULATORY_CARE_PROVIDER_SITE_OTHER): Payer: Medicare Other | Admitting: Internal Medicine

## 2021-08-31 DIAGNOSIS — E034 Atrophy of thyroid (acquired): Secondary | ICD-10-CM | POA: Diagnosis not present

## 2021-08-31 DIAGNOSIS — F419 Anxiety disorder, unspecified: Secondary | ICD-10-CM

## 2021-08-31 DIAGNOSIS — F329 Major depressive disorder, single episode, unspecified: Secondary | ICD-10-CM

## 2021-08-31 DIAGNOSIS — Z79890 Hormone replacement therapy (postmenopausal): Secondary | ICD-10-CM | POA: Diagnosis not present

## 2021-08-31 DIAGNOSIS — N183 Chronic kidney disease, stage 3 unspecified: Secondary | ICD-10-CM

## 2021-08-31 DIAGNOSIS — Z789 Other specified health status: Secondary | ICD-10-CM

## 2021-08-31 DIAGNOSIS — I10 Essential (primary) hypertension: Secondary | ICD-10-CM

## 2021-08-31 MED ORDER — ESTRADIOL 0.1 MG/GM VA CREA: TOPICAL_CREAM | VAGINAL | 3 refills | Status: DC

## 2021-08-31 MED ORDER — DEPO-ESTRADIOL 5 MG/ML IM OIL: TOPICAL_OIL | INTRAMUSCULAR | 3 refills | Status: DC

## 2021-08-31 NOTE — Assessment & Plan Note (Signed)
Chronic ?Continue w/Levothroid ?

## 2021-08-31 NOTE — Assessment & Plan Note (Signed)
Myalgias on statins ?

## 2021-08-31 NOTE — Assessment & Plan Note (Signed)
Cont on prn Tranzene - rare use ? Potential benefits of a long term benzodiazepines  use as well as potential risks  and complications were explained to the patient and were aknowledged. ?

## 2021-08-31 NOTE — Progress Notes (Signed)
? ?Subjective:  ?Patient ID: [name], female    DOB: [birth year]  Age: 76 y.o. MRN: [id number] ? ?CC: No chief complaint on file. ? ? ?HPI ?[name] presents for CAD, menopausal sweats, anxiety, HTN f/u ? ?Outpatient Medications Prior to Visit  ?Medication Sig Dispense Refill  ? aspirin 81 MG EC tablet Take 81 mg by mouth daily.    ? Cholecalciferol 1000 UNITS tablet Take 1,000 Units by mouth daily.    ? clobetasol ointment (TEMOVATE) [id number] % Apply 1 application. topically 2 (two) times daily as needed. 30 g 3  ? cloNIDine (CATAPRES) 0.1 MG tablet Take 1 tablet (0.1 mg total) by mouth 3 (three) times daily as needed (take for BP>180/110). 90 tablet 3  ? clorazepate (TRANXENE) 15 MG tablet TAKE 1 TABLET BY MOUTH THREE TIMES DAILY AS NEEDED FOR ANXIETY (DO  NOT  TAKE  WITH  LORAZEPAM) 270 tablet 1  ? doxepin (SINEQUAN) 50 MG capsule Take 3 capsules (150 mg total) by mouth at bedtime. 270 capsule 3  ? furosemide (LASIX) 40 MG tablet Take 1-2 tablets (40-80 mg total) by mouth daily. 180 tablet 3  ? levothyroxine (SYNTHROID) 137 MCG tablet TAKE 1 TABLET BY MOUTH ONCE DAILY BEFORE BREAKFAST every other day and alternate with one half every other day 90 tablet 3  ? LORazepam (ATIVAN) 0.5 MG tablet Take 1 tablet (0.5 mg total) by mouth at bedtime. 90 tablet 1  ? metoprolol tartrate (LOPRESSOR) 50 MG tablet Take 1 tablet (50 mg total) by mouth 3 (three) times daily. 270 tablet 3  ? mupirocin ointment (BACTROBAN) 2 % On leg wound w/dressing change qd or bid 30 g 0  ? mupirocin ointment (BACTROBAN) 2 % APPLY OINTMENT TOPICALLY TO LEG WOUND WITH DRESSING CHANGE ONCE OR TWICE A DAY 22 g 0  ? NIFEdipine (PROCARDIA-XL/NIFEDICAL-XL) 30 MG 24 hr tablet Take 1 tablet (30 mg total) by mouth daily. 90 tablet 3  ? nitroGLYCERIN (NITROLINGUAL) 0.4 MG/SPRAY spray PLACE 1 SPRAY UNDER THE TONGUE EVERY 5 MINUTES AS NEEDED FOR CHEST PAIN. MAY REPEAT 3 TIMES 12 g 1  ? potassium chloride SA (KLOR-CON M20) 20 MEQ tablet Take 1 tablet (20  mEq total) by mouth daily. 90 tablet 3  ? estradiol (ESTRACE) 0.1 MG/GM vaginal cream INSERT 1 APPLICATORFUL VAGINALLY 3 TIMES PER WEEK 43 g 0  ? estradiol cypionate (DEPO-ESTRADIOL) 5 MG/ML injection Use 0.3 ml every 18-25 days IM 5 mL 3  ? ?No facility-administered medications prior to visit.  ? ? ?ROS: ?Review of Systems  ?Constitutional:  Negative for activity change, appetite change, chills, fatigue and unexpected weight change.  ?HENT:  Negative for congestion, mouth sores and sinus pressure.   ?Eyes:  Negative for visual disturbance.  ?Respiratory:  Negative for cough and chest tightness.   ?Gastrointestinal:  Negative for abdominal pain and nausea.  ?Genitourinary:  Negative for difficulty urinating, frequency and vaginal pain.  ?Musculoskeletal:  Positive for arthralgias. Negative for back pain and gait problem.  ?Skin:  Negative for pallor and rash.  ?Neurological:  Negative for dizziness, tremors, weakness, numbness and headaches.  ?Hematological:  Bruises/bleeds easily.  ?Psychiatric/Behavioral:  Positive for sleep disturbance. Negative for confusion and suicidal ideas. The patient is nervous/anxious.   ? ?Objective:  ?BP (!) 168/86 (BP Location: Left Arm, Patient Position: Sitting, Cuff Size: Large)   Pulse 84   Temp 98.7 ?F (37.1 ?C) (Oral)   Ht 5\' 8"  (1.727 m)   Wt 132 lb (59.9 kg)  SpO2 97%   BMI 20.07 kg/m?  ? ?BP Readings from Last 3 Encounters:  ?08/31/21 (!) 168/86  ?08/03/21 (!) 180/80  ?07/04/21 132/84  ? ? ?Wt Readings from Last 3 Encounters:  ?08/31/21 132 lb (59.9 kg)  ?08/03/21 136 lb (61.7 kg)  ?07/04/21 136 lb (61.7 kg)  ? ? ?Physical Exam ?Constitutional:   ?   General: She is not in acute distress. ?   Appearance: Normal appearance. She is well-developed.  ?HENT:  ?   Head: Normocephalic.  ?   Right Ear: External ear normal.  ?   Left Ear: External ear normal.  ?   Nose: Nose normal.  ?Eyes:  ?   General:     ?   Right eye: No discharge.     ?   Left eye: No discharge.  ?    Conjunctiva/sclera: Conjunctivae normal.  ?   Pupils: Pupils are equal, round, and reactive to light.  ?Neck:  ?   Thyroid: No thyromegaly.  ?   Vascular: No JVD.  ?   Trachea: No tracheal deviation.  ?Cardiovascular:  ?   Rate and Rhythm: Normal rate and regular rhythm.  ?   Heart sounds: Normal heart sounds.  ?Pulmonary:  ?   Effort: No respiratory distress.  ?   Breath sounds: No stridor. No wheezing.  ?Abdominal:  ?   General: Bowel sounds are normal. There is no distension.  ?   Palpations: Abdomen is soft. There is no mass.  ?   Tenderness: There is no abdominal tenderness. There is no guarding or rebound.  ?Musculoskeletal:     ?   General: No tenderness.  ?   Cervical back: Normal range of motion and neck supple. No rigidity.  ?Lymphadenopathy:  ?   Cervical: No cervical adenopathy.  ?Skin: ?   Findings: No erythema or rash.  ?Neurological:  ?   Mental Status: She is oriented to person, place, and time.  ?   Cranial Nerves: No cranial nerve deficit.  ?   Motor: No abnormal muscle tone.  ?   Coordination: Coordination normal.  ?   Deep Tendon Reflexes: Reflexes normal.  ?Psychiatric:     ?   Behavior: Behavior normal.     ?   Thought Content: Thought content normal.     ?   Judgment: Judgment normal.  ? ? ?Lab Results  ?Component Value Date  ? WBC 8.3 03/02/2021  ? HGB 11.5 (L) 03/02/2021  ? HCT 34.7 (L) 03/02/2021  ? PLT 282.0 03/02/2021  ? GLUCOSE 50 (L) 07/31/2021  ? CHOL 235 (H) 12/29/2019  ? TRIG 100 12/29/2019  ? HDL 69 12/29/2019  ? LDLDIRECT 188.2 01/10/2012  ? LDLCALC 145 (H) 12/29/2019  ? ALT 10 07/31/2021  ? AST 16 07/31/2021  ? NA 141 07/31/2021  ? K 3.7 07/31/2021  ? CL 102 07/31/2021  ? CREATININE 1.27 (H) 07/31/2021  ? BUN 18 07/31/2021  ? CO2 29 07/31/2021  ? TSH <0.01 (L) 03/02/2021  ? INR 1.0 08/01/2018  ? ? ?No results found. ? ?Assessment & Plan:  ? ?Problem List Items Addressed This Visit   ? ? Hypothyroidism  ?  Chronic ?Continue w/Levothroid ?  ?  ? Anxiety disorder  ?  Cont on prn  Tranzene - rare use ? Potential benefits of a long term benzodiazepines  use as well as potential risks  and complications were explained to the patient and were aknowledged. ?  ?  ? Depression  ?  Cont w/Doxepin at hs ?  ?  ? Essential hypertension  ?  Clonidine prn for SBP>190 ?  ?  ? Hormone replacement therapy (HRT)  ?  Chronic HRT estradiol injections for severe hot flashes ?Continue w/Depo-Estradiol 0.3 ml IM monthly ? Potential benefits of a long term HRT  use as well as potential risks  and complications were explained to the patient and were aknowledged. ? ?  ?  ? CRI (chronic renal insufficiency), stage 3 (moderate) (HCC)  ?  Hydrate well ?Continue to monitor GFR ?  ?  ? Statin intolerance  ?  Myalgias on statins ?  ?  ?  ? ? ?Meds ordered this encounter  ?Medications  ? estradiol cypionate (DEPO-ESTRADIOL) 5 MG/ML injection  ?  Sig: Use 0.3 ml every 18-25 days IM  ?  Dispense:  5 mL  ?  Refill:  3  ? estradiol (ESTRACE) 0.1 MG/GM vaginal cream  ?  Sig: INSERT 1 APPLICATORFUL VAGINALLY 3 TIMES PER WEEK  ?  Dispense:  43 g  ?  Refill:  3  ?  ? ? ?Follow-up: Return in about 4 weeks (around 09/28/2021) for a follow-up visit. ? ?Walker Kehr, MD ?

## 2021-08-31 NOTE — Assessment & Plan Note (Signed)
Hydrate well.  Continue to monitor GFR 

## 2021-08-31 NOTE — Assessment & Plan Note (Signed)
Chronic HRT estradiol injections for severe hot flashes ?Continue w/Depo-Estradiol 0.3 ml IM monthly ? Potential benefits of a long term HRT  use as well as potential risks  and complications were explained to the patient and were aknowledged. ? ?

## 2021-08-31 NOTE — Assessment & Plan Note (Signed)
Cont w/Doxepin at hs ?

## 2021-08-31 NOTE — Assessment & Plan Note (Signed)
Clonidine prn for SBP>190 ?

## 2021-09-28 ENCOUNTER — Encounter: Payer: Self-pay | Admitting: Internal Medicine

## 2021-09-28 ENCOUNTER — Ambulatory Visit (INDEPENDENT_AMBULATORY_CARE_PROVIDER_SITE_OTHER): Payer: Medicare Other | Admitting: Internal Medicine

## 2021-09-28 VITALS — BP 124/64 | HR 62 | Ht 68.0 in | Wt 137.8 lb

## 2021-09-28 DIAGNOSIS — I10 Essential (primary) hypertension: Secondary | ICD-10-CM | POA: Diagnosis not present

## 2021-09-28 DIAGNOSIS — Z79890 Hormone replacement therapy (postmenopausal): Secondary | ICD-10-CM | POA: Diagnosis not present

## 2021-09-28 DIAGNOSIS — F329 Major depressive disorder, single episode, unspecified: Secondary | ICD-10-CM | POA: Diagnosis not present

## 2021-09-28 DIAGNOSIS — N951 Menopausal and female climacteric states: Secondary | ICD-10-CM

## 2021-09-28 DIAGNOSIS — N183 Chronic kidney disease, stage 3 unspecified: Secondary | ICD-10-CM | POA: Diagnosis not present

## 2021-09-28 MED ORDER — ESTRADIOL CYPIONATE 5 MG/ML IM OIL
5.0000 mg | TOPICAL_OIL | Freq: Once | INTRAMUSCULAR | Status: AC
  Administered 2021-09-28: 5 mg via INTRAMUSCULAR

## 2021-09-28 NOTE — Assessment & Plan Note (Signed)
Hydrate well.  Continue to monitor GFR 

## 2021-09-28 NOTE — Progress Notes (Signed)
? ?Subjective:  ?Patient ID: [name], female    DOB: [birth year]  Age: 76 y.o. MRN: [id number] ? ?CC: Follow-up (She is here for her shot. ) ? ? ?HPI ?[name] presents for menopause, gout, anxiety ? ?Outpatient Medications Prior to Visit  ?Medication Sig Dispense Refill  ? aspirin 81 MG EC tablet Take 81 mg by mouth daily.    ? Cholecalciferol 1000 UNITS tablet Take 1,000 Units by mouth daily.    ? clobetasol ointment (TEMOVATE) [id number] % Apply 1 application. topically 2 (two) times daily as needed. 30 g 3  ? cloNIDine (CATAPRES) 0.1 MG tablet Take 1 tablet (0.1 mg total) by mouth 3 (three) times daily as needed (take for BP>180/110). 90 tablet 3  ? clorazepate (TRANXENE) 15 MG tablet TAKE 1 TABLET BY MOUTH THREE TIMES DAILY AS NEEDED FOR ANXIETY (DO  NOT  TAKE  WITH  LORAZEPAM) 270 tablet 1  ? doxepin (SINEQUAN) 50 MG capsule Take 3 capsules (150 mg total) by mouth at bedtime. 270 capsule 3  ? estradiol (ESTRACE) 0.1 MG/GM vaginal cream INSERT 1 APPLICATORFUL VAGINALLY 3 TIMES PER WEEK 43 g 3  ? estradiol cypionate (DEPO-ESTRADIOL) 5 MG/ML injection Use 0.3 ml every 18-25 days IM 5 mL 3  ? furosemide (LASIX) 40 MG tablet Take 1-2 tablets (40-80 mg total) by mouth daily. 180 tablet 3  ? levothyroxine (SYNTHROID) 137 MCG tablet TAKE 1 TABLET BY MOUTH ONCE DAILY BEFORE BREAKFAST every other day and alternate with one half every other day 90 tablet 3  ? LORazepam (ATIVAN) 0.5 MG tablet Take 1 tablet (0.5 mg total) by mouth at bedtime. 90 tablet 1  ? metoprolol tartrate (LOPRESSOR) 50 MG tablet Take 1 tablet (50 mg total) by mouth 3 (three) times daily. 270 tablet 3  ? mupirocin ointment (BACTROBAN) 2 % On leg wound w/dressing change qd or bid 30 g 0  ? mupirocin ointment (BACTROBAN) 2 % APPLY OINTMENT TOPICALLY TO LEG WOUND WITH DRESSING CHANGE ONCE OR TWICE A DAY 22 g 0  ? NIFEdipine (PROCARDIA-XL/NIFEDICAL-XL) 30 MG 24 hr tablet Take 1 tablet (30 mg total) by mouth daily. 90 tablet 3  ? nitroGLYCERIN  (NITROLINGUAL) 0.4 MG/SPRAY spray PLACE 1 SPRAY UNDER THE TONGUE EVERY 5 MINUTES AS NEEDED FOR CHEST PAIN. MAY REPEAT 3 TIMES 12 g 1  ? potassium chloride SA (KLOR-CON M20) 20 MEQ tablet Take 1 tablet (20 mEq total) by mouth daily. 90 tablet 3  ? ?No facility-administered medications prior to visit.  ? ? ?ROS: ?Review of Systems  ?Constitutional:  Positive for diaphoresis. Negative for activity change, appetite change, chills, fatigue and unexpected weight change.  ?HENT:  Negative for congestion, mouth sores and sinus pressure.   ?Eyes:  Negative for visual disturbance.  ?Respiratory:  Negative for cough and chest tightness.   ?Gastrointestinal:  Negative for abdominal pain and nausea.  ?Genitourinary:  Negative for difficulty urinating, frequency and vaginal pain.  ?Musculoskeletal:  Positive for arthralgias. Negative for back pain and gait problem.  ?Skin:  Negative for pallor and rash.  ?Neurological:  Negative for dizziness, tremors, weakness, numbness and headaches.  ?Psychiatric/Behavioral:  Negative for confusion and sleep disturbance. The patient is nervous/anxious.   ? ?Objective:  ?BP 124/64   Pulse 62   Ht 5\' 8"  (1.727 m)   Wt 137 lb 12.8 oz (62.5 kg)   SpO2 92%   BMI 20.95 kg/m?  ? ?BP Readings from Last 3 Encounters:  ?09/28/21 124/64  ?08/31/21 (!) 168/86  ?  08/03/21 (!) 180/80  ? ? ?Wt Readings from Last 3 Encounters:  ?09/28/21 137 lb 12.8 oz (62.5 kg)  ?08/31/21 132 lb (59.9 kg)  ?08/03/21 136 lb (61.7 kg)  ? ? ?Physical Exam ?Constitutional:   ?   General: She is not in acute distress. ?   Appearance: Normal appearance. She is well-developed.  ?HENT:  ?   Head: Normocephalic.  ?   Right Ear: External ear normal.  ?   Left Ear: External ear normal.  ?   Nose: Nose normal.  ?Eyes:  ?   General:     ?   Right eye: No discharge.     ?   Left eye: No discharge.  ?   Conjunctiva/sclera: Conjunctivae normal.  ?   Pupils: Pupils are equal, round, and reactive to light.  ?Neck:  ?   Thyroid: No  thyromegaly.  ?   Vascular: No JVD.  ?   Trachea: No tracheal deviation.  ?Cardiovascular:  ?   Rate and Rhythm: Normal rate and regular rhythm.  ?   Heart sounds: Normal heart sounds.  ?Pulmonary:  ?   Effort: No respiratory distress.  ?   Breath sounds: No stridor. No wheezing.  ?Abdominal:  ?   General: Bowel sounds are normal. There is no distension.  ?   Palpations: Abdomen is soft. There is no mass.  ?   Tenderness: There is no abdominal tenderness. There is no guarding or rebound.  ?Musculoskeletal:     ?   General: No tenderness.  ?   Cervical back: Normal range of motion and neck supple. No rigidity.  ?Lymphadenopathy:  ?   Cervical: No cervical adenopathy.  ?Skin: ?   Findings: No erythema or rash.  ?Neurological:  ?   Cranial Nerves: No cranial nerve deficit.  ?   Motor: No abnormal muscle tone.  ?   Coordination: Coordination normal.  ?   Deep Tendon Reflexes: Reflexes normal.  ?Psychiatric:     ?   Behavior: Behavior normal.     ?   Thought Content: Thought content normal.     ?   Judgment: Judgment normal.  ? ? ?Lab Results  ?Component Value Date  ? WBC 8.3 03/02/2021  ? HGB 11.5 (L) 03/02/2021  ? HCT 34.7 (L) 03/02/2021  ? PLT 282.0 03/02/2021  ? GLUCOSE 50 (L) 07/31/2021  ? CHOL 235 (H) 12/29/2019  ? TRIG 100 12/29/2019  ? HDL 69 12/29/2019  ? LDLDIRECT 188.2 01/10/2012  ? LDLCALC 145 (H) 12/29/2019  ? ALT 10 07/31/2021  ? AST 16 07/31/2021  ? NA 141 07/31/2021  ? K 3.7 07/31/2021  ? CL 102 07/31/2021  ? CREATININE 1.27 (H) 07/31/2021  ? BUN 18 07/31/2021  ? CO2 29 07/31/2021  ? TSH <0.01 (L) 03/02/2021  ? INR 1.0 08/01/2018  ? ? ?No results found. ? ?Assessment & Plan:  ? ?Problem List Items Addressed This Visit   ? ? Depression  ?  Cont w/Doxepin at hs ?  ?  ? Essential hypertension  ?  Stable. The best we can do ?  ?  ? Hot flash, menopausal  ?  Symptomatic menopause. She would like to cont Depo-Estradiol 0.3 ml ? Potential benefits of a long term HRT  use as well as potential risks  and  complications were explained to the patient and were aknowledged. ?2/20 Discussed cutting back on Rx or weaning off ? ?  ?  ? CRI (chronic renal insufficiency), stage  3 (moderate) ([place])  ?  Hydrate well ?Continue to monitor GFR ? ?  ?  ?  ? ? ?No orders of the defined types were placed in this encounter. ?  ? ? ?Follow-up: Return in about 4 weeks (around 10/26/2021) for a follow-up visit. ? ?Walker Kehr, MD ?

## 2021-09-28 NOTE — Assessment & Plan Note (Signed)
Symptomatic menopause. She would like to cont Depo-Estradiol 0.3 ml ? Potential benefits of a long term HRT  use as well as potential risks  and complications were explained to the patient and were aknowledged. ?2/20 Discussed cutting back on Rx or weaning off ?

## 2021-09-28 NOTE — Assessment & Plan Note (Signed)
Cont w/Doxepin at hs ?

## 2021-09-28 NOTE — Assessment & Plan Note (Signed)
Stable. The best we can do ?

## 2021-09-28 NOTE — Addendum Note (Signed)
Addended by: Delsa Grana R on: 09/28/2021 03:15 PM ? ? Modules accepted: Orders ? ?

## 2021-10-26 ENCOUNTER — Encounter: Payer: Self-pay | Admitting: Internal Medicine

## 2021-10-26 ENCOUNTER — Ambulatory Visit (INDEPENDENT_AMBULATORY_CARE_PROVIDER_SITE_OTHER): Payer: Medicare Other | Admitting: Internal Medicine

## 2021-10-26 VITALS — BP 158/90 | HR 67 | Temp 98.7°F | Ht 68.0 in | Wt 137.0 lb

## 2021-10-26 DIAGNOSIS — M79671 Pain in right foot: Secondary | ICD-10-CM

## 2021-10-26 DIAGNOSIS — Z79890 Hormone replacement therapy (postmenopausal): Secondary | ICD-10-CM | POA: Diagnosis not present

## 2021-10-26 DIAGNOSIS — M25561 Pain in right knee: Secondary | ICD-10-CM

## 2021-10-26 DIAGNOSIS — I2510 Atherosclerotic heart disease of native coronary artery without angina pectoris: Secondary | ICD-10-CM | POA: Diagnosis not present

## 2021-10-26 DIAGNOSIS — I10 Essential (primary) hypertension: Secondary | ICD-10-CM

## 2021-10-26 DIAGNOSIS — M25461 Effusion, right knee: Secondary | ICD-10-CM | POA: Diagnosis not present

## 2021-10-26 DIAGNOSIS — F329 Major depressive disorder, single episode, unspecified: Secondary | ICD-10-CM

## 2021-10-26 DIAGNOSIS — N951 Menopausal and female climacteric states: Secondary | ICD-10-CM

## 2021-10-26 MED ORDER — NITROGLYCERIN 0.4 MG/SPRAY TL SOLN: 2 refills | Status: DC

## 2021-10-26 MED ORDER — MEDROXYPROGESTERONE ACETATE 150 MG/ML IM SUSP
150.0000 mg | Freq: Once | INTRAMUSCULAR | Status: AC
  Administered 2021-10-26: 150 mg via INTRAMUSCULAR

## 2021-10-26 NOTE — Progress Notes (Signed)
Subjective:  Patient ID: [name], female    DOB: 06/07/1945  Age: 76 y.o. MRN: [id number]  CC: No chief complaint on file.   HPI [name] presents for sweats, CAD, HTN  Outpatient Medications Prior to Visit  Medication Sig Dispense Refill   aspirin 81 MG EC tablet Take 81 mg by mouth daily.     Cholecalciferol 1000 UNITS tablet Take 1,000 Units by mouth daily.     clobetasol ointment (TEMOVATE) 0.05 % Apply 1 application. topically 2 (two) times daily as needed. 30 g 3   cloNIDine (CATAPRES) 0.1 MG tablet Take 1 tablet (0.1 mg total) by mouth 3 (three) times daily as needed (take for BP>180/110). 90 tablet 3   clorazepate (TRANXENE) 15 MG tablet TAKE 1 TABLET BY MOUTH THREE TIMES DAILY AS NEEDED FOR ANXIETY (DO  NOT  TAKE  WITH  LORAZEPAM) 270 tablet 1   doxepin (SINEQUAN) 50 MG capsule Take 3 capsules (150 mg total) by mouth at bedtime. 270 capsule 3   estradiol (ESTRACE) 0.1 MG/GM vaginal cream INSERT 1 APPLICATORFUL VAGINALLY 3 TIMES PER WEEK 43 g 3   estradiol cypionate (DEPO-ESTRADIOL) 5 MG/ML injection Use 0.3 ml every 18-25 days IM 5 mL 3   furosemide (LASIX) 40 MG tablet Take 1-2 tablets (40-80 mg total) by mouth daily. 180 tablet 3   levothyroxine (SYNTHROID) 137 MCG tablet TAKE 1 TABLET BY MOUTH ONCE DAILY BEFORE BREAKFAST every other day and alternate with one half every other day 90 tablet 3   LORazepam (ATIVAN) 0.5 MG tablet Take 1 tablet (0.5 mg total) by mouth at bedtime. 90 tablet 1   metoprolol tartrate (LOPRESSOR) 50 MG tablet Take 1 tablet (50 mg total) by mouth 3 (three) times daily. 270 tablet 3   mupirocin ointment (BACTROBAN) 2 % On leg wound w/dressing change qd or bid 30 g 0   mupirocin ointment (BACTROBAN) 2 % APPLY OINTMENT TOPICALLY TO LEG WOUND WITH DRESSING CHANGE ONCE OR TWICE A DAY 22 g 0   NIFEdipine (PROCARDIA-XL/NIFEDICAL-XL) 30 MG 24 hr tablet Take 1 tablet (30 mg total) by mouth daily. 90 tablet 3   potassium chloride SA (KLOR-CON M20) 20 MEQ  tablet Take 1 tablet (20 mEq total) by mouth daily. 90 tablet 3   nitroGLYCERIN (NITROLINGUAL) 0.4 MG/SPRAY spray PLACE 1 SPRAY UNDER THE TONGUE EVERY 5 MINUTES AS NEEDED FOR CHEST PAIN. MAY REPEAT 3 TIMES 12 g 1   No facility-administered medications prior to visit.    ROS: Review of Systems  Constitutional:  Negative for activity change, appetite change, chills, fatigue and unexpected weight change.  HENT:  Negative for congestion, mouth sores and sinus pressure.   Eyes:  Negative for visual disturbance.  Respiratory:  Negative for cough and chest tightness.   Gastrointestinal:  Negative for abdominal pain and nausea.  Genitourinary:  Negative for difficulty urinating, frequency and vaginal pain.  Musculoskeletal:  Positive for arthralgias. Negative for back pain and gait problem.  Skin:  Negative for pallor and rash.  Neurological:  Negative for dizziness, tremors, weakness, numbness and headaches.  Psychiatric/Behavioral:  Negative for confusion, sleep disturbance and suicidal ideas. The patient is nervous/anxious.     Objective:  BP (!) 158/90 (BP Location: Left Arm, Patient Position: Sitting, Cuff Size: Normal)   Pulse 67   Temp 98.7 F (37.1 C) (Oral)   Ht 5\' 8"  (1.727 m)   Wt 137 lb (62.1 kg)   SpO2 99%   BMI 20.83 kg/m  BP Readings from Last 3 Encounters:  10/26/21 (!) 158/90  09/28/21 124/64  08/31/21 (!) 168/86    Wt Readings from Last 3 Encounters:  10/26/21 137 lb (62.1 kg)  09/28/21 137 lb 12.8 oz (62.5 kg)  08/31/21 132 lb (59.9 kg)    Physical Exam Constitutional:      General: She is not in acute distress.    Appearance: Normal appearance. She is well-developed.  HENT:     Head: Normocephalic.     Right Ear: External ear normal.     Left Ear: External ear normal.     Nose: Nose normal.  Eyes:     General:        Right eye: No discharge.        Left eye: No discharge.     Conjunctiva/sclera: Conjunctivae normal.     Pupils: Pupils are equal,  round, and reactive to light.  Neck:     Thyroid: No thyromegaly.     Vascular: No JVD.     Trachea: No tracheal deviation.  Cardiovascular:     Rate and Rhythm: Normal rate and regular rhythm.     Heart sounds: Normal heart sounds.  Pulmonary:     Effort: No respiratory distress.     Breath sounds: No stridor. No wheezing.  Abdominal:     General: Bowel sounds are normal. There is no distension.     Palpations: Abdomen is soft. There is no mass.     Tenderness: There is no abdominal tenderness. There is no guarding or rebound.  Musculoskeletal:        General: No tenderness.     Cervical back: Normal range of motion and neck supple. No rigidity.  Lymphadenopathy:     Cervical: No cervical adenopathy.  Skin:    Findings: No erythema or rash.  Neurological:     Cranial Nerves: No cranial nerve deficit.     Motor: No abnormal muscle tone.     Coordination: Coordination normal.     Deep Tendon Reflexes: Reflexes normal.  Psychiatric:        Behavior: Behavior normal.        Thought Content: Thought content normal.        Judgment: Judgment normal.     Lab Results  Component Value Date   WBC 8.3 03/02/2021   HGB 11.5 (L) 03/02/2021   HCT 34.7 (L) 03/02/2021   PLT 282.0 03/02/2021   GLUCOSE 50 (L) 07/31/2021   CHOL 235 (H) 12/29/2019   TRIG 100 12/29/2019   HDL 69 12/29/2019   LDLDIRECT 188.2 01/10/2012   LDLCALC 145 (H) 12/29/2019   ALT 10 07/31/2021   AST 16 07/31/2021   NA 141 07/31/2021   K 3.7 07/31/2021   CL 102 07/31/2021   CREATININE 1.27 (H) 07/31/2021   BUN 18 07/31/2021   CO2 29 07/31/2021   TSH <0.01 (L) 03/02/2021   INR 1.0 08/01/2018    No results found.  Assessment & Plan:   Problem List Items Addressed This Visit     Coronary atherosclerosis    Nitrospray was renewed to use prn      Relevant Medications   nitroGLYCERIN (NITROLINGUAL) 0.4 MG/SPRAY spray   Depression    Cont w/Doxepin at hs      Essential hypertension    Continue  w/Toprol, Nifedipine, Furosemide 80 mg q am BP ok at home       Relevant Medications   nitroGLYCERIN (NITROLINGUAL) 0.4 MG/SPRAY spray   Hot flash,  menopausal    Symptomatic menopause. She would like to cont Depo-Estradiol 0.3 ml  Potential benefits of a long term HRT  use as well as potential risks  and complications were explained to the patient and were aknowledged.       Relevant Medications   nitroGLYCERIN (NITROLINGUAL) 0.4 MG/SPRAY spray   Pain and swelling of right knee    Blue-Emu cream --use 2-3 times a day      Right foot pain    Blue-Emu cream --use 2-3 times a day         Meds ordered this encounter  Medications   nitroGLYCERIN (NITROLINGUAL) 0.4 MG/SPRAY spray    Sig: PLACE 1 SPRAY UNDER THE TONGUE EVERY 5 MINUTES AS NEEDED FOR CHEST PAIN. MAY REPEAT 3 TIMES    Dispense:  4.9 g    Refill:  2    Please consider 90 day supplies to promote better adherence      Follow-up: Return in about 4 weeks (around 11/23/2021) for a follow-up visit.  Sonda Primes, MD

## 2021-10-26 NOTE — Assessment & Plan Note (Signed)
Continue w/Toprol, Nifedipine, Furosemide 80 mg q am BP ok at home  

## 2021-10-26 NOTE — Assessment & Plan Note (Signed)
Blue-Emu cream -- use 2-3 times a day ? ?

## 2021-10-26 NOTE — Assessment & Plan Note (Signed)
Symptomatic menopause. She would like to cont Depo-Estradiol 0.3 ml  Potential benefits of a long term HRT  use as well as potential risks  and complications were explained to the patient and were aknowledged.

## 2021-10-26 NOTE — Assessment & Plan Note (Signed)
Nitrospray was renewed to use prn

## 2021-10-26 NOTE — Patient Instructions (Signed)
Blue-Emu cream -- use 2-3 times a day ? ?

## 2021-10-26 NOTE — Assessment & Plan Note (Signed)
Cont w/Doxepin at hs ?

## 2021-10-26 NOTE — Addendum Note (Signed)
Addended by: Delsa Grana R on: 10/26/2021 04:20 PM   Modules accepted: Orders

## 2021-11-23 ENCOUNTER — Encounter: Payer: Self-pay | Admitting: Internal Medicine

## 2021-11-23 ENCOUNTER — Ambulatory Visit (INDEPENDENT_AMBULATORY_CARE_PROVIDER_SITE_OTHER): Payer: Medicare Other | Admitting: Internal Medicine

## 2021-11-23 DIAGNOSIS — F419 Anxiety disorder, unspecified: Secondary | ICD-10-CM

## 2021-11-23 DIAGNOSIS — I10 Essential (primary) hypertension: Secondary | ICD-10-CM | POA: Diagnosis not present

## 2021-11-23 DIAGNOSIS — M10072 Idiopathic gout, left ankle and foot: Secondary | ICD-10-CM

## 2021-11-23 DIAGNOSIS — Z79890 Hormone replacement therapy (postmenopausal): Secondary | ICD-10-CM | POA: Diagnosis not present

## 2021-11-23 MED ORDER — MEDROXYPROGESTERONE ACETATE 150 MG/ML IM SUSP
150.0000 mg | Freq: Once | INTRAMUSCULAR | Status: AC
  Administered 2021-11-23: 150 mg via INTRAMUSCULAR

## 2021-11-23 NOTE — Assessment & Plan Note (Addendum)
Tranzene - rare use Tashaya got a new dog Charlie 60 mo old - very happy

## 2021-11-23 NOTE — Progress Notes (Signed)
Subjective:  Patient ID: [name], female    DOB: 12-23-1945  Age: 76 y.o. MRN: [id number]  CC: No chief complaint on file.   HPI [name] presents for hot flashes/HRT, anxiety, HTN [name] got a new dog Charlie 31 mo old  Outpatient Medications Prior to Visit  Medication Sig Dispense Refill   aspirin 81 MG EC tablet Take 81 mg by mouth daily.     Cholecalciferol 1000 UNITS tablet Take 1,000 Units by mouth daily.     clobetasol ointment (TEMOVATE) 0.05 % Apply 1 application. topically 2 (two) times daily as needed. 30 g 3   cloNIDine (CATAPRES) 0.1 MG tablet Take 1 tablet (0.1 mg total) by mouth 3 (three) times daily as needed (take for BP>180/110). 90 tablet 3   clorazepate (TRANXENE) 15 MG tablet TAKE 1 TABLET BY MOUTH THREE TIMES DAILY AS NEEDED FOR ANXIETY (DO  NOT  TAKE  WITH  LORAZEPAM) 270 tablet 1   doxepin (SINEQUAN) 50 MG capsule Take 3 capsules (150 mg total) by mouth at bedtime. 270 capsule 3   estradiol (ESTRACE) 0.1 MG/GM vaginal cream INSERT 1 APPLICATORFUL VAGINALLY 3 TIMES PER WEEK 43 g 3   estradiol cypionate (DEPO-ESTRADIOL) 5 MG/ML injection Use 0.3 ml every 18-25 days IM 5 mL 3   furosemide (LASIX) 40 MG tablet Take 1-2 tablets (40-80 mg total) by mouth daily. 180 tablet 3   levothyroxine (SYNTHROID) 137 MCG tablet TAKE 1 TABLET BY MOUTH ONCE DAILY BEFORE BREAKFAST every other day and alternate with one half every other day 90 tablet 3   LORazepam (ATIVAN) 0.5 MG tablet Take 1 tablet (0.5 mg total) by mouth at bedtime. 90 tablet 1   metoprolol tartrate (LOPRESSOR) 50 MG tablet Take 1 tablet (50 mg total) by mouth 3 (three) times daily. 270 tablet 3   mupirocin ointment (BACTROBAN) 2 % On leg wound w/dressing change qd or bid 30 g 0   mupirocin ointment (BACTROBAN) 2 % APPLY OINTMENT TOPICALLY TO LEG WOUND WITH DRESSING CHANGE ONCE OR TWICE A DAY 22 g 0   NIFEdipine (PROCARDIA-XL/NIFEDICAL-XL) 30 MG 24 hr tablet Take 1 tablet (30 mg total) by mouth daily. 90 tablet  3   nitroGLYCERIN (NITROLINGUAL) 0.4 MG/SPRAY spray PLACE 1 SPRAY UNDER THE TONGUE EVERY 5 MINUTES AS NEEDED FOR CHEST PAIN. MAY REPEAT 3 TIMES 4.9 g 2   potassium chloride SA (KLOR-CON M20) 20 MEQ tablet Take 1 tablet (20 mEq total) by mouth daily. 90 tablet 3   No facility-administered medications prior to visit.    ROS: Review of Systems  Constitutional:  Positive for diaphoresis. Negative for activity change, appetite change, chills, fatigue and unexpected weight change.  HENT:  Negative for congestion, mouth sores and sinus pressure.   Eyes:  Negative for visual disturbance.  Respiratory:  Negative for cough and chest tightness.   Gastrointestinal:  Negative for abdominal pain and nausea.  Genitourinary:  Negative for difficulty urinating, frequency and vaginal pain.  Musculoskeletal:  Negative for back pain and gait problem.  Skin:  Negative for pallor and rash.  Neurological:  Negative for dizziness, tremors, weakness, numbness and headaches.  Psychiatric/Behavioral:  Negative for confusion, sleep disturbance and suicidal ideas. The patient is nervous/anxious.     Objective:  Ht 5\' 8"  (1.727 m)   BMI 20.83 kg/m   BP Readings from Last 3 Encounters:  10/26/21 (!) 158/90  09/28/21 124/64  08/31/21 (!) 168/86    Wt Readings from Last 3 Encounters:  10/26/21 137 lb (62.1 kg)  09/28/21 137 lb 12.8 oz (62.5 kg)  08/31/21 132 lb (59.9 kg)    Physical Exam Constitutional:      General: She is not in acute distress.    Appearance: Normal appearance. She is well-developed.  HENT:     Head: Normocephalic.     Right Ear: External ear normal.     Left Ear: External ear normal.     Nose: Nose normal.  Eyes:     General:        Right eye: No discharge.        Left eye: No discharge.     Conjunctiva/sclera: Conjunctivae normal.     Pupils: Pupils are equal, round, and reactive to light.  Neck:     Thyroid: No thyromegaly.     Vascular: No JVD.     Trachea: No tracheal  deviation.  Cardiovascular:     Rate and Rhythm: Normal rate and regular rhythm.     Heart sounds: Normal heart sounds.  Pulmonary:     Effort: No respiratory distress.     Breath sounds: No stridor. No wheezing.  Abdominal:     General: Bowel sounds are normal. There is no distension.     Palpations: Abdomen is soft. There is no mass.     Tenderness: There is no abdominal tenderness. There is no guarding or rebound.  Musculoskeletal:        General: Tenderness present.     Cervical back: Normal range of motion and neck supple. No rigidity.  Lymphadenopathy:     Cervical: No cervical adenopathy.  Skin:    Findings: No erythema or rash.  Neurological:     Mental Status: She is oriented to person, place, and time.     Cranial Nerves: No cranial nerve deficit.     Motor: No abnormal muscle tone.     Coordination: Coordination normal.     Deep Tendon Reflexes: Reflexes normal.  Psychiatric:        Behavior: Behavior normal.        Thought Content: Thought content normal.        Judgment: Judgment normal.     Lab Results  Component Value Date   WBC 8.3 03/02/2021   HGB 11.5 (L) 03/02/2021   HCT 34.7 (L) 03/02/2021   PLT 282.0 03/02/2021   GLUCOSE 50 (L) 07/31/2021   CHOL 235 (H) 12/29/2019   TRIG 100 12/29/2019   HDL 69 12/29/2019   LDLDIRECT 188.2 01/10/2012   LDLCALC 145 (H) 12/29/2019   ALT 10 07/31/2021   AST 16 07/31/2021   NA 141 07/31/2021   K 3.7 07/31/2021   CL 102 07/31/2021   CREATININE 1.27 (H) 07/31/2021   BUN 18 07/31/2021   CO2 29 07/31/2021   TSH <0.01 (L) 03/02/2021   INR 1.0 08/01/2018    No results found.  Assessment & Plan:   Problem List Items Addressed This Visit     Anxiety disorder    Tranzene - rare use [name] got a new dog Charlie 31 mo old - very happy      Essential hypertension    Stable. The best we can do. Continue w/Toprol, Nifedipine, Furosemide 80 mg q am BP ok at home      Gout    No relapse      Hormone  replacement therapy (HRT)    Continue w/Depo-Estradiol 0.3 ml IM monthly  Potential benefits of a long term HRT  use as  well as potential risks  and complications were explained to the patient and were aknowledged. Mammogr q 12 mo DVT prophylaxis discussed         No orders of the defined types were placed in this encounter.     Follow-up: No follow-ups on file.  Sonda Primes, MD

## 2021-11-23 NOTE — Assessment & Plan Note (Signed)
No relapse 

## 2021-11-23 NOTE — Addendum Note (Signed)
Addended by: Delsa Grana R on: 11/23/2021 04:02 PM   Modules accepted: Orders

## 2021-11-23 NOTE — Assessment & Plan Note (Signed)
Stable. The best we can do. Continue w/Toprol, Nifedipine, Furosemide 80 mg q am BP ok at home

## 2021-11-23 NOTE — Assessment & Plan Note (Signed)
Continue w/Depo-Estradiol 0.3 ml IM monthly  Potential benefits of a long term HRT  use as well as potential risks  and complications were explained to the patient and were aknowledged. Mammogr q 12 mo DVT prophylaxis discussed

## 2021-12-16 DIAGNOSIS — H3562 Retinal hemorrhage, left eye: Secondary | ICD-10-CM | POA: Diagnosis not present

## 2021-12-16 DIAGNOSIS — H43393 Other vitreous opacities, bilateral: Secondary | ICD-10-CM | POA: Diagnosis not present

## 2021-12-19 DIAGNOSIS — H02833 Dermatochalasis of right eye, unspecified eyelid: Secondary | ICD-10-CM | POA: Diagnosis not present

## 2021-12-19 DIAGNOSIS — H3561 Retinal hemorrhage, right eye: Secondary | ICD-10-CM | POA: Diagnosis not present

## 2021-12-19 DIAGNOSIS — H353221 Exudative age-related macular degeneration, left eye, with active choroidal neovascularization: Secondary | ICD-10-CM | POA: Diagnosis not present

## 2021-12-21 ENCOUNTER — Ambulatory Visit (INDEPENDENT_AMBULATORY_CARE_PROVIDER_SITE_OTHER): Payer: Medicare Other | Admitting: Internal Medicine

## 2021-12-21 ENCOUNTER — Encounter: Payer: Self-pay | Admitting: Internal Medicine

## 2021-12-21 VITALS — BP 170/80 | HR 92 | Temp 98.4°F | Ht 68.0 in

## 2021-12-21 DIAGNOSIS — H353120 Nonexudative age-related macular degeneration, left eye, stage unspecified: Secondary | ICD-10-CM | POA: Diagnosis not present

## 2021-12-21 DIAGNOSIS — F419 Anxiety disorder, unspecified: Secondary | ICD-10-CM

## 2021-12-21 DIAGNOSIS — Z79890 Hormone replacement therapy (postmenopausal): Secondary | ICD-10-CM | POA: Diagnosis not present

## 2021-12-21 DIAGNOSIS — M10072 Idiopathic gout, left ankle and foot: Secondary | ICD-10-CM

## 2021-12-21 DIAGNOSIS — I10 Essential (primary) hypertension: Secondary | ICD-10-CM | POA: Diagnosis not present

## 2021-12-21 DIAGNOSIS — H3530 Unspecified macular degeneration: Secondary | ICD-10-CM | POA: Insufficient documentation

## 2021-12-21 MED ORDER — MEDROXYPROGESTERONE ACETATE 150 MG/ML IM SUSP
150.0000 mg | Freq: Once | INTRAMUSCULAR | Status: AC
  Administered 2021-12-21: 150 mg via INTRAMUSCULAR

## 2021-12-21 NOTE — Assessment & Plan Note (Signed)
Continue w/Toprol, Nifedipine, Furosemide 80 mg q am BP ok at home

## 2021-12-21 NOTE — Assessment & Plan Note (Signed)
New macular degeneration L eye w/vision loss - seeing Dr Jenene Slicker.

## 2021-12-21 NOTE — Assessment & Plan Note (Signed)
[name] has a new dog [name] 73 mo old - very happy

## 2021-12-21 NOTE — Assessment & Plan Note (Signed)
Continue w/Depo-Estradiol 0.3 ml IM monthly  Potential benefits of a long term HRT  use as well as potential risks  and complications were explained to the patient and were aknowledged. Premarin cream

## 2021-12-21 NOTE — Progress Notes (Signed)
Subjective:  Patient ID: [name], female    DOB: [birth year]  Age: 76 y.o. MRN: [id number]  CC: No chief complaint on file.   HPI [name] presents for macular degeneration L eye - seeing Dr Jenene Slicker  Outpatient Medications Prior to Visit  Medication Sig Dispense Refill   aspirin 81 MG EC tablet Take 81 mg by mouth daily.     Cholecalciferol 1000 UNITS tablet Take 1,000 Units by mouth daily.     clobetasol ointment (TEMOVATE) 0.05 % Apply 1 application. topically 2 (two) times daily as needed. 30 g 3   cloNIDine (CATAPRES) 0.1 MG tablet Take 1 tablet (0.1 mg total) by mouth 3 (three) times daily as needed (take for BP>180/110). 90 tablet 3   clorazepate (TRANXENE) 15 MG tablet TAKE 1 TABLET BY MOUTH THREE TIMES DAILY AS NEEDED FOR ANXIETY (DO  NOT  TAKE  WITH  LORAZEPAM) 270 tablet 1   doxepin (SINEQUAN) 50 MG capsule Take 3 capsules (150 mg total) by mouth at bedtime. 270 capsule 3   estradiol (ESTRACE) 0.1 MG/GM vaginal cream INSERT 1 APPLICATORFUL VAGINALLY 3 TIMES PER WEEK 43 g 3   estradiol cypionate (DEPO-ESTRADIOL) 5 MG/ML injection Use 0.3 ml every 18-25 days IM 5 mL 3   furosemide (LASIX) 40 MG tablet Take 1-2 tablets (40-80 mg total) by mouth daily. 180 tablet 3   levothyroxine (SYNTHROID) 137 MCG tablet TAKE 1 TABLET BY MOUTH ONCE DAILY BEFORE BREAKFAST every other day and alternate with one half every other day 90 tablet 3   LORazepam (ATIVAN) 0.5 MG tablet Take 1 tablet (0.5 mg total) by mouth at bedtime. 90 tablet 1   metoprolol tartrate (LOPRESSOR) 50 MG tablet Take 1 tablet (50 mg total) by mouth 3 (three) times daily. 270 tablet 3   mupirocin ointment (BACTROBAN) 2 % On leg wound w/dressing change qd or bid 30 g 0   mupirocin ointment (BACTROBAN) 2 % APPLY OINTMENT TOPICALLY TO LEG WOUND WITH DRESSING CHANGE ONCE OR TWICE A DAY 22 g 0   NIFEdipine (PROCARDIA-XL/NIFEDICAL-XL) 30 MG 24 hr tablet Take 1 tablet (30 mg total) by mouth daily. 90 tablet 3   nitroGLYCERIN  (NITROLINGUAL) 0.4 MG/SPRAY spray PLACE 1 SPRAY UNDER THE TONGUE EVERY 5 MINUTES AS NEEDED FOR CHEST PAIN. MAY REPEAT 3 TIMES 4.9 g 2   potassium chloride SA (KLOR-CON M20) 20 MEQ tablet Take 1 tablet (20 mEq total) by mouth daily. 90 tablet 3   No facility-administered medications prior to visit.    ROS: Review of Systems  Constitutional:  Negative for activity change, appetite change, chills, fatigue and unexpected weight change.  HENT:  Negative for congestion, mouth sores and sinus pressure.   Eyes:  Positive for visual disturbance. Negative for pain.  Respiratory:  Negative for cough and chest tightness.   Gastrointestinal:  Negative for abdominal pain and nausea.  Genitourinary:  Negative for difficulty urinating, frequency and vaginal pain.  Musculoskeletal:  Positive for arthralgias. Negative for back pain and gait problem.  Skin:  Negative for pallor and rash.  Neurological:  Negative for dizziness, tremors, weakness, numbness and headaches.  Psychiatric/Behavioral:  Negative for confusion, sleep disturbance and suicidal ideas. The patient is nervous/anxious.     Objective:  BP (!) 170/80 (BP Location: Left Arm, Patient Position: Sitting, Cuff Size: Normal)   Pulse 92   Temp 98.4 F (36.9 C) (Oral)   Ht 5\' 8"  (1.727 m)   SpO2 99%   BMI 20.53 kg/m  BP Readings from Last 3 Encounters:  12/21/21 (!) 170/80  11/23/21 120/86  10/26/21 (!) 158/90    Wt Readings from Last 3 Encounters:  11/23/21 135 lb (61.2 kg)  10/26/21 137 lb (62.1 kg)  09/28/21 137 lb 12.8 oz (62.5 kg)    Physical Exam Constitutional:      General: She is not in acute distress.    Appearance: Normal appearance. She is well-developed.  HENT:     Head: Normocephalic.     Right Ear: External ear normal.     Left Ear: External ear normal.     Nose: Nose normal.  Eyes:     General:        Right eye: No discharge.        Left eye: No discharge.     Conjunctiva/sclera: Conjunctivae normal.      Pupils: Pupils are equal, round, and reactive to light.  Neck:     Thyroid: No thyromegaly.     Vascular: No JVD.     Trachea: No tracheal deviation.  Cardiovascular:     Rate and Rhythm: Normal rate and regular rhythm.     Heart sounds: Normal heart sounds.  Pulmonary:     Effort: No respiratory distress.     Breath sounds: No stridor. No wheezing.  Abdominal:     General: Bowel sounds are normal. There is no distension.     Palpations: Abdomen is soft. There is no mass.     Tenderness: There is no abdominal tenderness. There is no guarding or rebound.  Musculoskeletal:        General: No tenderness.     Cervical back: Normal range of motion and neck supple. No rigidity.     Right lower leg: Edema present.     Left lower leg: Edema present.  Lymphadenopathy:     Cervical: No cervical adenopathy.  Skin:    Findings: No erythema or rash.  Neurological:     Mental Status: She is oriented to person, place, and time.     Cranial Nerves: No cranial nerve deficit.     Motor: No abnormal muscle tone.     Coordination: Coordination normal.     Deep Tendon Reflexes: Reflexes normal.  Psychiatric:        Behavior: Behavior normal.        Thought Content: Thought content normal.        Judgment: Judgment normal.     Lab Results  Component Value Date   WBC 8.3 03/02/2021   HGB 11.5 (L) 03/02/2021   HCT 34.7 (L) 03/02/2021   PLT 282.0 03/02/2021   GLUCOSE 50 (L) 07/31/2021   CHOL 235 (H) 12/29/2019   TRIG 100 12/29/2019   HDL 69 12/29/2019   LDLDIRECT 188.2 01/10/2012   LDLCALC 145 (H) 12/29/2019   ALT 10 07/31/2021   AST 16 07/31/2021   NA 141 07/31/2021   K 3.7 07/31/2021   CL 102 07/31/2021   CREATININE 1.27 (H) 07/31/2021   BUN 18 07/31/2021   CO2 29 07/31/2021   TSH <0.01 (L) 03/02/2021   INR 1.0 08/01/2018    No results found.  Assessment & Plan:   Problem List Items Addressed This Visit     Anxiety disorder     [name] has a new dog Charlie 30 mo old - very  happy      Essential hypertension    Continue w/Toprol, Nifedipine, Furosemide 80 mg q am BP ok at home  Gout    No relapse      Hormone replacement therapy (HRT) - Primary    Continue w/Depo-Estradiol 0.3 ml IM monthly  Potential benefits of a long term HRT  use as well as potential risks  and complications were explained to the patient and were aknowledged. Premarin cream       Relevant Medications   medroxyPROGESTERone (DEPO-PROVERA) injection 150 mg   Macular degeneration of left eye    New macular degeneration L eye w/vision loss - seeing Dr Jenene Slicker.          Meds ordered this encounter  Medications   medroxyPROGESTERone (DEPO-PROVERA) injection 150 mg      Follow-up: Return in about 4 weeks (around 01/18/2022) for a follow-up visit.  Sonda Primes, MD

## 2021-12-21 NOTE — Assessment & Plan Note (Signed)
No relapse 

## 2022-01-17 DIAGNOSIS — H353221 Exudative age-related macular degeneration, left eye, with active choroidal neovascularization: Secondary | ICD-10-CM | POA: Diagnosis not present

## 2022-01-18 ENCOUNTER — Encounter: Payer: Self-pay | Admitting: Internal Medicine

## 2022-01-18 ENCOUNTER — Ambulatory Visit (INDEPENDENT_AMBULATORY_CARE_PROVIDER_SITE_OTHER): Payer: Medicare Other | Admitting: Internal Medicine

## 2022-01-18 VITALS — BP 182/88 | HR 73 | Temp 98.1°F | Ht 68.0 in | Wt 138.6 lb

## 2022-01-18 DIAGNOSIS — N951 Menopausal and female climacteric states: Secondary | ICD-10-CM

## 2022-01-18 DIAGNOSIS — H353120 Nonexudative age-related macular degeneration, left eye, stage unspecified: Secondary | ICD-10-CM

## 2022-01-18 DIAGNOSIS — I10 Essential (primary) hypertension: Secondary | ICD-10-CM

## 2022-01-18 DIAGNOSIS — Z79890 Hormone replacement therapy (postmenopausal): Secondary | ICD-10-CM

## 2022-01-18 DIAGNOSIS — N183 Chronic kidney disease, stage 3 unspecified: Secondary | ICD-10-CM

## 2022-01-18 MED ORDER — ESTRADIOL CYPIONATE 5 MG/ML IM OIL
5.0000 mg | TOPICAL_OIL | Freq: Once | INTRAMUSCULAR | Status: AC
  Administered 2022-01-18: 5 mg via INTRAMUSCULAR

## 2022-01-18 NOTE — Assessment & Plan Note (Signed)
Stable. The best we can do. Continue w/Toprol, Nifedipine, Furosemide 80 mg q am BP ok at home Clonidine prn for SBP>190 BP Readings from Last 3 Encounters:  01/18/22 (!) 182/88  12/21/21 (!) 170/80  11/23/21 120/86

## 2022-01-18 NOTE — Assessment & Plan Note (Signed)
S/p a painful L eye injection yesterday L eye focal erythema 3 mm at 6 o'clock

## 2022-01-18 NOTE — Assessment & Plan Note (Signed)
On inj q 1 mo

## 2022-01-18 NOTE — Assessment & Plan Note (Signed)
Continue to monitor GFR

## 2022-01-18 NOTE — Assessment & Plan Note (Signed)
Continue w/Depo-Estradiol 0.3 ml IM monthly  Potential benefits of a long term HRT  use as well as potential risks  and complications were explained to the patient and were aknowledged. 

## 2022-01-18 NOTE — Progress Notes (Signed)
Subjective:  Patient ID: [name], female    DOB: 1945-12-06  Age: 76 y.o. MRN: [id number]  CC: Follow-up (4 week f/u)   HPI Molina C Murie presents for HRT inj. C/o a painful L eye injection yesterday F/u on gout, HTN  Outpatient Medications Prior to Visit  Medication Sig Dispense Refill   aspirin 81 MG EC tablet Take 81 mg by mouth daily.     Cholecalciferol 1000 UNITS tablet Take 1,000 Units by mouth daily.     clobetasol ointment (TEMOVATE) 0.05 % Apply 1 application. topically 2 (two) times daily as needed. 30 g 3   cloNIDine (CATAPRES) 0.1 MG tablet Take 1 tablet (0.1 mg total) by mouth 3 (three) times daily as needed (take for BP>180/110). 90 tablet 3   clorazepate (TRANXENE) 15 MG tablet TAKE 1 TABLET BY MOUTH THREE TIMES DAILY AS NEEDED FOR ANXIETY (DO  NOT  TAKE  WITH  LORAZEPAM) 270 tablet 1   doxepin (SINEQUAN) 50 MG capsule Take 3 capsules (150 mg total) by mouth at bedtime. 270 capsule 3   estradiol (ESTRACE) 0.1 MG/GM vaginal cream INSERT 1 APPLICATORFUL VAGINALLY 3 TIMES PER WEEK 43 g 3   estradiol cypionate (DEPO-ESTRADIOL) 5 MG/ML injection Use 0.3 ml every 18-25 days IM 5 mL 3   furosemide (LASIX) 40 MG tablet Take 1-2 tablets (40-80 mg total) by mouth daily. 180 tablet 3   levothyroxine (SYNTHROID) 137 MCG tablet TAKE 1 TABLET BY MOUTH ONCE DAILY BEFORE BREAKFAST every other day and alternate with one half every other day 90 tablet 3   LORazepam (ATIVAN) 0.5 MG tablet Take 1 tablet (0.5 mg total) by mouth at bedtime. 90 tablet 1   metoprolol tartrate (LOPRESSOR) 50 MG tablet Take 1 tablet (50 mg total) by mouth 3 (three) times daily. 270 tablet 3   mupirocin ointment (BACTROBAN) 2 % On leg wound w/dressing change qd or bid 30 g 0   mupirocin ointment (BACTROBAN) 2 % APPLY OINTMENT TOPICALLY TO LEG WOUND WITH DRESSING CHANGE ONCE OR TWICE A DAY 22 g 0   NIFEdipine (PROCARDIA-XL/NIFEDICAL-XL) 30 MG 24 hr tablet Take 1 tablet (30 mg total) by mouth daily. 90 tablet 3    nitroGLYCERIN (NITROLINGUAL) 0.4 MG/SPRAY spray PLACE 1 SPRAY UNDER THE TONGUE EVERY 5 MINUTES AS NEEDED FOR CHEST PAIN. MAY REPEAT 3 TIMES 4.9 g 2   potassium chloride SA (KLOR-CON M20) 20 MEQ tablet Take 1 tablet (20 mEq total) by mouth daily. 90 tablet 3   No facility-administered medications prior to visit.    ROS: Review of Systems  Constitutional:  Negative for activity change, appetite change, chills, fatigue and unexpected weight change.  HENT:  Negative for congestion, mouth sores and sinus pressure.   Eyes:  Positive for pain and visual disturbance.  Respiratory:  Negative for cough and chest tightness.   Gastrointestinal:  Negative for abdominal pain and nausea.  Genitourinary:  Negative for difficulty urinating, frequency and vaginal pain.  Musculoskeletal:  Positive for arthralgias. Negative for back pain and gait problem.  Skin:  Negative for pallor and rash.  Neurological:  Negative for dizziness, tremors, weakness, numbness and headaches.  Psychiatric/Behavioral:  Positive for sleep disturbance. Negative for confusion and suicidal ideas. The patient is nervous/anxious.     Objective:  BP (!) 182/88 (BP Location: Left Arm)   Pulse 73   Temp 98.1 F (36.7 C) (Oral)   Ht 5\' 8"  (1.727 m)   Wt 138 lb 9.6 oz (62.9 kg)  SpO2 97%   BMI 21.07 kg/m   BP Readings from Last 3 Encounters:  01/18/22 (!) 182/88  12/21/21 (!) 170/80  11/23/21 120/86    Wt Readings from Last 3 Encounters:  01/18/22 138 lb 9.6 oz (62.9 kg)  11/23/21 135 lb (61.2 kg)  10/26/21 137 lb (62.1 kg)    Physical Exam Constitutional:      General: She is not in acute distress.    Appearance: Normal appearance. She is well-developed.  HENT:     Head: Normocephalic.     Right Ear: External ear normal.     Left Ear: External ear normal.     Nose: Nose normal.  Eyes:     General:        Right eye: No discharge.        Left eye: No discharge.     Conjunctiva/sclera: Conjunctivae normal.      Pupils: Pupils are equal, round, and reactive to light.  Neck:     Thyroid: No thyromegaly.     Vascular: No JVD.     Trachea: No tracheal deviation.  Cardiovascular:     Rate and Rhythm: Normal rate and regular rhythm.     Heart sounds: Normal heart sounds.  Pulmonary:     Effort: No respiratory distress.     Breath sounds: No stridor. No wheezing.  Abdominal:     General: Bowel sounds are normal. There is no distension.     Palpations: Abdomen is soft. There is no mass.     Tenderness: There is no abdominal tenderness. There is no guarding or rebound.  Musculoskeletal:        General: No tenderness.     Cervical back: Normal range of motion and neck supple. No rigidity.  Lymphadenopathy:     Cervical: No cervical adenopathy.  Skin:    Findings: No erythema or rash.  Neurological:     Mental Status: She is oriented to person, place, and time.     Cranial Nerves: No cranial nerve deficit.     Motor: No abnormal muscle tone.     Coordination: Coordination normal.     Deep Tendon Reflexes: Reflexes normal.  Psychiatric:        Behavior: Behavior normal.        Thought Content: Thought content normal.        Judgment: Judgment normal.   L eye focal erythema 3 mm at 6 o'clock  Lab Results  Component Value Date   WBC 8.3 03/02/2021   HGB 11.5 (L) 03/02/2021   HCT 34.7 (L) 03/02/2021   PLT 282.0 03/02/2021   GLUCOSE 50 (L) 07/31/2021   CHOL 235 (H) 12/29/2019   TRIG 100 12/29/2019   HDL 69 12/29/2019   LDLDIRECT 188.2 01/10/2012   LDLCALC 145 (H) 12/29/2019   ALT 10 07/31/2021   AST 16 07/31/2021   NA 141 07/31/2021   K 3.7 07/31/2021   CL 102 07/31/2021   CREATININE 1.27 (H) 07/31/2021   BUN 18 07/31/2021   CO2 29 07/31/2021   TSH <0.01 (L) 03/02/2021   INR 1.0 08/01/2018    No results found.  Assessment & Plan:   Problem List Items Addressed This Visit     CRI (chronic renal insufficiency), stage 3 (moderate) (HCC)    Continue to monitor GFR       Essential hypertension    Stable. The best we can do. Continue w/Toprol, Nifedipine, Furosemide 80 mg q am BP ok at home Clonidine prn  for SBP>190 BP Readings from Last 3 Encounters:  01/18/22 (!) 182/88  12/21/21 (!) 170/80  11/23/21 120/86         Hormone replacement therapy (HRT) - Primary    Continue w/Depo-Estradiol 0.3 ml IM monthly  Potential benefits of a long term HRT  use as well as potential risks  and complications were explained to the patient and were aknowledged.      Hot flash, menopausal    On inj q 1 mo      Macular degeneration of left eye    S/p a painful L eye injection yesterday L eye focal erythema 3 mm at 6 o'clock         Meds ordered this encounter  Medications   estradiol cypionate (DEPO-ESTRADIOL) 5 MG/ML injection 5 mg      Follow-up: Return in about 4 weeks (around 02/15/2022) for a follow-up visit.  Sonda Primes, MD

## 2022-02-13 DIAGNOSIS — H43393 Other vitreous opacities, bilateral: Secondary | ICD-10-CM | POA: Diagnosis not present

## 2022-02-14 DIAGNOSIS — H353221 Exudative age-related macular degeneration, left eye, with active choroidal neovascularization: Secondary | ICD-10-CM | POA: Diagnosis not present

## 2022-02-15 ENCOUNTER — Ambulatory Visit (INDEPENDENT_AMBULATORY_CARE_PROVIDER_SITE_OTHER): Payer: Medicare Other | Admitting: Internal Medicine

## 2022-02-15 ENCOUNTER — Encounter: Payer: Self-pay | Admitting: Internal Medicine

## 2022-02-15 VITALS — BP 182/102 | HR 70 | Ht 68.0 in | Wt 139.0 lb

## 2022-02-15 DIAGNOSIS — F419 Anxiety disorder, unspecified: Secondary | ICD-10-CM

## 2022-02-15 DIAGNOSIS — Z79890 Hormone replacement therapy (postmenopausal): Secondary | ICD-10-CM | POA: Diagnosis not present

## 2022-02-15 MED ORDER — ESTRADIOL CYPIONATE 5 MG/ML IM OIL
5.0000 mg | TOPICAL_OIL | Freq: Once | INTRAMUSCULAR | Status: AC
  Administered 2022-02-15: 5 mg via INTRAMUSCULAR

## 2022-02-15 NOTE — Assessment & Plan Note (Signed)
Continue w/Depo-Estradiol 0.3 ml IM monthly  Potential benefits of a long term HRT  use as well as potential risks  and complications were explained to the patient and were aknowledged.

## 2022-02-15 NOTE — Assessment & Plan Note (Signed)
Doing better.   

## 2022-02-15 NOTE — Progress Notes (Signed)
Subjective:  Patient ID: [name], female    DOB: 1945/05/27  Age: 76 y.o. MRN: [id number]  CC: Follow-up (injection)   HPI Julicia C Kerwood presents for hot flashes, anxiety  Outpatient Medications Prior to Visit  Medication Sig Dispense Refill   aspirin 81 MG EC tablet Take 81 mg by mouth daily.     Cholecalciferol 1000 UNITS tablet Take 1,000 Units by mouth daily.     clobetasol ointment (TEMOVATE) 2.42 % Apply 1 application. topically 2 (two) times daily as needed. 30 g 3   cloNIDine (CATAPRES) 0.1 MG tablet Take 1 tablet (0.1 mg total) by mouth 3 (three) times daily as needed (take for BP>180/110). 90 tablet 3   clorazepate (TRANXENE) 15 MG tablet TAKE 1 TABLET BY MOUTH THREE TIMES DAILY AS NEEDED FOR ANXIETY (DO  NOT  TAKE  WITH  LORAZEPAM) 270 tablet 1   doxepin (SINEQUAN) 50 MG capsule Take 3 capsules (150 mg total) by mouth at bedtime. 270 capsule 3   estradiol (ESTRACE) 0.1 MG/GM vaginal cream INSERT 1 APPLICATORFUL VAGINALLY 3 TIMES PER WEEK 43 g 3   estradiol cypionate (DEPO-ESTRADIOL) 5 MG/ML injection Use 0.3 ml every 18-25 days IM 5 mL 3   furosemide (LASIX) 40 MG tablet Take 1-2 tablets (40-80 mg total) by mouth daily. 180 tablet 3   levothyroxine (SYNTHROID) 137 MCG tablet TAKE 1 TABLET BY MOUTH ONCE DAILY BEFORE BREAKFAST every other day and alternate with one half every other day 90 tablet 3   LORazepam (ATIVAN) 0.5 MG tablet Take 1 tablet (0.5 mg total) by mouth at bedtime. 90 tablet 1   metoprolol tartrate (LOPRESSOR) 50 MG tablet Take 1 tablet (50 mg total) by mouth 3 (three) times daily. 270 tablet 3   mupirocin ointment (BACTROBAN) 2 % On leg wound w/dressing change qd or bid 30 g 0   mupirocin ointment (BACTROBAN) 2 % APPLY OINTMENT TOPICALLY TO LEG WOUND WITH DRESSING CHANGE ONCE OR TWICE A DAY 22 g 0   NIFEdipine (PROCARDIA-XL/NIFEDICAL-XL) 30 MG 24 hr tablet Take 1 tablet (30 mg total) by mouth daily. 90 tablet 3   nitroGLYCERIN (NITROLINGUAL) 0.4 MG/SPRAY spray  PLACE 1 SPRAY UNDER THE TONGUE EVERY 5 MINUTES AS NEEDED FOR CHEST PAIN. MAY REPEAT 3 TIMES 4.9 g 2   potassium chloride SA (KLOR-CON M20) 20 MEQ tablet Take 1 tablet (20 mEq total) by mouth daily. 90 tablet 3   No facility-administered medications prior to visit.    ROS: Review of Systems  Constitutional:  Negative for activity change, appetite change, chills, fatigue and unexpected weight change.  HENT:  Negative for congestion, mouth sores and sinus pressure.   Eyes:  Positive for visual disturbance.  Respiratory:  Negative for cough and chest tightness.   Gastrointestinal:  Negative for abdominal pain and nausea.  Genitourinary:  Negative for difficulty urinating, frequency and vaginal pain.  Musculoskeletal:  Negative for back pain and gait problem.  Skin:  Negative for pallor and rash.  Neurological:  Negative for dizziness, tremors, weakness, numbness and headaches.  Psychiatric/Behavioral:  Negative for confusion and sleep disturbance. The patient is nervous/anxious.     Objective:  BP (!) 182/102   Pulse 70   Ht 5\' 8"  (1.727 m)   Wt 139 lb (63 kg)   SpO2 97%   BMI 21.13 kg/m   BP Readings from Last 3 Encounters:  02/15/22 (!) 182/102  01/18/22 (!) 182/88  12/21/21 (!) 170/80    Wt Readings from Last  3 Encounters:  02/15/22 139 lb (63 kg)  01/18/22 138 lb 9.6 oz (62.9 kg)  11/23/21 135 lb (61.2 kg)    Physical Exam Constitutional:      General: She is not in acute distress.    Appearance: She is well-developed.  HENT:     Head: Normocephalic.     Right Ear: External ear normal.     Left Ear: External ear normal.     Nose: Nose normal.  Eyes:     General:        Right eye: No discharge.        Left eye: No discharge.     Conjunctiva/sclera: Conjunctivae normal.     Pupils: Pupils are equal, round, and reactive to light.  Neck:     Thyroid: No thyromegaly.     Vascular: No JVD.     Trachea: No tracheal deviation.  Cardiovascular:     Rate and Rhythm:  Normal rate and regular rhythm.     Heart sounds: Normal heart sounds.  Pulmonary:     Effort: No respiratory distress.     Breath sounds: No stridor. No wheezing.  Abdominal:     General: Bowel sounds are normal. There is no distension.     Palpations: Abdomen is soft. There is no mass.     Tenderness: There is no abdominal tenderness. There is no guarding or rebound.  Musculoskeletal:        General: No tenderness.     Cervical back: Normal range of motion and neck supple. No rigidity.  Lymphadenopathy:     Cervical: No cervical adenopathy.  Skin:    Findings: No erythema or rash.  Neurological:     Cranial Nerves: No cranial nerve deficit.     Motor: No abnormal muscle tone.     Coordination: Coordination normal.     Deep Tendon Reflexes: Reflexes normal.  Psychiatric:        Behavior: Behavior normal.        Thought Content: Thought content normal.        Judgment: Judgment normal.     Lab Results  Component Value Date   WBC 8.3 03/02/2021   HGB 11.5 (L) 03/02/2021   HCT 34.7 (L) 03/02/2021   PLT 282.0 03/02/2021   GLUCOSE 50 (L) 07/31/2021   CHOL 235 (H) 12/29/2019   TRIG 100 12/29/2019   HDL 69 12/29/2019   LDLDIRECT 188.2 01/10/2012   LDLCALC 145 (H) 12/29/2019   ALT 10 07/31/2021   AST 16 07/31/2021   NA 141 07/31/2021   K 3.7 07/31/2021   CL 102 07/31/2021   CREATININE 1.27 (H) 07/31/2021   BUN 18 07/31/2021   CO2 29 07/31/2021   TSH <0.01 (L) 03/02/2021   INR 1.0 08/01/2018    No results found.  Assessment & Plan:   Problem List Items Addressed This Visit     Anxiety disorder    Doing better      Hormone replacement therapy (HRT) - Primary    Continue w/Depo-Estradiol 0.3 ml IM monthly  Potential benefits of a long term HRT  use as well as potential risks  and complications were explained to the patient and were aknowledged.         Meds ordered this encounter  Medications   estradiol cypionate (DEPO-ESTRADIOL) 5 MG/ML injection 5 mg       Follow-up: Return in about 4 weeks (around 03/15/2022) for a follow-up visit.  Sonda Primes, MD

## 2022-02-21 ENCOUNTER — Other Ambulatory Visit: Payer: Self-pay | Admitting: Internal Medicine

## 2022-03-12 NOTE — Progress Notes (Signed)
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 female with the above problem list.  She was last seen by Dr. Burt Knack in 03/2019. She returns for evaluation of BP issues.  She is here alone.  She was diagnosed with macular degeneration in July.  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  She has been started on Avastin injections with ophthalmology.  She has had difficulty controlling her blood pressure in the past but since starting on these injections, her pressures have been in the 170s and 180s.  She does not have any symptoms with this.  She has not had chest pain, shortness of breath, orthopnea, syncope.  She does have some ankle edema.  She takes furosemide.        Past Medical History:  Diagnosis Date   Anxiety state, unspecified    Bronchitis, acute    Carotid bruit    Coronary atherosclerosis of unspecified type of vessel, native or graft    multiple angioplasy procedures in 90's/early 2000's   De Quervain's tenosynovitis    Depressive disorder, not elsewhere classified    Female bladder prolapse    Lichen [id number]   Vaginal   Other and unspecified hyperlipidemia    Unspecified essential hypertension     Unspecified hypothyroidism    Current Medications: Current Meds  Medication Sig   aspirin 81 MG EC tablet Take 81 mg by mouth daily.   Cholecalciferol 1000 UNITS tablet Take 1,000 Units by mouth daily.   clobetasol ointment (TEMOVATE) [id number] % Apply 1 application. topically 2 (two) times daily as needed.   clorazepate (TRANXENE) 15 MG tablet TAKE 1 TABLET BY MOUTH THREE TIMES DAILY AS NEEDED FOR ANXIETY (DO  NOT  TAKE  WITH  LORAZEPAM)   doxepin (SINEQUAN) 50 MG capsule Take 3 capsules (150 mg total) by mouth at bedtime.   estradiol (ESTRACE) 0.1 MG/GM vaginal cream INSERT 1 APPLICATORFUL VAGINALLY 3 TIMES PER WEEK   estradiol cypionate (DEPO-ESTRADIOL) 5 MG/ML injection Use 0.3 ml every 18-25 days IM   furosemide (LASIX) 40 MG tablet Take 1-2 tablets (40-80 mg total) by mouth daily.   levothyroxine (SYNTHROID) 137 MCG tablet TAKE 1 TABLET BY MOUTH ONCE DAILY BEFORE BREAKFAST every other day and alternate with one half every other day   LORazepam (ATIVAN) 0.5 MG tablet TAKE 1 TABLET BY MOUTH AT BEDTIME   metoprolol tartrate (LOPRESSOR) 50 MG tablet Take 1 tablet (50 mg total) by mouth 3 (three) times daily.   NIFEdipine (PROCARDIA XL) 60 MG 24 hr tablet Take 1 tablet (60 mg total) by mouth daily.   nitroGLYCERIN (NITROLINGUAL) 0.4 MG/SPRAY spray  PLACE 1 SPRAY UNDER THE TONGUE EVERY 5 MINUTES AS NEEDED FOR CHEST PAIN. MAY REPEAT 3 TIMES   potassium chloride SA (KLOR-CON M20) 20 MEQ tablet Take 1 tablet (20 mEq total) by mouth daily.   [DISCONTINUED] cloNIDine (CATAPRES) 0.1 MG tablet Take 1 tablet (0.1 mg total) by mouth 3 (three) times daily as needed (take for BP>180/110).   [DISCONTINUED] NIFEdipine (PROCARDIA-XL/NIFEDICAL-XL) 30 MG 24 hr tablet Take 1 tablet (30 mg total) by mouth daily.    Allergies:   Epinephrine, Isosorbide mononitrate, Lidocaine, Morphine, Propoxyphene n-acetaminophen, Alprazolam, Atorvastatin, Codeine, Ezetimibe, Prilosec [omeprazole], Rosuvastatin, Simvastatin,  Allopurinol, Meloxicam, Metronidazole, Tetracycline, Triple antibiotic [bacitracin-neomycin-polymyxin], Amoxicillin, Ciprofloxacin, and Diphenhydramine hcl   Social History   Tobacco Use   Smoking status: Former    Types: Cigarettes    Quit date: 07/22/1991    Years since quitting: 30.6   Smokeless tobacco: Never  Vaping Use   Vaping Use: Never used  Substance Use Topics   Alcohol use: No   Drug use: No    Family Hx: The patient's family history includes Coronary artery disease in an other family member; Hypertension in her father, mother, and another family member.  Review of Systems  Gastrointestinal:  Positive for constipation.     EKGs/Labs/Other Test Reviewed:    EKG:  EKG is  ordered today.  The ekg ordered today demonstrates NSR, HR 72, LVH, no ST-T wave changes, QTc 424  Recent Labs: 07/31/2021: ALT 10; BUN 18; Creatinine, Ser 1.27; Potassium 3.7; Sodium 141   Recent Lipid Panel No results for input(s): "CHOL", "TRIG", "HDL", "VLDL", "LDLCALC", "LDLDIRECT" in the last 8760 hours.   Risk Assessment/Calculations/Metrics:         HYPERTENSION CONTROL Vitals:   03/13/22 1001 03/13/22 1746  BP: (!) 170/62 (!) 200/90    The patient's blood pressure is elevated above target today.  In order to address the patient's elevated BP: A current anti-hypertensive medication was adjusted today.; Blood pressure will be monitored at home to determine if medication changes need to be made.       Physical Exam:    VS:  BP (!) 200/90   Pulse 72   Ht 5\' 8"  (1.727 m)   Wt 142 lb (64.4 kg)   SpO2 98%   BMI 21.59 kg/m     Wt Readings from Last 3 Encounters:  03/13/22 142 lb (64.4 kg)  02/15/22 139 lb (63 kg)  01/18/22 138 lb 9.6 oz (62.9 kg)    Constitutional:      Appearance: Healthy appearance. Not in distress.  Neck:     Vascular: No carotid bruit. JVD normal.  Pulmonary:     Effort: Pulmonary effort is normal.     Breath sounds: No wheezing. No rales.   Cardiovascular:     Normal rate. Regular rhythm. Normal S1. Normal S2.      Murmurs: There is no murmur.  Edema:    Peripheral edema present.    Ankle: bilateral trace edema of the ankle. Abdominal:     Palpations: Abdomen is soft.  Skin:    General: Skin is warm and dry.  Neurological:     Mental Status: Alert and oriented to person, place and time.          ASSESSMENT & PLAN:   Essential hypertension Blood pressure is markedly uncontrolled.  She notes that her blood pressure started increasing after she started on Avastin injections for her macular degeneration.  I reviewed this medication and there is a high  incidence of elevated BP with Avastin.  However, I suspect that this is more likely the case when Avastin is used systemically for other conditions.  I am not sure if intraocular injections is associated with elevated blood pressures.  I have asked her to discuss this with her ophthalmologist.  She has had some sensitivities to other medications in the past.  She never did get clonidine filled. Refill clonidine 0.1 mg every 8 hours as needed for extreme blood pressure increase Increase nifedipine to 60 mg daily Obtain BMET, magnesium, TSH Monitor blood pressure at home and send readings in 1 week If blood pressure still above target, consider changing Lopressor to carvedilol 12.5 mg twice daily At follow-up, consider spironolactone versus ACE/ARB If blood pressure continues to be difficult to control, consider renal artery ultrasound, renin/aldosterone ratio Follow-up 2 weeks  Coronary artery disease involving native coronary artery of native heart without angina pectoris History of remote PCI to the LAD.  Cardiac catheterization in 2011 with mild nonobstructive disease.  She is not having anginal symptoms.  She is intolerant of all lipid-lowering therapies.  Continue metoprolol 50 mg 3 times a day.  Continue aspirin 81 mg daily.  Stage 3a chronic kidney disease ([place]) Obtain  follow-up BMET today.  If blood pressure remains difficult to control, consider renal artery ultrasound.            Dispo:  Return in about 2 weeks (around 03/27/2022) for Routine Follow Up, w/ Richardson Dopp, PA-C.   Medication Adjustments/Labs and Tests Ordered: Current medicines are reviewed at length with the patient today.  Concerns regarding medicines are outlined above.  Tests Ordered: Orders Placed This Encounter  Procedures   TSH   Basic metabolic panel   Magnesium   EKG 12-Lead   Medication Changes: Meds ordered this encounter  Medications   NIFEdipine (PROCARDIA XL) 60 MG 24 hr tablet    Sig: Take 1 tablet (60 mg total) by mouth daily.    Dispense:  90 tablet    Refill:  3   cloNIDine (CATAPRES) 0.1 MG tablet    Sig: Take 1 tablet (0.1 mg total) by mouth 3 (three) times daily as needed (take for BP>180/110).    Dispense:  90 tablet    Refill:  3   Signed, Richardson Dopp, PA-C  03/13/2022 5:55 PM    [address], [place], [place]  [address] Phone: [phone]; Fax: [phone]

## 2022-03-13 ENCOUNTER — Ambulatory Visit: Payer: Medicare Other | Attending: Physician Assistant | Admitting: Physician Assistant

## 2022-03-13 ENCOUNTER — Encounter: Payer: Self-pay | Admitting: Physician Assistant

## 2022-03-13 VITALS — BP 200/90 | HR 72 | Ht 68.0 in | Wt 142.0 lb

## 2022-03-13 DIAGNOSIS — I2510 Atherosclerotic heart disease of native coronary artery without angina pectoris: Secondary | ICD-10-CM | POA: Diagnosis not present

## 2022-03-13 DIAGNOSIS — N1831 Chronic kidney disease, stage 3a: Secondary | ICD-10-CM | POA: Diagnosis not present

## 2022-03-13 DIAGNOSIS — I10 Essential (primary) hypertension: Secondary | ICD-10-CM

## 2022-03-13 MED ORDER — CLONIDINE HCL 0.1 MG PO TABS: 0.1000 mg | ORAL_TABLET | Freq: Three times a day (TID) | ORAL | 3 refills | Status: DC | PRN

## 2022-03-13 MED ORDER — NIFEDIPINE ER OSMOTIC RELEASE 60 MG PO TB24: 60.0000 mg | ORAL_TABLET | Freq: Every day | ORAL | 3 refills | Status: DC

## 2022-03-13 NOTE — Patient Instructions (Addendum)
Medication Instructions:  Your physician has recommended you make the following change in your medication:   INCREASE the Nifedipine to 60 mg taking 1 daily.  You may take 2 of the 30 mg tablets at one time to use them up  USE the Clonidine as needed   *If you need a refill on your cardiac medications before your next appointment, please call your pharmacy*   Lab Work: TODAY:  BMET, TSH, & MAG  If you have labs (blood work) drawn today and your tests are completely normal, you will receive your results only by: [place] [facility] (if you have MyChart) OR A paper copy in the mail If you have any lab test that is abnormal or we need to change your treatment, we will call you to review the results.   Testing/Procedures: None ordered   Follow-Up: At [facility], you and your health needs are our priority.  As part of our continuing mission to provide you with exceptional heart care, we have created designated Provider Care Teams.  These Care Teams include your primary Cardiologist (physician) and Advanced Practice Providers (APPs -  Physician Assistants and Nurse Practitioners) who all work together to provide you with the care you need, when you need it.  We recommend signing up for the patient portal called "MyChart".  Sign up information is provided on this After Visit Summary.  MyChart is used to connect with patients for Virtual Visits (Telemedicine).  Patients are able to view lab/test results, encounter notes, upcoming appointments, etc.  Non-urgent messages can be sent to your provider as well.   To learn more about what you can do with MyChart, go to [email or web address].    Your next appointment:   03/27/22 ARRIVE AT 9:50  The format for your next appointment:   In Person  Provider:    Richardson Dopp, PA-C         Other Instructions Your physician has requested that you regularly monitor and record your blood pressure readings at home. Please use the same machine at  the same time of day to check your readings and record them to bring to your follow-up visit.   Please monitor blood pressures and keep a log of your readings for 1 week and send a mychart message with these readings.    Make sure to check 2 hours after your medications.    AVOID these things for 30 minutes before checking your blood pressure: No Drinking caffeine. No Drinking alcohol. No Eating. No Smoking. No Exercising.   Five minutes before checking your blood pressure: Pee. Sit in a dining chair. Avoid sitting in a soft couch or armchair. Be quiet. Do not talk   Important Information About Sugar

## 2022-03-13 NOTE — Assessment & Plan Note (Signed)
History of remote PCI to the LAD.  Cardiac catheterization in 2011 with mild nonobstructive disease.  She is not having anginal symptoms.  She is intolerant of all lipid-lowering therapies.  Continue metoprolol 50 mg 3 times a day.  Continue aspirin 81 mg daily.

## 2022-03-13 NOTE — Assessment & Plan Note (Signed)
Obtain follow-up BMET today.  If blood pressure remains difficult to control, consider renal artery ultrasound.

## 2022-03-13 NOTE — Assessment & Plan Note (Signed)
Blood pressure is markedly uncontrolled.  She notes that her blood pressure started increasing after she started on Avastin injections for her macular degeneration.  I reviewed this medication and there is a high incidence of elevated BP with Avastin.  However, I suspect that this is more likely the case when Avastin is used systemically for other conditions.  I am not sure if intraocular injections is associated with elevated blood pressures.  I have asked her to discuss this with her ophthalmologist.  She has had some sensitivities to other medications in the past.  She never did get clonidine filled. Refill clonidine 0.1 mg every 8 hours as needed for extreme blood pressure increase Increase nifedipine to 60 mg daily Obtain BMET, magnesium, TSH Monitor blood pressure at home and send readings in 1 week If blood pressure still above target, consider changing Lopressor to carvedilol 12.5 mg twice daily At follow-up, consider spironolactone versus ACE/ARB If blood pressure continues to be difficult to control, consider renal artery ultrasound, renin/aldosterone ratio Follow-up 2 weeks

## 2022-03-14 ENCOUNTER — Telehealth: Payer: Self-pay | Admitting: *Deleted

## 2022-03-14 LAB — BASIC METABOLIC PANEL
BUN/Creatinine Ratio: 12 (ref 12–28)
BUN: 15 mg/dL (ref 8–27)
CO2: 26 mmol/L (ref 20–29)
Calcium: 9.5 mg/dL (ref 8.7–10.3)
Chloride: 103 mmol/L (ref 96–106)
Creatinine, Ser: 1.21 mg/dL — ABNORMAL HIGH (ref 0.57–1.00)
Glucose: 85 mg/dL (ref 70–99)
Potassium: 4.2 mmol/L (ref 3.5–5.2)
Sodium: 144 mmol/L (ref 134–144)
eGFR: 46 mL/min/{1.73_m2} — ABNORMAL LOW (ref >59)

## 2022-03-14 LAB — MAGNESIUM: Magnesium: 2 mg/dL (ref 1.6–2.3)

## 2022-03-14 LAB — TSH: TSH: <0.005 u[IU]/mL — ABNORMAL LOW (ref 0.450–4.500)

## 2022-03-14 NOTE — Telephone Encounter (Signed)
Placed call to pt regarding mychart message.  Per pt, after she sent the message, she took 3 rolaids and started burping and passing gas and then the pain went away.  After discussion, pt agreed to continue with the plan at the office visit yesterday, increasing the Nifedipine to 60 mg daily.  Pt will let us know what happens after todays dose.

## 2022-03-14 NOTE — Telephone Encounter (Signed)
Please call the patient. See if she is still having chest pain. If she is having active chest pain, she should go to the ED. Her BP was very high yesterday.  If she is not having chest pain >> I think we have 2 options - She can do whichever one she is more comfortable with  Option 1: -Continue Nifedipine 30 mg once daily -DC Metoprolol  -Start Carvedilol 12.5 mg twice daily.  Option 2: -Continue Nifedipine 30 mg once daily -Continue Metoprolol tartrate 50 mg three times a day -Start taking the Clonidine 0.1 mg twice daily every day (12 hours apart) - Make sure she knows that if she has been taking Clonidine for a while she cannot stop it suddenly, she has to taper off  Richardson Dopp, PA-C    03/14/2022 8:29 AM

## 2022-03-20 ENCOUNTER — Encounter: Payer: Self-pay | Admitting: Internal Medicine

## 2022-03-20 ENCOUNTER — Ambulatory Visit (INDEPENDENT_AMBULATORY_CARE_PROVIDER_SITE_OTHER): Payer: Medicare Other | Admitting: Internal Medicine

## 2022-03-20 VITALS — BP 200/105 | HR 65 | Temp 98.1°F | Ht 68.0 in | Wt 139.2 lb

## 2022-03-20 DIAGNOSIS — I10 Essential (primary) hypertension: Secondary | ICD-10-CM | POA: Diagnosis not present

## 2022-03-20 DIAGNOSIS — N951 Menopausal and female climacteric states: Secondary | ICD-10-CM | POA: Diagnosis not present

## 2022-03-20 DIAGNOSIS — Z79890 Hormone replacement therapy (postmenopausal): Secondary | ICD-10-CM

## 2022-03-20 DIAGNOSIS — E034 Atrophy of thyroid (acquired): Secondary | ICD-10-CM

## 2022-03-20 MED ORDER — CLONIDINE HCL 0.1 MG PO TABS: 0.1000 mg | ORAL_TABLET | Freq: Three times a day (TID) | ORAL | 3 refills | Status: DC | PRN

## 2022-03-20 MED ORDER — ESTRADIOL CYPIONATE 5 MG/ML IM OIL
5.0000 mg | TOPICAL_OIL | Freq: Once | INTRAMUSCULAR | Status: AC
  Administered 2022-03-20: 5 mg via INTRAMUSCULAR

## 2022-03-20 MED ORDER — LEVOTHYROXINE SODIUM 100 MCG PO TABS: ORAL_TABLET | ORAL | 3 refills | Status: DC

## 2022-03-20 NOTE — Patient Instructions (Addendum)
Stop Levothyroxine 137 mcg for 5 days. Start Levothyroxine 100 mcg every other day and 50 mcg every other day.

## 2022-03-20 NOTE — Assessment & Plan Note (Signed)
I suggested to d/c HRT due to HTN. Pt states she started it for heart health - by Dr Wille Glaser Stop Levothyroxine 137 mcg for 5 days. Start Levothyroxine 100 mcg every other day and 50 mcg every other day.

## 2022-03-20 NOTE — Assessment & Plan Note (Signed)
Elevated FT4 Stop Levothyroxine 137 mcg for 5 days. Start Levothyroxine 100 mcg every other day and 50 mcg every other day.

## 2022-03-20 NOTE — Assessment & Plan Note (Signed)
I suggested to d/c HRT. Pt states she started it for heart health - by Dr Wille Glaser Stop Levothyroxine 137 mcg for 5 days. Start Levothyroxine 100 mcg every other day and 50 mcg every other day.

## 2022-03-20 NOTE — Progress Notes (Signed)
Subjective:  Patient ID: [name], female    DOB: 1946-05-09  Age: 76 y.o. MRN: [id number]  CC: Follow-up (4 week f/u)   HPI [name] presents for elevated BP, elevated FT4, HRT. On Procardia XL now  Outpatient Medications Prior to Visit  Medication Sig Dispense Refill   aspirin 81 MG EC tablet Take 81 mg by mouth daily.     Cholecalciferol 1000 UNITS tablet Take 1,000 Units by mouth daily.     clobetasol ointment (TEMOVATE) 6.78 % Apply 1 application. topically 2 (two) times daily as needed. 30 g 3   cloNIDine (CATAPRES) 0.1 MG tablet Take 1 tablet (0.1 mg total) by mouth 3 (three) times daily as needed (take for BP>180/110). 90 tablet 3   clorazepate (TRANXENE) 15 MG tablet TAKE 1 TABLET BY MOUTH THREE TIMES DAILY AS NEEDED FOR ANXIETY (DO  NOT  TAKE  WITH  LORAZEPAM) 270 tablet 1   doxepin (SINEQUAN) 50 MG capsule Take 3 capsules (150 mg total) by mouth at bedtime. 270 capsule 3   estradiol (ESTRACE) 0.1 MG/GM vaginal cream INSERT 1 APPLICATORFUL VAGINALLY 3 TIMES PER WEEK 43 g 3   estradiol cypionate (DEPO-ESTRADIOL) 5 MG/ML injection Use 0.3 ml every 18-25 days IM 5 mL 3   furosemide (LASIX) 40 MG tablet Take 1-2 tablets (40-80 mg total) by mouth daily. 180 tablet 3   LORazepam (ATIVAN) 0.5 MG tablet TAKE 1 TABLET BY MOUTH AT BEDTIME 90 tablet 1   metoprolol tartrate (LOPRESSOR) 50 MG tablet Take 1 tablet (50 mg total) by mouth 3 (three) times daily. 270 tablet 3   NIFEdipine (PROCARDIA XL) 60 MG 24 hr tablet Take 1 tablet (60 mg total) by mouth daily. 90 tablet 3   nitroGLYCERIN (NITROLINGUAL) 0.4 MG/SPRAY spray PLACE 1 SPRAY UNDER THE TONGUE EVERY 5 MINUTES AS NEEDED FOR CHEST PAIN. MAY REPEAT 3 TIMES 4.9 g 2   potassium chloride SA (KLOR-CON M20) 20 MEQ tablet Take 1 tablet (20 mEq total) by mouth daily. 90 tablet 3   levothyroxine (SYNTHROID) 137 MCG tablet TAKE 1 TABLET BY MOUTH ONCE DAILY BEFORE BREAKFAST every other day and alternate with one half every other day 90  tablet 3   No facility-administered medications prior to visit.    ROS: Review of Systems  Constitutional:  Negative for activity change, appetite change, chills, fatigue and unexpected weight change.  HENT:  Negative for congestion, mouth sores and sinus pressure.   Eyes:  Negative for visual disturbance.  Respiratory:  Negative for cough and chest tightness.   Gastrointestinal:  Negative for abdominal pain and nausea.  Genitourinary:  Negative for difficulty urinating, frequency and vaginal pain.  Musculoskeletal:  Negative for back pain and gait problem.  Skin:  Negative for pallor and rash.  Neurological:  Negative for dizziness, tremors, weakness, numbness and headaches.  Psychiatric/Behavioral:  Negative for confusion, sleep disturbance and suicidal ideas. The patient is nervous/anxious.     Objective:  BP (!) 200/105 (BP Location: Left Arm)   Pulse 65   Temp 98.1 F (36.7 C) (Oral)   Ht 5\' 8"  (1.727 m)   Wt 139 lb 3.2 oz (63.1 kg)   SpO2 97%   BMI 21.17 kg/m   BP Readings from Last 3 Encounters:  03/20/22 (!) 200/105  03/13/22 (!) 200/90  02/15/22 (!) 182/102    Wt Readings from Last 3 Encounters:  03/20/22 139 lb 3.2 oz (63.1 kg)  03/13/22 142 lb (64.4 kg)  02/15/22 139 lb (  63 kg)    Physical Exam Constitutional:      General: She is not in acute distress.    Appearance: Normal appearance. She is well-developed.  HENT:     Head: Normocephalic.     Right Ear: External ear normal.     Left Ear: External ear normal.     Nose: Nose normal.  Eyes:     General:        Right eye: No discharge.        Left eye: No discharge.     Conjunctiva/sclera: Conjunctivae normal.     Pupils: Pupils are equal, round, and reactive to light.  Neck:     Thyroid: No thyromegaly.     Vascular: No JVD.     Trachea: No tracheal deviation.  Cardiovascular:     Rate and Rhythm: Normal rate and regular rhythm.     Heart sounds: Normal heart sounds.  Pulmonary:     Effort:  No respiratory distress.     Breath sounds: No stridor. No wheezing.  Abdominal:     General: Bowel sounds are normal. There is no distension.     Palpations: Abdomen is soft. There is no mass.     Tenderness: There is no abdominal tenderness. There is no guarding or rebound.  Musculoskeletal:        General: No tenderness.     Cervical back: Normal range of motion and neck supple. No rigidity.  Lymphadenopathy:     Cervical: No cervical adenopathy.  Skin:    Findings: No erythema or rash.  Neurological:     Mental Status: She is oriented to person, place, and time.     Cranial Nerves: No cranial nerve deficit.     Motor: No abnormal muscle tone.     Coordination: Coordination normal.     Deep Tendon Reflexes: Reflexes normal.  Psychiatric:        Behavior: Behavior normal.        Thought Content: Thought content normal.        Judgment: Judgment normal.     Lab Results  Component Value Date   WBC 8.3 03/02/2021   HGB 11.5 (L) 03/02/2021   HCT 34.7 (L) 03/02/2021   PLT 282.0 03/02/2021   GLUCOSE 85 03/13/2022   CHOL 235 (H) 12/29/2019   TRIG 100 12/29/2019   HDL 69 12/29/2019   LDLDIRECT 188.2 01/10/2012   LDLCALC 145 (H) 12/29/2019   ALT 10 07/31/2021   AST 16 07/31/2021   NA 144 03/13/2022   K 4.2 03/13/2022   CL 103 03/13/2022   CREATININE 1.21 (H) 03/13/2022   BUN 15 03/13/2022   CO2 26 03/13/2022   TSH <0.005 (L) 03/13/2022   INR 1.0 08/01/2018    No results found.  Assessment & Plan:   Problem List Items Addressed This Visit     Essential hypertension    Worse. SBP 144-178 at home I suggested to d/c HRT due to HTN. Pt states she started it for heart health - by Dr Gabriel Rung Stop Levothyroxine 137 mcg for 5 days. Start Levothyroxine 100 mcg every other day and 50 mcg every other day. Clonidine prn for SBP>190      Hormone replacement therapy (HRT) - Primary    I suggested to d/c HRT due to HTN. Pt states she started it for heart health - by Dr Gabriel Rung Stop  Levothyroxine 137 mcg for 5 days. Start Levothyroxine 100 mcg every other day and 50 mcg every other  day.      Hot flash, menopausal    I suggested to d/c HRT. Pt states she started it for heart health - by Dr Gabriel Rung Stop Levothyroxine 137 mcg for 5 days. Start Levothyroxine 100 mcg every other day and 50 mcg every other day.      Hypothyroidism    Elevated FT4 Stop Levothyroxine 137 mcg for 5 days. Start Levothyroxine 100 mcg every other day and 50 mcg every other day.         Meds ordered this encounter  Medications   estradiol cypionate (DEPO-ESTRADIOL) 5 MG/ML injection 5 mg      Follow-up: No follow-ups on file.  Sonda Primes, MD

## 2022-03-20 NOTE — Assessment & Plan Note (Signed)
Worse. SBP 144-178 at home I suggested to d/c HRT due to HTN. Pt states she started it for heart health - by Dr Wille Glaser Stop Levothyroxine 137 mcg for 5 days. Start Levothyroxine 100 mcg every other day and 50 mcg every other day. Clonidine prn for SBP>190

## 2022-03-23 LAB — T4, FREE: Free T4: 2.15 ng/dL — ABNORMAL HIGH (ref 0.82–1.77)

## 2022-03-23 LAB — SPECIMEN STATUS REPORT

## 2022-03-26 ENCOUNTER — Telehealth: Payer: Self-pay | Admitting: Cardiovascular Disease
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
## 2022-03-27 ENCOUNTER — Ambulatory Visit: Payer: Medicare Other | Admitting: Physician Assistant

## 2022-03-27 MED ORDER — CARVEDILOL 12.5 MG PO TABS: 12.5000 mg | ORAL_TABLET | Freq: Two times a day (BID) | ORAL | 3 refills | Status: DC

## 2022-03-27 NOTE — Telephone Encounter (Signed)
Call placed to pt regarding blood pressure readings.  She has been made aware to stop Metoprolol and start Carvedilol 12.5 mg bid.  Prescription has been sent to [facility].  Pt confirms her appointment.

## 2022-03-27 NOTE — Telephone Encounter (Signed)
BPs are still above goal. I think it is worth changing Metoprolol to Carvedilol for better blood pressure control.  PLAN: -DC Metoprolol tartrate -Start Carvedilol 12.5 mg twice daily  -Keep F/u as planned.  Richardson Dopp, PA-C    03/27/2022 1:07 PM   .

## 2022-03-28 ENCOUNTER — Encounter: Payer: Self-pay | Admitting: Physician Assistant

## 2022-03-28 ENCOUNTER — Ambulatory Visit: Payer: Medicare Other | Attending: Physician Assistant | Admitting: Physician Assistant

## 2022-03-28 ENCOUNTER — Telehealth: Payer: Self-pay | Admitting: Cardiovascular Disease

## 2022-03-28 VITALS — BP 220/116 | HR 97 | Ht 66.0 in | Wt 140.0 lb

## 2022-03-28 DIAGNOSIS — I10 Essential (primary) hypertension: Secondary | ICD-10-CM

## 2022-03-28 DIAGNOSIS — E034 Atrophy of thyroid (acquired): Secondary | ICD-10-CM

## 2022-03-28 DIAGNOSIS — I2510 Atherosclerotic heart disease of native coronary artery without angina pectoris: Secondary | ICD-10-CM | POA: Diagnosis not present

## 2022-03-28 DIAGNOSIS — N1831 Chronic kidney disease, stage 3a: Secondary | ICD-10-CM | POA: Diagnosis not present

## 2022-03-28 DIAGNOSIS — I25119 Atherosclerotic heart disease of native coronary artery with unspecified angina pectoris: Secondary | ICD-10-CM

## 2022-03-28 DIAGNOSIS — I160 Hypertensive urgency: Secondary | ICD-10-CM | POA: Diagnosis not present

## 2022-03-28 DIAGNOSIS — R072 Precordial pain: Secondary | ICD-10-CM | POA: Insufficient documentation

## 2022-03-28 MED ORDER — CLONIDINE HCL 0.1 MG PO TABS: 0.1000 mg | ORAL_TABLET | Freq: Three times a day (TID) | ORAL | 3 refills | Status: DC | PRN

## 2022-03-28 MED ORDER — CLONIDINE HCL 0.1 MG PO TABS: 0.1000 mg | ORAL_TABLET | Freq: Two times a day (BID) | ORAL | 11 refills | Status: DC | PRN

## 2022-03-28 MED ORDER — METOPROLOL TARTRATE 50 MG PO TABS: 75.0000 mg | ORAL_TABLET | Freq: Two times a day (BID) | ORAL | 3 refills | Status: DC

## 2022-03-28 MED ORDER — NITROGLYCERIN 0.4 MG/SPRAY TL SOLN: 6 refills | Status: DC

## 2022-03-28 MED ORDER — METOPROLOL TARTRATE 50 MG PO TABS: 75.0000 mg | ORAL_TABLET | Freq: Three times a day (TID) | ORAL | 3 refills | Status: DC

## 2022-03-28 NOTE — Patient Instructions (Addendum)
Medication Instructions:   START  Lopressor 50 mg taking 1 1/2 tablet three times a day  CONTINUE Clonidine taking 0.1 mg twice a day as needed for blood pressures over 160/100  *If you need a refill on your cardiac medications before your next appointment, please call your pharmacy*   Lab Work: None ordered  If you have labs (blood work) drawn today and your tests are completely normal, you will receive your results only by: MyChart Message (if you have MyChart) OR A paper copy in the mail If you have any lab test that is abnormal or we need to change your treatment, we will call you to review the results.   Testing/Procedures: Your physician has requested that you have an echocardiogram. Echocardiography is a painless test that uses sound waves to create images of your heart. It provides your doctor with information about the size and shape of your heart and how well your heart's chambers and valves are working. This procedure takes approximately one hour. There are no restrictions for this procedure. Please do NOT wear cologne, perfume, aftershave, or lotions (deodorant is allowed). Please arrive 15 minutes prior to your appointment time.  Your physician has requested that you have a renal artery duplex. During this test, an ultrasound is used to evaluate blood flow to the kidneys. Allow one hour for this exam. Do not eat after midnight the day before and avoid carbonated beverages. Take your medications as you usually do.    Follow-Up: At [facility], you and your health needs are our priority.  As part of our continuing mission to provide you with exceptional heart care, we have created designated Provider Care Teams.  These Care Teams include your primary Cardiologist (physician) and Advanced Practice Providers (APPs -  Physician Assistants and Nurse Practitioners) who all work together to provide you with the care you need, when you need it.  We recommend signing up for  the patient portal called "MyChart".  Sign up information is provided on this After Visit Summary.  MyChart is used to connect with patients for Virtual Visits (Telemedicine).  Patients are able to view lab/test results, encounter notes, upcoming appointments, etc.  Non-urgent messages can be sent to your provider as well.   To learn more about what you can do with MyChart, go to [email or web address].    Your next appointment:   2 week(s)  The format for your next appointment:   In Person  Provider:   Tonny Bollman, MD  or Tereso Newcomer, PA-C         Other Instructions PLEASE GO TO THE EMERGENCY ROOM OVER AT [id number].   Important Information About Sugar

## 2022-03-28 NOTE — Telephone Encounter (Signed)
*  STAT* If patient is at the pharmacy, call can be transferred to refill team.   1. Which medications need to be refilled? (please list name of each medication and dose if known) nitroGLYCERIN (NITROLINGUAL) 0.4 MG/SPRAY spray   2. Which pharmacy/location (including street and city if local pharmacy) is medication to be sent to?  [facility] [address], [place] - [address]    3. Do they need a 30 day or 90 day supply? 90

## 2022-03-28 NOTE — Assessment & Plan Note (Signed)
Recent TSH low.  Levothyroxine has been adjusted.  Question if this could have been contributing somewhat to her elevated blood pressure.  If so, her blood pressure should improve as this corrects.

## 2022-03-28 NOTE — Progress Notes (Signed)
Cardiology Office Note:    Date:  03/28/2022   ID:  [name], DOB [birth year], MRN [id number]  PCP:  Tresa Garter, MD  [facility] Providers Cardiologist:  Tonny Bollman, MD    Referring MD: Tresa Garter, MD   Chief Complaint:  F/u for BP    Patient Profile: Coronary artery disease S/p remote PCI to the LAD  Cath 2011: patent LAD PCI site (mLAD 30-40, D1 40; EF 65-70)  Hypertension  Hx of "white coat" HTN Echo 11/16/2016: EF 55-60, no RWMA, GR 1 DD, mildly increased PASP, mild TR  Hyperlipidemia  Intol of all lipid lowering Rx Hypothyroidism  Chronic kidney disease, stage III Carotid artery stenosis [place] 08/03/16: bilat ICA 1-39  Cardiac Studies & Procedures     STRESS TESTS  MYOCARDIAL PERFUSION IMAGING 06/01/2015   ECHOCARDIOGRAM  ECHOCARDIOGRAM COMPLETE 11/16/2016  Narrative *[facility]* [address] [place], [place] [address] [phone]  ------------------------------------------------------------------- Transthoracic Echocardiography  Patient:    [name], [name] MR #:       [id number] Study Date: 11/16/2016 Gender:     F Age:        76 Height:     172.7 cm Weight:     74.8 kg BSA:        1.9 m^2 Pt. Status: Room:  ATTENDING    Olga Millers [facility]     Tonny Bollman, MD REFERRING    Tonny Bollman, MD SONOGRAPHER  Aida Raider, RDCS PERFORMING   [facility], Outpatient  cc:  ------------------------------------------------------------------- LV EF: 55% -   60%  ------------------------------------------------------------------- Indications:      R06.02 Shortness of Breath.  ------------------------------------------------------------------- History:   PMH:  Acquired from the patient and from the patient&'s chart.  PMH:  CAD. Anxiety. Hypothyroidism.  Risk factors: Hypertension. Dyslipidemia.  ------------------------------------------------------------------- Study Conclusions  - Left ventricle: The  cavity size was normal. Wall thickness was normal. Systolic function was normal. The estimated ejection fraction was in the range of 55% to 60%. Wall motion was normal; there were no regional wall motion abnormalities. Doppler parameters are consistent with abnormal left ventricular relaxation (grade 1 diastolic dysfunction). Doppler parameters are consistent with high ventricular filling pressure. - Pulmonary arteries: Systolic pressure was mildly increased.  Impressions:  - Normal LV systolic function; mild diastolic dysfunction with elevated LV filling pressure; mild TR with mildly elevated pulmonary pressure.  ------------------------------------------------------------------- Study data:   Study status:  Routine.  Procedure:  The patient reported no pain pre or post test. Transthoracic echocardiography for left ventricular function evaluation, for right ventricular function evaluation, and for assessment of valvular function. Image quality was adequate.  Study completion:  There were no complications.          Transthoracic echocardiography.  M-mode, complete 2D, spectral Doppler, and color Doppler.  Birthdate: Patient birthdate: 05-11-1946.  Age:  Patient is 76 yr old.  Sex: Gender: female.    BMI: 25.1 kg/m^2.  Blood pressure:     150/80 Patient status:  Outpatient.  Study date:  Study date: 11/16/2016. Study time: 03:08 PM.  Location:  [facility]  -------------------------------------------------------------------  ------------------------------------------------------------------- Left ventricle:  The cavity size was normal. Wall thickness was normal. Systolic function was normal. The estimated ejection fraction was in the range of 55% to 60%. Wall motion was normal; there were no regional wall motion abnormalities. Doppler parameters are consistent with abnormal left ventricular relaxation (grade 1 diastolic dysfunction). Doppler parameters are consistent with  high ventricular filling  pressure.  ------------------------------------------------------------------- Aortic valve:   Trileaflet; mildly thickened leaflets. Mobility was not restricted.  Doppler:  Transvalvular velocity was within the normal range. There was no stenosis. There was no regurgitation.  ------------------------------------------------------------------- Aorta:  Aortic root: The aortic root was normal in size.  ------------------------------------------------------------------- Mitral valve:   Mildly thickened leaflets . Mobility was not restricted.  Doppler:  Transvalvular velocity was within the normal range. There was no evidence for stenosis. There was no regurgitation.    Peak gradient (D): 4 mm Hg.  ------------------------------------------------------------------- Left atrium:  The atrium was normal in size.  ------------------------------------------------------------------- Right ventricle:  The cavity size was normal. Systolic function was normal.  ------------------------------------------------------------------- Pulmonic valve:    Doppler:  Transvalvular velocity was within the normal range. There was no evidence for stenosis.  ------------------------------------------------------------------- Tricuspid valve:   Structurally normal valve.    Doppler: Transvalvular velocity was within the normal range. There was mild regurgitation.  ------------------------------------------------------------------- Pulmonary artery:   Systolic pressure was mildly increased.  ------------------------------------------------------------------- Right atrium:  The atrium was normal in size.  ------------------------------------------------------------------- Pericardium:  There was no pericardial effusion.  ------------------------------------------------------------------- Systemic veins: Inferior vena cava: The vessel was normal in  size.  ------------------------------------------------------------------- Measurements  Left ventricle                         Value        Reference LV ID, ED, PLAX chordal        (L)     37.2  mm     43 - 52 LV ID, ES, PLAX chordal        (L)     22.2  mm     23 - 38 LV fx shortening, PLAX chordal         40    %      >=29 LV PW thickness, ED                    9.03  mm     --------- IVS/LV PW ratio, ED                    1.06         <=1.3 Stroke volume, 2D                      57    ml     --------- Stroke volume/bsa, 2D                  30    ml/m^2 --------- LV e&', lateral                         10.1  cm/s   --------- LV E/e&', lateral                       9.87         --------- LV e&', medial                          5.08  cm/s   --------- LV E/e&', medial                        19.63        --------- LV e&', average  7.59  cm/s   --------- LV E/e&', average                       13.14        ---------  Ventricular septum                     Value        Reference IVS thickness, ED                      9.53  mm     ---------  LVOT                                   Value        Reference LVOT ID, S                             18    mm     --------- LVOT area                              2.54  cm^2   --------- LVOT ID                                18    mm     --------- LVOT peak velocity, S                  89.4  cm/s   --------- LVOT mean velocity, S                  71.7  cm/s   --------- LVOT VTI, S                            22.5  cm     --------- LVOT peak gradient, S                  3     mm Hg  --------- Stroke volume (SV), LVOT DP            57.3  ml     --------- Stroke index (SV/bsa), LVOT DP         30.1  ml/m^2 ---------  Aorta                                  Value        Reference Aortic root ID, ED                     29    mm     --------- Ascending aorta ID, A-P, S             34    mm     ---------  Left atrium                             Value        Reference LA ID, A-P, ES  36    mm     --------- LA ID/bsa, A-P                         1.89  cm/m^2 <=2.2 LA volume, S                           48    ml     --------- LA volume/bsa, S                       25.2  ml/m^2 --------- LA volume, ES, 1-p A4C                 40    ml     --------- LA volume/bsa, ES, 1-p A4C             21    ml/m^2 --------- LA volume, ES, 1-p A2C                 56    ml     --------- LA volume/bsa, ES, 1-p A2C             29.4  ml/m^2 ---------  Mitral valve                           Value        Reference Mitral E-wave peak velocity            99.7  cm/s   --------- Mitral A-wave peak velocity            95.3  cm/s   --------- Mitral deceleration time               194   ms     150 - 230 Mitral peak gradient, D                4     mm Hg  --------- Mitral E/A ratio, peak                 1            ---------  Tricuspid valve                        Value        Reference Tricuspid regurg peak velocity         296   cm/s   --------- Tricuspid peak RV-RA gradient          35    mm Hg  ---------  Right ventricle                        Value        Reference RV s&', lateral, S                      10.7  cm/s   ---------  Legend: (L)  and  (H)  mark values outside specified reference range.  ------------------------------------------------------------------- Prepared and Electronically Authenticated by  Olga Millers 2018-06-29T16:04:09              History of Present Illness:   [name] is a 76 y.o. female with the above problem list.  She was last seen 03/13/2022.  She had recently been diagnosed with macular degeneration and started on  intraocular injections (Avastin).  She has previously had difficult to control blood pressures but, recently, her blood pressures have been more difficult to control.  She has sensitivities to several medications.  I increased her nifedipine to 60 mg daily.  She called  in recently with blood pressures still elevated.  I recommended changing her metoprolol tartrate to carvedilol 12.5 mg twice daily.  Of note, her labs demonstrated a depressed TSH (<0.005) and elevated T4.  Her levothyroxine dose was reduced and she did follow-up with primary care.    She returns for follow-up. She is here alone. She started on Coreg last night. She developed abdominal pain and diarrhea shortly afterward. She brings in detailed list of her symptoms since she started on Coreg. She has had recurrent loose stool, gas and dizziness. On the way to the office today she had brief, focal pain in her L wrist. She was concerned that his was a cardiac symptom. Since that time, she has had chest pressure. She has not had radiating symptoms, nausea, diaphoresis. She was not experiencing any exertional chest pain earlier today (sweeping leaves at her home entrance). She felt like this was similar to her previous angina. She has not had dyspnea on exertion, orthopnea, leg edema.         Past Medical History:  Diagnosis Date   Anxiety state, unspecified    Bronchitis, acute    Carotid bruit    Coronary atherosclerosis of unspecified type of vessel, native or graft    multiple angioplasy procedures in 90's/early 2000's   De Quervain's tenosynovitis    Depressive disorder, not elsewhere classified    Female bladder prolapse    Lichen 2011   Vaginal   Other and unspecified hyperlipidemia    Unspecified essential hypertension    Unspecified hypothyroidism    Current Medications: Current Meds  Medication Sig   aspirin 81 MG EC tablet Take 81 mg by mouth daily.   Cholecalciferol 1000 UNITS tablet Take 1,000 Units by mouth daily.   clobetasol ointment (TEMOVATE) 0.05 % Apply 1 application. topically 2 (two) times daily as needed.   clorazepate (TRANXENE) 15 MG tablet TAKE 1 TABLET BY MOUTH THREE TIMES DAILY AS NEEDED FOR ANXIETY (DO  NOT  TAKE  WITH  LORAZEPAM)   doxepin (SINEQUAN) 50 MG  capsule Take 3 capsules (150 mg total) by mouth at bedtime.   estradiol (ESTRACE) 0.1 MG/GM vaginal cream Place 1 Applicatorful vaginally as needed (irritation).   estradiol cypionate (DEPO-ESTRADIOL) 5 MG/ML injection Use 0.3 ml every 18-25 days IM   furosemide (LASIX) 40 MG tablet Take 1-2 tablets (40-80 mg total) by mouth daily.   levothyroxine (SYNTHROID) 100 MCG tablet Start Levothyroxine 100 mcg every other day and 50 mcg every other day.   LORazepam (ATIVAN) 0.5 MG tablet TAKE 1 TABLET BY MOUTH AT BEDTIME   metoprolol tartrate (LOPRESSOR) 50 MG tablet Take 1.5 tablets (75 mg total) by mouth 2 (two) times daily.   NIFEdipine (PROCARDIA XL) 60 MG 24 hr tablet Take 1 tablet (60 mg total) by mouth daily.   potassium chloride SA (KLOR-CON M20) 20 MEQ tablet Take 1 tablet (20 mEq total) by mouth daily.   [DISCONTINUED] carvedilol (COREG) 12.5 MG tablet Take 1 tablet (12.5 mg total) by mouth 2 (two) times daily.   [DISCONTINUED] cloNIDine (CATAPRES) 0.1 MG tablet Take 1 tablet (0.1 mg total) by mouth 3 (three) times daily as needed (take for BP>180/110). (Patient taking differently: Take 0.1 mg by mouth 3 (three)  times daily as needed (take for BP>160/100).)   [DISCONTINUED] nitroGLYCERIN (NITROLINGUAL) 0.4 MG/SPRAY spray PLACE 1 SPRAY UNDER THE TONGUE EVERY 5 MINUTES AS NEEDED FOR CHEST PAIN. MAY REPEAT 3 TIMES    Allergies:   Epinephrine, Isosorbide mononitrate, Lidocaine, Morphine, Propoxyphene n-acetaminophen, Alprazolam, Atorvastatin, Codeine, Ezetimibe, Prilosec [omeprazole], Rosuvastatin, Simvastatin, Allopurinol, Meloxicam, Metronidazole, Tetracycline, Triple antibiotic [bacitracin-neomycin-polymyxin], Amoxicillin, Ciprofloxacin, and Diphenhydramine hcl   Social History   Tobacco Use   Smoking status: Former    Types: Cigarettes    Quit date: 07/22/1991    Years since quitting: 30.7   Smokeless tobacco: Never  Vaping Use   Vaping Use: Never used  Substance Use Topics   Alcohol use: No    Drug use: No    Family Hx: The patient's family history includes Coronary artery disease in an other family member; Hypertension in her father, mother, and another family member.  Review of Systems  Constitutional: Negative for fever.  Respiratory:  Negative for cough.   Gastrointestinal:  Negative for hematochezia.  Genitourinary:  Negative for hematuria.     EKGs/Labs/Other Test Reviewed:    EKG:  EKG is   ordered today.  The ekg ordered today demonstrates normal sinus rhythm, HR 88, normal axis, LVH, no acute ST-TW changes, QTc 457 ms   Recent Labs: 07/31/2021: ALT 10 03/13/2022: BUN 15; Creatinine, Ser 1.21; Magnesium 2.0; Potassium 4.2; Sodium 144; TSH <0.005   Recent Lipid Panel No results for input(s): "CHOL", "TRIG", "HDL", "VLDL", "LDLCALC", "LDLDIRECT" in the last 8760 hours.    Risk Assessment/Calculations/Metrics:         HYPERTENSION CONTROL Vitals:   03/28/22 1414 03/28/22 1736 03/28/22 1737  BP: (!) 200/84 (!) 210/120 (!) 220/116    The patient's blood pressure is elevated above target today.  In order to address the patient's elevated BP: A current anti-hypertensive medication was adjusted today.; A new medication was prescribed today.       Physical Exam:    VS:  BP (!) 220/116 (BP Location: Left Arm)   Pulse 97   Ht 5\' 6"  (1.676 m)   Wt 140 lb (63.5 kg)   SpO2 98%   BMI 22.60 kg/m     Wt Readings from Last 3 Encounters:  03/28/22 140 lb (63.5 kg)  03/20/22 139 lb 3.2 oz (63.1 kg)  03/13/22 142 lb (64.4 kg)    Constitutional:      Appearance: Healthy appearance. Not in distress.  Neck:     Vascular: JVD normal.  Pulmonary:     Effort: Pulmonary effort is normal.     Breath sounds: No wheezing. No rales.  Cardiovascular:     Normal rate. Regular rhythm. Normal S1. Normal S2.      Murmurs: There is no murmur.  Edema:    Peripheral edema absent.  Abdominal:     Palpations: Abdomen is soft.  Skin:    General: Skin is warm and dry.   Neurological:     Mental Status: Alert and oriented to person, place and time.         ASSESSMENT & PLAN:   Hypertensive urgency Her BP is markedly elevated here. She notes substernal chest pain. BP is equally elevated in both arms. Her EKG does not demonstrate any acute changes. However, she has markedly elevated BP with symptoms of end organ damage. I have recommended that she go to the ED for evaluation and management. I explained that she would need cardiac enzymes, a chest CT and medications to get  her BP down while being monitored. I explained the dangers of not having this evaluated and treated which includes (but is not limited to) myocardial infarction, aortic dissection, death. I reviewed my recommendations with Dr. Elberta Fortis (attending MD) who agreed. The pt declines to go to the ED tonight. She did feel like her chest pain resolved while in the room. However, I have explained to her that it is in her best interest to go to the ED for E/M. However, she declines. She plans to go home and take a NTG if her chest pain returns and go to the ED if she feels worse. I have advised her that if she returns home and her BP is still >/= 180/100, she should go to the ED.   She is not tolerating the Carvedilol. She will need to go back on Metoprolol. She did not start the Clonidine yet. DC Carvedilol Restart Metoprolol tartrate at increased dose of 75 mg three times a day. Continue Nifedipine 60 mg once daily, Lasix 40-80 mg once daily. Start Clonidine 0.1 mg twice daily as needed for BP >/= 160/100 Arrange RA ultrasound to r/o RA stenosis Consider Plasma Metanephrines, Aldosterone + Renin Activity w Ratio if BP remains difficult to control Consider referral to Adv HTN Clinic  F/u 2 weeks  Coronary artery disease involving native coronary artery with angina p[facility]) Remote history of PCI to the LAD.  Nonobstructive disease by cardiac catheterization in 2011.  As noted, she is having chest  discomfort today with markedly elevated blood pressures.  I have recommended she go to the emergency room as outlined.  I will arrange an echocardiogram.  If she ends up going to the hospital and get an echocardiogram they are, we will need to cancel the one here.  Consider stress testing once blood pressure is better controlled.  Continue aspirin 81 mg daily, beta-blocker.  She is intolerant of all statins.  Hypothyroidism Recent TSH low.  Levothyroxine has been adjusted.  Question if this could have been contributing somewhat to her elevated blood pressure.  If so, her blood pressure should improve as this corrects.   Time spent with patient today: 49 minutes          Dispo:  Return for Close Follow Up, w/ Dr. Excell Seltzer, or Tereso Newcomer, PA-C.   Medication Adjustments/Labs and Tests Ordered: Current medicines are reviewed at length with the patient today.  Concerns regarding medicines are outlined above.  Tests Ordered: Orders Placed This Encounter  Procedures   EKG 12-Lead   ECHOCARDIOGRAM COMPLETE   VAS US RENAL ARTERY DUPLEX   Medication Changes: Meds ordered this encounter  Medications   metoprolol tartrate (LOPRESSOR) 50 MG tablet    Sig: Take 1.5 tablets (75 mg total) by mouth 2 (two) times daily.    Dispense:  405 tablet    Refill:  3   cloNIDine (CATAPRES) 0.1 MG tablet    Sig: Take 1 tablet (0.1 mg total) by mouth 3 (three) times daily as needed (take for BP>160/100).    Dispense:  90 tablet    Refill:  3   Signed, Tereso Newcomer, PA-C  03/28/2022 5:55 PM    [facility] [address] [place], [place], [place]  [address] Phone: [phone]; Fax: [phone]

## 2022-03-28 NOTE — Telephone Encounter (Signed)
Pt's medication was sent to pt's pharmacy as requested. Confirmation received.  °

## 2022-03-28 NOTE — Assessment & Plan Note (Signed)
Remote history of PCI to the LAD.  Nonobstructive disease by cardiac catheterization in 2011.  As noted, she is having chest discomfort today with markedly elevated blood pressures.  I have recommended she go to the emergency room as outlined.  I will arrange an echocardiogram.  If she ends up going to the hospital and get an echocardiogram they are, we will need to cancel the one here.  Consider stress testing once blood pressure is better controlled.  Continue aspirin 81 mg daily, beta-blocker.  She is intolerant of all statins.

## 2022-03-28 NOTE — Assessment & Plan Note (Signed)
Her BP is markedly elevated here. She notes substernal chest pain. BP is equally elevated in both arms. Her EKG does not demonstrate any acute changes. However, she has markedly elevated BP with symptoms of end organ damage. I have recommended that she go to the ED for evaluation and management. I explained that she would need cardiac enzymes, a chest CT and medications to get her BP down while being monitored. I explained the dangers of not having this evaluated and treated which includes (but is not limited to) myocardial infarction, aortic dissection, death. I reviewed my recommendations with Dr. Elberta Fortis (attending MD) who agreed. The pt declines to go to the ED tonight. She did feel like her chest pain resolved while in the room. However, I have explained to her that it is in her best interest to go to the ED for E/M. However, she declines. She plans to go home and take a NTG if her chest pain returns and go to the ED if she feels worse. I have advised her that if she returns home and her BP is still >/= 180/100, she should go to the ED.   She is not tolerating the Carvedilol. She will need to go back on Metoprolol. She did not start the Clonidine yet. DC Carvedilol Restart Metoprolol tartrate at increased dose of 75 mg three times a day. Continue Nifedipine 60 mg once daily, Lasix 40-80 mg once daily. Start Clonidine 0.1 mg twice daily as needed for BP >/= 160/100 Arrange RA ultrasound to r/o RA stenosis Consider Plasma Metanephrines, Aldosterone + Renin Activity w Ratio if BP remains difficult to control Consider referral to Adv HTN Clinic  F/u 2 weeks

## 2022-03-29 ENCOUNTER — Encounter: Payer: Self-pay | Admitting: Cardiovascular Disease

## 2022-03-30 ENCOUNTER — Encounter (HOSPITAL_COMMUNITY): Payer: Medicare Other

## 2022-04-05 ENCOUNTER — Encounter: Payer: Self-pay | Admitting: Cardiovascular Disease

## 2022-04-11 ENCOUNTER — Ambulatory Visit: Payer: Medicare Other | Admitting: Physician Assistant

## 2022-04-17 ENCOUNTER — Other Ambulatory Visit (HOSPITAL_COMMUNITY): Payer: Medicare Other

## 2022-04-17 ENCOUNTER — Ambulatory Visit: Payer: Medicare Other | Admitting: Internal Medicine

## 2022-04-18 ENCOUNTER — Ambulatory Visit (HOSPITAL_COMMUNITY)
Admission: RE | Admit: 2022-04-18 | Payer: Medicare Other | Source: Ambulatory Visit | Attending: Physician Assistant | Admitting: Physician Assistant

## 2022-04-19 ENCOUNTER — Other Ambulatory Visit (HOSPITAL_COMMUNITY): Payer: Medicare Other

## 2022-04-24 ENCOUNTER — Ambulatory Visit: Payer: Medicare Other | Admitting: Internal Medicine

## 2022-05-01 ENCOUNTER — Ambulatory Visit: Payer: Medicare Other | Admitting: Physician Assistant

## 2022-05-03 ENCOUNTER — Ambulatory Visit: Payer: Medicare Other | Admitting: Internal Medicine

## 2022-05-15 ENCOUNTER — Other Ambulatory Visit: Payer: Self-pay | Admitting: Internal Medicine

## 2022-05-16 ENCOUNTER — Ambulatory Visit (HOSPITAL_COMMUNITY): Payer: Medicare Other

## 2022-05-17 ENCOUNTER — Other Ambulatory Visit (HOSPITAL_COMMUNITY): Payer: Medicare Other

## 2022-05-23 ENCOUNTER — Ambulatory Visit: Payer: Medicare Other | Admitting: Internal Medicine

## 2022-05-30 ENCOUNTER — Ambulatory Visit: Payer: Medicare Other | Admitting: Physician Assistant

## 2022-06-11 ENCOUNTER — Encounter: Payer: Self-pay | Admitting: Internal Medicine

## 2022-06-11 ENCOUNTER — Ambulatory Visit (INDEPENDENT_AMBULATORY_CARE_PROVIDER_SITE_OTHER): Payer: Medicare Other | Admitting: Internal Medicine

## 2022-06-11 VITALS — BP 180/90 | HR 90 | Temp 98.6°F | Ht 66.0 in | Wt 141.0 lb

## 2022-06-11 DIAGNOSIS — Z79890 Hormone replacement therapy (postmenopausal): Secondary | ICD-10-CM

## 2022-06-11 DIAGNOSIS — I10 Essential (primary) hypertension: Secondary | ICD-10-CM | POA: Diagnosis not present

## 2022-06-11 DIAGNOSIS — N951 Menopausal and female climacteric states: Secondary | ICD-10-CM

## 2022-06-11 DIAGNOSIS — E034 Atrophy of thyroid (acquired): Secondary | ICD-10-CM

## 2022-06-11 DIAGNOSIS — F419 Anxiety disorder, unspecified: Secondary | ICD-10-CM

## 2022-06-11 MED ORDER — ESTRADIOL CYPIONATE 5 MG/ML IM OIL: 1.0000 mg | TOPICAL_OIL | Freq: Once | INTRAMUSCULAR | Status: DC

## 2022-06-11 MED ORDER — ESTRADIOL CYPIONATE 5 MG/ML IM OIL
1.0000 mg | TOPICAL_OIL | Freq: Once | INTRAMUSCULAR | Status: AC
  Administered 2022-06-11: 1 mg via INTRAMUSCULAR

## 2022-06-11 NOTE — Assessment & Plan Note (Signed)
On Depo - inj
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
## 2022-06-11 NOTE — Assessment & Plan Note (Signed)
No effect on BP when off HRT x 2 mo. Feeling bad off HRT. Will re-start HRT

## 2022-06-11 NOTE — Addendum Note (Signed)
Addended by: Marijean Heath R on: 06/11/2022 03:45 PM   Modules accepted: Orders

## 2022-06-11 NOTE — Assessment & Plan Note (Signed)
Start Levothyroxine 100 mcg every other day and 50 mcg every other day.
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
## 2022-06-27 ENCOUNTER — Encounter (HOSPITAL_COMMUNITY): Payer: Medicare Other

## 2022-06-29 ENCOUNTER — Other Ambulatory Visit (HOSPITAL_COMMUNITY): Payer: Medicare Other

## 2022-07-10 ENCOUNTER — Ambulatory Visit: Payer: Medicare Other | Admitting: Internal Medicine

## 2022-07-12 ENCOUNTER — Ambulatory Visit: Payer: Medicare Other | Admitting: Internal Medicine

## 2022-07-16 ENCOUNTER — Ambulatory Visit: Payer: Medicare Other | Admitting: Physician Assistant

## 2022-08-07 ENCOUNTER — Other Ambulatory Visit: Payer: Self-pay | Admitting: Internal Medicine

## 2022-08-16 DIAGNOSIS — Z1231 Encounter for screening mammogram for malignant neoplasm of breast: Secondary | ICD-10-CM | POA: Diagnosis not present

## 2022-08-16 LAB — HM MAMMOGRAPHY

## 2022-08-22 ENCOUNTER — Ambulatory Visit (INDEPENDENT_AMBULATORY_CARE_PROVIDER_SITE_OTHER): Payer: Medicare Other | Admitting: Internal Medicine

## 2022-08-22 ENCOUNTER — Encounter: Payer: Self-pay | Admitting: Internal Medicine

## 2022-08-22 VITALS — BP 172/90 | HR 76 | Temp 98.3°F | Ht 66.0 in

## 2022-08-22 DIAGNOSIS — F419 Anxiety disorder, unspecified: Secondary | ICD-10-CM

## 2022-08-22 DIAGNOSIS — K649 Unspecified hemorrhoids: Secondary | ICD-10-CM | POA: Diagnosis not present

## 2022-08-22 DIAGNOSIS — Z79890 Hormone replacement therapy (postmenopausal): Secondary | ICD-10-CM

## 2022-08-22 DIAGNOSIS — R197 Diarrhea, unspecified: Secondary | ICD-10-CM | POA: Insufficient documentation

## 2022-08-22 MED ORDER — ESTRADIOL CYPIONATE 5 MG/ML IM OIL
1.0000 mg | TOPICAL_OIL | Freq: Once | INTRAMUSCULAR | Status: AC
Start: 2022-08-22 — End: 2022-08-22
  Administered 2022-08-22: 1 mg via INTRAMUSCULAR

## 2022-08-22 MED ORDER — TRIAMCINOLONE ACETONIDE 0.1 % EX OINT
1.0000 | TOPICAL_OINTMENT | Freq: Four times a day (QID) | CUTANEOUS | 2 refills | Status: DC | PRN
Start: 2022-08-22 — End: 2024-04-01

## 2022-08-22 MED ORDER — DIPHENOXYLATE-ATROPINE 2.5-0.025 MG PO TABS: 1.0000 | ORAL_TABLET | Freq: Four times a day (QID) | ORAL | 1 refills | Status: DC | PRN

## 2022-08-22 MED ORDER — AMPICILLIN 500 MG PO CAPS: 500.0000 mg | ORAL_CAPSULE | Freq: Four times a day (QID) | ORAL | 1 refills | Status: DC

## 2022-08-22 NOTE — Assessment & Plan Note (Signed)
Worse Use Triamc oint prn

## 2022-08-22 NOTE — Assessment & Plan Note (Signed)
Stable

## 2022-08-22 NOTE — Progress Notes (Signed)
Subjective:  Patient ID: [name], female    DOB: Nov 23, 1945  Age: 77 y.o. MRN: [id number]  CC: Follow-up (3 mnth f/u, STOMACH ACHE, DIARRHEA)   HPI [name] presents for hot flashes Pt is worried about listeria infection from [facility] salad C/o diarrhea, upset stomach x 2 weeks 2-3 times a day w/stool incontinence  Outpatient Medications Prior to Visit  Medication Sig Dispense Refill   aspirin 81 MG EC tablet Take 81 mg by mouth daily.     Cholecalciferol 1000 UNITS tablet Take 1,000 Units by mouth daily.     clobetasol ointment (TEMOVATE) [id number] % Apply 1 application. topically 2 (two) times daily as needed. 30 g 3   cloNIDine (CATAPRES) 0.1 MG tablet Take 1 tablet (0.1 mg total) by mouth 2 (two) times daily as needed (for blood pressure 160/100 or higher). 60 tablet 11   clorazepate (TRANXENE) 15 MG tablet TAKE 1 TABLET BY MOUTH THREE TIMES DAILY AS NEEDED FOR ANXIETY. DO NOT TAKE WITH LORAZEPAM. 270 tablet 0   doxepin (SINEQUAN) 50 MG capsule TAKE 3 CAPSULES BY MOUTH AT BEDTIME 90 capsule 3   estradiol (ESTRACE) 0.1 MG/GM vaginal cream Place 1 Applicatorful vaginally as needed (irritation).     estradiol cypionate (DEPO-ESTRADIOL) 5 MG/ML injection Use 0.3 ml every 18-25 days IM 5 mL 3   furosemide (LASIX) 40 MG tablet TAKE 1 TO 2 TABLETS BY MOUTH ONCE DAILY 180 tablet 3   levothyroxine (SYNTHROID) 100 MCG tablet Start Levothyroxine 100 mcg every other day and 50 mcg every other day. 90 tablet 3   LORazepam (ATIVAN) 0.5 MG tablet TAKE 1 TABLET BY MOUTH AT BEDTIME 90 tablet 1   metoprolol tartrate (LOPRESSOR) 50 MG tablet Take 1.5 tablets (75 mg total) by mouth 3 (three) times daily. 405 tablet 3   NIFEdipine (PROCARDIA XL) 60 MG 24 hr tablet Take 1 tablet (60 mg total) by mouth daily. 90 tablet 3   nitroGLYCERIN (NITROLINGUAL) 0.4 MG/SPRAY spray PLACE 1 SPRAY UNDER THE TONGUE EVERY 5 MINUTES AS NEEDED FOR CHEST PAIN. MAY REPEAT 3 TIMES 4.9 g 6   potassium chloride SA (KLOR-CON M) 20  MEQ tablet Take 1 tablet by mouth once daily 90 tablet 3   No facility-administered medications prior to visit.    ROS: Review of Systems  Constitutional:  Positive for chills, diaphoresis and fatigue. Negative for activity change, appetite change and unexpected weight change.  HENT:  Negative for congestion, mouth sores and sinus pressure.   Eyes:  Negative for visual disturbance.  Respiratory:  Negative for cough and chest tightness.   Gastrointestinal:  Positive for abdominal pain and diarrhea. Negative for nausea and vomiting.  Genitourinary:  Negative for difficulty urinating, frequency and vaginal pain.  Musculoskeletal:  Negative for back pain and gait problem.  Skin:  Negative for pallor and rash.  Neurological:  Negative for dizziness, tremors, weakness, numbness and headaches.  Psychiatric/Behavioral:  Negative for confusion and sleep disturbance.     Objective:  BP (!) 172/90 (BP Location: Left Arm, Patient Position: Sitting, Cuff Size: Normal)   Pulse 76   Temp 98.3 F (36.8 C) (Oral)   Ht 5\' 6"  (1.676 m)   SpO2 96%   BMI 22.76 kg/m   BP Readings from Last 3 Encounters:  08/22/22 (!) 172/90  06/11/22 (!) 180/90  03/28/22 (!) 220/116    Wt Readings from Last 3 Encounters:  06/11/22 141 lb (64 kg)  03/28/22 140 lb (63.5 kg)  03/20/22 139  lb 3.2 oz (63.1 kg)    Physical Exam Constitutional:      General: She is not in acute distress.    Appearance: Normal appearance. She is well-developed.  HENT:     Head: Normocephalic.     Right Ear: External ear normal.     Left Ear: External ear normal.     Nose: Nose normal.  Eyes:     General:        Right eye: No discharge.        Left eye: No discharge.     Conjunctiva/sclera: Conjunctivae normal.     Pupils: Pupils are equal, round, and reactive to light.  Neck:     Thyroid: No thyromegaly.     Vascular: No JVD.     Trachea: No tracheal deviation.  Cardiovascular:     Rate and Rhythm: Normal rate and  regular rhythm.     Heart sounds: Normal heart sounds.  Pulmonary:     Effort: No respiratory distress.     Breath sounds: No stridor. No wheezing.  Abdominal:     General: Bowel sounds are normal. There is no distension.     Palpations: Abdomen is soft. There is no mass.     Tenderness: There is no abdominal tenderness. There is no guarding or rebound.  Musculoskeletal:        General: No tenderness.     Cervical back: Normal range of motion and neck supple. No rigidity.  Lymphadenopathy:     Cervical: No cervical adenopathy.  Skin:    Findings: No erythema or rash.  Neurological:     Cranial Nerves: No cranial nerve deficit.     Motor: No abnormal muscle tone.     Coordination: Coordination normal.     Deep Tendon Reflexes: Reflexes normal.  Psychiatric:        Behavior: Behavior normal.        Thought Content: Thought content normal.        Judgment: Judgment normal.     Lab Results  Component Value Date   WBC 8.3 03/02/2021   HGB 11.5 (L) 03/02/2021   HCT 34.7 (L) 03/02/2021   PLT 282.0 03/02/2021   GLUCOSE 85 03/13/2022   CHOL 235 (H) 12/29/2019   TRIG 100 12/29/2019   HDL 69 12/29/2019   LDLDIRECT 188.2 01/10/2012   LDLCALC 145 (H) 12/29/2019   ALT 10 07/31/2021   AST 16 07/31/2021   NA 144 03/13/2022   K 4.2 03/13/2022   CL 103 03/13/2022   CREATININE 1.21 (H) 03/13/2022   BUN 15 03/13/2022   CO2 26 03/13/2022   TSH <0.005 (L) 03/13/2022   INR 1.0 08/01/2018    No results found.  Assessment & Plan:   Problem List Items Addressed This Visit       Cardiovascular and Mediastinum   Hemorrhoids    Worse Use Triamc oint prn        Other   Hormone replacement therapy (HRT)   Diarrhea - Primary    Pt is worried about listeria infection from [facility] salad C/o diarrhea, upset stomach x 2 weeks 2-3 times a day w/stool incontinence Will use empiric Ampicillin Lomotil prn      Relevant Orders   Clostridium difficile EIA   GI pathogen panel by PCR,  stool   Anxiety disorder    Stable         Meds ordered this encounter  Medications   estradiol cypionate (DEPO-ESTRADIOL) 5 MG/ML injection 1 mg  ampicillin (PRINCIPEN) 500 MG capsule    Sig: Take 1 capsule (500 mg total) by mouth 4 (four) times daily.    Dispense:  28 capsule    Refill:  1    Can take ampicillin   diphenoxylate-atropine (LOMOTIL) 2.5-0.025 MG tablet    Sig: Take 1 tablet by mouth 4 (four) times daily as needed for diarrhea or loose stools.    Dispense:  40 tablet    Refill:  1   triamcinolone ointment (KENALOG) 0.1 %    Sig: Apply 1 Application topically 4 (four) times daily as needed. On hemorrhoids    Dispense:  80 g    Refill:  2      Follow-up: Return in about 4 weeks (around 09/19/2022) for a follow-up visit.  Walker Kehr, MD

## 2022-08-22 NOTE — Addendum Note (Signed)
Addended by: Cassandria Anger on: 08/22/2022 02:57 PM   Modules accepted: Level of Service

## 2022-08-22 NOTE — Assessment & Plan Note (Signed)
Pt is worried about listeria infection from [facility] salad C/o diarrhea, upset stomach x 2 weeks 2-3 times a day w/stool incontinence Will use empiric Ampicillin Lomotil prn

## 2022-08-23 DIAGNOSIS — R197 Diarrhea, unspecified: Secondary | ICD-10-CM | POA: Diagnosis not present

## 2022-08-25 LAB — GASTROINTESTINAL PATHOGEN PNL
CampyloBacter Group: NOT DETECTED
Norovirus GI/GII: NOT DETECTED
Rotavirus A: NOT DETECTED
Salmonella species: NOT DETECTED
Shiga Toxin 1: NOT DETECTED
Shiga Toxin 2: NOT DETECTED
Shigella Species: NOT DETECTED
Vibrio Group: NOT DETECTED
Yersinia enterocolitica: NOT DETECTED

## 2022-08-25 LAB — CLOSTRIDIUM DIFFICILE EIA

## 2022-08-25 LAB — SPECIMEN STATUS REPORT

## 2022-08-27 ENCOUNTER — Encounter: Payer: Self-pay | Admitting: Internal Medicine

## 2022-08-28 ENCOUNTER — Encounter: Payer: Self-pay | Admitting: Internal Medicine

## 2022-08-28 NOTE — Telephone Encounter (Signed)
Duplicate msg.. See previous emails..[name]

## 2022-08-28 NOTE — Telephone Encounter (Signed)
FYI../lmb 

## 2022-08-29 ENCOUNTER — Encounter: Payer: Self-pay | Admitting: Internal Medicine

## 2022-08-30 ENCOUNTER — Other Ambulatory Visit: Payer: Self-pay | Admitting: Internal Medicine

## 2022-08-30 ENCOUNTER — Ambulatory Visit: Payer: Medicare Other | Admitting: Cardiovascular Disease

## 2022-08-30 MED ORDER — METHYLPREDNISOLONE 4 MG PO TBPK: ORAL_TABLET | ORAL | 0 refills | Status: DC

## 2022-09-19 ENCOUNTER — Ambulatory Visit (INDEPENDENT_AMBULATORY_CARE_PROVIDER_SITE_OTHER): Payer: Medicare Other | Admitting: Internal Medicine

## 2022-09-19 ENCOUNTER — Encounter: Payer: Self-pay | Admitting: Internal Medicine

## 2022-09-19 VITALS — BP 118/78 | HR 68 | Temp 98.7°F | Ht 66.0 in | Wt 145.0 lb

## 2022-09-19 DIAGNOSIS — Z79890 Hormone replacement therapy (postmenopausal): Secondary | ICD-10-CM

## 2022-09-19 DIAGNOSIS — R197 Diarrhea, unspecified: Secondary | ICD-10-CM

## 2022-09-19 DIAGNOSIS — I160 Hypertensive urgency: Secondary | ICD-10-CM

## 2022-09-19 DIAGNOSIS — M10072 Idiopathic gout, left ankle and foot: Secondary | ICD-10-CM | POA: Diagnosis not present

## 2022-09-19 DIAGNOSIS — N1831 Chronic kidney disease, stage 3a: Secondary | ICD-10-CM

## 2022-09-19 MED ORDER — ESTRADIOL CYPIONATE 5 MG/ML IM OIL
1.0000 mg | TOPICAL_OIL | Freq: Once | INTRAMUSCULAR | Status: AC
Start: 2022-09-19 — End: 2022-09-19
  Administered 2022-09-19: 1 mg via INTRAMUSCULAR

## 2022-09-19 MED ORDER — DEPO-ESTRADIOL 5 MG/ML IM OIL: TOPICAL_OIL | INTRAMUSCULAR | 3 refills | Status: DC

## 2022-09-19 NOTE — Assessment & Plan Note (Signed)
No relapse  Off Allopurinol

## 2022-09-19 NOTE — Assessment & Plan Note (Signed)
Resolved Lomotil prn - - stopped

## 2022-09-19 NOTE — Assessment & Plan Note (Signed)
Hydrate well.  Continue to monitor GFR 

## 2022-09-19 NOTE — Assessment & Plan Note (Signed)
Nl BP today was noted On Rx

## 2022-09-19 NOTE — Assessment & Plan Note (Signed)
Continue w/Depo-Estradiol 0.3 ml IM monthly

## 2022-09-19 NOTE — Progress Notes (Signed)
Subjective:  Patient ID: [name], female    DOB: January 03, 1946  Age: 77 y.o. MRN: [id number]  CC: Follow-up (4 WEEK F/U)   HPI [name] presents for HRT, diarrhea - no sx's x 2 wks, HTN  Outpatient Medications Prior to Visit  Medication Sig Dispense Refill   ampicillin (PRINCIPEN) 500 MG capsule Take 1 capsule (500 mg total) by mouth 4 (four) times daily. 28 capsule 1   aspirin 81 MG EC tablet Take 81 mg by mouth daily.     Cholecalciferol 1000 UNITS tablet Take 1,000 Units by mouth daily.     clobetasol ointment (TEMOVATE) 0.05 % Apply 1 application. topically 2 (two) times daily as needed. 30 g 3   cloNIDine (CATAPRES) 0.1 MG tablet Take 1 tablet (0.1 mg total) by mouth 2 (two) times daily as needed (for blood pressure 160/100 or higher). 60 tablet 11   clorazepate (TRANXENE) 15 MG tablet TAKE 1 TABLET BY MOUTH THREE TIMES DAILY AS NEEDED FOR ANXIETY. DO NOT TAKE WITH LORAZEPAM. 270 tablet 0   doxepin (SINEQUAN) 50 MG capsule TAKE 3 CAPSULES BY MOUTH AT BEDTIME 90 capsule 3   estradiol (ESTRACE) 0.1 MG/GM vaginal cream Place 1 Applicatorful vaginally as needed (irritation).     furosemide (LASIX) 40 MG tablet TAKE 1 TO 2 TABLETS BY MOUTH ONCE DAILY 180 tablet 3   levothyroxine (SYNTHROID) 100 MCG tablet Start Levothyroxine 100 mcg every other day and 50 mcg every other day. 90 tablet 3   LORazepam (ATIVAN) 0.5 MG tablet TAKE 1 TABLET BY MOUTH AT BEDTIME 90 tablet 1   methylPREDNISolone (MEDROL DOSEPAK) 4 MG TBPK tablet As directed 21 tablet 0   metoprolol tartrate (LOPRESSOR) 50 MG tablet Take 1.5 tablets (75 mg total) by mouth 3 (three) times daily. 405 tablet 3   NIFEdipine (PROCARDIA XL) 60 MG 24 hr tablet Take 1 tablet (60 mg total) by mouth daily. 90 tablet 3   nitroGLYCERIN (NITROLINGUAL) 0.4 MG/SPRAY spray PLACE 1 SPRAY UNDER THE TONGUE EVERY 5 MINUTES AS NEEDED FOR CHEST PAIN. MAY REPEAT 3 TIMES 4.9 g 6   potassium chloride SA (KLOR-CON M) 20 MEQ tablet Take 1 tablet by  mouth once daily 90 tablet 3   triamcinolone ointment (KENALOG) 0.1 % Apply 1 Application topically 4 (four) times daily as needed. On hemorrhoids 80 g 2   diphenoxylate-atropine (LOMOTIL) 2.5-0.025 MG tablet Take 1 tablet by mouth 4 (four) times daily as needed for diarrhea or loose stools. 40 tablet 1   estradiol cypionate (DEPO-ESTRADIOL) 5 MG/ML injection Use 0.3 ml every 18-25 days IM 5 mL 3   No facility-administered medications prior to visit.    ROS: Review of Systems  Constitutional:  Positive for diaphoresis. Negative for activity change, appetite change, chills, fatigue and unexpected weight change.  HENT:  Negative for congestion, mouth sores and sinus pressure.   Eyes:  Negative for visual disturbance.  Respiratory:  Negative for cough and chest tightness.   Gastrointestinal:  Negative for abdominal distention, abdominal pain, diarrhea, nausea and vomiting.  Genitourinary:  Negative for difficulty urinating, frequency and vaginal pain.  Musculoskeletal:  Positive for arthralgias. Negative for back pain and gait problem.  Skin:  Negative for pallor and rash.  Neurological:  Negative for dizziness, tremors, weakness, numbness and headaches.  Psychiatric/Behavioral:  Negative for confusion and sleep disturbance.     Objective:  BP 118/78 (BP Location: Right Arm, Patient Position: Sitting, Cuff Size: Normal)   Pulse 68  Temp 98.7 F (37.1 C) (Oral)   Ht 5\' 6"  (1.676 m)   Wt 145 lb (65.8 kg)   SpO2 94%   BMI 23.40 kg/m   BP Readings from Last 3 Encounters:  09/19/22 118/78  08/22/22 (!) 172/90  06/11/22 (!) 180/90    Wt Readings from Last 3 Encounters:  09/19/22 145 lb (65.8 kg)  06/11/22 141 lb (64 kg)  03/28/22 140 lb (63.5 kg)    Physical Exam Constitutional:      General: She is not in acute distress.    Appearance: Normal appearance. She is well-developed.  HENT:     Head: Normocephalic.     Right Ear: External ear normal.     Left Ear: External ear  normal.     Nose: Nose normal.  Eyes:     General:        Right eye: No discharge.        Left eye: No discharge.     Conjunctiva/sclera: Conjunctivae normal.     Pupils: Pupils are equal, round, and reactive to light.  Neck:     Thyroid: No thyromegaly.     Vascular: No JVD.     Trachea: No tracheal deviation.  Cardiovascular:     Rate and Rhythm: Normal rate and regular rhythm.     Heart sounds: Normal heart sounds.  Pulmonary:     Effort: No respiratory distress.     Breath sounds: No stridor. No wheezing.  Abdominal:     General: Bowel sounds are normal. There is no distension.     Palpations: Abdomen is soft. There is no mass.     Tenderness: There is no abdominal tenderness. There is no guarding or rebound.  Musculoskeletal:        General: No tenderness.     Cervical back: Normal range of motion and neck supple. No rigidity.  Lymphadenopathy:     Cervical: No cervical adenopathy.  Skin:    Findings: No erythema or rash.  Neurological:     Cranial Nerves: No cranial nerve deficit.     Motor: No abnormal muscle tone.     Coordination: Coordination normal.     Deep Tendon Reflexes: Reflexes normal.  Psychiatric:        Behavior: Behavior normal.        Thought Content: Thought content normal.        Judgment: Judgment normal.     Lab Results  Component Value Date   WBC 8.3 03/02/2021   HGB 11.5 (L) 03/02/2021   HCT 34.7 (L) 03/02/2021   PLT 282.0 03/02/2021   GLUCOSE 85 03/13/2022   CHOL 235 (H) 12/29/2019   TRIG 100 12/29/2019   HDL 69 12/29/2019   LDLDIRECT 188.2 01/10/2012   LDLCALC 145 (H) 12/29/2019   ALT 10 07/31/2021   AST 16 07/31/2021   NA 144 03/13/2022   K 4.2 03/13/2022   CL 103 03/13/2022   CREATININE 1.21 (H) 03/13/2022   BUN 15 03/13/2022   CO2 26 03/13/2022   TSH <0.005 (L) 03/13/2022   INR 1.0 08/01/2018    No results found.  Assessment & Plan:   Problem List Items Addressed This Visit     Gout    No relapse  Off  Allopurinol       Hormone replacement therapy (HRT) - Primary    Continue w/Depo-Estradiol 0.3 ml IM monthly      Stage 3a chronic kidney disease (HCC)    Hydrate well Continue to  monitor GFR      Hypertensive urgency    Nl BP today was noted On Rx      Diarrhea    Resolved Lomotil prn - - stopped         Meds ordered this encounter  Medications   estradiol cypionate (DEPO-ESTRADIOL) 5 MG/ML injection 1 mg   estradiol cypionate (DEPO-ESTRADIOL) 5 MG/ML injection    Sig: Use 0.3 ml every 18-25 days IM    Dispense:  5 mL    Refill:  3      Follow-up: Return in about 4 weeks (around 10/17/2022) for a follow-up visit.  Sonda Primes, MD

## 2022-10-24 ENCOUNTER — Encounter: Payer: Self-pay | Admitting: Internal Medicine

## 2022-10-24 ENCOUNTER — Ambulatory Visit (INDEPENDENT_AMBULATORY_CARE_PROVIDER_SITE_OTHER): Payer: Medicare Other | Admitting: Internal Medicine

## 2022-10-24 VITALS — BP 120/78 | HR 73 | Temp 98.2°F | Ht 66.0 in | Wt 145.0 lb

## 2022-10-24 DIAGNOSIS — F419 Anxiety disorder, unspecified: Secondary | ICD-10-CM | POA: Diagnosis not present

## 2022-10-24 DIAGNOSIS — I25119 Atherosclerotic heart disease of native coronary artery with unspecified angina pectoris: Secondary | ICD-10-CM | POA: Diagnosis not present

## 2022-10-24 DIAGNOSIS — Z79890 Hormone replacement therapy (postmenopausal): Secondary | ICD-10-CM | POA: Diagnosis not present

## 2022-10-24 DIAGNOSIS — N8110 Cystocele, unspecified: Secondary | ICD-10-CM

## 2022-10-24 DIAGNOSIS — N951 Menopausal and female climacteric states: Secondary | ICD-10-CM | POA: Diagnosis not present

## 2022-10-24 DIAGNOSIS — N1831 Chronic kidney disease, stage 3a: Secondary | ICD-10-CM

## 2022-10-24 DIAGNOSIS — E034 Atrophy of thyroid (acquired): Secondary | ICD-10-CM

## 2022-10-24 MED ORDER — CLORAZEPATE DIPOTASSIUM 15 MG PO TABS: ORAL_TABLET | ORAL | 0 refills | Status: DC

## 2022-10-24 MED ORDER — ESTRADIOL 0.1 MG/GM VA CREA: 1.0000 | TOPICAL_CREAM | VAGINAL | 3 refills | Status: DC | PRN

## 2022-10-24 MED ORDER — ESTRADIOL CYPIONATE 5 MG/ML IM OIL
1.0000 mg | TOPICAL_OIL | Freq: Once | INTRAMUSCULAR | Status: DC
Start: 2022-10-24 — End: 2022-10-25

## 2022-10-24 NOTE — Progress Notes (Signed)
Subjective:  Patient ID: [name], female    DOB: 01-15-1946  Age: 77 y.o. MRN: [id number]  CC: Follow-up (4 week f/u)   HPI [name] presents for HRT, edema, gout, anxiety  Outpatient Medications Prior to Visit  Medication Sig Dispense Refill   ampicillin (PRINCIPEN) 500 MG capsule Take 1 capsule (500 mg total) by mouth 4 (four) times daily. 28 capsule 1   aspirin 81 MG EC tablet Take 81 mg by mouth daily.     Cholecalciferol 1000 UNITS tablet Take 1,000 Units by mouth daily.     clobetasol ointment (TEMOVATE) 0.05 % Apply 1 application. topically 2 (two) times daily as needed. 30 g 3   cloNIDine (CATAPRES) 0.1 MG tablet Take 1 tablet (0.1 mg total) by mouth 2 (two) times daily as needed (for blood pressure 160/100 or higher). 60 tablet 11   clorazepate (TRANXENE) 15 MG tablet TAKE 1 TABLET BY MOUTH THREE TIMES DAILY AS NEEDED FOR ANXIETY. DO NOT TAKE WITH LORAZEPAM. 270 tablet 0   doxepin (SINEQUAN) 50 MG capsule TAKE 3 CAPSULES BY MOUTH AT BEDTIME 90 capsule 3   estradiol (ESTRACE) 0.1 MG/GM vaginal cream Place 1 Applicatorful vaginally as needed (irritation).     estradiol cypionate (DEPO-ESTRADIOL) 5 MG/ML injection Use 0.3 ml every 18-25 days IM 5 mL 3   furosemide (LASIX) 40 MG tablet TAKE 1 TO 2 TABLETS BY MOUTH ONCE DAILY 180 tablet 3   levothyroxine (SYNTHROID) 100 MCG tablet Start Levothyroxine 100 mcg every other day and 50 mcg every other day. 90 tablet 3   LORazepam (ATIVAN) 0.5 MG tablet TAKE 1 TABLET BY MOUTH AT BEDTIME 90 tablet 1   methylPREDNISolone (MEDROL DOSEPAK) 4 MG TBPK tablet As directed 21 tablet 0   metoprolol tartrate (LOPRESSOR) 50 MG tablet Take 1.5 tablets (75 mg total) by mouth 3 (three) times daily. 405 tablet 3   NIFEdipine (PROCARDIA XL) 60 MG 24 hr tablet Take 1 tablet (60 mg total) by mouth daily. 90 tablet 3   nitroGLYCERIN (NITROLINGUAL) 0.4 MG/SPRAY spray PLACE 1 SPRAY UNDER THE TONGUE EVERY 5 MINUTES AS NEEDED FOR CHEST PAIN. MAY REPEAT 3  TIMES 4.9 g 6   potassium chloride SA (KLOR-CON M) 20 MEQ tablet Take 1 tablet by mouth once daily 90 tablet 3   triamcinolone ointment (KENALOG) 0.1 % Apply 1 Application topically 4 (four) times daily as needed. On hemorrhoids 80 g 2   No facility-administered medications prior to visit.    ROS: Review of Systems  Constitutional:  Negative for activity change, appetite change, chills, fatigue and unexpected weight change.  HENT:  Negative for congestion, mouth sores and sinus pressure.   Eyes:  Negative for visual disturbance.  Respiratory:  Negative for cough and chest tightness.   Cardiovascular:  Positive for leg swelling.  Gastrointestinal:  Negative for abdominal pain and nausea.  Genitourinary:  Negative for difficulty urinating, frequency and vaginal pain.  Musculoskeletal:  Positive for arthralgias. Negative for back pain and gait problem.  Skin:  Negative for pallor and rash.  Neurological:  Negative for dizziness, tremors, weakness, numbness and headaches.  Psychiatric/Behavioral:  Positive for dysphoric mood and sleep disturbance. Negative for confusion and suicidal ideas. The patient is nervous/anxious.     Objective:  BP 120/78 (BP Location: Left Arm, Patient Position: Sitting, Cuff Size: Large)   Pulse 73   Temp 98.2 F (36.8 C) (Oral)   Ht 5\' 6"  (1.676 m)   Wt 145 lb (65.8 kg)  SpO2 96%   BMI 23.40 kg/m   BP Readings from Last 3 Encounters:  10/24/22 120/78  09/19/22 118/78  08/22/22 (!) 172/90    Wt Readings from Last 3 Encounters:  10/24/22 145 lb (65.8 kg)  09/19/22 145 lb (65.8 kg)  06/11/22 141 lb (64 kg)    Physical Exam Constitutional:      General: She is not in acute distress.    Appearance: Normal appearance. She is well-developed.  HENT:     Head: Normocephalic.     Right Ear: External ear normal.     Left Ear: External ear normal.     Nose: Nose normal.  Eyes:     General:        Right eye: No discharge.        Left eye: No  discharge.     Conjunctiva/sclera: Conjunctivae normal.     Pupils: Pupils are equal, round, and reactive to light.  Neck:     Thyroid: No thyromegaly.     Vascular: No JVD.     Trachea: No tracheal deviation.  Cardiovascular:     Rate and Rhythm: Normal rate and regular rhythm.     Heart sounds: Normal heart sounds.  Pulmonary:     Effort: No respiratory distress.     Breath sounds: No stridor. No wheezing.  Abdominal:     General: Bowel sounds are normal. There is no distension.     Palpations: Abdomen is soft. There is no mass.     Tenderness: There is no abdominal tenderness. There is no guarding or rebound.  Musculoskeletal:        General: No tenderness.     Cervical back: Normal range of motion and neck supple. No rigidity.  Lymphadenopathy:     Cervical: No cervical adenopathy.  Skin:    Findings: No erythema or rash.  Neurological:     Cranial Nerves: No cranial nerve deficit.     Motor: No abnormal muscle tone.     Coordination: Coordination normal.     Deep Tendon Reflexes: Reflexes normal.  Psychiatric:        Behavior: Behavior normal.        Thought Content: Thought content normal.        Judgment: Judgment normal.     Lab Results  Component Value Date   WBC 8.3 03/02/2021   HGB 11.5 (L) 03/02/2021   HCT 34.7 (L) 03/02/2021   PLT 282.0 03/02/2021   GLUCOSE 85 03/13/2022   CHOL 235 (H) 12/29/2019   TRIG 100 12/29/2019   HDL 69 12/29/2019   LDLDIRECT 188.2 01/10/2012   LDLCALC 145 (H) 12/29/2019   ALT 10 07/31/2021   AST 16 07/31/2021   NA 144 03/13/2022   K 4.2 03/13/2022   CL 103 03/13/2022   CREATININE 1.21 (H) 03/13/2022   BUN 15 03/13/2022   CO2 26 03/13/2022   TSH <0.005 (L) 03/13/2022   INR 1.0 08/01/2018    No results found.  Assessment & Plan:   Problem List Items Addressed This Visit     Hormone replacement therapy (HRT) - Primary   Relevant Medications   estradiol cypionate (DEPO-ESTRADIOL) 5 MG/ML injection 1 mg      Meds  ordered this encounter  Medications   estradiol cypionate (DEPO-ESTRADIOL) 5 MG/ML injection 1 mg      Follow-up: No follow-ups on file.  Sonda Primes, MD

## 2022-10-24 NOTE — Assessment & Plan Note (Signed)
F/u w/Dr Apolinar Junes if worse

## 2022-10-24 NOTE — Assessment & Plan Note (Signed)
Start Levothyroxine 100 mcg every other day and 50 mcg every other day. 
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
## 2022-10-24 NOTE — Assessment & Plan Note (Signed)
Hydrate well.  Continue to monitor GFR 

## 2022-10-24 NOTE — Assessment & Plan Note (Signed)
Chronic. No angina Cont on Toprol, Procardia

## 2022-10-24 NOTE — Assessment & Plan Note (Signed)
On HRT monthly IM

## 2022-10-25 NOTE — Addendum Note (Signed)
Addended by: Delsa Grana R on: 10/25/2022 04:09 PM   Modules accepted: Orders

## 2022-11-21 ENCOUNTER — Encounter: Payer: Self-pay | Admitting: Internal Medicine

## 2022-11-21 ENCOUNTER — Ambulatory Visit (INDEPENDENT_AMBULATORY_CARE_PROVIDER_SITE_OTHER): Payer: Medicare Other | Admitting: Internal Medicine

## 2022-11-21 VITALS — BP 120/68 | HR 60 | Temp 97.9°F | Ht 66.0 in | Wt 145.0 lb

## 2022-11-21 DIAGNOSIS — Z79890 Hormone replacement therapy (postmenopausal): Secondary | ICD-10-CM | POA: Diagnosis not present

## 2022-11-21 DIAGNOSIS — E039 Hypothyroidism, unspecified: Secondary | ICD-10-CM | POA: Diagnosis not present

## 2022-11-21 DIAGNOSIS — R197 Diarrhea, unspecified: Secondary | ICD-10-CM

## 2022-11-21 DIAGNOSIS — F5101 Primary insomnia: Secondary | ICD-10-CM

## 2022-11-21 DIAGNOSIS — F419 Anxiety disorder, unspecified: Secondary | ICD-10-CM

## 2022-11-21 DIAGNOSIS — N1831 Chronic kidney disease, stage 3a: Secondary | ICD-10-CM

## 2022-11-21 DIAGNOSIS — E034 Atrophy of thyroid (acquired): Secondary | ICD-10-CM

## 2022-11-21 MED ORDER — ESTRADIOL CYPIONATE 5 MG/ML IM OIL
1.0000 mg | TOPICAL_OIL | Freq: Once | INTRAMUSCULAR | Status: AC
Start: 2022-11-21 — End: 2022-11-21
  Administered 2022-11-21: 1 mg via INTRAMUSCULAR

## 2022-11-21 NOTE — Assessment & Plan Note (Signed)
Hydrate well.  Continue to monitor GFR 

## 2022-11-21 NOTE — Progress Notes (Signed)
Subjective:  Patient ID: [name], female    DOB: November 06, 1945  Age: 77 y.o. MRN: [id number]  CC: Follow-up (4 week f/u)   HPI [name] presents for HRT, hypothyroidism, gout  Outpatient Medications Prior to Visit  Medication Sig Dispense Refill   ampicillin (PRINCIPEN) 500 MG capsule Take 1 capsule (500 mg total) by mouth 4 (four) times daily. 28 capsule 1   aspirin 81 MG EC tablet Take 81 mg by mouth daily.     Cholecalciferol 1000 UNITS tablet Take 1,000 Units by mouth daily.     clobetasol ointment (TEMOVATE) 0.05 % Apply 1 application. topically 2 (two) times daily as needed. 30 g 3   cloNIDine (CATAPRES) 0.1 MG tablet Take 1 tablet (0.1 mg total) by mouth 2 (two) times daily as needed (for blood pressure 160/100 or higher). 60 tablet 11   clorazepate (TRANXENE) 15 MG tablet TAKE 1 TABLET BY MOUTH THREE TIMES DAILY AS NEEDED FOR ANXIETY. DO NOT TAKE WITH LORAZEPAM. 270 tablet 0   doxepin (SINEQUAN) 50 MG capsule TAKE 3 CAPSULES BY MOUTH AT BEDTIME 90 capsule 3   estradiol (ESTRACE) 0.1 MG/GM vaginal cream Place 1 Applicatorful vaginally as needed (irritation). 42.5 g 3   estradiol cypionate (DEPO-ESTRADIOL) 5 MG/ML injection Use 0.3 ml every 18-25 days IM 5 mL 3   furosemide (LASIX) 40 MG tablet TAKE 1 TO 2 TABLETS BY MOUTH ONCE DAILY 180 tablet 3   levothyroxine (SYNTHROID) 100 MCG tablet Start Levothyroxine 100 mcg every other day and 50 mcg every other day. 90 tablet 3   LORazepam (ATIVAN) 0.5 MG tablet TAKE 1 TABLET BY MOUTH AT BEDTIME 90 tablet 1   methylPREDNISolone (MEDROL DOSEPAK) 4 MG TBPK tablet As directed 21 tablet 0   metoprolol tartrate (LOPRESSOR) 50 MG tablet Take 1.5 tablets (75 mg total) by mouth 3 (three) times daily. 405 tablet 3   NIFEdipine (PROCARDIA XL) 60 MG 24 hr tablet Take 1 tablet (60 mg total) by mouth daily. 90 tablet 3   nitroGLYCERIN (NITROLINGUAL) 0.4 MG/SPRAY spray PLACE 1 SPRAY UNDER THE TONGUE EVERY 5 MINUTES AS NEEDED FOR CHEST PAIN. MAY  REPEAT 3 TIMES 4.9 g 6   potassium chloride SA (KLOR-CON M) 20 MEQ tablet Take 1 tablet by mouth once daily 90 tablet 3   triamcinolone ointment (KENALOG) 0.1 % Apply 1 Application topically 4 (four) times daily as needed. On hemorrhoids 80 g 2   No facility-administered medications prior to visit.    ROS: Review of Systems  Constitutional:  Negative for activity change, appetite change, chills, fatigue and unexpected weight change.  HENT:  Negative for congestion, mouth sores and sinus pressure.   Eyes:  Negative for visual disturbance.  Respiratory:  Negative for cough and chest tightness.   Gastrointestinal:  Positive for diarrhea. Negative for abdominal pain and nausea.  Genitourinary:  Negative for difficulty urinating, frequency and vaginal pain.  Musculoskeletal:  Negative for back pain (HRT) and gait problem.  Skin:  Negative for pallor and rash.  Neurological:  Negative for dizziness, tremors, weakness, numbness and headaches.  Psychiatric/Behavioral:  Positive for sleep disturbance. Negative for confusion and suicidal ideas. The patient is nervous/anxious.     Objective:  BP 120/68 (BP Location: Right Arm, Patient Position: Sitting, Cuff Size: Normal)   Pulse 60   Temp 97.9 F (36.6 C) (Oral)   Ht 5\' 6"  (1.676 m)   Wt 145 lb (65.8 kg)   SpO2 95%   BMI 23.40 kg/m  BP Readings from Last 3 Encounters:  11/21/22 120/68  10/24/22 120/78  09/19/22 118/78    Wt Readings from Last 3 Encounters:  11/21/22 145 lb (65.8 kg)  10/24/22 145 lb (65.8 kg)  09/19/22 145 lb (65.8 kg)    Physical Exam Constitutional:      General: She is not in acute distress.    Appearance: Normal appearance. She is well-developed.  HENT:     Head: Normocephalic.     Right Ear: External ear normal.     Left Ear: External ear normal.     Nose: Nose normal.  Eyes:     General:        Right eye: No discharge.        Left eye: No discharge.     Conjunctiva/sclera: Conjunctivae normal.      Pupils: Pupils are equal, round, and reactive to light.  Neck:     Thyroid: No thyromegaly.     Vascular: No JVD.     Trachea: No tracheal deviation.  Cardiovascular:     Rate and Rhythm: Normal rate and regular rhythm.     Heart sounds: Normal heart sounds.  Pulmonary:     Effort: No respiratory distress.     Breath sounds: No stridor. No wheezing.  Abdominal:     General: Bowel sounds are normal. There is no distension.     Palpations: Abdomen is soft. There is no mass.     Tenderness: There is no abdominal tenderness. There is no guarding or rebound.  Musculoskeletal:        General: No tenderness.     Cervical back: Normal range of motion and neck supple. No rigidity.  Lymphadenopathy:     Cervical: No cervical adenopathy.  Skin:    Findings: No erythema or rash.  Neurological:     Mental Status: She is oriented to person, place, and time.     Cranial Nerves: No cranial nerve deficit.     Motor: No abnormal muscle tone.     Coordination: Coordination normal.     Deep Tendon Reflexes: Reflexes normal.  Psychiatric:        Behavior: Behavior normal.        Thought Content: Thought content normal.        Judgment: Judgment normal.   R hand dog bites - small Trace edema - feet  Lab Results  Component Value Date   WBC 8.3 03/02/2021   HGB 11.5 (L) 03/02/2021   HCT 34.7 (L) 03/02/2021   PLT 282.0 03/02/2021   GLUCOSE 85 03/13/2022   CHOL 235 (H) 12/29/2019   TRIG 100 12/29/2019   HDL 69 12/29/2019   LDLDIRECT 188.2 01/10/2012   LDLCALC 145 (H) 12/29/2019   ALT 10 07/31/2021   AST 16 07/31/2021   NA 144 03/13/2022   K 4.2 03/13/2022   CL 103 03/13/2022   CREATININE 1.21 (H) 03/13/2022   BUN 15 03/13/2022   CO2 26 03/13/2022   TSH <0.005 (L) 03/13/2022   INR 1.0 08/01/2018    No results found.  Assessment & Plan:   Problem List Items Addressed This Visit     Hypothyroidism    Start Levothyroxine 100 mcg every other day and 50 mcg every other day.       Anxiety disorder    Tranzene - rare use  Potential benefits of a long term benzodiazepines  use as well as potential risks  and complications were explained to the patient and were aknowledged.  Hormone replacement therapy (HRT) - Primary    Continue w/Depo-Estradiol 0.3 ml IM monthly      Insomnia    Doxepin at hs      Stage 3a chronic kidney disease (HCC)    Hydrate well Continue to monitor GFR      Diarrhea    Recurrent Lomotil prn         Meds ordered this encounter  Medications   estradiol cypionate (DEPO-ESTRADIOL) 5 MG/ML injection 1 mg      Follow-up: Return in about 4 weeks (around 12/19/2022) for a follow-up visit.  Sonda Primes, MD

## 2022-11-21 NOTE — Assessment & Plan Note (Signed)
Doxepin at hs 

## 2022-11-21 NOTE — Assessment & Plan Note (Signed)
Tranzene - rare use  Potential benefits of a long term benzodiazepines  use as well as potential risks  and complications were explained to the patient and were aknowledged.

## 2022-11-21 NOTE — Assessment & Plan Note (Signed)
Start Levothyroxine 100 mcg every other day and 50 mcg every other day. 

## 2022-11-21 NOTE — Assessment & Plan Note (Signed)
Continue w/Depo-Estradiol 0.3 ml IM monthly 

## 2022-11-21 NOTE — Assessment & Plan Note (Signed)
Recurrent Lomotil prn

## 2022-12-10 ENCOUNTER — Ambulatory Visit: Payer: Medicare Other | Admitting: Cardiovascular Disease

## 2022-12-18 ENCOUNTER — Ambulatory Visit (INDEPENDENT_AMBULATORY_CARE_PROVIDER_SITE_OTHER): Payer: Medicare Other | Admitting: Internal Medicine

## 2022-12-18 ENCOUNTER — Encounter: Payer: Self-pay | Admitting: Internal Medicine

## 2022-12-18 VITALS — BP 120/70 | HR 68 | Temp 98.6°F | Ht 66.0 in | Wt 145.0 lb

## 2022-12-18 DIAGNOSIS — Z79890 Hormone replacement therapy (postmenopausal): Secondary | ICD-10-CM | POA: Diagnosis not present

## 2022-12-18 DIAGNOSIS — N952 Postmenopausal atrophic vaginitis: Secondary | ICD-10-CM | POA: Diagnosis not present

## 2022-12-18 MED ORDER — ESTRADIOL CYPIONATE 5 MG/ML IM OIL
1.0000 mg | TOPICAL_OIL | Freq: Once | INTRAMUSCULAR | Status: AC
Start: 2022-12-18 — End: 2022-12-18
  Administered 2022-12-18: 1 mg via INTRAMUSCULAR

## 2022-12-18 NOTE — Assessment & Plan Note (Signed)
Using a personal gel  On HRT shots

## 2022-12-18 NOTE — Progress Notes (Signed)
Subjective:  Patient ID: [name], female    DOB: 1946/05/12  Age: 77 y.o. MRN: [id number]  CC: Follow-up (4 WEEK F/U)   HPI [name] presents for HRT  Outpatient Medications Prior to Visit  Medication Sig Dispense Refill   ampicillin (PRINCIPEN) 500 MG capsule Take 1 capsule (500 mg total) by mouth 4 (four) times daily. 28 capsule 1   aspirin 81 MG EC tablet Take 81 mg by mouth daily.     Cholecalciferol 1000 UNITS tablet Take 1,000 Units by mouth daily.     clobetasol ointment (TEMOVATE) 0.05 % Apply 1 application. topically 2 (two) times daily as needed. 30 g 3   cloNIDine (CATAPRES) 0.1 MG tablet Take 1 tablet (0.1 mg total) by mouth 2 (two) times daily as needed (for blood pressure 160/100 or higher). 60 tablet 11   clorazepate (TRANXENE) 15 MG tablet TAKE 1 TABLET BY MOUTH THREE TIMES DAILY AS NEEDED FOR ANXIETY. DO NOT TAKE WITH LORAZEPAM. 270 tablet 0   doxepin (SINEQUAN) 50 MG capsule TAKE 3 CAPSULES BY MOUTH AT BEDTIME 90 capsule 3   estradiol (ESTRACE) 0.1 MG/GM vaginal cream Place 1 Applicatorful vaginally as needed (irritation). 42.5 g 3   estradiol cypionate (DEPO-ESTRADIOL) 5 MG/ML injection Use 0.3 ml every 18-25 days IM 5 mL 3   furosemide (LASIX) 40 MG tablet TAKE 1 TO 2 TABLETS BY MOUTH ONCE DAILY 180 tablet 3   levothyroxine (SYNTHROID) 100 MCG tablet Start Levothyroxine 100 mcg every other day and 50 mcg every other day. 90 tablet 3   LORazepam (ATIVAN) 0.5 MG tablet TAKE 1 TABLET BY MOUTH AT BEDTIME 90 tablet 1   methylPREDNISolone (MEDROL DOSEPAK) 4 MG TBPK tablet As directed 21 tablet 0   metoprolol tartrate (LOPRESSOR) 50 MG tablet Take 1.5 tablets (75 mg total) by mouth 3 (three) times daily. 405 tablet 3   NIFEdipine (PROCARDIA XL) 60 MG 24 hr tablet Take 1 tablet (60 mg total) by mouth daily. 90 tablet 3   nitroGLYCERIN (NITROLINGUAL) 0.4 MG/SPRAY spray PLACE 1 SPRAY UNDER THE TONGUE EVERY 5 MINUTES AS NEEDED FOR CHEST PAIN. MAY REPEAT 3 TIMES 4.9 g 6    potassium chloride SA (KLOR-CON M) 20 MEQ tablet Take 1 tablet by mouth once daily 90 tablet 3   triamcinolone ointment (KENALOG) 0.1 % Apply 1 Application topically 4 (four) times daily as needed. On hemorrhoids 80 g 2   No facility-administered medications prior to visit.    ROS: Review of Systems  Constitutional:  Negative for activity change and diaphoresis.  Musculoskeletal:  Negative for arthralgias.    Objective:  BP 120/70 (BP Location: Right Arm, Patient Position: Sitting, Cuff Size: Large)   Pulse 68   Temp 98.6 F (37 C) (Oral)   Ht 5\' 6"  (1.676 m)   Wt 145 lb (65.8 kg)   SpO2 95%   BMI 23.40 kg/m   BP Readings from Last 3 Encounters:  12/18/22 120/70  11/21/22 120/68  10/24/22 120/78    Wt Readings from Last 3 Encounters:  12/18/22 145 lb (65.8 kg)  11/21/22 145 lb (65.8 kg)  10/24/22 145 lb (65.8 kg)    Physical Exam Constitutional:      Appearance: Normal appearance.  Musculoskeletal:        General: No tenderness.     Right lower leg: No edema.     Left lower leg: No edema.  Neurological:     Mental Status: She is oriented to person, place,  and time.     Lab Results  Component Value Date   WBC 8.3 03/02/2021   HGB 11.5 (L) 03/02/2021   HCT 34.7 (L) 03/02/2021   PLT 282.0 03/02/2021   GLUCOSE 85 03/13/2022   CHOL 235 (H) 12/29/2019   TRIG 100 12/29/2019   HDL 69 12/29/2019   LDLDIRECT 188.2 01/10/2012   LDLCALC 145 (H) 12/29/2019   ALT 10 07/31/2021   AST 16 07/31/2021   NA 144 03/13/2022   K 4.2 03/13/2022   CL 103 03/13/2022   CREATININE 1.21 (H) 03/13/2022   BUN 15 03/13/2022   CO2 26 03/13/2022   TSH <0.005 (L) 03/13/2022   INR 1.0 08/01/2018    No results found.  Assessment & Plan:   Problem List Items Addressed This Visit     Hormone replacement therapy (HRT) - Primary    Chronic HRT estradiol injections for severe hot flashes Continue w/Depo-Estradiol 0.3 ml IM monthly      Atrophic vaginitis    Using a personal  gel  On HRT shots         Meds ordered this encounter  Medications   estradiol cypionate (DEPO-ESTRADIOL) 5 MG/ML injection 1 mg      Follow-up: No follow-ups on file.  Sonda Primes, MD

## 2022-12-18 NOTE — Assessment & Plan Note (Signed)
Chronic HRT estradiol injections for severe hot flashes Continue w/Depo-Estradiol 0.3 ml IM monthly

## 2022-12-19 ENCOUNTER — Encounter (INDEPENDENT_AMBULATORY_CARE_PROVIDER_SITE_OTHER): Payer: Self-pay

## 2023-01-17 ENCOUNTER — Encounter: Payer: Self-pay | Admitting: Internal Medicine

## 2023-01-17 ENCOUNTER — Ambulatory Visit (INDEPENDENT_AMBULATORY_CARE_PROVIDER_SITE_OTHER): Payer: Medicare Other | Admitting: Internal Medicine

## 2023-01-17 VITALS — BP 140/90 | HR 60 | Temp 98.3°F | Ht 66.0 in | Wt 151.0 lb

## 2023-01-17 DIAGNOSIS — Z79890 Hormone replacement therapy (postmenopausal): Secondary | ICD-10-CM

## 2023-01-17 MED ORDER — ESTRADIOL CYPIONATE 5 MG/ML IM OIL
1.0000 mg | TOPICAL_OIL | Freq: Once | INTRAMUSCULAR | Status: AC
Start: 2023-01-17 — End: 2023-01-17
  Administered 2023-01-17: 1 mg via INTRAMUSCULAR

## 2023-01-24 NOTE — Assessment & Plan Note (Signed)
 Chronic HRT estradiol injections for severe hot flashes Continue w/Depo-Estradiol 0.3 ml IM monthly

## 2023-01-24 NOTE — Progress Notes (Signed)
Subjective:  Patient ID: KY HARPOLE, female    DOB: 1945-06-25  Age: 77 y.o. MRN: [id number]  CC: Follow-up (4 Week f/u)   HPI Cedar C Deschamp presents for heart flashes  Outpatient Medications Prior to Visit  Medication Sig Dispense Refill   ampicillin (PRINCIPEN) 500 MG capsule Take 1 capsule (500 mg total) by mouth 4 (four) times daily. 28 capsule 1   aspirin 81 MG EC tablet Take 81 mg by mouth daily.     Cholecalciferol 1000 UNITS tablet Take 1,000 Units by mouth daily.     clobetasol ointment (TEMOVATE) 0.05 % Apply 1 application. topically 2 (two) times daily as needed. 30 g 3   cloNIDine (CATAPRES) 0.1 MG tablet Take 1 tablet (0.1 mg total) by mouth 2 (two) times daily as needed (for blood pressure 160/100 or higher). 60 tablet 11   clorazepate (TRANXENE) 15 MG tablet TAKE 1 TABLET BY MOUTH THREE TIMES DAILY AS NEEDED FOR ANXIETY. DO NOT TAKE WITH LORAZEPAM. 270 tablet 0   doxepin (SINEQUAN) 50 MG capsule TAKE 3 CAPSULES BY MOUTH AT BEDTIME 90 capsule 3   estradiol (ESTRACE) 0.1 MG/GM vaginal cream Place 1 Applicatorful vaginally as needed (irritation). 42.5 g 3   estradiol cypionate (DEPO-ESTRADIOL) 5 MG/ML injection Use 0.3 ml every 18-25 days IM 5 mL 3   furosemide (LASIX) 40 MG tablet TAKE 1 TO 2 TABLETS BY MOUTH ONCE DAILY 180 tablet 3   levothyroxine (SYNTHROID) 100 MCG tablet Start Levothyroxine 100 mcg every other day and 50 mcg every other day. 90 tablet 3   LORazepam (ATIVAN) 0.5 MG tablet TAKE 1 TABLET BY MOUTH AT BEDTIME 90 tablet 1   methylPREDNISolone (MEDROL DOSEPAK) 4 MG TBPK tablet As directed 21 tablet 0   metoprolol tartrate (LOPRESSOR) 50 MG tablet Take 1.5 tablets (75 mg total) by mouth 3 (three) times daily. 405 tablet 3   NIFEdipine (PROCARDIA XL) 60 MG 24 hr tablet Take 1 tablet (60 mg total) by mouth daily. 90 tablet 3   nitroGLYCERIN (NITROLINGUAL) 0.4 MG/SPRAY spray PLACE 1 SPRAY UNDER THE TONGUE EVERY 5 MINUTES AS NEEDED FOR CHEST PAIN. MAY REPEAT 3 TIMES  4.9 g 6   potassium chloride SA (KLOR-CON M) 20 MEQ tablet Take 1 tablet by mouth once daily 90 tablet 3   triamcinolone ointment (KENALOG) 0.1 % Apply 1 Application topically 4 (four) times daily as needed. On hemorrhoids 80 g 2   No facility-administered medications prior to visit.    ROS: Review of Systems  Constitutional:  Negative for fever and unexpected weight change.  Musculoskeletal:  Positive for arthralgias.    Objective:  BP (!) 140/90 (BP Location: Right Arm, Patient Position: Sitting, Cuff Size: Normal)   Pulse 60   Temp 98.3 F (36.8 C) (Oral)   Ht 5\' 6"  (1.676 m)   Wt 151 lb (68.5 kg)   SpO2 92%   BMI 24.37 kg/m   BP Readings from Last 3 Encounters:  01/17/23 (!) 140/90  12/18/22 120/70  11/21/22 120/68    Wt Readings from Last 3 Encounters:  01/17/23 151 lb (68.5 kg)  12/18/22 145 lb (65.8 kg)  11/21/22 145 lb (65.8 kg)    Physical Exam Constitutional:      Appearance: Normal appearance.  Neurological:     Mental Status: She is oriented to person, place, and time.  Psychiatric:        Behavior: Behavior normal.        Judgment: Judgment normal.  Lab Results  Component Value Date   WBC 8.3 03/02/2021   HGB 11.5 (L) 03/02/2021   HCT 34.7 (L) 03/02/2021   PLT 282.0 03/02/2021   GLUCOSE 85 03/13/2022   CHOL 235 (H) 12/29/2019   TRIG 100 12/29/2019   HDL 69 12/29/2019   LDLDIRECT 188.2 01/10/2012   LDLCALC 145 (H) 12/29/2019   ALT 10 07/31/2021   AST 16 07/31/2021   NA 144 03/13/2022   K 4.2 03/13/2022   CL 103 03/13/2022   CREATININE 1.21 (H) 03/13/2022   BUN 15 03/13/2022   CO2 26 03/13/2022   TSH <0.005 (L) 03/13/2022   INR 1.0 08/01/2018    No results found.  Assessment & Plan:   Problem List Items Addressed This Visit     Hormone replacement therapy (HRT) - Primary    Chronic HRT estradiol injections for severe hot flashes Continue w/Depo-Estradiol 0.3 ml IM monthly         Meds ordered this encounter   Medications   estradiol cypionate (DEPO-ESTRADIOL) 5 MG/ML injection 1 mg      Follow-up: No follow-ups on file.  Sonda Primes, MD

## 2023-02-01 ENCOUNTER — Other Ambulatory Visit: Payer: Self-pay | Admitting: Internal Medicine

## 2023-02-05 ENCOUNTER — Telehealth: Payer: Self-pay | Admitting: Internal Medicine

## 2023-02-05 NOTE — Telephone Encounter (Signed)
Prescription Request  02/05/2023  LOV: 01/17/2023  What is the name of the medication or equipment? LORazepam (ATIVAN) 0.5 MG tablet   Have you contacted your pharmacy to request a refill? Yes   Which pharmacy would you like this sent to?  [facility] [address] [place] - [address] [place] [address] Phone: [phone] Fax: [phone]    Patient notified that their request is being sent to the clinical staff for review and that they should receive a response within 2 business days.   Please advise at [facility] [phone]

## 2023-02-07 NOTE — Telephone Encounter (Signed)
Patient called to check on the status of her refill. She has an OV scheduled for 02/13/23, but does not have enough medication to last until then. Best callback is [phone].

## 2023-02-08 NOTE — Telephone Encounter (Signed)
It has been done. Thanks

## 2023-02-13 ENCOUNTER — Ambulatory Visit (INDEPENDENT_AMBULATORY_CARE_PROVIDER_SITE_OTHER): Payer: Medicare Other | Admitting: Internal Medicine

## 2023-02-13 ENCOUNTER — Encounter: Payer: Self-pay | Admitting: Internal Medicine

## 2023-02-13 VITALS — BP 110/70 | HR 68 | Temp 98.6°F | Ht 66.0 in | Wt 151.0 lb

## 2023-02-13 DIAGNOSIS — I25119 Atherosclerotic heart disease of native coronary artery with unspecified angina pectoris: Secondary | ICD-10-CM

## 2023-02-13 DIAGNOSIS — Z79890 Hormone replacement therapy (postmenopausal): Secondary | ICD-10-CM

## 2023-02-13 DIAGNOSIS — N1831 Chronic kidney disease, stage 3a: Secondary | ICD-10-CM | POA: Diagnosis not present

## 2023-02-13 DIAGNOSIS — E034 Atrophy of thyroid (acquired): Secondary | ICD-10-CM

## 2023-02-13 DIAGNOSIS — I10 Essential (primary) hypertension: Secondary | ICD-10-CM | POA: Diagnosis not present

## 2023-02-13 DIAGNOSIS — E785 Hyperlipidemia, unspecified: Secondary | ICD-10-CM

## 2023-02-13 MED ORDER — NIFEDIPINE ER OSMOTIC RELEASE 60 MG PO TB24: 60.0000 mg | ORAL_TABLET | Freq: Every day | ORAL | 3 refills | Status: DC

## 2023-02-13 MED ORDER — POTASSIUM CHLORIDE CRYS ER 20 MEQ PO TBCR: 20.0000 meq | EXTENDED_RELEASE_TABLET | Freq: Every day | ORAL | 3 refills | Status: DC

## 2023-02-13 MED ORDER — METOPROLOL TARTRATE 50 MG PO TABS: 75.0000 mg | ORAL_TABLET | Freq: Three times a day (TID) | ORAL | 3 refills | Status: DC

## 2023-02-13 MED ORDER — ESTRADIOL CYPIONATE 5 MG/ML IM OIL
1.0000 mg | TOPICAL_OIL | Freq: Once | INTRAMUSCULAR | Status: AC
Start: 2023-02-13 — End: 2023-02-13
  Administered 2023-02-13: 1 mg via INTRAMUSCULAR

## 2023-02-13 MED ORDER — FUROSEMIDE 40 MG PO TABS: 40.0000 mg | ORAL_TABLET | Freq: Two times a day (BID) | ORAL | 3 refills | Status: DC

## 2023-02-13 MED ORDER — LEVOTHYROXINE SODIUM 100 MCG PO TABS: ORAL_TABLET | ORAL | 3 refills | Status: DC

## 2023-02-13 MED ORDER — DOXEPIN HCL 50 MG PO CAPS: 150.0000 mg | ORAL_CAPSULE | Freq: Every day | ORAL | 3 refills | Status: DC

## 2023-02-13 NOTE — Assessment & Plan Note (Signed)
Start Levothyroxine 100 mcg every other day and 50 mcg every other day.

## 2023-02-13 NOTE — Assessment & Plan Note (Signed)
Chronic HRT estradiol injections for severe hot flashes Continue w/Depo-Estradiol 0.3 ml IM monthly
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
## 2023-02-13 NOTE — Assessment & Plan Note (Signed)
Chronic. No angina Cont on Toprol, Procardia

## 2023-02-13 NOTE — Assessment & Plan Note (Signed)
Hydrate well.  Continue to monitor GFR

## 2023-02-13 NOTE — Assessment & Plan Note (Signed)
Statin intolerant

## 2023-02-14 ENCOUNTER — Encounter: Payer: Self-pay | Admitting: Internal Medicine

## 2023-02-18 LAB — CBC WITH DIFFERENTIAL/PLATELET
Basophils Absolute: 0 10*3/uL (ref 0.0–0.1)
Basophils Relative: 0.7 % (ref 0.0–3.0)
Eosinophils Absolute: 0.2 10*3/uL (ref 0.0–0.7)
Eosinophils Relative: 2.4 % (ref 0.0–5.0)
HCT: 37.9 % (ref 36.0–46.0)
Hemoglobin: 12.3 g/dL (ref 12.0–15.0)
Lymphocytes Relative: 28.2 % (ref 12.0–46.0)
Lymphs Abs: 1.8 10*3/uL (ref 0.7–4.0)
MCHC: 32.6 g/dL (ref 30.0–36.0)
MCV: 95.3 fL (ref 78.0–100.0)
Monocytes Absolute: 0.4 10*3/uL (ref 0.1–1.0)
Monocytes Relative: 6.4 % (ref 3.0–12.0)
Neutro Abs: 4 10*3/uL (ref 1.4–7.7)
Neutrophils Relative %: 62.3 % (ref 43.0–77.0)
Platelets: 249 10*3/uL (ref 150.0–400.0)
RBC: 3.97 Mil/uL (ref 3.87–5.11)
RDW: 12.8 % (ref 11.5–15.5)
WBC: 6.5 10*3/uL (ref 4.0–10.5)

## 2023-02-18 LAB — LIPID PANEL
Cholesterol: 237 mg/dL — ABNORMAL HIGH (ref 0–200)
HDL: 66 mg/dL (ref >39.00)
LDL Cholesterol: 143 mg/dL — ABNORMAL HIGH (ref 0–99)
NonHDL: 170.79
Total CHOL/HDL Ratio: 4
Triglycerides: 138 mg/dL (ref 0.0–149.0)
VLDL: 27.6 mg/dL (ref 0.0–40.0)

## 2023-02-18 LAB — URINALYSIS, ROUTINE W REFLEX MICROSCOPIC
Bilirubin Urine: NEGATIVE
Ketones, ur: NEGATIVE
Nitrite: NEGATIVE
Specific Gravity, Urine: 1.02 (ref 1.000–1.030)
Total Protein, Urine: 100 — AB
Urine Glucose: NEGATIVE
Urobilinogen, UA: 0.2 (ref 0.0–1.0)
pH: 6 (ref 5.0–8.0)

## 2023-02-18 LAB — COMPREHENSIVE METABOLIC PANEL
ALT: 9 U/L (ref 0–35)
AST: 13 U/L (ref 0–37)
Albumin: 3.8 g/dL (ref 3.5–5.2)
Alkaline Phosphatase: 78 U/L (ref 39–117)
BUN: 15 mg/dL (ref 6–23)
CO2: 29 meq/L (ref 19–32)
Calcium: 8.8 mg/dL (ref 8.4–10.5)
Chloride: 103 meq/L (ref 96–112)
Creatinine, Ser: 1.39 mg/dL — ABNORMAL HIGH (ref 0.40–1.20)
GFR: 36.09 mL/min — ABNORMAL LOW (ref >60.00)
Glucose, Bld: 94 mg/dL (ref 70–99)
Potassium: 4 meq/L (ref 3.5–5.1)
Sodium: 140 meq/L (ref 135–145)
Total Bilirubin: 0.6 mg/dL (ref 0.2–1.2)
Total Protein: 6.6 g/dL (ref 6.0–8.3)

## 2023-02-18 LAB — T4, FREE: Free T4: 0.94 ng/dL (ref 0.60–1.60)

## 2023-02-18 LAB — URIC ACID: Uric Acid, Serum: 8.2 mg/dL — ABNORMAL HIGH (ref 2.4–7.0)

## 2023-02-18 LAB — TSH: TSH: 1.22 u[IU]/mL (ref 0.35–5.50)

## 2023-02-19 ENCOUNTER — Other Ambulatory Visit: Payer: Self-pay | Admitting: Internal Medicine

## 2023-02-19 MED ORDER — NITROFURANTOIN MONOHYD MACRO 100 MG PO CAPS: 100.0000 mg | ORAL_CAPSULE | Freq: Two times a day (BID) | ORAL | 0 refills | Status: DC

## 2023-02-21 ENCOUNTER — Other Ambulatory Visit: Payer: Self-pay | Admitting: Internal Medicine

## 2023-03-14 ENCOUNTER — Encounter: Payer: Self-pay | Admitting: Internal Medicine

## 2023-03-14 ENCOUNTER — Ambulatory Visit: Payer: Medicare Other | Admitting: Internal Medicine

## 2023-03-14 VITALS — BP 130/72 | HR 76 | Temp 98.2°F | Ht 66.0 in | Wt 154.0 lb

## 2023-03-14 DIAGNOSIS — E034 Atrophy of thyroid (acquired): Secondary | ICD-10-CM | POA: Diagnosis not present

## 2023-03-14 DIAGNOSIS — I10 Essential (primary) hypertension: Secondary | ICD-10-CM

## 2023-03-14 DIAGNOSIS — R269 Unspecified abnormalities of gait and mobility: Secondary | ICD-10-CM

## 2023-03-14 DIAGNOSIS — N1831 Chronic kidney disease, stage 3a: Secondary | ICD-10-CM

## 2023-03-14 DIAGNOSIS — Z79890 Hormone replacement therapy (postmenopausal): Secondary | ICD-10-CM | POA: Diagnosis not present

## 2023-03-14 DIAGNOSIS — R499 Unspecified voice and resonance disorder: Secondary | ICD-10-CM

## 2023-03-14 MED ORDER — ESTRADIOL CYPIONATE 5 MG/ML IM OIL
1.0000 mg | TOPICAL_OIL | Freq: Once | INTRAMUSCULAR | Status: AC
Start: 2023-03-14 — End: 2023-03-14
  Administered 2023-03-14: 1 mg via INTRAMUSCULAR

## 2023-03-14 MED ORDER — ASPIRIN 81 MG PO TBEC: 81.0000 mg | DELAYED_RELEASE_TABLET | Freq: Two times a day (BID) | ORAL | Status: DC

## 2023-03-14 NOTE — Assessment & Plan Note (Signed)
Hydrate well.  Continue to monitor GFR 

## 2023-03-14 NOTE — Assessment & Plan Note (Signed)
Start Levothyroxine 100 mcg every other day and 50 mcg every other day. 

## 2023-03-14 NOTE — Assessment & Plan Note (Addendum)
Blood pressure seems to be reasonably well-controlled.  Continue Procardia XL

## 2023-03-14 NOTE — Assessment & Plan Note (Addendum)
  Hoarse high pitch voice - new, ?etiology x10 days Worse. R/o brainstem stroke C/o hoarseness x10 d, "whoozy" feeling and veering to the left - (resolved). Hoarse high pitch voice  remained. Increase 81 mg ASA to bid - can't take more Pt declined brain MRI Head CT Decreased GFR - may need brain CT/CTA Neurology, ENT ref - pt refused Call 911 if relapsed

## 2023-03-14 NOTE — Assessment & Plan Note (Addendum)
Hoarse high pitch voice - new, ?etiology x10 days Worse. R/o brainstem stroke C/o hoarseness x10 d, "whoozy" feeling and veering to the left - (resolved). Hoarse high pitch voice  remained. Increase 81 mg ASA to bid - can't take more Pt declined brain MRI Head CT Decreased GFR - may need brain CT/CTA Neurology, ENT ref - pt refused

## 2023-03-14 NOTE — Progress Notes (Signed)
Subjective:  Patient ID: [name], female    DOB: [birth year]  Age: 77 y.o. MRN: [id number]  CC: Medical Management of Chronic Issues (4 WEEK F/U)   HPI [name] presents for menopausal sx's C/o hoarseness x10 d, "whoozy" feeling - veering to the left - (resolved). No URI sx's.  F/u HTN, gout  Outpatient Medications Prior to Visit  Medication Sig Dispense Refill   Cholecalciferol 1000 UNITS tablet Take 1,000 Units by mouth daily.     clobetasol ointment (TEMOVATE) 0.05 % Apply 1 application. topically 2 (two) times daily as needed. 30 g 3   clorazepate (TRANXENE) 15 MG tablet TAKE 1 TABLET BY MOUTH THREE TIMES DAILY AS NEEDED FOR ANXIETY. DO  NOT  TAKE  WITH  LORAZEPAM. 270 tablet 1   doxepin (SINEQUAN) 50 MG capsule Take 3 capsules (150 mg total) by mouth at bedtime. 90 capsule 3   estradiol (ESTRACE) 0.1 MG/GM vaginal cream Place 1 Applicatorful vaginally as needed (irritation). 42.5 g 3   estradiol cypionate (DEPO-ESTRADIOL) 5 MG/ML injection Use 0.3 ml every 18-25 days IM 5 mL 3   furosemide (LASIX) 40 MG tablet Take 1 tablet (40 mg total) by mouth 2 (two) times daily. 180 tablet 3   levothyroxine (SYNTHROID) 100 MCG tablet Start Levothyroxine 100 mcg every other day and 50 mcg every other day. 90 tablet 3   LORazepam (ATIVAN) 0.5 MG tablet TAKE 1 TABLET BY MOUTH AT BEDTIME 90 tablet 1   methylPREDNISolone (MEDROL DOSEPAK) 4 MG TBPK tablet As directed 21 tablet 0   metoprolol tartrate (LOPRESSOR) 50 MG tablet Take 1.5 tablets (75 mg total) by mouth 3 (three) times daily. 405 tablet 3   NIFEdipine (PROCARDIA XL) 60 MG 24 hr tablet Take 1 tablet (60 mg total) by mouth daily. 90 tablet 3   nitrofurantoin, macrocrystal-monohydrate, (MACROBID) 100 MG capsule Take 1 capsule (100 mg total) by mouth 2 (two) times daily. 10 capsule 0   nitroGLYCERIN (NITROLINGUAL) 0.4 MG/SPRAY spray PLACE 1 SPRAY UNDER THE TONGUE EVERY 5 MINUTES AS NEEDED FOR CHEST PAIN. MAY REPEAT 3 TIMES 4.9 g 6    potassium chloride SA (KLOR-CON M) 20 MEQ tablet Take 1 tablet (20 mEq total) by mouth daily. 90 tablet 3   triamcinolone ointment (KENALOG) 0.1 % Apply 1 Application topically 4 (four) times daily as needed. On hemorrhoids 80 g 2   aspirin 81 MG EC tablet Take 81 mg by mouth daily.     cloNIDine (CATAPRES) 0.1 MG tablet Take 1 tablet (0.1 mg total) by mouth 2 (two) times daily as needed (for blood pressure 160/100 or higher). 60 tablet 11   ampicillin (PRINCIPEN) 500 MG capsule Take 1 capsule (500 mg total) by mouth 4 (four) times daily. (Patient not taking: Reported on 03/14/2023) 28 capsule 1   No facility-administered medications prior to visit.    ROS: Review of Systems  Constitutional:  Positive for diaphoresis. Negative for activity change, appetite change, chills, fatigue and unexpected weight change.  HENT:  Positive for voice change. Negative for congestion, mouth sores, rhinorrhea, sinus pressure, sore throat and trouble swallowing.   Eyes:  Negative for visual disturbance.  Respiratory:  Negative for cough and chest tightness.   Gastrointestinal:  Negative for abdominal pain and nausea.  Genitourinary:  Negative for difficulty urinating, frequency and vaginal pain.  Musculoskeletal:  Positive for arthralgias and back pain. Negative for gait problem.  Skin:  Negative for pallor and rash.  Neurological:  Negative for dizziness,  tremors, weakness, numbness and headaches.  Psychiatric/Behavioral:  Negative for confusion and sleep disturbance.     Objective:  BP 130/72 (BP Location: Right Arm, Patient Position: Sitting, Cuff Size: Normal)   Pulse 76   Temp 98.2 F (36.8 C) (Oral)   Ht 5\' 6"  (1.676 m)   Wt 154 lb (69.9 kg)   SpO2 95%   BMI 24.86 kg/m   BP Readings from Last 3 Encounters:  03/14/23 130/72  02/13/23 110/70  01/17/23 (!) 140/90    Wt Readings from Last 3 Encounters:  03/14/23 154 lb (69.9 kg)  02/13/23 151 lb (68.5 kg)  01/17/23 151 lb (68.5 kg)     Physical Exam Constitutional:      General: She is not in acute distress.    Appearance: Normal appearance. She is well-developed.  HENT:     Head: Normocephalic.     Right Ear: External ear normal.     Left Ear: External ear normal.     Nose: Nose normal.  Eyes:     General:        Right eye: No discharge.        Left eye: No discharge.     Conjunctiva/sclera: Conjunctivae normal.     Pupils: Pupils are equal, round, and reactive to light.  Neck:     Thyroid: No thyromegaly.     Vascular: No JVD.     Trachea: No tracheal deviation.  Cardiovascular:     Rate and Rhythm: Normal rate and regular rhythm.     Heart sounds: Normal heart sounds.  Pulmonary:     Effort: No respiratory distress.     Breath sounds: No stridor. No wheezing.  Abdominal:     General: Bowel sounds are normal. There is no distension.     Palpations: Abdomen is soft. There is no mass.     Tenderness: There is no abdominal tenderness. There is no guarding or rebound.  Musculoskeletal:        General: No tenderness.     Cervical back: Normal range of motion and neck supple. No rigidity.  Lymphadenopathy:     Cervical: No cervical adenopathy.  Skin:    Findings: No erythema or rash.  Neurological:     Cranial Nerves: No cranial nerve deficit.     Motor: No abnormal muscle tone.     Coordination: Coordination normal.     Deep Tendon Reflexes: Reflexes normal.  Psychiatric:        Behavior: Behavior normal.        Thought Content: Thought content normal.        Judgment: Judgment normal.   Hoarse high pitch voice  No other focal deficit Romberg test is negative Slightly ataxic at baseline  Lab Results  Component Value Date   WBC 6.5 02/18/2023   HGB 12.3 02/18/2023   HCT 37.9 02/18/2023   PLT 249.0 02/18/2023   GLUCOSE 94 02/18/2023   CHOL 237 (H) 02/18/2023   TRIG 138.0 02/18/2023   HDL 66.00 02/18/2023   LDLDIRECT 188.2 01/10/2012   LDLCALC 143 (H) 02/18/2023   ALT 9 02/18/2023    AST 13 02/18/2023   NA 140 02/18/2023   K 4.0 02/18/2023   CL 103 02/18/2023   CREATININE 1.39 (H) 02/18/2023   BUN 15 02/18/2023   CO2 29 02/18/2023   TSH 1.22 02/18/2023   INR 1.0 08/01/2018    No results found.  Assessment & Plan:   Problem List Items Addressed This Visit  Hypothyroidism    Start Levothyroxine 100 mcg every other day and 50 mcg every other day.      Essential hypertension    Blood pressure seems to be reasonably well-controlled.  Continue Procardia XL         Relevant Medications   aspirin EC 81 MG tablet   Gait disorder     Hoarse high pitch voice - new, ?etiology x10 days Worse. R/o brainstem stroke C/o hoarseness x10 d, "whoozy" feeling and veering to the left - (resolved). Hoarse high pitch voice  remained. Increase 81 mg ASA to bid - can't take more Pt declined brain MRI Head CT Decreased GFR - may need brain CT/CTA Neurology, ENT ref - pt refused Call 911 if relapsed      Relevant Orders   CT HEAD WO CONTRAST ( )   Hormone replacement therapy (HRT)    Pt is feeling bad off HRT.  Risks/benefits of HRT discussed      Stage 3a chronic kidney disease (HCC)    Hydrate well Continue to monitor GFR      Change in voice - Primary    Hoarse high pitch voice - new, ?etiology x10 days Worse. R/o brainstem stroke C/o hoarseness x10 d, "whoozy" feeling and veering to the left - (resolved). Hoarse high pitch voice  remained. Increase 81 mg ASA to bid - can't take more Pt declined brain MRI Head CT Decreased GFR - may need brain CT/CTA Neurology, ENT ref - pt refused      Relevant Orders   CT HEAD WO CONTRAST ( )      Meds ordered this encounter  Medications   estradiol cypionate (DEPO-ESTRADIOL) 5 MG/ML injection 1 mg   aspirin EC 81 MG tablet    Sig: Take 1 tablet (81 mg total) by mouth 2 (two) times daily.      Follow-up: Return in about 4 weeks (around 04/11/2023) for a follow-up visit.  Sonda Primes, MD

## 2023-03-15 ENCOUNTER — Ambulatory Visit
Admission: RE | Admit: 2023-03-15 | Discharge: 2023-03-15 | Disposition: A | Discharge status: Home / Self Care | Payer: Medicare Other | Source: Ambulatory Visit | Attending: Internal Medicine | Admitting: Internal Medicine

## 2023-03-15 DIAGNOSIS — R499 Unspecified voice and resonance disorder: Secondary | ICD-10-CM

## 2023-03-15 DIAGNOSIS — R269 Unspecified abnormalities of gait and mobility: Secondary | ICD-10-CM

## 2023-03-15 DIAGNOSIS — R2689 Other abnormalities of gait and mobility: Secondary | ICD-10-CM | POA: Diagnosis not present

## 2023-03-15 NOTE — Assessment & Plan Note (Signed)
Pt is feeling bad off HRT.  Risks/benefits of HRT discussed

## 2023-03-18 ENCOUNTER — Encounter: Payer: Self-pay | Admitting: Internal Medicine

## 2023-04-11 ENCOUNTER — Ambulatory Visit: Payer: Medicare Other | Admitting: Internal Medicine

## 2023-04-11 ENCOUNTER — Encounter: Payer: Self-pay | Admitting: Internal Medicine

## 2023-04-11 VITALS — BP 130/78 | HR 63 | Temp 97.7°F | Ht 66.0 in | Wt 153.8 lb

## 2023-04-11 DIAGNOSIS — Z79890 Hormone replacement therapy (postmenopausal): Secondary | ICD-10-CM | POA: Diagnosis not present

## 2023-04-11 DIAGNOSIS — N951 Menopausal and female climacteric states: Secondary | ICD-10-CM | POA: Diagnosis not present

## 2023-04-11 DIAGNOSIS — I10 Essential (primary) hypertension: Secondary | ICD-10-CM

## 2023-04-11 DIAGNOSIS — M10072 Idiopathic gout, left ankle and foot: Secondary | ICD-10-CM

## 2023-04-11 MED ORDER — ESTRADIOL CYPIONATE 5 MG/ML IM OIL
1.0000 mg | TOPICAL_OIL | Freq: Once | INTRAMUSCULAR | Status: AC
Start: 2023-04-11 — End: 2023-04-11
  Administered 2023-04-11: 1 mg via INTRAMUSCULAR

## 2023-04-11 NOTE — Progress Notes (Signed)
Subjective:  Patient ID: [name], female    DOB: 1946/03/16  Age: 77 y.o. MRN: [id number]  CC: Medical Management of Chronic Issues (Hormone replacement therapy. PT notes that doing the half dose of mediation seemed to last the whole 4 week span outside of one or two bouts of hot flashes)   HPI [name] presents for HRT, HTN, gout  Outpatient Medications Prior to Visit  Medication Sig Dispense Refill   aspirin EC 81 MG tablet Take 1 tablet (81 mg total) by mouth 2 (two) times daily.     Cholecalciferol 1000 UNITS tablet Take 1,000 Units by mouth daily.     clobetasol ointment (TEMOVATE) 0.05 % Apply 1 application. topically 2 (two) times daily as needed. 30 g 3   clorazepate (TRANXENE) 15 MG tablet TAKE 1 TABLET BY MOUTH THREE TIMES DAILY AS NEEDED FOR ANXIETY. DO  NOT  TAKE  WITH  LORAZEPAM. 270 tablet 1   doxepin (SINEQUAN) 50 MG capsule Take 3 capsules (150 mg total) by mouth at bedtime. 90 capsule 3   estradiol (ESTRACE) 0.1 MG/GM vaginal cream Place 1 Applicatorful vaginally as needed (irritation). 42.5 g 3   estradiol cypionate (DEPO-ESTRADIOL) 5 MG/ML injection Use 0.3 ml every 18-25 days IM 5 mL 3   furosemide (LASIX) 40 MG tablet Take 1 tablet (40 mg total) by mouth 2 (two) times daily. 180 tablet 3   levothyroxine (SYNTHROID) 100 MCG tablet Start Levothyroxine 100 mcg every other day and 50 mcg every other day. 90 tablet 3   LORazepam (ATIVAN) 0.5 MG tablet TAKE 1 TABLET BY MOUTH AT BEDTIME 90 tablet 1   methylPREDNISolone (MEDROL DOSEPAK) 4 MG TBPK tablet As directed 21 tablet 0   metoprolol tartrate (LOPRESSOR) 50 MG tablet Take 1.5 tablets (75 mg total) by mouth 3 (three) times daily. 405 tablet 3   NIFEdipine (PROCARDIA XL) 60 MG 24 hr tablet Take 1 tablet (60 mg total) by mouth daily. 90 tablet 3   nitrofurantoin, macrocrystal-monohydrate, (MACROBID) 100 MG capsule Take 1 capsule (100 mg total) by mouth 2 (two) times daily. 10 capsule 0   nitroGLYCERIN (NITROLINGUAL)  0.4 MG/SPRAY spray PLACE 1 SPRAY UNDER THE TONGUE EVERY 5 MINUTES AS NEEDED FOR CHEST PAIN. MAY REPEAT 3 TIMES 4.9 g 6   potassium chloride SA (KLOR-CON M) 20 MEQ tablet Take 1 tablet (20 mEq total) by mouth daily. 90 tablet 3   triamcinolone ointment (KENALOG) 0.1 % Apply 1 Application topically 4 (four) times daily as needed. On hemorrhoids 80 g 2   ampicillin (PRINCIPEN) 500 MG capsule Take 1 capsule (500 mg total) by mouth 4 (four) times daily. (Patient not taking: Reported on 03/14/2023) 28 capsule 1   No facility-administered medications prior to visit.    ROS: Review of Systems  Constitutional:  Negative for activity change, appetite change, chills, fatigue and unexpected weight change.  HENT:  Negative for congestion, mouth sores and sinus pressure.   Eyes:  Negative for visual disturbance.  Respiratory:  Negative for cough and chest tightness.   Cardiovascular:  Positive for leg swelling.  Gastrointestinal:  Negative for abdominal pain and nausea.  Genitourinary:  Negative for difficulty urinating, frequency and vaginal pain.  Musculoskeletal:  Positive for arthralgias. Negative for back pain and gait problem.  Skin:  Negative for pallor and rash.  Neurological:  Negative for dizziness, tremors, weakness, numbness and headaches.  Psychiatric/Behavioral:  Negative for confusion, sleep disturbance and suicidal ideas. The patient is nervous/anxious.  Objective:  BP 130/78   Pulse 63   Temp 97.7 F (36.5 C) (Oral)   Ht 5\' 6"  (1.676 m)   Wt 153 lb 12.8 oz (69.8 kg)   SpO2 98%   BMI 24.82 kg/m   BP Readings from Last 3 Encounters:  04/11/23 130/78  03/14/23 130/72  02/13/23 110/70    Wt Readings from Last 3 Encounters:  04/11/23 153 lb 12.8 oz (69.8 kg)  03/14/23 154 lb (69.9 kg)  02/13/23 151 lb (68.5 kg)    Physical Exam Constitutional:      General: She is not in acute distress.    Appearance: Normal appearance. She is well-developed.  HENT:     Head:  Normocephalic.     Right Ear: External ear normal.     Left Ear: External ear normal.     Nose: Nose normal.  Eyes:     General:        Right eye: No discharge.        Left eye: No discharge.     Conjunctiva/sclera: Conjunctivae normal.     Pupils: Pupils are equal, round, and reactive to light.  Neck:     Thyroid: No thyromegaly.     Vascular: No JVD.     Trachea: No tracheal deviation.  Cardiovascular:     Rate and Rhythm: Normal rate and regular rhythm.     Heart sounds: Normal heart sounds.  Pulmonary:     Effort: No respiratory distress.     Breath sounds: No stridor. No wheezing.  Abdominal:     General: Bowel sounds are normal. There is no distension.     Palpations: Abdomen is soft. There is no mass.     Tenderness: There is no abdominal tenderness. There is no guarding or rebound.  Musculoskeletal:        General: No tenderness.     Cervical back: Normal range of motion and neck supple. No rigidity.  Lymphadenopathy:     Cervical: No cervical adenopathy.  Skin:    Findings: No erythema or rash.  Neurological:     Cranial Nerves: No cranial nerve deficit.     Motor: No abnormal muscle tone.     Coordination: Coordination normal.     Deep Tendon Reflexes: Reflexes normal.  Psychiatric:        Behavior: Behavior normal.        Thought Content: Thought content normal.        Judgment: Judgment normal.   Feet w/trace edema  Lab Results  Component Value Date   WBC 6.5 02/18/2023   HGB 12.3 02/18/2023   HCT 37.9 02/18/2023   PLT 249.0 02/18/2023   GLUCOSE 94 02/18/2023   CHOL 237 (H) 02/18/2023   TRIG 138.0 02/18/2023   HDL 66.00 02/18/2023   LDLDIRECT 188.2 01/10/2012   LDLCALC 143 (H) 02/18/2023   ALT 9 02/18/2023   AST 13 02/18/2023   NA 140 02/18/2023   K 4.0 02/18/2023   CL 103 02/18/2023   CREATININE 1.39 (H) 02/18/2023   BUN 15 02/18/2023   CO2 29 02/18/2023   TSH 1.22 02/18/2023   INR 1.0 08/01/2018    CT HEAD WO CONTRAST ( )  Result  Date: 03/15/2023 CLINICAL DATA:  Headache, hoarseness, "whoozy" feeling and veering to the left EXAM: CT HEAD WITHOUT CONTRAST TECHNIQUE: Contiguous axial images were obtained from the base of the skull through the vertex without intravenous contrast. RADIATION DOSE REDUCTION: This exam was performed according to the departmental dose-optimization  program which includes automated exposure control, adjustment of the mA and/or kV according to patient size and/or use of iterative reconstruction technique. COMPARISON:  11/24/2009 FINDINGS: Brain: No evidence of acute infarction, hemorrhage, mass, mass effect, or midline shift. No hydrocephalus or extra-axial fluid collection. Age related cerebral atrophy. Periventricular white matter changes, likely the sequela of chronic small vessel ischemic disease. Vascular: No hyperdense vessel. Skull: Negative for fracture or focal lesion. Sinuses/Orbits: Near complete opacification of the left sphenoid sinus, with osseous thickening. Milder mucosal thickening in the ethmoid air cells. Status post bilateral lens replacements. Other: The mastoid air cells are well aerated. IMPRESSION: 1. No acute intracranial process. 2. Chronic left sphenoid sinusitis. Electronically Signed   By: Wiliam Ke M.D.   On: 03/15/2023 13:39    Assessment & Plan:   Problem List Items Addressed This Visit     Essential hypertension    Blood pressure seems to be reasonably well-controlled.  Continue Procardia XL         Hot flash, menopausal    Cont w/HRT      Gout    No relapse Off Allopurinol       Hormone replacement therapy (HRT) - Primary    See injection adm.  Risks/benefits of HRT discussed         Meds ordered this encounter  Medications   estradiol cypionate (DEPO-ESTRADIOL) 5 MG/ML injection 1 mg      Follow-up: Return in about 4 weeks (around 05/09/2023) for a follow-up visit.  Sonda Primes, MD

## 2023-04-11 NOTE — Assessment & Plan Note (Signed)
Cont w/HRT

## 2023-04-11 NOTE — Assessment & Plan Note (Signed)
No relapse  Off Allopurinol

## 2023-04-11 NOTE — Assessment & Plan Note (Addendum)
See injection adm. Risks/benefits of HRT discussed

## 2023-04-11 NOTE — Assessment & Plan Note (Signed)
Blood pressure seems to be reasonably well-controlled.  Continue Procardia XL

## 2023-04-30 DIAGNOSIS — H3562 Retinal hemorrhage, left eye: Secondary | ICD-10-CM | POA: Diagnosis not present

## 2023-04-30 DIAGNOSIS — H353121 Nonexudative age-related macular degeneration, left eye, early dry stage: Secondary | ICD-10-CM | POA: Diagnosis not present

## 2023-04-30 DIAGNOSIS — H43393 Other vitreous opacities, bilateral: Secondary | ICD-10-CM | POA: Diagnosis not present

## 2023-05-02 DIAGNOSIS — H16141 Punctate keratitis, right eye: Secondary | ICD-10-CM | POA: Diagnosis not present

## 2023-05-09 ENCOUNTER — Encounter: Payer: Self-pay | Admitting: Internal Medicine

## 2023-05-09 ENCOUNTER — Ambulatory Visit: Payer: Medicare Other | Admitting: Internal Medicine

## 2023-05-09 VITALS — BP 130/70 | HR 68 | Temp 98.3°F | Ht 66.0 in | Wt 154.0 lb

## 2023-05-09 DIAGNOSIS — N1831 Chronic kidney disease, stage 3a: Secondary | ICD-10-CM

## 2023-05-09 DIAGNOSIS — Z79890 Hormone replacement therapy (postmenopausal): Secondary | ICD-10-CM

## 2023-05-09 DIAGNOSIS — I25119 Atherosclerotic heart disease of native coronary artery with unspecified angina pectoris: Secondary | ICD-10-CM

## 2023-05-09 DIAGNOSIS — E034 Atrophy of thyroid (acquired): Secondary | ICD-10-CM

## 2023-05-09 DIAGNOSIS — I10 Essential (primary) hypertension: Secondary | ICD-10-CM

## 2023-05-09 DIAGNOSIS — F419 Anxiety disorder, unspecified: Secondary | ICD-10-CM

## 2023-05-09 MED ORDER — ESTRADIOL CYPIONATE 5 MG/ML IM OIL
1.0000 mg | TOPICAL_OIL | Freq: Once | INTRAMUSCULAR | Status: AC
Start: 2023-05-09 — End: 2023-05-09
  Administered 2023-05-09: 1 mg via INTRAMUSCULAR

## 2023-05-09 NOTE — Assessment & Plan Note (Signed)
 See injection adm. Risks/benefits of HRT discussed

## 2023-05-09 NOTE — Progress Notes (Signed)
Subjective:  Patient ID: [name], female    DOB: Dec 02, 1945  Age: 77 y.o. MRN: [id number]  CC: Medical Management of Chronic Issues (4 week f/u)   HPI [name] presents for menopause, gout,   Outpatient Medications Prior to Visit  Medication Sig Dispense Refill   aspirin EC 81 MG tablet Take 1 tablet (81 mg total) by mouth 2 (two) times daily.     Cholecalciferol 1000 UNITS tablet Take 1,000 Units by mouth daily.     clobetasol ointment (TEMOVATE) 0.05 % Apply 1 application. topically 2 (two) times daily as needed. 30 g 3   clorazepate (TRANXENE) 15 MG tablet TAKE 1 TABLET BY MOUTH THREE TIMES DAILY AS NEEDED FOR ANXIETY. DO  NOT  TAKE  WITH  LORAZEPAM. 270 tablet 1   doxepin (SINEQUAN) 50 MG capsule Take 3 capsules (150 mg total) by mouth at bedtime. 90 capsule 3   estradiol (ESTRACE) 0.1 MG/GM vaginal cream Place 1 Applicatorful vaginally as needed (irritation). 42.5 g 3   estradiol cypionate (DEPO-ESTRADIOL) 5 MG/ML injection Use 0.3 ml every 18-25 days IM 5 mL 3   furosemide (LASIX) 40 MG tablet Take 1 tablet (40 mg total) by mouth 2 (two) times daily. 180 tablet 3   levothyroxine (SYNTHROID) 100 MCG tablet Start Levothyroxine 100 mcg every other day and 50 mcg every other day. 90 tablet 3   LORazepam (ATIVAN) 0.5 MG tablet TAKE 1 TABLET BY MOUTH AT BEDTIME 90 tablet 1   metoprolol tartrate (LOPRESSOR) 50 MG tablet Take 1.5 tablets (75 mg total) by mouth 3 (three) times daily. 405 tablet 3   NIFEdipine (PROCARDIA XL) 60 MG 24 hr tablet Take 1 tablet (60 mg total) by mouth daily. 90 tablet 3   nitrofurantoin, macrocrystal-monohydrate, (MACROBID) 100 MG capsule Take 1 capsule (100 mg total) by mouth 2 (two) times daily. 10 capsule 0   nitroGLYCERIN (NITROLINGUAL) 0.4 MG/SPRAY spray PLACE 1 SPRAY UNDER THE TONGUE EVERY 5 MINUTES AS NEEDED FOR CHEST PAIN. MAY REPEAT 3 TIMES 4.9 g 6   potassium chloride SA (KLOR-CON M) 20 MEQ tablet Take 1 tablet (20 mEq total) by mouth daily. 90  tablet 3   triamcinolone ointment (KENALOG) 0.1 % Apply 1 Application topically 4 (four) times daily as needed. On hemorrhoids 80 g 2   ampicillin (PRINCIPEN) 500 MG capsule Take 1 capsule (500 mg total) by mouth 4 (four) times daily. (Patient not taking: Reported on 05/09/2023) 28 capsule 1   methylPREDNISolone (MEDROL DOSEPAK) 4 MG TBPK tablet As directed 21 tablet 0   No facility-administered medications prior to visit.    ROS: Review of Systems  Constitutional:  Positive for fatigue. Negative for activity change, appetite change, chills and unexpected weight change.  HENT:  Negative for congestion, mouth sores and sinus pressure.   Eyes:  Negative for visual disturbance.  Respiratory:  Negative for cough and chest tightness.   Gastrointestinal:  Negative for abdominal pain and nausea.  Genitourinary:  Negative for difficulty urinating, frequency and vaginal pain.  Musculoskeletal:  Positive for arthralgias and gait problem. Negative for back pain.  Skin:  Negative for pallor and rash.  Neurological:  Negative for dizziness, tremors, weakness, numbness and headaches.  Hematological:  Does not bruise/bleed easily.  Psychiatric/Behavioral:  Positive for dysphoric mood. Negative for confusion and sleep disturbance. The patient is nervous/anxious.     Objective:  BP 130/70 (BP Location: Left Arm, Patient Position: Sitting, Cuff Size: Normal)   Pulse 68  Temp 98.3 F (36.8 C) (Oral)   Ht 5\' 6"  (1.676 m)   Wt 154 lb (69.9 kg)   SpO2 98%   BMI 24.86 kg/m   BP Readings from Last 3 Encounters:  05/09/23 130/70  04/11/23 130/78  03/14/23 130/72    Wt Readings from Last 3 Encounters:  05/09/23 154 lb (69.9 kg)  04/11/23 153 lb 12.8 oz (69.8 kg)  03/14/23 154 lb (69.9 kg)    Physical Exam Constitutional:      General: She is not in acute distress.    Appearance: Normal appearance. She is well-developed.  HENT:     Head: Normocephalic.     Right Ear: External ear normal.      Left Ear: External ear normal.     Nose: Nose normal.  Eyes:     General:        Right eye: No discharge.        Left eye: No discharge.     Conjunctiva/sclera: Conjunctivae normal.     Pupils: Pupils are equal, round, and reactive to light.  Neck:     Thyroid: No thyromegaly.     Vascular: No JVD.     Trachea: No tracheal deviation.  Cardiovascular:     Rate and Rhythm: Normal rate and regular rhythm.     Heart sounds: Normal heart sounds.  Pulmonary:     Effort: No respiratory distress.     Breath sounds: No stridor. No wheezing.  Abdominal:     General: Bowel sounds are normal. There is no distension.     Palpations: Abdomen is soft. There is no mass.     Tenderness: There is no abdominal tenderness. There is no guarding or rebound.  Musculoskeletal:        General: No tenderness.     Cervical back: Normal range of motion and neck supple. No rigidity.  Lymphadenopathy:     Cervical: No cervical adenopathy.  Skin:    Findings: No erythema or rash.  Neurological:     Cranial Nerves: No cranial nerve deficit.     Motor: No abnormal muscle tone.     Coordination: Coordination normal.     Deep Tendon Reflexes: Reflexes normal.  Psychiatric:        Behavior: Behavior normal.        Thought Content: Thought content normal.        Judgment: Judgment normal.     Lab Results  Component Value Date   WBC 6.5 02/18/2023   HGB 12.3 02/18/2023   HCT 37.9 02/18/2023   PLT 249.0 02/18/2023   GLUCOSE 94 02/18/2023   CHOL 237 (H) 02/18/2023   TRIG 138.0 02/18/2023   HDL 66.00 02/18/2023   LDLDIRECT 188.2 01/10/2012   LDLCALC 143 (H) 02/18/2023   ALT 9 02/18/2023   AST 13 02/18/2023   NA 140 02/18/2023   K 4.0 02/18/2023   CL 103 02/18/2023   CREATININE 1.39 (H) 02/18/2023   BUN 15 02/18/2023   CO2 29 02/18/2023   TSH 1.22 02/18/2023   INR 1.0 08/01/2018    CT HEAD WO CONTRAST ( ) Result Date: 03/15/2023 CLINICAL DATA:  Headache, hoarseness, "whoozy" feeling and  veering to the left EXAM: CT HEAD WITHOUT CONTRAST TECHNIQUE: Contiguous axial images were obtained from the base of the skull through the vertex without intravenous contrast. RADIATION DOSE REDUCTION: This exam was performed according to the departmental dose-optimization program which includes automated exposure control, adjustment of the mA and/or kV according to patient  size and/or use of iterative reconstruction technique. COMPARISON:  11/24/2009 FINDINGS: Brain: No evidence of acute infarction, hemorrhage, mass, mass effect, or midline shift. No hydrocephalus or extra-axial fluid collection. Age related cerebral atrophy. Periventricular white matter changes, likely the sequela of chronic small vessel ischemic disease. Vascular: No hyperdense vessel. Skull: Negative for fracture or focal lesion. Sinuses/Orbits: Near complete opacification of the left sphenoid sinus, with osseous thickening. Milder mucosal thickening in the ethmoid air cells. Status post bilateral lens replacements. Other: The mastoid air cells are well aerated. IMPRESSION: 1. No acute intracranial process. 2. Chronic left sphenoid sinusitis. Electronically Signed   By: Wiliam Ke M.D.   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 No angina Cont on Toprol, Procardia      Hypothyroidism   Start Levothyroxine 100 mcg every other day and 50 mcg every other day.      Anxiety disorder   Tranzene - rare use  Potential benefits of a long term benzodiazepines  use as well as potential risks  and complications were explained to the patient and were aknowledged.      Essential hypertension   Blood pressure seems to be reasonably well-controlled.  Continue Procardia XL      Hormone replacement therapy (HRT) - Primary   See injection adm. Risks/benefits of HRT discussed       Stage 3a chronic kidney disease (HCC)   Hydrate well Continue to monitor GFR         Meds ordered this encounter  Medications   estradiol cypionate (DEPO-ESTRADIOL) 5 MG/ML injection 1 mg      Follow-up: No follow-ups on file.  Sonda Primes, MD

## 2023-05-09 NOTE — Assessment & Plan Note (Signed)
Chronic. No angina Cont on Toprol, Procardia

## 2023-05-09 NOTE — Assessment & Plan Note (Signed)
Hydrate well.  Continue to monitor GFR 

## 2023-05-09 NOTE — Assessment & Plan Note (Signed)
Blood pressure seems to be reasonably well-controlled.  Continue Procardia XL

## 2023-05-09 NOTE — Assessment & Plan Note (Signed)
Tranzene - rare use  Potential benefits of a long term benzodiazepines  use as well as potential risks  and complications were explained to the patient and were aknowledged.

## 2023-05-09 NOTE — Assessment & Plan Note (Signed)
Start Levothyroxine 100 mcg every other day and 50 mcg every other day. 

## 2023-06-12 ENCOUNTER — Ambulatory Visit: Payer: Medicare Other | Admitting: Internal Medicine

## 2023-06-27 ENCOUNTER — Ambulatory Visit: Payer: Medicare Other | Admitting: Internal Medicine

## 2023-06-27 ENCOUNTER — Encounter: Payer: Self-pay | Admitting: Internal Medicine

## 2023-06-27 VITALS — BP 130/82 | HR 76 | Temp 98.6°F | Wt 154.0 lb

## 2023-06-27 DIAGNOSIS — F329 Major depressive disorder, single episode, unspecified: Secondary | ICD-10-CM | POA: Diagnosis not present

## 2023-06-27 DIAGNOSIS — Z79890 Hormone replacement therapy (postmenopausal): Secondary | ICD-10-CM

## 2023-06-27 DIAGNOSIS — M10072 Idiopathic gout, left ankle and foot: Secondary | ICD-10-CM | POA: Diagnosis not present

## 2023-06-27 DIAGNOSIS — N951 Menopausal and female climacteric states: Secondary | ICD-10-CM | POA: Diagnosis not present

## 2023-06-27 MED ORDER — LORAZEPAM 0.5 MG PO TABS: 0.5000 mg | ORAL_TABLET | Freq: Every day | ORAL | 1 refills | Status: DC

## 2023-06-27 MED ORDER — DIPHENOXYLATE-ATROPINE 2.5-0.025 MG PO TABS: 1.0000 | ORAL_TABLET | Freq: Four times a day (QID) | ORAL | 1 refills | Status: DC | PRN

## 2023-06-27 MED ORDER — FUROSEMIDE 40 MG PO TABS: 40.0000 mg | ORAL_TABLET | Freq: Two times a day (BID) | ORAL | 3 refills | Status: DC

## 2023-06-27 MED ORDER — POTASSIUM CHLORIDE CRYS ER 20 MEQ PO TBCR: 20.0000 meq | EXTENDED_RELEASE_TABLET | Freq: Every day | ORAL | 3 refills | Status: DC

## 2023-06-27 MED ORDER — DOXEPIN HCL 50 MG PO CAPS: 150.0000 mg | ORAL_CAPSULE | Freq: Every day | ORAL | 3 refills | Status: DC

## 2023-06-27 MED ORDER — NIFEDIPINE ER OSMOTIC RELEASE 60 MG PO TB24: 60.0000 mg | ORAL_TABLET | Freq: Every day | ORAL | 3 refills | Status: AC

## 2023-06-27 MED ORDER — ESTRADIOL CYPIONATE 5 MG/ML IM OIL
1.0000 mg | TOPICAL_OIL | Freq: Once | INTRAMUSCULAR | Status: AC
Start: 2023-06-27 — End: 2023-06-27
  Administered 2023-06-27: 1 mg via INTRAMUSCULAR

## 2023-06-27 NOTE — Progress Notes (Signed)
 Subjective:  Patient ID: [name], female    DOB: 1946-03-18  Age: 78 y.o. MRN: [id number]  CC: Medical Management of Chronic Issues (4 MNTH F/U, Pt had a fall x3 weeks ago and states she is still having soreness in her rt inner thigh, left shoulder across her clavicle into the rt shoulder.)   HPI [name] presents for a 4 wks f/u.  Pt had a fall x3 weeks ago and states she is still having soreness in her rt inner thigh, left shoulder across her clavicle into the rt shoulder. Pt slid on ice while getting out of her car on 06/07/2023. C/o pain in B legs, shoulders. No LOC.  Outpatient Medications Prior to Visit  Medication Sig Dispense Refill  . aspirin  EC 81 MG tablet Take 1 tablet (81 mg total) by mouth 2 (two) times daily.    . Cholecalciferol 1000 UNITS tablet Take 1,000 Units by mouth daily.    . clobetasol  ointment (TEMOVATE ) 0.05 % Apply 1 application. topically 2 (two) times daily as needed. 30 g 3  . clorazepate  (TRANXENE ) 15 MG tablet TAKE 1 TABLET BY MOUTH THREE TIMES DAILY AS NEEDED FOR ANXIETY. DO  NOT  TAKE  WITH  LORAZEPAM . 270 tablet 1  . estradiol  (ESTRACE ) 0.1 MG/GM vaginal cream Place 1 Applicatorful vaginally as needed (irritation). 42.5 g 3  . estradiol  cypionate (DEPO-ESTRADIOL ) 5 MG/ML injection Use 0.3 ml every 18-25 days IM 5 mL 3  . levothyroxine  (SYNTHROID ) 100 MCG tablet Start Levothyroxine  100 mcg every other day and 50 mcg every other day. 90 tablet 3  . metoprolol  tartrate (LOPRESSOR ) 50 MG tablet Take 1.5 tablets (75 mg total) by mouth 3 (three) times daily. 405 tablet 3  . nitrofurantoin , macrocrystal-monohydrate, (MACROBID ) 100 MG capsule Take 1 capsule (100 mg total) by mouth 2 (two) times daily. 10 capsule 0  . nitroGLYCERIN  (NITROLINGUAL ) 0.4 MG/SPRAY spray PLACE 1 SPRAY UNDER THE TONGUE EVERY 5 MINUTES AS NEEDED FOR CHEST PAIN. MAY REPEAT 3 TIMES 4.9 g 6  . triamcinolone  ointment (KENALOG ) 0.1 % Apply 1 Application topically 4 (four) times daily as  needed. On hemorrhoids 80 g 2  . doxepin  (SINEQUAN ) 50 MG capsule Take 3 capsules (150 mg total) by mouth at bedtime. 90 capsule 3  . furosemide  (LASIX ) 40 MG tablet Take 1 tablet (40 mg total) by mouth 2 (two) times daily. 180 tablet 3  . LORazepam  (ATIVAN ) 0.5 MG tablet TAKE 1 TABLET BY MOUTH AT BEDTIME 90 tablet 1  . NIFEdipine  (PROCARDIA  XL) 60 MG 24 hr tablet Take 1 tablet (60 mg total) by mouth daily. 90 tablet 3  . potassium chloride  SA (KLOR-CON  M) 20 MEQ tablet Take 1 tablet (20 mEq total) by mouth daily. 90 tablet 3  . ampicillin  (PRINCIPEN) 500 MG capsule Take 1 capsule (500 mg total) by mouth 4 (four) times daily. (Patient not taking: Reported on 03/14/2023) 28 capsule 1   No facility-administered medications prior to visit.    ROS: Review of Systems  Constitutional:  Negative for activity change, appetite change, chills, fatigue and unexpected weight change.  HENT:  Negative for congestion, mouth sores and sinus pressure.   Eyes:  Negative for visual disturbance.  Respiratory:  Negative for cough and chest tightness.   Gastrointestinal:  Negative for abdominal pain and nausea.  Genitourinary:  Negative for difficulty urinating, frequency and vaginal pain.  Musculoskeletal:  Positive for arthralgias, back pain and gait problem.  Skin:  Negative for pallor and  rash.  Neurological:  Negative for dizziness, tremors, weakness, numbness and headaches.  Psychiatric/Behavioral:  Negative for confusion and sleep disturbance.     Objective:  BP 130/82 (BP Location: Right Arm, Patient Position: Sitting, Cuff Size: Normal)   Pulse 76   Temp 98.6 F (37 C) (Oral)   Wt 154 lb (69.9 kg)   SpO2 97%   BMI 24.86 kg/m   BP Readings from Last 3 Encounters:  06/27/23 130/82  05/09/23 130/70  04/11/23 130/78    Wt Readings from Last 3 Encounters:  06/27/23 154 lb (69.9 kg)  05/09/23 154 lb (69.9 kg)  04/11/23 153 lb 12.8 oz (69.8 kg)    Physical Exam Constitutional:       General: She is not in acute distress.    Appearance: She is well-developed. She is obese.  HENT:     Head: Normocephalic.     Right Ear: External ear normal.     Left Ear: External ear normal.     Nose: Nose normal.  Eyes:     General:        Right eye: No discharge.        Left eye: No discharge.     Conjunctiva/sclera: Conjunctivae normal.     Pupils: Pupils are equal, round, and reactive to light.  Neck:     Thyroid : No thyromegaly.     Vascular: No JVD.     Trachea: No tracheal deviation.  Cardiovascular:     Rate and Rhythm: Normal rate and regular rhythm.     Heart sounds: Normal heart sounds.  Pulmonary:     Effort: No respiratory distress.     Breath sounds: No stridor. No wheezing.  Abdominal:     General: Bowel sounds are normal. There is no distension.     Palpations: Abdomen is soft. There is no mass.     Tenderness: There is no abdominal tenderness. There is no guarding or rebound.  Musculoskeletal:        General: No tenderness.     Cervical back: Normal range of motion and neck supple. No rigidity.  Lymphadenopathy:     Cervical: No cervical adenopathy.  Skin:    Findings: No erythema or rash.  Neurological:     Cranial Nerves: No cranial nerve deficit.     Motor: No abnormal muscle tone.     Coordination: Coordination normal.     Deep Tendon Reflexes: Reflexes normal.  Psychiatric:        Behavior: Behavior normal.        Thought Content: Thought content normal.        Judgment: Judgment normal.    Lab Results  Component Value Date   WBC 6.5 02/18/2023   HGB 12.3 02/18/2023   HCT 37.9 02/18/2023   PLT 249.0 02/18/2023   GLUCOSE 94 02/18/2023   CHOL 237 (H) 02/18/2023   TRIG 138.0 02/18/2023   HDL 66.00 02/18/2023   LDLDIRECT 188.2 01/10/2012   LDLCALC 143 (H) 02/18/2023   ALT 9 02/18/2023   AST 13 02/18/2023   NA 140 02/18/2023   K 4.0 02/18/2023   CL 103 02/18/2023   CREATININE 1.39 (H) 02/18/2023   BUN 15 02/18/2023   CO2 29  02/18/2023   TSH 1.22 02/18/2023   INR 1.0 08/01/2018    CT HEAD WO CONTRAST ( ) Result Date: 03/15/2023 CLINICAL DATA:  Headache, hoarseness, whoozy feeling and veering to the left EXAM: CT HEAD WITHOUT CONTRAST TECHNIQUE: Contiguous axial images were obtained from  the base of the skull through the vertex without intravenous contrast. RADIATION DOSE REDUCTION: This exam was performed according to the departmental dose-optimization program which includes automated exposure control, adjustment of the mA and/or kV according to patient size and/or use of iterative reconstruction technique. COMPARISON:  11/24/2009 FINDINGS: Brain: No evidence of acute infarction, hemorrhage, mass, mass effect, or midline shift. No hydrocephalus or extra-axial fluid collection. Age related cerebral atrophy. Periventricular white matter changes, likely the sequela of chronic small vessel ischemic disease. Vascular: No hyperdense vessel. Skull: Negative for fracture or focal lesion. Sinuses/Orbits: Near complete opacification of the left sphenoid sinus, with osseous thickening. Milder mucosal thickening in the ethmoid air cells. Status post bilateral lens replacements. Other: The mastoid air cells are well aerated. IMPRESSION: 1. No acute intracranial process. 2. Chronic left sphenoid sinusitis. Electronically Signed   By: Donald Campion M.D.   On: 03/15/2023 13:39    Assessment & Plan:   Problem List Items Addressed This Visit     Depression   Cont w/Doxepin  at hs      Relevant Medications   doxepin  (SINEQUAN ) 50 MG capsule   LORazepam  (ATIVAN ) 0.5 MG tablet   Hot flash, menopausal   Cont w/HRT      Relevant Medications   furosemide  (LASIX ) 40 MG tablet   NIFEdipine  (PROCARDIA  XL) 60 MG 24 hr tablet   Gout   No relapse Off Allopurinol        Hormone replacement therapy (HRT) - Primary      Meds ordered this encounter  Medications  . estradiol  cypionate (DEPO-ESTRADIOL ) 5 MG/ML injection 1 mg  .  doxepin  (SINEQUAN ) 50 MG capsule    Sig: Take 3 capsules (150 mg total) by mouth at bedtime.    Dispense:  90 capsule    Refill:  3  . furosemide  (LASIX ) 40 MG tablet    Sig: Take 1 tablet (40 mg total) by mouth 2 (two) times daily.    Dispense:  180 tablet    Refill:  3  . LORazepam  (ATIVAN ) 0.5 MG tablet    Sig: Take 1 tablet (0.5 mg total) by mouth at bedtime.    Dispense:  90 tablet    Refill:  1  . potassium chloride  SA (KLOR-CON  M) 20 MEQ tablet    Sig: Take 1 tablet (20 mEq total) by mouth daily.    Dispense:  90 tablet    Refill:  3  . NIFEdipine  (PROCARDIA  XL) 60 MG 24 hr tablet    Sig: Take 1 tablet (60 mg total) by mouth daily.    Dispense:  90 tablet    Refill:  3  . diphenoxylate -atropine  (LOMOTIL ) 2.5-0.025 MG tablet    Sig: Take 1 tablet by mouth 4 (four) times daily as needed for diarrhea or loose stools.    Dispense:  40 tablet    Refill:  1      Follow-up: Return in about 4 weeks (around 07/25/2023) for a follow-up visit.  Marolyn Noel, MD

## 2023-06-27 NOTE — Assessment & Plan Note (Signed)
Cont w/Doxepin at hs ?

## 2023-06-27 NOTE — Assessment & Plan Note (Signed)
No relapse  Off Allopurinol

## 2023-06-27 NOTE — Assessment & Plan Note (Signed)
 Cont w/HRT

## 2023-07-25 ENCOUNTER — Encounter: Payer: Self-pay | Admitting: Internal Medicine

## 2023-07-25 ENCOUNTER — Ambulatory Visit: Payer: Medicare Other | Admitting: Internal Medicine

## 2023-07-25 VITALS — BP 130/68 | HR 96 | Temp 98.0°F | Ht 66.0 in | Wt 156.0 lb

## 2023-07-25 DIAGNOSIS — F419 Anxiety disorder, unspecified: Secondary | ICD-10-CM

## 2023-07-25 DIAGNOSIS — Z79890 Hormone replacement therapy (postmenopausal): Secondary | ICD-10-CM | POA: Diagnosis not present

## 2023-07-25 DIAGNOSIS — M79604 Pain in right leg: Secondary | ICD-10-CM

## 2023-07-25 MED ORDER — ESTRADIOL CYPIONATE 5 MG/ML IM OIL
1.0000 mg | TOPICAL_OIL | Freq: Once | INTRAMUSCULAR | Status: AC
  Administered 2023-07-25: 1 mg via INTRAMUSCULAR

## 2023-07-25 NOTE — Patient Instructions (Signed)
 Nylon Spandex Thigh Support

## 2023-07-25 NOTE — Progress Notes (Signed)
 Subjective:  Patient ID: [name], female    DOB: May 31, 1945  Age: 78 y.o. MRN: [id number]  CC: Medical Management of Chronic Issues (4 WEEK f/u, Pt states she is having pain in her rt thigh and back of thigh area since her fall in January. Pt tried a knee brace and it has not helped.)   HPI [name] presents for pain in her rt thigh and back of thigh area since her fall in January. Pt tried a knee brace and it has not helped. No knee pain. Asper cream is helping. No pain in the R shoulder now F/u on HRT, anxiety  Outpatient Medications Prior to Visit  Medication Sig Dispense Refill   aspirin EC 81 MG tablet Take 1 tablet (81 mg total) by mouth 2 (two) times daily.     Cholecalciferol 1000 UNITS tablet Take 1,000 Units by mouth daily.     clobetasol ointment (TEMOVATE) 0.05 % Apply 1 application. topically 2 (two) times daily as needed. 30 g 3   clorazepate (TRANXENE) 15 MG tablet TAKE 1 TABLET BY MOUTH THREE TIMES DAILY AS NEEDED FOR ANXIETY. DO  NOT  TAKE  WITH  LORAZEPAM. 270 tablet 1   diphenoxylate-atropine (LOMOTIL) 2.5-0.025 MG tablet Take 1 tablet by mouth 4 (four) times daily as needed for diarrhea or loose stools. 40 tablet 1   doxepin (SINEQUAN) 50 MG capsule Take 3 capsules (150 mg total) by mouth at bedtime. 90 capsule 3   estradiol (ESTRACE) 0.1 MG/GM vaginal cream Place 1 Applicatorful vaginally as needed (irritation). 42.5 g 3   estradiol cypionate (DEPO-ESTRADIOL) 5 MG/ML injection Use 0.3 ml every 18-25 days IM 5 mL 3   furosemide (LASIX) 40 MG tablet Take 1 tablet (40 mg total) by mouth 2 (two) times daily. 180 tablet 3   levothyroxine (SYNTHROID) 100 MCG tablet Start Levothyroxine 100 mcg every other day and 50 mcg every other day. 90 tablet 3   LORazepam (ATIVAN) 0.5 MG tablet Take 1 tablet (0.5 mg total) by mouth at bedtime. 90 tablet 1   metoprolol tartrate (LOPRESSOR) 50 MG tablet Take 1.5 tablets (75 mg total) by mouth 3 (three) times daily. 405 tablet 3    NIFEdipine (PROCARDIA XL) 60 MG 24 hr tablet Take 1 tablet (60 mg total) by mouth daily. 90 tablet 3   nitrofurantoin, macrocrystal-monohydrate, (MACROBID) 100 MG capsule Take 1 capsule (100 mg total) by mouth 2 (two) times daily. 10 capsule 0   nitroGLYCERIN (NITROLINGUAL) 0.4 MG/SPRAY spray PLACE 1 SPRAY UNDER THE TONGUE EVERY 5 MINUTES AS NEEDED FOR CHEST PAIN. MAY REPEAT 3 TIMES 4.9 g 6   potassium chloride SA (KLOR-CON M) 20 MEQ tablet Take 1 tablet (20 mEq total) by mouth daily. 90 tablet 3   triamcinolone ointment (KENALOG) 0.1 % Apply 1 Application topically 4 (four) times daily as needed. On hemorrhoids 80 g 2   ampicillin (PRINCIPEN) 500 MG capsule Take 1 capsule (500 mg total) by mouth 4 (four) times daily. (Patient not taking: Reported on 07/25/2023) 28 capsule 1   No facility-administered medications prior to visit.    ROS: Review of Systems  Constitutional:  Negative for activity change, appetite change, chills, fatigue and unexpected weight change.  HENT:  Negative for congestion, mouth sores and sinus pressure.   Eyes:  Negative for visual disturbance.  Respiratory:  Negative for cough and chest tightness.   Cardiovascular:  Negative for leg swelling.  Gastrointestinal:  Negative for abdominal pain and nausea.  Genitourinary:  Negative for difficulty urinating, frequency and vaginal pain.  Musculoskeletal:  Positive for gait problem. Negative for back pain.  Skin:  Negative for color change, pallor, rash and wound.  Neurological:  Negative for dizziness, tremors, weakness, numbness and headaches.  Psychiatric/Behavioral:  Negative for confusion and sleep disturbance.     Objective:  BP 130/68   Pulse 96   Temp 98 F (36.7 C) (Oral)   Ht 5\' 6"  (1.676 m)   Wt 156 lb (70.8 kg)   SpO2 96%   BMI 25.18 kg/m   BP Readings from Last 3 Encounters:  07/25/23 130/68  06/27/23 130/82  05/09/23 130/70    Wt Readings from Last 3 Encounters:  07/25/23 156 lb (70.8 kg)   06/27/23 154 lb (69.9 kg)  05/09/23 154 lb (69.9 kg)    Physical Exam Constitutional:      General: She is not in acute distress.    Appearance: She is well-developed.  HENT:     Head: Normocephalic.     Right Ear: External ear normal.     Left Ear: External ear normal.     Nose: Nose normal.  Eyes:     General:        Right eye: No discharge.        Left eye: No discharge.     Conjunctiva/sclera: Conjunctivae normal.     Pupils: Pupils are equal, round, and reactive to light.  Neck:     Thyroid: No thyromegaly.     Vascular: No JVD.     Trachea: No tracheal deviation.  Cardiovascular:     Rate and Rhythm: Normal rate and regular rhythm.     Heart sounds: Normal heart sounds.  Pulmonary:     Effort: No respiratory distress.     Breath sounds: No stridor. No wheezing.  Abdominal:     General: Bowel sounds are normal. There is no distension.     Palpations: Abdomen is soft. There is no mass.     Tenderness: There is no abdominal tenderness. There is no guarding or rebound.  Musculoskeletal:        General: No tenderness.     Cervical back: Normal range of motion and neck supple. No rigidity.  Lymphadenopathy:     Cervical: No cervical adenopathy.  Skin:    Findings: No erythema or rash.  Neurological:     Cranial Nerves: No cranial nerve deficit.     Motor: No abnormal muscle tone.     Coordination: Coordination normal.     Deep Tendon Reflexes: Reflexes normal.  Psychiatric:        Behavior: Behavior normal.        Thought Content: Thought content normal.        Judgment: Judgment normal.    B knees are ok R inner leg w/pain on palpations in the abductors  No DVT sx's  Full ROM  Lab Results  Component Value Date   WBC 6.5 02/18/2023   HGB 12.3 02/18/2023   HCT 37.9 02/18/2023   PLT 249.0 02/18/2023   GLUCOSE 94 02/18/2023   CHOL 237 (H) 02/18/2023   TRIG 138.0 02/18/2023   HDL 66.00 02/18/2023   LDLDIRECT 188.2 01/10/2012   LDLCALC 143 (H)  02/18/2023   ALT 9 02/18/2023   AST 13 02/18/2023   NA 140 02/18/2023   K 4.0 02/18/2023   CL 103 02/18/2023   CREATININE 1.39 (H) 02/18/2023   BUN 15 02/18/2023   CO2 29 02/18/2023  TSH 1.22 02/18/2023   INR 1.0 08/01/2018    CT HEAD WO CONTRAST ( ) Result Date: 03/15/2023 CLINICAL DATA:  Headache, hoarseness, "whoozy" feeling and veering to the left EXAM: CT HEAD WITHOUT CONTRAST TECHNIQUE: Contiguous axial images were obtained from the base of the skull through the vertex without intravenous contrast. RADIATION DOSE REDUCTION: This exam was performed according to the departmental dose-optimization program which includes automated exposure control, adjustment of the mA and/or kV according to patient size and/or use of iterative reconstruction technique. COMPARISON:  11/24/2009 FINDINGS: Brain: No evidence of acute infarction, hemorrhage, mass, mass effect, or midline shift. No hydrocephalus or extra-axial fluid collection. Age related cerebral atrophy. Periventricular white matter changes, likely the sequela of chronic small vessel ischemic disease. Vascular: No hyperdense vessel. Skull: Negative for fracture or focal lesion. Sinuses/Orbits: Near complete opacification of the left sphenoid sinus, with osseous thickening. Milder mucosal thickening in the ethmoid air cells. Status post bilateral lens replacements. Other: The mastoid air cells are well aerated. IMPRESSION: 1. No acute intracranial process. 2. Chronic left sphenoid sinusitis. Electronically Signed   By: Wiliam Ke M.D.   On: 03/15/2023 13:39    Assessment & Plan:   Problem List Items Addressed This Visit     Anxiety disorder   Tranzene - rare use  Potential benefits of a long term benzodiazepines  use as well as potential risks  and complications were explained to the patient and were aknowledged.      Hormone replacement therapy (HRT) - Primary   See injection adm. Risks/benefits of HRT discussed      Leg pain,  anterior, right   Nylon Spandex Thigh Support Aspercream tid Tylenol prn         Meds ordered this encounter  Medications   estradiol cypionate (DEPO-ESTRADIOL) 5 MG/ML injection 1 mg      Follow-up: Return in about 4 weeks (around 08/22/2023) for a follow-up visit.  Sonda Primes, MD

## 2023-07-25 NOTE — Assessment & Plan Note (Signed)
 Nylon Spandex Thigh Support Aspercream tid Tylenol prn

## 2023-07-26 NOTE — Assessment & Plan Note (Signed)
 See injection adm. Risks/benefits of HRT discussed

## 2023-07-26 NOTE — Assessment & Plan Note (Signed)
Tranzene - rare use  Potential benefits of a long term benzodiazepines  use as well as potential risks  and complications were explained to the patient and were aknowledged.

## 2023-08-08 ENCOUNTER — Ambulatory Visit: Payer: Self-pay

## 2023-08-08 NOTE — Telephone Encounter (Signed)
  Chief Complaint: leg Pain s/p fall in January Symptoms: mild pain and burning sensation where patient feels like leg feels better straight Frequency: pain started after January Pertinent Negatives: Patient denies fever Disposition: [] ED /[] Urgent Care (no appt availability in office) / [] Appointment(In office/virtual)/ []  [facility]/ [] Home Care/ [] Refused Recommended Disposition /[] [facility]/ [x]  Follow-up with PCP Additional Notes: patient calling with concerns for her leg pain that has been going on since January. Patient endorses mild pain but states she is having a burning sensation. Patient states leg feels better straight. Patient is concerned with the burning sensation. Patient states it goes away after awhile. Patient is recommended to be seen in 2 weeks. Patient has an appointment to be PCP on 08/22/2023. Patient is asking if the provider feels like she needs to be seen sooner, please call patient. Patient verbalizes understanding and all questions answered.    Copied from CRM [id number]. Topic: Clinical - Red Word Triage >> Aug 08, 2023  1:48 PM Gurney Maxin H wrote: [facility] that prompted transfer to Nurse Triage: Worse pain in right leg from fall in January Reason for Disposition  [1] MILD pain (e.g., does not interfere with normal activities) AND [2] present > 7 days  Answer Assessment - Initial Assessment Questions 1. ONSET: "When did the pain start?"      Initial pain started after falling in January. Patient states pain is worse over the last couple of days.  2. LOCATION: "Where is the pain located?"      Right leg 3. PAIN: "How bad is the pain?"    (Scale 1-10; or mild, moderate, severe)   -  MILD (1-3): doesn't interfere with normal activities    -  MODERATE (4-7): interferes with normal activities (e.g., work or school) or awakens from sleep, limping    -  SEVERE (8-10): excruciating pain, unable to do any normal activities, unable to walk     Mild 4.  WORK OR EXERCISE: "Has there been any recent work or exercise that involved this part of the body?"      No 5. CAUSE: "What do you think is causing the leg pain?"     Larey Seat while trying to get out of her car in Jan 6. OTHER SYMPTOMS: "Do you have any other symptoms?" (e.g., chest pain, back pain, breathing difficulty, swelling, rash, fever, numbness, weakness)     No  Protocols used: Leg Pain-A-AH

## 2023-08-13 NOTE — Telephone Encounter (Signed)
 Please schedule an office visit with any provider.  Use Tylenol, heat/ice.  Thank you

## 2023-08-16 NOTE — Telephone Encounter (Signed)
 Called and spoke with patient and informed her of Dr.Plotnikov's recommendations. Patient expressed understanding and currently is rotating tylenol and ice (stated the heat didn't work as well for her). Patient notes they would still like to wait until their follow up with Dr.Plotnikov (08/22/23). Notes no new swelling just pain/burning

## 2023-08-20 ENCOUNTER — Telehealth: Payer: Self-pay | Admitting: Internal Medicine

## 2023-08-20 NOTE — Telephone Encounter (Signed)
 E2C2 please reschedule pt due to Dr. Macario Golds will be out of the office on 08/22/23. Thanks

## 2023-08-21 ENCOUNTER — Encounter: Payer: Self-pay | Admitting: Internal Medicine

## 2023-08-21 ENCOUNTER — Ambulatory Visit (INDEPENDENT_AMBULATORY_CARE_PROVIDER_SITE_OTHER): Admitting: Internal Medicine

## 2023-08-21 VITALS — BP 190/82 | HR 56 | Temp 97.6°F | Ht 66.0 in | Wt 156.2 lb

## 2023-08-21 DIAGNOSIS — F439 Reaction to severe stress, unspecified: Secondary | ICD-10-CM | POA: Insufficient documentation

## 2023-08-21 DIAGNOSIS — G8929 Other chronic pain: Secondary | ICD-10-CM | POA: Diagnosis not present

## 2023-08-21 DIAGNOSIS — N951 Menopausal and female climacteric states: Secondary | ICD-10-CM

## 2023-08-21 DIAGNOSIS — N1831 Chronic kidney disease, stage 3a: Secondary | ICD-10-CM | POA: Diagnosis not present

## 2023-08-21 DIAGNOSIS — I10 Essential (primary) hypertension: Secondary | ICD-10-CM

## 2023-08-21 DIAGNOSIS — M25559 Pain in unspecified hip: Secondary | ICD-10-CM

## 2023-08-21 DIAGNOSIS — F419 Anxiety disorder, unspecified: Secondary | ICD-10-CM

## 2023-08-21 DIAGNOSIS — E034 Atrophy of thyroid (acquired): Secondary | ICD-10-CM

## 2023-08-21 NOTE — Assessment & Plan Note (Signed)
Tranzene - rare use  Potential benefits of a long term benzodiazepines  use as well as potential risks  and complications were explained to the patient and were aknowledged.
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
## 2023-08-21 NOTE — Assessment & Plan Note (Signed)
Blood pressure seems to be reasonably well-controlled.  Continue Procardia XL

## 2023-08-21 NOTE — Assessment & Plan Note (Signed)
Hydrate well.  Continue to monitor GFR 

## 2023-08-21 NOTE — Assessment & Plan Note (Signed)
 R hip troch bursitis - discussed treatment options Heat, massage

## 2023-08-21 NOTE — Assessment & Plan Note (Signed)
Start Levothyroxine 100 mcg every other day and 50 mcg every other day. 
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
## 2023-08-21 NOTE — Assessment & Plan Note (Signed)
 Cont w/HRT injections

## 2023-08-22 ENCOUNTER — Ambulatory Visit: Admitting: Internal Medicine

## 2023-08-22 ENCOUNTER — Ambulatory Visit

## 2023-08-29 ENCOUNTER — Ambulatory Visit: Payer: Self-pay

## 2023-08-29 ENCOUNTER — Ambulatory Visit: Admitting: Internal Medicine

## 2023-08-29 ENCOUNTER — Ambulatory Visit (INDEPENDENT_AMBULATORY_CARE_PROVIDER_SITE_OTHER)

## 2023-08-29 ENCOUNTER — Ambulatory Visit: Admitting: Family Medicine

## 2023-08-29 ENCOUNTER — Other Ambulatory Visit: Payer: Self-pay

## 2023-08-29 ENCOUNTER — Encounter: Payer: Self-pay | Admitting: Internal Medicine

## 2023-08-29 ENCOUNTER — Encounter: Payer: Self-pay | Admitting: Family Medicine

## 2023-08-29 VITALS — BP 170/90 | HR 67 | Ht 66.0 in | Wt 156.0 lb

## 2023-08-29 VITALS — BP 170/90 | HR 67 | Temp 98.0°F | Ht 66.0 in | Wt 156.0 lb

## 2023-08-29 DIAGNOSIS — M25551 Pain in right hip: Secondary | ICD-10-CM | POA: Diagnosis not present

## 2023-08-29 DIAGNOSIS — M4807 Spinal stenosis, lumbosacral region: Secondary | ICD-10-CM | POA: Diagnosis not present

## 2023-08-29 DIAGNOSIS — M7989 Other specified soft tissue disorders: Secondary | ICD-10-CM | POA: Diagnosis not present

## 2023-08-29 DIAGNOSIS — M79661 Pain in right lower leg: Secondary | ICD-10-CM

## 2023-08-29 DIAGNOSIS — R600 Localized edema: Secondary | ICD-10-CM

## 2023-08-29 DIAGNOSIS — G8929 Other chronic pain: Secondary | ICD-10-CM

## 2023-08-29 DIAGNOSIS — M5117 Intervertebral disc disorders with radiculopathy, lumbosacral region: Secondary | ICD-10-CM | POA: Diagnosis not present

## 2023-08-29 DIAGNOSIS — M5416 Radiculopathy, lumbar region: Secondary | ICD-10-CM | POA: Diagnosis not present

## 2023-08-29 DIAGNOSIS — M79604 Pain in right leg: Secondary | ICD-10-CM

## 2023-08-29 DIAGNOSIS — M25561 Pain in right knee: Secondary | ICD-10-CM

## 2023-08-29 DIAGNOSIS — M1711 Unilateral primary osteoarthritis, right knee: Secondary | ICD-10-CM | POA: Diagnosis not present

## 2023-08-29 DIAGNOSIS — M7071 Other bursitis of hip, right hip: Secondary | ICD-10-CM | POA: Insufficient documentation

## 2023-08-29 DIAGNOSIS — M25461 Effusion, right knee: Secondary | ICD-10-CM | POA: Diagnosis not present

## 2023-08-29 DIAGNOSIS — M4727 Other spondylosis with radiculopathy, lumbosacral region: Secondary | ICD-10-CM | POA: Diagnosis not present

## 2023-08-29 DIAGNOSIS — M4726 Other spondylosis with radiculopathy, lumbar region: Secondary | ICD-10-CM | POA: Diagnosis not present

## 2023-08-29 DIAGNOSIS — I10 Essential (primary) hypertension: Secondary | ICD-10-CM | POA: Diagnosis not present

## 2023-08-29 NOTE — Patient Instructions (Signed)
 Please continue all other medications as before, and refills have been done if requested.  Please have the pharmacy call with any other refills you may need.  Please keep your appointments with your specialists as you may have planned  You will be contacted regarding the referral for: stat Right Leg Venous Doppler to make sure no blood clot  You will be contacted regarding the referral for: Sports Medicine for right upper leg pain and right hip bursitis

## 2023-08-29 NOTE — Assessment & Plan Note (Addendum)
 BP Readings from Last 3 Encounters:  08/29/23 (!) 170/90  08/29/23 (!) 170/90  08/21/23 (!) 190/82   uncontrolled, pt to continue medical treatment lopressor 75 tid, procardia xl 60 every day as declines change today

## 2023-08-29 NOTE — Progress Notes (Signed)
 Patient ID: [name], female   DOB: 1946-05-03, 78 y.o.   MRN: [id number]        Chief Complaint: follow up right leg pain and swelling, right hip bursitis, htn        HPI:  [name] is a 78 y.o. female here with c/o 3 months onset persistent mod to occasionally severe sharp and burning pain worse to any lateral leg movement (not flexion extension) to the medial right knee and adjacent gracilis area of the right upper leg.  Started at one point where she slipped on ice with the left foot getting out of the car, lost balance and went body next out the door but unfortunately the right leg did not come with her resulting in what sounds like a torque injury to the right medial knee and upper medial leg most adjacent to the medial knee.  Has had persistent pain and swelling maybe worse in the past week.Limps to walk but no falls or other injury.   Has seen PCP but conservative measures not helping.  No imaging to date.  Very nervous about her predicament.    Also incidentally has 1 wk onset right lateral hip sore tenderness worse to lie on at night       Wt Readings from Last 3 Encounters:  08/29/23 156 lb (70.8 kg)  08/29/23 156 lb (70.8 kg)  08/21/23 156 lb 3.2 oz (70.9 kg)   BP Readings from Last 3 Encounters:  08/29/23 (!) 170/90  08/29/23 (!) 170/90  08/21/23 (!) 190/82         Past Medical History:  Diagnosis Date   Anxiety state, unspecified    Bronchitis, acute    Carotid bruit    Coronary atherosclerosis of unspecified type of vessel, native or graft    multiple angioplasy procedures in 90's/early 2000's   De Quervain's tenosynovitis    Depressive disorder, not elsewhere classified    Female bladder prolapse    Lichen 2011   Vaginal   Other and unspecified hyperlipidemia    Unspecified essential hypertension    Unspecified hypothyroidism    Past Surgical History:  Procedure Laterality Date   MOUTH SURGERY     NOSE SURGERY  1999   TOTAL VAGINAL HYSTERECTOMY  1994     reports that she quit smoking about 32 years ago. Her smoking use included cigarettes. She has never used smokeless tobacco. She reports that she does not drink alcohol and does not use drugs. family history includes Coronary artery disease in an other family member; Hypertension in her father, mother, and another family member. Allergies  Allergen Reactions   Epinephrine Other (See Comments)    unknown   Isosorbide Mononitrate Other (See Comments)    Severe headache   Lidocaine Nausea And Vomiting   Morphine Other (See Comments)    unknown   Propoxyphene N-Acetaminophen Other (See Comments)    unknown   Alprazolam Other (See Comments)    REACTION: very sedating   Atorvastatin Other (See Comments)    REACTION: weakness   Codeine Nausea And Vomiting   Ezetimibe Other (See Comments)    REACTION: myalgia   Prilosec [Omeprazole] Diarrhea and Other (See Comments)    Chest discomfort   Rosuvastatin Other (See Comments)    REACTION: leg weakness   Simvastatin Other (See Comments)    REACTION: leg  weakness   Allopurinol     ?bruising per pt   Clonidine Derivatives     whoozy  Meloxicam     diarrhea   Metronidazole     Abd cramps   Tetracycline Nausea And Vomiting   Triple Antibiotic [Bacitracin-Neomycin-Polymyxin]     rash   Amoxicillin Nausea And Vomiting    Can take Ampicillin   Ciprofloxacin Nausea And Vomiting   Diphenhydramine Hcl Anxiety   Current Outpatient Medications on File Prior to Visit  Medication Sig Dispense Refill   aspirin EC 81 MG tablet Take 1 tablet (81 mg total) by mouth 2 (two) times daily.     Cholecalciferol 1000 UNITS tablet Take 1,000 Units by mouth daily.     clobetasol ointment (TEMOVATE) 0.05 % Apply 1 application. topically 2 (two) times daily as needed. 30 g 3   clorazepate (TRANXENE) 15 MG tablet TAKE 1 TABLET BY MOUTH THREE TIMES DAILY AS NEEDED FOR ANXIETY. DO  NOT  TAKE  WITH  LORAZEPAM. 270 tablet 1   diphenoxylate-atropine (LOMOTIL)  2.5-0.025 MG tablet Take 1 tablet by mouth 4 (four) times daily as needed for diarrhea or loose stools. 40 tablet 1   doxepin (SINEQUAN) 50 MG capsule Take 3 capsules (150 mg total) by mouth at bedtime. 90 capsule 3   estradiol (ESTRACE) 0.1 MG/GM vaginal cream Place 1 Applicatorful vaginally as needed (irritation). 42.5 g 3   estradiol cypionate (DEPO-ESTRADIOL) 5 MG/ML injection Use 0.3 ml every 18-25 days IM 5 mL 3   furosemide (LASIX) 40 MG tablet Take 1 tablet (40 mg total) by mouth 2 (two) times daily. 180 tablet 3   levothyroxine (SYNTHROID) 100 MCG tablet Start Levothyroxine 100 mcg every other day and 50 mcg every other day. 90 tablet 3   LORazepam (ATIVAN) 0.5 MG tablet Take 1 tablet (0.5 mg total) by mouth at bedtime. 90 tablet 1   metoprolol tartrate (LOPRESSOR) 50 MG tablet Take 1.5 tablets (75 mg total) by mouth 3 (three) times daily. 405 tablet 3   NIFEdipine (PROCARDIA XL) 60 MG 24 hr tablet Take 1 tablet (60 mg total) by mouth daily. 90 tablet 3   nitrofurantoin, macrocrystal-monohydrate, (MACROBID) 100 MG capsule Take 1 capsule (100 mg total) by mouth 2 (two) times daily. 10 capsule 0   nitroGLYCERIN (NITROLINGUAL) 0.4 MG/SPRAY spray PLACE 1 SPRAY UNDER THE TONGUE EVERY 5 MINUTES AS NEEDED FOR CHEST PAIN. MAY REPEAT 3 TIMES 4.9 g 6   potassium chloride SA (KLOR-CON M) 20 MEQ tablet Take 1 tablet (20 mEq total) by mouth daily. 90 tablet 3   triamcinolone ointment (KENALOG) 0.1 % Apply 1 Application topically 4 (four) times daily as needed. On hemorrhoids 80 g 2   ampicillin (PRINCIPEN) 500 MG capsule Take 1 capsule (500 mg total) by mouth 4 (four) times daily. 28 capsule 1   No current facility-administered medications on file prior to visit.        ROS:  All others reviewed and negative.  Objective        PE:  BP (!) 170/90 (BP Location: Right Arm, Patient Position: Sitting, Cuff Size: Normal)   Pulse 67   Temp 98 F (36.7 C) (Oral)   Ht 5\' 6"  (1.676 m)   Wt 156 lb (70.8  kg)   SpO2 98%   BMI 25.18 kg/m                 Constitutional: Pt appears in NAD               HENT: Head: NCAT.  Right Ear: External ear normal.                 Left Ear: External ear normal.                Eyes: . Pupils are equal, round, and reactive to light. Conjunctivae and EOM are normal               Nose: without d/c or deformity               Neck: Neck supple. Gross normal ROM               Cardiovascular: Normal rate and regular rhythm.                 Pulmonary/Chest: Effort normal and breath sounds without rales or wheezing.                               Neurological: Pt is alert. At baseline orientation, motor grossly intact               Skin: Skin is warm. No rashes, no other new lesions, LE edema - none;  has marked tender to the gracilis area right upper leg, and medial knee without joint line tenderness, small right knee effusion, and trace to 1+ nontender swelling to the leg below the knee, o/w neurovasc intact               Psychiatric: Pt behavior is normal without agitation   Micro: none  Cardiac tracings I have personally interpreted today:  none  Pertinent Radiological findings (summarize): none   Lab Results  Component Value Date   WBC 6.5 02/18/2023   HGB 12.3 02/18/2023   HCT 37.9 02/18/2023   PLT 249.0 02/18/2023   GLUCOSE 94 02/18/2023   CHOL 237 (H) 02/18/2023   TRIG 138.0 02/18/2023   HDL 66.00 02/18/2023   LDLDIRECT 188.2 01/10/2012   LDLCALC 143 (H) 02/18/2023   ALT 9 02/18/2023   AST 13 02/18/2023   NA 140 02/18/2023   K 4.0 02/18/2023   CL 103 02/18/2023   CREATININE 1.39 (H) 02/18/2023   BUN 15 02/18/2023   CO2 29 02/18/2023   TSH 1.22 02/18/2023   INR 1.0 08/01/2018   Assessment/Plan:  [name] is a 78 y.o. White or Caucasian [1] female with  has a past medical history of Anxiety state, unspecified, Bronchitis, acute, Carotid bruit, Coronary atherosclerosis of unspecified type of vessel, native or graft, De  Quervain's tenosynovitis, Depressive disorder, not elsewhere classified, Female bladder prolapse, Lichen (2011), Other and unspecified hyperlipidemia, Unspecified essential hypertension, and Unspecified hypothyroidism.  Essential hypertension BP Readings from Last 3 Encounters:  08/29/23 (!) 170/90  08/29/23 (!) 170/90  08/21/23 (!) 190/82   uncontrolled, pt to continue medical treatment lopressor 75 tid, procardia xl 60 every day as declines change today   Leg pain, anterior, right I suspect a torque injury to the right medial knee and upper medial leg, likely msk tearing given the severity and chronicity - will hold on knee film but refer to Sport Med for further eval likely ultrasound  Pain and swelling of right lower leg I think less likely  but can't r/o dvt - for RLE venous dopplers  Bursitis of right hip Incidental, mild to mod, also for sport med referral for likely cortisone  Followup: Return if symptoms worsen or fail to improve.  Oliver Barre,  MD 08/29/2023 8:24 PM [facility]

## 2023-08-29 NOTE — Assessment & Plan Note (Signed)
 I suspect a torque injury to the right medial knee and upper medial leg, likely msk tearing given the severity and chronicity - will hold on knee film but refer to Sport Med for further eval likely ultrasound

## 2023-08-29 NOTE — Patient Instructions (Addendum)
 Thank you for coming in today.   Please get an Xray today before you leave   I've referred you to Physical Therapy.  Let us know if you don't hear from them in one week.   You have a Vascular Ultrasound scheduled for:  [facility] at [facility] [address] [place], [place] [address] Phone: [phone]    See you back in 1 month

## 2023-08-29 NOTE — Progress Notes (Signed)
 I, Stevenson Clinch, CMA acting as a Neurosurgeon for Clementeen Graham, MD.  [name] is a 78 y.o. female who presents to [facility] at [facility] today for right medial thigh and pain and swelling in the right lower leg and foot.  She has a burning sensation into the medial and lateral thigh extending into the lower leg.  Pain is worse with activity.  She does not have much pain interfering with sleep.  This has been ongoing for about 3 months and occurring after an injury.  She slipped and fell getting into her car about 3 months ago on the ice.   Pertinent review of systems: .  No fevers or chills  Relevant historical information: Hypertension.  GERD.  CKD Patient is not a very good historian.  Exam:  BP (!) 170/90   Pulse 67   Ht 5\' 6"  (1.676 m)   Wt 156 lb (70.8 kg)   SpO2 98%   BMI 25.18 kg/m  General: Well Developed, well nourished, and in no acute distress.   MSK: L-spine nontender palpation midline decreased lumbar motion. Lower extremity strength intact. Reflexes are intact. Right thigh and hip tender palpation medial thigh.  Normal hip motion.  Lower leg mild edema.    Lab and Radiology Results  X-ray images L-spine and right hip obtained today personally and independently interpreted.  Lumbar spine: DDD lower portion lumbar spine.  No acute fractures are visible.  Right hip: Right hip DJD.  Possible avascular necrosis.  No acute fractures are visible.  Await formal radiology review    Assessment and Plan: 78 y.o. female with right thigh and leg pain.  This has been ongoing for about 3 months.  She fell slipping on ice getting into her car about 3 months ago injuring her thigh.  She continues to experience some medial thigh pain but has developed a burning painful sensation along the lateral upper leg extending down of the posterior medial and lateral lower leg.   Pain is multifactorial and hard to pin down right now.  There is some component of  hip adductor injury and there is a component of lumbar radiculopathy.  Distribution of burning sensation is consistent with L5 and a bit with L4 and S1 on the right side.  Plan for PT and recheck in a month.   PDMP not reviewed this encounter. Orders Placed This Encounter  Procedures   [place] LIMITED JOINT SPACE STRUCTURES LOW RIGHT(NO LINKED CHARGES)    Reason for Exam (SYMPTOM  OR DIAGNOSIS REQUIRED):   right LE pain and swelling    Preferred imaging location?:   [facility]   DG HIP UNILAT W OR W/O PELVIS 2-3 VIEWS RIGHT    Standing Status:   Future    Number of Occurrences:   1    Expiration Date:   09/28/2023    Reason for Exam (SYMPTOM  OR DIAGNOSIS REQUIRED):   R LE pain and swelling    Preferred imaging location?:   [place] [facility]   DG Lumbar Spine 2-3 Views    Standing Status:   Future    Number of Occurrences:   1    Expiration Date:   09/28/2023    Reason for Exam (SYMPTOM  OR DIAGNOSIS REQUIRED):   lumbar radiculopathy    Preferred imaging location?:   [facility]   Ambulatory referral to Physical Therapy    Referral Priority:   Routine    Referral Type:  Physical Medicine    Referral Reason:   Specialty Services Required    Requested Specialty:   Physical Therapy    Number of Visits Requested:   1   No orders of the defined types were placed in this encounter.    Discussed warning signs or symptoms. Please see discharge instructions. Patient expresses understanding.   The above documentation has been reviewed and is accurate and complete Clementeen Graham, M.D.  Total encounter time 30 minutes including face-to-face time with the patient and, reviewing past medical record, and charting on the date of service.

## 2023-08-29 NOTE — Assessment & Plan Note (Signed)
 I think less likely  but can't r/o dvt - for RLE venous dopplers

## 2023-08-29 NOTE — Telephone Encounter (Signed)
 Copied from CRM [id number]. Topic: Clinical - Red Word Triage >> Aug 29, 2023  8:01 AM Aletta Edouard wrote: Red Word that prompted transfer to Nurse Triage: patient hurt her right leg and stated that her leg is feeling hot not to the touch but a burning sensation  Chief Complaint: right leg pain; hx fall in Jan. Symptoms: pain Frequency: constant Pertinent Negatives: Patient denies fever, numbness, weakness Disposition: [] ED /[] Urgent Care (no appt availability in office) / [x] Appointment(In office/virtual)/ []  [facility]/ [] Home Care/ [] Refused Recommended Disposition /[] [facility]/ []  Follow-up with PCP Additional Notes: per protocol apt made for today; care advice given, denies questions; instructed to go to ER if becomes worse.   Reason for Disposition  [1] SEVERE pain (e.g., excruciating, unable to do any normal activities) AND [2] not improved after 2 hours of pain medicine  Answer Assessment - Initial Assessment Questions 1. ONSET: "When did the pain start?"      Right leg; fell on ice in Jan.  2. LOCATION: "Where is the pain located?"      Right leg pain 3. PAIN: "How bad is the pain?"    (Scale 1-10; or mild, moderate, severe)   -  MILD (1-3): doesn't interfere with normal activities    -  MODERATE (4-7): interferes with normal activities (e.g., work or school) or awakens from sleep, limping    -  SEVERE (8-10): excruciating pain, unable to do any normal activities, unable to walk     States it's a burning, feels like a fever in the whole leg 4. WORK OR EXERCISE: "Has there been any recent work or exercise that involved this part of the body?"      Had a fall in Jan.  5. CAUSE: "What do you think is causing the leg pain?"     Previous injury 6. OTHER SYMPTOMS: "Do you have any other symptoms?" (e.g., chest pain, back pain, breathing difficulty, swelling, rash, fever, numbness, weakness)     Right foot swollen 7. PREGNANCY: "Is there any chance you are  pregnant?" "When was your last menstrual period?"     na  Protocols used: Leg Pain-A-AH

## 2023-08-29 NOTE — Assessment & Plan Note (Signed)
 Incidental, mild to mod, also for sport med referral for likely cortisone

## 2023-08-30 ENCOUNTER — Ambulatory Visit (HOSPITAL_COMMUNITY)
Admission: RE | Admit: 2023-08-30 | Discharge: 2023-08-30 | Disposition: A | Discharge status: Home / Self Care | Source: Ambulatory Visit | Attending: Internal Medicine | Admitting: Internal Medicine

## 2023-08-30 DIAGNOSIS — M7989 Other specified soft tissue disorders: Secondary | ICD-10-CM | POA: Diagnosis not present

## 2023-08-30 DIAGNOSIS — M7071 Other bursitis of hip, right hip: Secondary | ICD-10-CM | POA: Insufficient documentation

## 2023-08-30 DIAGNOSIS — M79604 Pain in right leg: Secondary | ICD-10-CM | POA: Insufficient documentation

## 2023-08-30 DIAGNOSIS — M79661 Pain in right lower leg: Secondary | ICD-10-CM | POA: Diagnosis not present

## 2023-09-04 ENCOUNTER — Ambulatory Visit (INDEPENDENT_AMBULATORY_CARE_PROVIDER_SITE_OTHER): Admitting: Physical Therapy

## 2023-09-04 ENCOUNTER — Ambulatory Visit: Admitting: Physical Therapy

## 2023-09-04 DIAGNOSIS — M79604 Pain in right leg: Secondary | ICD-10-CM

## 2023-09-04 DIAGNOSIS — M6281 Muscle weakness (generalized): Secondary | ICD-10-CM | POA: Diagnosis not present

## 2023-09-04 DIAGNOSIS — R2689 Other abnormalities of gait and mobility: Secondary | ICD-10-CM

## 2023-09-04 NOTE — Therapy (Addendum)
 OUTPATIENT PHYSICAL THERAPY EVALUATION  DISCHARGE   Patient Name: [name] MRN: [id number] DOB:05-08-1946, 78 y.o., female Today's Date: 09/05/2023   END OF SESSION:  PT End of Session - 09/05/23 1115     Visit Number 1    Number of Visits 9    Date for PT Re-Evaluation 10/30/23    Authorization Type [employer] MCR    PT Start Time 1610    PT Stop Time 1650    PT Time Calculation (min) 40 min    Activity Tolerance Patient tolerated treatment well    Behavior During Therapy WFL for tasks assessed/performed             Past Medical History:  Diagnosis Date   Anxiety state, unspecified    Bronchitis, acute    Carotid bruit    Coronary atherosclerosis of unspecified type of vessel, native or graft    multiple angioplasy procedures in 90's/early 2000's   De Quervain's tenosynovitis    Depressive disorder, not elsewhere classified    Female bladder prolapse    Lichen 2011   Vaginal   Other and unspecified hyperlipidemia    Unspecified essential hypertension    Unspecified hypothyroidism    Past Surgical History:  Procedure Laterality Date   MOUTH SURGERY     NOSE SURGERY  1999   TOTAL VAGINAL HYSTERECTOMY  1994   Patient Active Problem List   Diagnosis Date Noted   Pain and swelling of right lower leg 08/29/2023   Bursitis of right hip 08/29/2023   Stress 08/21/2023   Hip pain, chronic 08/21/2023   Change in voice 03/14/2023   Diarrhea 08/22/2022   Hemorrhoids 08/22/2022   Hypertensive urgency 03/28/2022   Macular degeneration of left eye 12/21/2021   Statin intolerance 08/31/2021   Grief 07/04/2021   Dry skin 07/04/2021   Stage 3a chronic kidney disease (HCC) 06/06/2021   Family history of giardiasis 05/03/2021   UTI (urinary tract infection) 01/12/2021   Dog bite, hand, left, subsequent encounter 09/28/2020   Arthritis of left knee 07/13/2020   Pain and swelling of right knee 06/05/2020   Left knee pain 06/05/2020   Hypokalemia 05/18/2020   Rash  03/24/2020   Insomnia 02/25/2020   Traumatic hematoma of buttock 12/31/2019   Leg pain, anterior, right 11/20/2019   Bladder prolapse, female, acquired 06/25/2019   Statin myopathy 04/18/2019   Dog bite of arm 02/15/2019   Right arm pain 02/06/2019   Atrophic vaginitis 10/20/2018   Urinary bladder stone 08/06/2018   Hematuria, gross 08/06/2018   Edema 04/21/2018   Herpes zoster 02/20/2018   Occipital neuralgia of left side 01/07/2018   Hormone replacement therapy (HRT) 07/25/2017   Hair loss 05/31/2017   Upper respiratory infection 05/01/2017   Achilles tendinitis 03/12/2017   Gout 01/09/2017   Shoulder effusion, right 12/28/2016   Neck stiffness 12/28/2016   Heel pain, chronic, left 12/11/2016   Calcaneal bursitis (heel), left 11/13/2016   Toenail avulsion, initial encounter 11/13/2016   Traumatic hematoma of foot, right, sequela 09/27/2016   Adnexal mass 01/04/2016   Right adrenal mass (HCC) 12/29/2015   Cataract 10/10/2015   Abnormal abdominal x-ray 12/23/2014   Epigastric pain 11/07/2012   GERD (gastroesophageal reflux disease) 11/07/2012   Hot flash, menopausal 09/10/2012   Well adult exam 06/13/2011   Gait disorder 11/22/2009   PARESTHESIA 11/22/2009   BACK PAIN, LUMBAR 11/03/2009   Hypothyroidism 02/11/2007   Dyslipidemia 02/11/2007   Anxiety disorder 02/11/2007   Depression 02/11/2007  Essential hypertension 02/11/2007   Coronary artery disease involving native coronary artery with angina pectoris (HCC) 02/11/2007   DE QUERVAIN'S TENOSYNOVITIS 02/11/2007   CAROTID BRUIT 02/11/2007    PCP: Garald Karlynn GAILS, MD  REFERRING PROVIDER: Joane Artist RAMAN, MD  REFERRING DIAG: Lumbar radiculopathy  Rationale for Evaluation and Treatment: Rehabilitation  THERAPY DIAG:  Pain in right leg  Other abnormalities of gait and mobility  Muscle weakness (generalized)  ONSET DATE: June 07, 2023   SUBJECTIVE SUBJECTIVE STATEMENT: Patient reports she was trying  to get out of the car after it snowed in January and the ground was icy beside her car. She was starting to fall out of the car so she reached back to grab on with the left arm and the right leg was twisted back. She was finally able to get up and there pressure put on the inside of the right knee. She states it didn't hurt initially until about 3 days later when the left shoulder started, which resolve within a week or two. The right leg started to hurt after the 5-6th day where she was having pulling and burning sensation on the inside of the right knee and thigh. She reports no redness or bruising after the injury. She was using a heating pad but that made the pain on the inside of the thigh more intense, so she started using a cold pack which felt good, and she has been taking Tylenol. She does come in today reporting improvement in her ability to lift her left leg. She does feel some burning in back of the right lower leg and knee, but the main issue is from the inside of the right knee and up the inside of the right thigh. Her right foot is still swollen. She has also noticed some tinging in her feet sometimes that has been going on for a long time even before the injury.   PERTINENT HISTORY:  See PMH above  PAIN:  Are you having pain? Yes:  NPRS scale: 6/10 currently, 8/10 at worst Pain location: Right medial knee and thigh Pain description: Burning, tight Aggravating factors: Walking, activity Relieving factors: Medication, ice, massager at home  PRECAUTIONS: Fall  RED FLAGS: None   WEIGHT BEARING RESTRICTIONS: No  FALLS:  Has patient fallen in last 6 months? Yes. Number of falls 1  LIVING ENVIRONMENT: Lives with: lives alone Lives in: House/apartment  PLOF: Independent  PATIENT GOALS: Pain relief to return to prior level of function   OBJECTIVE:  Note: Objective measures were completed at Evaluation unless otherwise noted. PATIENT SURVEYS:  LEFS  16/80  COGNITION: Overall cognitive status: Within functional limits for tasks assessed    SENSATION: WFL  MUSCLE LENGTH: Not assessed  POSTURE:  Slight forward trunk lean and weight shift left, sits with right leg slightly abducted  PALPATION: Patient reports tenderness along the right medial knee and thigh (adductor region), and proximal calf region  LUMBAR ROM:   AROM eval  Flexion   Extension   Right lateral flexion   Left lateral flexion   Right rotation   Left rotation    (Blank rows = not tested)  LOWER EXTREMITY ROM:     ROM  Right eval Left eval  Hip flexion    Hip extension    Hip abduction    Hip adduction    Hip internal rotation    Hip external rotation    Knee flexion    Knee extension    Ankle dorsiflexion  Ankle plantarflexion    Ankle inversion    Ankle eversion     (Blank rows = not tested)  LOWER EXTREMITY MMT:    MMT Right eval Left eval  Hip flexion    Hip extension    Hip abduction    Hip adduction    Hip internal rotation    Hip external rotation    Knee flexion    Knee extension    Ankle dorsiflexion    Ankle plantarflexion    Ankle inversion    Ankle eversion     (Blank rows = not tested)  LUMBAR SPECIAL TESTS:  Not assessed  FUNCTIONAL TESTS:  Sit to stand: patient requires use of UE to stand from chair, decreased weight placed through right LE  GAIT: Distance walked: 50 ft Assistive device utilized: None Level of assistance: Complete Independence Comments: Antalgic on right with step-to gait pattern   TREATMENT [facility] Adult PT Treatment:                                                DATE: 09/04/2023 Education provided on massage tool patient brought in for home, switched attachments for a broader pressure and educated on duration and frequency of use.   PATIENT EDUCATION:  Education details: Exam findings, POC Person educated: Patient Education method: Explanation, Demonstration, Tactile cues, and Verbal  cues Education comprehension: verbalized understanding, returned demonstration, verbal cues required, tactile cues required, and needs further education  HOME EXERCISE PROGRAM: None provided at eval   ASSESSMENT: CLINICAL IMPRESSION: Patient is a 78 y.o. female who was seen today for physical therapy evaluation and treatment for chronic right leg pain that seems to be consistent with adductor strain that has affected her functional mobility, walking, and activity tolerance. She reports initial injury occurred when the right leg was placed in and abducted, extended, internally rotated position at the hip and valgus and external rotation force placed at the knee. Examination limited due to extensive history provided by the patient. She does exhibit limitations with her gait and transfers with standing from chair. Based on observation she exhibits some limitations with her right hip motion and right leg strength, but these were not formally assessed. She does report recent improvement in her ability to move the right hip. She brought in a massage tool from home and she was encouraged to use this along the adductor region and was educated on duration and frequency of use.  OBJECTIVE IMPAIRMENTS: Abnormal gait, decreased activity tolerance, decreased balance, decreased mobility, difficulty walking, decreased ROM, decreased strength, improper body mechanics, postural dysfunction, and pain.   ACTIVITY LIMITATIONS: carrying, lifting, sitting, standing, squatting, stairs, transfers, dressing, and locomotion level  PARTICIPATION LIMITATIONS: meal prep, cleaning, laundry, shopping, and community activity  PERSONAL FACTORS: Fitness, Past/current experiences, and Time since onset of injury/illness/exacerbation are also affecting patient's functional outcome.   REHAB POTENTIAL: Good  CLINICAL DECISION MAKING: Stable/uncomplicated  EVALUATION COMPLEXITY: Low   GOALS: Goals reviewed with patient?  Yes  SHORT TERM GOALS: Target date: 10/02/2023  Patient will be I with initial HEP in order to progress with therapy. Baseline: None provided at eval Goal status: INITIAL  2.  Patient will report right leg pain </= 4/10 in order to reduce functional limitations Baseline: 6-8/10 pain Goal status: INITIAL  3.  Patient will be able to perform sit to stand from standard  chair without use of UE for support to indicate improvement in strength and mobility for transfers Baseline: patient uses UE for support to stand Goal status: INITIAL  LONG TERM GOALS: Target date: 10/30/2023  Patient will be I with final HEP to maintain progress from PT. Baseline: None provided at eval Goal status: INITIAL  2.  Patient will report LEFS >/= 50/80 in order to indicate an improvement in her functional ability Baseline: 16/80 Goal status: INITIAL  3.  Patient will demonstrate right hip strength >/= 4-/5 MMT and knee strength 5/5 MMT in order to improve her walking and activity tolerance Baseline: not formally assessed at eval Goal status: INITIAL  4.  Patient will demonstrate right hip AROM equal to left to improve her mobility and reduce pain with activity Baseline: not formally assessed at eval Goal status: INITIAL  5. Patient will report right leg pain </= 2/10 in order to reduce functional limitations  Baseline: 6-8/10  Goal status: INITIAL   PLAN: PT FREQUENCY: 1x/week  PT DURATION: 8 weeks  PLANNED INTERVENTIONS: 97164- PT Re-evaluation, 97110-Therapeutic exercises, 97530- Therapeutic activity, 97112- Neuromuscular re-education, 97535- Self Care, [id number]- Manual therapy, [id number]- Gait training, Patient/Family education, Balance training, Stair training, Dry Needling, Joint mobilization, Joint manipulation, Spinal manipulation, Spinal mobilization, Cryotherapy, and Moist heat.  PLAN FOR NEXT SESSION: Review HEP and progress PRN, further assess right hip/knee motion and strength as able, address  any mobility deficits of the right hip with manual/stretch, progress right hip and LE strength, balance and gait training, assess lumbar spine as indicated   Elaine Daring, PT, DPT, LAT, ATC 09/05/23  12:28 PM Phone: [phone] Fax: [phone]   PHYSICAL THERAPY DISCHARGE SUMMARY  Visits from Start of Care: 1  Current functional level related to goals / functional outcomes: See above   Remaining deficits: See above   Education / Equipment: HEP   Patient agrees to discharge. Patient goals were not met. Patient is being discharged due to not returning since the last visit.   Elaine Daring, PT, DPT, LAT, ATC 03/11/24  11:22 AM Phone: [phone] Fax: [phone]

## 2023-09-05 ENCOUNTER — Other Ambulatory Visit: Payer: Self-pay

## 2023-09-05 ENCOUNTER — Encounter: Payer: Self-pay | Admitting: Physical Therapy

## 2023-09-09 NOTE — Progress Notes (Signed)
 Low back x-ray shows severe arthritis changes at the base of the spine.

## 2023-09-09 NOTE — Progress Notes (Signed)
 Right knee x-ray shows mild arthritis.

## 2023-09-09 NOTE — Progress Notes (Signed)
 Right hip x-ray shows medium arthritis of both hips right is worse than left.

## 2023-09-12 ENCOUNTER — Encounter: Admitting: Physical Therapy

## 2023-09-18 ENCOUNTER — Encounter: Payer: Self-pay | Admitting: Internal Medicine

## 2023-09-19 ENCOUNTER — Encounter: Admitting: Physical Therapy

## 2023-09-23 ENCOUNTER — Ambulatory Visit: Admitting: Internal Medicine

## 2023-09-23 ENCOUNTER — Encounter: Payer: Self-pay | Admitting: Internal Medicine

## 2023-09-23 VITALS — BP 162/78 | HR 61 | Temp 97.8°F | Ht 66.0 in | Wt 156.8 lb

## 2023-09-23 DIAGNOSIS — Z79890 Hormone replacement therapy (postmenopausal): Secondary | ICD-10-CM | POA: Diagnosis not present

## 2023-09-23 DIAGNOSIS — R269 Unspecified abnormalities of gait and mobility: Secondary | ICD-10-CM | POA: Diagnosis not present

## 2023-09-23 DIAGNOSIS — M79604 Pain in right leg: Secondary | ICD-10-CM

## 2023-09-23 MED ORDER — METHYLPREDNISOLONE 4 MG PO TBPK: ORAL_TABLET | ORAL | 0 refills | Status: DC

## 2023-09-23 MED ORDER — ESTRADIOL CYPIONATE 5 MG/ML IM OIL
1.0000 mg | TOPICAL_OIL | Freq: Once | INTRAMUSCULAR | Status: AC
  Administered 2023-09-23: 1 mg via INTRAMUSCULAR

## 2023-09-23 NOTE — Assessment & Plan Note (Signed)
 C/o RLE chronic pain:  pain started after a fall on ice - tried PT Sprain of inner thigh liaments Medrol  pack Heat

## 2023-09-23 NOTE — Progress Notes (Signed)
 Subjective:  Patient ID: [name], female    DOB: 18-Aug-1945  Age: 78 y.o. MRN: [id number]  CC: Medical Management of Chronic Issues (4 week follow up. Patient notes of ongoing right leg pain, with heat, swelling present)   HPI [name] presents for HRT C/o RLE chronic pain:  pain started after a fall on ice - tried PT  Outpatient Medications Prior to Visit  Medication Sig Dispense Refill   ampicillin  (PRINCIPEN) 500 MG capsule Take 1 capsule (500 mg total) by mouth 4 (four) times daily. 28 capsule 1   aspirin  EC 81 MG tablet Take 1 tablet (81 mg total) by mouth 2 (two) times daily.     Cholecalciferol 1000 UNITS tablet Take 1,000 Units by mouth daily.     clobetasol  ointment (TEMOVATE ) 0.05 % Apply 1 application. topically 2 (two) times daily as needed. 30 g 3   clorazepate  (TRANXENE ) 15 MG tablet TAKE 1 TABLET BY MOUTH THREE TIMES DAILY AS NEEDED FOR ANXIETY. DO  NOT  TAKE  WITH  LORAZEPAM . 270 tablet 1   diphenoxylate -atropine  (LOMOTIL ) 2.5-0.025 MG tablet Take 1 tablet by mouth 4 (four) times daily as needed for diarrhea or loose stools. 40 tablet 1   doxepin  (SINEQUAN ) 50 MG capsule Take 3 capsules (150 mg total) by mouth at bedtime. 90 capsule 3   estradiol  (ESTRACE ) 0.1 MG/GM vaginal cream Place 1 Applicatorful vaginally as needed (irritation). 42.5 g 3   estradiol  cypionate (DEPO-ESTRADIOL ) 5 MG/ML injection Use 0.3 ml every 18-25 days IM 5 mL 3   furosemide  (LASIX ) 40 MG tablet Take 1 tablet (40 mg total) by mouth 2 (two) times daily. 180 tablet 3   levothyroxine  (SYNTHROID ) 100 MCG tablet Start Levothyroxine  100 mcg every other day and 50 mcg every other day. 90 tablet 3   LORazepam  (ATIVAN ) 0.5 MG tablet Take 1 tablet (0.5 mg total) by mouth at bedtime. 90 tablet 1   metoprolol  tartrate (LOPRESSOR ) 50 MG tablet Take 1.5 tablets (75 mg total) by mouth 3 (three) times daily. 405 tablet 3   NIFEdipine  (PROCARDIA  XL) 60 MG 24 hr tablet Take 1 tablet (60 mg total) by mouth  daily. 90 tablet 3   nitrofurantoin , macrocrystal-monohydrate, (MACROBID ) 100 MG capsule Take 1 capsule (100 mg total) by mouth 2 (two) times daily. 10 capsule 0   nitroGLYCERIN  (NITROLINGUAL ) 0.4 MG/SPRAY spray PLACE 1 SPRAY UNDER THE TONGUE EVERY 5 MINUTES AS NEEDED FOR CHEST PAIN. MAY REPEAT 3 TIMES 4.9 g 6   potassium chloride  SA (KLOR-CON  M) 20 MEQ tablet Take 1 tablet (20 mEq total) by mouth daily. 90 tablet 3   triamcinolone  ointment (KENALOG ) 0.1 % Apply 1 Application topically 4 (four) times daily as needed. On hemorrhoids 80 g 2   No facility-administered medications prior to visit.    ROS: Review of Systems  Constitutional:  Negative for activity change, appetite change, chills, fatigue and unexpected weight change.  HENT:  Negative for congestion, mouth sores and sinus pressure.   Eyes:  Negative for visual disturbance.  Respiratory:  Negative for cough and chest tightness.   Gastrointestinal:  Negative for abdominal pain and nausea.  Genitourinary:  Negative for difficulty urinating, frequency and vaginal pain.  Musculoskeletal:  Positive for arthralgias, back pain and gait problem.  Skin:  Negative for pallor and rash.  Neurological:  Negative for dizziness, tremors, weakness, numbness and headaches.  Psychiatric/Behavioral:  Negative for confusion, sleep disturbance and suicidal ideas.     Objective:  BP [name])  162/78   Pulse 61   Temp 97.8 F (36.6 C)   Ht 5\' 6"  (1.676 m)   Wt 156 lb 12.8 oz (71.1 kg)   SpO2 97%   BMI 25.31 kg/m   BP Readings from Last 3 Encounters:  09/23/23 (!) 162/78  08/29/23 (!) 170/90  08/29/23 (!) 170/90    Wt Readings from Last 3 Encounters:  09/23/23 156 lb 12.8 oz (71.1 kg)  08/29/23 156 lb (70.8 kg)  08/29/23 156 lb (70.8 kg)    Physical Exam Constitutional:      General: She is not in acute distress.    Appearance: Normal appearance. She is well-developed.  HENT:     Head: Normocephalic.     Right Ear: External ear normal.      Left Ear: External ear normal.     Nose: Nose normal.  Eyes:     General:        Right eye: No discharge.        Left eye: No discharge.     Conjunctiva/sclera: Conjunctivae normal.     Pupils: Pupils are equal, round, and reactive to light.  Neck:     Thyroid : No thyromegaly.     Vascular: No JVD.     Trachea: No tracheal deviation.  Cardiovascular:     Rate and Rhythm: Normal rate and regular rhythm.     Heart sounds: Normal heart sounds.  Pulmonary:     Effort: No respiratory distress.     Breath sounds: No stridor. No wheezing.  Abdominal:     General: Bowel sounds are normal. There is no distension.     Palpations: Abdomen is soft. There is no mass.     Tenderness: There is no abdominal tenderness. There is no guarding or rebound.  Musculoskeletal:        General: No tenderness.     Cervical back: Normal range of motion and neck supple. No rigidity.  Lymphadenopathy:     Cervical: No cervical adenopathy.  Skin:    Findings: No erythema or rash.  Neurological:     Cranial Nerves: No cranial nerve deficit.     Motor: No abnormal muscle tone.     Coordination: Coordination normal.     Deep Tendon Reflexes: Reflexes normal.  Psychiatric:        Behavior: Behavior normal.        Thought Content: Thought content normal.        Judgment: Judgment normal.   RLE NT  Lab Results  Component Value Date   WBC 6.5 02/18/2023   HGB 12.3 02/18/2023   HCT 37.9 02/18/2023   PLT 249.0 02/18/2023   GLUCOSE 94 02/18/2023   CHOL 237 (H) 02/18/2023   TRIG 138.0 02/18/2023   HDL 66.00 02/18/2023   LDLDIRECT 188.2 01/10/2012   LDLCALC 143 (H) 02/18/2023   ALT 9 02/18/2023   AST 13 02/18/2023   NA 140 02/18/2023   K 4.0 02/18/2023   CL 103 02/18/2023   CREATININE 1.39 (H) 02/18/2023   BUN 15 02/18/2023   CO2 29 02/18/2023   TSH 1.22 02/18/2023   INR 1.0 08/01/2018    VAS US  LOWER EXTREMITY VENOUS (DVT) Result Date: 09/18/2023  Lower Venous DVT Study Patient Name:   [name] [facility]  Date of Exam:   08/30/2023 Medical Rec #: [id number]     Accession #:    [id number] Date of Birth: May 14, 1946      Patient Gender: F Patient Age:   52  years Exam Location:  [facility] Procedure:      VAS US  LOWER EXTREMITY VENOUS (DVT) Referring Phys: Rosalia Colonel --------------------------------------------------------------------------------  Indications: Pain, Swelling, and s/p fall x 4 months.  Comparison Study: 04/22/2018 negative lower extremity venous duplex Performing Technologist: Delford Felling MHA, RDMS, RVT, RDCS  Examination Guidelines: A complete evaluation includes B-mode imaging, spectral Doppler, color Doppler, and power Doppler as needed of all accessible portions of each vessel. Bilateral testing is considered an integral part of a complete examination. Limited examinations for reoccurring indications may be performed as noted. The reflux portion of the exam is performed with the patient in reverse Trendelenburg.  +---------+---------------+---------+-----------+----------+--------------+ RIGHT    CompressibilityPhasicitySpontaneityPropertiesThrombus Aging +---------+---------------+---------+-----------+----------+--------------+ CFV      Full           Yes      Yes                                 +---------+---------------+---------+-----------+----------+--------------+ SFJ      Full                                                        +---------+---------------+---------+-----------+----------+--------------+ FV Prox  Full                                                        +---------+---------------+---------+-----------+----------+--------------+ FV Mid   Full           Yes      Yes                                 +---------+---------------+---------+-----------+----------+--------------+ FV DistalFull                                                         +---------+---------------+---------+-----------+----------+--------------+ PFV      Full                                                        +---------+---------------+---------+-----------+----------+--------------+ POP      Full           Yes      Yes                                 +---------+---------------+---------+-----------+----------+--------------+ PTV      Full                                                        +---------+---------------+---------+-----------+----------+--------------+ PERO  Full                                                        +---------+---------------+---------+-----------+----------+--------------+   +----+---------------+---------+-----------+----------+--------------+ LEFTCompressibilityPhasicitySpontaneityPropertiesThrombus Aging +----+---------------+---------+-----------+----------+--------------+ CFV Full           Yes      Yes                                 +----+---------------+---------+-----------+----------+--------------+     Summary: RIGHT: - There is no evidence of deep vein thrombosis in the lower extremity.  - No cystic structure found in the popliteal fossa.  LEFT: - No evidence of common femoral vein obstruction.   *See table(s) above for measurements and observations. Electronically signed by Genny Kid MD on 09/18/2023 at 8:05:32 PM.    Final     Assessment & Plan:   Problem List Items Addressed This Visit     Gait disorder   C/o RLE chronic pain:  pain started after a fall on ice - tried PT Sprain of inner thigh liaments Medrol  pack Heat      Hormone replacement therapy (HRT) - Primary   See injection adm. Risks/benefits of HRT discussed      Leg pain, medial, right   C/o RLE chronic pain:  pain started after a fall on ice - tried PT Sprain of inner thigh liaments Medrol  pack Heat         Meds ordered this encounter  Medications   estradiol  cypionate (DEPO-ESTRADIOL ) 5  MG/ML injection 1 mg   methylPREDNISolone  (MEDROL  DOSEPAK) 4 MG TBPK tablet    Sig: As directed    Dispense:  21 tablet    Refill:  0      Follow-up: No follow-ups on file.  Anitra Barn, MD

## 2023-09-23 NOTE — Assessment & Plan Note (Signed)
 See injection adm. Risks/benefits of HRT discussed

## 2023-09-25 ENCOUNTER — Encounter: Admitting: Physical Therapy

## 2023-09-30 ENCOUNTER — Ambulatory Visit: Admitting: Family Medicine

## 2023-10-03 ENCOUNTER — Encounter: Admitting: Physical Therapy

## 2023-10-22 ENCOUNTER — Encounter: Payer: Self-pay | Admitting: Internal Medicine

## 2023-10-22 ENCOUNTER — Ambulatory Visit (INDEPENDENT_AMBULATORY_CARE_PROVIDER_SITE_OTHER): Admitting: Internal Medicine

## 2023-10-22 VITALS — BP 130/70 | HR 62 | Temp 98.6°F | Ht 66.0 in | Wt 154.0 lb

## 2023-10-22 DIAGNOSIS — F419 Anxiety disorder, unspecified: Secondary | ICD-10-CM

## 2023-10-22 DIAGNOSIS — E034 Atrophy of thyroid (acquired): Secondary | ICD-10-CM | POA: Diagnosis not present

## 2023-10-22 DIAGNOSIS — Z79890 Hormone replacement therapy (postmenopausal): Secondary | ICD-10-CM

## 2023-10-22 DIAGNOSIS — G5700 Lesion of sciatic nerve, unspecified lower limb: Secondary | ICD-10-CM | POA: Insufficient documentation

## 2023-10-22 DIAGNOSIS — G5701 Lesion of sciatic nerve, right lower limb: Secondary | ICD-10-CM | POA: Diagnosis not present

## 2023-10-22 MED ORDER — ESTRADIOL CYPIONATE 5 MG/ML IM OIL
1.0000 mg | TOPICAL_OIL | Freq: Once | INTRAMUSCULAR | Status: AC
  Administered 2023-10-22: 1 mg via INTRAMUSCULAR

## 2023-10-22 NOTE — Progress Notes (Signed)
 Subjective:  Patient ID: [name], female    DOB: 1945-10-10  Age: 78 y.o. MRN: [id number]  CC: Medical Management of Chronic Issues (4 WEEK F/U)   HPI [name] presents for R leg pain, burning pain in the back of the whole leg x weeks. Feeling good in am. Pain comes later.   Outpatient Medications Prior to Visit  Medication Sig Dispense Refill   aspirin  EC 81 MG tablet Take 1 tablet (81 mg total) by mouth 2 (two) times daily.     Cholecalciferol 1000 UNITS tablet Take 1,000 Units by mouth daily.     clobetasol  ointment (TEMOVATE ) 0.05 % Apply 1 application. topically 2 (two) times daily as needed. 30 g 3   clorazepate  (TRANXENE ) 15 MG tablet TAKE 1 TABLET BY MOUTH THREE TIMES DAILY AS NEEDED FOR ANXIETY. DO  NOT  TAKE  WITH  LORAZEPAM . 270 tablet 1   diphenoxylate -atropine  (LOMOTIL ) 2.5-0.025 MG tablet Take 1 tablet by mouth 4 (four) times daily as needed for diarrhea or loose stools. 40 tablet 1   doxepin  (SINEQUAN ) 50 MG capsule Take 3 capsules (150 mg total) by mouth at bedtime. 90 capsule 3   estradiol  (ESTRACE ) 0.1 MG/GM vaginal cream Place 1 Applicatorful vaginally as needed (irritation). 42.5 g 3   estradiol  cypionate (DEPO-ESTRADIOL ) 5 MG/ML injection Use 0.3 ml every 18-25 days IM 5 mL 3   furosemide  (LASIX ) 40 MG tablet Take 1 tablet (40 mg total) by mouth 2 (two) times daily. 180 tablet 3   levothyroxine  (SYNTHROID ) 100 MCG tablet Start Levothyroxine  100 mcg every other day and 50 mcg every other day. 90 tablet 3   LORazepam  (ATIVAN ) 0.5 MG tablet Take 1 tablet (0.5 mg total) by mouth at bedtime. 90 tablet 1   methylPREDNISolone  (MEDROL  DOSEPAK) 4 MG TBPK tablet As directed 21 tablet 0   metoprolol  tartrate (LOPRESSOR ) 50 MG tablet Take 1.5 tablets (75 mg total) by mouth 3 (three) times daily. 405 tablet 3   NIFEdipine  (PROCARDIA  XL) 60 MG 24 hr tablet Take 1 tablet (60 mg total) by mouth daily. 90 tablet 3   nitrofurantoin , macrocrystal-monohydrate, (MACROBID ) 100 MG  capsule Take 1 capsule (100 mg total) by mouth 2 (two) times daily. 10 capsule 0   nitroGLYCERIN  (NITROLINGUAL ) 0.4 MG/SPRAY spray PLACE 1 SPRAY UNDER THE TONGUE EVERY 5 MINUTES AS NEEDED FOR CHEST PAIN. MAY REPEAT 3 TIMES 4.9 g 6   potassium chloride  SA (KLOR-CON  M) 20 MEQ tablet Take 1 tablet (20 mEq total) by mouth daily. 90 tablet 3   triamcinolone  ointment (KENALOG ) 0.1 % Apply 1 Application topically 4 (four) times daily as needed. On hemorrhoids 80 g 2   ampicillin  (PRINCIPEN) 500 MG capsule Take 1 capsule (500 mg total) by mouth 4 (four) times daily. (Patient not taking: Reported on 10/22/2023) 28 capsule 1   No facility-administered medications prior to visit.    ROS: Review of Systems  Constitutional:  Positive for fatigue. Negative for activity change, appetite change, chills and unexpected weight change.  HENT:  Negative for congestion, mouth sores and sinus pressure.   Eyes:  Negative for visual disturbance.  Respiratory:  Negative for cough and chest tightness.   Gastrointestinal:  Negative for abdominal pain and nausea.  Genitourinary:  Negative for difficulty urinating, frequency and vaginal pain.  Musculoskeletal:  Positive for arthralgias, back pain, gait problem and myalgias.  Skin:  Negative for pallor and rash.  Neurological:  Negative for dizziness, tremors, weakness, numbness and headaches.  Psychiatric/Behavioral:  Positive for dysphoric mood and sleep disturbance. Negative for confusion and suicidal ideas. The patient is nervous/anxious.     Objective:  BP 130/70   Pulse 62   Temp 98.6 F (37 C) (Oral)   Ht 5' 6 (1.676 m)   Wt 154 lb (69.9 kg)   SpO2 95%   BMI 24.86 kg/m   BP Readings from Last 3 Encounters:  10/22/23 130/70  09/23/23 (!) 162/78  08/29/23 (!) 170/90    Wt Readings from Last 3 Encounters:  10/22/23 154 lb (69.9 kg)  09/23/23 156 lb 12.8 oz (71.1 kg)  08/29/23 156 lb (70.8 kg)    Physical Exam Constitutional:      General: She is  not in acute distress.    Appearance: She is well-developed.  HENT:     Head: Normocephalic.     Right Ear: External ear normal.     Left Ear: External ear normal.     Nose: Nose normal.   Eyes:     General:        Right eye: No discharge.        Left eye: No discharge.     Conjunctiva/sclera: Conjunctivae normal.     Pupils: Pupils are equal, round, and reactive to light.   Neck:     Thyroid : No thyromegaly.     Vascular: No JVD.     Trachea: No tracheal deviation.   Cardiovascular:     Rate and Rhythm: Normal rate and regular rhythm.     Heart sounds: Normal heart sounds.  Pulmonary:     Effort: No respiratory distress.     Breath sounds: No stridor. No wheezing.  Abdominal:     General: Bowel sounds are normal. There is no distension.     Palpations: Abdomen is soft. There is no mass.     Tenderness: There is no abdominal tenderness. There is no guarding or rebound.   Musculoskeletal:        General: Tenderness present.     Cervical back: Normal range of motion and neck supple. No rigidity.     Right lower leg: No edema.     Left lower leg: No edema.  Lymphadenopathy:     Cervical: No cervical adenopathy.   Skin:    Findings: No erythema or rash.   Neurological:     Cranial Nerves: No cranial nerve deficit.     Motor: No abnormal muscle tone.     Coordination: Coordination normal.     Deep Tendon Reflexes: Reflexes normal.   Psychiatric:        Behavior: Behavior normal.        Thought Content: Thought content normal.        Judgment: Judgment normal.    R buttock is painful to palpation Straight leg elevation is negative bilaterally  Lab Results  Component Value Date   WBC 6.5 02/18/2023   HGB 12.3 02/18/2023   HCT 37.9 02/18/2023   PLT 249.0 02/18/2023   GLUCOSE 94 02/18/2023   CHOL 237 (H) 02/18/2023   TRIG 138.0 02/18/2023   HDL 66.00 02/18/2023   LDLDIRECT 188.2 01/10/2012   LDLCALC 143 (H) 02/18/2023   ALT 9 02/18/2023   AST 13 02/18/2023    NA 140 02/18/2023   K 4.0 02/18/2023   CL 103 02/18/2023   CREATININE 1.39 (H) 02/18/2023   BUN 15 02/18/2023   CO2 29 02/18/2023   TSH 1.22 02/18/2023   INR 1.0 08/01/2018  VAS US  LOWER EXTREMITY VENOUS (DVT) Result Date: 09/18/2023  Lower Venous DVT Study Patient Name:  V[facility]  Date of Exam:   08/30/2023 Medical Rec #: [id number]     Accession #:    [id number] Date of Birth: 1945/10/21      Patient Gender: F Patient Age:   51 years Exam Location:  [facility] Procedure:      VAS US  LOWER EXTREMITY VENOUS (DVT) Referring Phys: Rosalia Colonel --------------------------------------------------------------------------------  Indications: Pain, Swelling, and s/p fall x 4 months.  Comparison Study: 04/22/2018 negative lower extremity venous duplex Performing Technologist: Delford Felling MHA, RDMS, RVT, RDCS  Examination Guidelines: A complete evaluation includes B-mode imaging, spectral Doppler, color Doppler, and power Doppler as needed of all accessible portions of each vessel. Bilateral testing is considered an integral part of a complete examination. Limited examinations for reoccurring indications may be performed as noted. The reflux portion of the exam is performed with the patient in reverse Trendelenburg.  +---------+---------------+---------+-----------+----------+--------------+ RIGHT    CompressibilityPhasicitySpontaneityPropertiesThrombus Aging +---------+---------------+---------+-----------+----------+--------------+ CFV      Full           Yes      Yes                                 +---------+---------------+---------+-----------+----------+--------------+ SFJ      Full                                                        +---------+---------------+---------+-----------+----------+--------------+ FV Prox  Full                                                        +---------+---------------+---------+-----------+----------+--------------+  FV Mid   Full           Yes      Yes                                 +---------+---------------+---------+-----------+----------+--------------+ FV DistalFull                                                        +---------+---------------+---------+-----------+----------+--------------+ PFV      Full                                                        +---------+---------------+---------+-----------+----------+--------------+ POP      Full           Yes      Yes                                 +---------+---------------+---------+-----------+----------+--------------+ PTV      Full                                                        +---------+---------------+---------+-----------+----------+--------------+  PERO     Full                                                        +---------+---------------+---------+-----------+----------+--------------+   +----+---------------+---------+-----------+----------+--------------+ LEFTCompressibilityPhasicitySpontaneityPropertiesThrombus Aging +----+---------------+---------+-----------+----------+--------------+ CFV Full           Yes      Yes                                 +----+---------------+---------+-----------+----------+--------------+     Summary: RIGHT: - There is no evidence of deep vein thrombosis in the lower extremity.  - No cystic structure found in the popliteal fossa.  LEFT: - No evidence of common femoral vein obstruction.   *See table(s) above for measurements and observations. Electronically signed by Genny Kid MD on 09/18/2023 at 8:05:32 PM.    Final     Assessment & Plan:   Problem List Items Addressed This Visit     Hypothyroidism   Start Levothyroxine  100 mcg every other day and 50 mcg every other day.      Anxiety disorder   Tranzene - rare use  Potential benefits of a long term benzodiazepines  use as well as potential risks  and complications were explained to the  patient and were aknowledged.      Hormone replacement therapy (HRT) - Primary   See injection adm. Risks/benefits of HRT discussed      Piriformis syndrome   RLE pain Blue-Emu cream was recommended to use 2-3 times a day Exercise, range of motion, heat         Meds ordered this encounter  Medications   estradiol  cypionate (DEPO-ESTRADIOL ) 5 MG/ML injection 1 mg      Follow-up: Return in about 4 weeks (around 11/19/2023) for a follow-up visit.  Anitra Barn, MD

## 2023-10-22 NOTE — Patient Instructions (Signed)
 USEFUL THINGS FOR ARTHRITIS and musculoskeletal pains:    A "rice sock heating pad" refers to a homemade heating pad created by filling a sock with uncooked rice, which can be heated in a microwave to provide a warm compress for sore muscles, pain relief, or other applications; essentially, it's a simple way to generate heat using readily available materials.  Key points about rice sock heat: How to make it: Fill a clean sock (preferably a tube sock) about 2/3 full with uncooked rice, tie a knot at the top to secure the rice inside.  Heating it up: Place the rice sock in the microwave and heat in short intervals (usually around 30 seconds at a time) until it reaches the desired warmth.  Important considerations: Check temperature before applying: Always test the temperature of the rice sock before applying it to your skin to avoid burns.  Use a towel to protect skin: Wrap the rice sock in a thin towel to distribute the heat evenly and protect your skin.  Uses: Muscle aches and pains  Menstrual cramps  Neck pain  Arthritis discomfort

## 2023-10-22 NOTE — Assessment & Plan Note (Addendum)
 RLE pain Blue-Emu cream was recommended to use 2-3 times a day Exercise, range of motion, heat

## 2023-11-03 ENCOUNTER — Encounter: Payer: Self-pay | Admitting: Internal Medicine

## 2023-11-03 NOTE — Assessment & Plan Note (Signed)
Start Levothyroxine 100 mcg every other day and 50 mcg every other day. 

## 2023-11-03 NOTE — Assessment & Plan Note (Signed)
Tranzene - rare use  Potential benefits of a long term benzodiazepines  use as well as potential risks  and complications were explained to the patient and were aknowledged.

## 2023-11-03 NOTE — Assessment & Plan Note (Signed)
 See injection adm. Risks/benefits of HRT discussed

## 2023-11-07 DIAGNOSIS — Z1231 Encounter for screening mammogram for malignant neoplasm of breast: Secondary | ICD-10-CM | POA: Diagnosis not present

## 2023-11-07 LAB — HM MAMMOGRAPHY

## 2023-11-13 ENCOUNTER — Telehealth: Payer: Self-pay | Admitting: Internal Medicine

## 2023-11-13 NOTE — Telephone Encounter (Signed)
 Form has been received and given to PCP to sign.  Facility is aware of the above as well.

## 2023-11-13 NOTE — Telephone Encounter (Signed)
 Copied from CRM [id number]. Topic: Clinical - Request for Lab/Test Order >> Nov 13, 2023  8:35 AM Viola FALCON wrote: Reason for CRM: [facility] requesting call back when order request fro mammogram has been received via faxed. They faxed 3 times yesterday and once this morning. I advised her to fax again. Please call [phone].  ---  Form Received and handed off to [facility]. TDW

## 2023-11-14 DIAGNOSIS — N6322 Unspecified lump in the left breast, upper inner quadrant: Secondary | ICD-10-CM | POA: Diagnosis not present

## 2023-11-14 DIAGNOSIS — R92332 Mammographic heterogeneous density, left breast: Secondary | ICD-10-CM | POA: Diagnosis not present

## 2023-11-14 DIAGNOSIS — N6489 Other specified disorders of breast: Secondary | ICD-10-CM | POA: Diagnosis not present

## 2023-11-15 ENCOUNTER — Encounter: Payer: Self-pay | Admitting: Internal Medicine

## 2023-11-19 ENCOUNTER — Ambulatory Visit (INDEPENDENT_AMBULATORY_CARE_PROVIDER_SITE_OTHER): Admitting: Internal Medicine

## 2023-11-19 ENCOUNTER — Encounter: Payer: Self-pay | Admitting: Internal Medicine

## 2023-11-19 VITALS — BP 118/68 | HR 68 | Temp 98.6°F | Ht 66.0 in | Wt 158.0 lb

## 2023-11-19 DIAGNOSIS — Z79890 Hormone replacement therapy (postmenopausal): Secondary | ICD-10-CM

## 2023-11-19 DIAGNOSIS — F419 Anxiety disorder, unspecified: Secondary | ICD-10-CM | POA: Diagnosis not present

## 2023-11-19 DIAGNOSIS — I10 Essential (primary) hypertension: Secondary | ICD-10-CM | POA: Diagnosis not present

## 2023-11-19 DIAGNOSIS — N63 Unspecified lump in unspecified breast: Secondary | ICD-10-CM | POA: Diagnosis not present

## 2023-11-19 DIAGNOSIS — N951 Menopausal and female climacteric states: Secondary | ICD-10-CM

## 2023-11-19 DIAGNOSIS — E034 Atrophy of thyroid (acquired): Secondary | ICD-10-CM

## 2023-11-19 NOTE — Assessment & Plan Note (Signed)
Start Levothyroxine 100 mcg every other day and 50 mcg every other day. 

## 2023-11-19 NOTE — Assessment & Plan Note (Addendum)
 New L breast nodule 0.4x0.4x0.7 cm -  US  guided bx is planned for tomorrow HRT - will put it on hold

## 2023-11-19 NOTE — Progress Notes (Signed)
 Subjective:  Patient ID: [name], female    DOB: 04-29-1946  Age: 78 y.o. MRN: [id number]  CC: Medical Management of Chronic Issues (4 week f/u)   HPI [name] presents for anxiety, L breast mass, hot flashes  Outpatient Medications Prior to Visit  Medication Sig Dispense Refill   ampicillin  (PRINCIPEN) 500 MG capsule Take 1 capsule (500 mg total) by mouth 4 (four) times daily. 28 capsule 1   aspirin  EC 81 MG tablet Take 1 tablet (81 mg total) by mouth 2 (two) times daily.     Cholecalciferol 1000 UNITS tablet Take 1,000 Units by mouth daily.     clobetasol  ointment (TEMOVATE ) 0.05 % Apply 1 application. topically 2 (two) times daily as needed. 30 g 3   clorazepate  (TRANXENE ) 15 MG tablet TAKE 1 TABLET BY MOUTH THREE TIMES DAILY AS NEEDED FOR ANXIETY. DO  NOT  TAKE  WITH  LORAZEPAM . 270 tablet 1   diphenoxylate -atropine  (LOMOTIL ) 2.5-0.025 MG tablet Take 1 tablet by mouth 4 (four) times daily as needed for diarrhea or loose stools. 40 tablet 1   doxepin  (SINEQUAN ) 50 MG capsule Take 3 capsules (150 mg total) by mouth at bedtime. 90 capsule 3   estradiol  (ESTRACE ) 0.1 MG/GM vaginal cream Place 1 Applicatorful vaginally as needed (irritation). 42.5 g 3   estradiol  cypionate (DEPO-ESTRADIOL ) 5 MG/ML injection Use 0.3 ml every 18-25 days IM 5 mL 3   furosemide  (LASIX ) 40 MG tablet Take 1 tablet (40 mg total) by mouth 2 (two) times daily. 180 tablet 3   levothyroxine  (SYNTHROID ) 100 MCG tablet Start Levothyroxine  100 mcg every other day and 50 mcg every other day. 90 tablet 3   LORazepam  (ATIVAN ) 0.5 MG tablet Take 1 tablet (0.5 mg total) by mouth at bedtime. 90 tablet 1   methylPREDNISolone  (MEDROL  DOSEPAK) 4 MG TBPK tablet As directed 21 tablet 0   metoprolol  tartrate (LOPRESSOR ) 50 MG tablet Take 1.5 tablets (75 mg total) by mouth 3 (three) times daily. 405 tablet 3   NIFEdipine  (PROCARDIA  XL) 60 MG 24 hr tablet Take 1 tablet (60 mg total) by mouth daily. 90 tablet 3    nitrofurantoin , macrocrystal-monohydrate, (MACROBID ) 100 MG capsule Take 1 capsule (100 mg total) by mouth 2 (two) times daily. 10 capsule 0   nitroGLYCERIN  (NITROLINGUAL ) 0.4 MG/SPRAY spray PLACE 1 SPRAY UNDER THE TONGUE EVERY 5 MINUTES AS NEEDED FOR CHEST PAIN. MAY REPEAT 3 TIMES 4.9 g 6   potassium chloride  SA (KLOR-CON  M) 20 MEQ tablet Take 1 tablet (20 mEq total) by mouth daily. 90 tablet 3   triamcinolone  ointment (KENALOG ) 0.1 % Apply 1 Application topically 4 (four) times daily as needed. On hemorrhoids 80 g 2   No facility-administered medications prior to visit.    ROS: Review of Systems  Constitutional:  Positive for diaphoresis and fatigue. Negative for activity change, appetite change, chills and unexpected weight change.  HENT:  Negative for congestion, mouth sores and sinus pressure.   Eyes:  Negative for visual disturbance.  Respiratory:  Negative for cough and chest tightness.   Gastrointestinal:  Negative for abdominal pain and nausea.  Genitourinary:  Negative for difficulty urinating, frequency and vaginal pain.  Musculoskeletal:  Positive for arthralgias, back pain and gait problem.  Skin:  Negative for pallor, rash and wound.  Neurological:  Negative for dizziness, tremors, weakness, numbness and headaches.  Psychiatric/Behavioral:  Negative for confusion, sleep disturbance and suicidal ideas. The patient is nervous/anxious.     Objective:  BP 118/68   Pulse 68   Temp 98.6 F (37 C) (Oral)   Ht 5' 6 (1.676 m)   Wt 158 lb (71.7 kg)   SpO2 97%   BMI 25.50 kg/m   BP Readings from Last 3 Encounters:  11/19/23 118/68  10/22/23 130/70  09/23/23 (!) 162/78    Wt Readings from Last 3 Encounters:  11/19/23 158 lb (71.7 kg)  10/22/23 154 lb (69.9 kg)  09/23/23 156 lb 12.8 oz (71.1 kg)    Physical Exam Constitutional:      General: She is not in acute distress.    Appearance: Normal appearance. She is well-developed.  HENT:     Head: Normocephalic.      Right Ear: External ear normal.     Left Ear: External ear normal.     Nose: Nose normal.   Eyes:     General:        Right eye: No discharge.        Left eye: No discharge.     Conjunctiva/sclera: Conjunctivae normal.     Pupils: Pupils are equal, round, and reactive to light.   Neck:     Thyroid : No thyromegaly.     Vascular: No JVD.     Trachea: No tracheal deviation.   Cardiovascular:     Rate and Rhythm: Normal rate and regular rhythm.     Heart sounds: Normal heart sounds.  Pulmonary:     Effort: No respiratory distress.     Breath sounds: No stridor. No wheezing.  Abdominal:     General: Bowel sounds are normal. There is no distension.     Palpations: Abdomen is soft. There is no mass.     Tenderness: There is no abdominal tenderness. There is no guarding or rebound.   Musculoskeletal:        General: No tenderness.     Cervical back: Normal range of motion and neck supple. No rigidity.  Lymphadenopathy:     Cervical: No cervical adenopathy.   Skin:    Findings: No erythema or rash.   Neurological:     Cranial Nerves: No cranial nerve deficit.     Motor: No abnormal muscle tone.     Coordination: Coordination normal.     Deep Tendon Reflexes: Reflexes normal.   Psychiatric:        Behavior: Behavior normal.        Thought Content: Thought content normal.        Judgment: Judgment normal.   Trace edema - ankles  Lab Results  Component Value Date   WBC 6.5 02/18/2023   HGB 12.3 02/18/2023   HCT 37.9 02/18/2023   PLT 249.0 02/18/2023   GLUCOSE 94 02/18/2023   CHOL 237 (H) 02/18/2023   TRIG 138.0 02/18/2023   HDL 66.00 02/18/2023   LDLDIRECT 188.2 01/10/2012   LDLCALC 143 (H) 02/18/2023   ALT 9 02/18/2023   AST 13 02/18/2023   NA 140 02/18/2023   K 4.0 02/18/2023   CL 103 02/18/2023   CREATININE 1.39 (H) 02/18/2023   BUN 15 02/18/2023   CO2 29 02/18/2023   TSH 1.22 02/18/2023   INR 1.0 08/01/2018    VAS US  LOWER EXTREMITY VENOUS  (DVT) Result Date: 09/18/2023  Lower Venous DVT Study Patient Name:  [name] [facility]  Date of Exam:   08/30/2023 Medical Rec #: [id number]     Accession #:    [id number] Date of Birth: 19-Apr-1946      Patient  Gender: F Patient Age:   56 years Exam Location:  [facility] Procedure:      VAS US  LOWER EXTREMITY VENOUS (DVT) Referring Phys: LYNWOOD RUSH --------------------------------------------------------------------------------  Indications: Pain, Swelling, and s/p fall x 4 months.  Comparison Study: 04/22/2018 negative lower extremity venous duplex Performing Technologist: Rosaline Fujisawa MHA, RDMS, RVT, RDCS  Examination Guidelines: A complete evaluation includes B-mode imaging, spectral Doppler, color Doppler, and power Doppler as needed of all accessible portions of each vessel. Bilateral testing is considered an integral part of a complete examination. Limited examinations for reoccurring indications may be performed as noted. The reflux portion of the exam is performed with the patient in reverse Trendelenburg.  +---------+---------------+---------+-----------+----------+--------------+ RIGHT    CompressibilityPhasicitySpontaneityPropertiesThrombus Aging +---------+---------------+---------+-----------+----------+--------------+ CFV      Full           Yes      Yes                                 +---------+---------------+---------+-----------+----------+--------------+ SFJ      Full                                                        +---------+---------------+---------+-----------+----------+--------------+ FV Prox  Full                                                        +---------+---------------+---------+-----------+----------+--------------+ FV Mid   Full           Yes      Yes                                 +---------+---------------+---------+-----------+----------+--------------+ FV DistalFull                                                         +---------+---------------+---------+-----------+----------+--------------+ PFV      Full                                                        +---------+---------------+---------+-----------+----------+--------------+ POP      Full           Yes      Yes                                 +---------+---------------+---------+-----------+----------+--------------+ PTV      Full                                                        +---------+---------------+---------+-----------+----------+--------------+  PERO     Full                                                        +---------+---------------+---------+-----------+----------+--------------+   +----+---------------+---------+-----------+----------+--------------+ LEFTCompressibilityPhasicitySpontaneityPropertiesThrombus Aging +----+---------------+---------+-----------+----------+--------------+ CFV Full           Yes      Yes                                 +----+---------------+---------+-----------+----------+--------------+     Summary: RIGHT: - There is no evidence of deep vein thrombosis in the lower extremity.  - No cystic structure found in the popliteal fossa.  LEFT: - No evidence of common femoral vein obstruction.   *See table(s) above for measurements and observations. Electronically signed by Gaile New MD on 09/18/2023 at 8:05:32 PM.    Final     Assessment & Plan:   Problem List Items Addressed This Visit     Hypothyroidism   Start Levothyroxine  100 mcg every other day and 50 mcg every other day.      Anxiety disorder   Tranzene - rare use  Potential benefits of a long term benzodiazepines  use as well as potential risks  and complications were explained to the patient and were aknowledged.      Essential hypertension   Blood pressure seems to be reasonably well-controlled.  Continue Procardia  XL      Hot flash, menopausal   L breast US  guided bx is planned for  tomorrow HRT - will put it on hold      Hormone replacement therapy (HRT)   L breast US  guided bx is planned for tomorrow HRT - will put it on hold       Breast nodule - Primary   New L breast nodule 0.4x0.4x0.7 cm -  US  guided bx is planned for tomorrow HRT - will put it on hold         No orders of the defined types were placed in this encounter.     Follow-up: Return in about 4 weeks (around 12/17/2023) for a follow-up visit.  Marolyn Noel, MD

## 2023-11-19 NOTE — Assessment & Plan Note (Signed)
 L breast US  guided bx is planned for tomorrow HRT - will put it on hold

## 2023-11-19 NOTE — Assessment & Plan Note (Signed)
Blood pressure seems to be reasonably well-controlled.  Continue Procardia XL

## 2023-11-19 NOTE — Assessment & Plan Note (Signed)
Tranzene - rare use  Potential benefits of a long term benzodiazepines  use as well as potential risks  and complications were explained to the patient and were aknowledged.

## 2023-11-20 DIAGNOSIS — N6322 Unspecified lump in the left breast, upper inner quadrant: Secondary | ICD-10-CM | POA: Diagnosis not present

## 2023-11-20 DIAGNOSIS — C50212 Malignant neoplasm of upper-inner quadrant of left female breast: Secondary | ICD-10-CM | POA: Diagnosis not present

## 2023-11-21 ENCOUNTER — Telehealth: Payer: Self-pay | Admitting: *Deleted

## 2023-11-21 NOTE — Telephone Encounter (Signed)
 Spoke to patient to confirm upcoming afternoon [facility] clinic appointment on 7/9, paperwork will be sent via email.  Gave location and time, also informed patient that the surgeon's office would be calling as well to get information from them similar to the packet that they will be receiving so make sure to do both.  Reminded patient that all providers will be coming to the clinic to see them HERE and if they had any questions to not hesitate to reach back out to myself or their navigators.

## 2023-11-25 ENCOUNTER — Encounter: Payer: Self-pay | Admitting: *Deleted

## 2023-11-25 DIAGNOSIS — C50212 Malignant neoplasm of upper-inner quadrant of left female breast: Secondary | ICD-10-CM

## 2023-11-26 DIAGNOSIS — C50212 Malignant neoplasm of upper-inner quadrant of left female breast: Secondary | ICD-10-CM | POA: Insufficient documentation

## 2023-11-26 NOTE — Progress Notes (Signed)
 Radiation Oncology         [phone] ________________________________  Multidisciplinary Breast Oncology Clinic [facility]) Initial Outpatient Consultation  Name: [name] MRN: [id number]  Date: 11/27/2023  DOB: 1945-08-08  RR:Eonuwpxnc, [name] GAILS, MD  Ebbie Cough, MD   REFERRING PHYSICIAN: Ebbie Cough, MD  DIAGNOSIS: There were no encounter diagnoses.  Stage *** Left Breast UIQ, Invasive Ductal Carcinoma, ER*** / PR*** / Her2***, Grade 1  No diagnosis found.  HISTORY OF PRESENT ILLNESS::[name] is a 78 y.o. female who is presenting to the office today for evaluation of her newly diagnosed breast cancer. She is accompanied by ***. She is doing well overall.   She had routine screening mammography on 11/07/23 showing a possible abnormality in the left breast. She underwent a left breast diagnostic mammogram at [facility] on 11/14/23 which did not clearly demonstrate the previously demonstrated asymmetry in the central left breast. A left breast ultrasound was subsequently performed that same date which revealed a 0.7 cm mass in the 11 o'clock left breast at a middle depth, located 5 cmfn, correlating with screening mammography findings. No evidence of left axillary lymphadenopathy was demonstrated.   Biopsy of the 11 o'clock left breast mass on 11/20/23 showed: grade 1 invasive ductal carcinoma measuring 4 mm in the greatest linear extent of the sample (negative for LVI). Prognostic indicators significant for: estrogen receptor, ***% {positive or negative} and progesterone receptor, ***% {positive or negative}, both with {include note beside percentage here/strong staining intensity}. Proliferation marker Ki67 at ***%. HER2 {positive or negative}.  Menarche: *** years old Age at first live birth: *** years old GP: *** LMP: menopausal  Contraceptive: *** HRT: yes (for hot flashes); recently put on hold by her PCP on 07/01 due to [facility] diagnosis   The patient was referred  today for presentation in the multidisciplinary conference.  Radiology studies and pathology slides were presented there for review and discussion of treatment options.  A consensus was discussed regarding potential next steps.  PREVIOUS RADIATION THERAPY: No  PAST MEDICAL HISTORY:  Past Medical History:  Diagnosis Date   Anxiety state, unspecified    Bronchitis, acute    Carotid bruit    Coronary atherosclerosis of unspecified type of vessel, native or graft    multiple angioplasy procedures in 90's/early 2000's   De Quervain's tenosynovitis    Depressive disorder, not elsewhere classified    Female bladder prolapse    Lichen 2011   Vaginal   Other and unspecified hyperlipidemia    Unspecified essential hypertension    Unspecified hypothyroidism     PAST SURGICAL HISTORY: Past Surgical History:  Procedure Laterality Date   MOUTH SURGERY     NOSE SURGERY  1999   TOTAL VAGINAL HYSTERECTOMY  1994    FAMILY HISTORY:  Family History  Problem Relation Age of Onset   Hypertension Mother    Hypertension Father    Hypertension Other    Coronary artery disease Other     SOCIAL HISTORY:  Social History   Socioeconomic History   Marital status: Widowed    Spouse name: Not on file   Number of children: Not on file   Years of education: Not on file   Highest education level: Not on file  Occupational History   Not on file  Tobacco Use   Smoking status: Former    Current packs/day: 0.00    Types: Cigarettes    Quit date: 07/22/1991    Years since quitting: 32.3  Smokeless tobacco: Never  Vaping Use   Vaping status: Never Used  Substance and Sexual Activity   Alcohol use: No   Drug use: No   Sexual activity: Not Currently    Birth control/protection: Surgical    Comment: partial hysterectomy  Other Topics Concern   Not on file  Social History Narrative   Regular Exercise -  YES, silver Clinical cytogeneticist, 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   Social Drivers of Manufacturing engineer Strain: Not on file  Food Insecurity: Not on file  Transportation Needs: Not on file  Physical Activity: Not on file  Stress: Not on file  Social Connections: Not on file    ALLERGIES:  Allergies  Allergen Reactions   Epinephrine  Other (See Comments)    unknown   Isosorbide Mononitrate Other (See Comments)    Severe headache   Lidocaine  Nausea And Vomiting   Morphine Other (See Comments)    unknown   Propoxyphene N-Acetaminophen Other (See Comments)    unknown   Alprazolam Other (See Comments)    REACTION: very sedating   Atorvastatin Other (See Comments)    REACTION: weakness   Codeine Nausea And Vomiting   Ezetimibe Other (See Comments)    REACTION: myalgia   Prilosec [Omeprazole ] Diarrhea and Other (See Comments)    Chest discomfort   Rosuvastatin Other (See Comments)    REACTION: leg weakness   Simvastatin Other (See Comments)    REACTION: leg  weakness   Allopurinol      ?bruising per pt   Clonidine  Derivatives     whoozy   Meloxicam      diarrhea   Metronidazole      Abd cramps   Tetracycline Nausea And Vomiting   Triple Antibiotic [Bacitracin-Neomycin-Polymyxin]     rash   Amoxicillin  Nausea And Vomiting    Can take Ampicillin    Ciprofloxacin Nausea And Vomiting   Diphenhydramine Hcl Anxiety    MEDICATIONS:  Current Outpatient Medications  Medication Sig Dispense Refill   ampicillin  (PRINCIPEN) 500 MG capsule Take 1 capsule (500 mg total) by mouth 4 (four) times daily. 28 capsule 1   aspirin  EC 81 MG tablet Take 1 tablet (81 mg total) by mouth 2 (two) times daily.     Cholecalciferol 1000 UNITS tablet Take 1,000 Units by mouth daily.     clobetasol  ointment (TEMOVATE ) 0.05 % Apply 1 application. topically 2 (two) times daily as needed. 30 g 3   clorazepate  (TRANXENE ) 15 MG tablet TAKE 1 TABLET BY MOUTH THREE TIMES DAILY AS NEEDED FOR ANXIETY. DO  NOT  TAKE  WITH  LORAZEPAM . 270 tablet 1   diphenoxylate -atropine  (LOMOTIL ) 2.5-0.025  MG tablet Take 1 tablet by mouth 4 (four) times daily as needed for diarrhea or loose stools. 40 tablet 1   doxepin  (SINEQUAN ) 50 MG capsule Take 3 capsules (150 mg total) by mouth at bedtime. 90 capsule 3   estradiol  (ESTRACE ) 0.1 MG/GM vaginal cream Place 1 Applicatorful vaginally as needed (irritation). 42.5 g 3   estradiol  cypionate (DEPO-ESTRADIOL ) 5 MG/ML injection Use 0.3 ml every 18-25 days IM 5 mL 3   furosemide  (LASIX ) 40 MG tablet Take 1 tablet (40 mg total) by mouth 2 (two) times daily. 180 tablet 3   levothyroxine  (SYNTHROID ) 100 MCG tablet Start Levothyroxine  100 mcg every other day and 50 mcg every other day. 90 tablet 3   LORazepam  (ATIVAN ) 0.5 MG tablet Take 1 tablet (0.5 mg total) by mouth at bedtime. 90 tablet 1  methylPREDNISolone  (MEDROL  DOSEPAK) 4 MG TBPK tablet As directed 21 tablet 0   metoprolol  tartrate (LOPRESSOR ) 50 MG tablet Take 1.5 tablets (75 mg total) by mouth 3 (three) times daily. 405 tablet 3   NIFEdipine  (PROCARDIA  XL) 60 MG 24 hr tablet Take 1 tablet (60 mg total) by mouth daily. 90 tablet 3   nitrofurantoin , macrocrystal-monohydrate, (MACROBID ) 100 MG capsule Take 1 capsule (100 mg total) by mouth 2 (two) times daily. 10 capsule 0   nitroGLYCERIN  (NITROLINGUAL ) 0.4 MG/SPRAY spray PLACE 1 SPRAY UNDER THE TONGUE EVERY 5 MINUTES AS NEEDED FOR CHEST PAIN. MAY REPEAT 3 TIMES 4.9 g 6   potassium chloride  SA (KLOR-CON  M) 20 MEQ tablet Take 1 tablet (20 mEq total) by mouth daily. 90 tablet 3   triamcinolone  ointment (KENALOG ) 0.1 % Apply 1 Application topically 4 (four) times daily as needed. On hemorrhoids 80 g 2   No current facility-administered medications for this encounter.    REVIEW OF SYSTEMS: A 10+ POINT REVIEW OF SYSTEMS WAS OBTAINED including neurology, dermatology, psychiatry, cardiac, respiratory, lymph, extremities, GI, GU, musculoskeletal, constitutional, reproductive, HEENT. On the provided form, she reports ***. She denies *** and any other symptoms.     PHYSICAL EXAM:  vitals were not taken for this visit.  {may need to copy over vitals} Lungs are clear to auscultation bilaterally. Heart has regular rate and rhythm. No palpable cervical, supraclavicular, or axillary adenopathy. Abdomen soft, non-tender, normal bowel sounds. Breast: Right breast with no palpable mass, nipple discharge, or bleeding. Left breast with ***.   KPS = ***  100 - Normal; no complaints; no evidence of disease. 90   - Able to carry on normal activity; minor signs or symptoms of disease. 80   - Normal activity with effort; some signs or symptoms of disease. 33   - Cares for self; unable to carry on normal activity or to do active work. 60   - Requires occasional assistance, but is able to care for most of his personal needs. 50   - Requires considerable assistance and frequent medical care. 40   - Disabled; requires special care and assistance. 30   - Severely disabled; hospital admission is indicated although death not imminent. 20   - Very sick; hospital admission necessary; active supportive treatment necessary. 10   - Moribund; fatal processes progressing rapidly. 0     - Dead  Karnofsky DA, Abelmann WH, Craver LS and Burchenal [facility] [phone]) The use of the nitrogen mustards in the palliative treatment of carcinoma: with particular reference to bronchogenic carcinoma Cancer 1 634-56  LABORATORY DATA:  Lab Results  Component Value Date   WBC 6.5 02/18/2023   HGB 12.3 02/18/2023   HCT 37.9 02/18/2023   MCV 95.3 02/18/2023   PLT 249.0 02/18/2023   Lab Results  Component Value Date   NA 140 02/18/2023   K 4.0 02/18/2023   CL 103 02/18/2023   CO2 29 02/18/2023   Lab Results  Component Value Date   ALT 9 02/18/2023   AST 13 02/18/2023   ALKPHOS 78 02/18/2023   BILITOT 0.6 02/18/2023    PULMONARY FUNCTION TEST:   Review Flowsheet        No data to display          RADIOGRAPHY: No results found.    IMPRESSION: *** {DIAGNOSIS HERE}  Patient  will be a good candidate for breast conservation with radiotherapy to the left breast. We discussed the general course of radiation, potential side effects,  and toxicities with radiation and the patient is interested in this approach. ***   PLAN:  *** (copy notes from board at conference)   ------------------------------------------------  Lynwood CHARM Nasuti, PhD, MD  This document serves as a record of services personally performed by Lynwood Nasuti, MD. It was created on his behalf by Dorthy Fuse, a trained medical scribe. The creation of this record is based on the scribe's personal observations and the provider's statements to them. This document has been checked and approved by the attending provider.

## 2023-11-27 ENCOUNTER — Inpatient Hospital Stay

## 2023-11-27 ENCOUNTER — Ambulatory Visit
Admission: RE | Admit: 2023-11-27 | Discharge: 2023-11-27 | Disposition: A | Discharge status: Home / Self Care | Source: Ambulatory Visit | Attending: Radiation Oncology | Admitting: Radiation Oncology

## 2023-11-27 ENCOUNTER — Telehealth: Payer: Self-pay | Admitting: Genetic Counselor

## 2023-11-27 ENCOUNTER — Encounter: Payer: Self-pay | Admitting: Hematology

## 2023-11-27 ENCOUNTER — Encounter: Payer: Self-pay | Admitting: General Practice

## 2023-11-27 ENCOUNTER — Other Ambulatory Visit: Payer: Self-pay

## 2023-11-27 ENCOUNTER — Encounter: Payer: Self-pay | Admitting: *Deleted

## 2023-11-27 ENCOUNTER — Ambulatory Visit: Admitting: Physical Therapy

## 2023-11-27 ENCOUNTER — Inpatient Hospital Stay: Attending: Hematology | Admitting: Hematology

## 2023-11-27 VITALS — BP 195/74 | HR 61 | Temp 98.4°F | Resp 15 | Ht 66.0 in | Wt 157.7 lb

## 2023-11-27 DIAGNOSIS — E2839 Other primary ovarian failure: Secondary | ICD-10-CM

## 2023-11-27 DIAGNOSIS — C50212 Malignant neoplasm of upper-inner quadrant of left female breast: Secondary | ICD-10-CM

## 2023-11-27 DIAGNOSIS — Z170 Estrogen receptor positive status [ER+]: Secondary | ICD-10-CM | POA: Insufficient documentation

## 2023-11-27 DIAGNOSIS — Z1721 Progesterone receptor positive status: Secondary | ICD-10-CM | POA: Insufficient documentation

## 2023-11-27 DIAGNOSIS — Z1732 Human epidermal growth factor receptor 2 negative status: Secondary | ICD-10-CM | POA: Diagnosis not present

## 2023-11-27 LAB — CMP (CANCER CENTER ONLY)
ALT: 11 U/L (ref 0–44)
AST: 19 U/L (ref 15–41)
Albumin: 3.9 g/dL (ref 3.5–5.0)
Alkaline Phosphatase: 76 U/L (ref 38–126)
Anion gap: 9 (ref 5–15)
BUN: 17 mg/dL (ref 8–23)
CO2: 27 mmol/L (ref 22–32)
Calcium: 9.1 mg/dL (ref 8.9–10.3)
Chloride: 103 mmol/L (ref 98–111)
Creatinine: 1.46 mg/dL — ABNORMAL HIGH (ref 0.44–1.00)
GFR, Estimated: 36 mL/min — ABNORMAL LOW (ref >60)
Glucose, Bld: 169 mg/dL — ABNORMAL HIGH (ref 70–99)
Potassium: 4.1 mmol/L (ref 3.5–5.1)
Sodium: 139 mmol/L (ref 135–145)
Total Bilirubin: 0.4 mg/dL (ref 0.0–1.2)
Total Protein: 6.8 g/dL (ref 6.5–8.1)

## 2023-11-27 LAB — CBC WITH DIFFERENTIAL (CANCER CENTER ONLY)
Abs Immature Granulocytes: 0.01 K/uL (ref 0.00–0.07)
Basophils Absolute: 0.1 K/uL (ref 0.0–0.1)
Basophils Relative: 1 %
Eosinophils Absolute: 0.1 K/uL (ref 0.0–0.5)
Eosinophils Relative: 2 %
HCT: 37.4 % (ref 36.0–46.0)
Hemoglobin: 12.6 g/dL (ref 12.0–15.0)
Immature Granulocytes: 0 %
Lymphocytes Relative: 23 %
Lymphs Abs: 1.4 K/uL (ref 0.7–4.0)
MCH: 32 pg (ref 26.0–34.0)
MCHC: 33.7 g/dL (ref 30.0–36.0)
MCV: 94.9 fL (ref 80.0–100.0)
Monocytes Absolute: 0.3 K/uL (ref 0.1–1.0)
Monocytes Relative: 5 %
Neutro Abs: 4.1 K/uL (ref 1.7–7.7)
Neutrophils Relative %: 69 %
Platelet Count: 258 K/uL (ref 150–400)
RBC: 3.94 MIL/uL (ref 3.87–5.11)
RDW: 12 % (ref 11.5–15.5)
WBC Count: 5.9 K/uL (ref 4.0–10.5)
nRBC: 0 % (ref 0.0–0.2)

## 2023-11-27 LAB — GENETIC SCREENING ORDER

## 2023-11-27 NOTE — Progress Notes (Signed)
 REFERRING PHYSICIAN:  Lanny Callander, MD PROVIDER:  DONNICE CARLIN BURY, MD MRN: [id number] DOB: 02/08/1946 DATE OF ENCOUNTER: 11/27/2023 Subjective    Chief Complaint: Breast Cancer   History of Present Illness: 3 yof with screening mm that shows left upper medial breast an asymmetry. She has c density breast tissue.  On US  there is a 4x4x7 mm mass.  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  She lives alone.  She is retired.  She is listed as allergic to lidocaine  in some paperwork but this is not the case    Review of Systems: A complete review of systems was obtained from the patient.  I have reviewed this information and discussed as appropriate with the patient.  See HPI as well for other ROS.  Review of Systems  All other systems reviewed and are negative.    Medical History: Past Medical History:  Diagnosis Date  . Anxiety   . GERD (gastroesophageal reflux disease)   . History of cancer   . Thyroid  disease     There is no problem list on file for this patient.   Past Surgical History:  Procedure Laterality Date  . HYSTERECTOMY    . Nose Surgery N/A    1999     Allergies  Allergen Reactions  . Isosorbide Mononitrate Other (See Comments)    Severe headache  . Lidocaine  Nausea And Vomiting  . Propoxyphene N-Acetaminophen Other (See Comments)    unknown  . Atorvastatin Other (See Comments)    REACTION: weakness  . Codeine Nausea And Vomiting  . Omeprazole  Diarrhea and Other (See Comments)    Chest discomfort  . Rosuvastatin Other (See Comments)    REACTION: leg weakness  . Simvastatin Other (See Comments)    REACTION: leg  weakness  . Neomycin-Bacitracin-Polymyxin Rash    rash  . Tetracycline Nausea And Vomiting  . Amoxicillin  Nausea And Vomiting    Can take Ampicillin     Current Outpatient Medications on File Prior to Visit  Medication Sig Dispense Refill  . aspirin  81 MG EC tablet  Take 81 mg by mouth 2 (two) times daily    . FUROsemide  (LASIX ) 40 MG tablet Take 40 mg by mouth 2 (two) times daily    . levothyroxine  (SYNTHROID ) 100 MCG tablet Take 100 mcg by mouth every other day With 50 mcg every other day    . metoprolol  TARTrate (LOPRESSOR ) 50 MG tablet Take 75 mg by mouth 3 (three) times daily    . NIFEdipine  (PROCARDIA -XL) 60 MG (OSM) XL tablet Take 60 mg by mouth once daily    . potassium chloride  (KLOR-CON  M20) 20 MEQ ER tablet Take 20 mEq by mouth once daily    . cholecalciferol (VITAMIN D3) 1000 unit tablet Take 1,000 Units by mouth once daily     No current facility-administered medications on file prior to visit.    Family History  Problem Relation Age of Onset  . High blood pressure (Hypertension) Mother   . High blood pressure (Hypertension) Father      Social History   Tobacco Use  Smoking Status Former  . Types: Cigarettes  Smokeless Tobacco Never  Tobacco Comments   Quit smoking cigarettes on 07/22/1991     Social History   Socioeconomic History  . Marital status: Married  Tobacco Use  . Smoking status: Former    Types: Cigarettes  . Smokeless tobacco: Never  .  Tobacco comments:    Quit smoking cigarettes on 07/22/1991  Vaping Use  . Vaping status: Never Used  Substance and Sexual Activity  . Drug use: Never    Objective:  There were no vitals filed for this visit.  There is no height or weight on file to calculate BMI.  Physical Exam Vitals reviewed.  Constitutional:      Appearance: Normal appearance.  Chest:  Breasts:    Right: No inverted nipple, mass or nipple discharge.     Left: No inverted nipple, mass or nipple discharge.  Lymphadenopathy:     Upper Body:     Right upper body: No supraclavicular or axillary adenopathy.     Left upper body: No supraclavicular or axillary adenopathy.   Neurological:     Mental Status: She is alert.        Assessment and Plan:     Left breast cancer, clinical stage  I Left breast magseed guided lumpectomy  We discussed the staging and pathophysiology of breast cancer. We discussed all of the different options for treatment for breast cancer including surgery, radiation therapy, and antiestrogen therapy.  We discussed omitting a sn biopsy due to age and tumor biology based on guidelines  We discussed the options for treatment of the breast cancer which included lumpectomy versus a mastectomy. We discussed the performance of the lumpectomy with radioactive seed placement. We discussed a 5-10% chance of a positive margin requiring reexcision in the operating room. We also discussed that she will likely need radiation therapy if she undergoes lumpectomy.  We discussed mastectomy and the postoperative care for that as well. Mastectomy can be followed by reconstruction. The decision for lumpectomy vs mastectomy has no impact on decision for chemotherapy. Most mastectomy patients will not need radiation therapy. We discussed that there is no difference in her survival whether she undergoes lumpectomy with radiation therapy or antiestrogen therapy versus a mastectomy. There is also no real difference between her recurrence in the breast.  We discussed the risks of operation including bleeding, infection, possible reoperation. She understands her further therapy will be based on what her stages at the time of her operation.     MATTHEW CARLIN BURY, MD

## 2023-11-27 NOTE — Research (Signed)
 [employer] 2021-05 - Specimen Collection Study to Evaluate Biomarkers in Subjects with Cancer    Patient [name] was identified by Dr. Lanny as a potential candidate for the above listed study.  This Clinical Research Coordinator met with [name], [id number], on 11/27/23 in a manner and location that ensures patient privacy to discuss participation in the above listed research study.  Patient is Unaccompanied.  A copy of the informed consent document with embedded HIPAA language was provided to the patient.  Patient reads, speaks, and understands [place].   Patient was provided with the business card of this Coordinator and encouraged to contact the research team with any questions.  Approximately 10 minutes were spent with the patient reviewing the informed consent documents.  Patient was provided the option of taking informed consent documents home to review and was encouraged to review at their convenience with their support network, including other care providers. Patient took the consent documents home to review.   Pt. was thanked for her time and she was encouraged to call research team with any questions/concerns. Research team will follow up with her at 2 pm on Monday, 12/02/23.  Suzzanne Brunkhorst, Ph.D. Clinical Research Coordinator [phone] 11/27/2023 5:18 PM

## 2023-11-27 NOTE — Telephone Encounter (Signed)
 [name] was seen by a genetic counselor during the breast multidisciplinary clinic on 11/27/2023. In addition to her personal history of breast cancer, she reported a family history of a sister diagnosed with breast cancer at an unknown age. She does not meet NCCN criteria for genetic testing at this time. She was still offered genetic counseling and testing but declined. We encourage her to contact us  if there are any changes to her personal or family history of cancer. If she meets NCCN criteria based on the updated personal/family history, she would be recommended to have genetic counseling and testing.   Chancey Ringel, MS, [facility] Genetic Counselor [email or web address] (P) [phone]

## 2023-11-27 NOTE — Progress Notes (Signed)
 [facility]   Telephone:(336) [phone] Fax:([phone]) [phone]   Clinic New Consult Note   Patient Care Team: Plotnikov, Karlynn GAILS, MD as PCP - General (Internal Medicine) Wonda Sharper, MD as PCP - Cardiology (Cardiology) Jannis Kate Norris, MD as Consulting Physician (Obstetrics and Gynecology) Renda Glance, MD as Consulting Physician (Urology) Russell, Vina BRAVO, MD as Consulting Physician (Ophthalmology) Wonda Sharper, MD as Consulting Physician (Cardiology) Tyree Nanetta SAILOR, RN as Oncology Nurse Navigator Gerome, Devere HERO, RN as Oncology Nurse Navigator Ebbie Cough, MD as Consulting Physician (General Surgery) Lanny Callander, MD as Consulting Physician (Hematology) Shannon Agent, MD as Consulting Physician (Radiation Oncology) 11/27/2023  CHIEF COMPLAINTS/PURPOSE OF CONSULTATION:  Newly diagnosed left breast cancer  REFERRING PHYSICIAN: [facility]  History of Present Illness Patient is a 78 year old Caucasian female with past medical history of anxiety, depression, bladder prolapse, hypertension, hypothyroidism, who presents to our multidisciplinary breast clinic today for her newly diagnosed left breast cancer.  She presents to clinic alone today.  This was discovered on the screening mammogram in November 07 2023, which showed asymmetry in the upper medial quadrant of left breast and was indeterminate.  She denies any palpable breast mass or pain in the breast.  She subsequently underwent diagnostic mammogram and ultrasound of left breast, which showed a 0.4 x 0.4 x 0.7 cm irregular mass in the left breast 11 o'clock position, 5 cm from nipple.  Ultrasound of the left axilla was negative.  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  She lives alone, with very limited social support.  She does have a daughter who had attempted multiple suicide in the past and she is not in contact  with her anymore.  She lives in her apartment on first floor, with her dog.  She is able to live independently.  Does not go to church anymore due to the COVID pandemic.  She does see her primary care physician Dr. Garald every month.  She is very talkative.     MEDICAL HISTORY:  Past Medical History:  Diagnosis Date   Anxiety state, unspecified    Bronchitis, acute    Carotid bruit    Coronary atherosclerosis of unspecified type of vessel, native or graft    multiple angioplasy procedures in 90's/early 2000's   De Quervain's tenosynovitis    Depressive disorder, not elsewhere classified    Female bladder prolapse    Lichen 2011   Vaginal   Other and unspecified hyperlipidemia    Unspecified essential hypertension    Unspecified hypothyroidism     SURGICAL HISTORY: Past Surgical History:  Procedure Laterality Date   MOUTH SURGERY     NOSE SURGERY  1999   TOTAL VAGINAL HYSTERECTOMY  1994    SOCIAL HISTORY: Social History   Socioeconomic History   Marital status: Widowed    Spouse name: Not on file   Number of children: 3   Years of education: Not on file   Highest education level: Not on file  Occupational History   Not on file  Tobacco Use   Smoking status: Former    Current packs/day: 0.00    Types: Cigarettes    Quit date: 07/22/1991    Years since quitting: 32.3   Smokeless tobacco: Never  Vaping Use   Vaping status: Never Used  Substance and Sexual Activity   Alcohol use: No   Drug use: No   Sexual activity: Not Currently  Birth control/protection: Surgical    Comment: partial hysterectomy  Other Topics Concern   Not on file  Social History Narrative   Regular Exercise -  YES, silver Clinical cytogeneticist, 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 at unknown age   Hypertension Other    Coronary artery disease Other     ALLERGIES:  is allergic to epinephrine , isosorbide mononitrate, lidocaine , morphine, propoxyphene n-acetaminophen, alprazolam, atorvastatin, codeine, ezetimibe, prilosec [omeprazole ], rosuvastatin, simvastatin, allopurinol , clonidine  derivatives, meloxicam , metronidazole , tetracycline, triple antibiotic [bacitracin-neomycin-polymyxin], amoxicillin , ciprofloxacin, and diphenhydramine hcl.  MEDICATIONS:  Current Outpatient Medications  Medication Sig Dispense Refill   ampicillin  (PRINCIPEN) 500 MG capsule Take 1 capsule (500 mg total) by mouth 4 (four) times daily. 28 capsule 1   aspirin  EC 81 MG tablet Take 1 tablet (81 mg total) by mouth 2 (two) times daily.     Cholecalciferol 1000 UNITS tablet Take 1,000 Units by mouth daily.     clobetasol  ointment (TEMOVATE ) 0.05 % Apply 1 application. topically 2 (two) times daily as needed. 30 g 3   clorazepate  (TRANXENE ) 15 MG tablet TAKE 1 TABLET BY MOUTH THREE TIMES DAILY AS NEEDED FOR ANXIETY. DO  NOT  TAKE  WITH  LORAZEPAM . 270 tablet 1   diphenoxylate -atropine  (LOMOTIL ) 2.5-0.025 MG tablet Take 1 tablet by mouth 4 (four) times daily as needed for diarrhea or loose stools. 40 tablet 1   doxepin  (SINEQUAN ) 50 MG capsule Take 3 capsules (150 mg total) by mouth at bedtime. 90 capsule 3   estradiol  (ESTRACE ) 0.1 MG/GM vaginal cream Place 1 Applicatorful vaginally as needed (irritation). 42.5 g 3   estradiol  cypionate (DEPO-ESTRADIOL ) 5 MG/ML injection Use 0.3 ml every 18-25 days IM 5 mL 3   furosemide  (LASIX ) 40 MG tablet Take 1 tablet (40 mg total) by mouth 2 (two) times daily. 180 tablet 3   levothyroxine  (SYNTHROID ) 100 MCG tablet Start Levothyroxine  100 mcg every other day and 50 mcg every  other day. 90 tablet 3   LORazepam  (ATIVAN ) 0.5 MG tablet Take 1 tablet (0.5 mg total) by mouth at bedtime. 90 tablet 1   methylPREDNISolone  (MEDROL  DOSEPAK) 4 MG TBPK tablet As directed 21 tablet 0   metoprolol  tartrate (LOPRESSOR ) 50 MG tablet Take 1.5 tablets (75 mg total) by mouth 3 (three) times daily. 405 tablet 3   NIFEdipine  (PROCARDIA  XL) 60 MG 24 hr tablet Take 1 tablet (60 mg total) by mouth daily. 90 tablet 3   nitrofurantoin , macrocrystal-monohydrate, (MACROBID ) 100 MG capsule Take 1 capsule (100 mg total) by mouth 2 (two) times daily. 10 capsule 0   nitroGLYCERIN  (NITROLINGUAL ) 0.4 MG/SPRAY spray PLACE 1 SPRAY UNDER THE TONGUE EVERY 5 MINUTES AS NEEDED FOR CHEST PAIN. MAY REPEAT 3 TIMES 4.9 g 6   potassium chloride  SA (KLOR-CON  M) 20 MEQ tablet Take 1 tablet (20 mEq total) by mouth daily. 90 tablet 3   triamcinolone  ointment (KENALOG ) 0.1 % Apply 1 Application topically 4 (four) times daily as needed. On hemorrhoids 80 g 2  No current facility-administered medications for this visit.    REVIEW OF SYSTEMS:   Constitutional: Denies fevers, chills or abnormal night sweats Eyes: Denies blurriness of vision, double vision or watery eyes Ears, nose, mouth, throat, and face: Denies mucositis or sore throat Respiratory: Denies cough, dyspnea or wheezes Cardiovascular: Denies palpitation, chest discomfort or lower extremity swelling Gastrointestinal:  Denies nausea, heartburn or change in bowel habits Skin: Denies abnormal skin rashes Lymphatics: Denies new lymphadenopathy or easy bruising Neurological:Denies numbness, tingling or new weaknesses Behavioral/Psych: Mood is stable, no new changes  All other systems were reviewed with the patient and are negative.  PHYSICAL EXAMINATION: ECOG PERFORMANCE STATUS: 1 - Symptomatic but completely ambulatory  Vitals:   11/27/23 1332  BP: (!) 195/74  Pulse: 61  Resp: 15  Temp: 98.4 F (36.9 C)  SpO2: 99%   Filed Weights   11/27/23  1332  Weight: 71.5 kg (157 lb 11.2 oz)    GENERAL:alert, no distress and comfortable SKIN: skin color, texture, turgor are normal, no rashes or significant lesions EYES: normal, conjunctiva are pink and non-injected, sclera clear OROPHARYNX:no exudate, no erythema and lips, buccal mucosa, and tongue normal  NECK: supple, thyroid  normal size, non-tender, without nodularity LYMPH:  no palpable lymphadenopathy in the cervical, axillary or inguinal LUNGS: clear to auscultation and percussion with normal breathing effort HEART: regular rate & rhythm and no murmurs and no lower extremity edema ABDOMEN:abdomen soft, non-tender and normal bowel sounds Musculoskeletal:no cyanosis of digits and no clubbing  PSYCH: alert & oriented x 3 with fluent speech NEURO: no focal motor/sensory deficits Breasts: Breast inspection showed them to be symmetrical with no nipple discharge. Palpation of the breasts and axilla revealed no obvious mass that I could appreciate.   Physical Exam   LABORATORY DATA:  I have reviewed the data as listed    Latest Ref Rng & Units 11/27/2023   12:23 PM 02/18/2023   11:32 AM 03/02/2021    1:13 PM  CBC  WBC 4.0 - 10.5 K/uL 5.9  6.5  8.3   Hemoglobin 12.0 - 15.0 g/dL 87.3  87.6  88.4   Hematocrit 36.0 - 46.0 % 37.4  37.9  34.7   Platelets 150 - 400 K/uL 258  249.0  282.0       Latest Ref Rng & Units 11/27/2023   12:23 PM 02/18/2023   11:32 AM 03/13/2022   11:16 AM  CMP  Glucose 70 - 99 mg/dL 830  94  85   BUN 8 - 23 mg/dL 17  15  15    Creatinine 0.44 - 1.00 mg/dL 8.53  8.60  8.78   Sodium 135 - 145 mmol/L 139  140  144   Potassium 3.5 - 5.1 mmol/L 4.1  4.0  4.2   Chloride 98 - 111 mmol/L 103  103  103   CO2 22 - 32 mmol/L 27  29  26    Calcium 8.9 - 10.3 mg/dL 9.1  8.8  9.5   Total Protein 6.5 - 8.1 g/dL 6.8  6.6    Total Bilirubin 0.0 - 1.2 mg/dL 0.4  0.6    Alkaline Phos 38 - 126 U/L 76  78    AST 15 - 41 U/L 19  13    ALT 0 - 44 U/L 11  9        RADIOGRAPHIC STUDIES: I have personally reviewed the radiological images as listed and agreed with the findings in the report. No results found.   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 -Giving the early stage disease and advanced age, she is a candidate for lumpectomy without sentinel lymph node biopsy. She is agreeable with that. She was seen by Dr. Ebbie today and likely will proceed with surgery soon.  -Due to her favorable prognostic panel and her advanced age, would not obtain Oncotype.  Is not a candidate for chemotherapy. -Giving the strong ER and PR expression in her postmenopausal status, I recommend adjuvant endocrine therapy with aromatase inhibitor for a total of 5 years to reduce the risk of cancer recurrence. Potential benefits and side effects were discussed with patient and she is interested.  However due to her advanced age and very limited social support, if she experience significant side effect, I have low threshold to stop. -She was also seen by radiation oncologist Dr. Shannon today.  Due to her advanced age and favorable prognostic markers, adjuvant radiation is optional. -We also discussed the breast cancer surveillance after her surgery. She will continue annual screening mammogram, self exam, and a routine office visit with lab and exam with us . -I encouraged her to have healthy diet and exercise regularly.    Plan - Imaging and biopsy reviewed with patient - Will proceed with left breast lumpectomy soon - Plan to see her back after surgery to finalize her adjuvant aromatase inhibitor - I ordered a bone density scan to be done in the next few months, she will call [facility].    Orders Placed This Encounter  Procedures   DG Bone  Density    Standing Status:   Future    Expected Date:   12/28/2023    Expiration Date:   11/26/2024    Reason for Exam (SYMPTOM  OR DIAGNOSIS REQUIRED):   screening    Preferred imaging location?:   External    Release to patient:   Immediate    All questions were answered. The patient knows to call the clinic with any problems, questions or concerns. I spent 40 minutes counseling the patient face to face. The total time spent in the appointment was 45 minutes including review of chart and various tests results, discussions about plan of care and coordination of care plan.     Onita Mattock, MD 11/27/2023

## 2023-11-28 ENCOUNTER — Encounter: Payer: Self-pay | Admitting: General Practice

## 2023-11-28 ENCOUNTER — Encounter: Payer: Self-pay | Admitting: Diagnostic Radiology

## 2023-11-28 ENCOUNTER — Other Ambulatory Visit: Payer: Self-pay | Admitting: General Surgery

## 2023-11-28 DIAGNOSIS — C50212 Malignant neoplasm of upper-inner quadrant of left female breast: Secondary | ICD-10-CM

## 2023-11-28 NOTE — Progress Notes (Signed)
 [facility] Spiritual Care Note  Attempted follow-up call, but no answer. Plan to try again today/tomorrow as needed. Ms Perkins also has direct Spiritual Care number.   [address] [name], [place], [facility] Pager [phone] Voicemail [phone]

## 2023-11-28 NOTE — Progress Notes (Signed)
 [facility] Multidisciplinary [facility] Spiritual Care Note  Met with [name] in Breast Multidisciplinary Clinic to introduce Support Center team/resources.  She completed SDOH screening; results follow below.  SDOH Interventions    Flowsheet Row Office Visit from 01/18/2022 in [facility] Office Visit from 10/26/2020 in [facility] Office Visit from 06/16/2020 in [facility] [place]  SDOH Interventions     Depression Interventions/Treatment  Medication PHQ2-9 Score <4 Follow-up Not Indicated PHQ2-9 Score <4 Follow-up Not Indicated    SDOH Screenings   Food Insecurity: Food Insecurity Present (11/28/2023)  Housing: Low Risk  (11/28/2023)  Transportation Needs: No Transportation Needs (11/28/2023)  Utilities: Not At Risk (11/28/2023)  Depression (PHQ2-9): Medium Risk (11/28/2023)  Tobacco Use: Medium Risk (11/27/2023)   Chaplain and patient discussed common feelings and emotions when being diagnosed with cancer, and the importance of support during treatment.  Chaplain informed patient of the support team and support services at [facility].  Chaplain provided contact information and encouraged patient to call with any questions or concerns.  For [name], facing cancer has meant confronting her mortality in a very vulnerable way. To process, she performed an extensive life review about many stressors that have shaped her across her lifespan. 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 In recent years she has lost some very close friends due to cancer. Her dog is a significant source of meaning and comfort.  Provided compassionate presence, empathic listening, pastoral reflection, prayer, and encouragement to practice self-care, particularly after the emotional intensity and duration of her sharing. [name] verbalized gratitude for support during this encounter:  Some of these things are things I have never told anyone before. Thank you so much for listening.  Follow up needed: Yes.   Given her level of emotional tiredness after this encounter, plan to follow up by phone tomorrow for brief check-in. At that time, plan to schedule follow-up phone appointment or office visit. Will also place Social Work referral re SDOH screening results.  Duration of encounter: ca two hours.  [address] [name], [place], [facility] Pager [phone] Voicemail [phone]

## 2023-11-29 ENCOUNTER — Encounter: Payer: Self-pay | Admitting: General Practice

## 2023-11-29 NOTE — Progress Notes (Signed)
 [facility] Spiritual Care Note  Followed up with Ms Ewalt by phone for pastoral check-in after lengthy encounter at [facility]. She was very appreciative of call and of chaplain's support through previous encounter, noting how much release she felt at having been able to share her soul and to receive compassionate listening and prayer support.  We plan to follow up by phone in ca two weeks, and she has direct Spiritual Care number in case needs arise in the meantime.   11/29/23 1500  Spiritual Encounters  Type of Visit Follow up  Care provided to: Patient  Reason for visit Routine spiritual support  Spiritual Framework  Presenting Themes Values and beliefs;Coping tools;Impactful experiences and emotions;Significant life change  Community/Connection Limited  Strengths life review/reflection  Patient Stress Factors Health changes;Loss of control  Goals  Clinical Care Goals reduce anxiety re coping with diagnosis  Interventions  Spiritual Care Interventions Made Compassionate presence;Reflective listening;Normalization of emotions;Narrative/life review;Explored values/beliefs/practices/strengths;Meaning making  Intervention Outcomes  Outcomes Awareness around self/spiritual resourses;Connection to values and goals of care;Awareness of support;Reduced isolation;Reduced anxiety    Chaplain [name], [place], [facility] Pager [phone] Voicemail [phone]

## 2023-12-02 ENCOUNTER — Telehealth: Payer: Self-pay | Admitting: *Deleted

## 2023-12-02 NOTE — Telephone Encounter (Signed)
 Followed up with [name] this afternoon regarding [employer] clinical trial. Pt stated that she's not in the mood to participate in any clinical trials at this time because she has too much going on. Informed pt if she changes her mind or has any questions she can call us . Pt was thanked for her time and consideration.

## 2023-12-05 ENCOUNTER — Encounter: Payer: Self-pay | Admitting: *Deleted

## 2023-12-05 ENCOUNTER — Telehealth: Payer: Self-pay | Admitting: *Deleted

## 2023-12-05 DIAGNOSIS — C50212 Malignant neoplasm of upper-inner quadrant of left female breast: Secondary | ICD-10-CM

## 2023-12-05 NOTE — Telephone Encounter (Signed)
 Spoke with patient to follow up from [facility] and assess navigation needs. Confirmed appts and times.Encouraged her to call with any questions or concerns.

## 2023-12-09 ENCOUNTER — Telehealth: Payer: Self-pay | Admitting: Radiation Oncology

## 2023-12-09 NOTE — Telephone Encounter (Signed)
 Called patient to schedule consult per 7/17 referral. Patient declined to schedule and stated she wanted to cancel her upcoming surgery due to being overwhelmed. Sent message to referring team.

## 2023-12-10 ENCOUNTER — Encounter: Payer: Self-pay | Admitting: Internal Medicine

## 2023-12-10 ENCOUNTER — Ambulatory Visit: Admitting: Internal Medicine

## 2023-12-10 VITALS — BP 156/82 | HR 70 | Temp 97.9°F | Ht 66.0 in | Wt 155.0 lb

## 2023-12-10 DIAGNOSIS — E034 Atrophy of thyroid (acquired): Secondary | ICD-10-CM | POA: Diagnosis not present

## 2023-12-10 DIAGNOSIS — F329 Major depressive disorder, single episode, unspecified: Secondary | ICD-10-CM | POA: Diagnosis not present

## 2023-12-10 DIAGNOSIS — N1831 Chronic kidney disease, stage 3a: Secondary | ICD-10-CM | POA: Diagnosis not present

## 2023-12-10 DIAGNOSIS — Z170 Estrogen receptor positive status [ER+]: Secondary | ICD-10-CM | POA: Diagnosis not present

## 2023-12-10 DIAGNOSIS — F419 Anxiety disorder, unspecified: Secondary | ICD-10-CM

## 2023-12-10 DIAGNOSIS — C50212 Malignant neoplasm of upper-inner quadrant of left female breast: Secondary | ICD-10-CM | POA: Diagnosis not present

## 2023-12-10 DIAGNOSIS — M10072 Idiopathic gout, left ankle and foot: Secondary | ICD-10-CM

## 2023-12-10 MED ORDER — CLORAZEPATE DIPOTASSIUM 15 MG PO TABS
ORAL_TABLET | ORAL | 1 refills | Status: DC
Start: 2023-12-10 — End: 2024-04-07

## 2023-12-10 MED ORDER — DIPHENOXYLATE-ATROPINE 2.5-0.025 MG PO TABS: 1.0000 | ORAL_TABLET | Freq: Four times a day (QID) | ORAL | 1 refills | Status: DC | PRN

## 2023-12-10 MED ORDER — METHYLPREDNISOLONE 4 MG PO TBPK: ORAL_TABLET | ORAL | 0 refills | Status: DC

## 2023-12-10 NOTE — Assessment & Plan Note (Addendum)
 [name] decided not to undergo any breast cancer treatments offered to her. We discussed. I disagree with her decision.SABRASABRAI asked her to reconsider her decision and to meet w/her Oncology team again. Stress is worse Take Tranxene  prn

## 2023-12-10 NOTE — Assessment & Plan Note (Signed)
 SABRA

## 2023-12-10 NOTE — Assessment & Plan Note (Signed)
 Cont w/Doxepin  at hs Discussed breast cancer

## 2023-12-10 NOTE — Progress Notes (Signed)
 Subjective:  Patient ID: [name], female    DOB: April 12, 1946  Age: 78 y.o. MRN: [id number]  CC: Medical Management of Chronic Issues (4 week f/u, discuss recent left breast cancer dx... Pt states she is still having issues with her rt leg after her fall in January 2025)   HPI Audryna Wendt Sykora presents for L breast cancer dx. 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 -Giving the early stage disease and advanced age, she is a candidate for lumpectomy without sentinel lymph node biopsy. She is agreeable with that. She was seen by Dr. Ebbie today and likely will proceed with surgery soon.  -Due to her favorable prognostic panel and her advanced age, would not obtain Oncotype.  Is not a candidate for chemotherapy. -Giving the strong ER and PR expression in her postmenopausal status, I recommend adjuvant endocrine therapy with aromatase inhibitor for a total of 5 years to reduce the risk of cancer recurrence. Potential benefits and side effects were discussed with patient and she is interested.  However due to her advanced age and very limited social support, if she experience significant side effect, I have low threshold to stop. -She was also seen by radiation oncologist Dr. Shannon today.  Due to her advanced age and favorable prognostic markers, adjuvant radiation is optional. -We also discussed the breast cancer surveillance after her surgery. She will continue annual screening mammogram, self exam, and a routine office visit with lab and exam with us . -I encouraged her to have healthy diet and exercise regularly.    Outpatient Medications Prior to Visit  Medication Sig Dispense Refill   ampicillin  (PRINCIPEN) 500 MG capsule Take 1 capsule (500 mg total) by mouth 4 (four) times daily. 28 capsule 1   aspirin  EC 81 MG  tablet Take 1 tablet (81 mg total) by mouth 2 (two) times daily.     Cholecalciferol 1000 UNITS tablet Take 1,000 Units by mouth daily.     clobetasol  ointment (TEMOVATE ) 0.05 % Apply 1 application. topically 2 (two) times daily as needed. 30 g 3   doxepin  (SINEQUAN ) 50 MG capsule Take 3 capsules (150 mg total) by mouth at bedtime. 90 capsule 3   furosemide  (LASIX ) 40 MG tablet Take 1 tablet (40 mg total) by mouth 2 (two) times daily. 180 tablet 3   levothyroxine  (SYNTHROID ) 100 MCG tablet Start Levothyroxine  100 mcg every other day and 50 mcg every other day. 90 tablet 3   LORazepam  (ATIVAN ) 0.5 MG tablet Take 1 tablet (0.5 mg total) by mouth at bedtime. 90 tablet 1   metoprolol  tartrate (LOPRESSOR ) 50 MG tablet Take 1.5 tablets (75 mg total) by mouth 3 (three) times daily. 405 tablet 3   NIFEdipine  (PROCARDIA  XL) 60 MG 24 hr tablet Take 1 tablet (60 mg total) by mouth daily. 90 tablet 3   nitrofurantoin , macrocrystal-monohydrate, (MACROBID ) 100 MG capsule Take 1 capsule (100 mg total) by mouth 2 (two) times daily. 10 capsule 0   nitroGLYCERIN  (NITROLINGUAL ) 0.4 MG/SPRAY spray PLACE 1 SPRAY UNDER THE TONGUE EVERY 5 MINUTES AS NEEDED FOR CHEST PAIN. MAY REPEAT 3 TIMES 4.9 g 6   potassium chloride  SA (KLOR-CON  M) 20 MEQ tablet Take 1 tablet (20 mEq total) by mouth daily. 90 tablet 3   triamcinolone  ointment (KENALOG ) 0.1 % Apply 1 Application topically 4 (four)  times daily as needed. On hemorrhoids 80 g 2   clorazepate  (TRANXENE ) 15 MG tablet TAKE 1 TABLET BY MOUTH THREE TIMES DAILY AS NEEDED FOR ANXIETY. DO  NOT  TAKE  WITH  LORAZEPAM . 270 tablet 1   diphenoxylate -atropine  (LOMOTIL ) 2.5-0.025 MG tablet Take 1 tablet by mouth 4 (four) times daily as needed for diarrhea or loose stools. 40 tablet 1   methylPREDNISolone  (MEDROL  DOSEPAK) 4 MG TBPK tablet As directed 21 tablet 0   estradiol  (ESTRACE ) 0.1 MG/GM vaginal cream Place 1 Applicatorful vaginally as needed (irritation). (Patient not taking:  Reported on 12/10/2023) 42.5 g 3   estradiol  cypionate (DEPO-ESTRADIOL ) 5 MG/ML injection Use 0.3 ml every 18-25 days IM (Patient not taking: Reported on 12/10/2023) 5 mL 3   No facility-administered medications prior to visit.    ROS: Review of Systems  Constitutional:  Negative for activity change, appetite change, chills, fatigue and unexpected weight change.  HENT:  Negative for congestion, mouth sores and sinus pressure.   Eyes:  Negative for visual disturbance.  Respiratory:  Negative for cough and chest tightness.   Gastrointestinal:  Negative for abdominal pain and nausea.  Genitourinary:  Negative for difficulty urinating, frequency and vaginal pain.  Musculoskeletal:  Negative for back pain and gait problem.  Skin:  Negative for pallor and rash.  Neurological:  Negative for dizziness, tremors, weakness, numbness and headaches.  Psychiatric/Behavioral:  Negative for confusion and sleep disturbance. The patient is not nervous/anxious.     Objective:  BP (!) 156/82   Pulse 70   Temp 97.9 F (36.6 C) (Oral)   Ht 5' 6 (1.676 m)   Wt 155 lb (70.3 kg)   SpO2 98%   BMI 25.02 kg/m   BP Readings from Last 3 Encounters:  12/10/23 (!) 156/82  11/27/23 (!) 195/74  11/19/23 118/68    Wt Readings from Last 3 Encounters:  12/10/23 155 lb (70.3 kg)  11/27/23 157 lb 11.2 oz (71.5 kg)  11/19/23 158 lb (71.7 kg)    Physical Exam Constitutional:      General: She is not in acute distress.    Appearance: She is well-developed.  HENT:     Head: Normocephalic.     Right Ear: External ear normal.     Left Ear: External ear normal.     Nose: Nose normal.  Eyes:     General:        Right eye: No discharge.        Left eye: No discharge.     Conjunctiva/sclera: Conjunctivae normal.     Pupils: Pupils are equal, round, and reactive to light.  Neck:     Thyroid : No thyromegaly.     Vascular: No JVD.     Trachea: No tracheal deviation.  Cardiovascular:     Rate and Rhythm:  Normal rate and regular rhythm.     Heart sounds: Normal heart sounds.  Pulmonary:     Effort: No respiratory distress.     Breath sounds: No stridor. No wheezing.  Abdominal:     General: Bowel sounds are normal. There is no distension.     Palpations: Abdomen is soft. There is no mass.     Tenderness: There is no abdominal tenderness. There is no guarding or rebound.  Musculoskeletal:        General: No tenderness.     Cervical back: Normal range of motion and neck supple. No rigidity.     Right lower leg: No edema.  Left lower leg: No edema.  Lymphadenopathy:     Cervical: No cervical adenopathy.  Skin:    Findings: No erythema or rash.  Neurological:     Cranial Nerves: No cranial nerve deficit.     Motor: No abnormal muscle tone.     Coordination: Coordination normal.     Deep Tendon Reflexes: Reflexes normal.  Psychiatric:        Behavior: Behavior normal.        Thought Content: Thought content normal.        Judgment: Judgment normal.     Lab Results  Component Value Date   WBC 5.9 11/27/2023   HGB 12.6 11/27/2023   HCT 37.4 11/27/2023   PLT 258 11/27/2023   GLUCOSE 169 (H) 11/27/2023   CHOL 237 (H) 02/18/2023   TRIG 138.0 02/18/2023   HDL 66.00 02/18/2023   LDLDIRECT 188.2 01/10/2012   LDLCALC 143 (H) 02/18/2023   ALT 11 11/27/2023   AST 19 11/27/2023   NA 139 11/27/2023   K 4.1 11/27/2023   CL 103 11/27/2023   CREATININE 1.46 (H) 11/27/2023   BUN 17 11/27/2023   CO2 27 11/27/2023   TSH 1.22 02/18/2023   INR 1.0 08/01/2018    No results found.  Assessment & Plan:   Problem List Items Addressed This Visit     Anxiety disorder   Karlynn decided not to undergo any breast cancer treatments offered to her. We discussed. I disagree with her decision.SABRASABRAI asked her to reconsider her decision and to meet w/her Oncology team again. Stress is worse Take Tranxene  prn      Relevant Medications   clorazepate  (TRANXENE ) 15 MG tablet   Depression   Cont  w/Doxepin  at hs Discussed breast cancer      Relevant Medications   clorazepate  (TRANXENE ) 15 MG tablet   Gout   Medrol  dose pack as needed Off Allopurinol        Hypothyroidism          Malignant neoplasm of upper-inner quadrant of left breast in female, estrogen receptor positive (HCC) - Primary   Letonya decided not to undergo any breast cancer treatments offered to her. We discussed. I disagree with her decision.SABRASABRAI asked her to reconsider her decision and to meet w/her Oncology team again.       Relevant Medications   methylPREDNISolone  (MEDROL  DOSEPAK) 4 MG TBPK tablet   clorazepate  (TRANXENE ) 15 MG tablet   Stage 3a chronic kidney disease (HCC)   Hydrate well Continue to monitor GFR         Meds ordered this encounter  Medications   methylPREDNISolone  (MEDROL  DOSEPAK) 4 MG TBPK tablet    Sig: As directed    Dispense:  21 tablet    Refill:  0   clorazepate  (TRANXENE ) 15 MG tablet    Sig: TAKE 1 TABLET BY MOUTH THREE TIMES DAILY AS NEEDED FOR ANXIETY. DO NOT TAKE WITH LORAZEPAM .    Dispense:  270 tablet    Refill:  1   diphenoxylate -atropine  (LOMOTIL ) 2.5-0.025 MG tablet    Sig: Take 1 tablet by mouth 4 (four) times daily as needed for diarrhea or loose stools.    Dispense:  40 tablet    Refill:  1      Follow-up: Return in about 4 weeks (around 01/07/2024) for a follow-up visit.  Marolyn Noel, MD

## 2023-12-10 NOTE — Assessment & Plan Note (Signed)
Hydrate well.  Continue to monitor GFR 

## 2023-12-10 NOTE — Assessment & Plan Note (Addendum)
 [name] decided not to undergo any breast cancer treatments offered to her. We discussed. I disagree with her decision.SABRASABRAI asked her to reconsider her decision and to meet w/her Oncology team again.

## 2023-12-11 ENCOUNTER — Other Ambulatory Visit (HOSPITAL_COMMUNITY)

## 2023-12-12 ENCOUNTER — Encounter: Payer: Self-pay | Admitting: General Practice

## 2023-12-12 NOTE — Progress Notes (Signed)
 [facility] Spiritual Care Note  Followed up with [name] by phone as scheduled, providing opportunity for her to process her cancellation of her lumpectomy surgery. She noted that she still feels some unclarity about that decision, which was partially fueled by anxiety about logistics (as she lives alone and has a dog, and doesn't have someone whom she might ask for help).  We plan to speak again on Friday, August 1 for further discernment. She wishes to take a week to relax from the distress of receiving her diagnosis and treatment plan before revisiting her decision, and then may desire to accept Dr Gelene offer for a follow-up visit for further discussion.  [address] [name], [place], [facility] Pager [phone] Voicemail [phone]

## 2023-12-18 ENCOUNTER — Ambulatory Visit (HOSPITAL_COMMUNITY): Admit: 2023-12-18 | Admitting: General Surgery

## 2023-12-18 SURGERY — LUMPECTOMY WITH MAGNETIC MARKER LOCALIZATION
Anesthesia: General | Laterality: Left

## 2023-12-19 ENCOUNTER — Telehealth: Payer: Self-pay | Admitting: *Deleted

## 2023-12-19 NOTE — Telephone Encounter (Signed)
 Attempted to call patient regarding anti-estrogen therapy.  No answer and no VM.

## 2023-12-20 ENCOUNTER — Encounter: Payer: Self-pay | Admitting: General Practice

## 2023-12-20 ENCOUNTER — Ambulatory Visit: Admitting: Internal Medicine

## 2023-12-20 NOTE — Progress Notes (Signed)
 [facility] Spiritual Care Note  Phoned Ms [name] as planned for follow-up support/discernment call, but had to leave voicemail. Also included direct Spiritual Care number. Plan to follow up next week.  [address] [name], [place], [facility] Pager [phone] Voicemail [phone]

## 2023-12-23 ENCOUNTER — Encounter: Payer: Self-pay | Admitting: General Practice

## 2023-12-23 NOTE — Progress Notes (Signed)
 [facility] Spiritual Care Note  After leaving voicemail on Friday 12/20/2023 on the occasion of a planned phone call, left a second voicemail today with direct Spiritual Care number and encouragement to return call.  [address] [name], [place], [facility] Pager [phone] Voicemail [phone]

## 2023-12-24 ENCOUNTER — Telehealth: Admitting: Hematology

## 2023-12-30 NOTE — Assessment & Plan Note (Signed)
 Medrol  dose pack as needed Off Allopurinol 

## 2024-01-06 ENCOUNTER — Encounter: Payer: Self-pay | Admitting: *Deleted

## 2024-01-13 ENCOUNTER — Encounter: Payer: Self-pay | Admitting: Internal Medicine

## 2024-01-13 ENCOUNTER — Ambulatory Visit: Admitting: Internal Medicine

## 2024-01-13 VITALS — BP 136/78 | HR 66 | Temp 98.6°F | Ht 66.0 in | Wt 156.0 lb

## 2024-01-13 DIAGNOSIS — N1831 Chronic kidney disease, stage 3a: Secondary | ICD-10-CM | POA: Diagnosis not present

## 2024-01-13 DIAGNOSIS — C50212 Malignant neoplasm of upper-inner quadrant of left female breast: Secondary | ICD-10-CM | POA: Diagnosis not present

## 2024-01-13 DIAGNOSIS — F419 Anxiety disorder, unspecified: Secondary | ICD-10-CM

## 2024-01-13 DIAGNOSIS — I25119 Atherosclerotic heart disease of native coronary artery with unspecified angina pectoris: Secondary | ICD-10-CM

## 2024-01-13 DIAGNOSIS — E876 Hypokalemia: Secondary | ICD-10-CM

## 2024-01-13 DIAGNOSIS — N951 Menopausal and female climacteric states: Secondary | ICD-10-CM

## 2024-01-13 DIAGNOSIS — Z170 Estrogen receptor positive status [ER+]: Secondary | ICD-10-CM | POA: Diagnosis not present

## 2024-01-13 NOTE — Assessment & Plan Note (Signed)
 [name] decided again not to undergo any breast cancer treatments offered to her... Tagan refused repeat tests

## 2024-01-13 NOTE — Assessment & Plan Note (Signed)
Off HRT.

## 2024-01-13 NOTE — Assessment & Plan Note (Signed)
Check BMET 

## 2024-01-13 NOTE — Assessment & Plan Note (Signed)
Chronic. No angina Cont on Toprol, Procardia

## 2024-01-13 NOTE — Progress Notes (Signed)
 Subjective:  Patient ID: [name], female    DOB: 1946/04/03  Age: 78 y.o. MRN: [id number]  CC: Medical Management of Chronic Issues (4 week f/u )   HPI [name] presents for breast cancer, anxiety, gout  Outpatient Medications Prior to Visit  Medication Sig Dispense Refill   ampicillin  (PRINCIPEN) 500 MG capsule Take 1 capsule (500 mg total) by mouth 4 (four) times daily. 28 capsule 1   aspirin  EC 81 MG tablet Take 1 tablet (81 mg total) by mouth 2 (two) times daily.     Cholecalciferol 1000 UNITS tablet Take 1,000 Units by mouth daily.     clobetasol  ointment (TEMOVATE ) 0.05 % Apply 1 application. topically 2 (two) times daily as needed. 30 g 3   clorazepate  (TRANXENE ) 15 MG tablet TAKE 1 TABLET BY MOUTH THREE TIMES DAILY AS NEEDED FOR ANXIETY. DO NOT TAKE WITH LORAZEPAM . 270 tablet 1   diphenoxylate -atropine  (LOMOTIL ) 2.5-0.025 MG tablet Take 1 tablet by mouth 4 (four) times daily as needed for diarrhea or loose stools. 40 tablet 1   doxepin  (SINEQUAN ) 50 MG capsule Take 3 capsules (150 mg total) by mouth at bedtime. 90 capsule 3   furosemide  (LASIX ) 40 MG tablet Take 1 tablet (40 mg total) by mouth 2 (two) times daily. 180 tablet 3   levothyroxine  (SYNTHROID ) 100 MCG tablet Start Levothyroxine  100 mcg every other day and 50 mcg every other day. 90 tablet 3   LORazepam  (ATIVAN ) 0.5 MG tablet Take 1 tablet (0.5 mg total) by mouth at bedtime. 90 tablet 1   methylPREDNISolone  (MEDROL  DOSEPAK) 4 MG TBPK tablet As directed 21 tablet 0   metoprolol  tartrate (LOPRESSOR ) 50 MG tablet Take 1.5 tablets (75 mg total) by mouth 3 (three) times daily. 405 tablet 3   NIFEdipine  (PROCARDIA  XL) 60 MG 24 hr tablet Take 1 tablet (60 mg total) by mouth daily. 90 tablet 3   nitrofurantoin , macrocrystal-monohydrate, (MACROBID ) 100 MG capsule Take 1 capsule (100 mg total) by mouth 2 (two) times daily. 10 capsule 0   nitroGLYCERIN  (NITROLINGUAL ) 0.4 MG/SPRAY spray PLACE 1 SPRAY UNDER THE TONGUE EVERY 5  MINUTES AS NEEDED FOR CHEST PAIN. MAY REPEAT 3 TIMES 4.9 g 6   potassium chloride  SA (KLOR-CON  M) 20 MEQ tablet Take 1 tablet (20 mEq total) by mouth daily. 90 tablet 3   triamcinolone  ointment (KENALOG ) 0.1 % Apply 1 Application topically 4 (four) times daily as needed. On hemorrhoids 80 g 2   estradiol  (ESTRACE ) 0.1 MG/GM vaginal cream Place 1 Applicatorful vaginally as needed (irritation). (Patient not taking: Reported on 12/10/2023) 42.5 g 3   estradiol  cypionate (DEPO-ESTRADIOL ) 5 MG/ML injection Use 0.3 ml every 18-25 days IM (Patient not taking: Reported on 12/10/2023) 5 mL 3   No facility-administered medications prior to visit.    ROS: Review of Systems  Constitutional:  Negative for activity change, appetite change, chills, fatigue and unexpected weight change.  HENT:  Negative for congestion, mouth sores and sinus pressure.   Eyes:  Negative for visual disturbance.  Respiratory:  Negative for cough and chest tightness.   Gastrointestinal:  Negative for abdominal pain and nausea.  Genitourinary:  Negative for difficulty urinating, frequency and vaginal pain.  Musculoskeletal:  Positive for arthralgias. Negative for back pain and gait problem.  Skin:  Negative for pallor and rash.  Neurological:  Negative for dizziness, tremors, weakness, numbness and headaches.  Psychiatric/Behavioral:  Positive for dysphoric mood. Negative for confusion, sleep disturbance and suicidal ideas. The patient  is nervous/anxious.     Objective:  BP 136/78   Pulse 66   Temp 98.6 F (37 C) (Oral)   Ht 5' 6 (1.676 m)   Wt 156 lb (70.8 kg)   SpO2 97%   BMI 25.18 kg/m   BP Readings from Last 3 Encounters:  01/13/24 136/78  12/10/23 (!) 156/82  11/27/23 (!) 195/74    Wt Readings from Last 3 Encounters:  01/13/24 156 lb (70.8 kg)  12/10/23 155 lb (70.3 kg)  11/27/23 157 lb 11.2 oz (71.5 kg)    Physical Exam Constitutional:      General: She is not in acute distress.    Appearance: She is  well-developed.  HENT:     Head: Normocephalic.     Right Ear: External ear normal.     Left Ear: External ear normal.     Nose: Nose normal.  Eyes:     General:        Right eye: No discharge.        Left eye: No discharge.     Conjunctiva/sclera: Conjunctivae normal.     Pupils: Pupils are equal, round, and reactive to light.  Neck:     Thyroid : No thyromegaly.     Vascular: No JVD.     Trachea: No tracheal deviation.  Cardiovascular:     Rate and Rhythm: Normal rate and regular rhythm.     Heart sounds: Normal heart sounds.  Pulmonary:     Effort: No respiratory distress.     Breath sounds: No stridor. No wheezing.  Abdominal:     General: Bowel sounds are normal. There is no distension.     Palpations: Abdomen is soft. There is no mass.     Tenderness: There is no abdominal tenderness. There is no guarding or rebound.  Musculoskeletal:        General: No tenderness.     Cervical back: Normal range of motion and neck supple. No rigidity.  Lymphadenopathy:     Cervical: No cervical adenopathy.  Skin:    Findings: No erythema or rash.  Neurological:     Cranial Nerves: No cranial nerve deficit.     Motor: No abnormal muscle tone.     Coordination: Coordination normal.     Deep Tendon Reflexes: Reflexes normal.  Psychiatric:        Behavior: Behavior normal.        Thought Content: Thought content normal.        Judgment: Judgment normal.     Lab Results  Component Value Date   WBC 5.9 11/27/2023   HGB 12.6 11/27/2023   HCT 37.4 11/27/2023   PLT 258 11/27/2023   GLUCOSE 169 (H) 11/27/2023   CHOL 237 (H) 02/18/2023   TRIG 138.0 02/18/2023   HDL 66.00 02/18/2023   LDLDIRECT 188.2 01/10/2012   LDLCALC 143 (H) 02/18/2023   ALT 11 11/27/2023   AST 19 11/27/2023   NA 139 11/27/2023   K 4.1 11/27/2023   CL 103 11/27/2023   CREATININE 1.46 (H) 11/27/2023   BUN 17 11/27/2023   CO2 27 11/27/2023   TSH 1.22 02/18/2023   INR 1.0 08/01/2018    No results  found.  Assessment & Plan:   Problem List Items Addressed This Visit     Anxiety disorder   Rieley decided again not to undergo any breast cancer treatments offered to her... 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 No angina Cont on Toprol , Procardia       Hot flash, menopausal   Off HRT      Hypokalemia   Check BMET      Malignant neoplasm of upper-inner quadrant of left breast in female, estrogen receptor positive (HCC) - Primary   Sherica decided again not to undergo any breast cancer treatments offered to her... Georganna refused repeat tests      Stage 3a chronic kidney disease (HCC)   Hydrate well Continue to monitor GFR         No orders of the defined types were placed in this encounter.     Follow-up: Return in about 4 weeks (around 02/10/2024) for a follow-up visit.  Marolyn Noel, MD

## 2024-01-13 NOTE — Assessment & Plan Note (Signed)
Hydrate well.  Continue to monitor GFR 

## 2024-01-13 NOTE — Assessment & Plan Note (Signed)
 [name] decided again not to undergo any breast cancer treatments offered to her... [name] refused repeat tests

## 2024-01-23 ENCOUNTER — Other Ambulatory Visit: Payer: Self-pay | Admitting: Internal Medicine

## 2024-02-13 ENCOUNTER — Ambulatory Visit (INDEPENDENT_AMBULATORY_CARE_PROVIDER_SITE_OTHER): Admitting: Internal Medicine

## 2024-02-13 ENCOUNTER — Encounter: Payer: Self-pay | Admitting: Internal Medicine

## 2024-02-13 ENCOUNTER — Telehealth: Payer: Self-pay

## 2024-02-13 VITALS — BP 166/80 | HR 62 | Resp 16 | Ht 66.0 in | Wt 156.4 lb

## 2024-02-13 DIAGNOSIS — C50212 Malignant neoplasm of upper-inner quadrant of left female breast: Secondary | ICD-10-CM | POA: Diagnosis not present

## 2024-02-13 DIAGNOSIS — E034 Atrophy of thyroid (acquired): Secondary | ICD-10-CM

## 2024-02-13 DIAGNOSIS — F419 Anxiety disorder, unspecified: Secondary | ICD-10-CM | POA: Diagnosis not present

## 2024-02-13 DIAGNOSIS — Z170 Estrogen receptor positive status [ER+]: Secondary | ICD-10-CM

## 2024-02-13 DIAGNOSIS — M79604 Pain in right leg: Secondary | ICD-10-CM

## 2024-02-13 MED ORDER — LORAZEPAM 0.5 MG PO TABS: 0.5000 mg | ORAL_TABLET | Freq: Every day | ORAL | 1 refills | Status: DC

## 2024-02-13 MED ORDER — LEVOTHYROXINE SODIUM 100 MCG PO TABS: ORAL_TABLET | ORAL | 3 refills | Status: AC

## 2024-02-13 NOTE — Progress Notes (Signed)
 Subjective:  Patient ID: [name], female    DOB: April 02, 1946  Age: 78 y.o. MRN: [id number]  CC: Follow-up (4 wk f/u) and medicine problem (Patient states she only wants to tell us  the problem once regarding her Levothyroxine )   HPI Sherron JAYSON Metzer presents for leg pain, anxiety, breast cancer f/u  Outpatient Medications Prior to Visit  Medication Sig Dispense Refill   levothyroxine  (SYNTHROID ) 100 MCG tablet Start Levothyroxine  100 mcg every other day and 50 mcg every other day. 90 tablet 3   ampicillin  (PRINCIPEN) 500 MG capsule Take 1 capsule (500 mg total) by mouth 4 (four) times daily. 28 capsule 1   aspirin  EC 81 MG tablet Take 1 tablet (81 mg total) by mouth 2 (two) times daily.     Cholecalciferol 1000 UNITS tablet Take 1,000 Units by mouth daily.     clobetasol  ointment (TEMOVATE ) 0.05 % Apply 1 application. topically 2 (two) times daily as needed. (Patient not taking: Reported on 02/13/2024) 30 g 3   clorazepate  (TRANXENE ) 15 MG tablet TAKE 1 TABLET BY MOUTH THREE TIMES DAILY AS NEEDED FOR ANXIETY. DO NOT TAKE WITH LORAZEPAM . 270 tablet 1   diphenoxylate -atropine  (LOMOTIL ) 2.5-0.025 MG tablet Take 1 tablet by mouth 4 (four) times daily as needed for diarrhea or loose stools. 40 tablet 1   doxepin  (SINEQUAN ) 50 MG capsule Take 3 capsules (150 mg total) by mouth at bedtime. 90 capsule 3   estradiol  (ESTRACE ) 0.1 MG/GM vaginal cream Place 1 Applicatorful vaginally as needed (irritation). (Patient not taking: Reported on 12/10/2023) 42.5 g 3   estradiol  cypionate (DEPO-ESTRADIOL ) 5 MG/ML injection Use 0.3 ml every 18-25 days IM (Patient not taking: Reported on 12/10/2023) 5 mL 3   furosemide  (LASIX ) 40 MG tablet Take 1 tablet (40 mg total) by mouth 2 (two) times daily. 180 tablet 3   methylPREDNISolone  (MEDROL  DOSEPAK) 4 MG TBPK tablet As directed 21 tablet 0   metoprolol  tartrate (LOPRESSOR ) 50 MG tablet Take 1.5 tablets (75 mg total) by mouth 3 (three) times daily. 405 tablet 3    NIFEdipine  (PROCARDIA  XL) 60 MG 24 hr tablet Take 1 tablet (60 mg total) by mouth daily. 90 tablet 3   nitrofurantoin , macrocrystal-monohydrate, (MACROBID ) 100 MG capsule Take 1 capsule (100 mg total) by mouth 2 (two) times daily. 10 capsule 0   nitroGLYCERIN  (NITROLINGUAL ) 0.4 MG/SPRAY spray PLACE 1 SPRAY UNDER THE TONGUE EVERY 5 MINUTES AS NEEDED FOR CHEST PAIN. MAY REPEAT 3 TIMES 4.9 g 6   potassium chloride  SA (KLOR-CON  M) 20 MEQ tablet Take 1 tablet (20 mEq total) by mouth daily. 90 tablet 3   triamcinolone  ointment (KENALOG ) 0.1 % Apply 1 Application topically 4 (four) times daily as needed. On hemorrhoids 80 g 2   LORazepam  (ATIVAN ) 0.5 MG tablet TAKE 1 TABLET BY MOUTH AT BEDTIME 90 tablet 0   No facility-administered medications prior to visit.    ROS: Review of Systems  Constitutional:  Negative for activity change, appetite change, chills, fatigue and unexpected weight change.  HENT:  Negative for congestion, mouth sores and sinus pressure.   Eyes:  Negative for visual disturbance.  Respiratory:  Negative for cough and chest tightness.   Gastrointestinal:  Negative for abdominal pain and nausea.  Genitourinary:  Negative for difficulty urinating, frequency and vaginal pain.  Musculoskeletal:  Negative for back pain and gait problem.  Skin:  Negative for pallor and rash.  Neurological:  Negative for dizziness, tremors, weakness, numbness and headaches.  Psychiatric/Behavioral:  Negative for confusion, sleep disturbance and suicidal ideas.     Objective:  BP (!) 166/80 (BP Location: Left Arm, Patient Position: Sitting)   Pulse 62   Resp 16   Ht 5' 6 (1.676 m)   Wt 156 lb 6.4 oz (70.9 kg)   SpO2 96%   BMI 25.24 kg/m   BP Readings from Last 3 Encounters:  02/13/24 (!) 166/80  01/13/24 136/78  12/10/23 (!) 156/82    Wt Readings from Last 3 Encounters:  02/13/24 156 lb 6.4 oz (70.9 kg)  01/13/24 156 lb (70.8 kg)  12/10/23 155 lb (70.3 kg)    Physical  Exam Constitutional:      General: She is not in acute distress.    Appearance: She is well-developed. She is obese.  HENT:     Head: Normocephalic.     Right Ear: External ear normal.     Left Ear: External ear normal.     Nose: Nose normal.  Eyes:     General:        Right eye: No discharge.        Left eye: No discharge.     Conjunctiva/sclera: Conjunctivae normal.     Pupils: Pupils are equal, round, and reactive to light.  Neck:     Thyroid : No thyromegaly.     Vascular: No JVD.     Trachea: No tracheal deviation.  Cardiovascular:     Rate and Rhythm: Normal rate and regular rhythm.     Heart sounds: Normal heart sounds.  Pulmonary:     Effort: No respiratory distress.     Breath sounds: No stridor. No wheezing.  Abdominal:     General: Bowel sounds are normal. There is no distension.     Palpations: Abdomen is soft. There is no mass.     Tenderness: There is no abdominal tenderness. There is no guarding or rebound.  Musculoskeletal:        General: No tenderness.     Cervical back: Normal range of motion and neck supple. No rigidity.  Lymphadenopathy:     Cervical: No cervical adenopathy.  Skin:    Findings: No erythema or rash.  Neurological:     Mental Status: Mental status is at baseline.     Cranial Nerves: No cranial nerve deficit.     Motor: No abnormal muscle tone.     Coordination: Coordination normal.     Deep Tendon Reflexes: Reflexes normal.  Psychiatric:        Behavior: Behavior normal.        Thought Content: Thought content normal.        Judgment: Judgment normal.   R post thigh pain  - stiff  Lab Results  Component Value Date   WBC 5.9 11/27/2023   HGB 12.6 11/27/2023   HCT 37.4 11/27/2023   PLT 258 11/27/2023   GLUCOSE 169 (H) 11/27/2023   CHOL 237 (H) 02/18/2023   TRIG 138.0 02/18/2023   HDL 66.00 02/18/2023   LDLDIRECT 188.2 01/10/2012   LDLCALC 143 (H) 02/18/2023   ALT 11 11/27/2023   AST 19 11/27/2023   NA 139 11/27/2023   K  4.1 11/27/2023   CL 103 11/27/2023   CREATININE 1.46 (H) 11/27/2023   BUN 17 11/27/2023   CO2 27 11/27/2023   TSH 1.22 02/18/2023   INR 1.0 08/01/2018    No results found.  Assessment & Plan:   Problem List Items Addressed This Visit     Anxiety disorder  Krishika decided again not to undergo any breast cancer treatments offered to her... Kaylianna refused repeat tests       Relevant Medications   LORazepam  (ATIVAN ) 0.5 MG tablet   Hypothyroidism    Continue with levothyroxine       Relevant Medications   levothyroxine  (SYNTHROID ) 100 MCG tablet   Leg pain, medial, right - Primary   C/o RLE chronic pain:  pain started after a fall on ice - tried PT Sprain of inner thigh ligaments, chronic pain Medrol  pack as needed Use heat, stretching      Malignant neoplasm of upper-inner quadrant of left breast in female, estrogen receptor positive (HCC)   Discussed with the patient.  Chantia decided again not to undergo any breast cancer treatments offered to her... Sheril refused repeat tests      Relevant Medications   LORazepam  (ATIVAN ) 0.5 MG tablet      Meds ordered this encounter  Medications   LORazepam  (ATIVAN ) 0.5 MG tablet    Sig: Take 1 tablet (0.5 mg total) by mouth at bedtime.    Dispense:  90 tablet    Refill:  1   levothyroxine  (SYNTHROID ) 100 MCG tablet    Sig: Start Levothyroxine  100 mcg every other day and 50 mcg every other day.    Dispense:  90 tablet    Refill:  3      Follow-up: Return in about 4 weeks (around 03/12/2024) for a follow-up visit.  Marolyn Noel, MD

## 2024-02-13 NOTE — Telephone Encounter (Signed)
 Copied from CRM [id number]. Topic: Clinical - Medication Question >> Feb 13, 2024  3:29 PM Burnard DEL wrote: Reason for CRM: [facility] pharmacy called stating that they cannot get mylan manufactured brand of levothyroxine  (SYNTHROID ) 100 MCG tablet,and they are needing providers approval to switch to alvogen or accord .

## 2024-02-23 NOTE — Assessment & Plan Note (Signed)
 Discussed with the patient.  [name] decided again not to undergo any breast cancer treatments offered to her... [name] refused repeat tests

## 2024-02-23 NOTE — Assessment & Plan Note (Signed)
 [name] decided again not to undergo any breast cancer treatments offered to her... [name] refused repeat tests

## 2024-02-23 NOTE — Assessment & Plan Note (Signed)
 Continue with levothyroxine

## 2024-02-23 NOTE — Assessment & Plan Note (Signed)
 C/o RLE chronic pain:  pain started after a fall on ice - tried PT Sprain of inner thigh ligaments, chronic pain Medrol  pack as needed Use heat, stretching

## 2024-03-02 NOTE — Telephone Encounter (Signed)
Okay with me.  Thank you °

## 2024-03-12 ENCOUNTER — Encounter: Payer: Self-pay | Admitting: Internal Medicine

## 2024-03-12 ENCOUNTER — Ambulatory Visit (INDEPENDENT_AMBULATORY_CARE_PROVIDER_SITE_OTHER): Admitting: Internal Medicine

## 2024-03-12 VITALS — BP 102/62 | HR 68 | Ht 66.0 in | Wt 158.2 lb

## 2024-03-12 DIAGNOSIS — R609 Edema, unspecified: Secondary | ICD-10-CM

## 2024-03-12 DIAGNOSIS — I25119 Atherosclerotic heart disease of native coronary artery with unspecified angina pectoris: Secondary | ICD-10-CM

## 2024-03-12 DIAGNOSIS — F329 Major depressive disorder, single episode, unspecified: Secondary | ICD-10-CM | POA: Diagnosis not present

## 2024-03-12 DIAGNOSIS — N951 Menopausal and female climacteric states: Secondary | ICD-10-CM | POA: Diagnosis not present

## 2024-03-12 DIAGNOSIS — F419 Anxiety disorder, unspecified: Secondary | ICD-10-CM

## 2024-03-12 MED ORDER — CLOBETASOL PROPIONATE 0.05 % EX OINT: 1.0000 | TOPICAL_OINTMENT | Freq: Two times a day (BID) | CUTANEOUS | 3 refills | Status: DC | PRN

## 2024-03-12 MED ORDER — ESTRADIOL 0.01 % VA CREA: 1.0000 | TOPICAL_CREAM | VAGINAL | 12 refills | Status: DC

## 2024-03-12 NOTE — Assessment & Plan Note (Signed)
 [name] decided not to undergo any breast cancer treatments offered to her.SABRASABRA

## 2024-03-12 NOTE — Progress Notes (Signed)
 Subjective:  Patient ID: [name], female    DOB: July 17, 1945  Age: 78 y.o. MRN: [id number]  CC: Leg Pain (4 week follow up for right leg pain)   HPI Gurtha C Calvillo presents for anxiety, breast cancer Darrell had a hot flash 1 hr ago F/u on feet swelling, depression  Outpatient Medications Prior to Visit  Medication Sig Dispense Refill   ampicillin  (PRINCIPEN) 500 MG capsule Take 1 capsule (500 mg total) by mouth 4 (four) times daily. 28 capsule 1   aspirin  EC 81 MG tablet Take 1 tablet (81 mg total) by mouth 2 (two) times daily.     Cholecalciferol 1000 UNITS tablet Take 1,000 Units by mouth daily.     clorazepate  (TRANXENE ) 15 MG tablet TAKE 1 TABLET BY MOUTH THREE TIMES DAILY AS NEEDED FOR ANXIETY. DO NOT TAKE WITH LORAZEPAM . 270 tablet 1   diphenoxylate -atropine  (LOMOTIL ) 2.5-0.025 MG tablet Take 1 tablet by mouth 4 (four) times daily as needed for diarrhea or loose stools. 40 tablet 1   doxepin  (SINEQUAN ) 50 MG capsule Take 3 capsules (150 mg total) by mouth at bedtime. 90 capsule 3   furosemide  (LASIX ) 40 MG tablet Take 1 tablet (40 mg total) by mouth 2 (two) times daily. 180 tablet 3   levothyroxine  (SYNTHROID ) 100 MCG tablet Start Levothyroxine  100 mcg every other day and 50 mcg every other day. 90 tablet 3   LORazepam  (ATIVAN ) 0.5 MG tablet Take 1 tablet (0.5 mg total) by mouth at bedtime. 90 tablet 1   methylPREDNISolone  (MEDROL  DOSEPAK) 4 MG TBPK tablet As directed 21 tablet 0   metoprolol  tartrate (LOPRESSOR ) 50 MG tablet Take 1.5 tablets (75 mg total) by mouth 3 (three) times daily. 405 tablet 3   NIFEdipine  (PROCARDIA  XL) 60 MG 24 hr tablet Take 1 tablet (60 mg total) by mouth daily. 90 tablet 3   nitrofurantoin , macrocrystal-monohydrate, (MACROBID ) 100 MG capsule Take 1 capsule (100 mg total) by mouth 2 (two) times daily. 10 capsule 0   nitroGLYCERIN  (NITROLINGUAL ) 0.4 MG/SPRAY spray PLACE 1 SPRAY UNDER THE TONGUE EVERY 5 MINUTES AS NEEDED FOR CHEST PAIN. MAY REPEAT 3 TIMES  4.9 g 6   potassium chloride  SA (KLOR-CON  M) 20 MEQ tablet Take 1 tablet (20 mEq total) by mouth daily. 90 tablet 3   triamcinolone  ointment (KENALOG ) 0.1 % Apply 1 Application topically 4 (four) times daily as needed. On hemorrhoids 80 g 2   clobetasol  ointment (TEMOVATE ) 0.05 % Apply 1 application. topically 2 (two) times daily as needed. (Patient not taking: Reported on 03/12/2024) 30 g 3   estradiol  (ESTRACE ) 0.1 MG/GM vaginal cream Place 1 Applicatorful vaginally as needed (irritation). (Patient not taking: Reported on 03/12/2024) 42.5 g 3   estradiol  cypionate (DEPO-ESTRADIOL ) 5 MG/ML injection Use 0.3 ml every 18-25 days IM (Patient not taking: Reported on 03/12/2024) 5 mL 3   No facility-administered medications prior to visit.    ROS: Review of Systems  Constitutional:  Negative for activity change, appetite change, chills, fatigue and unexpected weight change.  HENT:  Negative for congestion, mouth sores and sinus pressure.   Eyes:  Negative for visual disturbance.  Respiratory:  Negative for cough, chest tightness and shortness of breath.   Cardiovascular:  Positive for leg swelling.  Gastrointestinal:  Negative for abdominal pain and nausea.  Genitourinary:  Negative for difficulty urinating, frequency and vaginal pain.  Musculoskeletal:  Positive for arthralgias and gait problem. Negative for back pain.  Skin:  Negative for pallor and  rash.  Neurological:  Negative for dizziness, tremors, weakness, numbness and headaches.  Hematological:  Does not bruise/bleed easily.  Psychiatric/Behavioral:  Positive for dysphoric mood. Negative for confusion, sleep disturbance and suicidal ideas. The patient is nervous/anxious.     Objective:  BP 102/62   Pulse 68   Ht 5' 6 (1.676 m)   Wt 158 lb 3.2 oz (71.8 kg)   SpO2 95%   BMI 25.53 kg/m   BP Readings from Last 3 Encounters:  03/12/24 102/62  02/13/24 (!) 166/80  01/13/24 136/78    Wt Readings from Last 3 Encounters:   03/12/24 158 lb 3.2 oz (71.8 kg)  02/13/24 156 lb 6.4 oz (70.9 kg)  01/13/24 156 lb (70.8 kg)    Physical Exam Constitutional:      General: She is not in acute distress.    Appearance: Normal appearance. She is well-developed.  HENT:     Head: Normocephalic.     Right Ear: External ear normal.     Left Ear: External ear normal.     Nose: Nose normal.  Eyes:     General:        Right eye: No discharge.        Left eye: No discharge.     Conjunctiva/sclera: Conjunctivae normal.     Pupils: Pupils are equal, round, and reactive to light.  Neck:     Thyroid : No thyromegaly.     Vascular: No JVD.     Trachea: No tracheal deviation.  Cardiovascular:     Rate and Rhythm: Normal rate and regular rhythm.     Heart sounds: Normal heart sounds.  Pulmonary:     Effort: No respiratory distress.     Breath sounds: No stridor. No wheezing.  Abdominal:     General: Bowel sounds are normal. There is no distension.     Palpations: Abdomen is soft. There is no mass.     Tenderness: There is no abdominal tenderness. There is no guarding or rebound.  Musculoskeletal:        General: No tenderness.     Cervical back: Normal range of motion and neck supple. No rigidity.     Right lower leg: Edema present.     Left lower leg: Edema present.  Lymphadenopathy:     Cervical: No cervical adenopathy.  Skin:    Findings: No erythema or rash.  Neurological:     Mental Status: She is oriented to person, place, and time.     Cranial Nerves: No cranial nerve deficit.     Motor: No abnormal muscle tone.     Coordination: Coordination normal.     Deep Tendon Reflexes: Reflexes normal.  Psychiatric:        Behavior: Behavior normal.        Thought Content: Thought content normal.        Judgment: Judgment normal.   Feet w/trace edema R>L R leg w/pain  Lab Results  Component Value Date   WBC 5.9 11/27/2023   HGB 12.6 11/27/2023   HCT 37.4 11/27/2023   PLT 258 11/27/2023   GLUCOSE 169 (H)  11/27/2023   CHOL 237 (H) 02/18/2023   TRIG 138.0 02/18/2023   HDL 66.00 02/18/2023   LDLDIRECT 188.2 01/10/2012   LDLCALC 143 (H) 02/18/2023   ALT 11 11/27/2023   AST 19 11/27/2023   NA 139 11/27/2023   K 4.1 11/27/2023   CL 103 11/27/2023   CREATININE 1.46 (H) 11/27/2023   BUN 17 11/27/2023  CO2 27 11/27/2023   TSH 1.22 02/18/2023   INR 1.0 08/01/2018    No results found.  Assessment & Plan:   Problem List Items Addressed This Visit     Anxiety disorder   Catalea decided not to undergo any breast cancer treatments offered to her...        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 No angina Cont on Toprol , Procardia       Depression   Cont w/Doxepin  at hs Discussed breast cancer      Edema   Better - use Compression sleeves for swelling      Relevant Orders   Comprehensive metabolic panel with GFR   Hot flash, menopausal - Primary   Relapsed bad today x1 (off HRT x 4 months) Will watch         No orders of the defined types were placed in this encounter.     Follow-up: Return in about 4 weeks (around 04/09/2024) for a follow-up visit.  Marolyn Noel, MD

## 2024-03-12 NOTE — Assessment & Plan Note (Signed)
 Cont w/Doxepin  at hs Discussed breast cancer

## 2024-03-12 NOTE — Assessment & Plan Note (Addendum)
 Relapsed bad today x1 (off HRT x 4 months) Will watch

## 2024-03-12 NOTE — Assessment & Plan Note (Signed)
Chronic. No angina Cont on Toprol, Procardia

## 2024-03-12 NOTE — Assessment & Plan Note (Signed)
Better - use Compression sleeves for swelling

## 2024-03-26 ENCOUNTER — Ambulatory Visit: Payer: Self-pay

## 2024-03-26 NOTE — Telephone Encounter (Signed)
 FYI Only or Action Required?: Action required by provider: Med Clarification .  Patient was last seen in primary care on 03/12/2024 by Plotnikov, Karlynn GAILS, MD.  Called Nurse Triage reporting Medication Question.  Symptoms began n/a.  Interventions attempted: Other: n/a.  Symptoms are: n/a.  Triage Disposition: No disposition on file.  Patient/caregiver understands and will follow disposition?: Yes Reason for Disposition  Health information question, no triage required and triager able to answer question  Answer Assessment - Initial Assessment Questions Wondering how many grams patient is supposed to insert. Applicator takes up to 4 grams. Advised Katrina PCP wrote 1 applicator full. Please advise.   1. REASON FOR CALL: What is the main reason for your call? or How can I best help you?     Katrina from [employer] looking for clarification on estradiol  (ESTRACE ) 0.01 % CREA vaginal cream.  Protocols used: Information Only Call - No Triage-A-AH  Copied from CRM [id number]. Topic: Clinical - Prescription Issue >> Mar 26, 2024 12:58 PM Rosina BIRCH wrote: Reason for CRM: woodie from [facility] called stating they need clarification on the estradiol  (ESTRACE ) 0.01 % CREA vaginal cream. They need to know how many grams she is supposed to put in the applicator because it is from one to four grams

## 2024-03-29 ENCOUNTER — Other Ambulatory Visit: Payer: Self-pay | Admitting: Internal Medicine

## 2024-03-29 NOTE — Telephone Encounter (Signed)
 I discontinued estrogen vaginal cream due to her diagnosis of breast cancer.  It is best just to use Aquaphor ointment.  Thanks

## 2024-03-30 NOTE — Telephone Encounter (Signed)
 Pt is no longer taking this medication.

## 2024-03-31 ENCOUNTER — Ambulatory Visit: Payer: Self-pay

## 2024-03-31 ENCOUNTER — Inpatient Hospital Stay (HOSPITAL_COMMUNITY)
Admission: EM | Admit: 2024-03-31 | Discharge: 2024-04-07 | DRG: 064 | Disposition: A | Discharge status: Skilled Nursing Facility | Attending: Emergency Medicine | Admitting: Emergency Medicine

## 2024-03-31 ENCOUNTER — Emergency Department (HOSPITAL_COMMUNITY)

## 2024-03-31 ENCOUNTER — Other Ambulatory Visit: Payer: Self-pay

## 2024-03-31 DIAGNOSIS — I63133 Cerebral infarction due to embolism of bilateral carotid arteries: Principal | ICD-10-CM | POA: Diagnosis present

## 2024-03-31 DIAGNOSIS — I2510 Atherosclerotic heart disease of native coronary artery without angina pectoris: Secondary | ICD-10-CM | POA: Diagnosis present

## 2024-03-31 DIAGNOSIS — Z79899 Other long term (current) drug therapy: Secondary | ICD-10-CM

## 2024-03-31 DIAGNOSIS — Z803 Family history of malignant neoplasm of breast: Secondary | ICD-10-CM

## 2024-03-31 DIAGNOSIS — Z170 Estrogen receptor positive status [ER+]: Secondary | ICD-10-CM

## 2024-03-31 DIAGNOSIS — S7001XA Contusion of right hip, initial encounter: Secondary | ICD-10-CM | POA: Diagnosis present

## 2024-03-31 DIAGNOSIS — Z8673 Personal history of transient ischemic attack (TIA), and cerebral infarction without residual deficits: Secondary | ICD-10-CM

## 2024-03-31 DIAGNOSIS — E876 Hypokalemia: Secondary | ICD-10-CM | POA: Diagnosis not present

## 2024-03-31 DIAGNOSIS — Z87448 Personal history of other diseases of urinary system: Secondary | ICD-10-CM | POA: Diagnosis present

## 2024-03-31 DIAGNOSIS — Z7409 Other reduced mobility: Secondary | ICD-10-CM | POA: Diagnosis present

## 2024-03-31 DIAGNOSIS — N390 Urinary tract infection, site not specified: Secondary | ICD-10-CM | POA: Diagnosis present

## 2024-03-31 DIAGNOSIS — Z5941 Food insecurity: Secondary | ICD-10-CM

## 2024-03-31 DIAGNOSIS — F32A Depression, unspecified: Secondary | ICD-10-CM | POA: Diagnosis present

## 2024-03-31 DIAGNOSIS — Z885 Allergy status to narcotic agent status: Secondary | ICD-10-CM

## 2024-03-31 DIAGNOSIS — R296 Repeated falls: Secondary | ICD-10-CM | POA: Diagnosis present

## 2024-03-31 DIAGNOSIS — Z7902 Long term (current) use of antithrombotics/antiplatelets: Secondary | ICD-10-CM

## 2024-03-31 DIAGNOSIS — E039 Hypothyroidism, unspecified: Secondary | ICD-10-CM | POA: Diagnosis present

## 2024-03-31 DIAGNOSIS — W19XXXA Unspecified fall, initial encounter: Secondary | ICD-10-CM | POA: Diagnosis present

## 2024-03-31 DIAGNOSIS — Z823 Family history of stroke: Secondary | ICD-10-CM

## 2024-03-31 DIAGNOSIS — Z8249 Family history of ischemic heart disease and other diseases of the circulatory system: Secondary | ICD-10-CM

## 2024-03-31 DIAGNOSIS — I6340 Cerebral infarction due to embolism of unspecified cerebral artery: Secondary | ICD-10-CM

## 2024-03-31 DIAGNOSIS — Z881 Allergy status to other antibiotic agents status: Secondary | ICD-10-CM

## 2024-03-31 DIAGNOSIS — Y92009 Unspecified place in unspecified non-institutional (private) residence as the place of occurrence of the external cause: Secondary | ICD-10-CM

## 2024-03-31 DIAGNOSIS — E785 Hyperlipidemia, unspecified: Secondary | ICD-10-CM | POA: Diagnosis present

## 2024-03-31 DIAGNOSIS — Z886 Allergy status to analgesic agent status: Secondary | ICD-10-CM

## 2024-03-31 DIAGNOSIS — Z79890 Hormone replacement therapy (postmenopausal): Secondary | ICD-10-CM

## 2024-03-31 DIAGNOSIS — N289 Disorder of kidney and ureter, unspecified: Secondary | ICD-10-CM | POA: Diagnosis present

## 2024-03-31 DIAGNOSIS — I639 Cerebral infarction, unspecified: Principal | ICD-10-CM | POA: Diagnosis present

## 2024-03-31 DIAGNOSIS — Z7982 Long term (current) use of aspirin: Secondary | ICD-10-CM

## 2024-03-31 DIAGNOSIS — Z87891 Personal history of nicotine dependence: Secondary | ICD-10-CM

## 2024-03-31 DIAGNOSIS — Z853 Personal history of malignant neoplasm of breast: Secondary | ICD-10-CM

## 2024-03-31 DIAGNOSIS — Z883 Allergy status to other anti-infective agents status: Secondary | ICD-10-CM

## 2024-03-31 DIAGNOSIS — I252 Old myocardial infarction: Secondary | ICD-10-CM

## 2024-03-31 DIAGNOSIS — N993 Prolapse of vaginal vault after hysterectomy: Secondary | ICD-10-CM | POA: Diagnosis present

## 2024-03-31 DIAGNOSIS — I10 Essential (primary) hypertension: Secondary | ICD-10-CM | POA: Diagnosis present

## 2024-03-31 DIAGNOSIS — Z880 Allergy status to penicillin: Secondary | ICD-10-CM

## 2024-03-31 DIAGNOSIS — I63421 Cerebral infarction due to embolism of right anterior cerebral artery: Secondary | ICD-10-CM | POA: Diagnosis present

## 2024-03-31 DIAGNOSIS — Z888 Allergy status to other drugs, medicaments and biological substances status: Secondary | ICD-10-CM

## 2024-03-31 LAB — URINALYSIS, ROUTINE W REFLEX MICROSCOPIC
Bilirubin Urine: NEGATIVE
Glucose, UA: NEGATIVE mg/dL
Ketones, ur: NEGATIVE mg/dL
Nitrite: POSITIVE — AB
Protein, ur: 30 mg/dL — AB
Specific Gravity, Urine: 1.009 (ref 1.005–1.030)
WBC, UA: >50 WBC/hpf (ref 0–5)
pH: 6 (ref 5.0–8.0)

## 2024-03-31 LAB — COMPREHENSIVE METABOLIC PANEL WITH GFR
ALT: 14 U/L (ref 0–44)
AST: 26 U/L (ref 15–41)
Albumin: 3.5 g/dL (ref 3.5–5.0)
Alkaline Phosphatase: 60 U/L (ref 38–126)
Anion gap: 12 (ref 5–15)
BUN: 19 mg/dL (ref 8–23)
CO2: 22 mmol/L (ref 22–32)
Calcium: 8.7 mg/dL — ABNORMAL LOW (ref 8.9–10.3)
Chloride: 102 mmol/L (ref 98–111)
Creatinine, Ser: 1.49 mg/dL — ABNORMAL HIGH (ref 0.44–1.00)
GFR, Estimated: 35 mL/min — ABNORMAL LOW (ref >60)
Glucose, Bld: 134 mg/dL — ABNORMAL HIGH (ref 70–99)
Potassium: 3.3 mmol/L — ABNORMAL LOW (ref 3.5–5.1)
Sodium: 136 mmol/L (ref 135–145)
Total Bilirubin: 0.7 mg/dL (ref 0.0–1.2)
Total Protein: 6.6 g/dL (ref 6.5–8.1)

## 2024-03-31 LAB — CBC
HCT: 36.6 % (ref 36.0–46.0)
Hemoglobin: 12 g/dL (ref 12.0–15.0)
MCH: 31.8 pg (ref 26.0–34.0)
MCHC: 32.8 g/dL (ref 30.0–36.0)
MCV: 97.1 fL (ref 80.0–100.0)
Platelets: 200 K/uL (ref 150–400)
RBC: 3.77 MIL/uL — ABNORMAL LOW (ref 3.87–5.11)
RDW: 12.4 % (ref 11.5–15.5)
WBC: 5.7 K/uL (ref 4.0–10.5)
nRBC: 0 % (ref 0.0–0.2)

## 2024-03-31 LAB — CBG MONITORING, ED: Glucose-Capillary: 132 mg/dL — ABNORMAL HIGH (ref 70–99)

## 2024-03-31 LAB — TSH: TSH: 0.719 u[IU]/mL (ref 0.350–4.500)

## 2024-03-31 LAB — T4, FREE: Free T4: 1.08 ng/dL (ref 0.61–1.12)

## 2024-03-31 NOTE — Telephone Encounter (Signed)
  FYI Only or Action Required?: FYI only for provider: ED advised.  Patient was last seen in primary care on 03/12/2024 by Plotnikov, Karlynn GAILS, MD.  Called Nurse Triage reporting Fall.  Symptoms began several days ago.  Interventions attempted: Rest, hydration, or home remedies.  Symptoms are: gradually worsening.  Triage Disposition: Go to ED Now (or PCP Triage)  Patient/caregiver understands and will follow disposition?: Yes Copied from CRM [id number]. Topic: Clinical - Red Word Triage >> Mar 31, 2024  1:24 PM Alexandria E wrote: [facility] that prompted transfer to Nurse Triage: Patient's friend, Jan, called in stating that patient had a fall today and yesterday, and now she feels a bit dizzy and light-headed, and her back hit the recliner. Patient and Jan are on the line. Reason for Disposition  Patient sounds very sick or weak to the triager  Answer Assessment - Initial Assessment Questions Patient to ED with Jan  1. MECHANISM: How did the fall happen?     Patient got up to walk to the living room this morning and realized that her legs were not stable 2. DOMESTIC VIOLENCE AND ELDER ABUSE SCREENING: Did you fall because someone pushed you or tried to hurt you? If Yes, ask: Are you safe now?     Patient not injured by anyone else, patient lives alone 3. ONSET: When did the fall happen? (e.g., minutes, hours, or days ago)     This morning, but had a fall a couple of days ago as well 4. LOCATION: What part of the body hit the ground? (e.g., back, buttocks, head, hips, knees, hands, head, stomach)     Hit her back on recliner when she fell 5. INJURY: Did you hurt (injure) yourself when you fell? If Yes, ask: What did you injure? Tell me more about this? (e.g., body area; type of injury; pain severity)     Denies injury 6. PAIN: Is there any pain? If Yes, ask: How bad is the pain? (e.g., Scale 0-10; or none, mild,      Denies pain 7. SIZE: For cuts, bruises, or  swelling, ask: How large is it? (e.g., inches or centimeters)      No broken skin 8. PREGNANCY: Is there any chance you are pregnant? When was your last menstrual period?     N/a 9. OTHER SYMPTOMS: Do you have any other symptoms? (e.g., dizziness, fever, weakness; new-onset or worsening).       10. CAUSE: What do you think caused the fall (or falling)? (e.g., dizzy spell, tripped)       Patient states that her legs felt unsteady, specifically right leg weakness.  Reports that she has had this leg weakness since January when she fell out of her own car.   Also states that she has had more falls recently  Protocols used: Falls and [facility]

## 2024-03-31 NOTE — ED Provider Triage Note (Signed)
 Emergency Medicine Provider Triage Evaluation Note  [name] , a 78 y.o. female  was evaluated in triage.  Pt complains of multiple falls recently and leg weakness. Most recent fall was this morning, denies blood thinning medication. States that her legs bilaterally feel weak when going to stand. Recently switched thyroid  brand medications. Did fall backwards with most recent fall this morning and may have hit head but unsure. Denies neck pain. Denies syncope, blurry vision, nausea, vomiting.  Review of Systems  Positive: Weakness, falls, head injury Negative: Fever, chills, chest pain, shortness of breath  Physical Exam  BP (!) 150/84 (BP Location: Right Arm)   Pulse 72   Temp 98.2 F (36.8 C)   Resp 16   Ht 5' 6 (1.676 m)   Wt 68 kg   SpO2 94%   BMI 24.21 kg/m  Gen:   Awake, no distress   Resp:  Normal effort  MSK:   Moves extremities without difficulty  Other:  No raccoon eyes, no battles sign, no cervical spine tenderness, normal neck ROM  Medical Decision Making  Medically screening exam initiated at 5:14 PM.  Appropriate orders placed.  VEDA ARRELLANO was informed that the remainder of the evaluation will be completed by another provider, this initial triage assessment does not replace that evaluation, and the importance of remaining in the ED until their evaluation is complete.  Orders: CBC, CMP, CBG, TSH, free T4, UA, CT head, EKG   Janetta Terrall FALCON, PA-C 03/31/24 1717

## 2024-03-31 NOTE — ED Triage Notes (Addendum)
 Dropped off. Here by POV from home for multiple recent falls and recent dizziness/ light headedness. Denies dizziness or pain. Mentions did hit back in fall. Endorses don't feel right. Verbalizes multiple frequent falls b/w Saturday and this am. Alert, NAD, calm, interactive, resps e/u, speaking in clear complete sentences. Skin W&D. Sitting in w/c. Complex significant hx noted. Takes lasix , synthroid , BP meds and multiple sedating meds. No blood thinners. Denies Head, neck or current back pain.

## 2024-04-01 ENCOUNTER — Encounter (HOSPITAL_COMMUNITY): Payer: Self-pay | Admitting: Internal Medicine

## 2024-04-01 ENCOUNTER — Emergency Department (HOSPITAL_COMMUNITY)

## 2024-04-01 DIAGNOSIS — Z885 Allergy status to narcotic agent status: Secondary | ICD-10-CM | POA: Diagnosis not present

## 2024-04-01 DIAGNOSIS — I63133 Cerebral infarction due to embolism of bilateral carotid arteries: Secondary | ICD-10-CM | POA: Diagnosis present

## 2024-04-01 DIAGNOSIS — Z79890 Hormone replacement therapy (postmenopausal): Secondary | ICD-10-CM | POA: Diagnosis not present

## 2024-04-01 DIAGNOSIS — Y92009 Unspecified place in unspecified non-institutional (private) residence as the place of occurrence of the external cause: Secondary | ICD-10-CM | POA: Diagnosis not present

## 2024-04-01 DIAGNOSIS — Z87448 Personal history of other diseases of urinary system: Secondary | ICD-10-CM | POA: Diagnosis present

## 2024-04-01 DIAGNOSIS — Z8249 Family history of ischemic heart disease and other diseases of the circulatory system: Secondary | ICD-10-CM | POA: Diagnosis not present

## 2024-04-01 DIAGNOSIS — F32A Depression, unspecified: Secondary | ICD-10-CM | POA: Diagnosis present

## 2024-04-01 DIAGNOSIS — E876 Hypokalemia: Secondary | ICD-10-CM | POA: Diagnosis present

## 2024-04-01 DIAGNOSIS — E785 Hyperlipidemia, unspecified: Secondary | ICD-10-CM | POA: Diagnosis present

## 2024-04-01 DIAGNOSIS — I252 Old myocardial infarction: Secondary | ICD-10-CM | POA: Diagnosis not present

## 2024-04-01 DIAGNOSIS — I639 Cerebral infarction, unspecified: Secondary | ICD-10-CM

## 2024-04-01 DIAGNOSIS — I739 Peripheral vascular disease, unspecified: Secondary | ICD-10-CM

## 2024-04-01 DIAGNOSIS — R296 Repeated falls: Secondary | ICD-10-CM | POA: Diagnosis present

## 2024-04-01 DIAGNOSIS — N3000 Acute cystitis without hematuria: Secondary | ICD-10-CM | POA: Diagnosis not present

## 2024-04-01 DIAGNOSIS — Z79899 Other long term (current) drug therapy: Secondary | ICD-10-CM | POA: Diagnosis not present

## 2024-04-01 DIAGNOSIS — Z880 Allergy status to penicillin: Secondary | ICD-10-CM | POA: Diagnosis not present

## 2024-04-01 DIAGNOSIS — I63421 Cerebral infarction due to embolism of right anterior cerebral artery: Secondary | ICD-10-CM | POA: Diagnosis present

## 2024-04-01 DIAGNOSIS — Z7902 Long term (current) use of antithrombotics/antiplatelets: Secondary | ICD-10-CM

## 2024-04-01 DIAGNOSIS — S7001XA Contusion of right hip, initial encounter: Secondary | ICD-10-CM | POA: Diagnosis not present

## 2024-04-01 DIAGNOSIS — I6340 Cerebral infarction due to embolism of unspecified cerebral artery: Secondary | ICD-10-CM

## 2024-04-01 DIAGNOSIS — Z87891 Personal history of nicotine dependence: Secondary | ICD-10-CM | POA: Diagnosis not present

## 2024-04-01 DIAGNOSIS — R29700 NIHSS score 0: Secondary | ICD-10-CM

## 2024-04-01 DIAGNOSIS — Z7982 Long term (current) use of aspirin: Secondary | ICD-10-CM

## 2024-04-01 DIAGNOSIS — I10 Essential (primary) hypertension: Secondary | ICD-10-CM | POA: Diagnosis present

## 2024-04-01 DIAGNOSIS — E039 Hypothyroidism, unspecified: Secondary | ICD-10-CM | POA: Diagnosis present

## 2024-04-01 DIAGNOSIS — N390 Urinary tract infection, site not specified: Secondary | ICD-10-CM | POA: Diagnosis present

## 2024-04-01 DIAGNOSIS — Z883 Allergy status to other anti-infective agents status: Secondary | ICD-10-CM | POA: Diagnosis not present

## 2024-04-01 DIAGNOSIS — Z886 Allergy status to analgesic agent status: Secondary | ICD-10-CM | POA: Diagnosis not present

## 2024-04-01 DIAGNOSIS — Z888 Allergy status to other drugs, medicaments and biological substances status: Secondary | ICD-10-CM | POA: Diagnosis not present

## 2024-04-01 DIAGNOSIS — E034 Atrophy of thyroid (acquired): Secondary | ICD-10-CM

## 2024-04-01 DIAGNOSIS — W19XXXA Unspecified fall, initial encounter: Secondary | ICD-10-CM | POA: Diagnosis present

## 2024-04-01 DIAGNOSIS — I2510 Atherosclerotic heart disease of native coronary artery without angina pectoris: Secondary | ICD-10-CM | POA: Diagnosis present

## 2024-04-01 DIAGNOSIS — I6389 Other cerebral infarction: Secondary | ICD-10-CM

## 2024-04-01 DIAGNOSIS — Z881 Allergy status to other antibiotic agents status: Secondary | ICD-10-CM | POA: Diagnosis not present

## 2024-04-01 DIAGNOSIS — Z170 Estrogen receptor positive status [ER+]: Secondary | ICD-10-CM | POA: Diagnosis not present

## 2024-04-01 LAB — CBC WITH DIFFERENTIAL/PLATELET
Abs Immature Granulocytes: 0.01 K/uL (ref 0.00–0.07)
Basophils Absolute: 0 K/uL (ref 0.0–0.1)
Basophils Relative: 0 %
Eosinophils Absolute: 0 K/uL (ref 0.0–0.5)
Eosinophils Relative: 1 %
HCT: 35.2 % — ABNORMAL LOW (ref 36.0–46.0)
Hemoglobin: 11.7 g/dL — ABNORMAL LOW (ref 12.0–15.0)
Immature Granulocytes: 0 %
Lymphocytes Relative: 28 %
Lymphs Abs: 1.4 K/uL (ref 0.7–4.0)
MCH: 32.1 pg (ref 26.0–34.0)
MCHC: 33.2 g/dL (ref 30.0–36.0)
MCV: 96.4 fL (ref 80.0–100.0)
Monocytes Absolute: 0.5 K/uL (ref 0.1–1.0)
Monocytes Relative: 9 %
Neutro Abs: 3.1 K/uL (ref 1.7–7.7)
Neutrophils Relative %: 62 %
Platelets: 191 K/uL (ref 150–400)
RBC: 3.65 MIL/uL — ABNORMAL LOW (ref 3.87–5.11)
RDW: 12.4 % (ref 11.5–15.5)
WBC: 5 K/uL (ref 4.0–10.5)
nRBC: 0 % (ref 0.0–0.2)

## 2024-04-01 LAB — RAPID URINE DRUG SCREEN, HOSP PERFORMED
Amphetamines: NOT DETECTED
Barbiturates: NOT DETECTED
Benzodiazepines: POSITIVE — AB
Cocaine: NOT DETECTED
Opiates: NOT DETECTED
Tetrahydrocannabinol: NOT DETECTED

## 2024-04-01 LAB — LIPID PANEL
Cholesterol: 208 mg/dL — ABNORMAL HIGH (ref 0–200)
HDL: 46 mg/dL (ref >40)
LDL Cholesterol: 136 mg/dL — ABNORMAL HIGH (ref 0–99)
Total CHOL/HDL Ratio: 4.5 ratio
Triglycerides: 130 mg/dL (ref <150)
VLDL: 26 mg/dL (ref 0–40)

## 2024-04-01 LAB — PROTIME-INR
INR: 1 (ref 0.8–1.2)
Prothrombin Time: 13.9 s (ref 11.4–15.2)

## 2024-04-01 LAB — ECHOCARDIOGRAM COMPLETE
Area-P 1/2: 2.08 cm2
Height: 66 in
S' Lateral: 2.4 cm
Weight: 2400 [oz_av]

## 2024-04-01 LAB — APTT: aPTT: 29 s (ref 24–36)

## 2024-04-01 LAB — ETHANOL: Alcohol, Ethyl (B): <15 mg/dL (ref <15)

## 2024-04-01 LAB — HEMOGLOBIN A1C
Hgb A1c MFr Bld: 5.3 % (ref 4.8–5.6)
Mean Plasma Glucose: 105.41 mg/dL

## 2024-04-01 MED ORDER — ENOXAPARIN SODIUM 30 MG/0.3ML IJ SOSY
30.0000 mg | PREFILLED_SYRINGE | INTRAMUSCULAR | Status: DC
  Administered 2024-04-01 – 2024-04-02 (×2): 30 mg via SUBCUTANEOUS
  Filled 2024-04-01 (×2): qty 0.3

## 2024-04-01 MED ORDER — CEPHALEXIN 250 MG PO CAPS
500.0000 mg | ORAL_CAPSULE | Freq: Once | ORAL | Status: AC
  Administered 2024-04-01: 500 mg via ORAL
  Filled 2024-04-01: qty 2

## 2024-04-01 MED ORDER — SODIUM CHLORIDE 0.9 % IV SOLN: INTRAVENOUS | Status: AC

## 2024-04-01 MED ORDER — CARMEX CLASSIC LIP BALM EX OINT
1.0000 | TOPICAL_OINTMENT | CUTANEOUS | Status: DC | PRN
  Filled 2024-04-01: qty 10

## 2024-04-01 MED ORDER — ACETAMINOPHEN 325 MG PO TABS
650.0000 mg | ORAL_TABLET | Freq: Four times a day (QID) | ORAL | Status: DC | PRN
  Administered 2024-04-01 – 2024-04-07 (×5): 650 mg via ORAL
  Filled 2024-04-01 (×5): qty 2

## 2024-04-01 MED ORDER — SENNOSIDES-DOCUSATE SODIUM 8.6-50 MG PO TABS: 1.0000 | ORAL_TABLET | Freq: Every evening | ORAL | Status: DC | PRN

## 2024-04-01 MED ORDER — CLOPIDOGREL BISULFATE 75 MG PO TABS
75.0000 mg | ORAL_TABLET | Freq: Every day | ORAL | Status: DC
  Administered 2024-04-01 – 2024-04-07 (×7): 75 mg via ORAL
  Filled 2024-04-01 (×7): qty 1

## 2024-04-01 MED ORDER — LORAZEPAM 1 MG PO TABS
1.0000 mg | ORAL_TABLET | Freq: Once | ORAL | Status: AC
  Administered 2024-04-01: 1 mg via ORAL
  Filled 2024-04-01: qty 1

## 2024-04-01 MED ORDER — ONDANSETRON 4 MG PO TBDP
4.0000 mg | ORAL_TABLET | Freq: Once | ORAL | Status: AC
  Administered 2024-04-01: 4 mg via ORAL
  Filled 2024-04-01: qty 1

## 2024-04-01 MED ORDER — SODIUM CHLORIDE 0.9 % IV SOLN
1.0000 g | INTRAVENOUS | Status: AC
  Administered 2024-04-01 – 2024-04-04 (×4): 1 g via INTRAVENOUS
  Filled 2024-04-01 (×4): qty 10

## 2024-04-01 MED ORDER — ASPIRIN 81 MG PO TBEC
81.0000 mg | DELAYED_RELEASE_TABLET | Freq: Every day | ORAL | Status: DC
  Administered 2024-04-01 – 2024-04-07 (×7): 81 mg via ORAL
  Filled 2024-04-01 (×7): qty 1

## 2024-04-01 MED ORDER — STROKE: EARLY STAGES OF RECOVERY BOOK
Freq: Once | Status: AC
  Filled 2024-04-01: qty 1

## 2024-04-01 MED ORDER — POTASSIUM CHLORIDE CRYS ER 20 MEQ PO TBCR
40.0000 meq | EXTENDED_RELEASE_TABLET | Freq: Once | ORAL | Status: AC
  Administered 2024-04-01: 40 meq via ORAL
  Filled 2024-04-01: qty 2

## 2024-04-01 NOTE — Evaluation (Signed)
 Physical Therapy Evaluation Patient Details Name: [name] MRN: [id number] DOB: [birth year] Today's Date: 04/01/2024  History of Present Illness  Pt is 78 year old presented to [facility] on  03/31/24 for multiple falls. 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 PMH - htn, hypothyroidism, depression  Clinical Impression  Pt admitted with above diagnosis and presents to PT with functional limitations due to deficits listed below (See PT problem list). Pt needs skilled PT to maximize independence and safety. Pt lives alone and until a few days ago was doing well. Driving, shopping, amb without assistive device. Multiple falls over several days. Patient will benefit from intensive inpatient follow-up therapy, >3 hours/day.           If plan is discharge home, recommend the following: A little help with walking and/or transfers;A little help with bathing/dressing/bathroom;Assist for transportation;Assistance with cooking/housework   Can travel by private [employer] (2 wheels)  Recommendations for Other Services       Functional Status Assessment Patient has had a recent decline in their functional status and demonstrates the ability to make significant improvements in function in a reasonable and predictable amount of time.     Precautions / Restrictions Precautions Precautions: Fall      Mobility  Bed Mobility Overal bed mobility: Needs Assistance Bed Mobility: Supine to Sit     Supine to sit: Contact guard     General bed mobility comments: Assist for safety    Transfers Overall transfer level: Needs assistance Equipment used: Rolling walker (2 wheels) Transfers: Sit to/from Stand, Bed to chair/wheelchair/BSC Sit to Stand: Min assist   Step pivot transfers: Min assist       General transfer comment: Assist to power up and balance. Cues for hand placement Bed to bsc to  recliner with walker and assist for balance and support    Ambulation/Gait Ambulation/Gait assistance: Min assist Gait Distance (Feet): 15 Feet Assistive device: Rolling walker (2 wheels) Gait Pattern/deviations: Step-through pattern, Decreased step length - right, Decreased step length - left, Trunk flexed, Narrow base of support Gait velocity: decr Gait velocity interpretation: <1.31 ft/sec, indicative of household ambulator   General Gait Details: Assist for balance and support. When standing to amb LE's tremulous. Returned to sitting. Stood again and legs more stable.  Stairs            Wheelchair Mobility     Tilt Bed    Modified Rankin (Stroke Patients Only) Modified Rankin (Stroke Patients Only) Pre-Morbid Rankin Score: No significant disability Modified Rankin: Moderately severe disability     Balance Overall balance assessment: Needs assistance Sitting-balance support: No upper extremity supported, Feet supported Sitting balance-Leahy Scale: Good     Standing balance support: Bilateral upper extremity supported, During functional activity, Reliant on assistive device for balance Standing balance-Leahy Scale: Poor Standing balance comment: walker and min assist for static standing                             Pertinent Vitals/Pain Pain Assessment Pain Assessment: No/denies pain    Home Living Family/patient expects to be discharged to:: Private residence Living Arrangements: Alone   Type of Home: Apartment Home Access: Level entry       Home Layout: One level Home Equipment: Cane - single point;Shower seat;Toilet riser      Prior Function Prior Level of  Function : Independent/Modified Independent;Driving             Mobility Comments: No assistive device ADLs Comments: Independent     Extremity/Trunk Assessment   Upper Extremity Assessment Upper Extremity Assessment: Defer to OT evaluation    Lower Extremity  Assessment Lower Extremity Assessment: Generalized weakness       Communication   Communication Communication: Impaired;Other (comment) (tangential) Factors Affecting Communication: Hearing impaired    Cognition Arousal: Alert Behavior During Therapy: WFL for tasks assessed/performed   PT - Cognitive impairments: No family/caregiver present to determine baseline, Memory, Other                       PT - Cognition Comments: tangential and rambling Following commands: Impaired Following commands impaired: Follows multi-step commands with increased time     Cueing       General Comments General comments (skin integrity, edema, etc.): VSS on RA. Was attempting orthostatics but unable due to urgent need to urinate.    Exercises     Assessment/Plan    PT Assessment Patient needs continued PT services  PT Problem List Decreased strength;Decreased activity tolerance;Decreased balance;Decreased mobility;Decreased knowledge of use of DME       PT Treatment Interventions DME instruction;Gait training;Functional mobility training;Therapeutic activities;Therapeutic exercise;Balance training;Patient/family education    PT Goals (Current goals can be found in the Care Plan section)  Acute Rehab PT Goals Patient Stated Goal: return home to dog PT Goal Formulation: With patient Time For Goal Achievement: 04/15/24 Potential to Achieve Goals: Good    Frequency Min 3X/week     Co-evaluation               AM-PAC PT 6 Clicks Mobility  Outcome Measure Help needed turning from your back to your side while in a flat bed without using bedrails?: A Little Help needed moving from lying on your back to sitting on the side of a flat bed without using bedrails?: A Little Help needed moving to and from a bed to a chair (including a wheelchair)?: A Little Help needed standing up from a chair using your arms (e.g., wheelchair or bedside chair)?: A Little Help needed to walk  in hospital room?: Total Help needed climbing 3-5 steps with a railing? : Total 6 Click Score: 14    End of Session Equipment Utilized During Treatment: Gait belt Activity Tolerance: Patient tolerated treatment well Patient left: in chair;with call bell/phone within reach;with chair alarm set Nurse Communication: Mobility status PT Visit Diagnosis: Unsteadiness on feet (R26.81);Other abnormalities of gait and mobility (R26.89);Repeated falls (R29.6);History of falling (Z91.81);Muscle weakness (generalized) (M62.81)    Time: 1550-1630 PT Time Calculation (min) (ACUTE ONLY): 40 min   Charges:   PT Evaluation $PT Eval Moderate Complexity: 1 Mod PT Treatments $Gait Training: 23-37 mins PT General Charges $$ ACUTE PT VISIT: 1 Visit         [facility] PT Acute Rehabilitation Services Office [phone]   Rodgers ORN [facility] 04/01/2024, 4:46 PM

## 2024-04-01 NOTE — Progress Notes (Signed)
 Echocardiogram 2D Echocardiogram has been performed.  [name] 04/01/2024, 3:52 PM

## 2024-04-01 NOTE — Consult Note (Addendum)
 NEUROLOGY CONSULT NOTE   Date of service: April 01, 2024 Patient Name: [name] MRN:  [id number] DOB:  05/03/1946 Chief Complaint: Falls with no objective weakness Requesting Provider: Raford Lenis, MD  History of Present Illness  [name] is a 78 y.o. female with hx of depression, HTN, hLD, hypothyroidism who presents after multiple falls.  She endorses about 3 falls over the last 3 to 4 days.  The first fall happened when she was trying to get up from her bed and she fell backward and hit her head.  The next while she was slouching down to put the leash on her dog and again felt backwards when she got up.  She had workup in the ED with MRI Brain which demonstrated multiple small acute infarcts in R frontal and R parietal lobes along with additional small stroke in the L occipital lobe amd R cerebellar hemisphere.  Neurology consulted for further evaluation and workup.  She endorses a prior history of stroke with no residual symptoms.  She endorses her mother had a stroke.  She also endorses that she has narrowing of a blood vessel in her neck.  She quit smoking in 1993.  She does not use any recreational substances.  She denies any history of atrial fibrillation.  She reports she is very afraid of walking now with the 3 falls over the last 4 days.  LKW: Unclear as she does not have any symptoms at this time. Modified rankin score: 0-Completely asymptomatic and back to baseline post- stroke IV Thrombolysis: Not offered, no symptoms at this time.   EVT: Not offered, low suspicion for an LVO and no symptoms at this time.    NIHSS components Score: Comment  1a Level of Conscious 0[]  1[]  2[]  3[]      1b LOC Questions 0[]  1[]  2[]       1c LOC Commands 0[]  1[]  2[]       2 Best Gaze 0[]  1[]  2[]       3 Visual 0[]  1[]  2[]  3[]      4 Facial Palsy 0[]  1[]  2[]  3[]      5a Motor Arm - left 0[]  1[]  2[]  3[]  4[]  UN[]    5b Motor Arm - Right 0[]  1[]  2[]  3[]  4[]  UN[]    6a Motor Leg - Left 0[]   1[]  2[]  3[]  4[]  UN[]    6b Motor Leg - Right 0[]  1[]  2[]  3[]  4[]  UN[]    7 Limb Ataxia 0[]  1[]  2[]  UN[]      8 Sensory 0[]  1[]  2[]  UN[]      9 Best Language 0[]  1[]  2[]  3[]      10 Dysarthria 0[]  1[]  2[]  UN[]      11 Extinct. and Inattention 0[]  1[]  2[]       TOTAL: 0      ROS  Comprehensive ROS performed and pertinent positives documented in HPI   Past History   Past Medical History:  Diagnosis Date   Anxiety state, unspecified    Bronchitis, acute    Carotid bruit    Coronary atherosclerosis of unspecified type of vessel, native or graft    multiple angioplasy procedures in 90's/early 2000's   De Quervain's tenosynovitis    Depressive disorder, not elsewhere classified    Female bladder prolapse    Lichen 2011   Vaginal   Other and unspecified hyperlipidemia    Unspecified essential hypertension    Unspecified hypothyroidism     Past Surgical History:  Procedure Laterality Date   MOUTH SURGERY  NOSE SURGERY  1999   TOTAL VAGINAL HYSTERECTOMY  1994    Family History: Family History  Problem Relation Age of Onset   Hypertension Mother    Hypertension Father    Breast cancer Sister        dx. at unknown age   Hypertension Other    Coronary artery disease Other     Social History  reports that she quit smoking about 32 years ago. Her smoking use included cigarettes. She has never used smokeless tobacco. She reports that she does not drink alcohol and does not use drugs.  Allergies  Allergen Reactions   Epinephrine  Other (See Comments)    unknown   Isosorbide Mononitrate Other (See Comments)    Severe headache   Lidocaine  Nausea And Vomiting   Morphine Other (See Comments)    unknown   Propoxyphene N-Acetaminophen Other (See Comments)    unknown   Alprazolam Other (See Comments)    REACTION: very sedating   Atorvastatin Other (See Comments)    REACTION: weakness   Codeine Nausea And Vomiting   Ezetimibe Other (See Comments)    REACTION: myalgia    Prilosec [Omeprazole ] Diarrhea and Other (See Comments)    Chest discomfort   Rosuvastatin Other (See Comments)    REACTION: leg weakness   Simvastatin Other (See Comments)    REACTION: leg  weakness   Allopurinol      ?bruising per pt   Clonidine  Derivatives     whoozy   Meloxicam      diarrhea   Metronidazole      Abd cramps   Tetracycline Nausea And Vomiting   Triple Antibiotic [Bacitracin-Neomycin-Polymyxin]     rash   Amoxicillin  Nausea And Vomiting    Can take Ampicillin    Ciprofloxacin Nausea And Vomiting   Diphenhydramine Hcl Anxiety    Medications  No current facility-administered medications for this encounter.  Current Outpatient Medications:    ampicillin  (PRINCIPEN) 500 MG capsule, Take 1 capsule (500 mg total) by mouth 4 (four) times daily., Disp: 28 capsule, Rfl: 1   aspirin  EC 81 MG tablet, Take 1 tablet (81 mg total) by mouth 2 (two) times daily., Disp: , Rfl:    Cholecalciferol 1000 UNITS tablet, Take 1,000 Units by mouth daily., Disp: , Rfl:    clobetasol  ointment (TEMOVATE ) 0.05 %, Apply 1 Application topically 2 (two) times daily as needed., Disp: 30 g, Rfl: 3   clorazepate  (TRANXENE ) 15 MG tablet, TAKE 1 TABLET BY MOUTH THREE TIMES DAILY AS NEEDED FOR ANXIETY. DO NOT TAKE WITH LORAZEPAM ., Disp: 270 tablet, Rfl: 1   diphenoxylate -atropine  (LOMOTIL ) 2.5-0.025 MG tablet, Take 1 tablet by mouth 4 (four) times daily as needed for diarrhea or loose stools., Disp: 40 tablet, Rfl: 1   doxepin  (SINEQUAN ) 50 MG capsule, Take 3 capsules (150 mg total) by mouth at bedtime., Disp: 90 capsule, Rfl: 3   furosemide  (LASIX ) 40 MG tablet, Take 1 tablet (40 mg total) by mouth 2 (two) times daily., Disp: 180 tablet, Rfl: 3   levothyroxine  (SYNTHROID ) 100 MCG tablet, Start Levothyroxine  100 mcg every other day and 50 mcg every other day., Disp: 90 tablet, Rfl: 3   LORazepam  (ATIVAN ) 0.5 MG tablet, Take 1 tablet (0.5 mg total) by mouth at bedtime., Disp: 90 tablet, Rfl: 1    methylPREDNISolone  (MEDROL  DOSEPAK) 4 MG TBPK tablet, As directed, Disp: 21 tablet, Rfl: 0   metoprolol  tartrate (LOPRESSOR ) 50 MG tablet, Take 1.5 tablets (75 mg total) by mouth 3 (three) times daily., Disp:  405 tablet, Rfl: 3   NIFEdipine  (PROCARDIA  XL) 60 MG 24 hr tablet, Take 1 tablet (60 mg total) by mouth daily., Disp: 90 tablet, Rfl: 3   nitrofurantoin , macrocrystal-monohydrate, (MACROBID ) 100 MG capsule, Take 1 capsule (100 mg total) by mouth 2 (two) times daily., Disp: 10 capsule, Rfl: 0   nitroGLYCERIN  (NITROLINGUAL ) 0.4 MG/SPRAY spray, PLACE 1 SPRAY UNDER THE TONGUE EVERY 5 MINUTES AS NEEDED FOR CHEST PAIN. MAY REPEAT 3 TIMES, Disp: 4.9 g, Rfl: 6   potassium chloride  SA (KLOR-CON  M) 20 MEQ tablet, Take 1 tablet (20 mEq total) by mouth daily., Disp: 90 tablet, Rfl: 3   triamcinolone  ointment (KENALOG ) 0.1 %, Apply 1 Application topically 4 (four) times daily as needed. On hemorrhoids, Disp: 80 g, Rfl: 2  Vitals   Vitals:   03/31/24 1633 03/31/24 2131 04/01/24 0044 04/01/24 0354  BP:  (!) 165/84 (!) 158/90 (!) 146/92  Pulse:  79 86 93  Resp:  18 20 17   Temp:  98 F (36.7 C)  97.7 F (36.5 C)  TempSrc:    Oral  SpO2:  97% 100% 100%  Weight: 68 kg     Height: 5' 6 (1.676 m)       Body mass index is 24.21 kg/m.   Physical Exam   General: Laying comfortably in bed; in no acute distress.  HENT: Normal oropharynx and mucosa. Normal external appearance of ears and nose.  Neck: Supple, no pain or tenderness  CV: No JVD. No peripheral edema.  Pulmonary: Symmetric Chest rise. Normal respiratory effort.  Abdomen: Soft to touch, non-tender.  Ext: No cyanosis, edema, or deformity  Skin: No rash. Normal palpation of skin.   Musculoskeletal: Normal digits and nails by inspection. No clubbing.   Neurologic Examination  Mental status/Cognition: Alert, oriented to self, place, month and year, good attention.  Speech/language: Fluent, comprehension intact, object naming intact,  repetition intact.  Cranial nerves:   CN II Pupils equal and reactive to light, no VF deficits but endorses decreased visual acuity left eye that is chronic and endorses that is secondary to macular degeneration.   CN III,IV,VI EOM intact, no gaze preference or deviation, no nystagmus    CN V normal sensation in V1, V2, and V3 segments bilaterally    CN VII no asymmetry, no nasolabial fold flattening    CN VIII normal hearing to speech    CN IX & X normal palatal elevation, no uvular deviation    CN XI 5/5 head turn and 5/5 shoulder shrug bilaterally    CN XII midline tongue protrusion    Motor:  Muscle bulk: normal, tone normal, pronator drift none tremor none Mvmt Root Nerve  Muscle Right Left Comments  SA C5/6 Ax Deltoid 5 5   EF C5/6 Mc Biceps 5 5   EE C6/7/8 Rad Triceps 5 5   WF C6/7 Med FCR     WE C7/8 PIN ECU     F Ab C8/T1 U ADM/FDI 5 5   HF L1/2/3 Fem Illopsoas 5 5   KE L2/3/4 Fem Quad 5 5   DF L4/5 D Peron Tib Ant 5 5   PF S1/2 Tibial Grc/Sol 5 5    Sensation:  Light touch Intact throughout   Pin prick    Temperature    Vibration   Proprioception    Coordination/Complex Motor:  - Finger to Nose intact bilaterally - Heel to shin and tact bilaterally - Rapid alternating movement are normal - Gait: Deferred given patient  hesitancy. Labs/Imaging/Neurodiagnostic studies   CBC:  Recent Labs  Lab 2024-04-24 1743  WBC 5.7  HGB 12.0  HCT 36.6  MCV 97.1  PLT 200   Basic Metabolic Panel:  Lab Results  Component Value Date   NA 136 24-Apr-2024   K 3.3 (L) 04-24-2024   CO2 22 04/24/2024   GLUCOSE 134 (H) 04-24-24   BUN 19 Apr 24, 2024   CREATININE 1.49 (H) Apr 24, 2024   CALCIUM 8.7 (L) 04/24/2024   GFRNONAA 35 (L) April 24, 2024   GFRAA  08/23/2009    >60        The eGFR has been calculated using the MDRD equation. This calculation has not been validated in all clinical situations. eGFR's persistently <60 mL/min signify possible Chronic Kidney Disease.    Lipid Panel:  Lab Results  Component Value Date   LDLCALC 143 (H) 02/18/2023   HgbA1c: No results found for: HGBA1C Urine Drug Screen: No results found for: LABOPIA, COCAINSCRNUR, LABBENZ, AMPHETMU, THCU, LABBARB  Alcohol Level No results found for: [facility] INR  Lab Results  Component Value Date   INR 1.0 08/01/2018   APTT No results found for: APTT AED levels: No results found for: PHENYTOIN, ZONISAMIDE, LAMOTRIGINE, LEVETIRACETA  CT Head without contrast(Personally reviewed): CTH was negative for a large hypodensity concerning for a large territory infarct or hyperdensity concerning for an ICH  CT angio Head and Neck with contrast: Pending  MRI Brain(Personally reviewed): 1. Numerous foci of restricted diffusion in the subcortical and periventricular white matter of the right frontal and parietal lobes, consistent with acute infarcts. 2. Additional punctate foci of probable restricted diffusion in the left occipital lobe and posteromedially within the right cerebellar hemisphere. 3. No evidence of hemorrhage.  ASSESSMENT   [name] is a 78 y.o. female with hx of depression, HTN, hLD, hypothyroidism who presents after multiple falls.  Falls seem to occur when she is rising up or when she stands up from a sitting positiion. She falls backwards and hit her head once.  MRI shows embolic appearing stroke in bilateral carotid distribution with right worse than left. The strokes are all sub-centimeter and unlikely to explain her falls. She has no focal deficits.  RECOMMENDATIONS  - Frequent Neuro checks per stroke unit protocol - Recommend brain imaging with MRI Brain without contrast - Recommend Vascular imaging with CT angio of the head and neck - Recommend obtaining TTE - Recommend obtaining Lipid panel with LDL - Consider starting PCSK9 inhibitors if LDL > 70.  She endorses significant generalized weakness and unable to walk with statins in the  past. - Recommend HbA1c to evaluate for diabetes and how well it is controlled. - Antithrombotic -aspirin  81 mg daily. - Recommend DVT ppx - SBP goal - permissive hypertension first 24 h < 220/110. Held home meds.  - Recommend Telemetry monitoring for arrythmia - Recommend bedside swallow screen prior to PO intake. - Stroke education booklet - Recommend PT/OT/SLP consult  - Recommend orthostatic vitals, TSH, cortisol levels.  ______________________________________________________________________  I personally spent a total of 75 minutes in the care of the patient today including preparing to see the patient, getting/reviewing separately obtained history, performing a medically appropriate exam/evaluation, counseling and educating, placing orders, referring and communicating with other health care professionals, documenting clinical information in the EHR, independently interpreting results, communicating results, and coordinating care.  Plan discussed with patient and with Dr. Raford with the ED team.  Signed, Ghislaine Harcum, MD [facility]

## 2024-04-01 NOTE — Progress Notes (Signed)
 BLE venous duplex has been completed.   Results can be found under chart review under CV PROC. 04/01/2024 1:28 PM Amari Zagal RVT, RDMS

## 2024-04-01 NOTE — Progress Notes (Signed)
  Inpatient Rehab Admissions Coordinator :  Per therapy recommendations, patient was screened for CIR candidacy by Ottie Glazier RN MSN.  At this time patient appears to be a potential candidate for CIR. I will place a rehab consult per protocol for full assessment. Please call me with any questions.  Ottie Glazier RN MSN Admissions Coordinator [phone]

## 2024-04-01 NOTE — Progress Notes (Addendum)
 STROKE TEAM PROGRESS NOTE    INTERIM HISTORY/SUBJECTIVE  Patient has remained hemodynamically stable and afebrile overnight.  She requires evaluation by PT/OT.  She does endorse some food insecurity, and TOC consult was placed to assist with this.  OBJECTIVE  CBC    Component Value Date/Time   WBC 5.0 04/01/2024 0634   RBC 3.65 (L) 04/01/2024 0634   HGB 11.7 (L) 04/01/2024 0634   HGB 12.6 11/27/2023 1223   HCT 35.2 (L) 04/01/2024 0634   PLT 191 04/01/2024 0634   PLT 258 11/27/2023 1223   MCV 96.4 04/01/2024 0634   MCH 32.1 04/01/2024 0634   MCHC 33.2 04/01/2024 0634   RDW 12.4 04/01/2024 0634   LYMPHSABS 1.4 04/01/2024 0634   MONOABS 0.5 04/01/2024 0634   EOSABS 0.0 04/01/2024 0634   BASOSABS 0.0 04/01/2024 0634    BMET    Component Value Date/Time   NA 136 03/31/2024 1743   NA 144 03/13/2022 1116   K 3.3 (L) 03/31/2024 1743   CL 102 03/31/2024 1743   CO2 22 03/31/2024 1743   GLUCOSE 134 (H) 03/31/2024 1743   BUN 19 03/31/2024 1743   BUN 15 03/13/2022 1116   CREATININE 1.49 (H) 03/31/2024 1743   CREATININE 1.46 (H) 11/27/2023 1223   CREATININE 1.20 (H) 12/29/2019 1012   CALCIUM 8.7 (L) 03/31/2024 1743   EGFR 46 (L) 03/13/2022 1116   GFRNONAA 35 (L) 03/31/2024 1743   GFRNONAA 36 (L) 11/27/2023 1223    IMAGING past 24 hours VAS US  LOWER EXTREMITY VENOUS (DVT) Result Date: 04/01/2024  Lower Venous DVT Study Patient Name:  [name] [name]  Date of Exam:   04/01/2024 Medical Rec #: [id number]     Accession #:    [id number] Date of Birth: 04-03-1946      Patient Gender: F Patient Age:   78 years Exam Location:  [facility] Procedure:      VAS US  LOWER EXTREMITY VENOUS (DVT) Referring Phys: ARY Arabelle Bollig --------------------------------------------------------------------------------  Indications: Stroke.  Comparison Study: Previous exam on 08/30/2023 (RLEV) was negative for DVT Performing Technologist: Ezzie Potters RVT, RDMS  Examination Guidelines: A complete evaluation  includes B-mode imaging, spectral Doppler, color Doppler, and power Doppler as needed of all accessible portions of each vessel. Bilateral testing is considered an integral part of a complete examination. Limited examinations for reoccurring indications may be performed as noted. The reflux portion of the exam is performed with the patient in reverse Trendelenburg.  +---------+---------------+---------+-----------+----------+--------------+ RIGHT    CompressibilityPhasicitySpontaneityPropertiesThrombus Aging +---------+---------------+---------+-----------+----------+--------------+ CFV      Full           Yes      Yes                                 +---------+---------------+---------+-----------+----------+--------------+ SFJ      Full                                                        +---------+---------------+---------+-----------+----------+--------------+ FV Prox  Full           Yes      Yes                                 +---------+---------------+---------+-----------+----------+--------------+  FV Mid   Full           Yes      Yes                                 +---------+---------------+---------+-----------+----------+--------------+ FV DistalFull           Yes      Yes                                 +---------+---------------+---------+-----------+----------+--------------+ PFV      Full                                                        +---------+---------------+---------+-----------+----------+--------------+ POP      Full           Yes      Yes                                 +---------+---------------+---------+-----------+----------+--------------+ PTV      Full                                                        +---------+---------------+---------+-----------+----------+--------------+ PERO     Full                                                         +---------+---------------+---------+-----------+----------+--------------+   +---------+---------------+---------+-----------+----------+--------------+ LEFT     CompressibilityPhasicitySpontaneityPropertiesThrombus Aging +---------+---------------+---------+-----------+----------+--------------+ CFV      Full           Yes      Yes                                 +---------+---------------+---------+-----------+----------+--------------+ SFJ      Full                                                        +---------+---------------+---------+-----------+----------+--------------+ FV Prox  Full           Yes      Yes                                 +---------+---------------+---------+-----------+----------+--------------+ FV Mid   Full           Yes      Yes                                 +---------+---------------+---------+-----------+----------+--------------+ FV DistalFull  Yes      Yes                                 +---------+---------------+---------+-----------+----------+--------------+ PFV      Full                                                        +---------+---------------+---------+-----------+----------+--------------+ POP      Full           Yes      Yes                                 +---------+---------------+---------+-----------+----------+--------------+ PTV      Full                                                        +---------+---------------+---------+-----------+----------+--------------+ PERO     Full                                                        +---------+---------------+---------+-----------+----------+--------------+     Summary: BILATERAL: - No evidence of deep vein thrombosis seen in the lower extremities, bilaterally. -No evidence of popliteal cyst, bilaterally.   *See table(s) above for measurements and observations.    Preliminary    MR BRAIN WO CONTRAST Result Date:  04/01/2024 EXAM: MRI BRAIN WITHOUT CONTRAST 04/01/2024 05:19:14 AM TECHNIQUE: Multiplanar multisequence MRI of the head/brain was performed without the administration of intravenous contrast. COMPARISON: CT of the head dated 03/31/2024. CLINICAL HISTORY: Neuro deficit, acute, stroke suspected. FINDINGS: BRAIN AND VENTRICLES: There are numerous foci of restricted diffusion present within the subcortical and periventricular white matter of the right frontal and parietal lobes. There are also a few punctate foci of probable restricted diffusion in the left occipital lobe and posteromedially within the right cerebellar hemisphere. There is age-related cerebral volume loss. There are confluence zones of increased T2 signal in the periventricular and deep cerebral white matter. No intracranial hemorrhage. No mass. No midline shift. No hydrocephalus. The sella is unremarkable. Normal flow voids. ORBITS: The patient is status post bilateral lens replacement. SINUSES AND MASTOIDS: There is moderate mucosal disease within the ethmoid and sphenoid sinuses. BONES AND SOFT TISSUES: Normal marrow signal. No acute soft tissue abnormality. IMPRESSION: 1. Numerous foci of restricted diffusion in the subcortical and periventricular white matter of the right frontal and parietal lobes, consistent with acute infarcts. 2. Additional punctate foci of probable restricted diffusion in the left occipital lobe and posteromedially within the right cerebellar hemisphere. 3. No evidence of hemorrhage. Electronically signed by: Evalene Coho MD 04/01/2024 05:42 AM EST RP Workstation: [id number]   DG Knee Complete 4 Views Right Result Date: 04/01/2024 EXAM: 4 VIEW(S) XRAY OF THE RIGHT KNEE 04/01/2024 02:34:46 AM COMPARISON: None available. CLINICAL HISTORY: fall FINDINGS: BONES AND JOINTS: No acute fracture. No focal osseous lesion. No joint dislocation. Small  suprapatellar knee joint effusion. Mild tricompartmental degenerative changes,  most prominent in the lateral and patellofemoral compartments. SOFT TISSUES: The soft tissues are unremarkable. IMPRESSION: 1. No acute fracture or dislocation. 2. Mild degenerative changes. 3. Small suprapatellar knee joint effusion. Electronically signed by: Pinkie Pebbles MD 04/01/2024 02:38 AM EST RP Workstation: [id number]   DG Hip Unilat W or Wo Pelvis 2-3 Views Right Result Date: 04/01/2024 EXAM: 3 VIEW(S) XRAY OF THE RIGHT HIP 04/01/2024 02:34:46 AM COMPARISON: None available. CLINICAL HISTORY: fall FINDINGS: BONES AND JOINTS: No acute fracture or focal osseous lesion. The hip joint is maintained. Mild degenerative changes of the right hip. SOFT TISSUES: The soft tissues are unremarkable. IMPRESSION: 1. No acute fracture or dislocation. Electronically signed by: Pinkie Pebbles MD 04/01/2024 02:38 AM EST RP Workstation: [id number]   CT Head Wo Contrast Result Date: 03/31/2024 EXAM: CT HEAD WITHOUT CONTRAST 03/31/2024 08:31:01 PM TECHNIQUE: CT of the head was performed without the administration of intravenous contrast. Automated exposure control, iterative reconstruction, and/or weight based adjustment of the mA/kV was utilized to reduce the radiation dose to as low as reasonably achievable. COMPARISON: 03/15/2023 CLINICAL HISTORY: fall, hit back of head FINDINGS: BRAIN AND VENTRICLES: No acute hemorrhage. No evidence of acute infarct. No extra-axial collection. No mass effect or midline shift. Moderate patchy supratentorial white matter hypodensities. Mild global atrophy. ORBITS: No acute abnormality. SINUSES: Chronic opacification of left sphenoid sinus. Mild paranasal sinus mucosal thickening. SOFT TISSUES AND SKULL: No acute soft tissue abnormality. No skull fracture. IMPRESSION: 1. No acute intracranial abnormality related to the fall. 2. Moderate supratentorial white matter hypodensities, likely chronic microvascular ischemic changes, and mild global atrophy. Electronically signed by: Oneil Devonshire MD 03/31/2024 08:33 PM EST RP Workstation: [id number]    Vitals:   04/01/24 9250 04/01/24 1037 04/01/24 1137 04/01/24 1155  BP: (!) 171/90  (!) 169/77   Pulse: (!) 103  97   Resp: 18  18   Temp: 97.9 F (36.6 C)   98.1 F (36.7 C)  TempSrc:    Oral  SpO2: 100% 100% 99%   Weight:      Height:         PHYSICAL EXAM General:  Alert, well-nourished, well-developed elderly patient in no acute distress Psych:  Mood and affect appropriate for situation CV: Regular rate and rhythm on monitor Respiratory:  Regular, unlabored respirations on room air   NEURO:  Mental Status: AA&Ox3, patient is able to give clear and coherent history Speech/Language: speech is without dysarthria or aphasia.  Fluency, and comprehension intact.  Cranial Nerves:  II: PERRL. Visual fields full.  III, IV, VI: EOMI. Eyelids elevate symmetrically.  VII: Face is symmetrical resting and smiling VIII: hearing intact to voice. IX, X: Phonation is normal.  KP:Dynloizm shrug 5/5. XII: tongue is midline without fasciculations. Motor: 5/5 strength to all muscle groups tested.  Tone: is normal and bulk is normal Sensation- Intact to light touch bilaterally.  Coordination: FTN intact bilaterally Gait- deferred    ASSESSMENT/PLAN  Ms. [name] is a 78 y.o. female with history of hypothyroidism, hypertension, hyperlipidemia and depression admitted after experiencing multiple falls at home.  Patient was found to have embolic appearing strokes on MRI which are very small and unlikely to explain her falls.  NIH on Admission 0  Stroke: Multiple acute ischemic infarcts, etiology: Likely embolic, concerning for occult afib CT head No acute abnormality. Small vessel disease. Atrophy.  CTA head & neck pending MRI numerous foci of restricted diffusion and  subcortical and periventricular white matter right frontal and parietal lobes, also in left occipital lobe and right cerebellar hemisphere 2D Echo  pending Bilateral lower extremity no DVT Recommend loop recorder or 30-day monitor at discharge, however, pt concerns for cost LDL 136 HgbA1c 5.3 UDS + benzo (home meds) VTE prophylaxis -Lovenox aspirin  81 mg daily prior to admission, now on aspirin  81 mg daily and clopidogrel 75 mg daily for 3 weeks and then plavix alone. Therapy recommendations:  Pending Disposition: Pending  Hypertension Home meds: Metoprolol  75 mg 3 times daily, nifedipine  60 mg daily Stable Long term BP goal normotensive  Hyperlipidemia Home meds: None LDL 136, goal < 70 No statin now due to previous intolerance Consider Leqvio or PCSK9 inhibitor if patient amenable and medication assistance can be found  Other Stroke Risk Factors Advanced age Possible remote history of stroke, did not seek medical attention for this CAD with heart attack in 1993 (per pt)  Other Active Problems Food insecurity-TOC consult placed Breast cancer stage I  Hospital day # 0  Patient seen by NP with MD, MD to edit note as needed. Cortney E Everitt Clint Kill , MSN, AGACNP-BC [facility] See Amion for schedule and pager information 04/01/2024 1:56 PM   ATTENDING NOTE: I reviewed above note and agree with the assessment and plan. Pt was seen and examined.   Dr. Claudene is at the bedside. Pt reclining in bed, stated that she had heart attack in 1993, had stroke before but did not seek medical attention, she has a daughter that she has lost contact due to her daughter's suicidal ideation and behavior hospital confinement. She has difficulty with her finance and would not able to afford loop recorder monitoring or copay of 30 day heart monitoring. Neuro exam essentially intact for now. Will complete stroke work up, now on DAPT.   For detailed assessment and plan, please refer to above as I have made changes wherever appropriate.   Ary Cummins, MD PhD Stroke Neurology 04/01/2024 2:36 PM  I discussed with Dr. Claudene. I  spent additional 30 min inpatient face-to-face time with the patient and family, reviewing test results, images and medication, and discussing the diagnosis, treatment plan and potential prognosis. This patient's care requiresreview of multiple databases, neurological assessment, discussion with family, other specialists and medical decision making of high complexity.      To contact Stroke Continuity provider, please refer to [email or web address]. After hours, contact General Neurology

## 2024-04-01 NOTE — ED Provider Notes (Signed)
 [place] EMERGENCY DEPARTMENT AT [facility] Provider Note   CSN: [id number] Arrival date & time: 03/31/24  1506     Patient presents with: [name]   ANIKAH HOGGE is a 78 y.o. female.   The history is provided by the patient.  Fall   She has history of hypertension, hyperlipidemia, coronary artery disease, hypothyroidism and comes in because of falls at home.  She states she has fallen 3 times in the last several weeks.  She states that her legs just give out on her.  She denies injury and the falls.  She does relate that the brand of her thyroid  medication was changed with the last refill.  She denies fever or chills and denies nausea or vomiting.    Prior to Admission medications   Medication Sig Start Date End Date Taking? Authorizing Provider  ampicillin  (PRINCIPEN) 500 MG capsule Take 1 capsule (500 mg total) by mouth 4 (four) times daily. 08/22/22   Plotnikov, Karlynn GAILS, MD  aspirin  EC 81 MG tablet Take 1 tablet (81 mg total) by mouth 2 (two) times daily. 03/14/23   Plotnikov, Aleksei V, MD  Cholecalciferol 1000 UNITS tablet Take 1,000 Units by mouth daily.    [provider]  clobetasol  ointment (TEMOVATE ) 0.05 % Apply 1 Application topically 2 (two) times daily as needed. 03/12/24   Plotnikov, Aleksei V, MD  clorazepate  (TRANXENE ) 15 MG tablet TAKE 1 TABLET BY MOUTH THREE TIMES DAILY AS NEEDED FOR ANXIETY. DO NOT TAKE WITH LORAZEPAM . 12/10/23   Plotnikov, Karlynn GAILS, MD  diphenoxylate -atropine  (LOMOTIL ) 2.5-0.025 MG tablet Take 1 tablet by mouth 4 (four) times daily as needed for diarrhea or loose stools. 12/10/23   Plotnikov, Karlynn GAILS, MD  doxepin  (SINEQUAN ) 50 MG capsule Take 3 capsules (150 mg total) by mouth at bedtime. 06/27/23   Plotnikov, Aleksei V, MD  furosemide  (LASIX ) 40 MG tablet Take 1 tablet (40 mg total) by mouth 2 (two) times daily. 06/27/23   Plotnikov, Aleksei V, MD  levothyroxine  (SYNTHROID ) 100 MCG tablet Start Levothyroxine  100 mcg every other  day and 50 mcg every other day. 02/13/24   Plotnikov, Aleksei V, MD  LORazepam  (ATIVAN ) 0.5 MG tablet Take 1 tablet (0.5 mg total) by mouth at bedtime. 02/13/24   Plotnikov, Karlynn GAILS, MD  methylPREDNISolone  (MEDROL  DOSEPAK) 4 MG TBPK tablet As directed 12/10/23   Plotnikov, Aleksei V, MD  metoprolol  tartrate (LOPRESSOR ) 50 MG tablet Take 1.5 tablets (75 mg total) by mouth 3 (three) times daily. 02/13/23 03/12/24  Plotnikov, Karlynn GAILS, MD  NIFEdipine  (PROCARDIA  XL) 60 MG 24 hr tablet Take 1 tablet (60 mg total) by mouth daily. 06/27/23   Plotnikov, Karlynn GAILS, MD  nitrofurantoin , macrocrystal-monohydrate, (MACROBID ) 100 MG capsule Take 1 capsule (100 mg total) by mouth 2 (two) times daily. 02/19/23   Plotnikov, Aleksei V, MD  nitroGLYCERIN  (NITROLINGUAL ) 0.4 MG/SPRAY spray PLACE 1 SPRAY UNDER THE TONGUE EVERY 5 MINUTES AS NEEDED FOR CHEST PAIN. MAY REPEAT 3 TIMES 03/28/22   Wonda Sharper, MD  potassium chloride  SA (KLOR-CON  M) 20 MEQ tablet Take 1 tablet (20 mEq total) by mouth daily. 06/27/23   Plotnikov, Aleksei V, MD  triamcinolone  ointment (KENALOG ) 0.1 % Apply 1 Application topically 4 (four) times daily as needed. On hemorrhoids 08/22/22   Plotnikov, Karlynn GAILS, MD    Allergies: Epinephrine , Isosorbide mononitrate, Lidocaine , Morphine, Propoxyphene n-acetaminophen, Alprazolam, Atorvastatin, Codeine, Ezetimibe, Prilosec [omeprazole ], Rosuvastatin, Simvastatin, Allopurinol , Clonidine  derivatives, Meloxicam , Metronidazole , Tetracycline, Triple antibiotic [bacitracin-neomycin-polymyxin], Amoxicillin , Ciprofloxacin, and Diphenhydramine  hcl    Review of Systems  All other systems reviewed and are negative.   Updated Vital Signs BP (!) 158/90 (BP Location: Right Arm)   Pulse 86   Temp 98 F (36.7 C)   Resp 20   Ht 5' 6 (1.676 m)   Wt 68 kg   SpO2 100%   BMI 24.21 kg/m   Physical Exam Vitals and nursing note reviewed.   78 year old female, resting comfortably and in no acute distress. Vital signs  are significant for elevated blood pressure. Oxygen saturation is 100%, which is normal. Head is normocephalic and atraumatic. PERRLA, EOMI.  Neck is nontender and supple without adenopathy. Lungs are clear without rales, wheezes, or rhonchi. Chest is nontender. Heart has regular rate and rhythm without murmur. Abdomen is soft, flat, nontender. Extremities have no cyanosis or edema, full range of motion is present. Skin is warm and dry without rash. Neurologic: Awake and alert and oriented, speech is normal.  Cranial nerves are intact.  Strength is 5/5 in all 4 extremities.  Finger-nose testing is normal.  (all labs ordered are listed, but only abnormal results are displayed) Labs Reviewed  COMPREHENSIVE METABOLIC PANEL WITH GFR - Abnormal; Notable for the following components:      Result Value   Potassium 3.3 (*)    Glucose, Bld 134 (*)    Creatinine, Ser 1.49 (*)    Calcium 8.7 (*)    GFR, Estimated 35 (*)    All other components within normal limits  CBC - Abnormal; Notable for the following components:   RBC 3.77 (*)    All other components within normal limits  URINALYSIS, ROUTINE W REFLEX MICROSCOPIC - Abnormal; Notable for the following components:   APPearance CLOUDY (*)    Hgb urine dipstick MODERATE (*)    Protein, ur 30 (*)    Nitrite POSITIVE (*)    Leukocytes,Ua LARGE (*)    Bacteria, UA MANY (*)    All other components within normal limits  CBG MONITORING, ED - Abnormal; Notable for the following components:   Glucose-Capillary 132 (*)    All other components within normal limits  TSH  T4, FREE    EKG: None  Radiology: CT Head Wo Contrast Result Date: 03/31/2024 EXAM: CT HEAD WITHOUT CONTRAST 03/31/2024 08:31:01 PM TECHNIQUE: CT of the head was performed without the administration of intravenous contrast. Automated exposure control, iterative reconstruction, and/or weight based adjustment of the mA/kV was utilized to reduce the radiation dose to as low as  reasonably achievable. COMPARISON: 03/15/2023 CLINICAL HISTORY: fall, hit back of head FINDINGS: BRAIN AND VENTRICLES: No acute hemorrhage. No evidence of acute infarct. No extra-axial collection. No mass effect or midline shift. Moderate patchy supratentorial white matter hypodensities. Mild global atrophy. ORBITS: No acute abnormality. SINUSES: Chronic opacification of left sphenoid sinus. Mild paranasal sinus mucosal thickening. SOFT TISSUES AND SKULL: No acute soft tissue abnormality. No skull fracture. IMPRESSION: 1. No acute intracranial abnormality related to the fall. 2. Moderate supratentorial white matter hypodensities, likely chronic microvascular ischemic changes, and mild global atrophy. Electronically signed by: Oneil Devonshire MD 03/31/2024 08:33 PM EST RP Workstation: [id number]     Procedures   Medications Ordered in the ED  cephALEXin  (KEFLEX ) capsule 500 mg (has no administration in time range)  ondansetron (ZOFRAN-ODT) disintegrating tablet 4 mg (has no administration in time range)  Medical Decision Making Amount and/or Complexity of Data Reviewed Labs: ordered. Radiology: ordered.  Risk Prescription drug management. Decision regarding hospitalization.   Falls at home of uncertain cause.  Consider electrolyte disturbance, occult infection such as UTI, occult stroke, hyper or hypothyroidism.  I have reviewed her laboratory tests, my interpretation is borderline hypokalemia, elevated random glucose level, stable renal insufficiency, normal CBC, urinalysis concerning for infection with greater than 50 WBCs and many bacteria and WBC clumps present.  T4 and TSH are normal.  CT of head shows no acute intracranial process.  I have reviewed her electrocardiogram, and my interpretation is normal ECG and unchanged from prior.  I have ordered a dose of oral potassium and I have ordered a dose of cephalexin .  I have requested a trial of ambulation in the  emergency department.  Patient was unable to ambulate.  Specifically, she is having difficulty using her right leg.  She does state that she had injured it in a fall, but on reexam there is still no pain with passive range of motion.  There is mild tenderness to palpation over the posterior aspect of the right hip.  I have ordered hip and knee x-rays.  I have also ordered an MRI of the brain to rule out occult stroke.  MRI shows numerous foci of restricted diffusion in the subcortical and periventricular white matter in the right frontal and parietal lobes consistent with acute infarcts.  I have independently reviewed the images, and agree with the radiologist's interpretation.  I do not believe that these infarcts are related to her difficulty walking and this seems to be due to just a simple bruise of her right hip.  However, she needs to be admitted for acute stroke evaluation.  I have discussed the case with Dr. Vanessa of neurology service who agrees to see the patient in consultation, I have discussed case with Dr. Alfornia of [facility], who agrees to admit the patient.  CRITICAL CARE Performed by: Alm Lias Total critical care time: 40 minutes Critical care time was exclusive of separately billable procedures and treating other patients. Critical care was necessary to treat or prevent imminent or life-threatening deterioration. Critical care was time spent personally by me on the following activities: development of treatment plan with patient and/or surrogate as well as nursing, discussions with consultants, evaluation of patient's response to treatment, examination of patient, obtaining history from patient or surrogate, ordering and performing treatments and interventions, ordering and review of laboratory studies, ordering and review of radiographic studies, pulse oximetry and re-evaluation of patient's condition.     Final diagnoses:  Cerebrovascular accident (CVA),  unspecified mechanism (HCC)  Fall at home, initial encounter  Contusion of right hip, initial encounter  Hypokalemia  Renal insufficiency    ED Discharge Orders     None          Lias Alm, MD 04/01/24 0800

## 2024-04-01 NOTE — ED Notes (Signed)
 Attempted to ambulate patient. Unsteady when attempting to stand up, appears to having a buckle in the right knee. Patient unsteady when taking steps with right leg appearing to give out.

## 2024-04-01 NOTE — H&P (Signed)
 History and Physical    Patient: [name] FMW:[id number] DOB: [birth year] DOA: 03/31/2024 DOS: the patient was seen and examined on 04/01/2024 PCP: Plotnikov, Karlynn GAILS, MD  Patient coming from: Home  Chief Complaint:  Chief Complaint  Patient presents with   Fall   HPI: [name] is a 78 y.o. female with medical history significant of hypertension, hypothyroidism, CAD, anxiety, depression presents with multiple falls and difficulty walking.  She has experienced multiple falls, approximately five or six times over the last couple days, due to her knees not supporting her. The right leg is weaker at baseline that she reports is due to a fall back in January of this year when she fell trying to get out of her car due to the ice.  Recently, she has experienced increased difficulty with mobility, particularly when trying to get out of a recliner. She describes her legs as 'not substantial enough to hold me up,' leading to crawling on the floor to prevent falls. On Tuesday, she fell while trying to let her dog out, describing a sensation of moving in a circle, akin to being on a merry-go-round, and possibly feeling dizzy.  She reports a prolapsed bladder, which has been present for a couple of years, requiring her to manually reposition it. She experiences frequent urination, especially after taking Lasix , which she takes in the morning along with other medications.  She has no family in the area, as she was transferred to [facility] for work and has since retired. 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 All of her family members are deceased. No chest pain and heart racing. She is unsure if she hit her head during falls and is uncertain about dizziness. No discomfort with urination but has a prolapsed bladder.  In the emergency department patient was noted to be afebrile with heart rates elevated up to 103,  blood pressures 146/92 to 171/90, and all other vital signs maintained.  Labs from 11/11 significant for potassium 3.3, BUN 19, creatinine 1.49, and calcium 8.7.  Urinalysis positive for large leukocytes, positive nitrites, many bacteria, 11-20 RBCs/hpf, 6-10 squamous epithelial cells, and greater than 50 WBCs.  UDS positive for benzos.  CT scan of the head did not reveal any acute abnormality.  Subsequently MRI of the brain revealed numerous foci of restricted diffusion in the subcortical and periventricular white matter of the right frontal and parietal lobes consistent with acute infarcts.  Patient had been given Zofran, potassium chloride  40 mill equivalents, Ativan  1 mg p.o., and cephalexin .   Review of Systems: As mentioned in the history of present illness. All other systems reviewed and are negative. Past Medical History:  Diagnosis Date   Anxiety state, unspecified    Bronchitis, acute    Carotid bruit    Coronary atherosclerosis of unspecified type of vessel, native or graft    multiple angioplasy procedures in 90's/early 2000's   De Quervain's tenosynovitis    Depressive disorder, not elsewhere classified    Female bladder prolapse    Lichen 2011   Vaginal   Other and unspecified hyperlipidemia    Unspecified essential hypertension    Unspecified hypothyroidism    Past Surgical History:  Procedure Laterality Date   MOUTH SURGERY     NOSE SURGERY  1999   TOTAL VAGINAL HYSTERECTOMY  1994   Social History:  reports that she quit smoking about 32 years ago. Her smoking use included cigarettes.  She has never used smokeless tobacco. She reports that she does not drink alcohol and does not use drugs.  Allergies  Allergen Reactions   Epinephrine  Other (See Comments)    unknown   Isosorbide Mononitrate Other (See Comments)    Severe headache   Lidocaine  Nausea And Vomiting   Morphine Other (See Comments)    unknown   Propoxyphene N-Acetaminophen Other (See Comments)    unknown    Alprazolam Other (See Comments)    REACTION: very sedating   Atorvastatin Other (See Comments)    REACTION: weakness   Codeine Nausea And Vomiting   Ezetimibe Other (See Comments)    REACTION: myalgia   Prilosec [Omeprazole ] Diarrhea and Other (See Comments)    Chest discomfort   Rosuvastatin Other (See Comments)    REACTION: leg weakness   Simvastatin Other (See Comments)    REACTION: leg  weakness   Allopurinol      ?bruising per pt   Clonidine  Derivatives     whoozy   Meloxicam      diarrhea   Metronidazole      Abd cramps   Tetracycline Nausea And Vomiting   Triple Antibiotic [Bacitracin-Neomycin-Polymyxin]     rash   Amoxicillin  Nausea And Vomiting    Can take Ampicillin    Ciprofloxacin Nausea And Vomiting   Diphenhydramine Hcl Anxiety    Family History  Problem Relation Age of Onset   Hypertension Mother    Hypertension Father    Breast cancer Sister        dx. at unknown age   Hypertension Other    Coronary artery disease Other     Prior to Admission medications   Medication Sig Start Date End Date Taking? Authorizing Provider  ampicillin  (PRINCIPEN) 500 MG capsule Take 1 capsule (500 mg total) by mouth 4 (four) times daily. 08/22/22   Plotnikov, Karlynn GAILS, MD  aspirin  EC 81 MG tablet Take 1 tablet (81 mg total) by mouth 2 (two) times daily. 03/14/23   Plotnikov, Aleksei V, MD  Cholecalciferol 1000 UNITS tablet Take 1,000 Units by mouth daily.    [provider]  clobetasol  ointment (TEMOVATE ) 0.05 % Apply 1 Application topically 2 (two) times daily as needed. 03/12/24   Plotnikov, Aleksei V, MD  clorazepate  (TRANXENE ) 15 MG tablet TAKE 1 TABLET BY MOUTH THREE TIMES DAILY AS NEEDED FOR ANXIETY. DO NOT TAKE WITH LORAZEPAM . 12/10/23   Plotnikov, Karlynn GAILS, MD  diphenoxylate -atropine  (LOMOTIL ) 2.5-0.025 MG tablet Take 1 tablet by mouth 4 (four) times daily as needed for diarrhea or loose stools. 12/10/23   Plotnikov, Karlynn GAILS, MD  doxepin  (SINEQUAN ) 50 MG  capsule Take 3 capsules (150 mg total) by mouth at bedtime. 06/27/23   Plotnikov, Aleksei V, MD  furosemide  (LASIX ) 40 MG tablet Take 1 tablet (40 mg total) by mouth 2 (two) times daily. 06/27/23   Plotnikov, Aleksei V, MD  levothyroxine  (SYNTHROID ) 100 MCG tablet Start Levothyroxine  100 mcg every other day and 50 mcg every other day. 02/13/24   Plotnikov, Aleksei V, MD  LORazepam  (ATIVAN ) 0.5 MG tablet Take 1 tablet (0.5 mg total) by mouth at bedtime. 02/13/24   Plotnikov, Karlynn GAILS, MD  methylPREDNISolone  (MEDROL  DOSEPAK) 4 MG TBPK tablet As directed 12/10/23   Plotnikov, Aleksei V, MD  metoprolol  tartrate (LOPRESSOR ) 50 MG tablet Take 1.5 tablets (75 mg total) by mouth 3 (three) times daily. 02/13/23 03/12/24  Plotnikov, Karlynn GAILS, MD  NIFEdipine  (PROCARDIA  XL) 60 MG 24 hr tablet Take 1 tablet (60 mg total)  by mouth daily. 06/27/23   Plotnikov, Karlynn GAILS, MD  nitrofurantoin , macrocrystal-monohydrate, (MACROBID ) 100 MG capsule Take 1 capsule (100 mg total) by mouth 2 (two) times daily. 02/19/23   Plotnikov, Karlynn GAILS, MD  nitroGLYCERIN  (NITROLINGUAL ) 0.4 MG/SPRAY spray PLACE 1 SPRAY UNDER THE TONGUE EVERY 5 MINUTES AS NEEDED FOR CHEST PAIN. MAY REPEAT 3 TIMES 03/28/22   Wonda Sharper, MD  potassium chloride  SA (KLOR-CON  M) 20 MEQ tablet Take 1 tablet (20 mEq total) by mouth daily. 06/27/23   Plotnikov, Aleksei V, MD  triamcinolone  ointment (KENALOG ) 0.1 % Apply 1 Application topically 4 (four) times daily as needed. On hemorrhoids 08/22/22   Garald Karlynn GAILS, MD    Physical Exam: Vitals:   03/31/24 2131 04/01/24 0044 04/01/24 0354 04/01/24 0749  BP: (!) 165/84 (!) 158/90 (!) 146/92 (!) 171/90  Pulse: 79 86 93 (!) 103  Resp: 18 20 17 18   Temp: 98 F (36.7 C)  97.7 F (36.5 C) 97.9 F (36.6 C)  TempSrc:   Oral   SpO2: 97% 100% 100% 100%  Weight:      Height:       Constitutional: Elderly female currently NAD, calm, comfortable Eyes: PERRL, lids and conjunctivae normal ENMT: Mucous membranes are  moist.  Normal dentition.  Neck: normal, supple, no masses, no thyromegaly Respiratory: clear to auscultation bilaterally, no wheezing, no crackles. Normal respiratory effort. No accessory muscle use.  Cardiovascular: Regular rate and rhythm, no murmurs / rubs / gallops. No extremity edema. 2+ pedal pulses.   Abdomen: no tenderness, no masses palpated. No hepatosplenomegaly. Bowel sounds positive.  Musculoskeletal: no clubbing / cyanosis. . Good ROM, no contractures. Normal muscle tone.  Skin: no rashes, lesions, ulcers. No induration Neurologic: CN 2-12 grossly intact.  Appears to be relatively intact in all 4 extremities.  Gait was not tested Psychiatric: Normal judgment and insight. Alert and oriented x 3. Normal mood.   Data Reviewed:   Reviewed labs, imaging, and pertinent records as documented.  Assessment and Plan:  CVA Patient presents after having multiple falls.  Initial CT did not note any acute abnormality.  Patient was not a candidate for thrombolytics due to being outside the window.  MRI revealed numerous infarcts in the subcortical and periventricular white matter of the right frontal and parietal lobes consistent with acute infarcts. - Admit to a telemetry bed - Stroke order set utilized - Neurochecks - Check CTA of the head and neck - Check echocardiogram - PT/OT/speech to evaluate and treat - Aspirin  and Plavix - Transitions of care consulted, will follow-up for any further recommendation  Bladder prolapse Urinary tract infection Present on admission.  Patient reports history of bladder prolapse.  Urinalysis positive for large leukocytes, positive nitrites, many bacteria, 11-20 RBCs/hpf, 6-10 squamous epithelial cells, and greater than 50 WBCs.  Patient had initially been started on  cephalexin . - Changed to Rocephin IV  Hypokalemia Acute.  Initial potassium noted to be 3.3.  Patient was given potassium chloride  40 mEq p.o. - Continue to monitor and replace as  needed  Hyperlipidemia Lipid panel noted total cholesterol of 208 with LDL 136.  Patient unable to tolerate statins - Possibly a candidate for Repatha in outpatient setting  Hypothyroidism - Continue levothyroxine   DVT prophylaxis: Lovenox Advance Care Planning:   Code Status: Full Code   Consults: Neurology  Family Communication:   Severity of Illness: The appropriate patient status for this patient is INPATIENT. Inpatient status is judged to be reasonable and necessary in order  to provide the required intensity of service to ensure the patient's safety. The patient's presenting symptoms, physical exam findings, and initial radiographic and laboratory data in the context of their chronic comorbidities is felt to place them at high risk for further clinical deterioration. Furthermore, it is not anticipated that the patient will be medically stable for discharge from the hospital within 2 midnights of admission.   * I certify that at the point of admission it is my clinical judgment that the patient will require inpatient hospital care spanning beyond 2 midnights from the point of admission due to high intensity of service, high risk for further deterioration and high frequency of surveillance required.*  Author: Maximino DELENA Sharps, MD 04/01/2024 8:45 AM  For on call review [email or web address].

## 2024-04-01 NOTE — ED Notes (Signed)
 Patient transported to X-ray

## 2024-04-01 NOTE — ED Notes (Signed)
 Patient transported to MRI

## 2024-04-02 ENCOUNTER — Inpatient Hospital Stay (HOSPITAL_COMMUNITY)

## 2024-04-02 DIAGNOSIS — S7001XA Contusion of right hip, initial encounter: Secondary | ICD-10-CM | POA: Diagnosis not present

## 2024-04-02 DIAGNOSIS — E785 Hyperlipidemia, unspecified: Secondary | ICD-10-CM | POA: Diagnosis not present

## 2024-04-02 DIAGNOSIS — I639 Cerebral infarction, unspecified: Secondary | ICD-10-CM

## 2024-04-02 DIAGNOSIS — I739 Peripheral vascular disease, unspecified: Secondary | ICD-10-CM | POA: Diagnosis not present

## 2024-04-02 DIAGNOSIS — W19XXXA Unspecified fall, initial encounter: Secondary | ICD-10-CM | POA: Diagnosis not present

## 2024-04-02 DIAGNOSIS — Y92009 Unspecified place in unspecified non-institutional (private) residence as the place of occurrence of the external cause: Secondary | ICD-10-CM

## 2024-04-02 DIAGNOSIS — R29700 NIHSS score 0: Secondary | ICD-10-CM | POA: Diagnosis not present

## 2024-04-02 DIAGNOSIS — I6340 Cerebral infarction due to embolism of unspecified cerebral artery: Secondary | ICD-10-CM | POA: Diagnosis not present

## 2024-04-02 LAB — CBC
HCT: 32 % — ABNORMAL LOW (ref 36.0–46.0)
Hemoglobin: 10.8 g/dL — ABNORMAL LOW (ref 12.0–15.0)
MCH: 32.1 pg (ref 26.0–34.0)
MCHC: 33.8 g/dL (ref 30.0–36.0)
MCV: 95.2 fL (ref 80.0–100.0)
Platelets: 185 K/uL (ref 150–400)
RBC: 3.36 MIL/uL — ABNORMAL LOW (ref 3.87–5.11)
RDW: 12.5 % (ref 11.5–15.5)
WBC: 4.1 K/uL (ref 4.0–10.5)
nRBC: 0 % (ref 0.0–0.2)

## 2024-04-02 LAB — BASIC METABOLIC PANEL WITH GFR
Anion gap: 10 (ref 5–15)
BUN: 9 mg/dL (ref 8–23)
CO2: 22 mmol/L (ref 22–32)
Calcium: 8.6 mg/dL — ABNORMAL LOW (ref 8.9–10.3)
Chloride: 105 mmol/L (ref 98–111)
Creatinine, Ser: 1.31 mg/dL — ABNORMAL HIGH (ref 0.44–1.00)
GFR, Estimated: 41 mL/min — ABNORMAL LOW (ref >60)
Glucose, Bld: 90 mg/dL (ref 70–99)
Potassium: 3.9 mmol/L (ref 3.5–5.1)
Sodium: 137 mmol/L (ref 135–145)

## 2024-04-02 MED ORDER — ENOXAPARIN SODIUM 40 MG/0.4ML IJ SOSY
40.0000 mg | PREFILLED_SYRINGE | Freq: Every day | INTRAMUSCULAR | Status: DC
  Administered 2024-04-03 – 2024-04-07 (×5): 40 mg via SUBCUTANEOUS
  Filled 2024-04-02 (×5): qty 0.4

## 2024-04-02 MED ORDER — IOHEXOL 350 MG/ML SOLN
75.0000 mL | Freq: Once | INTRAVENOUS | Status: AC | PRN
  Administered 2024-04-02: 75 mL via INTRAVENOUS

## 2024-04-02 NOTE — Hospital Course (Signed)
 78 y.o. female with medical history significant of hypertension, hypothyroidism, CAD, anxiety, depression presents with multiple falls and difficulty walking.   She has experienced multiple falls, approximately five or six times over the last couple days, due to her knees not supporting her. The right leg is weaker at baseline that she reports is due to a fall back in January of this year when she fell trying to get out of her car due to the ice.   Recently, she has experienced increased difficulty with mobility, particularly when trying to get out of a recliner. She describes her legs as 'not substantial enough to hold me up,' leading to crawling on the floor to prevent falls. On Tuesday, she fell while trying to let her dog out, describing a sensation of moving in a circle, akin to being on a merry-go-round, and possibly feeling dizzy.   She reports a prolapsed bladder, which has been present for a couple of years, requiring her to manually reposition it. She experiences frequent urination, especially after taking Lasix , which she takes in the morning along with other medications.   She has no family in the area, as she was transferred to [facility] for work and has since retired. 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 All of her family members are deceased. No chest pain and heart racing. She is unsure if she hit her head during falls and is uncertain about dizziness. No discomfort with urination but has a prolapsed bladder.   In the emergency department patient was noted to be afebrile with heart rates elevated up to 103, blood pressures 146/92 to 171/90, and all other vital signs maintained.  Labs from 11/11 significant for potassium 3.3, BUN 19, creatinine 1.49, and calcium 8.7.  Urinalysis positive for large leukocytes, positive nitrites, many bacteria, 11-20 RBCs/hpf, 6-10 squamous epithelial cells, and greater than  50 WBCs.  UDS positive for benzos.  CT scan of the head did not reveal any acute abnormality.  Subsequently MRI of the brain revealed numerous foci of restricted diffusion in the subcortical and periventricular white matter of the right frontal and parietal lobes consistent with acute infarcts.  Patient had been given Zofran, potassium chloride  40 mill equivalents, Ativan  1 mg p.o., and cephalexin .

## 2024-04-02 NOTE — Plan of Care (Signed)

## 2024-04-02 NOTE — Progress Notes (Signed)
 Physical Therapy Treatment Patient Details Name: [name] MRN: [id number] DOB: 1945-12-12 Today's Date: 04/02/2024   History of Present Illness Pt is 78 year old presented [facility] on  03/31/24 for multiple falls. 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 PMH - htn, hypothyroidism, depression    PT Comments  Pt received in the recliner and agreeable to session with encouragement. Pt reports fatigue from OT session earlier and recently returning from the bathroom. Pt able to perform BLE exercises while conversing, but noted decrease in quality of exercise with dual task. Pt able to stand and perform static standing marches with low foot clearance, but is limited by fatigue and reports increased shortness of breath once seated. Pt continues to benefit from PT services to progress toward functional mobility goals.    If plan is discharge home, recommend the following: A little help with walking and/or transfers;A little help with bathing/dressing/bathroom;Assist for transportation;Assistance with cooking/housework   Can travel by Training And Development Officer (2 wheels)    Recommendations for Other Services       Precautions / Restrictions Precautions Precautions: Fall     Mobility  Bed Mobility               General bed mobility comments: Pt in recliner at beginning and end of session    Transfers Overall transfer level: Needs assistance Equipment used: Rolling walker (2 wheels) Transfers: Sit to/from Stand Sit to Stand: Min assist           General transfer comment: cues for hand placement and min A for anterior weight shift    Ambulation/Gait               General Gait Details: Pt declined due to fatigue   Stairs             Wheelchair Mobility     Tilt Bed    Modified Rankin (Stroke Patients Only) Modified Rankin (Stroke Patients Only) Pre-Morbid  Rankin Score: No significant disability Modified Rankin: Moderately severe disability     Balance Overall balance assessment: Needs assistance Sitting-balance support: No upper extremity supported, Feet supported Sitting balance-Leahy Scale: Good     Standing balance support: Bilateral upper extremity supported, During functional activity, Reliant on assistive device for balance Standing balance-Leahy Scale: Poor Standing balance comment: with RW and min A for balance                            Communication Communication Communication: Impaired Factors Affecting Communication: Hearing impaired  Cognition Arousal: Alert Behavior During Therapy: WFL for tasks assessed/performed   PT - Cognitive impairments: No family/caregiver present to determine baseline, Memory, Other, Attention                       PT - Cognition Comments: tangential and poor attention with pt quickly forgetting train of thought. Following commands: Impaired Following commands impaired: Follows multi-step commands with increased time    Cueing    Exercises General Exercises - Lower Extremity Long Arc Quad: AROM, 20 reps, Both, Seated Hip Flexion/Marching: AROM, Standing, Both, 15 reps    General Comments General comments (skin integrity, edema, etc.): HR up to 130 bpm with activity      Pertinent Vitals/Pain Pain Assessment Pain Assessment: Faces Faces Pain Scale: Hurts a little bit Pain Location: back  Pain Descriptors / Indicators: Aching Pain Intervention(s): Limited activity within patient's tolerance, Monitored during session, Repositioned     PT Goals (current goals can now be found in the care plan section) Acute Rehab PT Goals Patient Stated Goal: return home to dog PT Goal Formulation: With patient Time For Goal Achievement: 04/15/24 Progress towards PT goals: Progressing toward goals    Frequency    Min 3X/week       AM-PAC PT 6 Clicks Mobility    Outcome Measure  Help needed turning from your back to your side while in a flat bed without using bedrails?: A Little Help needed moving from lying on your back to sitting on the side of a flat bed without using bedrails?: A Little Help needed moving to and from a bed to a chair (including a wheelchair)?: A Little Help needed standing up from a chair using your arms (e.g., wheelchair or bedside chair)?: A Little Help needed to walk in hospital room?: Total Help needed climbing 3-5 steps with a railing? : Total 6 Click Score: 14    End of Session Equipment Utilized During Treatment: Gait belt Activity Tolerance: Patient tolerated treatment well;Patient limited by fatigue Patient left: in chair;with call bell/phone within reach;with chair alarm set Nurse Communication: Mobility status PT Visit Diagnosis: Unsteadiness on feet (R26.81);Other abnormalities of gait and mobility (R26.89);Repeated falls (R29.6);History of falling (Z91.81);Muscle weakness (generalized) (M62.81)     Time: 1351-1410 PT Time Calculation (min) (ACUTE ONLY): 19 min  Charges:    $Therapeutic Exercise: 8-22 mins PT General Charges $$ ACUTE PT VISIT: 1 Visit                    Darryle George, PTA Acute Rehabilitation Services Secure Chat Preferred  [phone]    Darryle George 04/02/2024, 3:08 PM

## 2024-04-02 NOTE — TOC CAGE-AID Note (Signed)
 Transition of Care [facility]) - CAGE-AID Screening   Patient Details  Name: [name] MRN: [id number] Date of Birth: April 15, 1946  Transition of Care [facility]) CM/SW Contact:    Charee Tumblin E Anaelle Dunton, LCSW Phone Number: 04/02/2024, 9:54 AM   Clinical Narrative: No SA noted.   CAGE-AID Screening:    Have You Ever Felt You Ought to Cut Down on Your Drinking or Drug Use?: No Have People Annoyed You By Critizing Your Drinking Or Drug Use?: No Have You Felt Bad Or Guilty About Your Drinking Or Drug Use?: No Have You Ever Had a Drink or Used Drugs First Thing In The Morning to Steady Your Nerves or to Get Rid of a Hangover?: No CAGE-AID Score: 0  Substance Abuse Education Offered: No

## 2024-04-02 NOTE — Consult Note (Addendum)
 Physical Medicine and Rehabilitation Consult Reason for Consult:impaired balance and functional mobility Referring Physician: Jerri   HPI: [name] is a 78 y.o. female with a hx of breast cancer, CAD, hypothyroid, hypertension, depression who was experiencing multiple falls at home. She was brought to the ED yesterday and CT of head negative for acute abnormality. MRI demonstrated numerous foci of restricted diffusion in subcortical and periventricular white matter of right frontal and parietal lobes as well as in left occipital lobe and right cerebellum. Pt on asa pta, now on asa + plavix for 3 weeks then plavix alone. Neurology feels stroke likely embolic. Loop recorder recommended. Other issues during hospitalization iniclude HTN, hypokalemia, and UTI (on rocephin currently). Pt was up with therapy yesterday and was min assist for 15' using RW, min assist with transfers. She lives alone in a one level apartment, independent and driving prior to admit.     Home: Home Living Family/patient expects to be discharged to:: Private residence Living Arrangements: Alone Type of Home: Apartment Home Access: Level entry Home Layout: One level Bathroom Shower/Tub: Tub/shower unit, Engineer, Building Services: Standard Home Equipment: Cane - single point, Information systems manager, Toilet riser  Functional History: Prior Function Prior Level of Function : Independent/Modified Independent, Driving Mobility Comments: No assistive device ADLs Comments: Independent Functional Status:  Mobility: Bed Mobility Overal bed mobility: Needs Assistance Bed Mobility: Supine to Sit Supine to sit: Contact guard General bed mobility comments: Assist for safety Transfers Overall transfer level: Needs assistance Equipment used: Rolling walker (2 wheels) Transfers: Sit to/from Stand, Bed to chair/wheelchair/BSC Sit to Stand: Min assist Bed to/from chair/wheelchair/BSC transfer type:: Step pivot Step pivot  transfers: Min assist General transfer comment: Assist to power up and balance. Cues for hand placement Bed to bsc to recliner with walker and assist for balance and support Ambulation/Gait Ambulation/Gait assistance: Min assist Gait Distance (Feet): 15 Feet Assistive device: Rolling walker (2 wheels) Gait Pattern/deviations: Step-through pattern, Decreased step length - right, Decreased step length - left, Trunk flexed, Narrow base of support General Gait Details: Assist for balance and support. When standing to amb LE's tremulous. Returned to sitting. Stood again and legs more stable. Gait velocity: decr Gait velocity interpretation: <1.31 ft/sec, indicative of household ambulator    ADL:    Cognition: Cognition Orientation Level: Oriented X4 Cognition Arousal: Alert Behavior During Therapy: WFL for tasks assessed/performed   Review of Systems  Constitutional:  Negative for fever.  HENT: Negative.    Eyes:  Positive for blurred vision.  Respiratory: Negative.    Cardiovascular: Negative.   Gastrointestinal: Negative.   Genitourinary: Negative.   Musculoskeletal:  Positive for joint pain.  Skin:  Negative for rash.  Neurological:  Positive for sensory change, focal weakness and weakness.  Psychiatric/Behavioral:  The patient is nervous/anxious.    Past Medical History:  Diagnosis Date   Anxiety state, unspecified    Bronchitis, acute    Carotid bruit    Coronary atherosclerosis of unspecified type of vessel, native or graft    multiple angioplasy procedures in 90's/early 2000's   De Quervain's tenosynovitis    Depressive disorder, not elsewhere classified    Female bladder prolapse    Lichen 2011   Vaginal   Other and unspecified hyperlipidemia    Unspecified essential hypertension    Unspecified hypothyroidism    Past Surgical History:  Procedure Laterality Date   MOUTH SURGERY     NOSE SURGERY  1999   TOTAL VAGINAL HYSTERECTOMY  1994   Family History   Problem Relation Age of Onset   Hypertension Mother    Hypertension Father    Breast cancer Sister        dx. at unknown age   Hypertension Other    Coronary artery disease Other    Social History:  reports that she quit smoking about 32 years ago. Her smoking use included cigarettes. She has never used smokeless tobacco. She reports that she does not drink alcohol and does not use drugs. Allergies:  Allergies  Allergen Reactions   Epinephrine  Other (See Comments)    unknown   Isosorbide Mononitrate Other (See Comments)    Severe headache   Lidocaine  Nausea And Vomiting   Morphine Other (See Comments)    unknown   Propoxyphene N-Acetaminophen Other (See Comments)    unknown   Alprazolam Other (See Comments)    REACTION: very sedating   Atorvastatin Other (See Comments)    REACTION: weakness   Codeine Nausea And Vomiting   Ezetimibe Other (See Comments)    REACTION: myalgia   Prilosec [Omeprazole ] Diarrhea and Other (See Comments)    Chest discomfort   Rosuvastatin Other (See Comments)    REACTION: leg weakness   Simvastatin Other (See Comments)    REACTION: leg  weakness   Allopurinol      ?bruising per pt   Clonidine  Derivatives     whoozy   Meloxicam      diarrhea   Metronidazole      Abd cramps   Tetracycline Nausea And Vomiting   Triple Antibiotic [Bacitracin-Neomycin-Polymyxin]     rash   Amoxicillin  Nausea And Vomiting    Can take Ampicillin    Ciprofloxacin Nausea And Vomiting   Diphenhydramine Hcl Anxiety   Medications Prior to Admission  Medication Sig Dispense Refill   acetaminophen (TYLENOL) 500 MG tablet Take 500-1,000 mg by mouth every 6 (six) hours as needed for moderate pain (pain score 4-6).     aspirin  EC 81 MG tablet Take 1 tablet (81 mg total) by mouth 2 (two) times daily.     Cholecalciferol 1000 UNITS tablet Take 1,000 Units by mouth daily.     clobetasol  ointment (TEMOVATE ) 0.05 % Apply 1 Application topically 2 (two) times daily as needed.  30 g 3   clorazepate  (TRANXENE ) 15 MG tablet TAKE 1 TABLET BY MOUTH THREE TIMES DAILY AS NEEDED FOR ANXIETY. DO NOT TAKE WITH LORAZEPAM . 270 tablet 1   diphenoxylate -atropine  (LOMOTIL ) 2.5-0.025 MG tablet Take 1 tablet by mouth 4 (four) times daily as needed for diarrhea or loose stools. 40 tablet 1   doxepin  (SINEQUAN ) 50 MG capsule Take 3 capsules (150 mg total) by mouth at bedtime. 90 capsule 3   furosemide  (LASIX ) 40 MG tablet Take 1 tablet (40 mg total) by mouth 2 (two) times daily. (Patient taking differently: Take 40 mg by mouth daily.) 180 tablet 3   levothyroxine  (SYNTHROID ) 100 MCG tablet Start Levothyroxine  100 mcg every other day and 50 mcg every other day. 90 tablet 3   metoprolol  tartrate (LOPRESSOR ) 50 MG tablet Take 1.5 tablets (75 mg total) by mouth 3 (three) times daily. 405 tablet 3   NIFEdipine  (PROCARDIA  XL) 60 MG 24 hr tablet Take 1 tablet (60 mg total) by mouth daily. 90 tablet 3   nitroGLYCERIN  (NITROLINGUAL ) 0.4 MG/SPRAY spray PLACE 1 SPRAY UNDER THE TONGUE EVERY 5 MINUTES AS NEEDED FOR CHEST PAIN. MAY REPEAT 3 TIMES 4.9 g 6   potassium chloride  SA (KLOR-CON  M) 20 MEQ tablet  Take 1 tablet (20 mEq total) by mouth daily. 90 tablet 3   LORazepam  (ATIVAN ) 0.5 MG tablet Take 1 tablet (0.5 mg total) by mouth at bedtime. (Patient not taking: Reported on 04/01/2024) 90 tablet 1     Blood pressure (!) 158/76, pulse 86, temperature 99 F (37.2 C), temperature source Oral, resp. rate 16, height 5' 6 (1.676 m), weight 68 kg, SpO2 96%. Physical Exam Constitutional:      Appearance: She is not ill-appearing.  HENT:     Right Ear: External ear normal.     Left Ear: External ear normal.     Mouth/Throat:     Mouth: Mucous membranes are moist.  Eyes:     Pupils: Pupils are equal, round, and reactive to light.  Cardiovascular:     Rate and Rhythm: Tachycardia present.  Pulmonary:     Effort: Pulmonary effort is normal.  Abdominal:     Palpations: Abdomen is soft.   Musculoskeletal:        General: Tenderness (right posterior knee/hamstrings) present. Normal range of motion.     Cervical back: Normal range of motion.  Skin:    General: Skin is warm.  Neurological:     Mental Status: She is alert.     Comments: Alert and oriented x 3. Normal insight and awareness. Intact Memory. Normal language and speech. Cranial nerve exam unremarkable. MMT: RUE and LUE 4/5 prox to distal. RLE2/5 HF, 3-KE and 4/5 ADF/PF. LLE 3+ to 4/5. Decreased sensation to LT in both feet. No limb ataxia. No abnl resting tone.     Psychiatric:     Comments: Pt pleasant and cooperative. Circumstantial and tangential during conversation.. sl anxious     Results for orders placed or performed during the hospital encounter of 03/31/24 (from the past 24 hours)  Hemoglobin A1c     Status: None   Collection Time: 04/01/24 11:50 AM  Result Value Ref Range   Hgb A1c MFr Bld 5.3 4.8 - 5.6 %   Mean Plasma Glucose 105.41 mg/dL  Lipid panel     Status: Abnormal   Collection Time: 04/01/24 11:51 AM  Result Value Ref Range   Cholesterol 208 (H) 0 - 200 mg/dL   Triglycerides 869 <849 mg/dL   HDL 46 >59 mg/dL   Total CHOL/HDL Ratio 4.5 RATIO   VLDL 26 0 - 40 mg/dL   LDL Cholesterol 863 (H) 0 - 99 mg/dL  CBC     Status: Abnormal   Collection Time: 04/02/24  4:35 AM  Result Value Ref Range   WBC 4.1 4.0 - 10.5 K/uL   RBC 3.36 (L) 3.87 - 5.11 MIL/uL   Hemoglobin 10.8 (L) 12.0 - 15.0 g/dL   HCT 67.9 (L) 63.9 - 53.9 %   MCV 95.2 80.0 - 100.0 fL   MCH 32.1 26.0 - 34.0 pg   MCHC 33.8 30.0 - 36.0 g/dL   RDW 87.4 88.4 - 84.4 %   Platelets 185 150 - 400 K/uL   nRBC 0.0 0.0 - 0.2 %  Basic metabolic panel with GFR     Status: Abnormal   Collection Time: 04/02/24  4:35 AM  Result Value Ref Range   Sodium 137 135 - 145 mmol/L   Potassium 3.9 3.5 - 5.1 mmol/L   Chloride 105 98 - 111 mmol/L   CO2 22 22 - 32 mmol/L   Glucose, Bld 90 70 - 99 mg/dL   BUN 9 8 - 23 mg/dL   Creatinine, Ser 8.68  (  H) 0.44 - 1.00 mg/dL   Calcium 8.6 (L) 8.9 - 10.3 mg/dL   GFR, Estimated 41 (L) >60 mL/min   Anion gap 10 5 - 15   VAS US  LOWER EXTREMITY VENOUS (DVT) Result Date: 04/01/2024  Lower Venous DVT Study Patient Name:  SACHI BOULAY [facility]  Date of Exam:   04/01/2024 Medical Rec #: [id number]     Accession #:    [id number] Date of Birth: 04/09/1946      Patient Gender: F Patient Age:   71 years Exam Location:  [facility] Procedure:      VAS US  LOWER EXTREMITY VENOUS (DVT) Referring Phys: ARY XU --------------------------------------------------------------------------------  Indications: Stroke.  Comparison Study: Previous exam on 08/30/2023 ([facility]) was negative for DVT Performing Technologist: Ezzie Potters RVT, RDMS  Examination Guidelines: A complete evaluation includes B-mode imaging, spectral Doppler, color Doppler, and power Doppler as needed of all accessible portions of each vessel. Bilateral testing is considered an integral part of a complete examination. Limited examinations for reoccurring indications may be performed as noted. The reflux portion of the exam is performed with the patient in reverse Trendelenburg.  +---------+---------------+---------+-----------+----------+--------------+ RIGHT    CompressibilityPhasicitySpontaneityPropertiesThrombus Aging +---------+---------------+---------+-----------+----------+--------------+ CFV      Full           Yes      Yes                                 +---------+---------------+---------+-----------+----------+--------------+ SFJ      Full                                                        +---------+---------------+---------+-----------+----------+--------------+ FV Prox  Full           Yes      Yes                                 +---------+---------------+---------+-----------+----------+--------------+ FV Mid   Full           Yes      Yes                                  +---------+---------------+---------+-----------+----------+--------------+ FV DistalFull           Yes      Yes                                 +---------+---------------+---------+-----------+----------+--------------+ PFV      Full                                                        +---------+---------------+---------+-----------+----------+--------------+ POP      Full           Yes      Yes                                 +---------+---------------+---------+-----------+----------+--------------+  PTV      Full                                                        +---------+---------------+---------+-----------+----------+--------------+ PERO     Full                                                        +---------+---------------+---------+-----------+----------+--------------+   +---------+---------------+---------+-----------+----------+--------------+ LEFT     CompressibilityPhasicitySpontaneityPropertiesThrombus Aging +---------+---------------+---------+-----------+----------+--------------+ CFV      Full           Yes      Yes                                 +---------+---------------+---------+-----------+----------+--------------+ SFJ      Full                                                        +---------+---------------+---------+-----------+----------+--------------+ FV Prox  Full           Yes      Yes                                 +---------+---------------+---------+-----------+----------+--------------+ FV Mid   Full           Yes      Yes                                 +---------+---------------+---------+-----------+----------+--------------+ FV DistalFull           Yes      Yes                                 +---------+---------------+---------+-----------+----------+--------------+ PFV      Full                                                         +---------+---------------+---------+-----------+----------+--------------+ POP      Full           Yes      Yes                                 +---------+---------------+---------+-----------+----------+--------------+ PTV      Full                                                        +---------+---------------+---------+-----------+----------+--------------+  PERO     Full                                                        +---------+---------------+---------+-----------+----------+--------------+     Summary: BILATERAL: - No evidence of deep vein thrombosis seen in the lower extremities, bilaterally. -No evidence of popliteal cyst, bilaterally.   *See table(s) above for measurements and observations. Electronically signed by Debby Robertson on 04/01/2024 at 4:50:42 PM.    Final    ECHOCARDIOGRAM COMPLETE Result Date: 04/01/2024    ECHOCARDIOGRAM REPORT   Patient Name:   MARVALENE BARRETT [facility] Date of Exam: 04/01/2024 Medical Rec #:  [id number]    Height:       66.0 in Accession #:    [id number]   Weight:       150.0 lb Date of Birth:  06/09/1945     BSA:          1.770 m Patient Age:    78 years     BP:           170/90 mmHg Patient Gender: F            HR:           90 bpm. Exam Location:  Inpatient Procedure: 2D Echo, Cardiac Doppler and Color Doppler (Both Spectral and Color            Flow Doppler were utilized during procedure). Indications:    Sroke 163.9  History:        Patient has prior history of Echocardiogram examinations, most                 recent 11/16/2016. CAD; Risk Factors:Dyslipidemia.  Sonographer:    Merlynn Argyle Referring Phys: [id number] [facility] IMPRESSIONS  1. Left ventricular ejection fraction, by estimation, is 60 to 65%. The left ventricle has normal function. The left ventricle has no regional wall motion abnormalities. Left ventricular diastolic parameters are consistent with Grade I diastolic dysfunction (impaired relaxation).  2. Right ventricular systolic  function is normal. The right ventricular size is normal. There is normal pulmonary artery systolic pressure.  3. The mitral valve is normal in structure. No evidence of mitral valve regurgitation. No evidence of mitral stenosis.  4. The aortic valve is normal in structure. Aortic valve regurgitation is not visualized. No aortic stenosis is present.  5. The inferior vena cava is normal in size with greater than 50% respiratory variability, suggesting right atrial pressure of 3 mmHg. FINDINGS  Left Ventricle: Left ventricular ejection fraction, by estimation, is 60 to 65%. The left ventricle has normal function. The left ventricle has no regional wall motion abnormalities. The left ventricular internal cavity size was normal in size. There is  no left ventricular hypertrophy. Left ventricular diastolic parameters are consistent with Grade I diastolic dysfunction (impaired relaxation). Right Ventricle: The right ventricular size is normal. No increase in right ventricular wall thickness. Right ventricular systolic function is normal. There is normal pulmonary artery systolic pressure. The tricuspid regurgitant velocity is 2.70 m/s, and  with an assumed right atrial pressure of 3 mmHg, the estimated right ventricular systolic pressure is 32.2 mmHg. Left Atrium: Left atrial size was normal in size. Right Atrium: Right atrial size was normal in size. Pericardium: There is no evidence of pericardial effusion. Mitral Valve: The  mitral valve is normal in structure. No evidence of mitral valve regurgitation. No evidence of mitral valve stenosis. Tricuspid Valve: The tricuspid valve is normal in structure. Tricuspid valve regurgitation is mild . No evidence of tricuspid stenosis. Aortic Valve: The aortic valve is normal in structure. Aortic valve regurgitation is not visualized. No aortic stenosis is present. Pulmonic Valve: The pulmonic valve was normal in structure. Pulmonic valve regurgitation is not visualized. No  evidence of pulmonic stenosis. Aorta: The aortic root is normal in size and structure. Venous: The inferior vena cava is normal in size with greater than 50% respiratory variability, suggesting right atrial pressure of 3 mmHg. IAS/Shunts: No atrial level shunt detected by color flow Doppler.  LEFT VENTRICLE PLAX 2D LVIDd:         3.60 cm   Diastology LVIDs:         2.40 cm   LV e' medial:    6.42 cm/s LV PW:         1.00 cm   LV E/e' medial:  13.8 LV IVS:        1.00 cm   LV e' lateral:   7.51 cm/s LVOT diam:     1.70 cm   LV E/e' lateral: 11.8 LV SV:         37 LV SV Index:   21 LVOT Area:     2.27 cm LV IVRT:       116 msec  RIGHT VENTRICLE             IVC RV Basal diam:  3.40 cm     IVC diam: 1.70 cm RV S prime:     13.20 cm/s TAPSE (M-mode): 1.7 cm      PULMONARY VEINS                             Diastolic Velocity: 33.80 cm/s                             S/D Velocity:       1.50                             Systolic Velocity:  51.80 cm/s LEFT ATRIUM             Index        RIGHT ATRIUM           Index LA diam:        2.80 cm 1.58 cm/m   RA Area:     16.90 cm LA Vol (A2C):   58.4 ml 33.00 ml/m  RA Volume:   43.40 ml  24.52 ml/m LA Vol (A4C):   47.1 ml 26.62 ml/m LA Biplane Vol: 53.3 ml 30.12 ml/m  AORTIC VALVE LVOT Vmax:   81.10 cm/s LVOT Vmean:  60.900 cm/s LVOT VTI:    0.163 m  AORTA Ao Root diam: 3.00 cm Ao Asc diam:  3.20 cm MITRAL VALVE                TRICUSPID VALVE MV Area (PHT): 2.08 cm     TR Peak grad:   29.2 mmHg MV Decel Time: 365 msec     TR Vmax:        270.00 cm/s MV E velocity: 88.70 cm/s MV A velocity: 109.00 cm/s  SHUNTS MV E/A ratio:  0.81  Systemic VTI:  0.16 m                             Systemic Diam: 1.70 cm Morene Brownie Electronically signed by Morene Brownie Signature Date/Time: 04/01/2024/4:26:10 PM    Final    MR BRAIN WO CONTRAST Result Date: 04/01/2024 EXAM: MRI BRAIN WITHOUT CONTRAST 04/01/2024 05:19:14 AM TECHNIQUE: Multiplanar multisequence MRI of the  head/brain was performed without the administration of intravenous contrast. COMPARISON: CT of the head dated 03/31/2024. CLINICAL HISTORY: Neuro deficit, acute, stroke suspected. FINDINGS: BRAIN AND VENTRICLES: There are numerous foci of restricted diffusion present within the subcortical and periventricular white matter of the right frontal and parietal lobes. There are also a few punctate foci of probable restricted diffusion in the left occipital lobe and posteromedially within the right cerebellar hemisphere. There is age-related cerebral volume loss. There are confluence zones of increased T2 signal in the periventricular and deep cerebral white matter. No intracranial hemorrhage. No mass. No midline shift. No hydrocephalus. The sella is unremarkable. Normal flow voids. ORBITS: The patient is status post bilateral lens replacement. SINUSES AND MASTOIDS: There is moderate mucosal disease within the ethmoid and sphenoid sinuses. BONES AND SOFT TISSUES: Normal marrow signal. No acute soft tissue abnormality. IMPRESSION: 1. Numerous foci of restricted diffusion in the subcortical and periventricular white matter of the right frontal and parietal lobes, consistent with acute infarcts. 2. Additional punctate foci of probable restricted diffusion in the left occipital lobe and posteromedially within the right cerebellar hemisphere. 3. No evidence of hemorrhage. Electronically signed by: Evalene Coho MD 04/01/2024 05:42 AM EST RP Workstation: [id number]   DG Knee Complete 4 Views Right Result Date: 04/01/2024 EXAM: 4 VIEW(S) XRAY OF THE RIGHT KNEE 04/01/2024 02:34:46 AM COMPARISON: None available. CLINICAL HISTORY: fall FINDINGS: BONES AND JOINTS: No acute fracture. No focal osseous lesion. No joint dislocation. Small suprapatellar knee joint effusion. Mild tricompartmental degenerative changes, most prominent in the lateral and patellofemoral compartments. SOFT TISSUES: The soft tissues are unremarkable.  IMPRESSION: 1. No acute fracture or dislocation. 2. Mild degenerative changes. 3. Small suprapatellar knee joint effusion. Electronically signed by: Pinkie Pebbles MD 04/01/2024 02:38 AM EST RP Workstation: [id number]   DG Hip Unilat W or Wo Pelvis 2-3 Views Right Result Date: 04/01/2024 EXAM: 3 VIEW(S) XRAY OF THE RIGHT HIP 04/01/2024 02:34:46 AM COMPARISON: None available. CLINICAL HISTORY: fall FINDINGS: BONES AND JOINTS: No acute fracture or focal osseous lesion. The hip joint is maintained. Mild degenerative changes of the right hip. SOFT TISSUES: The soft tissues are unremarkable. IMPRESSION: 1. No acute fracture or dislocation. Electronically signed by: Pinkie Pebbles MD 04/01/2024 02:38 AM EST RP Workstation: [id number]   CT Head Wo Contrast Result Date: 03/31/2024 EXAM: CT HEAD WITHOUT CONTRAST 03/31/2024 08:31:01 PM TECHNIQUE: CT of the head was performed without the administration of intravenous contrast. Automated exposure control, iterative reconstruction, and/or weight based adjustment of the mA/kV was utilized to reduce the radiation dose to as low as reasonably achievable. COMPARISON: 03/15/2023 CLINICAL HISTORY: fall, hit back of head FINDINGS: BRAIN AND VENTRICLES: No acute hemorrhage. No evidence of acute infarct. No extra-axial collection. No mass effect or midline shift. Moderate patchy supratentorial white matter hypodensities. Mild global atrophy. ORBITS: No acute abnormality. SINUSES: Chronic opacification of left sphenoid sinus. Mild paranasal sinus mucosal thickening. SOFT TISSUES AND SKULL: No acute soft tissue abnormality. No skull fracture. IMPRESSION: 1. No acute intracranial abnormality related to the fall. 2. Moderate supratentorial  white matter hypodensities, likely chronic microvascular ischemic changes, and mild global atrophy. 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 Yes Co-Morbidities requiring supervision/potential complications:  -HTN -CAD -hypokalemia -UTI Due to bladder management, bowel management, safety, skin/wound care, disease management, medication administration, pain management, and patient education, does the patient require 24 hr/day rehab nursing? Yes Does the patient require coordinated care of a physician, rehab nurse, therapy disciplines of PT, OT, ?SLP to address physical and functional deficits in the context of the above medical diagnosis(es)? Yes Addressing deficits in the following areas: balance, endurance, locomotion, strength, transferring, bowel/bladder control, bathing, dressing, grooming, toileting, cognition, and psychosocial support Can the patient actively participate in an intensive therapy program of at least 3 hrs of therapy per day at least 5 days per week? Yes The potential for patient to make measurable gains while on inpatient rehab is excellent Anticipated functional outcomes upon discharge from inpatient rehab are modified independent  with PT, modified independent with OT, modified independent with SLP. Estimated rehab length of stay to reach the above functional goals is: 7-9 days Anticipated discharge destination: Home Overall Rehab/Functional Prognosis: excellent  POST ACUTE RECOMMENDATIONS: This patient's condition is appropriate for continued rehabilitative care in the following setting: CIR Patient has agreed to participate in recommended program. Yes and Potentially Note that insurance prior authorization may be required for reimbursement for recommended care.  Comment: My only concern is that it appears that she doesn't have any local friends or family who could help at home. Nor does  she have the resources to hire help. She would have to be completely independent upon discharge to home. Rehab Admissions Coordinator to follow up.     I have personally performed a face to face diagnostic evaluation of this patient. Additionally, I have examined the patient's medical record including any pertinent labs and radiographic images.    Thanks,  Arthea ONEIDA Gunther, MD 04/02/2024

## 2024-04-02 NOTE — Progress Notes (Signed)
 Inpatient Rehab Admissions Coordinator:   Stopped by to discuss CIR recommendations with patient.  When I explained the purpose of my visit she became emotional, and stated that she didn't know why there was such a rush to talk about these things and that she felt overwhelmed without time to process all that has happened to her.  I provided supportive listening, and assured her that, while things do move more quickly during hospital stays, we were not trying to force her out, simply to try and start working on a plan.  I also noted tangential conversation throughout my visit.  I will follow up with her tomorrow to further discuss rehab recommendations and try and flesh out some caregiver supports.    Reche Lowers, PT, DPT Admissions Coordinator [phone] 04/02/24 1:34 PM

## 2024-04-02 NOTE — Progress Notes (Signed)
 Progress Note   Patient: [name] FMW:[id number] DOB: 1945/09/10 DOA: 03/31/2024     1 DOS: the patient was seen and examined on 04/02/2024   Brief hospital course: 78 y.o. female with medical history significant of hypertension, hypothyroidism, CAD, anxiety, depression presents with multiple falls and difficulty walking.   She has experienced multiple falls, approximately five or six times over the last couple days, due to her knees not supporting her. The right leg is weaker at baseline that she reports is due to a fall back in January of this year when she fell trying to get out of her car due to the ice.   Recently, she has experienced increased difficulty with mobility, particularly when trying to get out of a recliner. She describes her legs as 'not substantial enough to hold me up,' leading to crawling on the floor to prevent falls. On Tuesday, she fell while trying to let her dog out, describing a sensation of moving in a circle, akin to being on a merry-go-round, and possibly feeling dizzy.   She reports a prolapsed bladder, which has been present for a couple of years, requiring her to manually reposition it. She experiences frequent urination, especially after taking Lasix , which she takes in the morning along with other medications.   She has no family in the area, as she was transferred to [facility] for work and has since retired. 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 All of her family members are deceased. No chest pain and heart racing. She is unsure if she hit her head during falls and is uncertain about dizziness. No discomfort with urination but has a prolapsed bladder.   In the emergency department patient was noted to be afebrile with heart rates elevated up to 103, blood pressures 146/92 to 171/90, and all other vital signs maintained.  Labs from 11/11 significant for potassium 3.3, BUN 19,  creatinine 1.49, and calcium 8.7.  Urinalysis positive for large leukocytes, positive nitrites, many bacteria, 11-20 RBCs/hpf, 6-10 squamous epithelial cells, and greater than 50 WBCs.  UDS positive for benzos.  CT scan of the head did not reveal any acute abnormality.  Subsequently MRI of the brain revealed numerous foci of restricted diffusion in the subcortical and periventricular white matter of the right frontal and parietal lobes consistent with acute infarcts.  Patient had been given Zofran, potassium chloride  40 mill equivalents, Ativan  1 mg p.o., and cephalexin .    Assessment and Plan: CVA Patient presents after having multiple falls.  Initial CT did not note any acute abnormality.  Patient was not a candidate for thrombolytics due to being outside the window.  MRI revealed numerous infarcts in the subcortical and periventricular white matter of the right frontal and parietal lobes consistent with acute infarcts. - Check CTA of the head and neck - 2d echo reviewed. Normal LVEF with no atrial level shunt - therapy recs for inpt rehab. CIR following - Aspirin  and Plavix recommended by Neurology x 3 mos then plavix alone - Recs for 30 day monitor at discharge   Bladder prolapse Urinary tract infection Present on admission.  Patient reports history of bladder prolapse.  Urinalysis positive for large leukocytes, positive nitrites, many bacteria, 11-20 RBCs/hpf, 6-10 squamous epithelial cells, and greater than 50 WBCs.  Patient had initially been started on  cephalexin . - Continued on Rocephin IV   Hypokalemia -replaced   Hyperlipidemia Lipid panel noted total cholesterol of 208  with LDL 136.  Patient unable to tolerate statins - Consider Leqvio or PCSK9 inhibitor when able   Hypothyroidism - Continue levothyroxine       Subjective: Without complaints this AM  Physical Exam: Vitals:   04/02/24 0000 04/02/24 0020 04/02/24 0731 04/02/24 1211  BP:  (!) 167/88 (!) 158/76 138/80   Pulse:  86 86 95  Resp:  14 16 18   Temp: 98.4 F (36.9 C) 98.4 F (36.9 C) 99 F (37.2 C) 98.2 F (36.8 C)  TempSrc: Oral Oral Oral Oral  SpO2:  97% 96% 99%  Weight:      Height:       General exam: Awake, laying in bed, in nad Respiratory system: Normal respiratory effort, no wheezing Cardiovascular system: regular rate, s1, s2 Gastrointestinal system: Soft, nondistended, positive BS Central nervous system: CN2-12 grossly intact, strength intact Extremities: Perfused, no clubbing Skin: Normal skin turgor, no notable skin lesions seen Psychiatry: Mood normal // no visual hallucinations   Data Reviewed:  Labs reviewed: Na 137, K 3.9, Cr 1.31, WBC 4.1, Hgb 10.8, Plts 185  Family Communication: Pt in room, family not at bedside  Disposition: Status is: Inpatient Remains inpatient appropriate because: Severity of illness  Planned Discharge Destination: Rehab    Author: Garnette Pelt, MD 04/02/2024 3:08 PM  For on call review [email or web address].

## 2024-04-02 NOTE — Evaluation (Signed)
 Occupational Therapy Evaluation Patient Details Name: [name] MRN: [id number] DOB: 29-Sep-1945 Today's Date: 04/02/2024   History of Present Illness   Pt is 78 year old presented to [facility] on  03/31/24 for multiple falls. 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 PMH - htn, hypothyroidism, depression     Clinical Impressions Pt received in chair on arrival, agreeable for OT evaluation. AOX4. PTA, pt was indep with ADLs, iADLs, and mobility. Lives alone, + driving. Today, she presents with generalized weakness and rather poor attention span. Very tangential and talkative. Suspect partially baseline. Emotionally labile at times, requiring frequent redirection. Functionally, she needed up to min A for LB ADLs and CGA for standing UB ADLs with cues for sequencing/attention. Ambulated in room with RW and min A, cues for safe approach to surfaces and to maintain safe proximity to AD. HR up to 130s with activity, asymptomatic; remains hypertensive but within parameters for pHTN. RN updated on pt status.   Pt is currently functioning below baseline and would benefit from ongoing acute OT services to progress towards safe discharge and to facilitate return to prior level of function. Current recommendation is high-intensity post-acute rehab (> 3 hours/day).     If plan is discharge home, recommend the following:   A little help with walking and/or transfers;A little help with bathing/dressing/bathroom;Assistance with cooking/housework;Direct supervision/assist for medications management;Direct supervision/assist for financial management;Assist for transportation;Help with stairs or ramp for entrance     Functional Status Assessment   Patient has had a recent decline in their functional status and demonstrates the ability to make significant improvements in function in a reasonable and predictable amount of time.     Equipment  Recommendations   Other (comment) (defer to next level of care)     Recommendations for Other Services   Rehab consult     Precautions/Restrictions   Precautions Precautions: Fall Recall of Precautions/Restrictions: Intact Restrictions Weight Bearing Restrictions Per Provider Order: No     Mobility Bed Mobility               General bed mobility comments: not assessed - pt OOB throughout session    Transfers Overall transfer level: Needs assistance Equipment used: Rolling walker (2 wheels) Transfers: Sit to/from Stand Sit to Stand: Min assist           General transfer comment: STS from chair with cues for hand placement      Balance Overall balance assessment: Needs assistance Sitting-balance support: No upper extremity supported, Feet supported Sitting balance-Leahy Scale: Good Sitting balance - Comments: seated unsupported in chair without LOB   Standing balance support: Bilateral upper extremity supported, During functional activity, Reliant on assistive device for balance Standing balance-Leahy Scale: Poor Standing balance comment: reliant on RW for stability, although pt with poor proximity to AD and nBOS                           ADL either performed or assessed with clinical judgement   ADL Overall ADL's : Needs assistance/impaired Eating/Feeding: Set up;Sitting   Grooming: Contact guard assist;Cueing for sequencing;Standing;Wash/dry hands;Wash/dry face Grooming Details (indicate cue type and reason): cued to maintain attention on task at hand         Upper Body Dressing : Contact guard assist   Lower Body Dressing: Minimal assistance   Toilet Transfer: Minimal assistance;Ambulation;Regular Toilet;BSC/3in1;Rolling walker (2 wheels) Toilet Transfer Details (indicate cue type and  reason): initially transferred to [facility] then able to ambulate to reg toilet with improved ability to have BM Toileting- Clothing Manipulation and  Hygiene: Contact guard assist;Sitting/lateral lean;Sit to/from stand Toileting - Clothing Manipulation Details (indicate cue type and reason): performed anterior/posterior peri care following toileting     Functional mobility during ADLs: Minimal assistance;Cueing for sequencing;Cueing for safety;Rolling walker (2 wheels) General ADL Comments: cues to maintain safe proximity to RW during mobility in room     Vision Baseline Vision/History: 1 Wears glasses Ability to See in Adequate Light: 0 Adequate Patient Visual Report: No change from baseline       Perception         Praxis         Pertinent Vitals/Pain Pain Assessment Pain Assessment: No/denies pain     Extremity/Trunk Assessment Upper Extremity Assessment Upper Extremity Assessment: Generalized weakness   Lower Extremity Assessment Lower Extremity Assessment: Generalized weakness   Cervical / Trunk Assessment Cervical / Trunk Assessment: Normal   Communication Communication Communication: Impaired Factors Affecting Communication: Hearing impaired   Cognition Arousal: Alert Behavior During Therapy: Lability Cognition: Cognition impaired     Awareness: Intellectual awareness impaired Memory impairment (select all impairments): Short-term memory, Working memory Attention impairment (select first level of impairment): Sustained attention Executive functioning impairment (select all impairments): Organization, Problem solving, Reasoning OT - Cognition Comments: extremely tangential; poor attention span, emotionally labile at times; needs frequent redirection; often loses train of thought                 Following commands: Impaired Following commands impaired: Follows multi-step commands with increased time     Cueing  General Comments   Cueing Techniques: Verbal cues;Visual cues  HR up to130s with mobility   Exercises     Shoulder Instructions      Home Living Family/patient expects to be  discharged to:: Private residence Living Arrangements: Alone   Type of Home: Apartment Home Access: Level entry     Home Layout: One level     Bathroom Shower/Tub: Tub/shower unit;Curtain   Firefighter: Standard     Home Equipment: Cane - single point;Shower seat;Toilet riser      Lives With: Alone    Prior Functioning/Environment Prior Level of Function : Independent/Modified Independent;Driving             Mobility Comments: No AD PTA ADLs Comments: indep    OT Problem List: Decreased strength;Decreased activity tolerance;Impaired balance (sitting and/or standing);Decreased cognition;Decreased safety awareness   OT Treatment/Interventions: Self-care/ADL training;Therapeutic activities;DME and/or AE instruction;Energy conservation;Cognitive remediation/compensation;Patient/family education;Balance training      OT Goals(Current goals can be found in the care plan section)   Acute Rehab OT Goals Patient Stated Goal: get out of here OT Goal Formulation: With patient Time For Goal Achievement: 04/16/24 Potential to Achieve Goals: Good   OT Frequency:  Min 2X/week    Co-evaluation              AM-PAC OT 6 Clicks Daily Activity     Outcome Measure Help from another person eating meals?: None Help from another person taking care of personal grooming?: A Little Help from another person toileting, which includes using toliet, bedpan, or urinal?: A Little Help from another person bathing (including washing, rinsing, drying)?: A Lot Help from another person to put on and taking off regular upper body clothing?: A Little Help from another person to put on and taking off regular lower body clothing?: A Lot 6 Click Score: 17  End of Session Equipment Utilized During Treatment: Rolling walker (2 wheels) Nurse Communication: Mobility status  Activity Tolerance: Patient tolerated treatment well Patient left: in chair;with call bell/phone within reach;with  chair alarm set  OT Visit Diagnosis: Unsteadiness on feet (R26.81);Muscle weakness (generalized) (M62.81);Other symptoms and signs involving cognitive function                Time: 8957-8878 OT Time Calculation (min): 39 min Charges:  OT General Charges $OT Visit: 1 Visit OT Evaluation $OT Eval Low Complexity: 1 Low OT Treatments $Self Care/Home Management : 8-22 mins  Diantha Paxson M. [place], OTR/L [facility] Acute Rehabilitation Services [phone] Secure Chat Preferred  Porfiria Heinrich 04/02/2024, 4:15 PM

## 2024-04-02 NOTE — Progress Notes (Signed)
 SLP Cancellation Note  Patient Details Name: [name] MRN: [id number] DOB: [birth year]   Cancelled treatment:       Reason Eval/Treat Not Completed: Patient at procedure or test/unavailable (off the unit for CT). Will f/u as able.    Damien Blumenthal, M.A., CCC-SLP Speech Language Pathology, Acute Rehabilitation Services  Secure Chat preferred [phone]  04/02/2024, 10:02 AM

## 2024-04-02 NOTE — Evaluation (Signed)
 Speech Language Pathology Evaluation Patient Details Name: [name] MRN: [id number] DOB: [birth year] Today's Date: 04/02/2024 Time: 8852-8792 SLP Time Calculation (min) (ACUTE ONLY): 20 min  Problem List:  Patient Active Problem List   Diagnosis Date Noted   CVA (cerebral vascular accident) (HCC) 04/01/2024   History of prolapse of bladder 04/01/2024   Malignant neoplasm of upper-inner quadrant of left breast in female, estrogen receptor positive (HCC) 11/26/2023   Breast nodule 11/19/2023   Piriformis syndrome 10/22/2023   Pain and swelling of right lower leg 08/29/2023   Bursitis of right hip 08/29/2023   Stress 08/21/2023   Hip pain, chronic 08/21/2023   Change in voice 03/14/2023   Diarrhea 08/22/2022   Hemorrhoids 08/22/2022   Hypertensive urgency 03/28/2022   Macular degeneration of left eye 12/21/2021   Statin intolerance 08/31/2021   Grief 07/04/2021   Dry skin 07/04/2021   Stage 3a chronic kidney disease (HCC) 06/06/2021   Family history of giardiasis 05/03/2021   UTI (urinary tract infection) 01/12/2021   Dog bite, hand, left, subsequent encounter 09/28/2020   Arthritis of left knee 07/13/2020   Pain and swelling of right knee 06/05/2020   Left knee pain 06/05/2020   Hypokalemia 05/18/2020   Rash 03/24/2020   Insomnia 02/25/2020   Traumatic hematoma of buttock 12/31/2019   Leg pain, medial, right 11/20/2019   Bladder prolapse, female, acquired 06/25/2019   Statin myopathy 04/18/2019   Dog bite of arm 02/15/2019   Right arm pain 02/06/2019   Atrophic vaginitis 10/20/2018   Urinary bladder stone 08/06/2018   Hematuria, gross 08/06/2018   Edema 04/21/2018   Herpes zoster 02/20/2018   Occipital neuralgia of left side 01/07/2018   Hormone replacement therapy (HRT) 07/25/2017   Hair loss 05/31/2017   Upper respiratory infection 05/01/2017   Achilles tendinitis 03/12/2017   Gout 01/09/2017   Shoulder effusion, right 12/28/2016   Neck stiffness 12/28/2016    Heel pain, chronic, left 12/11/2016   Calcaneal bursitis (heel), left 11/13/2016   Toenail avulsion, initial encounter 11/13/2016   Traumatic hematoma of foot, right, sequela 09/27/2016   Adnexal mass 01/04/2016   Right adrenal mass 12/29/2015   Cataract 10/10/2015   Abnormal abdominal x-ray 12/23/2014   Epigastric pain 11/07/2012   GERD (gastroesophageal reflux disease) 11/07/2012   Hot flash, menopausal 09/10/2012   Well adult exam 06/13/2011   Gait disorder 11/22/2009   PARESTHESIA 11/22/2009   BACK PAIN, LUMBAR 11/03/2009   Hypothyroidism 02/11/2007   Dyslipidemia 02/11/2007   Anxiety disorder 02/11/2007   Depression 02/11/2007   Essential hypertension 02/11/2007   Coronary artery disease involving native coronary artery with angina pectoris 02/11/2007   DE QUERVAIN'S TENOSYNOVITIS 02/11/2007   CAROTID BRUIT 02/11/2007   Past Medical History:  Past Medical History:  Diagnosis Date   Anxiety state, unspecified    Bronchitis, acute    Carotid bruit    Coronary atherosclerosis of unspecified type of vessel, native or graft    multiple angioplasy procedures in 90's/early 2000's   De Quervain's tenosynovitis    Depressive disorder, not elsewhere classified    Female bladder prolapse    Lichen 2011   Vaginal   Other and unspecified hyperlipidemia    Unspecified essential hypertension    Unspecified hypothyroidism    Past Surgical History:  Past Surgical History:  Procedure Laterality Date   MOUTH SURGERY     NOSE SURGERY  1999   TOTAL VAGINAL HYSTERECTOMY  1994   HPI:  78 yo presenting 11/11 for multiple  falls. MRI shows numerous foci of restricted diffusion in the subcortical and periventricular white matter of the R frontal and parietal lobes, consistent with acute infarcts. PMH: HTN, hypothyroidism, depression   Assessment / Plan / Recommendation Clinical Impression  Pt lives alone at baseline and is fully independent with management of her dog, medications, and  finances. She reports noticing some word-finding difficulty but otherwise denies acute changes. She exhibits paucity of speech and use of circumlocution, affecting fluency at the conversation level but scored WFL on all tasks related to naming and repetition. She also demonstrates deficits related to selective attention, awareness, and memory (this appears more so related to difficulty storing new information which may be representative of difficulty attending to the task). Ongoing SLP f/u may be beneficial given her prior level of independence. Will continue following for cognitive-linguistic interventions.     SLP Assessment  SLP Recommendation/Assessment: Patient needs continued Speech Language Pathology Services SLP Visit Diagnosis: Cognitive communication deficit (R41.841)     Assistance Recommended at Discharge  Intermittent Supervision/Assistance  Functional Status Assessment Patient has had a recent decline in their functional status and demonstrates the ability to make significant improvements in function in a reasonable and predictable amount of time.  Frequency and Duration min 2x/week  2 weeks      SLP Evaluation Cognition  Overall Cognitive Status: Impaired/Different from baseline Arousal/Alertness: Awake/alert Orientation Level: Oriented X4 Attention: Selective Selective Attention: Impaired Selective Attention Impairment: Verbal basic Memory: Impaired Memory Impairment: Storage deficit Awareness: Impaired Awareness Impairment: Emergent impairment Problem Solving: Impaired Problem Solving Impairment: Verbal basic;Functional basic       Comprehension  Auditory Comprehension Overall Auditory Comprehension: Appears within functional limits for tasks assessed    Expression Expression Primary Mode of Expression: Verbal Verbal Expression Overall Verbal Expression: Appears within functional limits for tasks assessed   Oral / Motor  Oral Motor/Sensory Function Overall  Oral Motor/Sensory Function: Mild impairment Facial ROM: Reduced left;Suspected CN VII (facial) dysfunction Facial Symmetry: Abnormal symmetry left;Suspected CN VII (facial) dysfunction Facial Strength: Reduced left;Suspected CN VII (facial) dysfunction Facial Sensation: Within Functional Limits Lingual ROM: Within Functional Limits Lingual Symmetry: Within Functional Limits Motor Speech Overall Motor Speech: Appears within functional limits for tasks assessed            Damien Blumenthal, M.A., CCC-SLP Speech Language Pathology, Acute Rehabilitation Services  Secure Chat preferred [phone]  04/02/2024, 1:12 PM

## 2024-04-02 NOTE — Telephone Encounter (Signed)
 Noted.  She was hospitalized with stroke symptoms.  Thanks

## 2024-04-02 NOTE — Progress Notes (Addendum)
 STROKE TEAM PROGRESS NOTE    INTERIM HISTORY/SUBJECTIVE  Patient remains hemodynamically stable and afebrile.  PT/OT are recommending transfer to CIR.  Patient does have multifocal intracranial stenosis, especially in the posterior circulation but would not wish to pursue interventions for this as she states she would like to focus on quality of life and not quantity.  She wishes to avoid all unnecessary medical interventions.  OBJECTIVE  CBC    Component Value Date/Time   WBC 4.1 04/02/2024 0435   RBC 3.36 (L) 04/02/2024 0435   HGB 10.8 (L) 04/02/2024 0435   HGB 12.6 11/27/2023 1223   HCT 32.0 (L) 04/02/2024 0435   PLT 185 04/02/2024 0435   PLT 258 11/27/2023 1223   MCV 95.2 04/02/2024 0435   MCH 32.1 04/02/2024 0435   MCHC 33.8 04/02/2024 0435   RDW 12.5 04/02/2024 0435   LYMPHSABS 1.4 04/01/2024 0634   MONOABS 0.5 04/01/2024 0634   EOSABS 0.0 04/01/2024 0634   BASOSABS 0.0 04/01/2024 0634    BMET    Component Value Date/Time   NA 137 04/02/2024 0435   NA 144 03/13/2022 1116   K 3.9 04/02/2024 0435   CL 105 04/02/2024 0435   CO2 22 04/02/2024 0435   GLUCOSE 90 04/02/2024 0435   BUN 9 04/02/2024 0435   BUN 15 03/13/2022 1116   CREATININE 1.31 (H) 04/02/2024 0435   CREATININE 1.46 (H) 11/27/2023 1223   CREATININE 1.20 (H) 12/29/2019 1012   CALCIUM 8.6 (L) 04/02/2024 0435   EGFR 46 (L) 03/13/2022 1116   GFRNONAA 41 (L) 04/02/2024 0435   GFRNONAA 36 (L) 11/27/2023 1223    IMAGING past 24 hours CT ANGIO HEAD NECK W WO CM Result Date: 04/02/2024 EXAM: CTA HEAD AND NECK WITH AND WITHOUT 04/02/2024 10:03:44 AM TECHNIQUE: CTA of the head and neck was performed with and without the administration of intravenous contrast. 75 mL of iohexol  (OMNIPAQUE ) 350 MG/ML injection was administered. Multiplanar 2D and/or 3D reformatted images are provided for review. Automated exposure control, iterative reconstruction, and/or weight based adjustment of the mA/kV was utilized to reduce  the radiation dose to as low as reasonably achievable. Stenosis of the internal carotid arteries measured using NASCET criteria. COMPARISON: None available CLINICAL HISTORY: Neuro deficit, acute, stroke suspected. FINDINGS: CTA NECK: AORTIC ARCH AND ARCH VESSELS: The aortic arch demonstrates mild-to-moderate calcific atheromatous disease. There is calcific plaque present within the origins of the brachiocephalic and left common carotid arteries. There is no flow-limiting stenosis. No significant stenosis of the subclavian arteries. No dissection or arterial injury. CERVICAL CAROTID ARTERIES: There is mild calcific plaque within the origins of the internal carotid arteries, with no significant stenosis. The cervical segments of the internal carotid arteries are mildly tortuous, but normal in caliber. No dissection or arterial injury. CERVICAL VERTEBRAL ARTERIES: There is occlusive or near occlusive stenosis of the distal V4 segments of both vertebral arteries. LUNGS AND MEDIASTINUM: Moderate central lobular emphysema noted in the lung apices. The mediastinum is unremarkable. SOFT TISSUES: No acute abnormality. BONES: No acute abnormality. CTA HEAD: ANTERIOR CIRCULATION: No significant stenosis of the internal carotid arteries. No significant stenosis of the anterior cerebral arteries. There is mild atheromatous irregularity of the distal M1 segments of the middle cerebral arteries bilaterally. There is also mild-to-moderate stenosis present within proximal M2 branches bilaterally. No aneurysm. POSTERIOR CIRCULATION: There is fetal origin of the right posterior cerebral artery. No significant stenosis of the posterior cerebral arteries. There is occlusive or near occlusive stenosis of the  basilar artery. 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 There is occlusive or near occlusive stenosis of the distal V4 segments of both vertebral arteries. No  aneurysm. OTHER: There is mild-to-moderate periventricular white matter disease. There is moderate opacification of the sphenoid sinuses, with dense debris on the left, suggesting chronic sinusitis. There is also moderate opacification of the ethmoid air cells, more pronounced on the right. There is focal occlusion of the right pericholecystal artery seen on images 70 and 71 of series 16. There is also mild-to-moderate stenosis of the left pericholecystal artery. No dural venous sinus thrombosis on this non-dedicated study. Findings were communicated to Dr. Vanessa at 10:33 AM 04/02/2024. IMPRESSION: 1. 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 Fetal origin of the right posterior cerebral artery 2. Focal occlusion of the right pericallosal artery and mild-to-moderate stenosis of the left pericallosal artery 3. Mild atheromatous irregularity of the distal M1 segments of the middle cerebral arteries bilaterally and mild-to-moderate stenosis within proximal M2 branches bilaterally 4. Moderate centrilobular emphysema in the lung apices. Given patient age 15 assumed if not provided, consider evaluation for low-dose CT lung cancer screening program due to emphysema as an independent risk factor 5. Critical results communicated to Dr. Vanessa at 10:33 AM on 04/02/2024 Electronically signed by: Evalene Coho MD 04/02/2024 10:36 AM EST RP Workstation: [id number]   ECHOCARDIOGRAM COMPLETE Result Date: 04/01/2024    ECHOCARDIOGRAM REPORT   Patient Name:   [name] [name] Date of Exam: 04/01/2024 Medical Rec #:  [id number]    Height:       66.0 in Accession #:    [id number]   Weight:       150.0 lb Date of Birth:  1946-03-12     BSA:          1.770 m Patient Age:    78 years     BP:           170/90 mmHg Patient Gender: F            HR:           90 bpm. Exam Location:   Inpatient Procedure: 2D Echo, Cardiac Doppler and Color Doppler (Both Spectral and Color            Flow Doppler were utilized during procedure). Indications:    Sroke 163.9  History:        Patient has prior history of Echocardiogram examinations, most                 recent 11/16/2016. CAD; Risk Factors:Dyslipidemia.  Sonographer:    Merlynn Argyle Referring Phys: [id number] [facility] IMPRESSIONS  1. Left ventricular ejection fraction, by estimation, is 60 to 65%. The left ventricle has normal function. The left ventricle has no regional wall motion abnormalities. Left ventricular diastolic parameters are consistent with Grade I diastolic dysfunction (impaired relaxation).  2. Right ventricular systolic function is normal. The right ventricular size is normal. There is normal pulmonary artery systolic pressure.  3. The mitral valve is normal in structure. No evidence of mitral valve regurgitation. No evidence of mitral stenosis.  4. The aortic valve is normal in structure. Aortic valve regurgitation is not visualized. No aortic stenosis is present.  5. The inferior vena cava is normal in size with greater than 50% respiratory variability,  suggesting right atrial pressure of 3 mmHg. FINDINGS  Left Ventricle: Left ventricular ejection fraction, by estimation, is 60 to 65%. The left ventricle has normal function. The left ventricle has no regional wall motion abnormalities. The left ventricular internal cavity size was normal in size. There is  no left ventricular hypertrophy. Left ventricular diastolic parameters are consistent with Grade I diastolic dysfunction (impaired relaxation). Right Ventricle: The right ventricular size is normal. No increase in right ventricular wall thickness. Right ventricular systolic function is normal. There is normal pulmonary artery systolic pressure. The tricuspid regurgitant velocity is 2.70 m/s, and  with an assumed right atrial pressure of 3 mmHg, the estimated right ventricular  systolic pressure is 32.2 mmHg. Left Atrium: Left atrial size was normal in size. Right Atrium: Right atrial size was normal in size. Pericardium: There is no evidence of pericardial effusion. Mitral Valve: The mitral valve is normal in structure. No evidence of mitral valve regurgitation. No evidence of mitral valve stenosis. Tricuspid Valve: The tricuspid valve is normal in structure. Tricuspid valve regurgitation is mild . No evidence of tricuspid stenosis. Aortic Valve: The aortic valve is normal in structure. Aortic valve regurgitation is not visualized. No aortic stenosis is present. Pulmonic Valve: The pulmonic valve was normal in structure. Pulmonic valve regurgitation is not visualized. No evidence of pulmonic stenosis. Aorta: The aortic root is normal in size and structure. Venous: The inferior vena cava is normal in size with greater than 50% respiratory variability, suggesting right atrial pressure of 3 mmHg. IAS/Shunts: No atrial level shunt detected by color flow Doppler.  LEFT VENTRICLE PLAX 2D LVIDd:         3.60 cm   Diastology LVIDs:         2.40 cm   LV e' medial:    6.42 cm/s LV PW:         1.00 cm   LV E/e' medial:  13.8 LV IVS:        1.00 cm   LV e' lateral:   7.51 cm/s LVOT diam:     1.70 cm   LV E/e' lateral: 11.8 LV SV:         37 LV SV Index:   21 LVOT Area:     2.27 cm LV IVRT:       116 msec  RIGHT VENTRICLE             IVC RV Basal diam:  3.40 cm     IVC diam: 1.70 cm RV S prime:     13.20 cm/s TAPSE (M-mode): 1.7 cm      PULMONARY VEINS                             Diastolic Velocity: 33.80 cm/s                             S/D Velocity:       1.50                             Systolic Velocity:  51.80 cm/s LEFT ATRIUM             Index        RIGHT ATRIUM           Index LA diam:        2.80 cm 1.58 cm/m  RA Area:     16.90 cm LA Vol (A2C):   58.4 ml 33.00 ml/m  RA Volume:   43.40 ml  24.52 ml/m LA Vol (A4C):   47.1 ml 26.62 ml/m LA Biplane Vol: 53.3 ml 30.12 ml/m  AORTIC VALVE  LVOT Vmax:   81.10 cm/s LVOT Vmean:  60.900 cm/s LVOT VTI:    0.163 m  AORTA Ao Root diam: 3.00 cm Ao Asc diam:  3.20 cm MITRAL VALVE                TRICUSPID VALVE MV Area (PHT): 2.08 cm     TR Peak grad:   29.2 mmHg MV Decel Time: 365 msec     TR Vmax:        270.00 cm/s MV E velocity: 88.70 cm/s MV A velocity: 109.00 cm/s  SHUNTS MV E/A ratio:  0.81         Systemic VTI:  0.16 m                             Systemic Diam: 1.70 cm [name] Electronically signed by [name] Signature Date/Time: 04/01/2024/4:26:10 PM    Final     Vitals:   04/02/24 0000 04/02/24 0020 04/02/24 0731 04/02/24 1211  BP:  (!) 167/88 (!) 158/76 138/80  Pulse:  86 86 95  Resp:  14 16 18   Temp: 98.4 F (36.9 C) 98.4 F (36.9 C) 99 F (37.2 C) 98.2 F (36.8 C)  TempSrc: Oral Oral Oral Oral  SpO2:  97% 96% 99%  Weight:      Height:         PHYSICAL EXAM General:  Alert, well-nourished, well-developed elderly patient in no acute distress Psych:  Mood and affect somewhat labile, tearful at times CV: Regular rate and rhythm on monitor Respiratory:  Regular, unlabored respirations on room air   NEURO:  Mental Status: AA&Ox3, patient is able to give clear and coherent history Speech/Language: speech is without dysarthria or aphasia.  Fluency, and comprehension intact.  Cranial Nerves:  II: PERRL.  III, IV, VI: EOMI. Eyelids elevate symmetrically.  VII: Face is symmetrical resting and smiling VIII: hearing intact to voice. IX, X: Phonation is normal.  XII: tongue is midline without fasciculations. Motor: 5/5 strength to all muscle groups tested.  Tone: is normal and bulk is normal Sensation- Intact to light touch bilaterally.  Gait- deferred    ASSESSMENT/PLAN  [name] is a 78 y.o. female with history of hypothyroidism, hypertension, hyperlipidemia and depression admitted after experiencing multiple falls at home.  Patient was found to have embolic appearing strokes on MRI which  are very small and unlikely to explain her falls.  NIH on Admission 0  Stroke: Multiple acute ischemic infarcts, etiology: Likely embolic, concerning for occult afib CT head No acute abnormality. Small vessel disease. Atrophy.  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 the distal basilar, fetal origin of right PCA, focal occlusion of right pericallosal artery and mild to moderate stenosis of left pericallosal artery, mild atheromatous irregularity of distal M1 segments of MCAs bilaterally and mild to moderate stenosis within proximal M2 branches MRI numerous foci of restricted diffusion and subcortical and periventricular white matter right frontal and parietal lobes, also in left occipital lobe and right cerebellar hemisphere 2D Echo EF 60  to 65%, grade 1 diastolic dysfunction, normal left atrial size, no atrial level shunt Bilateral lower extremity no DVT Recommend loop recorder or 30-day monitor at discharge, however, pt has concerns for out of pocket cost and wishes to focus only on interventions that increase quality of life at this time LDL 136 HgbA1c 5.3 UDS + benzo (home meds) VTE prophylaxis -Lovenox aspirin  81 mg daily prior to admission, now on aspirin  81 mg daily and clopidogrel 75 mg daily for 3 months and then plavix alone. Therapy recommendations:  Pending Disposition: Pending  Hypertension Home meds: Metoprolol  75 mg 3 times daily, nifedipine  60 mg daily Stable Long term BP goal normotensive  Hyperlipidemia Home meds: None LDL 136, goal < 70 No statin now due to previous intolerance to statin and zetia Consider Leqvio or PCSK9 inhibitor if patient medication assistance can be found  Other Stroke Risk Factors Advanced age Possible remote history of stroke, did not seek medical attention for this CAD with heart attack in 1993 (per  pt)  Other Active Problems Food insecurity-TOC consult placed Breast cancer stage I  Hospital day # 1  Patient seen by NP with MD, MD to edit note as needed. [name] Clint Kill , MSN, AGACNP-BC [facility] See Amion for schedule and pager information 04/02/2024 1:22 PM  ATTENDING NOTE: I reviewed above note and agree with the assessment and plan. Pt was seen and examined.   RN at bedside.  Patient sitting in chair, again stated that she has difficulty with finances at home, refused outpatient cardiac monitoring or cholesterol-lowering medications at may cost her out-of-pocket expense.  Will continue DAPT for 3 months and then Plavix alone.  PT and OT recommend CIR.  For detailed assessment and plan, please refer to above as I have made changes wherever appropriate.   Neurology will sign off. Please call with questions. Pt will follow up with stroke clinic NP at [facility] in about 4 weeks. Thanks for the consult.   Ary Cummins, MD PhD Stroke Neurology 04/02/2024 6:26 PM    To contact Stroke Continuity provider, please refer to [email or web address]. After hours, contact General Neurology

## 2024-04-03 DIAGNOSIS — I639 Cerebral infarction, unspecified: Secondary | ICD-10-CM | POA: Diagnosis not present

## 2024-04-03 DIAGNOSIS — I6340 Cerebral infarction due to embolism of unspecified cerebral artery: Secondary | ICD-10-CM | POA: Diagnosis not present

## 2024-04-03 DIAGNOSIS — S7001XA Contusion of right hip, initial encounter: Secondary | ICD-10-CM | POA: Diagnosis not present

## 2024-04-03 LAB — COMPREHENSIVE METABOLIC PANEL WITH GFR
ALT: 14 U/L (ref 0–44)
AST: 21 U/L (ref 15–41)
Albumin: 2.9 g/dL — ABNORMAL LOW (ref 3.5–5.0)
Alkaline Phosphatase: 58 U/L (ref 38–126)
Anion gap: 13 (ref 5–15)
BUN: 8 mg/dL (ref 8–23)
CO2: 19 mmol/L — ABNORMAL LOW (ref 22–32)
Calcium: 8.5 mg/dL — ABNORMAL LOW (ref 8.9–10.3)
Chloride: 107 mmol/L (ref 98–111)
Creatinine, Ser: 1.38 mg/dL — ABNORMAL HIGH (ref 0.44–1.00)
GFR, Estimated: 39 mL/min — ABNORMAL LOW (ref >60)
Glucose, Bld: 97 mg/dL (ref 70–99)
Potassium: 3.3 mmol/L — ABNORMAL LOW (ref 3.5–5.1)
Sodium: 139 mmol/L (ref 135–145)
Total Bilirubin: 0.5 mg/dL (ref 0.0–1.2)
Total Protein: 5.7 g/dL — ABNORMAL LOW (ref 6.5–8.1)

## 2024-04-03 LAB — CBC
HCT: 32.2 % — ABNORMAL LOW (ref 36.0–46.0)
Hemoglobin: 11 g/dL — ABNORMAL LOW (ref 12.0–15.0)
MCH: 32.1 pg (ref 26.0–34.0)
MCHC: 34.2 g/dL (ref 30.0–36.0)
MCV: 93.9 fL (ref 80.0–100.0)
Platelets: 207 K/uL (ref 150–400)
RBC: 3.43 MIL/uL — ABNORMAL LOW (ref 3.87–5.11)
RDW: 12.7 % (ref 11.5–15.5)
WBC: 3.6 K/uL — ABNORMAL LOW (ref 4.0–10.5)
nRBC: 0 % (ref 0.0–0.2)

## 2024-04-03 MED ORDER — POTASSIUM CHLORIDE CRYS ER 20 MEQ PO TBCR
60.0000 meq | EXTENDED_RELEASE_TABLET | Freq: Once | ORAL | Status: AC
  Administered 2024-04-03: 60 meq via ORAL
  Filled 2024-04-03: qty 3

## 2024-04-03 MED ORDER — CLORAZEPATE DIPOTASSIUM 7.5 MG PO TABS
15.0000 mg | ORAL_TABLET | Freq: Three times a day (TID) | ORAL | Status: DC | PRN
  Administered 2024-04-03 – 2024-04-07 (×6): 15 mg via ORAL
  Filled 2024-04-03 (×6): qty 4

## 2024-04-03 MED ORDER — LEVOTHYROXINE SODIUM 100 MCG PO TABS
100.0000 ug | ORAL_TABLET | Freq: Every day | ORAL | Status: DC
  Administered 2024-04-04 – 2024-04-07 (×4): 100 ug via ORAL
  Filled 2024-04-03 (×4): qty 1

## 2024-04-03 MED ORDER — HYDROXYZINE HCL 10 MG PO TABS: 10.0000 mg | ORAL_TABLET | Freq: Three times a day (TID) | ORAL | Status: DC | PRN

## 2024-04-03 MED ORDER — METOPROLOL TARTRATE 25 MG PO TABS
25.0000 mg | ORAL_TABLET | Freq: Two times a day (BID) | ORAL | Status: DC
  Administered 2024-04-03 (×2): 25 mg via ORAL
  Filled 2024-04-03 (×2): qty 1

## 2024-04-03 MED ORDER — METOPROLOL TARTRATE 12.5 MG HALF TABLET: 12.5000 mg | ORAL_TABLET | Freq: Two times a day (BID) | ORAL | Status: DC

## 2024-04-03 NOTE — Plan of Care (Signed)

## 2024-04-03 NOTE — Progress Notes (Addendum)
 Inpatient Rehab Coordinator Note:  I met with patient at bedside to discuss CIR recommendations and goals/expectations of CIR stay.  We reviewed 3 hrs/day of therapy, physician follow up, and average length of stay 2 weeks (dependent upon progress) with goals of modified independence.  Pt reiterates today that she doesn't have any family or friends to help at home and would need to be virtually independent.  She does have a phone number of a friend on her windowsill [place] [phone]) and is okay with me speaking to her so I will call this afternoon.   Of note: pt's granddaughter called when I was in her room and she does NOT want to be contacted by her daughter, granddaughter, or cousin.    1420: Left a HIPAA compliant voicemail for Tiffany.  Will follow.   1517: Tiffany returned my call.  She is patient's advertising account planner and unsure of any available assist at discharge.  I updated TOC.  I do not think we could offer her a bed at [facility].  Could look into other area AIRs to see if they could accept without caregiver support, or SNF.    Reche Lowers, PT, DPT Admissions Coordinator [phone] 04/03/24 1:38 PM

## 2024-04-03 NOTE — TOC Initial Note (Signed)
 Transition of Care [facility]) - Initial/Assessment Note    Patient Details  Name: [name] MRN: [id number] Date of Birth: [birth year]  Transition of Care [facility]) CM/SW Contact:    Landry DELENA Senters, RN Phone Number: 04/03/2024, 4:23 PM  Clinical Narrative:                  Patient comes from home alone, does not have continuous support systems in place. Patient has been recommended CIR vs SNF at hospital d/c. Patient does not have the needed support available to her after CIR d/c so SNF to be pursued.   CM will continue to follow.   Expected Discharge Plan: Skilled Nursing Facility Barriers to Discharge: Continued Medical Work up   Patient Goals and CMS Choice            Expected Discharge Plan and Services       Living arrangements for the past 2 months: Single Family Home                                      Prior Living Arrangements/Services Living arrangements for the past 2 months: Single Family Home Lives with:: Self Patient language and need for interpreter reviewed:: Yes Do you feel safe going back to the place where you live?: Yes      Need for Family Participation in Patient Care: Yes (Comment) Care giver support system in place?: No (comment)   Criminal Activity/Legal Involvement Pertinent to Current Situation/Hospitalization: No - Comment as needed  Activities of Daily Living   ADL Screening (condition at time of admission) Independently performs ADLs?: No Does the patient have a NEW difficulty with bathing/dressing/toileting/self-feeding that is expected to last >3 days?: Yes (Initiates electronic notice to provider for possible OT consult) Does the patient have a NEW difficulty with getting in/out of bed, walking, or climbing stairs that is expected to last >3 days?: Yes (Initiates electronic notice to provider for possible PT consult) Does the patient have a NEW difficulty with communication that is expected to last >3 days?: No Is the patient deaf  or have difficulty hearing?: Yes (HOH) Does the patient have difficulty seeing, even when wearing glasses/contacts?: Yes (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) Does the patient have difficulty concentrating, remembering, or making decisions?: No  Permission Sought/Granted                  Emotional Assessment Appearance:: Appears stated age Attitude/Demeanor/Rapport: Engaged, Suspicious Affect (typically observed): Calm Orientation: : Oriented to Self, Oriented to Place, Oriented to  Time, Oriented to Situation Alcohol / Substance Use: Not Applicable Psych Involvement: No (comment)  Admission diagnosis:  Hypokalemia [E87.6] CVA (cerebral vascular accident) (HCC) [I63.9] Renal insufficiency [N28.9] Contusion of right hip, initial encounter [S70.01XA] Fall at home, initial encounter [W19.CHERENE, Y92.009] Cerebrovascular accident (CVA), unspecified mechanism (HCC) [I63.9] Patient Active Problem List   Diagnosis Date Noted   CVA (cerebral vascular accident) (HCC) 04/01/2024   History of prolapse of bladder 04/01/2024   Malignant neoplasm of upper-inner quadrant of left breast in female, estrogen receptor positive (HCC) 11/26/2023   Breast nodule 11/19/2023   Piriformis syndrome 10/22/2023   Pain and swelling of right lower leg 08/29/2023   Bursitis of right hip 08/29/2023   Stress 08/21/2023   Hip pain, chronic 08/21/2023   Change in voice 03/14/2023   Diarrhea 08/22/2022   Hemorrhoids 08/22/2022   Hypertensive urgency  03/28/2022   Macular degeneration of left eye 12/21/2021   Statin intolerance 08/31/2021   Grief 07/04/2021   Dry skin 07/04/2021   Stage 3a chronic kidney disease (HCC) 06/06/2021   Family history of giardiasis 05/03/2021   UTI (urinary tract infection) 01/12/2021   Dog bite, hand, left, subsequent encounter 09/28/2020   Arthritis of left knee 07/13/2020   Pain and swelling of right knee 06/05/2020   Left knee pain 06/05/2020    Hypokalemia 05/18/2020   Rash 03/24/2020   Insomnia 02/25/2020   Traumatic hematoma of buttock 12/31/2019   Leg pain, medial, right 11/20/2019   Bladder prolapse, female, acquired 06/25/2019   Statin myopathy 04/18/2019   Dog bite of arm 02/15/2019   Right arm pain 02/06/2019   Atrophic vaginitis 10/20/2018   Urinary bladder stone 08/06/2018   Hematuria, gross 08/06/2018   Edema 04/21/2018   Herpes zoster 02/20/2018   Occipital neuralgia of left side 01/07/2018   Hormone replacement therapy (HRT) 07/25/2017   Hair loss 05/31/2017   Upper respiratory infection 05/01/2017   Achilles tendinitis 03/12/2017   Gout 01/09/2017   Shoulder effusion, right 12/28/2016   Neck stiffness 12/28/2016   Heel pain, chronic, left 12/11/2016   Calcaneal bursitis (heel), left 11/13/2016   Toenail avulsion, initial encounter 11/13/2016   Traumatic hematoma of foot, right, sequela 09/27/2016   Adnexal mass 01/04/2016   Right adrenal mass 12/29/2015   Cataract 10/10/2015   Abnormal abdominal x-ray 12/23/2014   Epigastric pain 11/07/2012   GERD (gastroesophageal reflux disease) 11/07/2012   Hot flash, menopausal 09/10/2012   Well adult exam 06/13/2011   Gait disorder 11/22/2009   PARESTHESIA 11/22/2009   BACK PAIN, LUMBAR 11/03/2009   Hypothyroidism 02/11/2007   Dyslipidemia 02/11/2007   Anxiety disorder 02/11/2007   Depression 02/11/2007   Essential hypertension 02/11/2007   Coronary artery disease involving native coronary artery with angina pectoris 02/11/2007   DE QUERVAIN'S TENOSYNOVITIS 02/11/2007   CAROTID BRUIT 02/11/2007   PCP:  Garald Karlynn GAILS, MD Pharmacy:   [facility] [address] [place] - [address] [place] [address] Phone: [phone] Fax: [phone]  [facility] [address] [place] - [address] [place] [address] Phone: [phone] Fax: [phone]  [facility]  [id number] [place], [place] - [address] [place] [address] Phone: [phone] Fax: [phone]     Social Drivers of Health (SDOH) Social History: SDOH Screenings   Food Insecurity: Food Insecurity Present (04/01/2024)  Housing: Low Risk  (04/01/2024)  Transportation Needs: No Transportation Needs (04/01/2024)  Utilities: Not At Risk (04/01/2024)  Depression (PHQ2-9): Medium Risk (12/10/2023)  Social Connections: Socially Isolated (04/01/2024)  Tobacco Use: Medium Risk (04/01/2024)   SDOH Interventions:     Readmission Risk Interventions     No data to display

## 2024-04-03 NOTE — [place] FL2 (Signed)
   MEDICAID FL2 LEVEL OF CARE FORM     IDENTIFICATION  Patient Name: [name] Birthdate: 03/01/1946 Sex: female Admission Date (Current Location): 03/31/2024  [facility] and [place] Number:  Producer, Television/film/video and Address:  [facility] [place]. [facility], [address], [place], [place] [address]      Provider Number: [id number]  Attending Physician Name and Address:  Cindy Garnette POUR, MD  Relative Name and Phone Number:       Current Level of Care: Hospital Recommended Level of Care: Skilled Nursing Facility Prior Approval Number:    Date Approved/Denied:   PASRR Number: [id number]  Discharge Plan: SNF    Current Diagnoses: Patient Active Problem List   Diagnosis Date Noted   CVA (cerebral vascular accident) (HCC) 04/01/2024   History of prolapse of bladder 04/01/2024   Malignant neoplasm of upper-inner quadrant of left breast in female, estrogen receptor positive (HCC) 11/26/2023   Breast nodule 11/19/2023   Piriformis syndrome 10/22/2023   Pain and swelling of right lower leg 08/29/2023   Bursitis of right hip 08/29/2023   Stress 08/21/2023   Hip pain, chronic 08/21/2023   Change in voice 03/14/2023   Diarrhea 08/22/2022   Hemorrhoids 08/22/2022   Hypertensive urgency 03/28/2022   Macular degeneration of left eye 12/21/2021   Statin intolerance 08/31/2021   Grief 07/04/2021   Dry skin 07/04/2021   Stage 3a chronic kidney disease (HCC) 06/06/2021   Family history of giardiasis 05/03/2021   UTI (urinary tract infection) 01/12/2021   Dog bite, hand, left, subsequent encounter 09/28/2020   Arthritis of left knee 07/13/2020   Pain and swelling of right knee 06/05/2020   Left knee pain 06/05/2020   Hypokalemia 05/18/2020   Rash 03/24/2020   Insomnia 02/25/2020   Traumatic hematoma of buttock 12/31/2019   Leg pain, medial, right 11/20/2019   Bladder prolapse, female, acquired 06/25/2019   Statin myopathy 04/18/2019   Dog bite of arm  02/15/2019   Right arm pain 02/06/2019   Atrophic vaginitis 10/20/2018   Urinary bladder stone 08/06/2018   Hematuria, gross 08/06/2018   Edema 04/21/2018   Herpes zoster 02/20/2018   Occipital neuralgia of left side 01/07/2018   Hormone replacement therapy (HRT) 07/25/2017   Hair loss 05/31/2017   Upper respiratory infection 05/01/2017   Achilles tendinitis 03/12/2017   Gout 01/09/2017   Shoulder effusion, right 12/28/2016   Neck stiffness 12/28/2016   Heel pain, chronic, left 12/11/2016   Calcaneal bursitis (heel), left 11/13/2016   Toenail avulsion, initial encounter 11/13/2016   Traumatic hematoma of foot, right, sequela 09/27/2016   Adnexal mass 01/04/2016   Right adrenal mass 12/29/2015   Cataract 10/10/2015   Abnormal abdominal x-ray 12/23/2014   Epigastric pain 11/07/2012   GERD (gastroesophageal reflux disease) 11/07/2012   Hot flash, menopausal 09/10/2012   Well adult exam 06/13/2011   Gait disorder 11/22/2009   PARESTHESIA 11/22/2009   BACK PAIN, LUMBAR 11/03/2009   Hypothyroidism 02/11/2007   Dyslipidemia 02/11/2007   Anxiety disorder 02/11/2007   Depression 02/11/2007   Essential hypertension 02/11/2007   Coronary artery disease involving native coronary artery with angina pectoris 02/11/2007   DE QUERVAIN'S TENOSYNOVITIS 02/11/2007   CAROTID BRUIT 02/11/2007    Orientation RESPIRATION BLADDER Height & Weight     Self, Time, Situation, Place  Normal Continent Weight: 150 lb (68 kg) Height:  5' 6 (167.6 cm)  BEHAVIORAL SYMPTOMS/MOOD NEUROLOGICAL BOWEL NUTRITION STATUS      Continent Diet (heart healthy)  AMBULATORY STATUS COMMUNICATION OF NEEDS Skin   Extensive Assist Verbally Normal                       Personal Care Assistance Level of Assistance  Bathing, Feeding, Dressing Bathing Assistance: Maximum assistance Feeding assistance: Independent Dressing Assistance: Maximum assistance     Functional Limitations Info  Sight, Hearing Sight  Info: Impaired Hearing Info: Impaired      SPECIAL CARE FACTORS FREQUENCY  PT (By licensed PT), OT (By licensed OT)     PT Frequency: 5x/wk OT Frequency: 5x/wk            Contractures Contractures Info: Not present    Additional Factors Info  Code Status, Allergies Code Status Info: Full Allergies Info: Epinephrine , Isosorbide Mononitrate, Lidocaine , Morphine, Propoxyphene N-acetaminophen, Alprazolam, Atorvastatin, Codeine, Ezetimibe, Prilosec (Omeprazole ), Rosuvastatin, Simvastatin, Allopurinol , Clonidine  Derivatives, Meloxicam , Metronidazole , Tetracycline, Triple Antibiotic (Bacitracin-neomycin-polymyxin), Amoxicillin , Ciprofloxacin, Diphenhydramine Hcl           Current Medications (04/03/2024):  This is the current hospital active medication list Current Facility-Administered Medications  Medication Dose Route Frequency Provider Last Rate Last Admin   acetaminophen (TYLENOL) tablet 650 mg  650 mg Oral Q6H PRN Smith, Rondell A, MD   650 mg at 04/03/24 1157   aspirin  EC tablet 81 mg  81 mg Oral Daily Khaliqdina, Salman, MD   81 mg at 04/03/24 0819   cefTRIAXone (ROCEPHIN) 1 g in sodium chloride 0.9 % 100 mL IVPB  1 g Intravenous Q24H Smith, Rondell A, MD 200 mL/hr at 04/02/24 2052 1 g at 04/02/24 2052   clopidogrel (PLAVIX) tablet 75 mg  75 mg Oral Daily de Clint Kill, Cortney E, NP   75 mg at 04/03/24 9180   clorazepate  (TRANXENE ) tablet 15 mg  15 mg Oral TID PRN Chiu, Stephen K, MD       enoxaparin (LOVENOX) injection 40 mg  40 mg Subcutaneous Daily Chen, Lydia D, RPH   40 mg at 04/03/24 0820   [START ON 04/04/2024] levothyroxine  (SYNTHROID ) tablet 100 mcg  100 mcg Oral Q0600 Cindy Garnette POUR, MD       lip balm (CARMEX) ointment 1 Application  1 Application Topical PRN Smith, Rondell A, MD       metoprolol  tartrate (LOPRESSOR ) tablet 25 mg  25 mg Oral BID Cindy Garnette POUR, MD   25 mg at 04/03/24 1419   senna-docusate (Senokot-S) tablet 1 tablet  1 tablet Oral QHS PRN Smith,  Rondell A, MD         Discharge Medications: Please see discharge summary for a list of discharge medications.  Relevant Imaging Results:  Relevant Lab Results:   Additional Information SS#: [id number]  Almarie CHRISTELLA Goodie, LCSW

## 2024-04-03 NOTE — Progress Notes (Signed)
 Progress Note   Patient: [name] FMW:[id number] DOB: 27-Jun-1945 DOA: 03/31/2024     2 DOS: the patient was seen and examined on 04/03/2024   Brief hospital course: 78 y.o. female with medical history significant of hypertension, hypothyroidism, CAD, anxiety, depression presents with multiple falls and difficulty walking.   She has experienced multiple falls, approximately five or six times over the last couple days, due to her knees not supporting her. The right leg is weaker at baseline that she reports is due to a fall back in January of this year when she fell trying to get out of her car due to the ice.   Recently, she has experienced increased difficulty with mobility, particularly when trying to get out of a recliner. She describes her legs as 'not substantial enough to hold me up,' leading to crawling on the floor to prevent falls. On Tuesday, she fell while trying to let her dog out, describing a sensation of moving in a circle, akin to being on a merry-go-round, and possibly feeling dizzy.   She reports a prolapsed bladder, which has been present for a couple of years, requiring her to manually reposition it. She experiences frequent urination, especially after taking Lasix , which she takes in the morning along with other medications.   She has no family in the area, as she was transferred to [employer] for work and has since retired. 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 All of her family members are deceased. No chest pain and heart racing. She is unsure if she hit her head during falls and is uncertain about dizziness. No discomfort with urination but has a prolapsed bladder.   In the emergency department patient was noted to be afebrile with heart rates elevated up to 103, blood pressures 146/92 to 171/90, and all other vital signs maintained.  Labs from 11/11 significant for potassium 3.3, BUN 19,  creatinine 1.49, and calcium 8.7.  Urinalysis positive for large leukocytes, positive nitrites, many bacteria, 11-20 RBCs/hpf, 6-10 squamous epithelial cells, and greater than 50 WBCs.  UDS positive for benzos.  CT scan of the head did not reveal any acute abnormality.  Subsequently MRI of the brain revealed numerous foci of restricted diffusion in the subcortical and periventricular white matter of the right frontal and parietal lobes consistent with acute infarcts.  Patient had been given Zofran, potassium chloride  40 mill equivalents, Ativan  1 mg p.o., and cephalexin .    Assessment and Plan: CVA Patient presents after having multiple falls.  Initial CT did not note any acute abnormality.  Patient was not a candidate for thrombolytics due to being outside the window.  MRI revealed numerous infarcts in the subcortical and periventricular white matter of the right frontal and parietal lobes consistent with acute infarcts. - Check CTA of the head and neck - 2d echo reviewed. Normal LVEF with no atrial level shunt - therapy recs for inpt rehab, pending dispo - Aspirin  and Plavix recommended by Neurology x 3 mos then plavix alone - Recs for 30 day monitor at discharge   Bladder prolapse Urinary tract infection Present on admission.  Patient reports history of bladder prolapse.  Urinalysis positive for large leukocytes, positive nitrites, many bacteria, 11-20 RBCs/hpf, 6-10 squamous epithelial cells, and greater than 50 WBCs.  Patient had initially been started on  cephalexin . - Continued on Rocephin IV   Hypokalemia -will replace   Hyperlipidemia Lipid panel noted total cholesterol of  208 with LDL 136.  Patient unable to tolerate statins - Consider Leqvio or PCSK9 inhibitor when able   Hypothyroidism - Continue levothyroxine   HTN -on lasix  40mg  qday, metoprolol  tartrate 75mg  TID, nifedipine  60mg  every day PTA -Gradually resume bp meds as tolerated to prevent hypotension -Will resume  metoprolol  25mg  BID  Sinus tach -reviewed and noted on EKG -Will resume home metoprolol  at lower dose to prevent hypotension      Subjective: No complaints this AM  Physical Exam: Vitals:   04/03/24 0346 04/03/24 0736 04/03/24 0820 04/03/24 1138  BP: (!) 155/87 (!) 174/84 (!) 149/87 (!) 175/90  Pulse: (!) 103 95  92  Resp: 16 20    Temp: 99 F (37.2 C) 98.8 F (37.1 C)  98.8 F (37.1 C)  TempSrc: Oral Oral  Oral  SpO2: 97% 95%  100%  Weight:      Height:       General exam: Conversant, in no acute distress Respiratory system: normal chest rise, clear, no audible wheezing Cardiovascular system: regular rhythm, s1-s2 Gastrointestinal system: Nondistended, nontender, pos BS Central nervous system: No seizures, no tremors Extremities: No cyanosis, no joint deformities Skin: No rashes, no pallor Psychiatry: Affect normal // no auditory hallucinations   Data Reviewed:  Labs reviewed: Na 139, K 3.3, Cr 1.38, WBC 3.6, Hgb 11.0, Plts 207  Family Communication: Pt in room, family not at bedside  Disposition: Status is: Inpatient Remains inpatient appropriate because: Severity of illness  Planned Discharge Destination: Rehab    Author: Garnette Pelt, MD 04/03/2024 3:23 PM  For on call review [email or web address].

## 2024-04-03 NOTE — Progress Notes (Signed)
 11/14-Patient seen for food insecurities. Patient is denying any issues obtaining food at home.

## 2024-04-03 NOTE — Care Management Important Message (Signed)
 Important Message  Patient Details  Name: [name] MRN: [id number] Date of Birth: Nov 15, 1945   Important Message Given:  Yes - Medicare IM     Claretta Deed 04/03/2024, 3:10 PM

## 2024-04-04 DIAGNOSIS — S7001XA Contusion of right hip, initial encounter: Secondary | ICD-10-CM | POA: Diagnosis not present

## 2024-04-04 DIAGNOSIS — I639 Cerebral infarction, unspecified: Secondary | ICD-10-CM | POA: Diagnosis not present

## 2024-04-04 DIAGNOSIS — I6340 Cerebral infarction due to embolism of unspecified cerebral artery: Secondary | ICD-10-CM | POA: Diagnosis not present

## 2024-04-04 LAB — CBC
HCT: 32.4 % — ABNORMAL LOW (ref 36.0–46.0)
Hemoglobin: 10.9 g/dL — ABNORMAL LOW (ref 12.0–15.0)
MCH: 32.1 pg (ref 26.0–34.0)
MCHC: 33.6 g/dL (ref 30.0–36.0)
MCV: 95.3 fL (ref 80.0–100.0)
Platelets: 236 K/uL (ref 150–400)
RBC: 3.4 MIL/uL — ABNORMAL LOW (ref 3.87–5.11)
RDW: 12.8 % (ref 11.5–15.5)
WBC: 5 K/uL (ref 4.0–10.5)
nRBC: 0 % (ref 0.0–0.2)

## 2024-04-04 LAB — COMPREHENSIVE METABOLIC PANEL WITH GFR
ALT: 13 U/L (ref 0–44)
AST: 19 U/L (ref 15–41)
Albumin: 2.9 g/dL — ABNORMAL LOW (ref 3.5–5.0)
Alkaline Phosphatase: 62 U/L (ref 38–126)
Anion gap: 12 (ref 5–15)
BUN: <5 mg/dL — ABNORMAL LOW (ref 8–23)
CO2: 20 mmol/L — ABNORMAL LOW (ref 22–32)
Calcium: 8.6 mg/dL — ABNORMAL LOW (ref 8.9–10.3)
Chloride: 108 mmol/L (ref 98–111)
Creatinine, Ser: 0.99 mg/dL (ref 0.44–1.00)
GFR, Estimated: 58 mL/min — ABNORMAL LOW (ref >60)
Glucose, Bld: 91 mg/dL (ref 70–99)
Potassium: 3.7 mmol/L (ref 3.5–5.1)
Sodium: 140 mmol/L (ref 135–145)
Total Bilirubin: 0.4 mg/dL (ref 0.0–1.2)
Total Protein: 5.8 g/dL — ABNORMAL LOW (ref 6.5–8.1)

## 2024-04-04 MED ORDER — NIFEDIPINE ER OSMOTIC RELEASE 30 MG PO TB24
30.0000 mg | ORAL_TABLET | Freq: Every day | ORAL | Status: DC
  Administered 2024-04-04 – 2024-04-05 (×2): 30 mg via ORAL
  Filled 2024-04-04 (×2): qty 1

## 2024-04-04 MED ORDER — HYDRALAZINE HCL 25 MG PO TABS
25.0000 mg | ORAL_TABLET | Freq: Three times a day (TID) | ORAL | Status: DC | PRN
  Administered 2024-04-04: 25 mg via ORAL
  Filled 2024-04-04: qty 1

## 2024-04-04 MED ORDER — PREDNISONE 20 MG PO TABS
60.0000 mg | ORAL_TABLET | Freq: Every day | ORAL | Status: AC
  Administered 2024-04-04 – 2024-04-05 (×2): 60 mg via ORAL
  Filled 2024-04-04 (×2): qty 3

## 2024-04-04 MED ORDER — METOPROLOL TARTRATE 50 MG PO TABS
50.0000 mg | ORAL_TABLET | Freq: Two times a day (BID) | ORAL | Status: DC
  Administered 2024-04-04 – 2024-04-07 (×7): 50 mg via ORAL
  Filled 2024-04-04 (×7): qty 1

## 2024-04-04 NOTE — Progress Notes (Signed)
 Progress Note   Patient: [name] FMW:[id number] DOB: Feb 07, 1946 DOA: 03/31/2024     3 DOS: the patient was seen and examined on 04/04/2024   Brief hospital course: 78 y.o. female with medical history significant of hypertension, hypothyroidism, CAD, anxiety, depression presents with multiple falls and difficulty walking.   She has experienced multiple falls, approximately five or six times over the last couple days, due to her knees not supporting her. The right leg is weaker at baseline that she reports is due to a fall back in January of this year when she fell trying to get out of her car due to the ice.   Recently, she has experienced increased difficulty with mobility, particularly when trying to get out of a recliner. She describes her legs as 'not substantial enough to hold me up,' leading to crawling on the floor to prevent falls. On Tuesday, she fell while trying to let her dog out, describing a sensation of moving in a circle, akin to being on a merry-go-round, and possibly feeling dizzy.   She reports a prolapsed bladder, which has been present for a couple of years, requiring her to manually reposition it. She experiences frequent urination, especially after taking Lasix , which she takes in the morning along with other medications.   She has no family in the area, as she was transferred to [employer] for work and has since retired. 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 All of her family members are deceased. No chest pain and heart racing. She is unsure if she hit her head during falls and is uncertain about dizziness. No discomfort with urination but has a prolapsed bladder.   In the emergency department patient was noted to be afebrile with heart rates elevated up to 103, blood pressures 146/92 to 171/90, and all other vital signs maintained.  Labs from 11/11 significant for potassium 3.3, BUN 19,  creatinine 1.49, and calcium 8.7.  Urinalysis positive for large leukocytes, positive nitrites, many bacteria, 11-20 RBCs/hpf, 6-10 squamous epithelial cells, and greater than 50 WBCs.  UDS positive for benzos.  CT scan of the head did not reveal any acute abnormality.  Subsequently MRI of the brain revealed numerous foci of restricted diffusion in the subcortical and periventricular white matter of the right frontal and parietal lobes consistent with acute infarcts.  Patient had been given Zofran, potassium chloride  40 mill equivalents, Ativan  1 mg p.o., and cephalexin .    Assessment and Plan: CVA Patient presents after having multiple falls.  Initial CT did not note any acute abnormality.  Patient was not a candidate for thrombolytics due to being outside the window.  MRI revealed numerous infarcts in the subcortical and periventricular white matter of the right frontal and parietal lobes consistent with acute infarcts. - Check CTA of the head and neck - 2d echo reviewed. Normal LVEF with no atrial level shunt - therapy recs for inpt rehab, pending dispo - Aspirin  and Plavix recommended by Neurology x 3 mos then plavix alone - Recs for 30 day monitor at discharge   Bladder prolapse Urinary tract infection Present on admission.  Patient reports history of bladder prolapse.  Urinalysis positive for large leukocytes, positive nitrites, many bacteria, 11-20 RBCs/hpf, 6-10 squamous epithelial cells, and greater than 50 WBCs.  Patient had initially been started on  cephalexin . - Will plan to complete empiric Rocephin IV   Hypokalemia -normalized   Hyperlipidemia Lipid panel noted total  cholesterol of 208 with LDL 136.  Patient unable to tolerate statins - Consider Leqvio or PCSK9 inhibitor when able   Hypothyroidism - Continue levothyroxine   HTN -on lasix  40mg  qday, metoprolol  tartrate 75mg  TID, nifedipine  60mg  every day PTA -Gradually resume bp meds as tolerated to prevent  hypotension -Will resume metoprolol  25mg  BID  Sinus tach -reviewed and noted on EKG -Will resume home metoprolol  at lower dose to prevent hypotension      Subjective: Reports sleeping better and feeling better once pt's home anxiolytics were resumed  Physical Exam: Vitals:   04/04/24 0103 04/04/24 0407 04/04/24 0816 04/04/24 1142  BP: (!) 181/73 (!) 185/72 (!) 158/93 (!) 148/80  Pulse: 67 86 100 77  Resp:  18 18   Temp:  (!) 97.5 F (36.4 C)  99.6 F (37.6 C)  TempSrc:  Oral  Oral  SpO2:  99% 98% 100%  Weight:      Height:       General exam: Awake, sitting in chair, in nad Respiratory system: Normal respiratory effort, no wheezing Cardiovascular system: regular rate, s1, s2 Gastrointestinal system: Soft, nondistended, positive BS Central nervous system: CN2-12 grossly intact, strength intact Extremities: Perfused, no clubbing Skin: Normal skin turgor, no notable skin lesions seen Psychiatry: Mood normal // affect normal  Data Reviewed:  Labs reviewed: Na 140, K 3.7, Cr 0.99, WBC 5.0, Hgb 10.9, Plts 236  Family Communication: Pt in room, family not at bedside  Disposition: Status is: Inpatient Remains inpatient appropriate because: Severity of illness  Planned Discharge Destination: Rehab    Author: Garnette Pelt, MD 04/04/2024 3:11 PM  For on call review [email or web address].

## 2024-04-04 NOTE — Progress Notes (Signed)
   04/04/24 0037  Vitals  BP (!) 185/76   Dr. Charlton aware.

## 2024-04-04 NOTE — Progress Notes (Signed)
   04/04/24 0407  Vitals  BP (!) 185/72   Dr. Charlton notified.

## 2024-04-04 NOTE — Progress Notes (Signed)
 OT Cancellation Note  Patient Details Name: MAELLE SHEAFFER MRN: [id number] DOB: 1946-01-28   Cancelled Treatment:    Reason Eval/Treat Not Completed: Other (comment).  Attempted skilled OT session.  Rn requesting to allow pt. To sleep now and try back later as she was not able to get any rest last night. Will check back as schedule allows.    CHRISTELLA Nest Lorraine-COTA/L  04/04/2024, 8:39 AM

## 2024-04-04 NOTE — Progress Notes (Signed)
 Occupational Therapy Treatment Patient Details Name: [name] MRN: [id number] DOB: [birth year] Today's Date: 04/04/2024   History of present illness Pt is 78 year old presented to [facility] on  03/31/24 for multiple falls. 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 PMH - htn, hypothyroidism, depression   OT comments  Pt. Seen for skilled OT treatment session.  Pt. Able to complete in room ambulation to/from b.room and sink with CGA and heavy cues for RW management/safety during ambulation.  Max cues for re-direction to task from self distraction of conversation, often stopping current task when conversation begins.  Min A for sit/stand with MOD A from toilet secondary to lower surface.  Pt. Reports having a toilet riser on her toilet at home.  Cont. With current d/c recommendations and acute OT POC.        If plan is discharge home, recommend the following:  A little help with walking and/or transfers;A little help with bathing/dressing/bathroom;Assistance with cooking/housework;Direct supervision/assist for medications management;Direct supervision/assist for financial management;Assist for transportation;Help with stairs or ramp for entrance   Equipment Recommendations       Recommendations for Other Services Rehab consult    Precautions / Restrictions Precautions Precautions: Fall Recall of Precautions/Restrictions: Intact       Mobility Bed Mobility               General bed mobility comments: pt. seated in recliner at beg. and end of session    Transfers Overall transfer level: Needs assistance Equipment used: Rolling walker (2 wheels) Transfers: Sit to/from Stand, Bed to chair/wheelchair/BSC Sit to Stand: Min assist, Mod assist     Step pivot transfers: Min assist     General transfer comment: STS from chair with cues for hand placement-heavier support with sit/stand from lower surface     Balance                                            ADL either performed or assessed with clinical judgement   ADL Overall ADL's : Needs assistance/impaired     Grooming: Contact guard assist;Standing;Cueing for sequencing Grooming Details (indicate cue type and reason): how do you turn this off and took several attempts to figure how to turn it off                 Toilet Transfer: Minimal assistance;Ambulation;Regular Toilet;Rolling walker (2 wheels) Toilet Transfer Details (indicate cue type and reason): reports difficulty sit/stand, stand/sit from reg/lower height toilet.  reports she has a toilet riser on her toilet at home. Toileting- Clothing Manipulation and Hygiene: Contact guard assist;Sitting/lateral lean;Sit to/from stand Toileting - Clothing Manipulation Details (indicate cue type and reason): performed anterior/posterior peri care following toileting and managed undergarments     Functional mobility during ADLs: Minimal assistance;Cueing for sequencing;Cueing for safety;Rolling walker (2 wheels) General ADL Comments: cues to maintain safe proximity to RW during mobility in room    Extremity/Trunk Assessment              Vision       Perception     Praxis     Communication Communication Communication: Impaired Factors Affecting Communication: Hearing impaired   Cognition Arousal: Alert Behavior During Therapy: Lability Cognition: Cognition impaired     Awareness: Intellectual awareness impaired Memory impairment (select all impairments): Short-term memory, Working memory Attention impairment (  select first level of impairment): Sustained attention Executive functioning impairment (select all impairments): Organization, Problem solving, Reasoning OT - Cognition Comments: extremely tangential; poor attention span, emotionally labile at times; needs frequent redirection; often loses train of thought                 Following commands:  Impaired Following commands impaired: Follows multi-step commands with increased time      Cueing   Cueing Techniques: Verbal cues, Visual cues  Exercises      Shoulder Instructions       General Comments      Pertinent Vitals/ Pain       Pain Assessment Pain Assessment: Faces Faces Pain Scale: Hurts a little bit Pain Location: states i have all these bruises on my back Pain Descriptors / Indicators: Aching Pain Intervention(s): Repositioned, Limited activity within patient's tolerance  Home Living                                          Prior Functioning/Environment              Frequency  Min 2X/week        Progress Toward Goals  OT Goals(current goals can now be found in the care plan section)  Progress towards OT goals: Progressing toward goals     Plan      Co-evaluation                 AM-PAC OT 6 Clicks Daily Activity     Outcome Measure   Help from another person eating meals?: None Help from another person taking care of personal grooming?: A Little Help from another person toileting, which includes using toliet, bedpan, or urinal?: A Little Help from another person bathing (including washing, rinsing, drying)?: A Lot Help from another person to put on and taking off regular upper body clothing?: A Little Help from another person to put on and taking off regular lower body clothing?: A Lot 6 Click Score: 17    End of Session Equipment Utilized During Treatment: Rolling walker (2 wheels);Gait belt  OT Visit Diagnosis: Unsteadiness on feet (R26.81);Muscle weakness (generalized) (M62.81);Other symptoms and signs involving cognitive function   Activity Tolerance Patient tolerated treatment well   Patient Left in chair;with call bell/phone within reach;with chair alarm set   Nurse Communication Other (comment) (rn states ok to work with pt.)        Time: 1240-1255 OT Time Calculation (min): 15  min  Charges: OT General Charges $OT Visit: 1 Visit OT Treatments $Self Care/Home Management : 8-22 mins  Randall, COTA/L Acute Rehabilitation [phone]   CHRISTELLA Nest Lorraine-COTA/L  04/04/2024, 1:22 PM

## 2024-04-05 DIAGNOSIS — I6340 Cerebral infarction due to embolism of unspecified cerebral artery: Secondary | ICD-10-CM | POA: Diagnosis not present

## 2024-04-05 DIAGNOSIS — S7001XA Contusion of right hip, initial encounter: Secondary | ICD-10-CM | POA: Diagnosis not present

## 2024-04-05 DIAGNOSIS — I639 Cerebral infarction, unspecified: Secondary | ICD-10-CM | POA: Diagnosis not present

## 2024-04-05 MED ORDER — NIFEDIPINE ER OSMOTIC RELEASE 30 MG PO TB24
60.0000 mg | ORAL_TABLET | Freq: Every day | ORAL | Status: DC
  Administered 2024-04-06 – 2024-04-07 (×2): 60 mg via ORAL
  Filled 2024-04-05 (×2): qty 2

## 2024-04-05 NOTE — Progress Notes (Signed)
 PT Cancellation Note  Patient Details Name: [name] MRN: [id number] DOB: 1946-04-15   Cancelled Treatment:    Reason Eval/Treat Not Completed: Other (comment) Pt received up in chair; reports she is too flustered to participate in physical therapy due to evolving family dynamics and having a visitor coming to see her.   Aleck Daring, PT, DPT Acute Rehabilitation Services Office [phone]    Aleck ONEIDA Daring 04/05/2024, 4:13 PM

## 2024-04-05 NOTE — Progress Notes (Signed)
 Progress Note   Patient: [name] FMW:[id number] DOB: 19-Apr-1946 DOA: 03/31/2024     4 DOS: the patient was seen and examined on 04/05/2024   Brief hospital course: 78 y.o. female with medical history significant of hypertension, hypothyroidism, CAD, anxiety, depression presents with multiple falls and difficulty walking.   She has experienced multiple falls, approximately five or six times over the last couple days, due to her knees not supporting her. The right leg is weaker at baseline that she reports is due to a fall back in January of this year when she fell trying to get out of her car due to the ice.   Recently, she has experienced increased difficulty with mobility, particularly when trying to get out of a recliner. She describes her legs as 'not substantial enough to hold me up,' leading to crawling on the floor to prevent falls. On Tuesday, she fell while trying to let her dog out, describing a sensation of moving in a circle, akin to being on a merry-go-round, and possibly feeling dizzy.   She reports a prolapsed bladder, which has been present for a couple of years, requiring her to manually reposition it. She experiences frequent urination, especially after taking Lasix , which she takes in the morning along with other medications.   She has no family in the area, as she was transferred to [employer] for work and has since retired. 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 All of her family members are deceased. No chest pain and heart racing. She is unsure if she hit her head during falls and is uncertain about dizziness. No discomfort with urination but has a prolapsed bladder.   In the emergency department patient was noted to be afebrile with heart rates elevated up to 103, blood pressures 146/92 to 171/90, and all other vital signs maintained.  Labs from 11/11 significant for potassium 3.3, BUN 19,  creatinine 1.49, and calcium 8.7.  Urinalysis positive for large leukocytes, positive nitrites, many bacteria, 11-20 RBCs/hpf, 6-10 squamous epithelial cells, and greater than 50 WBCs.  UDS positive for benzos.  CT scan of the head did not reveal any acute abnormality.  Subsequently MRI of the brain revealed numerous foci of restricted diffusion in the subcortical and periventricular white matter of the right frontal and parietal lobes consistent with acute infarcts.  Patient had been given Zofran, potassium chloride  40 mill equivalents, Ativan  1 mg p.o., and cephalexin .    Assessment and Plan: CVA Patient presents after having multiple falls.  Initial CT did not note any acute abnormality.  Patient was not a candidate for thrombolytics due to being outside the window.  MRI revealed numerous infarcts in the subcortical and periventricular white matter of the right frontal and parietal lobes consistent with acute infarcts. - Check CTA of the head and neck - 2d echo reviewed. Normal LVEF with no atrial level shunt - therapy recs for inpt rehab, pending dispo - Aspirin  and Plavix recommended by Neurology x 3 mos then plavix alone - Recs for 30 day monitor at discharge   Bladder prolapse Urinary tract infection Present on admission.  Patient reports history of bladder prolapse.  Urinalysis positive for large leukocytes, positive nitrites, many bacteria, 11-20 RBCs/hpf, 6-10 squamous epithelial cells, and greater than 50 WBCs.  Patient had initially been started on  cephalexin . - Will plan to complete empiric Rocephin IV   Hypokalemia -normalized   Hyperlipidemia Lipid panel noted total  cholesterol of 208 with LDL 136.  Patient unable to tolerate statins - Consider Leqvio or PCSK9 inhibitor when able   Hypothyroidism - Continue levothyroxine   HTN -on lasix  40mg  qday, metoprolol  tartrate 75mg  TID, nifedipine  60mg  every day PTA -Plan to gradually resume bp meds as tolerated and avoid  over-correction of HTN -On metoprolol  25mg  BID and nifedipine  30mg  -Will increase nifedipine  to 60mg   Sinus tach -reviewed and noted on EKG -on metoprolol  at lower dose and restarted nifedipine  at lower dose to avoid overcorrection of bp      Subjective: Without complaints. Feeling better overall this AM. Pt re-affirms her wishes NOT to have family members updated as having family involved would only aggravate her anxieties. Pt is alert and oriented x 3 and demonstrated insight to her current care  Physical Exam: Vitals:   04/05/24 0118 04/05/24 0600 04/05/24 0824 04/05/24 1229  BP: (!) 159/70 (!) 173/70 (!) 167/73 (!) 176/80  Pulse: (!) 54 (!) 57 64 78  Resp: 18 18 18 14   Temp: 98.1 F (36.7 C) 98.2 F (36.8 C) 98.2 F (36.8 C) 98.7 F (37.1 C)  TempSrc: Oral Oral Oral Oral  SpO2: 97% 97% 97% 97%  Weight:      Height:       General exam: Conversant, in no acute distress Respiratory system: normal chest rise, clear, no audible wheezing Cardiovascular system: regular rhythm, s1-s2 Gastrointestinal system: Nondistended, nontender, pos BS Central nervous system: No seizures, no tremors Extremities: No cyanosis, no joint deformities Skin: No rashes, no pallor Psychiatry: Affect normal // no auditory hallucinations   Data Reviewed:  There are no new results to review at this time.  Family Communication: Pt in room, family not at bedside  Disposition: Status is: Inpatient Remains inpatient appropriate because: Severity of illness  Planned Discharge Destination: Rehab    Author: Garnette Pelt, MD 04/05/2024 3:04 PM  For on call review [email or web address].

## 2024-04-05 NOTE — Plan of Care (Signed)
  Problem: Education: Goal: Knowledge of disease or condition will improve Outcome: Progressing   Problem: Ischemic Stroke/TIA Tissue Perfusion: Goal: Complications of ischemic stroke/TIA will be minimized Outcome: Progressing   Problem: Coping: Goal: Will verbalize positive feelings about self Outcome: Progressing Goal: Will identify appropriate support needs Outcome: Progressing   Problem: Nutrition: Goal: Risk of aspiration will decrease Outcome: Progressing Goal: Dietary intake will improve Outcome: Progressing   Problem: Education: Goal: Knowledge of disease or condition will improve Outcome: Progressing   Problem: Ischemic Stroke/TIA Tissue Perfusion: Goal: Complications of ischemic stroke/TIA will be minimized Outcome: Progressing

## 2024-04-06 DIAGNOSIS — S7001XA Contusion of right hip, initial encounter: Secondary | ICD-10-CM | POA: Diagnosis not present

## 2024-04-06 DIAGNOSIS — I639 Cerebral infarction, unspecified: Secondary | ICD-10-CM | POA: Diagnosis not present

## 2024-04-06 DIAGNOSIS — I6340 Cerebral infarction due to embolism of unspecified cerebral artery: Secondary | ICD-10-CM | POA: Diagnosis not present

## 2024-04-06 NOTE — Plan of Care (Signed)
 Was extremely emotional about her condition, actively listening her feelings, education , emotional support was provided. Her close friend along with his wife came to visit her.   Problem: Education: Goal: Knowledge of disease or condition will improve Outcome: Progressing   Problem: Education: Goal: Knowledge of secondary prevention will improve (MUST DOCUMENT ALL) Outcome: Progressing   Problem: Education: Goal: Knowledge of patient specific risk factors will improve (DELETE if not current risk factor) Outcome: Progressing   Problem: Ischemic Stroke/TIA Tissue Perfusion: Goal: Complications of ischemic stroke/TIA will be minimized Outcome: Progressing   Problem: Health Behavior/Discharge Planning: Goal: Ability to manage health-related needs will improve Outcome: Progressing   Problem: Nutrition: Goal: Adequate nutrition will be maintained Outcome: Progressing   Problem: Activity: Goal: Risk for activity intolerance will decrease Outcome: Progressing   Problem: Elimination: Goal: Will not experience complications related to bowel motility Outcome: Progressing   Problem: Pain Managment: Goal: General experience of comfort will improve and/or be controlled Outcome: Progressing   Problem: Safety: Goal: Ability to remain free from injury will improve Outcome: Progressing   Problem: Ischemic Stroke/TIA Tissue Perfusion: Goal: Complications of ischemic stroke/TIA will be minimized Outcome: Progressing   Problem: Education: Goal: Knowledge of patient specific risk factors will improve (DELETE if not current risk factor) Outcome: Progressing   Problem: Health Behavior/Discharge Planning: Goal: Goals will be collaboratively established with patient/family Outcome: Progressing

## 2024-04-06 NOTE — Progress Notes (Signed)
 Progress Note   Patient: [name] FMW:[id number] DOB: [birth year] DOA: 03/31/2024     5 DOS: the patient was seen and examined on 04/06/2024   Brief hospital course: 78 y.o. female with medical history significant of hypertension, hypothyroidism, CAD, anxiety, depression presents with multiple falls and difficulty walking.   She has experienced multiple falls, approximately five or six times over the last couple days, due to her knees not supporting her. The right leg is weaker at baseline that she reports is due to a fall back in January of this year when she fell trying to get out of her car due to the ice.   Recently, she has experienced increased difficulty with mobility, particularly when trying to get out of a recliner. She describes her legs as 'not substantial enough to hold me up,' leading to crawling on the floor to prevent falls. On Tuesday, she fell while trying to let her dog out, describing a sensation of moving in a circle, akin to being on a merry-go-round, and possibly feeling dizzy.   She reports a prolapsed bladder, which has been present for a couple of years, requiring her to manually reposition it. She experiences frequent urination, especially after taking Lasix , which she takes in the morning along with other medications.   She has no family in the area, as she was transferred to [facility] for work and has since retired. 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 All of her family members are deceased. No chest pain and heart racing. She is unsure if she hit her head during falls and is uncertain about dizziness. No discomfort with urination but has a prolapsed bladder.   In the emergency department patient was noted to be afebrile with heart rates elevated up to 103, blood pressures 146/92 to 171/90, and all other vital signs maintained.  Labs from 11/11 significant for potassium 3.3, BUN 19,  creatinine 1.49, and calcium 8.7.  Urinalysis positive for large leukocytes, positive nitrites, many bacteria, 11-20 RBCs/hpf, 6-10 squamous epithelial cells, and greater than 50 WBCs.  UDS positive for benzos.  CT scan of the head did not reveal any acute abnormality.  Subsequently MRI of the brain revealed numerous foci of restricted diffusion in the subcortical and periventricular white matter of the right frontal and parietal lobes consistent with acute infarcts.  Patient had been given Zofran, potassium chloride  40 mill equivalents, Ativan  1 mg p.o., and cephalexin .    Assessment and Plan: CVA Patient presents after having multiple falls.  Initial CT did not note any acute abnormality.  Patient was not a candidate for thrombolytics due to being outside the window.  MRI revealed numerous infarcts in the subcortical and periventricular white matter of the right frontal and parietal lobes consistent with acute infarcts. - Check CTA of the head and neck - 2d echo reviewed. Normal LVEF with no atrial level shunt - therapy recs for inpt rehab, pending dispo - Aspirin  and Plavix recommended by Neurology x 3 mos then plavix alone - Recs for 30 day monitor at discharge -TOC following for SNF placement,    Bladder prolapse Urinary tract infection Present on admission.  Patient reports history of bladder prolapse.  Urinalysis positive for large leukocytes, positive nitrites, many bacteria, 11-20 RBCs/hpf, 6-10 squamous epithelial cells, and greater than 50 WBCs.  Patient had initially been started on  cephalexin . - Completed course of empiric Rocephin IV   Hypokalemia -normalized  Hyperlipidemia Lipid panel noted total cholesterol of 208 with LDL 136.  Patient unable to tolerate statins - Consider Leqvio or PCSK9 inhibitor when able   Hypothyroidism - Continue levothyroxine   HTN -on lasix  40mg  qday, metoprolol  tartrate 75mg  TID, nifedipine  60mg  every day PTA -Plan to gradually resume bp meds  as tolerated and avoid over-correction of HTN -On metoprolol  25mg  BID and nifedipine  60mg   Sinus tach -reviewed and noted on EKG -resolved      Subjective: No complaints this AM. Eager to go to rehab soon  Physical Exam: Vitals:   04/06/24 0405 04/06/24 0800 04/06/24 0912 04/06/24 1226  BP: (!) 173/73 (!) 147/96 (!) 147/96 (!) 155/84  Pulse: (!) 56 66 66   Resp: 16     Temp: 97.7 F (36.5 C) 98.6 F (37 C)  97.7 F (36.5 C)  TempSrc: Axillary Oral  Oral  SpO2: 98% 99%  100%  Weight:      Height:       General exam: Awake, laying in bed, in nad Respiratory system: Normal respiratory effort, no wheezing Cardiovascular system: regular rate, s1, s2 Gastrointestinal system: Soft, nondistended, positive BS Central nervous system: CN2-12 grossly intact, strength intact Extremities: Perfused, no clubbing Skin: Normal skin turgor, no notable skin lesions seen Psychiatry: Mood normal // no visual hallucinations   Data Reviewed:  There are no new results to review at this time.  Family Communication: Pt in room, family not at bedside  Disposition: Status is: Inpatient Remains inpatient appropriate because: Severity of illness  Planned Discharge Destination: Rehab    Author: Garnette Pelt, MD 04/06/2024 3:44 PM  For on call review [email or web address].

## 2024-04-06 NOTE — Progress Notes (Signed)
 PT Cancellation Note  Patient Details Name: [name] MRN: [id number] DOB: March 31, 1946   Cancelled Treatment:    Reason Eval/Treat Not Completed: (P) Patient declined, no reason specified (Pt reports fatigue from recently returning from the bathroom and increased anxiety. Will continue to follow.)   Darryle George 04/06/2024, 3:39 PM

## 2024-04-06 NOTE — TOC Progression Note (Signed)
 Transition of Care [facility]) - Progression Note    Patient Details  Name: [name] MRN: [id number] Date of Birth: Nov 12, 1945  Transition of Care [facility]) CM/SW Contact  Almarie CHRISTELLA Goodie, [place] Phone Number: 04/06/2024, 12:55 PM  Clinical Narrative:   CSW met with patient with friend, Alm, at bedside to provide bed offers for SNF and answer questions. Patient and friend in agreement with [facility]. CSW confirmed availability with [facility], and contacted CMA to request insurance authorization. CSW to follow.    Expected Discharge Plan: Skilled Nursing Facility Barriers to Discharge: Continued Medical Work up, English As A Second Language Teacher               Expected Discharge Plan and Services       Living arrangements for the past 2 months: Single Family Home                                       Social Drivers of Health (SDOH) Interventions SDOH Screenings   Food Insecurity: Food Insecurity Present (04/01/2024)  Housing: Low Risk  (04/01/2024)  Transportation Needs: No Transportation Needs (04/01/2024)  Utilities: Not At Risk (04/01/2024)  Depression (PHQ2-9): Medium Risk (12/10/2023)  Social Connections: Socially Isolated (04/01/2024)  Tobacco Use: Medium Risk (04/01/2024)    Readmission Risk Interventions     No data to display

## 2024-04-06 NOTE — Progress Notes (Signed)
 Occupational Therapy Treatment Patient Details Name: [name] MRN: [id number] DOB: [birth year] Today's Date: 04/06/2024   History of present illness Pt is 78 year old presented to [facility] on  03/31/24 for multiple falls. 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 PMH - htn, hypothyroidism, depression   OT comments  Patient received in supine and asking to go to bathroom. Patient demonstrating gains with bed mobility, tranfers, and self care standing at sink. Patient often requires cues for safety and sequencing due to very tangential. Patient will benefit from continued inpatient follow up therapy, <3 hours/day. Acute OT to continue to follow to address established goals to facilitate DC to next venue of care.        If plan is discharge home, recommend the following:  A little help with walking and/or transfers;A little help with bathing/dressing/bathroom;Assistance with cooking/housework;Direct supervision/assist for medications management;Direct supervision/assist for financial management;Assist for transportation;Help with stairs or ramp for entrance   Equipment Recommendations  Other (comment) (defer to next level of care)    Recommendations for Other Services      Precautions / Restrictions Precautions Precautions: Fall Recall of Precautions/Restrictions: Intact Restrictions Weight Bearing Restrictions Per Provider Order: No       Mobility Bed Mobility Overal bed mobility: Needs Assistance Bed Mobility: Supine to Sit     Supine to sit: Contact guard     General bed mobility comments: increased time due to tangential    Transfers Overall transfer level: Needs assistance Equipment used: Rolling walker (2 wheels) Transfers: Sit to/from Stand, Bed to chair/wheelchair/BSC Sit to Stand: Min assist, Contact guard assist           General transfer comment: min to CGA to stand and for stability, cues to stay close to  walker and for walker safety     Balance Overall balance assessment: Needs assistance Sitting-balance support: No upper extremity supported, Feet supported Sitting balance-Leahy Scale: Good Sitting balance - Comments: EOB   Standing balance support: Single extremity supported, Bilateral upper extremity supported, During functional activity Standing balance-Leahy Scale: Poor Standing balance comment: stood at sink and leaned on sink for balance, reliant on RW                           ADL either performed or assessed with clinical judgement   ADL Overall ADL's : Needs assistance/impaired Eating/Feeding: Set up;Sitting   Grooming: Wash/dry hands;Wash/dry face;Contact guard assist;Standing Grooming Details (indicate cue type and reason): at sink                 Toilet Transfer: Minimal assistance;Ambulation;Regular Toilet;Grab bars Toilet Transfer Details (indicate cue type and reason): CGA and cues for safety Toileting- Clothing Manipulation and Hygiene: Contact guard assist;Sitting/lateral lean;Sit to/from stand              Extremity/Trunk Assessment              Vision       Perception     Praxis     Communication Communication Communication: Impaired Factors Affecting Communication: Hearing impaired   Cognition Arousal: Alert Behavior During Therapy: Flat affect Cognition: Cognition impaired     Awareness: Intellectual awareness impaired Memory impairment (select all impairments): Short-term memory, Working memory Attention impairment (select first level of impairment): Sustained attention Executive functioning impairment (select all impairments): Organization, Problem solving, Reasoning OT - Cognition Comments: tangential and requires cues to stay on tasks and  for sequencing                 Following commands: Impaired Following commands impaired: Follows multi-step commands with increased time      Cueing   Cueing  Techniques: Verbal cues, Visual cues  Exercises      Shoulder Instructions       General Comments      Pertinent Vitals/ Pain       Pain Assessment Pain Assessment: Faces Faces Pain Scale: Hurts a little bit Pain Location: back Pain Descriptors / Indicators: Aching, Sore Pain Intervention(s): Monitored during session, Repositioned  Home Living                                          Prior Functioning/Environment              Frequency  Min 2X/week        Progress Toward Goals  OT Goals(current goals can now be found in the care plan section)  Progress towards OT goals: Progressing toward goals  Acute Rehab OT Goals Patient Stated Goal: to get back home when it's safe OT Goal Formulation: With patient Time For Goal Achievement: 04/16/24 Potential to Achieve Goals: Good ADL Goals Pt Will Perform Grooming: with modified independence;standing Pt Will Perform Upper Body Dressing: with modified independence;sitting Pt Will Perform Lower Body Dressing: with modified independence;sitting/lateral leans;sit to/from stand Pt Will Transfer to Toilet: with modified independence;ambulating;regular height toilet;grab bars Pt Will Perform Toileting - Clothing Manipulation and hygiene: with modified independence;sitting/lateral leans;sit to/from stand Additional ADL Goal #1: Pt will maintain attention for >/= 8 minutes during ADLs and functional mobility to improve overall attention and engagement during OT.  Plan      Co-evaluation                 AM-PAC OT 6 Clicks Daily Activity     Outcome Measure   Help from another person eating meals?: None Help from another person taking care of personal grooming?: A Little Help from another person toileting, which includes using toliet, bedpan, or urinal?: A Little Help from another person bathing (including washing, rinsing, drying)?: A Lot Help from another person to put on and taking off regular  upper body clothing?: A Little Help from another person to put on and taking off regular lower body clothing?: A Lot 6 Click Score: 17    End of Session Equipment Utilized During Treatment: Rolling walker (2 wheels);Gait belt  OT Visit Diagnosis: Unsteadiness on feet (R26.81);Muscle weakness (generalized) (M62.81);Other symptoms and signs involving cognitive function   Activity Tolerance Patient tolerated treatment well   Patient Left in chair;with call bell/phone within reach;with chair alarm set   Nurse Communication Mobility status        Time: 8675-8650 OT Time Calculation (min): 25 min  Charges: OT General Charges $OT Visit: 1 Visit OT Treatments $Self Care/Home Management : 23-37 mins  Dick [name], OTA Acute Rehabilitation Services  Office [phone]   [name] 04/06/2024, 2:10 PM

## 2024-04-06 NOTE — Plan of Care (Signed)
   Problem: Education: Goal: Knowledge of disease or condition will improve Outcome: Progressing Goal: Knowledge of secondary prevention will improve (MUST DOCUMENT ALL) Outcome: Progressing Goal: Knowledge of patient specific risk factors will improve (DELETE if not current risk factor) Outcome: Progressing   Problem: Ischemic Stroke/TIA Tissue Perfusion: Goal: Complications of ischemic stroke/TIA will be minimized Outcome: Progressing   Problem: Coping: Goal: Will verbalize positive feelings about self Outcome: Progressing Goal: Will identify appropriate support needs Outcome: Progressing   Problem: Health Behavior/Discharge Planning: Goal: Ability to manage health-related needs will improve Outcome: Progressing Goal: Goals will be collaboratively established with patient/family Outcome: Progressing   Problem: Self-Care: Goal: Ability to participate in self-care as condition permits will improve Outcome: Progressing Goal: Verbalization of feelings and concerns over difficulty with self-care will improve Outcome: Progressing Goal: Ability to communicate needs accurately will improve Outcome: Progressing   Problem: Nutrition: Goal: Risk of aspiration will decrease Outcome: Progressing Goal: Dietary intake will improve Outcome: Progressing   Problem: Education: Goal: Knowledge of General Education information will improve Description: Including pain rating scale, medication(s)/side effects and non-pharmacologic comfort measures Outcome: Progressing   Problem: Health Behavior/Discharge Planning: Goal: Ability to manage health-related needs will improve Outcome: Progressing   Problem: Clinical Measurements: Goal: Ability to maintain clinical measurements within normal limits will improve Outcome: Progressing Goal: Will remain free from infection Outcome: Progressing Goal: Diagnostic test results will improve Outcome: Progressing Goal: Respiratory complications will  improve Outcome: Progressing Goal: Cardiovascular complication will be avoided Outcome: Progressing   Problem: Activity: Goal: Risk for activity intolerance will decrease Outcome: Progressing   Problem: Nutrition: Goal: Adequate nutrition will be maintained Outcome: Progressing   Problem: Coping: Goal: Level of anxiety will decrease Outcome: Progressing   Problem: Elimination: Goal: Will not experience complications related to bowel motility Outcome: Progressing Goal: Will not experience complications related to urinary retention Outcome: Progressing   Problem: Pain Managment: Goal: General experience of comfort will improve and/or be controlled Outcome: Progressing   Problem: Safety: Goal: Ability to remain free from injury will improve Outcome: Progressing   Problem: Skin Integrity: Goal: Risk for impaired skin integrity will decrease Outcome: Progressing   Problem: Education: Goal: Knowledge of disease or condition will improve Outcome: Progressing Goal: Knowledge of secondary prevention will improve (MUST DOCUMENT ALL) Outcome: Progressing Goal: Knowledge of patient specific risk factors will improve (DELETE if not current risk factor) Outcome: Progressing   Problem: Ischemic Stroke/TIA Tissue Perfusion: Goal: Complications of ischemic stroke/TIA will be minimized Outcome: Progressing   Problem: Coping: Goal: Will verbalize positive feelings about self Outcome: Progressing Goal: Will identify appropriate support needs Outcome: Progressing   Problem: Health Behavior/Discharge Planning: Goal: Ability to manage health-related needs will improve Outcome: Progressing Goal: Goals will be collaboratively established with patient/family Outcome: Progressing   Problem: Self-Care: Goal: Ability to participate in self-care as condition permits will improve Outcome: Progressing Goal: Verbalization of feelings and concerns over difficulty with self-care will  improve Outcome: Progressing Goal: Ability to communicate needs accurately will improve Outcome: Progressing   Problem: Nutrition: Goal: Risk of aspiration will decrease Outcome: Progressing Goal: Dietary intake will improve Outcome: Progressing

## 2024-04-07 DIAGNOSIS — I6340 Cerebral infarction due to embolism of unspecified cerebral artery: Secondary | ICD-10-CM | POA: Diagnosis not present

## 2024-04-07 DIAGNOSIS — S7001XA Contusion of right hip, initial encounter: Secondary | ICD-10-CM | POA: Diagnosis not present

## 2024-04-07 DIAGNOSIS — I639 Cerebral infarction, unspecified: Secondary | ICD-10-CM | POA: Diagnosis not present

## 2024-04-07 LAB — COMPREHENSIVE METABOLIC PANEL WITH GFR
ALT: 9 U/L (ref 0–44)
AST: 18 U/L (ref 15–41)
Albumin: 3 g/dL — ABNORMAL LOW (ref 3.5–5.0)
Alkaline Phosphatase: 69 U/L (ref 38–126)
Anion gap: 9 (ref 5–15)
BUN: 15 mg/dL (ref 8–23)
CO2: 23 mmol/L (ref 22–32)
Calcium: 8.8 mg/dL — ABNORMAL LOW (ref 8.9–10.3)
Chloride: 109 mmol/L (ref 98–111)
Creatinine, Ser: 1.29 mg/dL — ABNORMAL HIGH (ref 0.44–1.00)
GFR, Estimated: 42 mL/min — ABNORMAL LOW (ref >60)
Glucose, Bld: 88 mg/dL (ref 70–99)
Potassium: 3.9 mmol/L (ref 3.5–5.1)
Sodium: 141 mmol/L (ref 135–145)
Total Bilirubin: 0.6 mg/dL (ref 0.0–1.2)
Total Protein: 6.3 g/dL — ABNORMAL LOW (ref 6.5–8.1)

## 2024-04-07 LAB — CBC
HCT: 35.8 % — ABNORMAL LOW (ref 36.0–46.0)
Hemoglobin: 11.7 g/dL — ABNORMAL LOW (ref 12.0–15.0)
MCH: 31.8 pg (ref 26.0–34.0)
MCHC: 32.7 g/dL (ref 30.0–36.0)
MCV: 97.3 fL (ref 80.0–100.0)
Platelets: 359 K/uL (ref 150–400)
RBC: 3.68 MIL/uL — ABNORMAL LOW (ref 3.87–5.11)
RDW: 12.8 % (ref 11.5–15.5)
WBC: 9.7 K/uL (ref 4.0–10.5)
nRBC: 0 % (ref 0.0–0.2)

## 2024-04-07 MED ORDER — CLOPIDOGREL BISULFATE 75 MG PO TABS: 75.0000 mg | ORAL_TABLET | Freq: Every day | ORAL | Status: AC

## 2024-04-07 MED ORDER — METOPROLOL TARTRATE 50 MG PO TABS: 50.0000 mg | ORAL_TABLET | Freq: Two times a day (BID) | ORAL | Status: DC

## 2024-04-07 MED ORDER — ASPIRIN 81 MG PO TBEC: 81.0000 mg | DELAYED_RELEASE_TABLET | Freq: Two times a day (BID) | ORAL | Status: AC

## 2024-04-07 MED ORDER — CLORAZEPATE DIPOTASSIUM 15 MG PO TABS: 15.0000 mg | ORAL_TABLET | Freq: Three times a day (TID) | ORAL | 0 refills | Status: DC | PRN

## 2024-04-07 NOTE — Discharge Summary (Addendum)
 Physician Discharge Summary   Patient: [name] MRN: [id number] DOB: [birth year]  Admit date:     03/31/2024  Discharge date: 04/07/24  Discharge Physician: Garnette Pelt   PCP: Garald Karlynn GAILS, MD   Recommendations at discharge:    Follow up with PCP in 1-2 weeks Follow up with Neurology as scheduled Gradually titrate BP meds as tolerated with eventual goal of normotension   Discharge Diagnoses: Principal Problem:   CVA (cerebral vascular accident) (HCC) Active Problems:   UTI (urinary tract infection)   History of prolapse of bladder   Hypokalemia   Dyslipidemia   Hypothyroidism  Resolved Problems:   * No resolved hospital problems. *  Hospital Course: 78 y.o. female with medical history significant of hypertension, hypothyroidism, CAD, anxiety, depression presents with multiple falls and difficulty walking.   She has experienced multiple falls, approximately five or six times over the last couple days, due to her knees not supporting her. The right leg is weaker at baseline that she reports is due to a fall back in January of this year when she fell trying to get out of her car due to the ice.   Recently, she has experienced increased difficulty with mobility, particularly when trying to get out of a recliner. She describes her legs as 'not substantial enough to hold me up,' leading to crawling on the floor to prevent falls. On Tuesday, she fell while trying to let her dog out, describing a sensation of moving in a circle, akin to being on a merry-go-round, and possibly feeling dizzy.   She reports a prolapsed bladder, which has been present for a couple of years, requiring her to manually reposition it. She experiences frequent urination, especially after taking Lasix , which she takes in the morning along with other medications.   She has no family in the area, as she was transferred to [facility] for work and has since retired. 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 All of her family members are deceased. No chest pain and heart racing. She is unsure if she hit her head during falls and is uncertain about dizziness. No discomfort with urination but has a prolapsed bladder.   In the emergency department patient was noted to be afebrile with heart rates elevated up to 103, blood pressures 146/92 to 171/90, and all other vital signs maintained.  Labs from 11/11 significant for potassium 3.3, BUN 19, creatinine 1.49, and calcium 8.7.  Urinalysis positive for large leukocytes, positive nitrites, many bacteria, 11-20 RBCs/hpf, 6-10 squamous epithelial cells, and greater than 50 WBCs.  UDS positive for benzos.  CT scan of the head did not reveal any acute abnormality.  Subsequently MRI of the brain revealed numerous foci of restricted diffusion in the subcortical and periventricular white matter of the right frontal and parietal lobes consistent with acute infarcts.  Patient had been given Zofran, potassium chloride  40 mill equivalents, Ativan  1 mg p.o., and cephalexin .    Assessment and Plan: CVA Patient presents after having multiple falls.  Initial CT did not note any acute abnormality.  Patient was not a candidate for thrombolytics due to being outside the window.  MRI revealed numerous infarcts in the subcortical and periventricular white matter of the right frontal and parietal lobes consistent with acute infarcts. - 2d echo reviewed. Normal LVEF with no atrial level shunt - therapy recs for inpt rehab, pending dispo - Aspirin  and Plavix recommended by Neurology  x 3 mos then plavix alone - Recs for 30 day monitor at discharge, however per Neurology, pt has concerns for out of pocket cost and wishes to focus only on interventions that increase quality of life at this time    Bladder prolapse Urinary tract infection Present on admission.  Patient reports history of bladder  prolapse.  Urinalysis positive for large leukocytes, positive nitrites, many bacteria, 11-20 RBCs/hpf, 6-10 squamous epithelial cells, and greater than 50 WBCs.  Patient had initially been started on  cephalexin . - Completed course of empiric Rocephin IV   Hypokalemia -normalized   Hyperlipidemia Lipid panel noted total cholesterol of 208 with LDL 136.  Patient unable to tolerate statins - Consider Leqvio or PCSK9 inhibitor when able   Hypothyroidism - Continue levothyroxine    HTN -on lasix  40mg  qday, metoprolol  tartrate 75mg  TID, nifedipine  60mg  every day PTA -Plan to gradually resume bp meds as tolerated and avoid over-correction of HTN -On metoprolol  25mg  BID and nifedipine  60mg . BP stable   Sinus tach -reviewed and noted on EKG -resolved       Consultants: Neurology Procedures performed:   Disposition: Skilled nursing facility Diet recommendation:  Cardiac diet DISCHARGE MEDICATION: Allergies as of 04/07/2024       Reactions   Epinephrine  Other (See Comments)   unknown   Isosorbide Mononitrate Other (See Comments)   Severe headache   Lidocaine  Nausea And Vomiting   Morphine Other (See Comments)   unknown   Propoxyphene N-acetaminophen Other (See Comments)   unknown   Alprazolam Other (See Comments)   REACTION: very sedating   Atorvastatin Other (See Comments)   REACTION: weakness   Codeine Nausea And Vomiting   Ezetimibe Other (See Comments)   REACTION: myalgia   Prilosec [omeprazole ] Diarrhea, Other (See Comments)   Chest discomfort   Rosuvastatin Other (See Comments)   REACTION: leg weakness   Simvastatin Other (See Comments)   REACTION: leg  weakness   Allopurinol     ?bruising per pt   Clonidine  Derivatives    whoozy   Meloxicam     diarrhea   Metronidazole     Abd cramps   Tetracycline Nausea And Vomiting   Triple Antibiotic [bacitracin-neomycin-polymyxin]    rash   Amoxicillin  Nausea And Vomiting   Can take Ampicillin    Ciprofloxacin Nausea  And Vomiting   Diphenhydramine Hcl Anxiety        Medication List     STOP taking these medications    clobetasol  ointment 0.05 % Commonly known as: TEMOVATE    furosemide  40 MG tablet Commonly known as: LASIX    LORazepam  0.5 MG tablet Commonly known as: ATIVAN    nitroGLYCERIN  0.4 MG/SPRAY spray Commonly known as: NITROLINGUAL    potassium chloride  SA 20 MEQ tablet Commonly known as: KLOR-CON  M       TAKE these medications    acetaminophen 500 MG tablet Commonly known as: TYLENOL Take 500-1,000 mg by mouth every 6 (six) hours as needed for moderate pain (pain score 4-6).   aspirin  EC 81 MG tablet Take 1 tablet (81 mg total) by mouth 2 (two) times daily.   Cholecalciferol 25 MCG (1000 UT) tablet Take 1,000 Units by mouth daily.   clopidogrel 75 MG tablet Commonly known as: PLAVIX Take 1 tablet (75 mg total) by mouth daily. Start taking on: April 08, 2024   clorazepate  15 MG tablet Commonly known as: TRANXENE  Take 1 tablet (15 mg total) by mouth 3 (three) times daily as needed (anxiety). What changed:  how much to take  how to take this when to take this reasons to take this additional instructions   diphenoxylate -atropine  2.5-0.025 MG tablet Commonly known as: Lomotil  Take 1 tablet by mouth 4 (four) times daily as needed for diarrhea or loose stools.   doxepin  50 MG capsule Commonly known as: SINEQUAN  Take 3 capsules (150 mg total) by mouth at bedtime.   levothyroxine  100 MCG tablet Commonly known as: SYNTHROID  Start Levothyroxine  100 mcg every other day and 50 mcg every other day.   metoprolol  tartrate 50 MG tablet Commonly known as: LOPRESSOR  Take 1 tablet (50 mg total) by mouth 2 (two) times daily. What changed:  how much to take when to take this   NIFEdipine  60 MG 24 hr tablet Commonly known as: Procardia  XL Take 1 tablet (60 mg total) by mouth daily.        Contact information for follow-up providers     [facility]. Schedule an appointment as soon as possible for a visit in 1 month(s).   Specialty: Neurology Why: stroke clinic Contact information: [address] [place]  [address] [phone]             Contact information for after-discharge care     Destination     [facility], [place] .   Service: Skilled Nursing Contact information: [address] [place]  [address] [phone]                    Discharge Exam: [name]   03/31/24 1633  Weight: 68 kg   General exam: Awake, laying in bed, in nad Respiratory system: Normal respiratory effort, no wheezing Cardiovascular system: regular rate, s1, s2 Gastrointestinal system: Soft, nondistended, positive BS Central nervous system: CN2-12 grossly intact, strength intact Extremities: Perfused, no clubbing Skin: Normal skin turgor, no notable skin lesions seen Psychiatry: Mood normal // no visual hallucinations   Condition at discharge: fair  The results of significant diagnostics from this hospitalization (including imaging, microbiology, ancillary and laboratory) are listed below for reference.   Imaging Studies: CT ANGIO HEAD NECK W WO CM Result Date: 04/02/2024 EXAM: CTA HEAD AND NECK WITH AND WITHOUT 04/02/2024 10:03:44 AM TECHNIQUE: CTA of the head and neck was performed with and without the administration of intravenous contrast. 75 mL of iohexol  (OMNIPAQUE ) 350 MG/ML injection was administered. Multiplanar 2D and/or 3D reformatted images are provided for review. Automated exposure control, iterative reconstruction, and/or weight based adjustment of the mA/kV was utilized to reduce the radiation dose to as low as reasonably achievable. Stenosis of the internal carotid arteries measured using NASCET criteria. COMPARISON: None available CLINICAL HISTORY: Neuro deficit, acute, stroke suspected. FINDINGS: CTA NECK: AORTIC ARCH AND ARCH  VESSELS: The aortic arch demonstrates mild-to-moderate calcific atheromatous disease. There is calcific plaque present within the origins of the brachiocephalic and left common carotid arteries. There is no flow-limiting stenosis. No significant stenosis of the subclavian arteries. No dissection or arterial injury. CERVICAL CAROTID ARTERIES: There is mild calcific plaque within the origins of the internal carotid arteries, with no significant stenosis. The cervical segments of the internal carotid arteries are mildly tortuous, but normal in caliber. No dissection or arterial injury. CERVICAL VERTEBRAL ARTERIES: There is occlusive or near occlusive stenosis of the distal V4 segments of both vertebral arteries. LUNGS AND MEDIASTINUM: Moderate central lobular emphysema noted in the lung apices. The mediastinum is unremarkable. SOFT TISSUES: No acute abnormality. BONES: No acute abnormality. CTA HEAD: ANTERIOR CIRCULATION: No  significant stenosis of the internal carotid arteries. No significant stenosis of the anterior cerebral arteries. There is mild atheromatous irregularity of the distal M1 segments of the middle cerebral arteries bilaterally. There is also mild-to-moderate stenosis present within proximal M2 branches bilaterally. No aneurysm. POSTERIOR CIRCULATION: There is fetal origin of the right posterior cerebral artery. No significant stenosis of the posterior cerebral arteries. There is occlusive or near occlusive stenosis of the basilar artery. 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 There is occlusive or near occlusive stenosis of the distal V4 segments of both vertebral arteries. No aneurysm. OTHER: There is mild-to-moderate periventricular white matter disease. There is moderate opacification of the sphenoid sinuses, with dense debris on the left, suggesting chronic sinusitis. There is also moderate opacification of the ethmoid air cells,  more pronounced on the right. There is focal occlusion of the right pericholecystal artery seen on images 70 and 71 of series 16. There is also mild-to-moderate stenosis of the left pericholecystal artery. No dural venous sinus thrombosis on this non-dedicated study. Findings were communicated to Dr. Vanessa at 10:33 AM 04/02/2024. IMPRESSION: 1. 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 Fetal origin of the right posterior cerebral artery 2. Focal occlusion of the right pericallosal artery and mild-to-moderate stenosis of the left pericallosal artery 3. Mild atheromatous irregularity of the distal M1 segments of the middle cerebral arteries bilaterally and mild-to-moderate stenosis within proximal M2 branches bilaterally 4. Moderate centrilobular emphysema in the lung apices. Given patient age 55 assumed if not provided, consider evaluation for low-dose CT lung cancer screening program due to emphysema as an independent risk factor 5. Critical results communicated to Dr. Vanessa at 10:33 AM on 04/02/2024 Electronically signed by: Evalene Coho MD 04/02/2024 10:36 AM EST RP Workstation: [id number]   VAS US  LOWER EXTREMITY VENOUS (DVT) Result Date: 04/01/2024  Lower Venous DVT Study Patient Name:  [name] [facility]  Date of Exam:   04/01/2024 Medical Rec #: [id number]     Accession #:    [id number] Date of Birth: May 17, 1946      Patient Gender: F Patient Age:   81 years Exam Location:  [facility] Procedure:      VAS US  LOWER EXTREMITY VENOUS (DVT) Referring Phys: ARY CUMMINS --------------------------------------------------------------------------------  Indications: Stroke.  Comparison Study: Previous exam on 08/30/2023 ([facility]) was negative for DVT Performing Technologist: Ezzie Potters RVT, RDMS  Examination Guidelines: A complete evaluation includes B-mode  imaging, spectral Doppler, color Doppler, and power Doppler as needed of all accessible portions of each vessel. Bilateral testing is considered an integral part of a complete examination. Limited examinations for reoccurring indications may be performed as noted. The reflux portion of the exam is performed with the patient in reverse Trendelenburg.  +---------+---------------+---------+-----------+----------+--------------+ RIGHT    CompressibilityPhasicitySpontaneityPropertiesThrombus Aging +---------+---------------+---------+-----------+----------+--------------+ CFV      Full           Yes      Yes                                 +---------+---------------+---------+-----------+----------+--------------+ SFJ      Full                                                        +---------+---------------+---------+-----------+----------+--------------+  FV Prox  Full           Yes      Yes                                 +---------+---------------+---------+-----------+----------+--------------+ FV Mid   Full           Yes      Yes                                 +---------+---------------+---------+-----------+----------+--------------+ FV DistalFull           Yes      Yes                                 +---------+---------------+---------+-----------+----------+--------------+ PFV      Full                                                        +---------+---------------+---------+-----------+----------+--------------+ POP      Full           Yes      Yes                                 +---------+---------------+---------+-----------+----------+--------------+ PTV      Full                                                        +---------+---------------+---------+-----------+----------+--------------+ PERO     Full                                                        +---------+---------------+---------+-----------+----------+--------------+    +---------+---------------+---------+-----------+----------+--------------+ LEFT     CompressibilityPhasicitySpontaneityPropertiesThrombus Aging +---------+---------------+---------+-----------+----------+--------------+ CFV      Full           Yes      Yes                                 +---------+---------------+---------+-----------+----------+--------------+ SFJ      Full                                                        +---------+---------------+---------+-----------+----------+--------------+ FV Prox  Full           Yes      Yes                                 +---------+---------------+---------+-----------+----------+--------------+ FV Mid   Full  Yes      Yes                                 +---------+---------------+---------+-----------+----------+--------------+ FV DistalFull           Yes      Yes                                 +---------+---------------+---------+-----------+----------+--------------+ PFV      Full                                                        +---------+---------------+---------+-----------+----------+--------------+ POP      Full           Yes      Yes                                 +---------+---------------+---------+-----------+----------+--------------+ PTV      Full                                                        +---------+---------------+---------+-----------+----------+--------------+ PERO     Full                                                        +---------+---------------+---------+-----------+----------+--------------+     Summary: BILATERAL: - No evidence of deep vein thrombosis seen in the lower extremities, bilaterally. -No evidence of popliteal cyst, bilaterally.   *See table(s) above for measurements and observations. Electronically signed by Debby Robertson on 04/01/2024 at 4:50:42 PM.    Final    ECHOCARDIOGRAM COMPLETE Result Date: 04/01/2024     ECHOCARDIOGRAM REPORT   Patient Name:   [name] [facility] Date of Exam: 04/01/2024 Medical Rec #:  [id number]    Height:       66.0 in Accession #:    [id number]   Weight:       150.0 lb Date of Birth:  1946-03-29     BSA:          1.770 m Patient Age:    78 years     BP:           170/90 mmHg Patient Gender: F            HR:           90 bpm. Exam Location:  Inpatient Procedure: 2D Echo, Cardiac Doppler and Color Doppler (Both Spectral and Color            Flow Doppler were utilized during procedure). Indications:    Sroke 163.9  History:        Patient has prior history of Echocardiogram examinations, most                 recent 11/16/2016. CAD; Risk Factors:Dyslipidemia.  Sonographer:  Merlynn Argyle Referring Phys: [id number] [facility] IMPRESSIONS  1. Left ventricular ejection fraction, by estimation, is 60 to 65%. The left ventricle has normal function. The left ventricle has no regional wall motion abnormalities. Left ventricular diastolic parameters are consistent with Grade I diastolic dysfunction (impaired relaxation).  2. Right ventricular systolic function is normal. The right ventricular size is normal. There is normal pulmonary artery systolic pressure.  3. The mitral valve is normal in structure. No evidence of mitral valve regurgitation. No evidence of mitral stenosis.  4. The aortic valve is normal in structure. Aortic valve regurgitation is not visualized. No aortic stenosis is present.  5. The inferior vena cava is normal in size with greater than 50% respiratory variability, suggesting right atrial pressure of 3 mmHg. FINDINGS  Left Ventricle: Left ventricular ejection fraction, by estimation, is 60 to 65%. The left ventricle has normal function. The left ventricle has no regional wall motion abnormalities. The left ventricular internal cavity size was normal in size. There is  no left ventricular hypertrophy. Left ventricular diastolic parameters are consistent with Grade I diastolic dysfunction  (impaired relaxation). Right Ventricle: The right ventricular size is normal. No increase in right ventricular wall thickness. Right ventricular systolic function is normal. There is normal pulmonary artery systolic pressure. The tricuspid regurgitant velocity is 2.70 m/s, and  with an assumed right atrial pressure of 3 mmHg, the estimated right ventricular systolic pressure is 32.2 mmHg. Left Atrium: Left atrial size was normal in size. Right Atrium: Right atrial size was normal in size. Pericardium: There is no evidence of pericardial effusion. Mitral Valve: The mitral valve is normal in structure. No evidence of mitral valve regurgitation. No evidence of mitral valve stenosis. Tricuspid Valve: The tricuspid valve is normal in structure. Tricuspid valve regurgitation is mild . No evidence of tricuspid stenosis. Aortic Valve: The aortic valve is normal in structure. Aortic valve regurgitation is not visualized. No aortic stenosis is present. Pulmonic Valve: The pulmonic valve was normal in structure. Pulmonic valve regurgitation is not visualized. No evidence of pulmonic stenosis. Aorta: The aortic root is normal in size and structure. Venous: The inferior vena cava is normal in size with greater than 50% respiratory variability, suggesting right atrial pressure of 3 mmHg. IAS/Shunts: No atrial level shunt detected by color flow Doppler.  LEFT VENTRICLE PLAX 2D LVIDd:         3.60 cm   Diastology LVIDs:         2.40 cm   LV e' medial:    6.42 cm/s LV PW:         1.00 cm   LV E/e' medial:  13.8 LV IVS:        1.00 cm   LV e' lateral:   7.51 cm/s LVOT diam:     1.70 cm   LV E/e' lateral: 11.8 LV SV:         37 LV SV Index:   21 LVOT Area:     2.27 cm LV IVRT:       116 msec  RIGHT VENTRICLE             IVC RV Basal diam:  3.40 cm     IVC diam: 1.70 cm RV S prime:     13.20 cm/s TAPSE (M-mode): 1.7 cm      PULMONARY VEINS  Diastolic Velocity: 33.80 cm/s                             S/D  Velocity:       1.50                             Systolic Velocity:  51.80 cm/s LEFT ATRIUM             Index        RIGHT ATRIUM           Index LA diam:        2.80 cm 1.58 cm/m   RA Area:     16.90 cm LA Vol (A2C):   58.4 ml 33.00 ml/m  RA Volume:   43.40 ml  24.52 ml/m LA Vol (A4C):   47.1 ml 26.62 ml/m LA Biplane Vol: 53.3 ml 30.12 ml/m  AORTIC VALVE LVOT Vmax:   81.10 cm/s LVOT Vmean:  60.900 cm/s LVOT VTI:    0.163 m  AORTA Ao Root diam: 3.00 cm Ao Asc diam:  3.20 cm MITRAL VALVE                TRICUSPID VALVE MV Area (PHT): 2.08 cm     TR Peak grad:   29.2 mmHg MV Decel Time: 365 msec     TR Vmax:        270.00 cm/s MV E velocity: 88.70 cm/s MV A velocity: 109.00 cm/s  SHUNTS MV E/A ratio:  0.81         Systemic VTI:  0.16 m                             Systemic Diam: 1.70 cm Morene Brownie Electronically signed by Morene Brownie Signature Date/Time: 04/01/2024/4:26:10 PM    Final    MR BRAIN WO CONTRAST Result Date: 04/01/2024 EXAM: MRI BRAIN WITHOUT CONTRAST 04/01/2024 05:19:14 AM TECHNIQUE: Multiplanar multisequence MRI of the head/brain was performed without the administration of intravenous contrast. COMPARISON: CT of the head dated 03/31/2024. CLINICAL HISTORY: Neuro deficit, acute, stroke suspected. FINDINGS: BRAIN AND VENTRICLES: There are numerous foci of restricted diffusion present within the subcortical and periventricular white matter of the right frontal and parietal lobes. There are also a few punctate foci of probable restricted diffusion in the left occipital lobe and posteromedially within the right cerebellar hemisphere. There is age-related cerebral volume loss. There are confluence zones of increased T2 signal in the periventricular and deep cerebral white matter. No intracranial hemorrhage. No mass. No midline shift. No hydrocephalus. The sella is unremarkable. Normal flow voids. ORBITS: The patient is status post bilateral lens replacement. SINUSES AND MASTOIDS: There is  moderate mucosal disease within the ethmoid and sphenoid sinuses. BONES AND SOFT TISSUES: Normal marrow signal. No acute soft tissue abnormality. IMPRESSION: 1. Numerous foci of restricted diffusion in the subcortical and periventricular white matter of the right frontal and parietal lobes, consistent with acute infarcts. 2. Additional punctate foci of probable restricted diffusion in the left occipital lobe and posteromedially within the right cerebellar hemisphere. 3. No evidence of hemorrhage. Electronically signed by: Evalene Coho MD 04/01/2024 05:42 AM EST RP Workstation: [id number]   DG Knee Complete 4 Views Right Result Date: 04/01/2024 EXAM: 4 VIEW(S) XRAY OF THE RIGHT KNEE 04/01/2024 02:34:46 AM COMPARISON: None available. CLINICAL HISTORY: fall FINDINGS: BONES AND JOINTS:  No acute fracture. No focal osseous lesion. No joint dislocation. Small suprapatellar knee joint effusion. Mild tricompartmental degenerative changes, most prominent in the lateral and patellofemoral compartments. SOFT TISSUES: The soft tissues are unremarkable. IMPRESSION: 1. No acute fracture or dislocation. 2. Mild degenerative changes. 3. Small suprapatellar knee joint effusion. Electronically signed by: Pinkie Pebbles MD 04/01/2024 02:38 AM EST RP Workstation: [id number]   DG Hip Unilat W or Wo Pelvis 2-3 Views Right Result Date: 04/01/2024 EXAM: 3 VIEW(S) XRAY OF THE RIGHT HIP 04/01/2024 02:34:46 AM COMPARISON: None available. CLINICAL HISTORY: fall FINDINGS: BONES AND JOINTS: No acute fracture or focal osseous lesion. The hip joint is maintained. Mild degenerative changes of the right hip. SOFT TISSUES: The soft tissues are unremarkable. IMPRESSION: 1. No acute fracture or dislocation. Electronically signed by: Pinkie Pebbles MD 04/01/2024 02:38 AM EST RP Workstation: [id number]   CT Head Wo Contrast Result Date: 03/31/2024 EXAM: CT HEAD WITHOUT CONTRAST 03/31/2024 08:31:01 PM TECHNIQUE: CT of the head was  performed without the administration of intravenous contrast. Automated exposure control, iterative reconstruction, and/or weight based adjustment of the mA/kV was utilized to reduce the radiation dose to as low as reasonably achievable. COMPARISON: 03/15/2023 CLINICAL HISTORY: fall, hit back of head FINDINGS: BRAIN AND VENTRICLES: No acute hemorrhage. No evidence of acute infarct. No extra-axial collection. No mass effect or midline shift. Moderate patchy supratentorial white matter hypodensities. Mild global atrophy. ORBITS: No acute abnormality. SINUSES: Chronic opacification of left sphenoid sinus. Mild paranasal sinus mucosal thickening. SOFT TISSUES AND SKULL: No acute soft tissue abnormality. No skull fracture. IMPRESSION: 1. No acute intracranial abnormality related to the fall. 2. Moderate supratentorial white matter hypodensities, likely chronic microvascular ischemic changes, and mild global atrophy. Electronically signed by: Oneil Devonshire MD 03/31/2024 08:33 PM EST RP Workstation: [id number]    Microbiology: Results for orders placed or performed in visit on 08/22/22  Clostridium difficile EIA     Status: None   Collection Time: 08/23/22 12:00 AM   Specimen: Stool   Stool  Result Value Ref Range Status   C difficile Toxins A+B, EIA CANCELED      Comment: Test not performed. Specimen received frozen.  Result canceled by the ancillary.     Labs: CBC: Recent Labs  Lab 04/01/24 0634 04/02/24 0435 04/03/24 0444 04/04/24 0453 04/07/24 0154  WBC 5.0 4.1 3.6* 5.0 9.7  NEUTROABS 3.1  --   --   --   --   HGB 11.7* 10.8* 11.0* 10.9* 11.7*  HCT 35.2* 32.0* 32.2* 32.4* 35.8*  MCV 96.4 95.2 93.9 95.3 97.3  PLT 191 185 207 236 359   Basic Metabolic Panel: Recent Labs  Lab 03/31/24 1743 04/02/24 0435 04/03/24 0444 04/04/24 0453 04/07/24 0154  NA 136 137 139 140 141  K 3.3* 3.9 3.3* 3.7 3.9  CL 102 105 107 108 109  CO2 22 22 19* 20* 23  GLUCOSE 134* 90 97 91 88  BUN 19 9 8  <5*  15  CREATININE 1.49* 1.31* 1.38* 0.99 1.29*  CALCIUM 8.7* 8.6* 8.5* 8.6* 8.8*   Liver Function Tests: Recent Labs  Lab 03/31/24 1743 04/03/24 0444 04/04/24 0453 04/07/24 0154  AST 26 21 19 18   ALT 14 14 13 9   ALKPHOS 60 58 62 69  BILITOT 0.7 0.5 0.4 0.6  PROT 6.6 5.7* 5.8* 6.3*  ALBUMIN 3.5 2.9* 2.9* 3.0*   CBG: Recent Labs  Lab 03/31/24 1749  GLUCAP 132*    Discharge time spent: less than 30 minutes.  Signed:  Garnette Pelt, MD [facility] 04/07/2024

## 2024-04-07 NOTE — TOC Transition Note (Signed)
 Transition of Care [facility]) - Discharge Note   Patient Details  Name: [name] MRN: [id number] Date of Birth: 1946/04/16  Transition of Care [facility] CM/SW Contact:  Almarie CHRISTELLA Goodie, LCSW Phone Number: 04/07/2024, 1:36 PM   Clinical Narrative:   Patient approved for SNF at [place], and [place] can accept today. CSW sent discharge information, confirmed bed was ready for patient. Patient in agreement. Transport arranged with [facility] for next available.  Nurse to call report to [phone], Room 1202P.    Final next level of care: Skilled Nursing Facility Barriers to Discharge: Barriers Resolved   Patient Goals and CMS Choice            Discharge Placement              Patient chooses bed at: [facility] Patient to be transferred to facility by: [facility] Name of family member notified: Self Patient and family notified of of transfer: 04/07/24  Discharge Plan and Services Additional resources added to the After Visit Summary for                                       Social Drivers of Health (SDOH) Interventions SDOH Screenings   Food Insecurity: Food Insecurity Present (04/01/2024)  Housing: Low Risk  (04/01/2024)  Transportation Needs: No Transportation Needs (04/01/2024)  Utilities: Not At Risk (04/01/2024)  Depression (PHQ2-9): Medium Risk (12/10/2023)  Social Connections: Socially Isolated (04/01/2024)  Tobacco Use: Medium Risk (04/01/2024)     Readmission Risk Interventions     No data to display

## 2024-04-14 ENCOUNTER — Telehealth: Payer: Self-pay

## 2024-04-14 ENCOUNTER — Ambulatory Visit: Admitting: Internal Medicine

## 2024-04-14 NOTE — Telephone Encounter (Signed)
 Copied from CRM #[id number]. Topic: General - Other >> Apr 14, 2024 11:28 AM Mesmerise C wrote: Reason for CRM: Patient had a stroke on 11/11 and was hospitalized at [facility] for 8-9 days and now she's at [facility] wanted to make Dr. Garald aware cancelled appt for today

## 2024-04-15 NOTE — Telephone Encounter (Signed)
 Noted! Thank you

## 2024-04-29 ENCOUNTER — Inpatient Hospital Stay: Admitting: Internal Medicine

## 2024-05-05 ENCOUNTER — Telehealth: Payer: Self-pay

## 2024-05-05 NOTE — Telephone Encounter (Signed)
 Called and left detailed message for Leita giving OK on physical therapy order request

## 2024-05-05 NOTE — Telephone Encounter (Signed)
 Copied from CRM #[id number]. Topic: Clinical - Home Health Verbal Orders >> May 04, 2024  4:59 PM Rea ORN wrote: Caller/Agency: Linell Kos Callback Number: [phone] (secure line) Service Requested: Physical Therapy Frequency: 2 week 3, 1 week 5 Any new concerns about the patient? No

## 2024-05-07 ENCOUNTER — Ambulatory Visit: Admitting: Internal Medicine

## 2024-05-07 ENCOUNTER — Encounter: Payer: Self-pay | Admitting: Internal Medicine

## 2024-05-07 VITALS — BP 162/84 | HR 72 | Ht 66.0 in

## 2024-05-07 DIAGNOSIS — G8929 Other chronic pain: Secondary | ICD-10-CM | POA: Diagnosis not present

## 2024-05-07 DIAGNOSIS — R682 Dry mouth, unspecified: Secondary | ICD-10-CM | POA: Diagnosis not present

## 2024-05-07 DIAGNOSIS — M10072 Idiopathic gout, left ankle and foot: Secondary | ICD-10-CM

## 2024-05-07 DIAGNOSIS — M25559 Pain in unspecified hip: Secondary | ICD-10-CM | POA: Diagnosis not present

## 2024-05-07 DIAGNOSIS — I10 Essential (primary) hypertension: Secondary | ICD-10-CM

## 2024-05-07 DIAGNOSIS — F419 Anxiety disorder, unspecified: Secondary | ICD-10-CM

## 2024-05-07 DIAGNOSIS — E034 Atrophy of thyroid (acquired): Secondary | ICD-10-CM | POA: Diagnosis not present

## 2024-05-07 DIAGNOSIS — R269 Unspecified abnormalities of gait and mobility: Secondary | ICD-10-CM

## 2024-05-07 DIAGNOSIS — E785 Hyperlipidemia, unspecified: Secondary | ICD-10-CM

## 2024-05-07 DIAGNOSIS — R609 Edema, unspecified: Secondary | ICD-10-CM | POA: Diagnosis not present

## 2024-05-07 DIAGNOSIS — Z8673 Personal history of transient ischemic attack (TIA), and cerebral infarction without residual deficits: Secondary | ICD-10-CM | POA: Diagnosis not present

## 2024-05-07 DIAGNOSIS — N1831 Chronic kidney disease, stage 3a: Secondary | ICD-10-CM | POA: Diagnosis not present

## 2024-05-07 MED ORDER — PREDNISONE 10 MG PO TABS: ORAL_TABLET | ORAL | 1 refills | Status: AC

## 2024-05-07 MED ORDER — COLCHICINE 0.6 MG PO TABS: ORAL_TABLET | ORAL | 2 refills | Status: AC

## 2024-05-07 MED ORDER — FUROSEMIDE 40 MG PO TABS: 80.0000 mg | ORAL_TABLET | Freq: Every day | ORAL | 11 refills | Status: AC | PRN

## 2024-05-07 NOTE — Assessment & Plan Note (Signed)
Hydrate well.  Continue to monitor GFR 

## 2024-05-07 NOTE — Patient Instructions (Signed)
 [email or web address]-dRFvJp8iT7tQw29JtIydrHkpEolV5oluNCSIJn_1F8eTsU8ccf2XZk_DzNOnyRQFhoLTuPUAZ3eoAfurC0T-_jNRphQT1uTN5PZVC3UU02iQrvi71f7nan-Vh9IPkTNzwDkNyv8FSfdt-WgDnGaZ_hx-yjC3U48C2iLK071Yk6Pq85mV4iCMU2bBh2HwlPdVoP6JZYmXYGzvRlN8wAVYee8okCCu0BtH97o9eJB_mEtSDbECf3RFk_f3PW5lMekbGbkbQWhTcls.sTkzpDUvhSC4bP-sL6boo2pHTXaoJUaZH3hs3rsxY6Y&dib_tag=se&hvadid=[id number]&hvdev=c&hvexpln=0&hvlocphy=[id number]&hvnetw=g&hvocijid=[id number]--&hvqmt=e&hvrand=[id number]&hvtargid=kwd-[id number]&hydadcr=[id number]&keywords=nitro%2Bwalker%2Bamazon&mcid=[id number]&qid=[phone]&sr=8-2&th=1     Medical-Foldable-Rollator-Lightweight -- [id number]

## 2024-05-07 NOTE — Progress Notes (Signed)
 Subjective:  Patient ID: [name], female    DOB: 10/03/1945  Age: 78 y.o. MRN: [id number]  CC: Hospitalization Follow-up [facility] follow up 03/31/24-04/08/24 for CVA. Seen by [facility] post hospitalization. New bout of gout in right foot/ankle)   HPI [name] presents for HTN, CVA, post-hosptal and post-rehab ([facility]) f/u. C/o dry mouth, gout, anxiety Per hx:  dmit date:     03/31/2024  Discharge date: 04/07/24  Discharge Physician: Garnette Pelt    PCP: Garald Karlynn GAILS, MD    Recommendations at discharge:     Follow up with PCP in 1-2 weeks Follow up with Neurology as scheduled Gradually titrate BP meds as tolerated with eventual goal of normotension    Discharge Diagnoses: Principal Problem:   CVA (cerebral vascular accident) (HCC) Active Problems:   UTI (urinary tract infection)   History of prolapse of bladder   Hypokalemia   Dyslipidemia   Hypothyroidism   Resolved Problems:   * No resolved hospital problems. *   Hospital Course: 78 y.o. female with medical history significant of hypertension, hypothyroidism, CAD, anxiety, depression presents with multiple falls and difficulty walking.   She has experienced multiple falls, approximately five or six times over the last couple days, due to her knees not supporting her. The right leg is weaker at baseline that she reports is due to a fall back in January of this year when she fell trying to get out of her car due to the ice.   Recently, she has experienced increased difficulty with mobility, particularly when trying to get out of a recliner. She describes her legs as 'not substantial enough to hold me up,' leading to crawling on the floor to prevent falls. On Tuesday, she fell while trying to let her dog out, describing a sensation of moving in a circle, akin to being on a merry-go-round, and possibly feeling dizzy.   She reports a prolapsed bladder, which has been present for a couple of years,  requiring her to manually reposition it. She experiences frequent urination, especially after taking Lasix , which she takes in the morning along with other medications.   She has no family in the area, as she was transferred to [facility] for work and has since retired. 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 All of her family members are deceased. No chest pain and heart racing. She is unsure if she hit her head during falls and is uncertain about dizziness. No discomfort with urination but has a prolapsed bladder.   In the emergency department patient was noted to be afebrile with heart rates elevated up to 103, blood pressures 146/92 to 171/90, and all other vital signs maintained.  Labs from 11/11 significant for potassium 3.3, BUN 19, creatinine 1.49, and calcium 8.7.  Urinalysis positive for large leukocytes, positive nitrites, many bacteria, 11-20 RBCs/hpf, 6-10 squamous epithelial cells, and greater than 50 WBCs.  UDS positive for benzos.  CT scan of the head did not reveal any acute abnormality.  Subsequently MRI of the brain revealed numerous foci of restricted diffusion in the subcortical and periventricular white matter of the right frontal and parietal lobes consistent with acute infarcts.  Patient had been given Zofran , potassium chloride  40 mill equivalents, Ativan  1 mg p.o., and cephalexin .     Assessment and Plan: CVA Patient presents after having multiple falls.  Initial CT did not note any acute abnormality.  Patient was not  a candidate for thrombolytics due to being outside the window.  MRI revealed numerous infarcts in the subcortical and periventricular white matter of the right frontal and parietal lobes consistent with acute infarcts. - 2d echo reviewed. Normal LVEF with no atrial level shunt - therapy recs for inpt rehab, pending dispo - Aspirin  and Plavix  recommended by Neurology x 3 mos then  plavix  alone - Recs for 30 day monitor at discharge, however per Neurology, pt has concerns for out of pocket cost and wishes to focus only on interventions that increase quality of life at this time    Bladder prolapse Urinary tract infection Present on admission.  Patient reports history of bladder prolapse.  Urinalysis positive for large leukocytes, positive nitrites, many bacteria, 11-20 RBCs/hpf, 6-10 squamous epithelial cells, and greater than 50 WBCs.  Patient had initially been started on  cephalexin . - Completed course of empiric Rocephin  IV   Hypokalemia -normalized   Hyperlipidemia Lipid panel noted total cholesterol of 208 with LDL 136.  Patient unable to tolerate statins - Consider Leqvio or PCSK9 inhibitor when able   Hypothyroidism - Continue levothyroxine    HTN -on lasix  40mg  qday, metoprolol  tartrate 75mg  TID, nifedipine  60mg  every day PTA -Plan to gradually resume bp meds as tolerated and avoid over-correction of HTN -On metoprolol  25mg  BID and nifedipine  60mg . BP stable   Sinus tach -reviewed and noted on EKG -resolved         Consultants: Neurology Procedures performed:   Disposition: Skilled nursing facility  Outpatient Medications Prior to Visit  Medication Sig Dispense Refill   acetaminophen  (TYLENOL ) 500 MG tablet Take 500-1,000 mg by mouth every 6 (six) hours as needed for moderate pain (pain score 4-6).     aspirin  EC 81 MG tablet Take 1 tablet (81 mg total) by mouth 2 (two) times daily.     Cholecalciferol 1000 UNITS tablet Take 1,000 Units by mouth daily.     clopidogrel  (PLAVIX ) 75 MG tablet Take 1 tablet (75 mg total) by mouth daily.     clorazepate  (TRANXENE ) 15 MG tablet Take 1 tablet (15 mg total) by mouth 3 (three) times daily as needed (anxiety). 5 tablet 0   diphenoxylate -atropine  (LOMOTIL ) 2.5-0.025 MG tablet Take 1 tablet by mouth 4 (four) times daily as needed for diarrhea or loose stools. 40 tablet 1   doxepin  (SINEQUAN ) 50 MG  capsule Take 3 capsules (150 mg total) by mouth at bedtime. 90 capsule 3   levothyroxine  (SYNTHROID ) 100 MCG tablet Start Levothyroxine  100 mcg every other day and 50 mcg every other day. 90 tablet 3   metoprolol  tartrate (LOPRESSOR ) 50 MG tablet Take 1 tablet (50 mg total) by mouth 2 (two) times daily.     NIFEdipine  (PROCARDIA  XL) 60 MG 24 hr tablet Take 1 tablet (60 mg total) by mouth daily. 90 tablet 3   No facility-administered medications prior to visit.    ROS: Review of Systems  Constitutional:  Positive for fatigue. Negative for activity change, appetite change, chills and unexpected weight change.  HENT:  Negative for congestion, mouth sores and sinus pressure.   Eyes:  Negative for visual disturbance.  Respiratory:  Negative for cough and chest tightness.   Gastrointestinal:  Negative for abdominal pain and nausea.  Genitourinary:  Positive for frequency. Negative for difficulty urinating and vaginal pain.  Musculoskeletal:  Positive for arthralgias and gait problem. Negative for back pain.  Skin:  Negative for pallor and rash.  Neurological:  Positive for weakness. Negative for dizziness,  tremors, numbness and headaches.  Psychiatric/Behavioral:  Positive for dysphoric mood and sleep disturbance. Negative for confusion and decreased concentration. The patient is nervous/anxious.     Objective:  BP (!) 162/84   Pulse 72   Ht 5' 6 (1.676 m)   SpO2 98%   BMI 24.21 kg/m   BP Readings from Last 3 Encounters:  05/07/24 (!) 162/84  04/07/24 134/74  03/12/24 102/62    Wt Readings from Last 3 Encounters:  03/31/24 150 lb (68 kg)  03/12/24 158 lb 3.2 oz (71.8 kg)  02/13/24 156 lb 6.4 oz (70.9 kg)    Physical Exam Constitutional:      General: She is not in acute distress.    Appearance: She is well-developed.  HENT:     Head: Normocephalic.     Right Ear: External ear normal.     Left Ear: External ear normal.     Nose: Nose normal.  Eyes:     General:         Right eye: No discharge.        Left eye: No discharge.     Conjunctiva/sclera: Conjunctivae normal.     Pupils: Pupils are equal, round, and reactive to light.  Neck:     Thyroid : No thyromegaly.     Vascular: No JVD.     Trachea: No tracheal deviation.  Cardiovascular:     Rate and Rhythm: Normal rate and regular rhythm.     Heart sounds: Normal heart sounds.  Pulmonary:     Effort: No respiratory distress.     Breath sounds: No stridor. No wheezing.  Abdominal:     General: Bowel sounds are normal. There is no distension.     Palpations: Abdomen is soft. There is no mass.     Tenderness: There is no abdominal tenderness. There is no guarding or rebound.  Musculoskeletal:        General: Tenderness present.     Cervical back: Normal range of motion and neck supple. No rigidity.     Right lower leg: Edema present.     Left lower leg: No edema.  Lymphadenopathy:     Cervical: No cervical adenopathy.  Skin:    Findings: No erythema or rash.  Neurological:     Mental Status: She is oriented to person, place, and time.     Cranial Nerves: No cranial nerve deficit.     Motor: No abnormal muscle tone.     Coordination: Coordination normal.     Deep Tendon Reflexes: Reflexes normal.  Psychiatric:        Behavior: Behavior normal.        Thought Content: Thought content normal.        Judgment: Judgment normal.   R foot w/swelling 2+/pain, L foot 1+ In a w/c R foot is painful and swollen  Lab Results  Component Value Date   WBC 9.7 04/07/2024   HGB 11.7 (L) 04/07/2024   HCT 35.8 (L) 04/07/2024   PLT 359 04/07/2024   GLUCOSE 88 04/07/2024   CHOL 208 (H) 04/01/2024   TRIG 130 04/01/2024   HDL 46 04/01/2024   LDLDIRECT 188.2 01/10/2012   LDLCALC 136 (H) 04/01/2024   ALT 9 04/07/2024   AST 18 04/07/2024   NA 141 04/07/2024   K 3.9 04/07/2024   CL 109 04/07/2024   CREATININE 1.29 (H) 04/07/2024   BUN 15 04/07/2024   CO2 23 04/07/2024   TSH 0.719 03/31/2024   INR 1.0  04/01/2024   HGBA1C 5.3 04/01/2024    VAS US  LOWER EXTREMITY VENOUS (DVT) Result Date: 04/01/2024  Lower Venous DVT Study Patient Name:  BRIANDA BEITLER [facility]  Date of Exam:   04/01/2024 Medical Rec #: [id number]     Accession #:    [id number] Date of Birth: [birth year]      Patient Gender: F Patient Age:   43 years Exam Location:  [facility] Procedure:      VAS US  LOWER EXTREMITY VENOUS (DVT) Referring Phys: ARY XU --------------------------------------------------------------------------------  Indications: Stroke.  Comparison Study: Previous exam on 08/30/2023 ([facility]LEV) was negative for DVT Performing Technologist: Ezzie Potters RVT, RDMS  Examination Guidelines: A complete evaluation includes B-mode imaging, spectral Doppler, color Doppler, and power Doppler as needed of all accessible portions of each vessel. Bilateral testing is considered an integral part of a complete examination. Limited examinations for reoccurring indications may be performed as noted. The reflux portion of the exam is performed with the patient in reverse Trendelenburg.  +---------+---------------+---------+-----------+----------+--------------+ RIGHT    CompressibilityPhasicitySpontaneityPropertiesThrombus Aging +---------+---------------+---------+-----------+----------+--------------+ CFV      Full           Yes      Yes                                 +---------+---------------+---------+-----------+----------+--------------+ SFJ      Full                                                        +---------+---------------+---------+-----------+----------+--------------+ FV Prox  Full           Yes      Yes                                 +---------+---------------+---------+-----------+----------+--------------+ FV Mid   Full           Yes      Yes                                 +---------+---------------+---------+-----------+----------+--------------+ FV DistalFull           Yes      Yes                                  +---------+---------------+---------+-----------+----------+--------------+ PFV      Full                                                        +---------+---------------+---------+-----------+----------+--------------+ POP      Full           Yes      Yes                                 +---------+---------------+---------+-----------+----------+--------------+ PTV      Full                                                        +---------+---------------+---------+-----------+----------+--------------+  PERO     Full                                                        +---------+---------------+---------+-----------+----------+--------------+   +---------+---------------+---------+-----------+----------+--------------+ LEFT     CompressibilityPhasicitySpontaneityPropertiesThrombus Aging +---------+---------------+---------+-----------+----------+--------------+ CFV      Full           Yes      Yes                                 +---------+---------------+---------+-----------+----------+--------------+ SFJ      Full                                                        +---------+---------------+---------+-----------+----------+--------------+ FV Prox  Full           Yes      Yes                                 +---------+---------------+---------+-----------+----------+--------------+ FV Mid   Full           Yes      Yes                                 +---------+---------------+---------+-----------+----------+--------------+ FV DistalFull           Yes      Yes                                 +---------+---------------+---------+-----------+----------+--------------+ PFV      Full                                                        +---------+---------------+---------+-----------+----------+--------------+ POP      Full           Yes      Yes                                  +---------+---------------+---------+-----------+----------+--------------+ PTV      Full                                                        +---------+---------------+---------+-----------+----------+--------------+ PERO     Full                                                        +---------+---------------+---------+-----------+----------+--------------+  Summary: BILATERAL: - No evidence of deep vein thrombosis seen in the lower extremities, bilaterally. -No evidence of popliteal cyst, bilaterally.   *See table(s) above for measurements and observations. Electronically signed by Debby Robertson on 04/01/2024 at 4:50:42 PM.    Final    ECHOCARDIOGRAM COMPLETE Result Date: 04/01/2024    ECHOCARDIOGRAM REPORT   Patient Name:   JAZMINN POMALES [facility] Date of Exam: 04/01/2024 Medical Rec #:  [id number]    Height:       66.0 in Accession #:    [id number]   Weight:       150.0 lb Date of Birth:  December 01, 1945     BSA:          1.770 m Patient Age:    78 years     BP:           170/90 mmHg Patient Gender: F            HR:           90 bpm. Exam Location:  Inpatient Procedure: 2D Echo, Cardiac Doppler and Color Doppler (Both Spectral and Color            Flow Doppler were utilized during procedure). Indications:    Sroke 163.9  History:        Patient has prior history of Echocardiogram examinations, most                 recent 11/16/2016. CAD; Risk Factors:Dyslipidemia.  Sonographer:    Merlynn Argyle Referring Phys: [id number] [facility] IMPRESSIONS  1. Left ventricular ejection fraction, by estimation, is 60 to 65%. The left ventricle has normal function. The left ventricle has no regional wall motion abnormalities. Left ventricular diastolic parameters are consistent with Grade I diastolic dysfunction (impaired relaxation).  2. Right ventricular systolic function is normal. The right ventricular size is normal. There is normal pulmonary artery systolic pressure.  3. The mitral valve is normal in structure.  No evidence of mitral valve regurgitation. No evidence of mitral stenosis.  4. The aortic valve is normal in structure. Aortic valve regurgitation is not visualized. No aortic stenosis is present.  5. The inferior vena cava is normal in size with greater than 50% respiratory variability, suggesting right atrial pressure of 3 mmHg. FINDINGS  Left Ventricle: Left ventricular ejection fraction, by estimation, is 60 to 65%. The left ventricle has normal function. The left ventricle has no regional wall motion abnormalities. The left ventricular internal cavity size was normal in size. There is  no left ventricular hypertrophy. Left ventricular diastolic parameters are consistent with Grade I diastolic dysfunction (impaired relaxation). Right Ventricle: The right ventricular size is normal. No increase in right ventricular wall thickness. Right ventricular systolic function is normal. There is normal pulmonary artery systolic pressure. The tricuspid regurgitant velocity is 2.70 m/s, and  with an assumed right atrial pressure of 3 mmHg, the estimated right ventricular systolic pressure is 32.2 mmHg. Left Atrium: Left atrial size was normal in size. Right Atrium: Right atrial size was normal in size. Pericardium: There is no evidence of pericardial effusion. Mitral Valve: The mitral valve is normal in structure. No evidence of mitral valve regurgitation. No evidence of mitral valve stenosis. Tricuspid Valve: The tricuspid valve is normal in structure. Tricuspid valve regurgitation is mild . No evidence of tricuspid stenosis. Aortic Valve: The aortic valve is normal in structure. Aortic valve regurgitation is not visualized. No aortic stenosis is present. Pulmonic Valve: The pulmonic valve was normal in  structure. Pulmonic valve regurgitation is not visualized. No evidence of pulmonic stenosis. Aorta: The aortic root is normal in size and structure. Venous: The inferior vena cava is normal in size with greater than 50%  respiratory variability, suggesting right atrial pressure of 3 mmHg. IAS/Shunts: No atrial level shunt detected by color flow Doppler.  LEFT VENTRICLE PLAX 2D LVIDd:         3.60 cm   Diastology LVIDs:         2.40 cm   LV e' medial:    6.42 cm/s LV PW:         1.00 cm   LV E/e' medial:  13.8 LV IVS:        1.00 cm   LV e' lateral:   7.51 cm/s LVOT diam:     1.70 cm   LV E/e' lateral: 11.8 LV SV:         37 LV SV Index:   21 LVOT Area:     2.27 cm LV IVRT:       116 msec  RIGHT VENTRICLE             IVC RV Basal diam:  3.40 cm     IVC diam: 1.70 cm RV S prime:     13.20 cm/s TAPSE (M-mode): 1.7 cm      PULMONARY VEINS                             Diastolic Velocity: 33.80 cm/s                             S/D Velocity:       1.50                             Systolic Velocity:  51.80 cm/s LEFT ATRIUM             Index        RIGHT ATRIUM           Index LA diam:        2.80 cm 1.58 cm/m   RA Area:     16.90 cm LA Vol (A2C):   58.4 ml 33.00 ml/m  RA Volume:   43.40 ml  24.52 ml/m LA Vol (A4C):   47.1 ml 26.62 ml/m LA Biplane Vol: 53.3 ml 30.12 ml/m  AORTIC VALVE LVOT Vmax:   81.10 cm/s LVOT Vmean:  60.900 cm/s LVOT VTI:    0.163 m  AORTA Ao Root diam: 3.00 cm Ao Asc diam:  3.20 cm MITRAL VALVE                TRICUSPID VALVE MV Area (PHT): 2.08 cm     TR Peak grad:   29.2 mmHg MV Decel Time: 365 msec     TR Vmax:        270.00 cm/s MV E velocity: 88.70 cm/s MV A velocity: 109.00 cm/s  SHUNTS MV E/A ratio:  0.81         Systemic VTI:  0.16 m                             Systemic Diam: 1.70 cm Morene Brownie Electronically signed by Morene Brownie Signature Date/Time: 04/01/2024/4:26:10 PM    Final    MR BRAIN WO  CONTRAST Result Date: 04/01/2024 EXAM: MRI BRAIN WITHOUT CONTRAST 04/01/2024 05:19:14 AM TECHNIQUE: Multiplanar multisequence MRI of the head/brain was performed without the administration of intravenous contrast. COMPARISON: CT of the head dated 03/31/2024. CLINICAL HISTORY: Neuro deficit, acute,  stroke suspected. FINDINGS: BRAIN AND VENTRICLES: There are numerous foci of restricted diffusion present within the subcortical and periventricular white matter of the right frontal and parietal lobes. There are also a few punctate foci of probable restricted diffusion in the left occipital lobe and posteromedially within the right cerebellar hemisphere. There is age-related cerebral volume loss. There are confluence zones of increased T2 signal in the periventricular and deep cerebral white matter. No intracranial hemorrhage. No mass. No midline shift. No hydrocephalus. The sella is unremarkable. Normal flow voids. ORBITS: The patient is status post bilateral lens replacement. SINUSES AND MASTOIDS: There is moderate mucosal disease within the ethmoid and sphenoid sinuses. BONES AND SOFT TISSUES: Normal marrow signal. No acute soft tissue abnormality. IMPRESSION: 1. Numerous foci of restricted diffusion in the subcortical and periventricular white matter of the right frontal and parietal lobes, consistent with acute infarcts. 2. Additional punctate foci of probable restricted diffusion in the left occipital lobe and posteromedially within the right cerebellar hemisphere. 3. No evidence of hemorrhage. Electronically signed by: Evalene Coho MD 04/01/2024 05:42 AM EST RP Workstation: [id number]   DG Knee Complete 4 Views Right Result Date: 04/01/2024 EXAM: 4 VIEW(S) XRAY OF THE RIGHT KNEE 04/01/2024 02:34:46 AM COMPARISON: None available. CLINICAL HISTORY: fall FINDINGS: BONES AND JOINTS: No acute fracture. No focal osseous lesion. No joint dislocation. Small suprapatellar knee joint effusion. Mild tricompartmental degenerative changes, most prominent in the lateral and patellofemoral compartments. SOFT TISSUES: The soft tissues are unremarkable. IMPRESSION: 1. No acute fracture or dislocation. 2. Mild degenerative changes. 3. Small suprapatellar knee joint effusion. Electronically signed by: Pinkie Pebbles  MD 04/01/2024 02:38 AM EST RP Workstation: [id number]   DG Hip Unilat W or Wo Pelvis 2-3 Views Right Result Date: 04/01/2024 EXAM: 3 VIEW(S) XRAY OF THE RIGHT HIP 04/01/2024 02:34:46 AM COMPARISON: None available. CLINICAL HISTORY: fall FINDINGS: BONES AND JOINTS: No acute fracture or focal osseous lesion. The hip joint is maintained. Mild degenerative changes of the right hip. SOFT TISSUES: The soft tissues are unremarkable. IMPRESSION: 1. No acute fracture or dislocation. Electronically signed by: Pinkie Pebbles MD 04/01/2024 02:38 AM EST RP Workstation: [id number]   CT Head Wo Contrast Result Date: 03/31/2024 EXAM: CT HEAD WITHOUT CONTRAST 03/31/2024 08:31:01 PM TECHNIQUE: CT of the head was performed without the administration of intravenous contrast. Automated exposure control, iterative reconstruction, and/or weight based adjustment of the mA/kV was utilized to reduce the radiation dose to as low as reasonably achievable. COMPARISON: 03/15/2023 CLINICAL HISTORY: fall, hit back of head FINDINGS: BRAIN AND VENTRICLES: No acute hemorrhage. No evidence of acute infarct. No extra-axial collection. No mass effect or midline shift. Moderate patchy supratentorial white matter hypodensities. Mild global atrophy. ORBITS: No acute abnormality. SINUSES: Chronic opacification of left sphenoid sinus. Mild paranasal sinus mucosal thickening. SOFT TISSUES AND SKULL: No acute soft tissue abnormality. No skull fracture. IMPRESSION: 1. No acute intracranial abnormality related to the fall. 2. Moderate supratentorial white matter hypodensities, likely chronic microvascular ischemic changes, and mild global atrophy. Electronically signed by: Oneil Devonshire MD 03/31/2024 08:33 PM EST RP Workstation: [id number]    Assessment & Plan:   Problem List Items Addressed This Visit     Anxiety disorder   Machelle decided not to undergo any breast cancer treatments offered to  her...        Dry mouth   Discussed; we reviewed  her meds together      Dyslipidemia   Statin intolerant      Edema   Change Furosemide  to 80 mg q am (not 40 mg bid)      Essential hypertension   Blood pressure seems to be reasonably well-controlled.  Continue Procardia  XL      Relevant Medications   furosemide  (LASIX ) 40 MG tablet   Gait disorder   Worse post-CVA May need a rollator walker      Relevant Orders   Comprehensive metabolic panel with GFR   Uric acid   Urinalysis   CBC with Differential/Platelet   Lipid panel   Gout   Medrol  dose pack as needed Off Allopurinol   Colchicine        Relevant Orders   Comprehensive metabolic panel with GFR   Uric acid   Urinalysis   CBC with Differential/Platelet   Lipid panel   Hip pain, chronic   R hip troch bursitis -      Relevant Medications   predniSONE  (DELTASONE ) 10 MG tablet   History of lacunar cerebrovascular accident (CVA) - Primary   03/2024 She has experienced multiple falls, approximately five or six times over the last couple days, due to her knees not supporting her. Patient presents after having multiple falls.  Initial CT did not note any acute abnormality.  Patient was not a candidate for thrombolytics due to being outside the window.  MRI revealed numerous infarcts in the subcortical and periventricular white matter of the right frontal and parietal lobes consistent with acute infarcts. - 2d echo reviewed. Normal LVEF with no atrial level shunt - therapy recs for inpt rehab, pending dispo - Aspirin  and Plavix  recommended by Neurology x 3 mos then plavix  alone - Recs for 30 day monitor at discharge, however per Neurology, pt has concerns for out of pocket cost and wishes to focus only on interventions that increase quality of life at this time   Medical-Foldable-Rollator-Lightweight -- $199      Hypothyroidism    Continue with levothyroxine       Relevant Orders   Comprehensive metabolic panel with GFR   Uric acid   Urinalysis   CBC with  Differential/Platelet   Lipid panel   Stage 3a chronic kidney disease (HCC)   Hydrate well Continue to monitor GFR      Relevant Orders   Comprehensive metabolic panel with GFR   Uric acid   Urinalysis   CBC with Differential/Platelet   Lipid panel      Meds ordered this encounter  Medications   colchicine  0.6 MG tablet    Sig: Take two tablets prn gout attack.Then take another one in 1-2 hrs. Do not repeat for 3 days.    Dispense:  18 tablet    Refill:  2   predniSONE  (DELTASONE ) 10 MG tablet    Sig: Prednisone  10 mg: take 4 tabs a day x 3 days; then 3 tabs a day x 4 days; then 2 tabs a day x 4 days, then 1 tab a day x 6 days, then stop. Take pc.    Dispense:  38 tablet    Refill:  1   furosemide  (LASIX ) 40 MG tablet    Sig: Take 2 tablets (80 mg total) by mouth daily as needed for edema.    Dispense:  60 tablet    Refill:  11  Follow-up: Return in about 4 weeks (around 06/04/2024) for a follow-up visit.  Marolyn Noel, MD

## 2024-05-07 NOTE — Assessment & Plan Note (Signed)
 Change Furosemide  to 80 mg q am (not 40 mg bid)

## 2024-05-07 NOTE — Assessment & Plan Note (Signed)
Blood pressure seems to be reasonably well-controlled.  Continue Procardia XL

## 2024-05-07 NOTE — Assessment & Plan Note (Signed)
 Marry decided not to undergo any breast cancer treatments offered to her.SABRASABRA

## 2024-05-07 NOTE — Assessment & Plan Note (Addendum)
 Discussed; we reviewed her meds together

## 2024-05-07 NOTE — Assessment & Plan Note (Signed)
 R hip troch bursitis -

## 2024-05-07 NOTE — Assessment & Plan Note (Signed)
 Statin intolerant

## 2024-05-07 NOTE — Assessment & Plan Note (Signed)
 Continue with levothyroxine

## 2024-05-07 NOTE — Assessment & Plan Note (Signed)
 Medrol  dose pack as needed Off Allopurinol   Colchicine 

## 2024-05-07 NOTE — Assessment & Plan Note (Signed)
 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

## 2024-05-07 NOTE — Assessment & Plan Note (Addendum)
 03/2024 She has experienced multiple falls, approximately five or six times over the last couple days, due to her knees not supporting her. Patient presents after having multiple falls.  Initial CT did not note any acute abnormality.  Patient was not a candidate for thrombolytics due to being outside the window.  MRI revealed numerous infarcts in the subcortical and periventricular white matter of the right frontal and parietal lobes consistent with acute infarcts. - 2d echo reviewed. Normal LVEF with no atrial level shunt - therapy recs for inpt rehab, pending dispo - Aspirin  and Plavix  recommended by Neurology x 3 mos then plavix  alone - Recs for 30 day monitor at discharge, however per Neurology, pt has concerns for out of pocket cost and wishes to focus only on interventions that increase quality of life at this time   Medical-Foldable-Rollator-Lightweight -- $199

## 2024-05-08 LAB — LIPID PANEL
Cholesterol: 206 mg/dL — ABNORMAL HIGH (ref 28–200)
HDL: 60.9 mg/dL
LDL Cholesterol: 118 mg/dL — ABNORMAL HIGH (ref 10–99)
NonHDL: 145.24
Total CHOL/HDL Ratio: 3
Triglycerides: 138 mg/dL (ref 10.0–149.0)
VLDL: 27.6 mg/dL (ref 0.0–40.0)

## 2024-05-08 LAB — URINALYSIS, ROUTINE W REFLEX MICROSCOPIC
Bilirubin Urine: NEGATIVE
Ketones, ur: NEGATIVE
Nitrite: NEGATIVE
Specific Gravity, Urine: 1.01 (ref 1.000–1.030)
Urine Glucose: NEGATIVE
Urobilinogen, UA: 0.2 (ref 0.0–1.0)
pH: 7 (ref 5.0–8.0)

## 2024-05-08 LAB — COMPREHENSIVE METABOLIC PANEL WITH GFR
ALT: 8 U/L (ref 3–35)
AST: 13 U/L (ref 5–37)
Albumin: 3.6 g/dL (ref 3.5–5.2)
Alkaline Phosphatase: 80 U/L (ref 39–117)
BUN: 16 mg/dL (ref 6–23)
CO2: 31 meq/L (ref 19–32)
Calcium: 9.2 mg/dL (ref 8.4–10.5)
Chloride: 101 meq/L (ref 96–112)
Creatinine, Ser: 1.5 mg/dL — ABNORMAL HIGH (ref 0.40–1.20)
GFR: 32.66 mL/min — ABNORMAL LOW
Glucose, Bld: 148 mg/dL — ABNORMAL HIGH (ref 70–99)
Potassium: 3.9 meq/L (ref 3.5–5.1)
Sodium: 141 meq/L (ref 135–145)
Total Bilirubin: 0.4 mg/dL (ref 0.2–1.2)
Total Protein: 6.8 g/dL (ref 6.0–8.3)

## 2024-05-08 LAB — CBC WITH DIFFERENTIAL/PLATELET
Basophils Absolute: 0.1 K/uL (ref 0.0–0.1)
Basophils Relative: 0.5 % (ref 0.0–3.0)
Eosinophils Absolute: 0.2 K/uL (ref 0.0–0.7)
Eosinophils Relative: 1.8 % (ref 0.0–5.0)
HCT: 33 % — ABNORMAL LOW (ref 36.0–46.0)
Hemoglobin: 10.8 g/dL — ABNORMAL LOW (ref 12.0–15.0)
Lymphocytes Relative: 12.9 % (ref 12.0–46.0)
Lymphs Abs: 1.5 K/uL (ref 0.7–4.0)
MCHC: 32.8 g/dL (ref 30.0–36.0)
MCV: 95.4 fl (ref 78.0–100.0)
Monocytes Absolute: 0.5 K/uL (ref 0.1–1.0)
Monocytes Relative: 4.5 % (ref 3.0–12.0)
Neutro Abs: 9.3 K/uL — ABNORMAL HIGH (ref 1.4–7.7)
Neutrophils Relative %: 80.3 % — ABNORMAL HIGH (ref 43.0–77.0)
Platelets: 346 K/uL (ref 150.0–400.0)
RBC: 3.46 Mil/uL — ABNORMAL LOW (ref 3.87–5.11)
RDW: 13.5 % (ref 11.5–15.5)
WBC: 11.5 K/uL — ABNORMAL HIGH (ref 4.0–10.5)

## 2024-05-08 LAB — URIC ACID: Uric Acid, Serum: 11.6 mg/dL — ABNORMAL HIGH (ref 2.4–7.0)

## 2024-05-11 ENCOUNTER — Telehealth: Payer: Self-pay

## 2024-05-11 ENCOUNTER — Ambulatory Visit: Payer: Self-pay | Admitting: Internal Medicine

## 2024-05-11 ENCOUNTER — Other Ambulatory Visit: Payer: Self-pay | Admitting: Internal Medicine

## 2024-05-11 NOTE — Telephone Encounter (Signed)
 Copied from CRM #[id number]. Topic: Clinical - Lab/Test Results >> May 11, 2024 12:41 PM Tysheama G wrote: Reason for CRM: Patient calling for test results. She also had follow up questions for medication allopurinol  . Callback number [phone]

## 2024-05-12 ENCOUNTER — Telehealth: Payer: Self-pay

## 2024-05-12 NOTE — Telephone Encounter (Signed)
 Copied from CRM #[id number]. Topic: Clinical - Medication Question >> May 12, 2024 12:39 PM Thersia C wrote: Reason for CRM:  [facility] called in regarding patient prescription clopidogrel  (PLAVIX ) 75 MG tablet , wanted to know if she will be getting a refill on this and the status would like a callback   [phone]

## 2024-05-13 ENCOUNTER — Other Ambulatory Visit: Payer: Self-pay | Admitting: Family

## 2024-05-13 MED ORDER — CLOPIDOGREL BISULFATE 75 MG PO TABS: 75.0000 mg | ORAL_TABLET | Freq: Every day | ORAL | 1 refills | Status: AC

## 2024-05-15 NOTE — Telephone Encounter (Signed)
 Called and spoke with [name]. It seems like the discrepancy is due to what information they received from the rehab facility.  The discharge forms from rehab state that patient was to take plavix  and aspirin  mix for 3 months and then switch to the standalone plavix  after. They are going to fax us  over the reports to review as well. I tolder [name] I would reach out once I got a full answer

## 2024-05-19 ENCOUNTER — Telehealth: Payer: Self-pay

## 2024-05-19 NOTE — Telephone Encounter (Signed)
 The statement take plavix  and aspirin  mix for 3 months and then switch to the standalone plavix  after is correct. Thanks

## 2024-05-19 NOTE — Telephone Encounter (Signed)
 Copied from CRM [id number]. Topic: Clinical - Home Health Verbal Orders >> May 18, 2024  4:47 PM Sasha M wrote: Caller/Agency: [facility] Callback Number: [phone] Service Requested: Skilled Nursing Frequency: eval this week with follow up for frequency Any new concerns about the patient? No

## 2024-05-20 NOTE — Telephone Encounter (Signed)
 Copied from CRM [id number]. Topic: Clinical - Medical Advice >> May 20, 2024 10:48 AM Anairis L wrote: Reason for CRM: Shelli Kos [facility] calling with a few complaints from patient. [phone]  1.Doxepin  50mg  she been prescribe 150 mg and would perfer a 150 tabs instead of 3x 50-its costly for her.  2.Cough since christmas with increase nasal congestion and is refusing HH visit because she is not well enough to do it.   3.Patient wants to put a hold on [facility] until  05/30/2023  4. Wants a sooner app then 06/18/2024

## 2024-05-20 NOTE — Telephone Encounter (Signed)
 Okay.  Thanks.

## 2024-05-22 ENCOUNTER — Telehealth: Payer: Self-pay

## 2024-05-22 MED ORDER — PROMETHAZINE-DM 6.25-15 MG/5ML PO SYRP: 5.0000 mL | ORAL_SOLUTION | Freq: Four times a day (QID) | ORAL | 0 refills | Status: AC | PRN

## 2024-05-22 MED ORDER — DOXEPIN HCL 150 MG PO CAPS: 150.0000 mg | ORAL_CAPSULE | Freq: Every day | ORAL | 3 refills | Status: AC

## 2024-05-22 NOTE — Telephone Encounter (Signed)
 Ok Prom cough syr Rx [place] Thx

## 2024-05-22 NOTE — Addendum Note (Signed)
 Addended by: Corleone Biegler V on: 05/22/2024 08:07 AM   Modules accepted: Orders

## 2024-05-22 NOTE — Telephone Encounter (Signed)
 Copied from CRM #[id number]. Topic: General - Other >> May 20, 2024 12:58 PM Nessti S wrote: Reason for CRM: pt is missing PT this week.

## 2024-05-25 ENCOUNTER — Ambulatory Visit: Payer: Self-pay | Admitting: *Deleted

## 2024-05-25 ENCOUNTER — Telehealth: Payer: Self-pay

## 2024-05-25 NOTE — Telephone Encounter (Signed)
 Copied from CRM [id number]. Topic: Appointments - Scheduling Inquiry for Clinic >> May 25, 2024  8:16 AM Shasta CROME wrote: Reason for CRM: Patient would like to see Dr. Garald sooner then 06/03/2024, she has a chest cold.She claims someone called her to come in sooner.

## 2024-05-25 NOTE — Telephone Encounter (Signed)
 FYI Only or Action Required?: FYI only for provider: appointment scheduled on 05/26/24.  Patient was last seen in primary care on 05/07/2024 by Plotnikov, Karlynn GAILS, MD.  Called Nurse Triage reporting Cough.  Symptoms began a week ago.  Interventions attempted: OTC medications: gaviscon.  Symptoms are: unchanged.  Triage Disposition: See Physician Within 24 Hours  Patient/caregiver understands and will follow disposition?: Yes  Copied from CRM #[id number]. Topic: Clinical - Red Word Triage >> May 25, 2024 11:14 AM Kevelyn M wrote: 1 week of cough and congestion/cold, but not productive. Patient has been wheezing. Reason for Disposition  [1] Continuous (nonstop) coughing interferes with work or school AND [2] no improvement using cough treatment per Care Advice  Answer Assessment - Initial Assessment Questions 1. ONSET: When did the cough begin?      1 week 2. SEVERITY: How bad is the cough today?      Patient does not cough unless she exerts herself 3. SPUTUM: Describe the color of your sputum (e.g., none, dry cough; clear, white, yellow, green)     No lung sputum, nasal congestion - white 4. HEMOPTYSIS: Are you coughing up any blood? If Yes, ask: How much? (e.g., flecks, streaks, tablespoons, etc.)     no 5. DIFFICULTY BREATHING: Are you having difficulty breathing? If Yes, ask: How bad is it? (e.g., mild, moderate, severe)      normal 6. FEVER: Do you have a fever? If Yes, ask: What is your temperature, how was it measured, and when did it start?     no 7. CARDIAC HISTORY: Do you have any history of heart disease? (e.g., heart attack, congestive heart failure)      Hx heart attack 8. LUNG HISTORY: Do you have any history of lung disease?  (e.g., pulmonary embolus, asthma, emphysema)     no 9. PE RISK FACTORS: Do you have a history of blood clots? (or: recent major surgery, recent prolonged travel, bedridden)     *No Answer* 10. OTHER SYMPTOMS: Do you have  any other symptoms? (e.g., runny nose, wheezing, chest pain)       wheezing  Protocols used: Cough - Acute Non-Productive-A-AH

## 2024-05-26 ENCOUNTER — Ambulatory Visit: Admitting: Internal Medicine

## 2024-05-27 NOTE — Telephone Encounter (Signed)
 Patient currently holding off on HH continuation until 01/09

## 2024-06-02 ENCOUNTER — Telehealth: Payer: Self-pay

## 2024-06-02 NOTE — Telephone Encounter (Signed)
 Copied from CRM [id number]. Topic: Clinical - Home Health Verbal Orders >> Jun 02, 2024  4:59 PM Delon DASEN wrote: Caller/Agency: Leita with [facility] Callback Number: [phone] secure line Service Requested: Speech Therapy Frequency: tbd Any new concerns about the patient? Yes  - 3 falls over the weekend -bruises on bottom and hip, only complains of being sore, requesting social work eval and nursing eval and speech therapy

## 2024-06-03 NOTE — Telephone Encounter (Signed)
 OK speech therapy Thx

## 2024-06-03 NOTE — Telephone Encounter (Signed)
 I was able to give the verbal okay for Speech Therapy.

## 2024-06-17 ENCOUNTER — Other Ambulatory Visit: Payer: Self-pay | Admitting: Internal Medicine

## 2024-06-18 ENCOUNTER — Telehealth: Payer: Self-pay

## 2024-06-18 ENCOUNTER — Ambulatory Visit: Admitting: Internal Medicine

## 2024-06-18 NOTE — Telephone Encounter (Signed)
 Copied from CRM #[id number]. Topic: General - Other >> Jun 18, 2024  3:36 PM Ahlexyia S wrote: Reason for CRM: Physical Therapist Leita SQUIBB with [facility] called in wanting to inform clinic that they missed pt physical therapy this week and will continue as normal next week. Leita stated she is not requesting a call back, just informative.

## 2024-07-07 ENCOUNTER — Inpatient Hospital Stay: Admitting: Neurology

## 2024-07-20 ENCOUNTER — Ambulatory Visit: Admitting: Internal Medicine

## 2024-07-20 ENCOUNTER — Inpatient Hospital Stay: Admitting: Neurology

## 2024-09-29 ENCOUNTER — Inpatient Hospital Stay: Admitting: Neurology
